# Patient Record
Sex: Female | Born: 1970 | Race: White | Hispanic: No | Marital: Married | Smoking: Former smoker
Health system: Southern US, Community
[De-identification: ages and names within clinical notes are randomized; demographics above are authoritative.]

## PROBLEM LIST (undated history)

## (undated) DIAGNOSIS — L409 Psoriasis, unspecified: Secondary | ICD-10-CM

## (undated) DIAGNOSIS — F419 Anxiety disorder, unspecified: Secondary | ICD-10-CM

## (undated) DIAGNOSIS — M797 Fibromyalgia: Secondary | ICD-10-CM

## (undated) DIAGNOSIS — J45909 Unspecified asthma, uncomplicated: Secondary | ICD-10-CM

## (undated) DIAGNOSIS — L309 Dermatitis, unspecified: Secondary | ICD-10-CM

## (undated) DIAGNOSIS — J302 Other seasonal allergic rhinitis: Secondary | ICD-10-CM

## (undated) DIAGNOSIS — M542 Cervicalgia: Secondary | ICD-10-CM

## (undated) DIAGNOSIS — R112 Nausea with vomiting, unspecified: Secondary | ICD-10-CM

## (undated) DIAGNOSIS — I499 Cardiac arrhythmia, unspecified: Secondary | ICD-10-CM

## (undated) DIAGNOSIS — R519 Headache, unspecified: Secondary | ICD-10-CM

## (undated) DIAGNOSIS — Z9889 Other specified postprocedural states: Secondary | ICD-10-CM

## (undated) DIAGNOSIS — F32A Depression, unspecified: Secondary | ICD-10-CM

## (undated) DIAGNOSIS — R1310 Dysphagia, unspecified: Secondary | ICD-10-CM

## (undated) DIAGNOSIS — M818 Other osteoporosis without current pathological fracture: Secondary | ICD-10-CM

## (undated) DIAGNOSIS — K219 Gastro-esophageal reflux disease without esophagitis: Secondary | ICD-10-CM

## (undated) DIAGNOSIS — G4730 Sleep apnea, unspecified: Secondary | ICD-10-CM

## (undated) DIAGNOSIS — F909 Attention-deficit hyperactivity disorder, unspecified type: Secondary | ICD-10-CM

## (undated) DIAGNOSIS — D649 Anemia, unspecified: Secondary | ICD-10-CM

## (undated) DIAGNOSIS — R12 Heartburn: Secondary | ICD-10-CM

## (undated) DIAGNOSIS — D219 Benign neoplasm of connective and other soft tissue, unspecified: Secondary | ICD-10-CM

## (undated) DIAGNOSIS — M5481 Occipital neuralgia: Secondary | ICD-10-CM

## (undated) DIAGNOSIS — G43909 Migraine, unspecified, not intractable, without status migrainosus: Secondary | ICD-10-CM

## (undated) DIAGNOSIS — G901 Familial dysautonomia [Riley-Day]: Secondary | ICD-10-CM

## (undated) DIAGNOSIS — C4491 Basal cell carcinoma of skin, unspecified: Secondary | ICD-10-CM

## (undated) DIAGNOSIS — E785 Hyperlipidemia, unspecified: Secondary | ICD-10-CM

## (undated) DIAGNOSIS — M810 Age-related osteoporosis without current pathological fracture: Secondary | ICD-10-CM

## (undated) DIAGNOSIS — M1990 Unspecified osteoarthritis, unspecified site: Secondary | ICD-10-CM

## (undated) DIAGNOSIS — Z1371 Encounter for nonprocreative screening for genetic disease carrier status: Secondary | ICD-10-CM

## (undated) DIAGNOSIS — Z800 Family history of malignant neoplasm of digestive organs: Secondary | ICD-10-CM

## (undated) DIAGNOSIS — G609 Hereditary and idiopathic neuropathy, unspecified: Secondary | ICD-10-CM

## (undated) DIAGNOSIS — C189 Malignant neoplasm of colon, unspecified: Secondary | ICD-10-CM

## (undated) DIAGNOSIS — T7840XA Allergy, unspecified, initial encounter: Secondary | ICD-10-CM

## (undated) DIAGNOSIS — D8940 Mast cell activation, unspecified: Secondary | ICD-10-CM

## (undated) DIAGNOSIS — Q7962 Hypermobile Ehlers-Danlos syndrome: Secondary | ICD-10-CM

## (undated) HISTORY — PX: BREAST SURGERY: SHX581

## (undated) HISTORY — DX: Sleep apnea, unspecified: G47.30

## (undated) HISTORY — DX: Unspecified asthma, uncomplicated: J45.909

## (undated) HISTORY — DX: Cervicalgia: M54.2

## (undated) HISTORY — DX: Anxiety disorder, unspecified: F41.9

## (undated) HISTORY — DX: Allergy, unspecified, initial encounter: T78.40XA

## (undated) HISTORY — DX: Family history of malignant neoplasm of digestive organs: Z80.0

## (undated) HISTORY — DX: Encounter for nonprocreative screening for genetic disease carrier status: Z13.71

## (undated) HISTORY — DX: Malignant neoplasm of colon, unspecified: C18.9

## (undated) HISTORY — PX: ENDOMETRIAL ABLATION: SHX621

## (undated) HISTORY — DX: Migraine, unspecified, not intractable, without status migrainosus: G43.909

## (undated) HISTORY — DX: Mast cell activation, unspecified: D89.40

## (undated) HISTORY — DX: Anemia, unspecified: D64.9

## (undated) HISTORY — DX: Hyperlipidemia, unspecified: E78.5

## (undated) HISTORY — DX: Basal cell carcinoma of skin, unspecified: C44.91

## (undated) HISTORY — DX: Unspecified osteoarthritis, unspecified site: M19.90

## (undated) HISTORY — DX: Age-related osteoporosis without current pathological fracture: M81.0

## (undated) HISTORY — PX: APPENDECTOMY: SHX54

## (undated) HISTORY — DX: Familial dysautonomia (riley-day): G90.1

## (undated) HISTORY — DX: Depression, unspecified: F32.A

## (undated) HISTORY — DX: Benign neoplasm of connective and other soft tissue, unspecified: D21.9

## (undated) HISTORY — DX: Gastro-esophageal reflux disease without esophagitis: K21.9

## (undated) HISTORY — DX: Hypermobile Ehlers-Danlos syndrome: Q79.62

## (undated) HISTORY — DX: Hereditary and idiopathic neuropathy, unspecified: G60.9

## (undated) HISTORY — PX: OTHER SURGICAL HISTORY: SHX169

## (undated) HISTORY — DX: Occipital neuralgia: M54.81

## (undated) HISTORY — DX: Other osteoporosis without current pathological fracture: M81.8

## (undated) HISTORY — DX: Nausea with vomiting, unspecified: R11.2

## (undated) HISTORY — DX: Attention-deficit hyperactivity disorder, unspecified type: F90.9

## (undated) HISTORY — PX: APPENDECTOMY (OPEN): SHX54

## (undated) HISTORY — DX: Other seasonal allergic rhinitis: J30.2

## (undated) HISTORY — DX: Fibromyalgia: M79.7

---

## 1988-09-15 DIAGNOSIS — J45909 Unspecified asthma, uncomplicated: Secondary | ICD-10-CM | POA: Insufficient documentation

## 1988-09-15 DIAGNOSIS — R0600 Dyspnea, unspecified: Secondary | ICD-10-CM | POA: Insufficient documentation

## 1998-09-15 DIAGNOSIS — M791 Myalgia, unspecified site: Secondary | ICD-10-CM | POA: Insufficient documentation

## 1998-09-15 DIAGNOSIS — M2550 Pain in unspecified joint: Secondary | ICD-10-CM | POA: Insufficient documentation

## 2000-09-15 DIAGNOSIS — R5383 Other fatigue: Secondary | ICD-10-CM | POA: Insufficient documentation

## 2000-09-15 DIAGNOSIS — R5382 Chronic fatigue, unspecified: Secondary | ICD-10-CM | POA: Insufficient documentation

## 2000-10-14 ENCOUNTER — Other Ambulatory Visit (HOSPITAL_COMMUNITY): Admission: RE | Admit: 2000-10-14 | Payer: 59 | Source: Ambulatory Visit

## 2003-09-16 DIAGNOSIS — I498 Other specified cardiac arrhythmias: Secondary | ICD-10-CM | POA: Insufficient documentation

## 2003-09-16 DIAGNOSIS — G90A Postural orthostatic tachycardia syndrome (POTS): Secondary | ICD-10-CM | POA: Insufficient documentation

## 2004-09-15 HISTORY — PX: REDUCTION MAMMAPLASTY: SUR839

## 2005-09-15 DIAGNOSIS — C4490 Unspecified malignant neoplasm of skin, unspecified: Secondary | ICD-10-CM

## 2005-09-15 DIAGNOSIS — M25619 Stiffness of unspecified shoulder, not elsewhere classified: Secondary | ICD-10-CM | POA: Insufficient documentation

## 2005-09-15 DIAGNOSIS — M719 Bursopathy, unspecified: Secondary | ICD-10-CM | POA: Insufficient documentation

## 2005-09-15 HISTORY — DX: Unspecified malignant neoplasm of skin, unspecified: C44.90

## 2006-09-03 DIAGNOSIS — C4491 Basal cell carcinoma of skin, unspecified: Secondary | ICD-10-CM

## 2006-09-03 HISTORY — DX: Basal cell carcinoma of skin, unspecified: C44.91

## 2006-09-15 DIAGNOSIS — M3500 Sicca syndrome, unspecified: Secondary | ICD-10-CM | POA: Insufficient documentation

## 2006-09-15 HISTORY — PX: COLON SURGERY: SHX602

## 2007-09-16 DIAGNOSIS — C189 Malignant neoplasm of colon, unspecified: Secondary | ICD-10-CM

## 2007-09-16 HISTORY — DX: Malignant neoplasm of colon, unspecified: C18.9

## 2007-09-16 HISTORY — PX: COLECTOMY: SHX59

## 2009-05-28 DIAGNOSIS — D239 Other benign neoplasm of skin, unspecified: Secondary | ICD-10-CM

## 2009-05-28 HISTORY — DX: Other benign neoplasm of skin, unspecified: D23.9

## 2009-09-15 HISTORY — PX: OTHER SURGICAL HISTORY: SHX169

## 2012-09-15 HISTORY — PX: COLONOSCOPY: SHX174

## 2012-09-15 HISTORY — PX: EGD: SHX3789

## 2013-09-15 DIAGNOSIS — Z1371 Encounter for nonprocreative screening for genetic disease carrier status: Secondary | ICD-10-CM

## 2013-09-15 HISTORY — DX: Encounter for nonprocreative screening for genetic disease carrier status: Z13.71

## 2014-09-19 ENCOUNTER — Encounter (INDEPENDENT_AMBULATORY_CARE_PROVIDER_SITE_OTHER): Payer: Self-pay

## 2014-09-22 ENCOUNTER — Ambulatory Visit (INDEPENDENT_AMBULATORY_CARE_PROVIDER_SITE_OTHER): Payer: Self-pay | Admitting: Hematology & Oncology

## 2014-09-25 ENCOUNTER — Encounter (INDEPENDENT_AMBULATORY_CARE_PROVIDER_SITE_OTHER): Payer: Self-pay

## 2014-09-27 ENCOUNTER — Ambulatory Visit (INDEPENDENT_AMBULATORY_CARE_PROVIDER_SITE_OTHER): Payer: 59 | Admitting: Hematology & Oncology

## 2014-09-27 ENCOUNTER — Telehealth (INDEPENDENT_AMBULATORY_CARE_PROVIDER_SITE_OTHER): Payer: Self-pay | Admitting: Hematology & Oncology

## 2014-09-27 ENCOUNTER — Encounter (INDEPENDENT_AMBULATORY_CARE_PROVIDER_SITE_OTHER): Payer: Self-pay | Admitting: Hematology & Oncology

## 2014-09-27 VITALS — BP 118/70 | HR 84 | Temp 97.5°F | Ht 62.0 in | Wt 115.0 lb

## 2014-09-27 DIAGNOSIS — C182 Malignant neoplasm of ascending colon: Secondary | ICD-10-CM

## 2014-09-27 DIAGNOSIS — D500 Iron deficiency anemia secondary to blood loss (chronic): Secondary | ICD-10-CM

## 2014-09-27 DIAGNOSIS — Z9189 Other specified personal risk factors, not elsewhere classified: Secondary | ICD-10-CM

## 2014-09-27 DIAGNOSIS — C189 Malignant neoplasm of colon, unspecified: Secondary | ICD-10-CM | POA: Insufficient documentation

## 2014-09-27 DIAGNOSIS — Z85038 Personal history of other malignant neoplasm of large intestine: Secondary | ICD-10-CM | POA: Insufficient documentation

## 2014-09-27 NOTE — Progress Notes (Signed)
Subjective:       Patient ID: Teresa [name] is a 44 y.o. female.    HPI  44 year old female with a history of T3N0M0 colon cancer and a strong family history of colon cancer and basal cell cancer presents for follow up.  She was diagnosed with colon cancer in 2009 in [place]. 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 She had 0/34 positive lymph nodes. She received 12 cycles of adjuvant chemotherapy with 5-FU/Leucovorin.    Her microsatellite IHC was normal. Her father died of colon cancer at 40, and her only brother had polyps. She had an Ambrey colon cancer panel that was reportedly normal other than a POLB Leu259Ser.    She is followed for iron deficiency anemia of unclear origin. She had taken prophylactic aspirin for a time, but discontinued this around 2014.    Her recurrence screening included Q2 year MRI as she refused CT scans.    She also has a history of basal cell cancer x2. Her mother has a history of basal cell cancer as well. She has had benign uterine polyps.    She takes multiple supplements (scanned into EPIC) that include Curcumin, ish Oil, I-theanine, I-lysine, Probioatics, Digestive enzymes.    PMH:  Allergies/seasonal  ADHD  Depression  Colon cancer  Basal cell cancer        Review of Systems  General: No acute distress  HEENT: no thrush seen. No oropharyngeal masses.  No lymphadenopathy in neck appreciated.  Lungs clear to auscultation B/L.  Regular Rate and rhythm.  Abdomen soft, non-tender, non-distended.  No clubbing, cyanosis, or edema.  Neuro exam without focal weakness.      Objective:    Physical Exam   BP 118/70 mmHg  Pulse 84  Temp(Src) 97.5 F (36.4 C) (Tympanic)  Ht 1.575 m (5\' 2" )  Wt 52.164 kg (115 lb)  BMI 21.03 kg/m2  SpO2 98%  General: No acute distress  HEENT: no thrush seen. No oropharyngeal masses.  No lymphadenopathy in neck appreciated.  Lungs clear to auscultation B/L.  Regular Rate  and rhythm.  Abdomen soft, non-tender, non-distended.  No clubbing, cyanosis, or edema.  Neuro exam without focal weakness.      Assessment:       44 year old female with a history of right sided T3N0 colon cancer in 2009, and with a strong family history. She tested negative for Lynch Syndrome. She has no evidence of disease at this time.      Plan:       Given the very strong family history and her early presentation, would recommend Lynch-like screening.  -CEA, CA 125 yearly.  -Colonoscopy Q2 years (referral to [facility]).  -Pelvic U/S and GYN exam yearly (referred to Dr. [name]).  -Q6 follow up with me for now.  -For anemia work up, Iron profile, CBC, B12/folate.   -We will try to retrieve her Ambrey test. I will speak to our genetics department about her situation.  -I don't think there is an indication for ongoing body MRI. As she explains it to me, she was replacing her typical annual CT screening for recurrence with MRI due to personal preference. Given that her diagnosis was >5 years ago, I don't think further body imaging is indicated in the absence of symptoms.Marland Kitchen

## 2014-09-27 NOTE — Telephone Encounter (Signed)
In the patient reg.form pt listed Dr Cheree Ditto, former oncologist (details are on the scanned patient reg.form) , but there is a comment that says to not send records, she provided the details just in case Dr Lady Gary had questions. Therefore, I am not adding the Dr in her registration but I am writing this note.

## 2014-09-28 ENCOUNTER — Encounter (INDEPENDENT_AMBULATORY_CARE_PROVIDER_SITE_OTHER): Payer: Self-pay

## 2014-10-02 ENCOUNTER — Encounter (INDEPENDENT_AMBULATORY_CARE_PROVIDER_SITE_OTHER): Payer: Self-pay

## 2014-10-03 ENCOUNTER — Telehealth (INDEPENDENT_AMBULATORY_CARE_PROVIDER_SITE_OTHER): Payer: Self-pay

## 2014-10-03 NOTE — Telephone Encounter (Signed)
TC from pt re: labs results. Was particularly interested in the genetic testing . Informed pt that it was negative. Pt appreciated the normal results

## 2014-10-25 ENCOUNTER — Encounter (INDEPENDENT_AMBULATORY_CARE_PROVIDER_SITE_OTHER): Payer: Self-pay | Admitting: Hematology & Oncology

## 2014-10-30 ENCOUNTER — Ambulatory Visit (INDEPENDENT_AMBULATORY_CARE_PROVIDER_SITE_OTHER): Payer: 59 | Admitting: Endocrinology, Diabetes and Metabolism

## 2014-10-30 ENCOUNTER — Encounter: Payer: Self-pay | Admitting: Endocrinology, Diabetes and Metabolism

## 2014-10-30 VITALS — BP 111/73 | HR 78 | Temp 97.8°F | Ht 62.0 in | Wt 117.4 lb

## 2014-10-30 DIAGNOSIS — R739 Hyperglycemia, unspecified: Secondary | ICD-10-CM

## 2014-10-30 MED ORDER — CVS LANCETS MICRO THIN 33G MISC
Status: DC
Start: 2014-10-30 — End: 2015-10-29

## 2014-10-30 NOTE — Progress Notes (Signed)
New visit.    Teresa [name] is a 44 year old female who is referred by her oncologist, Dr. Lady Gary, for elevated blood glucose. I reviewed the results of her labs.     The patient has concerned about the risk of developing diabetes mellitus. Her mother has pre-diabetes and there is diabetes mellitus, type 2 on her mother side. On her father's side there is type 1 diabetes.    The patient has no personal H/O DM or gestational diabetes (GDM).    Her PCP has been monitoring her hemoglobin A1c for the last year or so. Results are reported to be:  5.7 in July 2015  5.9 in October 2015   5.4 on October 14, 3014    She has already made dietary changes and reduced her carbohydrate portions. She is physically active and her weight has remained stable. She has also been using some supplements such as cinnamon and chromium.    She has a main H/O colon cancer. She is S/P partial colectomy in 2009 followed by chemotherapy. She is tumor free at this point. Her father had colon cancer.     Past Medical History   Diagnosis Date   . Anxiety    . Anemia    . Depression    . ADHD (attention deficit hyperactivity disorder)      No past surgical history on file.  Family History   Problem Relation Age of Onset   . Colon cancer Father      Allergies   Allergen Reactions   . Morphine Shortness Of Breath   . Codeine      Suppress breathing     History     Social History   . Marital Status: Married     Spouse Name: N/A     Number of Children: N/A   . Years of Education: N/A     Occupational History   . Not on file.     Social History Main Topics   . Smoking status: Never Smoker    . Smokeless tobacco: Not on file   . Alcohol Use: Not on file   . Drug Use: Not on file   . Sexual Activity: Not on file     Other Topics Concern   . Not on file     Social History Narrative     No current outpatient prescriptions on file prior to visit.     No current facility-administered medications on file prior to visit.     Filed Vitals:    10/30/14 0920   BP:  111/73   Pulse: 78   Temp: 97.8 F (36.6 C)   SpO2: 97%     Review of Systems   Constitutional: Negative.    HENT: Negative.    Eyes: Negative.    Respiratory: Negative.    Cardiovascular: Negative.    Gastrointestinal: Positive for diarrhea.   Genitourinary: Negative.    Musculoskeletal: Negative.    Skin: Negative.    Neurological: Negative.    Endo/Heme/Allergies: Negative.    Psychiatric/Behavioral: Negative.      Physical Exam   Constitutional: She is oriented to person, place, and time and well-developed, well-nourished, and in no distress. No distress.   HENT:   Head: Normocephalic and atraumatic.   Mouth/Throat: No oropharyngeal exudate.   Eyes: Conjunctivae and EOM are normal. Pupils are equal, round, and reactive to light.   Neck: No thyromegaly present.   Cardiovascular: Normal rate, regular rhythm, normal heart sounds and intact  distal pulses.    Pulmonary/Chest: Effort normal and breath sounds normal. No respiratory distress. She has no wheezes. She has no rales.   Abdominal: Soft. She exhibits no mass. There is no tenderness. There is no rebound and no guarding.   Scar of laparotomy   Musculoskeletal: Normal range of motion. She exhibits no edema.   Lymphadenopathy:     She has no cervical adenopathy.   Neurological: She is alert and oriented to person, place, and time. She has normal reflexes.   Skin: Skin is warm. She is not diaphoretic.   Psychiatric: Affect and judgment normal.     Assessment:    Mild hyperglycemia in a patient with strong family H/O diabetes (both type 1 and type 2). She does not have obesity and is active physically. She is motivated to keep her blood glucose under control and has been making dietary changes in order to keep her A1C under control and prevent the progression to diabetes.    Plan:    I gave her a glucometer and test strips and asked her to monitor her blood glucose three times weekly, alternating fasting with post prandial measurements. We discussed the importance  of continuing with lifestyle efforts and avoid weight gain. So far, she has been doing a great job with that.  Check the A1c every 3 to 4 months (she will have the next one at her PCP's office).  I will see her in 3 months after her next A1c.    Elijio Miles.H.Tamera Punt, MD  [facility]  [address]  [place], [place] [address]  Tel: [phone]  Fax: [phone]    [address]  [place], [place] [address]  Tel: [phone]  Fax: [phone]

## 2014-10-31 ENCOUNTER — Encounter (INDEPENDENT_AMBULATORY_CARE_PROVIDER_SITE_OTHER): Payer: Self-pay

## 2014-11-09 ENCOUNTER — Ambulatory Visit: Payer: 59

## 2014-11-09 NOTE — Pre-Procedure Instructions (Signed)
   No pre op BW ordered/required.   Pt able to state time of procedure/arrival time/location and NPO instructions.

## 2014-11-13 ENCOUNTER — Ambulatory Visit: Payer: 59 | Admitting: Gastroenterology

## 2014-11-13 ENCOUNTER — Ambulatory Visit: Payer: 59 | Admitting: Anesthesiology

## 2014-11-13 ENCOUNTER — Ambulatory Visit
Admission: RE | Admit: 2014-11-13 | Discharge: 2014-11-13 | Disposition: A | Discharge status: Home / Self Care | Payer: 59 | Source: Ambulatory Visit | Attending: Gastroenterology | Admitting: Gastroenterology

## 2014-11-13 ENCOUNTER — Encounter
Admission: RE | Disposition: A | Discharge status: Home / Self Care | Payer: Self-pay | Source: Ambulatory Visit | Attending: Gastroenterology

## 2014-11-13 ENCOUNTER — Ambulatory Visit: Payer: Self-pay

## 2014-11-13 DIAGNOSIS — Z1211 Encounter for screening for malignant neoplasm of colon: Secondary | ICD-10-CM | POA: Insufficient documentation

## 2014-11-13 DIAGNOSIS — J45909 Unspecified asthma, uncomplicated: Secondary | ICD-10-CM | POA: Insufficient documentation

## 2014-11-13 DIAGNOSIS — R739 Hyperglycemia, unspecified: Secondary | ICD-10-CM | POA: Insufficient documentation

## 2014-11-13 DIAGNOSIS — K648 Other hemorrhoids: Secondary | ICD-10-CM | POA: Insufficient documentation

## 2014-11-13 DIAGNOSIS — G4730 Sleep apnea, unspecified: Secondary | ICD-10-CM | POA: Insufficient documentation

## 2014-11-13 DIAGNOSIS — Z85038 Personal history of other malignant neoplasm of large intestine: Secondary | ICD-10-CM | POA: Insufficient documentation

## 2014-11-13 DIAGNOSIS — K317 Polyp of stomach and duodenum: Secondary | ICD-10-CM | POA: Insufficient documentation

## 2014-11-13 DIAGNOSIS — K319 Disease of stomach and duodenum, unspecified: Secondary | ICD-10-CM | POA: Insufficient documentation

## 2014-11-13 DIAGNOSIS — D649 Anemia, unspecified: Secondary | ICD-10-CM | POA: Insufficient documentation

## 2014-11-13 HISTORY — DX: Cardiac arrhythmia, unspecified: I49.9

## 2014-11-13 HISTORY — PX: COLONOSCOPY: SHX174

## 2014-11-13 HISTORY — DX: Psoriasis, unspecified: L40.9

## 2014-11-13 HISTORY — DX: Sleep apnea, unspecified: G47.30

## 2014-11-13 HISTORY — DX: Unspecified asthma, uncomplicated: J45.909

## 2014-11-13 HISTORY — DX: Headache, unspecified: R51.9

## 2014-11-13 HISTORY — DX: Dysphagia, unspecified: R13.10

## 2014-11-13 HISTORY — DX: Dermatitis, unspecified: L30.9

## 2014-11-13 HISTORY — PX: EGD, DILATION: SHX3804

## 2014-11-13 HISTORY — DX: Heartburn: R12

## 2014-11-13 HISTORY — DX: Other specified postprocedural states: Z98.890

## 2014-11-13 SURGERY — ESOPHAGOGASTRODUODENOSCOPY (EGD), ESOPHAGEAL DILATION
Anesthesia: Anesthesia General | Site: Anus

## 2014-11-13 MED ORDER — GLYCOPYRROLATE 0.2 MG/ML IJ SOLN
INTRAMUSCULAR | Status: AC
Start: 2014-11-13 — End: ?
  Filled 2014-11-13: qty 1

## 2014-11-13 MED ORDER — ONDANSETRON HCL 4 MG/2ML IJ SOLN
INTRAMUSCULAR | Status: AC
Start: 2014-11-13 — End: ?
  Filled 2014-11-13: qty 2

## 2014-11-13 MED ORDER — LIDOCAINE HCL (PF) 2 % IJ SOLN
INTRAMUSCULAR | Status: AC
Start: 2014-11-13 — End: ?
  Filled 2014-11-13: qty 5

## 2014-11-13 MED ORDER — PROPOFOL 10 MG/ML IV EMUL
INTRAVENOUS | Status: AC
Start: 2014-11-13 — End: ?
  Filled 2014-11-13: qty 50

## 2014-11-13 MED ORDER — GLYCOPYRROLATE 0.2 MG/ML IJ SOLN
INTRAMUSCULAR | Status: DC | PRN
Start: 2014-11-13 — End: 2014-11-13
  Administered 2014-11-13: 0.2 mg via INTRAVENOUS

## 2014-11-13 MED ORDER — LIDOCAINE HCL 2 % IJ SOLN
INTRAMUSCULAR | Status: DC | PRN
Start: 2014-11-13 — End: 2014-11-13
  Administered 2014-11-13: 40 mg

## 2014-11-13 MED ORDER — PROPOFOL INFUSION 10 MG/ML
INTRAVENOUS | Status: DC | PRN
Start: 2014-11-13 — End: 2014-11-13
  Administered 2014-11-13: 160 ug/kg/min via INTRAVENOUS

## 2014-11-13 MED ORDER — LACTATED RINGERS IV SOLN
INTRAVENOUS | Status: DC
Start: 2014-11-13 — End: 2014-11-13

## 2014-11-13 MED ORDER — PROPOFOL INFUSION 10 MG/ML
INTRAVENOUS | Status: DC | PRN
Start: 2014-11-13 — End: 2014-11-13
  Administered 2014-11-13: 100 mg via INTRAVENOUS
  Administered 2014-11-13: 20 mg via INTRAVENOUS

## 2014-11-13 MED ORDER — ONDANSETRON HCL 4 MG/2ML IJ SOLN
INTRAMUSCULAR | Status: DC | PRN
Start: 2014-11-13 — End: 2014-11-13
  Administered 2014-11-13: 4 mg via INTRAVENOUS

## 2014-11-13 MED ORDER — LACTATED RINGERS IV SOLN
INTRAVENOUS | Status: DC | PRN
Start: 2014-11-13 — End: 2014-11-13

## 2014-11-13 SURGICAL SUPPLY — 29 items
CAP BIOPSY (Procedure Accessories) ×1
CONTAINER HISTOLOGY 60 ML 30 ML GRADUATE LEAK RESISTANT O RING PREFILL (Procedure Accessories) IMPLANT
CONTAINER SPEC LLDPE 16OZ W LF NS LEK (Procedure Accessories) ×4
CONTAINER SPECIMAN 16OZ W LID (Procedure Accessories) ×2
CONTAINER SPECIMEN C16 OZ WIDE LEAK SHATTER RESISTANT SNAP ON LID (Procedure Accessories) ×4 IMPLANT
DEVICE ENDOSCOPIC RAPID EXCHANGE BIOPSY (Procedure Accessories) ×2
DEVICE ENDOSCOPIC RAPID EXCHANGE BIOPSY CAP OLYMPUS (Procedure Accessories) ×2 IMPLANT
FORCEP BIOPSY 240CM RADIALJAW (Instrument) ×3 IMPLANT
GLOVE NITRILE PREMIERPRO MED (Glove) ×9 IMPLANT
GLOVE SURG BIOGEL M SZ8.5 (Glove) ×1
GLOVE SURGICAL 8.5 BIOGEL M POWDER FREE (Glove) ×2
GLOVE SURGICAL 8.5 BIOGEL M POWDER FREE TEXTURE BEAD CUFF NONPYROGENIC (Glove) ×2 IMPLANT
GOWN CP ELSTC WRIST REG/LG BL (Gown) ×1
GOWN ISL PP PE REG LG LF FULL BCK NK TIE (Gown) ×2
GOWN ISOLATION REGULAR LARGE FULL BACK NECK TIE ELASTIC CUFF (Gown) ×2 IMPLANT
JELLY KY LUBRICATNG 2 OZ FLIP (Procedure Accessories) ×3 IMPLANT
SOL FORMALIN 10% PREFILL 30ML (Procedure Accessories) ×3
SOL WATER STERILE 1000CC BTLE (Irrigation Solutions) ×2
SPONGE GAUZE L4 IN X W4 IN 4 PLY HIGH (Sponge) ×2
SPONGE GAUZE L4 IN X W4 IN 4 PLY NONWOVEN LINT FREE CURITY RAYON (Sponge) ×2 IMPLANT
SPONGE GAUZE VERSA 4PLY 4X4 (Sponge) ×1
SYRINGE 50 ML GRADUATE NONPYROGENIC DEHP (Syringes, Needles) ×2
SYRINGE 50 ML GRADUATE NONPYROGENIC DEHP FREE PVC FREE BD MEDICAL (Syringes, Needles) ×2 IMPLANT
SYRINGE SLIP-TIP 60CC (Syringes, Needles) ×1
TUBING CONNECTING STERILE 10FT (Tubing) ×1
TUBING SUCTION ID3/16 IN L10 FT (Tubing) ×2
TUBING SUCTION ID3/16 IN L10 FT NONCONDUCTIVE STRAIGHT MALE FEMALE (Tubing) ×2 IMPLANT
WATER STERILE PLASTIC POUR BOTTLE 1000 (Irrigation Solutions) ×4
WATER STERILE PLASTIC POUR BOTTLE 1000 ML (Irrigation Solutions) ×4 IMPLANT

## 2014-11-13 NOTE — Transfer of Care (Signed)
Pt. mildly sedated. Breathing well. Vital signs stable, see PACU  nursing flow sheet. Detailed report given to PACU RN.

## 2014-11-13 NOTE — Anesthesia Postprocedure Evaluation (Signed)
Anesthesia Post Evaluation    Patient: Teresa [name]    Procedure(s) with comments:  EGD, DILATION - EGD, DILATION/COLONOSCOPY W/IVA  COLONOSCOPY    Anesthesia type: general    Last Vitals:   Filed Vitals:    11/13/14 0933   BP: 102/62   Pulse: 79   Resp: 20   SpO2: 100%       Patient Location: Phase II PACU      Post Pain: Patient not complaining of pain, continue current therapy    Mental Status: awake    Respiratory Function: tolerating room air    Cardiovascular: stable    Nausea/Vomiting: patient not complaining of nausea or vomiting    Hydration Status: adequate    Post Assessment: no apparent anesthetic complications, no reportable events and no evidence of recall          Anesthesia Qualified Clinical Data Registry    Central Line      CVC insertion : NO                                               Perioperative temperature management      General/neuraxial anesthesia > or = 60 minutes (excluding CABG) : NO                                Administration of antibiotic prophylaxis      Age > or = 18, with IV access, with surgical procedure for which antibiotic prophylaxis indicated, and not on chronic antibiotics : NO                  Medication Administration      Ordering or administration of drug inconsistent with intended drug, dose, delivery or timing : NO      Dental loss/damage      Dental injury with administration of anesthesia : NO      Difficult intubation due to unrecognized difficult airway        Elective airway procedure including but not limited to: tracheostomy, fiberoptic bronchoscopy, rigid bronchoscopy; jet ventilation; or elective use of a device to facilitate airway management such as a Glidescope : NO                > Unanticipated difficult intubation post pre-evaluation : NO      Aspiration of gastric contents        Aspiration of gastric contents : NO                    Surgical fire        Procedure requiring electrocautery/laser : NO                    Immediate  perioperative cardiac arrest        Cardiac arrest in OR or PACU : NO                    Unplanned hospital admission        Unplanned hospital admission for initially intended outpatient anesthesia service : NO      Unplanned ICU admission        Unplanned ICU admission related to anesthesia occurring within 24 hours of induction or start of MAC : NO  Surgical case cancellation        Cancellation of procedure after care already started by anesthesia care team : NO      Post-anesthesia transfer of care checklist/protocol to PACU        Transfer from OR to PACU upon case conclusion : YES              > Use of PACU transfer checklist/protocol : YES     (Includes the key elements of: patient identification, responsible practitioner identification (PACU nurse or advanced practitioner), discussion of pertinent history and procedure course, intraoperative anesthetic management, post-procedure plans, acknowledgement/questions)    Post-anesthesia transfer of care checklist/protocol to ICU        Transfer from OR to ICU upon case conclusion : NO                    Post-operative nausea/vomiting risk protocol        Post-operative nausea/vomiting risk protocol : NO  Patient > or = 18 with care initiated by anesthesia team that has a risk factor screen for post-op nausea/vomiting (Includes female, hx PONV, or motion sickness, non-smoker, intended opioid administration for post-op analgesia.)    Anaphylaxis        Anaphylaxis during anesthesia services : NO    (Inclusive of any suspected transfusion reaction in association with blood-bank confirmed blood product incompatibility)              Aspen Lawrance, 11/13/2014 9:39 AM

## 2014-11-13 NOTE — Anesthesia Preprocedure Evaluation (Signed)
Anesthesia Evaluation    AIRWAY    Mallampati: II    TM distance: >3 FB  Neck ROM: full  Mouth Opening:full   CARDIOVASCULAR    regular       DENTAL         PULMONARY    clear to auscultation     OTHER FINDINGS                    Anesthesia Plan    ASA 2     general                     Detailed anesthesia plan: general IV            informed consent obtained

## 2014-11-13 NOTE — H&P (Signed)
GI PRE PROCEDURE NOTE    Proceduralist Comments:   Review of Systems and Past Medical / Surgical History performed: Yes     Indications:Dysphagia and Hx colon cancer    Previous Adverse Reaction to Anesthesia or Sedation (if yes, describe): No    Physical Exam / Laboratory Data (If applicable)   Airway Classification: Class II    General: Alert and cooperative  Lungs: Lungs clear to auscultation  Cardiac: RRR, normal S1S2.    Abdomen: Soft, non tender. Normal active bowel sounds  Other:     Outside labs reviewed    American Society of Anesthesiologists (ASA) Physical Status Classification:   ASA 3 - Patient with moderate systemic disease with functional limitations    Planned Sedation:   Deep sedation with anesthesia    Attestation:   Kiriana Stapleford Llera has been reassessed immediately prior to the procedure and is an appropriate candidate for the planned sedation and procedure. Risks, benefits and alternatives to the planned procedure and sedation have been explained to the patient or guardian:  yes        Signed by: Huntley Estelle

## 2014-11-14 ENCOUNTER — Telehealth: Payer: Self-pay

## 2014-11-14 ENCOUNTER — Encounter: Payer: Self-pay | Admitting: Gastroenterology

## 2014-11-15 LAB — LAB USE ONLY - HISTORICAL SURGICAL PATHOLOGY

## 2015-01-22 ENCOUNTER — Ambulatory Visit: Payer: 59 | Admitting: Endocrinology, Diabetes and Metabolism

## 2015-02-06 ENCOUNTER — Inpatient Hospital Stay: Payer: 59 | Attending: Internal Medicine | Admitting: Physical Therapist

## 2015-02-06 VITALS — BP 120/80 | HR 70

## 2015-02-06 DIAGNOSIS — M545 Low back pain, unspecified: Secondary | ICD-10-CM

## 2015-02-06 NOTE — PT/OT Plan of Care (Signed)
Plan of Care / Updated Plan of Care IPTC Medicare Provider #: [phone]                Patient Name: Teresa [name]  MRN: [id number]  DOI: Onset of Problem / Injury: 01/19/15 DOS: N/A  SOC: 02/06/2015    Diagnosis:     ICD-10-CM    1. Midline low back pain without sciatica M54.5      ASSESSMENT: the patient is a 44 y.o. female presenting with low back pain who requires Physical Therapy for the following:  Impairments: mm weakness L hip abd, B hip flexors, tone in B piriformis and hip flexors, L quads and HS, low back pain,  L lateral shift in standing, shorter L leg, L ankle pain    Functional Limitations: back pain at end of the day, back pain when lifting heavy objects, R ankle pain with walking and stair negotiation.     Plan Of Care: Body Mechanics Education, NMR, Proprioceptive Activites, Electrical Stimulation, Instruction in HEP, Ultrasound, Therapeutic Exercise, Balance/Gait training and Soft Tissue/Joint Mobilization: DTM lumbar PVM, B gluts, B hip flexors.     Frequency/Duration: 2 times a week for 6 weeks. Anticipated D/C date: 03/15/15    Goals:  Date (Body Area, Impairment Goal, Functional   Activity, Target Performance) Time Frame Status Date/  Initial   02/06/2015   <5% oswestry   6 wks Initial Eval    02/06/2015   5/5 MMT L hip abd, quads and HS to be able to negotiate stairs without difficulty   6 wks Initial Eval    02/06/2015   <3/10 pain in back to be able to sit at desk for >4 hours   6 wks Initial Eval    02/06/2015   Pt will demo proper arch support and shoe wear to allow for normal gait pattern   6 wks Initial Eval    02/06/2015   Pt will be independent with all HEP  6 wks Initial Eval      Signature: Marylu Lund, [place], DPT [id number] Date: 02/06/2015    Signature: Rocky Link, MD ____________________________ Date:     Patient Name: Teresa [name]  MRN: [id number]

## 2015-02-06 NOTE — PT/OT Therapy Note (Signed)
INITIAL EVALUATION (Lumbar)    Name: Teresa [name] Age: 44 y.o. Occupation: desk job SOC: 02/06/2015  Referring Physician: Rocky Link, MD MD recheck: TBD DOS:   DOI: Onset of Problem / Injury: 01/19/15  # of Authorized Visits:   Visit #      Diagnosis (Treating/Medical):     ICD-10-CM    1. Midline low back pain without sciatica M54.5       SUBJECTIVE:     Mechanism of Injury: Has been having low back pain off and on for years. Recently is noticing pain in R ankle and thinks it has to do with her gait. Has ankle pain when negotiating stairs, when walking, and back hurts at the end of the day. Back pain present when lifting heavy objects    Patient's reason for seeking PT /Goal: to improve gait and to be able to walk >5 miles without pain.     Past Medical History:   Past Medical History   Diagnosis Date   . Anxiety    . Depression    . ADHD (attention deficit hyperactivity disorder)    . Post-operative nausea and vomiting    . Asthma    . Headache      migraine, sensitivity to light   . Malignant neoplasm of colon 2009     chemo   . Malignant neoplasm of skin 2007     basal   . Anemia      constant   . Psoriasis    . Eczema    . Heart burn    . Dysphagia    . Sleep apnea      does not use CPAP   . Arrhythmia      pt states she belives anemia led to heart flutters in the past.     Medications: No outpatient prescriptions have been marked as taking for the 02/06/15 encounter (Clinical Support) with Marylu Lund, PT.        Other Treatment/Prior Therapy: N/A  Prior Hospitalization: No  Hand Dominance: Dominant Hand: Right Involved Side: Involved Side: Bilateral   DiagnosticTests: NA    Outcome Measure:   Oswestry Score: 16% Pain Score: 33% Rate Satisfaction with Current Function: 6/10   PLOF: able to negotiate stairs and walk >8 miles without pain in back or ankle.     OBJECTIVE:    Vitals: BP: 120/80 mmHg Heart Rate: 70      Observation/Posture/Gait/Integumentary  Observation: B pes cavus, L lateral  shift in standing  Posture: upright posture,   Gait: slight L lateral trunk lean  Integumentary: No wound, lesion or rash noted    AROM: Lumbar Spine   Initial      Flexion WNL      Extension        R L R L   Rotation       Side Bending       (blank fields were intentionally left blank)    Range of Motion: (degrees)  Initial Right  AROM   Right  AROM Hip  WFL InitialLeft AROM   Left AROM     Flexion       Extension       Abduction       Adduction       Internal Rotation       External Rotation     (blank fields were intentionally left blank)    Repeated flexion/extension centralizes symptoms? N/A    STRENGTH   Lumbar  MMT /5    IE R IE L R L  IE R IE  L R  L   L1/2 Hip Flex 4 4+   Glute Medius 5 4     L3 Knee Ext 4+ 4    T.A. Activation fair      L4 Ankle DF 5 5   Hip ext 4 4     L5 Toe Ex 5 5          S1 Knee Flex 4+ 4+          (blank fields were intentionally left blank)    Functional Strength:   Sit to Stand: Independent  Squat:  Partial  Step-up: 8 inches  Heel Raises:  Bilateral: able    Unilateral: R: difficulty L: difficulty    Palpation: TTP B hip flexors, B ITBs, B piriformis    Joint Mobility Assessment: hypermobile lumbar spine     Sensation to Light touch: Intact    SI Special Tests:    R L   Compression (-) (-)   Distraction (-) (+)   March (-) (-)   Sacral thrust (-) (-)   Sitting PSIS heights (-) (-)   Standing Flexion (-) (+)   Supine long sit (-) (-)   Thigh thrust NT NT   Innominate Rotation (-) (-)      R L   SLR (-) (-)   Crossed SLR (-) (-)   Quadrant Test (-) (-)   [employer] (-) (-)   OBER Test (-) (-)   Slump (-) (-)   FABER (-) (+)   SAG Sign (-) (-)   Hamstring Flexibility (-) (-)   Prone Instability NT NT     Treatment Initial Visit:  Evaluation (15 min)  Patient Education on course of PT, POC, objective findings.   Therapeutic exercise none today.   Therapeutic Activity: discussed leg length discrepancy and benefit of using heel lift for proper alignment of pelvis. Discussed  lumbopelvic relationship and effect of mm weakness and tightness on production of pain. Encouraged pt to purchase proper shoes with support for arches to avoid gait abnormality: recommended consultation at [employer].   Manual: --  Modalities: None  Barriers to rehabilitation: None  Rehab Potential:excellent  Is patient aware of diagnosis: Yes  For Next Visit: begin light AROM and strengthening for LEs and core/lumbar spine. Issue HEP. Assess whether pt has proper shoes on. Try heel lift for Debe Coder, PT, DPT [id number]  02/06/2015  Time In/Out:  10:05am - 10:48am   Total Treatment Time:  40 mins

## 2015-02-07 ENCOUNTER — Inpatient Hospital Stay: Payer: 59 | Admitting: Rehabilitative and Restorative Service Providers"

## 2015-02-14 ENCOUNTER — Inpatient Hospital Stay: Payer: 59 | Admitting: Physical Therapist

## 2015-02-20 ENCOUNTER — Inpatient Hospital Stay: Payer: 59 | Attending: Internal Medicine | Admitting: Rehabilitative and Restorative Service Providers"

## 2015-02-20 DIAGNOSIS — M545 Low back pain, unspecified: Secondary | ICD-10-CM

## 2015-02-20 NOTE — PT/OT Exercise Plan (Signed)
Name: Teresa [name]  Referring Physician: Rocky Link, MD  Diagnosis:     ICD-10-CM    1. Midline low back pain without sciatica M54.5         Precautions:   Date of Surgery:    MD Follow-up:           Exercise Flow Sheet    Exercise Specifics 02/20/15 Date            rec bike    5 min  AZ               Setting the core with the pressure cuff ( marching, knees out, alternate knee flexion and extension)  1 min ea  AZ               bridging  X 20  HEP  AZ               Dead bug  X 10  HEP  AZ             clams    GTB  X 10 B  HEP  AZ               SKTC  2 x 10" B  HEP  AZ                                                                                                  Home Exercise Program                 (Initials = supervised exercise by clinician)

## 2015-02-20 NOTE — PT/OT Therapy Note (Signed)
DAILY NOTE   02/20/2015     Time In/Out: 5:02 - 5:59 Total Treatment Time: 47 min Visit Number:  2    # of Authorized Visits: 8 Visit #: 1      Diagnosis (Treating/Medical):     ICD-10-CM    1. Midline low back pain without sciatica M54.5            Subjective:  Teresa [name]'s pain is Intermittent and is rated a 1/10.  Functional Changes: reports that sitting for a long time is uncomfortable , at the end of the day has pain in her lower back area, stair negotiation is not too bad, pt reports having some neck pain    Objective:   Treatment:  Therapeutic Exercise: to improve: Stabilization   Assisted Warm-up with subjective taken  Modifications/Patient Education:  (Verbal, Visual and Tactile cues ) for added all the new exercises , see NMR     NMR:  Explanation of the core concept, identifying the core individually with separately contracting them , setting the core before different activities, using the pressure cuff per flow chart to ensure stability of the spine, cueing for setting the ore with dead bug , clams, and bridging     Therapeutic Activities:  -          Manual Therapy:   -      Current Measurements (ROM, Strength, Girth, Outcomes, etc.):   STRENGTH   Lumbar   MMT /5    IE R IE L R L  IE R IE L R L   L1/2 Hip Flex 4 4+   Glute Medius 5 4     L3 Knee Ext 4+ 4   T.A. Activation fair      L4 Ankle DF 5 5   Hip ext 4 4     L5 Toe Ex 5 5          S1 Knee Flex 4+ 4+          (blank fields were intentionally left blank)     No measurements today ( 02/20/15, 2nd session)  Modalities: Ice Pack 10 min. Location lower back Position Supine 90/90  Therapy Rationale: Other: avoid soreness        Assessment (response to treatment):   Core concept was explained and patient shows good understanding although has challenge with setting the multifidi, pressure cuff was used to ensure the stability of the spine, pt reports that has a hard time relaxing her neck while doing  the exercises but no pain and shows good mechanics after cueing , additional HEP was issued to improve stability of the spine,     Progress towards functional goals: as below   Goals:  Date (Body Area, Impairment Goal, Functional   Activity, Target Performance) Time Frame Status Date/  Initial   02/06/2015  <5% oswestry  6 wks Initial Eval    02/06/2015  5/5 MMT L hip abd, quads and HS to be able to negotiate stairs without difficulty  6 wks Initial Eval    02/06/2015  <3/10 pain in back to be able to sit at desk for >4 hours  6 wks Initial Eval    02/06/2015  Pt will demo proper arch support and shoe wear to allow for normal gait pattern  6 wks Initial Eval    02/06/2015  Pt will be independent with all HEP 6 wks Was issued   02/20/15  [place]          Patient  requires continued skilled care to: increase strength for sitting     Plan:  Continue with Plan of Care reassess respond to setting the core and HEP, progress stabilization     Derrek Gu, PT, [place] [address]  02/20/2015

## 2015-02-22 ENCOUNTER — Inpatient Hospital Stay: Payer: 59 | Admitting: Physical Therapist

## 2015-02-28 ENCOUNTER — Inpatient Hospital Stay: Payer: 59 | Attending: Internal Medicine | Admitting: Physical Therapist

## 2015-02-28 DIAGNOSIS — M545 Low back pain, unspecified: Secondary | ICD-10-CM

## 2015-02-28 NOTE — PT/OT Therapy Note (Signed)
DAILY NOTE   02/28/2015     Time In/Out: 4:00-4:55 pm Total Treatment Time: 45 min Visit Number:  3    # of Authorized Visits: 8 Visit #: 3      Diagnosis (Treating/Medical):     ICD-10-CM    1. Midline low back pain without sciatica M54.5            Subjective:  Kandas's pain is Intermittent and is rated a 1/10.  Functional Changes: reports that sitting for a long time is uncomfortable , at the end of the day has pain in her lower back. Pt reports having some neck pain    Objective:   Treatment:  Therapeutic Exercise: to improve: Stabilization   Assisted Warm-up with subjective taken  Modifications/Patient Education:  (Verbal, Visual and Tactile cues ) for added all the new exercises , see NMR   Added wall slides to 45 d with add and TrA cuing  NMR:  setting the core during different activities as reviewed and cueing for setting the core with dead bug , and bridging. Tried to educ in C-spine relaxation but pt unable with the supine TE's    Therapeutic Activities:  - log rolling demo'd and rationale explained. Supine C-R C-spine retraction with tactile feedback to focus on relaxation. Sleep position/pillow discussed    Manual Therapy:   -      Current Measurements (ROM, Strength, Girth, Outcomes, etc.):   STRENGTH   Lumbar   MMT /5    IE R IE L R L  IE R IE L R L   L1/2 Hip Flex 4 4+   Glute Medius 5 4     L3 Knee Ext 4+ 4   T.A. Activation fair      L4 Ankle DF 5 5   Hip ext 4 4     L5 Toe Ex 5 5          S1 Knee Flex 4+ 4+          (blank fields were intentionally left blank)       Modalities: Ice Pack 10 min. Location lower back Position Supine 90/90  Therapy Rationale: Other: avoid soreness        Assessment (response to treatment)  Core concept was reviewed and patient shows good understanding although she still is challenged with setting the multifidi. Pt reports that she has a hard time relaxing her neck while doing the exercises but no pain and  shows good mechanics after cueing.   Progress towards functional goals: as below   Goals:  Date (Body Area, Impairment Goal, Functional   Activity, Target Performance) Time Frame Status Date/  Initial   02/06/2015  <5% oswestry  6 wks Initial Eval    02/06/2015  5/5 MMT L hip abd, quads and HS to be able to negotiate stairs without difficulty  6 wks Initial Eval    02/06/2015  <3/10 pain in back to be able to sit at desk for >4 hours  6 wks Initial Eval    02/06/2015  Pt will demo proper arch support and shoe wear to allow for normal gait pattern  6 wks Initial Eval    02/06/2015  Pt will be independent with all HEP 6 wks Was issued   02/20/15  [place]          Patient requires continued skilled care to: increase strength for sitting     Plan:  Continue with Plan of Care  progress stabilization  Davis Gourd, PT, DPT  License # [place] [phone] 02/28/2015

## 2015-02-28 NOTE — PT/OT Exercise Plan (Signed)
Name: Teresa [name]  Referring Physician: Rocky Link, MD  Diagnosis:     ICD-10-CM    1. Midline low back pain without sciatica M54.5         Precautions:   Date of Surgery:    MD Follow-up:           Exercise Flow Sheet    Exercise Specifics 02/20/15 02/28/15            rec bike    5 min  [place] 4'  cj              Setting the core with the pressure cuff ( marching, knees out, alternate knee flexion and extension)  1 min ea  [place] Wall slides with add X 2'  cj              bridging  X 20  HEP  AZ P-ball X 2'  cj              Dead bug  X 10  HEP  AZ 2'  cj            clams    GTB  X 10 B  HEP  AZ -              SKTC  2 x 10" B  HEP  AZ                                                                                                  Home Exercise Program                 (Initials = supervised exercise by clinician)

## 2015-03-02 ENCOUNTER — Inpatient Hospital Stay: Payer: 59 | Admitting: Physical Therapist

## 2015-03-08 ENCOUNTER — Inpatient Hospital Stay: Payer: 59 | Admitting: Physical Therapist

## 2015-03-12 ENCOUNTER — Inpatient Hospital Stay: Payer: 59 | Attending: Internal Medicine | Admitting: Physical Therapist

## 2015-03-12 DIAGNOSIS — M545 Low back pain, unspecified: Secondary | ICD-10-CM

## 2015-03-12 NOTE — PT/OT Exercise Plan (Signed)
Name: Teresa [name]  Referring Physician: Rocky Link, MD  Diagnosis:     ICD-10-CM    1. Midline low back pain without sciatica M54.5       Precautions:   Date of Surgery:    MD Follow-up:         Exercise Flow Sheet    Exercise Specifics 02/20/15 02/28/15 03/12/15           rec bike    5 min  [place] 4'  cj -             Setting the core with the pressure cuff ( marching, knees out, alternate knee flexion and extension)  1 min ea  [place] Wall slides with add X 2'  cj              bridging  X 20  HEP  AZ P-ball X 2'  cj With p ball and TA, UEs crossed 2', 10" holds GJ             Dead bug  X 10  HEP  AZ 2'  cj With red ball to knees and TA 2' GJ           clams    GTB  X 10 B  HEP  AZ -              SKTC  2 x 10" B  HEP  AZ             Cervical retraction   With finning and horiz abd with GTB   3x10 each GJ           hooklying TA activation with shoulder horiz abd   GTB   3x10 GJ                                                              Home Exercise Program                 (Initials = supervised exercise by clinician)

## 2015-03-12 NOTE — PT/OT Therapy Note (Signed)
DAILY NOTE   03/12/2015     Time In/Out: 4:15pm - 5:00 pm Total Treatment Time: 45 min Visit Number:  4    # of Authorized Visits: 8 Visit #: 4    Diagnosis (Treating/Medical):     ICD-10-CM    1. Midline low back pain without sciatica M54.5        Subjective:  Luisana states she is feeling less pain in her back. Is doing her HEP. Thinks core muscles are getting stronger, but she still thinks her proprioception is off. Is having pain in her neck and thinks that a vertebra is out of alignment.     Objective:   Treatment:  Therapeutic Exercise: to improve: Stabilization   No warm up today.   Modifications/Patient Education:  Verbal, Environmental manager instruction for form with TE. Reviewed HEP and answered patient's questions regarding increasing activity level and advancing HEP. Discussed cervical pain and benefit of eval/treat of cervical spine.     NMR:  Use of p ball to facilitate proprioception in core. Tactile and verbal cues for proper engagement of TA with core strengthening exercises.     Therapeutic Activities: --    Manual Therapy:     PA and rotation mobs to thoracic and lumbar spine grade 3-4  Resisted hip abd/add, flex and ext for pelvic clearing.   MET for C6 R rotation      Current Measurements (ROM, Strength, Girth, Outcomes, etc.):   STRENGTH   Lumbar   MMT /5    IE R IE L 03/12/15  L/R   IE R IE L     L1/2 Hip Flex 4 4+ 4+/5  Glute Medius 5 4     L3 Knee Ext 4+ 4 5/5  T.A. Activation fair      L4 Ankle DF 5 5   Hip ext 4 4     L5 Toe Ex 5 5          S1 Knee Flex 4+ 4+ 5/5         (blank fields were intentionally left blank)     Modalities:none required.      Assessment (response to treatment)  Pt nearing end of course of PT for low back due to improvement in symptoms and compliance with HEP. Will benefit from eval/treat of cervical spine for treatment of pain associated with vertebral mal-alignment. Able to perform all TE without increase in  back pain. Demos increasing strength and activity tolerance with TE progression. Will benefit from 1-2 more sessions to advance TE and HEP and assess progress toward low back goals.     Progress towards functional goals: as below   Goals:  Date (Body Area, Impairment Goal, Functional   Activity, Target Performance) Time Frame Status Date/  Initial   02/06/2015  <5% oswestry  6 wks Initial Eval    02/06/2015  5/5 MMT L hip abd, quads and HS to be able to negotiate stairs without difficulty  6 wks Progressing  03/12/15 GJ   02/06/2015  <3/10 pain in back to be able to sit at desk for >4 hours  6 wks Initial Eval    02/06/2015  Pt will demo proper arch support and shoe wear to allow for normal gait pattern  6 wks Initial Eval    02/06/2015  Pt will be independent with all HEP 6 wks Was issued   02/20/15  [place]        Patient requires continued skilled care to: decrease back  pain to be able to walk >5 miles.     Plan:  Continue with Plan of Care  progress core stabilization. [place] low back in 1-2 sessions.     Marylu Lund, PT, [place] [phone] 03/12/2015

## 2015-03-15 ENCOUNTER — Inpatient Hospital Stay: Payer: 59 | Admitting: Physical Therapist

## 2015-03-20 ENCOUNTER — Inpatient Hospital Stay: Payer: 59 | Admitting: Rehabilitative and Restorative Service Providers"

## 2015-03-22 ENCOUNTER — Inpatient Hospital Stay: Payer: 59 | Admitting: Physical Therapist

## 2015-03-30 ENCOUNTER — Ambulatory Visit (INDEPENDENT_AMBULATORY_CARE_PROVIDER_SITE_OTHER): Payer: 59 | Admitting: Hematology & Oncology

## 2015-03-30 ENCOUNTER — Encounter (INDEPENDENT_AMBULATORY_CARE_PROVIDER_SITE_OTHER): Payer: Self-pay

## 2015-03-30 ENCOUNTER — Encounter (INDEPENDENT_AMBULATORY_CARE_PROVIDER_SITE_OTHER): Payer: Self-pay | Admitting: Hematology & Oncology

## 2015-03-30 VITALS — BP 103/73 | HR 85 | Temp 98.3°F | Ht 62.0 in | Wt 117.4 lb

## 2015-03-30 DIAGNOSIS — C182 Malignant neoplasm of ascending colon: Secondary | ICD-10-CM

## 2015-03-30 NOTE — Progress Notes (Signed)
Subjective:       Patient ID: Teresa [name] is a 44 y.o. female.    HPI  44 year old female with a history of T3N0M0 colon cancer and a strong family history of colon cancer and basal cell cancer presents for follow up.  She was diagnosed with colon cancer in 2009 in [place]. 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 She had 0/34 positive lymph nodes. She received 12 cycles of adjuvant chemotherapy with 5-FU/Leucovorin.    Her microsatellite IHC was normal. Her father died of colon cancer at 31, and her only brother had polyps. She had an [facility] colon cancer panel that was reportedly normal other than a POLB Leu259Ser.    She is followed for iron deficiency anemia of unclear origin. She had taken prophylactic aspirin for a time, but discontinued this around 2014.    Her recurrence screening included Q2 year MRI for the first 5 years as she refused CT scans.    She also has a history of basal cell cancer x2. Her mother has a history of basal cell cancer as well. She has had benign uterine polyps.    She takes multiple supplements (scanned into EPIC) that include Curcumin, ish Oil, I-theanine, I-lysine, Probioatics, Digestive enzymes.    COLONOSCOPY:  Feb. 2016 with Dr. Theodora Blow    EGD  Feb 2016-gastric polyps-repeat suggested in 2018.    PMH:  Allergies/seasonal  ADHD  Depression  Colon cancer  Basal cell cancer        Review of Systems    General: No acute distress  HEENT: no thrush seen. No oropharyngeal masses.  No lymphadenopathy in neck appreciated.  Lungs clear to auscultation B/L.  Regular Rate and rhythm.  Abdomen soft, non-tender, non-distended.  No clubbing, cyanosis, or edema.  Neuro exam without focal weakness.      Objective:    Physical Exam     BP 103/73 mmHg  Pulse 85  Temp(Src) 98.3 F (36.8 C) (Oral)  Ht 1.575 m (5\' 2" )  Wt 53.252 kg (117 lb 6.4 oz)  BMI 21.47 kg/m2  SpO2 98%  General: No acute distress  HEENT:  no thrush seen. No oropharyngeal masses.  No lymphadenopathy in neck appreciated.  Lungs clear to auscultation B/L.  Regular Rate and rhythm.  Abdomen soft, non-tender, non-distended.  No clubbing, cyanosis, or edema.  Neuro exam without focal weakness.      Assessment:       44 year old female with a history of right sided T3N0 colon cancer in 2009, and with a strong family history. She tested negative for Lynch Syndrome. She has no evidence of disease at this time.      Plan:       Given the very strong family history and her early presentation, continue Lynch-like screening.  -CEA, CA 125 yearly. (last normal in Jan. 2016)  -Colonoscopy Q2 years (referral to [facility]).  -Pelvic U/S and GYN exam yearly (referred to Dr. Lillie Fragmin).  -Q6 follow up with me for now.  -For anemia work up, Iron profile, CBC, B12/folate done at outside office.   -I don't think there is an indication for ongoing body MRI. As she explains it to me, she was replacing her typical annual CT screening for recurrence with MRI due to personal preference. Given that her diagnosis was >5 years ago, I don't think further body imaging is indicated in the absence of symptoms.

## 2015-03-30 NOTE — Progress Notes (Signed)
Scheduled appointment for Ultrasound at [facility] [address], [place], [address]. Thursday December 8th at 9:30am. Patient is aware of appointment.

## 2015-03-31 LAB — CA 125: Cancer Antigen 125: 9.7 U/mL (ref 0.0–38.1)

## 2015-03-31 LAB — CEA: CEA: 2.6 ng/mL (ref 0.0–4.7)

## 2015-04-03 ENCOUNTER — Telehealth (INDEPENDENT_AMBULATORY_CARE_PROVIDER_SITE_OTHER): Payer: Self-pay

## 2015-04-03 ENCOUNTER — Inpatient Hospital Stay: Payer: 59 | Admitting: Physical Therapist

## 2015-04-03 NOTE — Telephone Encounter (Signed)
Called to let Pt know CA 125 and CEA are normal per Dr. Lady Gary.

## 2015-04-13 ENCOUNTER — Inpatient Hospital Stay: Payer: 59 | Admitting: Physical Therapist

## 2015-05-14 ENCOUNTER — Institutional Professional Consult (permissible substitution) (INDEPENDENT_AMBULATORY_CARE_PROVIDER_SITE_OTHER): Payer: 59 | Admitting: Dermatology

## 2015-06-18 ENCOUNTER — Encounter (INDEPENDENT_AMBULATORY_CARE_PROVIDER_SITE_OTHER): Payer: 59

## 2015-08-23 ENCOUNTER — Ambulatory Visit
Admission: RE | Admit: 2015-08-23 | Discharge: 2015-08-23 | Disposition: A | Discharge status: Home / Self Care | Payer: 59 | Source: Ambulatory Visit | Attending: Hematology & Oncology | Admitting: Hematology & Oncology

## 2015-08-23 DIAGNOSIS — D251 Intramural leiomyoma of uterus: Secondary | ICD-10-CM | POA: Insufficient documentation

## 2015-08-23 DIAGNOSIS — R938 Abnormal findings on diagnostic imaging of other specified body structures: Secondary | ICD-10-CM | POA: Insufficient documentation

## 2015-08-23 DIAGNOSIS — C182 Malignant neoplasm of ascending colon: Secondary | ICD-10-CM

## 2015-09-29 ENCOUNTER — Ambulatory Visit (INDEPENDENT_AMBULATORY_CARE_PROVIDER_SITE_OTHER): Payer: 59 | Admitting: Emergency Medicine

## 2015-09-29 ENCOUNTER — Ambulatory Visit (INDEPENDENT_AMBULATORY_CARE_PROVIDER_SITE_OTHER): Payer: 59

## 2015-09-29 ENCOUNTER — Encounter (INDEPENDENT_AMBULATORY_CARE_PROVIDER_SITE_OTHER): Payer: Self-pay

## 2015-09-29 VITALS — BP 105/72 | HR 79 | Temp 99.4°F | Resp 16 | Ht 62.0 in | Wt 120.0 lb

## 2015-09-29 DIAGNOSIS — S99922A Unspecified injury of left foot, initial encounter: Secondary | ICD-10-CM

## 2015-09-29 DIAGNOSIS — S92522A Displaced fracture of medial phalanx of left lesser toe(s), initial encounter for closed fracture: Secondary | ICD-10-CM

## 2015-09-29 NOTE — Progress Notes (Signed)
[facility]  PROGRESS NOTE     Patient: Teresa [name]   Date: 09/29/2015   MRN: [id number]       Florie Stapleford Crabtree is a 45 y.o. female      SUBJECTIVE     Chief Complaint   Patient presents with   . Toe Injury     Patient walked into stool and hit left fifth toe. Cannot move toe, or bare weight. Onset 3:15 pm         HPI  Pt accidentally kicked a chair at 3 pm today and injured left 5th toe.    Pt has hx osteopenia when on chemo, now better.    No meds taken, just iced.  Has own wheel chair she came in. Declined crutches at this time, will try to find own.    Review of Systems   Musculoskeletal: Positive for joint swelling, arthralgias and gait problem.   Skin: Negative for color change, pallor and wound.   Neurological: Negative for weakness and numbness.   All other systems reviewed and are negative.      The following portions of the patient's history were reviewed and updated as appropriate: Allergies, Current Medications, Past Family History, Past Medical history, Past social history, Past surgical history, and Problem List.    OBJECTIVE     Vitals   Filed Vitals:    09/29/15 1619   BP: 105/72   Pulse: 79   Temp: 99.4 F (37.4 C)   TempSrc: Tympanic   Resp: 16   Height: 1.575 m (5\' 2" )   Weight: 54.432 kg (120 lb)       Physical Exam   Nursing note and vitals reviewed.  Constitutional: She appears distressed.   Wheel chair   Eyes: EOM are normal. Pupils are equal, round, and reactive to light.   Musculoskeletal: She exhibits tenderness.   TN and gross deformity, left 5th finger splayed to side, decreased ROM, TN, nailbed intact, good cap refill   Neurological: She is alert.   Skin: Skin is warm. No rash noted. No erythema.       Lab Results (24 Hour)   Results     ** No results found for the last 24 hours. **          Radiology Results (24 Hour)     Procedure Component Value Units Date/Time    XR Foot Left  3 views [[id number]] Collected:  09/29/15 1655    Order Status:  Completed Updated:   09/29/15 1702    Narrative:      Reason for exam: 45 year old female with left fifth toe pain after  trauma.    FINDINGS:  AP, oblique, and lateral views.  There is an oblique, laterally angulated fracture involving the proximal  phalanx at the left fifth toe. Swelling is seen in the adjacent soft  tissues.      Impression:       Left fifth proximal phalanx fracture.    Wilmon Pali, MD   09/29/2015 4:57 PM            ASSESSMENT     Encounter Diagnoses   Name Primary?   . Toe injury, left, initial encounter    . Closed displaced fracture of middle phalanx of lesser toe of left foot, initial encounter Yes          PLAN     Ortho Injury Treatment  Date/Time: 09/29/2015 5:12 PM  Performed by: Orland Penman  Authorized by: Clerance Lav CAROL  Risks and benefits: risks, benefits and alternatives were discussed  Consent given by: patient  Injury location: toe  Location details: left fifth toe  Injury type: fracture-dislocation  Fracture type: middle phalanx  Pre-procedure neurovascular assessment: neurovascularly intact  Pre-procedure distal perfusion: normal  Pre-procedure neurological function: normal  Pre-procedure range of motion: reduced  Local anesthesia used: no  Patient sedated: no  Manipulation performed: yes  Skin traction used: no  Skeletal traction used: yes  Reduction successful: yes  Post-procedure neurovascular assessment: post-procedure neurovascularly intact  Post-procedure distal perfusion: normal  Post-procedure neurological function: normal  Post-procedure range of motion: normal  Patient tolerance: Patient tolerated the procedure well with no immediate complications  Comments: Reduced 5th toe and buddy taped, tolerated well        1. Toe injury, left, initial encounter  - XR Foot Left  3 views    2. Closed displaced fracture of middle phalanx of lesser toe of left foot, initial encounter    Left 5th toe fracture  Ice, elevate and buddy tape and hard sole shoe until seen by orthopedist    Tylenol for pain or motrin.  Follow up if worsens  An After Visit Summary was printed and given to the patient.      Signed,  Orland Penman, MD  09/29/2015

## 2015-09-29 NOTE — Addendum Note (Signed)
Addended by: Lynnell Chad on: 09/29/2015 05:17 PM     Modules accepted: Orders

## 2015-09-29 NOTE — Patient Instructions (Addendum)
Closed Toe Fracture  Your big toe is broken (fractured). This causes local pain, swelling, and bruising. This injury takes about 4 weeks to heal. Toe injuries are often treated by taping the injured toe to the next one (buddy taping). This protects the injured toe and holds it in position.    If the toenail has been severely injured, it may fall off in 1 to 2 weeks. It takes up to 12 months for a new toenail to grow back.  Home care  Follow these guidelines when caring for yourself at home:   You may be given a cast shoe to wear to keep your toe from moving. If not, you can use a sandal or any shoe that doesn't put pressure on the injured toe until the swelling and pain go away. If using a sandal, be careful not to strike your foot against anything. Another injury could make the fracture worse. If you were given crutches, don't put full weight on the injured foot until you can do so without pain.   Keep your foot elevated to reduce pain and swelling. When sleeping, put a pillow under the injured leg. When sitting, support the injured leg so it is level with your waist. This is very important during the first 2 days (48 hours).   Put an ice pack on the injured area. Do this for 20 minutes every 1 to 2 hours the first day for pain relief. You can make an ice pack by wrapping a plastic bag of ice cubes in a thin towel. Continue using the ice pack 3 to 4 times a day for the next 2 days. Then use the ice pack as needed to ease pain and swelling.   If buddy tape was used and it becomes wet or dirty, change it. You may replace it with paper, plastic, or cloth tape. Cloth tape and paper tapes must be kept dry.   You may use acetaminophen or ibuprofen to control pain, unless another pain medicine was prescribed. If you have chronic liver or kidney disease, talk with your health care provider before using these medicines. Also talk with your provider if you've had a stomach ulcer or GI bleeding.   You may return to  sports or physical education activities after 4 weeks, or when you can run without pain.  Follow-up care  Follow up with your health care provider in 1 week, or as advised. This is to make sure the bone is healing the way it should.  If X-rays were taken, a radiologist will look at them. You will be told of any new findings that may affect your care.  When to seek medical advice  Call your health care provider right away if any of these occur:   Pain or swelling gets worse   The cast cracks   The cast and padding get wet and stays wet more than 24 hours   Bad odor from the cast or wound fluid stains the cast   Tightness or pressure under the cast gets worse   Toe becomes cold, blue, numb, or tingly   You can't move the toe   Signs of infection: fever, redness, warmth, swelling, or drainage from the woundor cast   Fever of101F (38.3C)or higher, or as directed by your health care provider   2000-2015 [employer] [address], [place] [address]. All rights reserved. This information is not intended as a substitute for professional medical care. Always follow your healthcare   professional's instructions.                                      Left 5th toe fracture  Ice, elevate and buddy tape and hard sole shoe until seen by orthopedist   Tylenol for pain or motrin.  Follow up if worsens

## 2015-10-01 ENCOUNTER — Ambulatory Visit (INDEPENDENT_AMBULATORY_CARE_PROVIDER_SITE_OTHER): Payer: 59 | Admitting: Hematology & Oncology

## 2015-10-01 ENCOUNTER — Encounter (INDEPENDENT_AMBULATORY_CARE_PROVIDER_SITE_OTHER): Payer: Self-pay | Admitting: Hematology & Oncology

## 2015-10-01 VITALS — BP 123/79 | HR 88 | Temp 98.9°F | Ht 62.0 in | Wt 123.0 lb

## 2015-10-01 DIAGNOSIS — Z800 Family history of malignant neoplasm of digestive organs: Secondary | ICD-10-CM

## 2015-10-01 DIAGNOSIS — C189 Malignant neoplasm of colon, unspecified: Secondary | ICD-10-CM

## 2015-10-01 DIAGNOSIS — Z1589 Genetic susceptibility to other disease: Secondary | ICD-10-CM

## 2015-10-01 DIAGNOSIS — Z1509 Genetic susceptibility to other malignant neoplasm: Secondary | ICD-10-CM

## 2015-10-01 NOTE — Progress Notes (Signed)
Subjective:       Patient ID: Teresa [name] is a 45 y.o. female.    Interval Hx:  Feeling well. SHe has a broken toe.      HPI  45 year old female with a history of T3N0M0 colon cancer and a strong family history of colon cancer and basal cell cancer presents for follow up.  She was diagnosed with colon cancer in 2009 in [place]. 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 She had 0/34 positive lymph nodes. She received 12 cycles of adjuvant chemotherapy with 5-FU/Leucovorin.    Her microsatellite IHC was normal. Her father died of colon cancer at 28, and her only brother had polyps. She had an [facility] colon cancer panel that was reportedly normal other than a POLB Leu259Ser.    She is followed for iron deficiency anemia of unclear origin, that has resolved. She had taken prophylactic aspirin for a time, but discontinued this around 2014.    Her recurrence screening included Q2 year MRI for the first 5 years as she refused CT scans.    She also has a history of basal cell cancer x2. Her mother has a history of basal cell cancer as well. She has had benign uterine polyps.    She takes multiple supplements (scanned into EPIC) that include Curcumin, ish Oil, I-theanine, I-lysine, Probioatics, Digestive enzymes.    COLONOSCOPY:  Feb. 2016 with Dr. Theodora Blow    EGD  Feb 2016-gastric polyps-repeat suggested in 2018.    PMH:  Allergies/seasonal  ADHD  Depression  Colon cancer  Basal cell cancer        Review of Systems    General: No acute distress  HEENT: no thrush seen. No oropharyngeal masses.  No lymphadenopathy in neck appreciated.  Lungs clear to auscultation B/L.  Regular Rate and rhythm.  Abdomen soft, non-tender, non-distended.  No clubbing, cyanosis, or edema.  Neuro exam without focal weakness.      Objective:    Physical Exam     BP 123/79 mmHg  Pulse 88  Temp(Src) 98.9 F (37.2 C) (Oral)  Ht 1.575 m (5\' 2" )  Wt 55.792 kg (123  lb)  BMI 22.49 kg/m2  General: No acute distress  HEENT: no thrush seen. No oropharyngeal masses.  No lymphadenopathy in neck appreciated.  Lungs clear to auscultation B/L.  Regular Rate and rhythm.  Abdomen soft, non-tender, non-distended.  No clubbing, cyanosis, or edema.  Neuro exam without focal weakness.      Assessment:       45 year old female with a history of right sided T3N0 colon cancer in 2009, and with a strong family history. She tested negative for Lynch Syndrome. She has no evidence of disease at this time.      Plan:       Given the very strong family history and her early presentation, continue Lynch-like screening.  -CEA, CA 125 yearly. (last normal in July. 2016)  -Colonoscopy Q2 years (referral to [facility]). Her next will be in the spring of 2018.  -Pelvic U/S and GYN exam yearly (referred to Dr. Lillie Fragmin). That will be done at the done at the end of 2017.  -Q6 follow up with me for now. Next in July.  -For anemia work up, Iron profile, CBC, B12/folate done at outside office.       -No indication for ongoing body MRI at this point (7+ years from diagnosis). As she explains  it to me, she was replacing her typical annual CT screening for recurrence with MRI due to personal preference. Given that her diagnosis was >5 years ago, I don't think further body imaging is indicated in the absence of symptoms. This does not mean that there is no risk of recurrence.

## 2015-10-18 ENCOUNTER — Ambulatory Visit (INDEPENDENT_AMBULATORY_CARE_PROVIDER_SITE_OTHER): Payer: 59 | Admitting: Family Medicine

## 2015-10-18 ENCOUNTER — Encounter (INDEPENDENT_AMBULATORY_CARE_PROVIDER_SITE_OTHER): Payer: Self-pay | Admitting: Family Medicine

## 2015-10-18 VITALS — BP 111/75 | HR 96 | Temp 98.4°F | Ht 62.0 in | Wt 123.0 lb

## 2015-10-18 DIAGNOSIS — N898 Other specified noninflammatory disorders of vagina: Secondary | ICD-10-CM

## 2015-10-18 DIAGNOSIS — D509 Iron deficiency anemia, unspecified: Secondary | ICD-10-CM | POA: Insufficient documentation

## 2015-10-18 DIAGNOSIS — C189 Malignant neoplasm of colon, unspecified: Secondary | ICD-10-CM

## 2015-10-18 DIAGNOSIS — F902 Attention-deficit hyperactivity disorder, combined type: Secondary | ICD-10-CM

## 2015-10-18 DIAGNOSIS — R739 Hyperglycemia, unspecified: Secondary | ICD-10-CM

## 2015-10-18 DIAGNOSIS — D508 Other iron deficiency anemias: Secondary | ICD-10-CM

## 2015-10-18 DIAGNOSIS — E559 Vitamin D deficiency, unspecified: Secondary | ICD-10-CM

## 2015-10-18 HISTORY — DX: Attention-deficit hyperactivity disorder, combined type: F90.2

## 2015-10-18 LAB — CBC AND DIFFERENTIAL
Basophils Absolute Automated: 0.03 10*3/uL (ref 0.00–0.20)
Basophils Automated: 0 %
Eosinophils Absolute Automated: 0.09 10*3/uL (ref 0.00–0.70)
Eosinophils Automated: 1 %
Hematocrit: 41.3 % (ref 37.0–47.0)
Hgb: 13.3 g/dL (ref 12.0–16.0)
Immature Granulocytes Absolute: 0.01 10*3/uL
Immature Granulocytes: 0 %
Lymphocytes Absolute Automated: 1.45 10*3/uL (ref 0.50–4.40)
Lymphocytes Automated: 20 %
MCH: 30.6 pg (ref 28.0–32.0)
MCHC: 32.2 g/dL (ref 32.0–36.0)
MCV: 95.2 fL (ref 80.0–100.0)
MPV: 10.6 fL (ref 9.4–12.3)
Monocytes Absolute Automated: 0.48 10*3/uL (ref 0.00–1.20)
Monocytes: 6 %
Neutrophils Absolute: 5.28 10*3/uL (ref 1.80–8.10)
Neutrophils: 72 %
Nucleated RBC: 0 /100 WBC (ref 0–1)
Platelets: 340 10*3/uL (ref 140–400)
RBC: 4.34 10*6/uL (ref 4.20–5.40)
RDW: 13 % (ref 12–15)
WBC: 7.34 10*3/uL (ref 3.50–10.80)

## 2015-10-18 LAB — IRON PROFILE
Iron Saturation: 15 % (ref 15–50)
Iron: 54 ug/dL (ref 40–145)
TIBC: 351 ug/dL (ref 265–497)
UIBC: 297 ug/dL (ref 126–382)

## 2015-10-18 LAB — GFR: EGFR: >60

## 2015-10-18 LAB — CA 125: CA 125: 11.1 (ref 0.0–35.0)

## 2015-10-18 LAB — HEMOGLOBIN A1C
Average Estimated Glucose: 102.5 mg/dL
Hemoglobin A1C: 5.2 % (ref 4.6–5.9)

## 2015-10-18 LAB — COMPREHENSIVE METABOLIC PANEL
ALT: 19 U/L (ref 0–55)
AST (SGOT): 26 U/L (ref 5–34)
Albumin/Globulin Ratio: 1.5 (ref 0.9–2.2)
Albumin: 4 g/dL (ref 3.5–5.0)
Alkaline Phosphatase: 75 U/L (ref 37–106)
BUN: 10 mg/dL (ref 7.0–19.0)
Bilirubin, Total: 0.5 mg/dL (ref 0.1–1.2)
CO2: 28 mEq/L (ref 21–30)
Calcium: 10 mg/dL (ref 8.5–10.5)
Chloride: 104 mEq/L (ref 100–111)
Creatinine: 0.7 mg/dL (ref 0.4–1.5)
Globulin: 2.7 g/dL (ref 2.0–3.7)
Glucose: 98 mg/dL (ref 70–100)
Potassium: 4.1 mEq/L (ref 3.5–5.3)
Protein, Total: 6.7 g/dL (ref 6.0–8.3)
Sodium: 142 mEq/L (ref 135–146)

## 2015-10-18 LAB — VITAMIN D,25 OH,TOTAL: Vitamin D, 25 OH, Total: 37 ng/mL (ref 30–100)

## 2015-10-18 LAB — FERRITIN: Ferritin: 80.3 ng/mL (ref 4.60–204.00)

## 2015-10-18 LAB — HEMOLYSIS INDEX: Hemolysis Index: 8 (ref 0–18)

## 2015-10-18 LAB — CEA: CEA: 2.5 ng/mL (ref 0.0–5.0)

## 2015-10-18 MED ORDER — ESTROGENS, CONJUGATED 0.625 MG/GM VA CREA
TOPICAL_CREAM | Freq: Every day | VAGINAL | Status: DC | PRN
Start: 2015-10-18 — End: 2016-12-04

## 2015-10-18 MED ORDER — BUPROPION HCL 75 MG PO TABS
37.5000 mg | ORAL_TABLET | Freq: Every day | ORAL | Status: DC
Start: 2015-10-18 — End: 2016-08-01

## 2015-10-18 MED ORDER — METHYLPHENIDATE HCL 5 MG PO TABS
ORAL_TABLET | ORAL | Status: DC
Start: 2015-10-18 — End: 2015-12-24

## 2015-10-18 NOTE — Assessment & Plan Note (Signed)
A/P: New.  Check vitamin D level today.  Supplement if needed.

## 2015-10-18 NOTE — Progress Notes (Signed)
Subjective:      Patient ID: Teresa [name]  is a 45 y.o.  female.     Chief Complaint   Patient presents with   . ADHD        Here for complete physical examination with fasting blood work.  Problems noted include:  Problem   Attention Deficit Hyperactivity Disorder (Adhd), Combined Type    Diagnosed many years ago.  Has been doing well on Ritalin 5mg  QID to six times a day. Long acting hasn't worked well in the past and she has not wanted to try Adderall..   She is taking wellbutrin as well, but she has been weaning medication due to SE migraine     Iron Deficiency Anemia    Iron deficiency anemia for the last several years.  She supplements with over-the-counter iron.     Vitamin D Deficiency Disease    She has been vitamin D deficient in the past.  She has had a take supplements in the past.  Now she takes an over-the-counter supplement.     Vaginal Dryness    Perimenopausal. Using premarin cream for dryness, which works well. No periods since uterine ablation for heavy bleeding.      Hyperglycemia    Prediabetes.  Runs in her family.  She is monitoring her weight and her diet.  She seen the endocrinologist in the past.  Highest a1c 5.9, last A1c was 5.4       Review of Systems    Constitutional: Negative for fatigue.   Respiratory: Negative for cough and shortness of breath.    Cardiovascular: Negative for chest pain, palpitations and leg swelling.   Neurological: Negative for dizziness and headaches.   All other systems reviewed and are negative    Past Medical History   Diagnosis Date   . Anxiety    . Depression    . ADHD (attention deficit hyperactivity disorder)    . Post-operative nausea and vomiting    . Asthma    . Headache      migraine, sensitivity to light   . Malignant neoplasm of colon 2009     chemo   . Malignant neoplasm of skin 2007     basal   . Anemia      constant   . Psoriasis    . Eczema    . Heart burn    . Dysphagia    . Sleep apnea      does not use CPAP   . Arrhythmia      pt  states she belives anemia led to heart flutters in the past.   . Seasonal allergic rhinitis    . Osteoporosis, idiopathic      Not sure.  Have had osteopenia in past but not now.   . Gastroesophageal reflux disease      Past Surgical History   Procedure Laterality Date   . Colectomy  2009     partial   . Egd  2014   . Colonoscopy  2014   . Reduction mammaplasty  2006   . Uterine ablation   2011   . Egd, dilation N/A 11/13/2014     Procedure: EGD, DILATION;  Surgeon: Huntley Estelle, MD;  Location: [facility];  Service: Gastroenterology;  Laterality: N/A;  EGD, DILATION/COLONOSCOPY W/IVA   . Colonoscopy N/A 11/13/2014     Procedure: COLONOSCOPY;  Surgeon: Huntley Estelle, MD;  Location: [id number];  Service: Gastroenterology;  Laterality: N/A;   . Appendectomy  Family History   Problem Relation Age of Onset   . Colon cancer Father    . Cancer Father      colon   . Depression Mother    . Diabetes Mother      Pre-diabetes; not diabetes   . Early death Father      Age 6, colon cancer   . Heart disease Mother    . Hyperlipidemia Mother    . Obesity Maternal Aunt    . Vision loss Mother      cataracts and glaucoma     Social History     Social History   . Marital Status: Married     Spouse Name: N/A   . Number of Children: N/A   . Years of Education: N/A     Occupational History   . Not on file.     Social History Main Topics   . Smoking status: Never Smoker    . Smokeless tobacco: Never Used   . Alcohol Use: No   . Drug Use: No   . Sexual Activity:     Partners: Male     Birth Control/ Protection: Abstinence, Surgical     Other Topics Concern   . Not on file     Social History Narrative     Current Outpatient Prescriptions   Medication Sig Dispense Refill   . Ascorbic Acid (VITAMIN C) 250 MG tablet Take 250 mg by mouth every other day.     Marland Kitchen buPROPion (WELLBUTRIN) 75 MG tablet Take 0.5 tablets (37.5 mg total) by mouth daily. Takes 37.5 mg daily 15 tablet 5   . Calcium Citrate 200 MG Tab Take 600 mg by mouth  daily.        . Cholecalciferol (VITAMIN D) 1000 UNIT tablet Take 2,000 Units by mouth once a week.        Marland Kitchen CINNAMON PO Take by mouth.     . conjugated estrogens (PREMARIN) vaginal cream Place vaginally daily as needed. 30 g 5   . Digestive Enzymes (DIGESTIVE ENZYME PO) Take 1 tablet by mouth daily.        . diphenhydrAMINE (BENADRYL) 50 MG tablet Take 50 mg by mouth nightly as needed for Itching.     . ferrous sulfate 325 (65 FE) MG tablet Take 325 mg by mouth every other day.     Marland Kitchen L-Lysine 1000 MG Tab Take 1,300 mg by mouth daily.        Marland Kitchen L-Theanine 100 MG Cap Take 200 mg by mouth 2 (two) times daily.        . methylphenidate (RITALIN) 5 MG tablet Take QID. May take an additional 2 pills daily if needed. Do not fill before 12/11/15 135 tablet 0   . montelukast (SINGULAIR) 4 MG Packet Take 4 mg by mouth nightly.        . Multiple Vitamin (MULTIVITAMIN) tablet Take 1 tablet by mouth daily.     . Omega-3 Fatty Acids (OMEGA-3 FISH OIL) 500 MG Cap Take 1,280 mg by mouth daily.        . Probiotic Product (SOLUBLE FIBER/PROBIOTICS PO) Take 1 tablet by mouth daily.        . Turmeric Curcumin 500 MG Cap Take by mouth.     . [employer] LANCETS MICRO THIN 33G Misc Use daily 50 each 3     No current facility-administered medications for this visit.     Allergies   Allergen Reactions   . Codeine Other (See  Comments)     SOB/Supress Breathing   . Morphine Shortness Of Breath   . Dairy [Milk-Related Compounds] Diarrhea   . Lidocaine Nausea Only and Tinitus   . Penicillins Hives     Pt states allergy test done-allergic to penicillin Mold.    . Wheat Extract Diarrhea             Objective:   BP 111/75 mmHg  Pulse 96  Temp(Src) 98.4 F (36.9 C) (Oral)  Ht 1.575 m (5\' 2" )  Wt 55.792 kg (123 lb)  BMI 22.49 kg/m2  Physical Exam   Constitutional: She is oriented to person, place, and time. She appears well-developed and well-nourished.   HENT:   Head: Normocephalic and atraumatic.   Right Ear: External ear normal.   Left Ear: External  ear normal.   Nose: Nose normal.   Mouth/Throat: Oropharynx is clear and moist.   Eyes: Conjunctivae and EOM are normal.   Neck: Normal range of motion. Neck supple. No thyromegaly present.   Cardiovascular: Normal rate, regular rhythm and normal heart sounds.    No murmur heard.  Pulmonary/Chest: Effort normal and breath sounds normal.   Abdominal: Soft. Bowel sounds are normal.   Musculoskeletal: Normal range of motion. She exhibits no edema.   Lymphadenopathy:     She has no cervical adenopathy.   Neurological: She is alert and oriented to person, place, and time. Coordination normal.   CN 2-12 intact  Normal strength and ROM in all four extremities   Skin:   No visible rash   Psychiatric: She has a normal mood and affect. Her behavior is normal. Thought content normal.   Vitals reviewed.         Assessment and Plan:     Attention deficit hyperactivity disorder (ADHD), combined type  A/P: New.  Gave refill on the Ritalin.    Hyperglycemia  A/P: New.  Check A1c today.    Iron deficiency anemia  A/P: New.  Check complete blood count and iron profile today.    Vitamin D deficiency disease  A/P: New.  Check vitamin D level today.  Supplement if needed.    Vaginal dryness  A/P: new. Refill premarin        Body mass index is 22.49 kg/(m^2).   Discussed the patient's BMI with her.    The BMI is in the acceptable range.        Call with updates/questions/concerns.    Ellery Plunk MD

## 2015-10-18 NOTE — Assessment & Plan Note (Signed)
A/P: New.  Check complete blood count and iron profile today.

## 2015-10-18 NOTE — Assessment & Plan Note (Signed)
A/P: new. Refill premarin

## 2015-10-18 NOTE — Assessment & Plan Note (Signed)
A/P: New.  Gave refill on the Ritalin.

## 2015-10-18 NOTE — Assessment & Plan Note (Signed)
A/P: New.  Check A1c today.

## 2015-10-18 NOTE — Progress Notes (Signed)
Have you seen any new specialists/physicians since you were last here? yes      Limb alert protocol reviewed?  Yes

## 2015-10-19 ENCOUNTER — Encounter (INDEPENDENT_AMBULATORY_CARE_PROVIDER_SITE_OTHER): Payer: Self-pay | Admitting: Family Medicine

## 2015-10-23 ENCOUNTER — Encounter (INDEPENDENT_AMBULATORY_CARE_PROVIDER_SITE_OTHER): Payer: Self-pay | Admitting: Family Medicine

## 2015-10-23 ENCOUNTER — Telehealth (INDEPENDENT_AMBULATORY_CARE_PROVIDER_SITE_OTHER): Payer: Self-pay | Admitting: Family Medicine

## 2015-10-23 DIAGNOSIS — Z23 Encounter for immunization: Secondary | ICD-10-CM

## 2015-10-23 NOTE — Telephone Encounter (Signed)
Called [phone]. Spoke with patient. Informed her last Tdap was 07/17/2005. Patient stated she will come in next week. Will schedule her appt online. Dr.Spiegel can you please put an order for Tdap.

## 2015-10-24 NOTE — Addendum Note (Signed)
Addended by: Eyvonne Left on: 10/24/2015 08:02 AM     Modules accepted: Orders

## 2015-10-24 NOTE — Telephone Encounter (Signed)
Done

## 2015-10-28 ENCOUNTER — Encounter (INDEPENDENT_AMBULATORY_CARE_PROVIDER_SITE_OTHER): Payer: Self-pay | Admitting: Family Medicine

## 2015-10-29 ENCOUNTER — Ambulatory Visit (INDEPENDENT_AMBULATORY_CARE_PROVIDER_SITE_OTHER): Payer: 59 | Admitting: Family Medicine

## 2015-10-29 ENCOUNTER — Encounter (INDEPENDENT_AMBULATORY_CARE_PROVIDER_SITE_OTHER): Payer: Self-pay | Admitting: Family Medicine

## 2015-10-29 ENCOUNTER — Other Ambulatory Visit (INDEPENDENT_AMBULATORY_CARE_PROVIDER_SITE_OTHER): Payer: Self-pay

## 2015-10-29 VITALS — BP 119/84 | HR 87 | Temp 98.4°F | Wt 120.0 lb

## 2015-10-29 DIAGNOSIS — Z23 Encounter for immunization: Secondary | ICD-10-CM

## 2015-10-29 DIAGNOSIS — R21 Rash and other nonspecific skin eruption: Secondary | ICD-10-CM

## 2015-10-29 MED ORDER — DOXYCYCLINE HYCLATE 100 MG PO TABS
100.0000 mg | ORAL_TABLET | Freq: Two times a day (BID) | ORAL | Status: DC
Start: 2015-10-29 — End: 2015-11-03

## 2015-10-29 NOTE — Progress Notes (Signed)
Have you seen any new specialists/physicians since you were last here? no      Limb alert protocol reviewed?  Yes

## 2015-10-29 NOTE — Progress Notes (Signed)
Subjective:      Patient ID: Teresa [name]  is a 45 y.o.  female.     Chief Complaint   Patient presents with   . Immunizations     Tdap   . Rash     Right leg.         HPI    Patient comes in today with skin rash.  She was previously seen to have a scaly area of skin that I felt was fungal and suggested she try Lotrimin.  She has been applying that for 8 days.  Yesterday she noticed a bright red ring around the area.  It is warm and raised.  It is also itchy.  She is not having fevers, chills, body aches.    She has a past medical history significant for malignant neoplasm of the colon.    She does hike but does not typically hikes through tall grasses.  She does not have any known tick bites.    Past Medical History   Diagnosis Date   . Anxiety    . Depression    . ADHD (attention deficit hyperactivity disorder)    . Post-operative nausea and vomiting    . Asthma    . Headache      migraine, sensitivity to light   . Malignant neoplasm of colon 2009     chemo   . Malignant neoplasm of skin 2007     basal   . Anemia      constant   . Psoriasis    . Eczema    . Heart burn    . Dysphagia    . Sleep apnea      does not use CPAP   . Arrhythmia      pt states she belives anemia led to heart flutters in the past.   . Seasonal allergic rhinitis    . Osteoporosis, idiopathic      Not sure.  Have had osteopenia in past but not now.   . Gastroesophageal reflux disease        Review of Systems   No fevers, joint pain, muscle aches.  No nausea or vomiting      Objective:     BP 119/84 mmHg  Pulse 87  Temp(Src) 98.4 F (36.9 C) (Oral)  Wt 54.432 kg (120 lb)  SpO2 97%    Physical Exam   Skin:   There is a 8 cm area of redness with a dark red ring on the outside.  The area is warm.  There is a 2 centimeter area of scaly, raised skin.     Vital signs reviewed     Assessment and Plan:     1. Skin rash  New problem.  The rash initially felt was fungal has now changed.  Now I am concern for Lyme disease.  Plan to have  her do lab for Lyme antibodies.  We discussed that this may not be accurate in the setting of erythema migrans.  We talked about the guidelines for treating when there is a rash consistent with Lyme regardless of the testing.  She would prefer to do testing and wait for results.  We discussed sending to dermatology which she is interested in.  She also wants to know if this could still be fungus with ALLERGIC reaction superimposed.  Sent skin scraping for fungal smear.  Spent 25 minutes of patient greater than half counseling options and treatment plan.  - Lyme Ab Tot Rflx to  [facility] IGG/IGM  - Fungal Skin,Nail,and Hair Culture Smear  - Ambulatory referral to Dermatology  - doxycycline (VIBRA-TABS) 100 MG tablet; Take 1 tablet (100 mg total) by mouth 2 (two) times daily.  Dispense: 28 tablet; Refill: 0    Discussed red flags to return for reevaluation.  Risk & Benefits of any previous or new medication(s) were explained to the patient who verbalized understanding & agreed to the treatment plan.     Call with updates/questions/concerns.    Ellery Plunk, M.D.

## 2015-10-30 LAB — LYME AB, TOTAL,REFLEX TO WESTERN BLOT (IGG & IGM): Lyme AB,Total,Reflx to WB(IGM): 0.27 (ref 0.00–0.90)

## 2015-11-02 ENCOUNTER — Encounter (INDEPENDENT_AMBULATORY_CARE_PROVIDER_SITE_OTHER): Payer: Self-pay

## 2015-11-02 ENCOUNTER — Ambulatory Visit (INDEPENDENT_AMBULATORY_CARE_PROVIDER_SITE_OTHER): Payer: 59 | Admitting: Family

## 2015-11-02 ENCOUNTER — Ambulatory Visit (INDEPENDENT_AMBULATORY_CARE_PROVIDER_SITE_OTHER): Payer: 59

## 2015-11-02 VITALS — BP 133/88 | HR 91 | Temp 98.8°F | Resp 18 | Wt 120.0 lb

## 2015-11-02 DIAGNOSIS — R21 Rash and other nonspecific skin eruption: Secondary | ICD-10-CM

## 2015-11-02 DIAGNOSIS — B354 Tinea corporis: Secondary | ICD-10-CM

## 2015-11-02 DIAGNOSIS — L03115 Cellulitis of right lower limb: Secondary | ICD-10-CM

## 2015-11-02 MED ORDER — SULFAMETHOXAZOLE-TRIMETHOPRIM 800-160 MG PO TABS
1.0000 | ORAL_TABLET | Freq: Two times a day (BID) | ORAL | Status: AC
Start: 2015-11-02 — End: 2015-11-12

## 2015-11-02 MED ORDER — KETOCONAZOLE 2 % EX CREA
TOPICAL_CREAM | Freq: Every day | CUTANEOUS | Status: DC
Start: 2015-11-02 — End: 2015-11-03

## 2015-11-02 NOTE — Patient Instructions (Signed)
Wash areas with soap and water, pat dry.  Discard towel for washing.  Allow rash "open to air " while sleeping or resting at home.  Apply prescription cream to rash area 2 times per day.  Take benadryl 25 mg for itching and rash.  Antibiotics as directed.    Follow up tomorrow if rash does not improve or other symptoms.    Follow up sooner if rash worsens or new symptoms develop.      Ringworm of the Skin    Ringworm is a fungal infection of the skin. Despite the name, a worm doesn't cause it. The cause of ringworm is a fungus that infects the outer layers of the skin. It is also not caused by bed bugs, scabies, or lice. These are totally different.  The medical term for ringworm is tinea. It can affect most parts of your body, although it seems to do better in moist areas of the body and around hair. It can be on almost any part of your body, including:   Arms, hands, legs, chest, feet, and back   Scalp   Beard   Groin   Between the toes  Depending on where it is located, sometimes the name changes:   Tinea capitis (scalp)   Tinea cruris (groin)   Tinea corporis (body)   Tinea pedis (feet)  Causes  Ringworm is very common all over the world, including the [place] It can take less than 1 week up to 2 weeks before you develop the infection after being exposed. So, you may not figure out the exact cause.  It is spread through direct contact with:   An infected person or animal   Infected soil, or objects such as towels, clothing, and combs  Symptoms  At first you might not notice ringworm. Or you may just see a small, red, often raised itchy spot or pimple. Sometimes there may only be one spot. At other times there may be several. Ringworm can look slightly different on different parts of the body, but there are some things are always present:   Irregular, round, oval or ring-shaped, which is why it's called ringworm   Clearer or lighter color at the center, since it spreads from the center of the spot  outward   Red or inflamed look   Raised   Itchy   Scaly, dry, or flaky  Home care  Follow these tips to help care for yourself at home:   Leave it alone. Don't scratch at the rash or pick it. This can increase the chance of infection and scarring.   Take medicine as prescribed. If you were prescribed a cream, apply it exactly as directed. Make sure to put the cream not just on the rash, but also on the skin 1 or 2 inches around it. Medicine by mouth issometimes needed, particularly for ringworm on the scalp.Take it as directed and until your healthcare provider says to stop.   Keep it from spreading to others. Untreated ringworm of the skin iscontagiousby skin-to-skin contact. Your child may return to school 2 days after treatment has started.  Prevention  To some degree, prevention depends on what part of your body was affected. In general, the following good hygiene can help.   Clean upafter you get dirty or sweaty, or after using a locker room.   When possible, don'tshare combs and brushes.   Avoid having your skin and feet wet or damp for long periods.   Wear clean, loose-fitting underwear.  Follow-up care  Follow up with your healthcare provider as advised by our staff if the rash does not improve after 10 days of treatment or if the rash spreads to other areas of the body.  When to seek medical advice  Call your healthcare provider right awayif any of these occur:   Redness around the rash gets worse   Fluid drains from the rash   Fever of100.60F (38C) or higher, or as directed by your healthcare provider  Date Last Reviewed: 04/16/2015   2000-2016 [employer] [address], [place], [place] [address]. All rights reserved. This information is not intended as a substitute for professional medical care. Always follow your healthcare professional's instructions.              Staph Infection (MRSA)  Staph is the short name for the common bacteria called staphylococcus aureus.  Staph bacteria are often present on the skin without causing an infection. If it gets under the skin an infection occurs. This causes redness, tenderness, swelling and sometimes fluid drainage.  MRSA stands for methicillin-resistant staph aureus. Unlike a common staph infection, MRSA bacteria are resistant to the usual antibiotics and harder to treat. Also, MRSA is more toxic than common staph bacteria. It can spread quickly throughout the body and cause a life-threatening illness.  MRSA is spread to others by direct physical contact with the bacteria. MRSA can also be transmitted from items contaminated by a person who has the bacteria, such as bandages, towels, bed sheets, or sports equipment. It isgenerallynot spread through the air. But you can acquire it if you come in direct contact with the fluid from someone's cough or sneeze.Once you have a MRSA skin infection, you are at risk of having it again in the future.  If MRSA infection is suspected, the healthcare provider may take a wound culture to confirm the diagnosis. If an abscess is present, it may be drained. One or more antibiotics that work against MRSA will likely be prescribed.  Home care   Take any antibiotics prescribed exactly as directed. Do not stop taking them until they are gone or your healthcare provider tells you to stop, even if you feel better.   If antibiotic ointment was prescribed, use it as directed.   Wash your whole body (scalp to toes) daily for 5 days with prescription soap. Scrub fingernails with a brush for 1 minute twice a day with prescription soap.   Keep wounds covered with clean, dry bandages. Change dressings as they become soiled. Wash your hands well each time you change the bandage or touch the wound.   Remove any artificial nails and nail polish.  Treating household members  If you have been diagnosed with possible MRSA infection, those living with you are at higher risk of carrying the bacteria on their skin  or in their nose, even if there is no sign of infection. Bacteria must be removed from the skin of all household members at the same time so it is not passed back and forth. Advise them to remove the bacteria as follows:   Household member should wash with prescription soap as outlined above.   If anyone in the household has a skin infection, it must be treated by a healthcare provider. Washing alone will not treat a MRSA infection.   Clean counter tops and children's toys.   Do not share personal items such as toothbrush and razors. It is okay to share glasses, plates, and utensils.  Preventing spread of infection   Wash your hands frequently with plain soap and warm water. Be certain to clean under the fingernails, between the fingers, and the wrists. Dry hands with a single use towel (for example a paper towel). If soap and water are not available, you can use an alcohol-based hand sanitizer. Rub the sanitizer over the entire surface of the hands, fingers, and wrists until dry.   Avoid sharing personal items such as towels, washcloths, razors, clothing, or uniforms. Wash soiled sheets, towels or clothes in hot water with laundry detergent. Use an automatic clothes dryer set on high to kill any remaining bacteria.   If you use a gym, wipe down equipment with an alcohol-based sanitizer before and after each use. Wipe the handgrips as well.   If you participate in sports, shower with plain soap after every activity. Use a clean towel for each shower.  Follow-up care  Follow-up with your healthcare provider or as advised by our staff. If a wound culture was taken, call as directed for the results. You will be told about any changes to your treatment.  If you are diagnosed with MRSA, tell medical personnel in the future that you have been treated for this type of infection.  When to seek medical advice  Call your healthcare provider if any of the following occur:   Increasing redness, swelling or pain   Red  streaks in the skin around the wound   Weakness or dizziness   New appearance of pus or drainage from the wound   New fever over 100.4 F (38.0 C), or as directed by the healthcare provider  Date Last Reviewed: 06/09/2014   2000-2016 [employer] [address], [place], [place] [address]. All rights reserved. This information is not intended as a substitute for professional medical care. Always follow your healthcare professional's instructions.

## 2015-11-02 NOTE — Progress Notes (Signed)
[facility]  PROGRESS NOTE     Patient: Teresa [name]   Date: 11/02/2015   MRN: [id number]       Makinzy Stapleford Lagerquist is a 45 y.o. female      SUBJECTIVE     Chief Complaint   Patient presents with   . Cellulitis     C/o pain and swelling on her R thigh which started 6 weeks ago. Self-tx clotrimazole and tea tree oil.          HPI  Laken Stapleford Fuster is a 45 y.o. female in clinic with complaint of worsening rash on right anterior upper thigh x 4 weeks. Pt states seen by PCP, dx with "ringworm", advised OTC lotrimin without improvement.  States using "tea tree oil" and now pain and swelling at upper leg.  Denies fever, chills, nausea.    Review of Systems   Constitutional: Positive for fatigue. Negative for fever and chills.   Respiratory: Negative for cough.    Cardiovascular: Negative for chest pain.   Gastrointestinal: Negative for nausea, vomiting and diarrhea.   Skin: Positive for rash and wound.   Neurological: Negative for headaches.       The following portions of the patient's history were reviewed and updated as appropriate: Allergies, Current Medications, Past Family History, Past Medical history, Past social history, Past surgical history, and Problem List.    OBJECTIVE     Vitals   Filed Vitals:    11/02/15 1701   BP: 133/88   Pulse: 91   Temp: 98.8 F (37.1 C)   TempSrc: Oral   Resp: 18   Weight: 54.432 kg (120 lb)       Physical Exam   Nursing note and vitals reviewed.  Constitutional: She is oriented to person, place, and time. No distress.   HENT:   Mouth/Throat: No oropharyngeal exudate.   Cardiovascular: Normal rate and regular rhythm.    Pulmonary/Chest: Effort normal and breath sounds normal.   Musculoskeletal: Normal range of motion.   Lymphadenopathy:        Right: No inguinal adenopathy present.        Left: No inguinal adenopathy present.   Neurological: She is alert and oriented to person, place, and time.   Skin: Rash (7 4 cm x 7 cm circular area of erythema with  center 2.5 cm crusty ersosive lesion and 1.5 cm crusty errosive lesion.) noted.       Lab Results (24 Hour)   Results     ** No results found for the last 24 hours. **          Radiology Results (24 Hour)     ** No results found for the last 24 hours. **          ASSESSMENT     Encounter Diagnoses   Name Primary?   . Cellulitis of right lower extremity Yes   . Tinea corporis    . Rash and nonspecific skin eruption           PLAN     Procedures    1. Cellulitis of right lower extremity  - sulfamethoxazole-trimethoprim (BACTRIM DS,SEPTRA DS) 800-160 MG per tablet; Take 1 tablet by mouth 2 (two) times daily.  Dispense: 20 tablet; Refill: 0    2. Tinea corporis  - ketoconazole (NIZORAL) 2 % cream; Apply topically daily.  Dispense: 15 g; Refill: 0    3. Rash and nonspecific skin eruption  - ketoconazole (NIZORAL) 2 %  cream; Apply topically daily.  Dispense: 15 g; Refill: 0    Wash areas with soap and water, pat dry.  Discard towel for washing.  Allow rash "open to air " while sleeping or resting at home.  Apply prescription cream to rash area 2 times per day.  Take benadryl 25 mg for itching and rash.  Antibiotics as directed.    Follow up tomorrow if rash does not improve or other symptoms.    Follow up sooner if rash worsens or new symptoms develop.    An After Visit Summary was printed and given to the patient.      Signed,  Wayne Both, NP  11/02/2015

## 2015-11-03 ENCOUNTER — Ambulatory Visit (INDEPENDENT_AMBULATORY_CARE_PROVIDER_SITE_OTHER): Payer: 59 | Admitting: Emergency Medicine

## 2015-11-03 ENCOUNTER — Telehealth (INDEPENDENT_AMBULATORY_CARE_PROVIDER_SITE_OTHER): Payer: Self-pay | Admitting: Emergency Medicine

## 2015-11-03 ENCOUNTER — Encounter (INDEPENDENT_AMBULATORY_CARE_PROVIDER_SITE_OTHER): Payer: Self-pay

## 2015-11-03 VITALS — BP 117/86 | HR 99 | Temp 98.0°F | Resp 18 | Ht 62.0 in | Wt 120.0 lb

## 2015-11-03 DIAGNOSIS — R21 Rash and other nonspecific skin eruption: Secondary | ICD-10-CM

## 2015-11-03 DIAGNOSIS — L03115 Cellulitis of right lower limb: Secondary | ICD-10-CM

## 2015-11-03 MED ORDER — DOXYCYCLINE HYCLATE 100 MG PO TABS
100.0000 mg | ORAL_TABLET | Freq: Two times a day (BID) | ORAL | Status: AC
Start: 2015-11-03 — End: 2015-11-17

## 2015-11-03 NOTE — Patient Instructions (Signed)
Discharge Instructions for Cellulitis  You have been diagnosed with cellulitis. This is an infection in the deepest layer of the skin. In some cases, the infection also affects the muscle. Cellulitis is caused by bacteria. The bacteria canenter the body through broken skin. This can happen with a cut, scratch, animal bite, or an insect bite that has been scratched. You may have been treated in the hospital with antibiotics and fluids. You will likely be given a prescription for antibiotics to take at home. This sheet will help you take care of yourself at home.  Home care  When you are home:   Take the prescribed antibiotic medicine you are given as directed until it is gone. Take it even if you feel better. It treats the infection and stops it from returning. Not taking all the medicine can make future infections hard to treat.   Keep the infected area clean.   When possible, raise the infected area above the level of your heart. This helps keep swelling down.   Talk with your healthcare provider if you are in pain. Ask what kind of over-the-counter medicine you can take for pain.   Apply clean bandages as advised.   Take your temperature once a day for a week.   Wash your hands often to prevent spreading the infection.  In the future, wash your hands before and after you touch cuts, scratches, or bandages. This will help prevent infection.  When to call your healthcare provider  Call your healthcare provider immediately if you have any of the following:   Difficulty or pain when moving the joints above or below the infected area   Discharge or pus draining from the area   Feverof100.53F (38C) or higher, or as directed by your healthcare provider   Pain that gets worse in or around the infected   Redness that gets worse in or around the infected area,particularly if the area of redness expands to a wider area   Shaking chills   Swelling of the infected area   Vomiting   Date Last Reviewed:  04/16/2015   2000-2016 [employer] [address] [place], [place] [address]. All rights reserved. This information is not intended as a substitute for professional medical care. Always follow your healthcare professional's instructions.                                          Unclear etiology for right thigh cellulitis, possible cancer  Need close follow up with dermatology   Will add DOXYCYCLINE to BACTRIM and stop KETOCONAZOLE, skin scraping negative for fungus  MOTRIN 400 mg OTC for inflammation  Go to ER if worsens

## 2015-11-03 NOTE — Progress Notes (Signed)
[facility]  PROGRESS NOTE     Patient: Teresa [name]   Date: 11/03/2015   MRN: [id number]       Teresa [name] is a 45 y.o. female      SUBJECTIVE     Chief Complaint   Patient presents with   . Cellulitis     PT present for reevaluation of cellulitis of the right upper thigh, pt had skin scraping by pcm about 6 weeks ago for possible fungus, cellulits has been present since 02/03         HPI  Pt for recheck right thigh cellulitis.  Pt seen yesterday and thought to have infection from having skin scraping for possible ring worm.  Skin scraping results negative for fungus. Redness now slightly enlarged with Bactrim and ketoconazole.  No fever.  Review of Systems   Constitutional: Negative for fever.   Cardiovascular: Negative for leg swelling.   Musculoskeletal: Negative for myalgias, joint swelling and gait problem.   Skin: Positive for color change and rash.   All other systems reviewed and are negative.      The following portions of the patient's history were reviewed and updated as appropriate: Allergies, Current Medications, Past Family History, Past Medical history, Past social history, Past surgical history, and Problem List.    OBJECTIVE     Vitals   Filed Vitals:    11/03/15 1444   BP: 117/86   Pulse: 99   Temp: 98 F (36.7 C)   TempSrc: Oral   Resp: 18   Height: 1.575 m (5\' 2" )   Weight: 54.432 kg (120 lb)       Physical Exam   Nursing note and vitals reviewed.  Constitutional: She appears well-developed and well-nourished. No distress.   Eyes: EOM are normal. Pupils are equal, round, and reactive to light.   Pulmonary/Chest: Effort normal.   Musculoskeletal: Normal range of motion. She exhibits no edema or tenderness.        Legs:  Neurological: She is alert.   Skin: Skin is warm and dry. Rash noted. She is not diaphoretic. There is erythema.       Lab Results (24 Hour)   Results     ** No results found for the last 24 hours. **          Radiology Results (24 Hour)     ** No  results found for the last 24 hours. **          ASSESSMENT     Encounter Diagnoses   Name Primary?   . Cellulitis of leg, right Yes   . Skin rash           PLAN     Procedures    1. Cellulitis of leg, right  - doxycycline (VIBRA-TABS) 100 MG tablet; Take 1 tablet (100 mg total) by mouth 2 (two) times daily.  Dispense: 28 tablet; Refill: 0    2. Skin rash  Unclear etiology for right thigh cellulitis, possible cancer  Need close follow up with dermatology   Will add DOXYCYCLINE to BACTRIM and stop KETOCONAZOLE, skin scraping negative for fungus  MOTRIN 400 mg OTC for inflammation  Go to ER if worsens  An After Visit Summary was printed and given to the patient.      Signed,  Orland Penman, MD  11/03/2015

## 2015-11-05 ENCOUNTER — Ambulatory Visit (INDEPENDENT_AMBULATORY_CARE_PROVIDER_SITE_OTHER): Payer: 59 | Admitting: Family Medicine

## 2015-11-28 ENCOUNTER — Encounter (INDEPENDENT_AMBULATORY_CARE_PROVIDER_SITE_OTHER): Payer: Self-pay | Admitting: Family Medicine

## 2015-12-22 ENCOUNTER — Encounter (INDEPENDENT_AMBULATORY_CARE_PROVIDER_SITE_OTHER): Payer: Self-pay | Admitting: Family Medicine

## 2015-12-24 ENCOUNTER — Encounter (INDEPENDENT_AMBULATORY_CARE_PROVIDER_SITE_OTHER): Payer: Self-pay | Admitting: Family Medicine

## 2015-12-24 ENCOUNTER — Ambulatory Visit (FREE_STANDING_LABORATORY_FACILITY): Payer: 59 | Admitting: Family Medicine

## 2015-12-24 VITALS — BP 111/75 | HR 83 | Temp 98.6°F | Wt 124.0 lb

## 2015-12-24 DIAGNOSIS — Z1589 Genetic susceptibility to other disease: Secondary | ICD-10-CM

## 2015-12-24 DIAGNOSIS — Q7962 Hypermobile Ehlers-Danlos syndrome: Secondary | ICD-10-CM | POA: Insufficient documentation

## 2015-12-24 DIAGNOSIS — D508 Other iron deficiency anemias: Secondary | ICD-10-CM

## 2015-12-24 DIAGNOSIS — R739 Hyperglycemia, unspecified: Secondary | ICD-10-CM

## 2015-12-24 DIAGNOSIS — E7800 Pure hypercholesterolemia, unspecified: Secondary | ICD-10-CM

## 2015-12-24 DIAGNOSIS — Z1509 Genetic susceptibility to other malignant neoplasm: Secondary | ICD-10-CM

## 2015-12-24 DIAGNOSIS — M357 Hypermobility syndrome: Secondary | ICD-10-CM

## 2015-12-24 DIAGNOSIS — F902 Attention-deficit hyperactivity disorder, combined type: Secondary | ICD-10-CM

## 2015-12-24 HISTORY — DX: Pure hypercholesterolemia, unspecified: E78.00

## 2015-12-24 LAB — IRON PROFILE
Iron Saturation: 23 % (ref 15–50)
Iron: 70 ug/dL (ref 40–145)
TIBC: 305 ug/dL (ref 265–497)
UIBC: 235 ug/dL (ref 126–382)

## 2015-12-24 LAB — HEMOLYSIS INDEX: Hemolysis Index: 3 (ref 0–18)

## 2015-12-24 LAB — FERRITIN: Ferritin: 53.72 ng/mL (ref 4.60–204.00)

## 2015-12-24 MED ORDER — METHYLPHENIDATE HCL 5 MG PO TABS
ORAL_TABLET | ORAL | Status: DC
Start: 2015-12-24 — End: 2016-04-04

## 2015-12-24 NOTE — Progress Notes (Signed)
Subjective:      Patient ID: Teresa [name]  is a 45 y.o.  female.     Chief Complaint   Patient presents with   . Medication Refill   . Blood work        HPI    Problem   Hypercholesterolemia    Cholesterol was checked at a work screening.  Her results were: HDL 96, Total Chol 229.  She is worried because her previous total cholesterol was in the 160s.  She eats a lot of coconut.     Hypermobility Syndrome    She has joint pain in bilateral hands, wrists, elbows, shoulders, knees, ankles.  She also has pain in her neck.  She has questions about seeing a rheumatologist for evaluation for hypermobility.     Attention Deficit Hyperactivity Disorder (Adhd), Combined Type    Diagnosed many years ago.  Has been doing well on Ritalin 5mg  QID to six times a day. Long acting hasn't worked well in the past and she has not wanted to try Adderall.  She is taking wellbutrin as well, but she has been weaning medication due to SE migraine.  She is now down to Wellbutrin one half tablet every other day.     Iron Deficiency Anemia    Iron deficiency anemia for the last several years.  She supplements with over-the-counter iron.  She says that she has been feeling more tired over the last several months.  She is concerned may be because her iron level is low.     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     Hyperglycemia    Prediabetes.  Runs in her family.  She is monitoring her weight and her diet.  She seen the endocrinologist in the past.  Highest a1c 5.9, last A1c was 5.4.  Most recent blood sugar fasting was 79.          Past Medical History   Diagnosis Date   . Anxiety    . Depression    . ADHD (attention deficit hyperactivity disorder)    . Post-operative nausea and vomiting    . Asthma    . Headache      migraine, sensitivity to light   . Malignant neoplasm of colon 2009     chemo   . Malignant neoplasm of skin 2007     basal   . Anemia      constant   . Psoriasis    . Eczema    . Heart burn    .  Dysphagia    . Sleep apnea      does not use CPAP   . Arrhythmia      pt states she belives anemia led to heart flutters in the past.   . Seasonal allergic rhinitis    . Osteoporosis, idiopathic      Not sure.  Have had osteopenia in past but not now.   . Gastroesophageal reflux disease    . Hypercholesterolemia 12/24/2015       Review of Systems   No fevers, chest pain, shortness of breath.  No worsening of insomnia or weight loss.    Objective:     BP 111/75 mmHg  Pulse 83  Temp(Src) 98.6 F (37 C) (Oral)  Wt 56.246 kg (124 lb)  SpO2 98%    Physical Exam   Eyes: Conjunctivae are normal.   Neck: Normal range of motion. Neck supple.   Cardiovascular: Normal rate, regular  rhythm, normal heart sounds and intact distal pulses.    Pulmonary/Chest: Effort normal and breath sounds normal.   Skin: No rash noted.   Psychiatric: She has a normal mood and affect. Her behavior is normal.     Vital signs reviewed     Assessment and Plan:     Iron deficiency anemia  A/P: Worse.  Check iron level today.    Hyperglycemia  A/P: Improved.  Encouraged her to continue to monitor her diet.    Hypercholesterolemia  A/P: Worse.  However, we talked about how high her HDL was.  I encouraged her to continue to eat a to healthy diet including a healthy fats like avocado, nuts, fish.  Encouraged her to maintain activity.    Attention deficit hyperactivity disorder (ADHD), combined type  A/P: Stable.  Refilled Ritalin    Hypermobility syndrome  A/P: New.  We discussed that she could see a rheumatologist for evaluation.  We talked about gentle exercise like swimming or tai chi.      Discussed red flags to return for reevaluation.  Risk & Benefits of any previous or new medication(s) were explained to the patient who verbalized understanding & agreed to the treatment plan.     Call with updates/questions/concerns.    Ellery Plunk, M.D.

## 2015-12-24 NOTE — Assessment & Plan Note (Signed)
A/P: Stable.  Refilled Ritalin

## 2015-12-24 NOTE — Progress Notes (Signed)
Have you seen any new specialists/physicians since you were last here? yes      Limb alert protocol reviewed?  Yes

## 2015-12-24 NOTE — Assessment & Plan Note (Signed)
A/P: New.  We discussed that she could see a rheumatologist for evaluation.  We talked about gentle exercise like swimming or tai chi.

## 2015-12-24 NOTE — Assessment & Plan Note (Signed)
A/P: Worse.  However, we talked about how high her HDL was.  I encouraged her to continue to eat a to healthy diet including a healthy fats like avocado, nuts, fish.  Encouraged her to maintain activity.

## 2015-12-24 NOTE — Assessment & Plan Note (Signed)
A/P: Improved.  Encouraged her to continue to monitor her diet.

## 2015-12-24 NOTE — Assessment & Plan Note (Signed)
A/P: Worse.  Check iron level today.

## 2016-01-01 ENCOUNTER — Encounter (INDEPENDENT_AMBULATORY_CARE_PROVIDER_SITE_OTHER): Payer: Self-pay | Admitting: Family Medicine

## 2016-01-14 ENCOUNTER — Encounter (INDEPENDENT_AMBULATORY_CARE_PROVIDER_SITE_OTHER): Payer: Self-pay | Admitting: Family Medicine

## 2016-02-06 ENCOUNTER — Encounter (INDEPENDENT_AMBULATORY_CARE_PROVIDER_SITE_OTHER): Payer: Self-pay | Admitting: Vascular Neurology

## 2016-02-06 ENCOUNTER — Ambulatory Visit (INDEPENDENT_AMBULATORY_CARE_PROVIDER_SITE_OTHER): Payer: 59 | Admitting: Vascular Neurology

## 2016-02-06 VITALS — BP 108/64 | Ht 62.0 in | Wt 121.0 lb

## 2016-02-06 DIAGNOSIS — R202 Paresthesia of skin: Secondary | ICD-10-CM

## 2016-02-06 DIAGNOSIS — Z8669 Personal history of other diseases of the nervous system and sense organs: Secondary | ICD-10-CM

## 2016-02-06 DIAGNOSIS — R1310 Dysphagia, unspecified: Secondary | ICD-10-CM

## 2016-02-06 DIAGNOSIS — Z8709 Personal history of other diseases of the respiratory system: Secondary | ICD-10-CM

## 2016-02-06 DIAGNOSIS — M542 Cervicalgia: Secondary | ICD-10-CM

## 2016-02-06 NOTE — Progress Notes (Signed)
Subjective:      Patient ID: Teresa [name] is a 45 y.o. female.    HPI   This is a 45 year old woman who is referred for neurologic evaluation and management.  For many years.  She has had symptoms of neck pain, describing pain, numbness, and tingling that starts in her neck and moves up into her head.  The pain is debilitating at times, and when his particular bad.  She feels like she has pressure in her head, as if her head is swollen.  There are other times where she feels like it is hard to hold her head up.  She denies any pulling of the head or neck in any direction, but reports occasional crepitus if she moves her head from side to side.  Other times she feels like her neck cannot support the weight of her head.  She denies any radicular pain into the arms.  She finds that leaning over and shaking her head back and forth sometimes relieves the pain.    She has a 20+ year history of migraine headaches, that are usually described as pain on both sides of her head with associated nausea, phonophobia, and significant photophobia.  The latter is so significant that she typically has to wear a hat to help block the light.  She previously would have migraine aura, described as spots in her vision, but over time.  The headaches themselves have been relatively mild.  She has headaches on a daily basis.  Prior triggers include coffee, progesterone, and Wellbutrin, which she is trying to wean off of.    She reports a prior diagnosis of mild sleep apnea, and brought with her sleep study report from Jan 18, 2010 that showed an AHI of 10.3.  She subsequently tried C-Flex, but could not tolerate this and had to use Ambien to help sleep.  She has since been using an oral appliance over the last year or so, which helps cut down the snoring, and she feels more rested.  She rarely wakes up gasping for air.  She denies feeling particularly sleepy during the day.  She does report fatigue, however, in addition to waxing  and waning tinnitus.  She has joint and muscle pain, and recently was diagnosed with fibromyalgia.    She has had difficulty swallowing, and reports some choking and aspiration.  At times.  She says a muscle in her throat "does not work", and an MRI was previously suggested, though she never had this.  The symptoms began sometime in 2008 or 2009 around the time she was diagnosed with colon cancer and had a hemicolectomy and chemotherapy.    An x-ray of her cervical spine from December 26, 2015 showed "mild anterior and posterior spondylosis at C6-C7."    The following portions of the patient's history were reviewed and updated as appropriate: allergies, current medications, past family history, past medical history, past social history, past surgical history and problem list.      Review of Systems    Constitutional: Negative for fever.   Respiratory: Negative for shortness of breath.    Cardiovascular: Negative for chest pain.   Gastrointestinal: Negative for abdominal pain.   Neurological: Negative for dizziness, weakness, numbness.   Psychiatric/Behavioral: Negative for sleep disturbance.   All other systems reviewed and are negative.    Objective:   Neurologic Exam  General: WDWN, NAD. Vital signs reviewed.  Heart: RRR  Neck: Supple, w/o bruits noted. Full ROM.  Extremities: No edema  noted in the hands or feet.     NEUROLOGICAL EXAM    Mental Status: Awake, alert, oriented. Speech fluent. Follows commands well. Attention, memory, and concentration intact. Fund of knowledge appropriate for education.     Cranial Nerves: Pupils equally round and reactive to light bilaterally. Fundoscopic exam shows sharp discs without pallor bilaterally. Extraocular movements are full. No nystagmus seen. Facial sensation to light touch is intact. Face is symmetric. Hearing is intact to conversational speech. Tongue and palate are midline. Cranial nerves II-XII otherwise intact.     Motor: Full strength throughout. Bulk normal. No  tremor noted. Tone appears normal.    Sensation: Light touch, pinprick and proprioception are intact.     Coordination: Finger to nose and heel to shin testing intact without dysmetria.     Gait: Normal and steady. Tandem walking normal. Romberg negative.     DTRs: 2+ throughout. Toes downgoing.        Assessment:     1. Neck pain    2. Paresthesia    3. Hx of migraine headaches    4. History of sleep apnea - uses OAT, last PSG 2011   5. Dysphagia, unspecified type          Plan:     1.  MRI of the brain.  2.  MRI of the cervical spine.  3.  Check updated polysomnogram to assess efficacy of oral appliance, as well as rule out other sleep disturbances such as periodic limb movements of sleep.  4.  Consider additional evaluation pending results of above.  The patient was not interested in medications at this time.  5.  Can try over-the-counter vitamin supplementation for migraine prevention including: Magnesium, B2, coenzyme Q.  6.  Further recommendations pending above.  7.  Follow-up in about 4-6 weeks for reassessment.     The patient will return for follow-up as outlined above. If there are any problems with medications (if prescribed) or questions about any available test results, or if there are any new symptoms or changes in current symptoms, the patient is instructed to call the office.     Teresa Louth B. Stacy Gardner, MD  Board Certified, Neurology  Board Certified, Clinical Neurophysiology  Board Certified, Vascular Neurology  Board Certified, Sleep Medicine

## 2016-02-06 NOTE — Patient Instructions (Signed)
Can try for migraine prevention:    Magnesium oxide 250-400mg once or twice a day  Riboflavin (B2) 200-400mg once a day  Co Q 300 mg once a day

## 2016-02-07 ENCOUNTER — Encounter (INDEPENDENT_AMBULATORY_CARE_PROVIDER_SITE_OTHER): Payer: Self-pay

## 2016-02-18 ENCOUNTER — Ambulatory Visit: Payer: 59 | Admitting: Dermatology

## 2016-02-25 ENCOUNTER — Encounter (INDEPENDENT_AMBULATORY_CARE_PROVIDER_SITE_OTHER): Payer: Self-pay

## 2016-02-25 NOTE — Progress Notes (Signed)
Fax @ [phone] offices notes to [facility] for pt PA for MRI cervical spine

## 2016-02-26 ENCOUNTER — Ambulatory Visit: Payer: 59 | Attending: Vascular Neurology

## 2016-02-26 DIAGNOSIS — M50821 Other cervical disc disorders at C4-C5 level: Secondary | ICD-10-CM | POA: Insufficient documentation

## 2016-02-26 DIAGNOSIS — R202 Paresthesia of skin: Secondary | ICD-10-CM | POA: Insufficient documentation

## 2016-02-26 DIAGNOSIS — R937 Abnormal findings on diagnostic imaging of other parts of musculoskeletal system: Secondary | ICD-10-CM | POA: Insufficient documentation

## 2016-02-26 DIAGNOSIS — M4802 Spinal stenosis, cervical region: Secondary | ICD-10-CM | POA: Insufficient documentation

## 2016-02-26 DIAGNOSIS — G8929 Other chronic pain: Secondary | ICD-10-CM | POA: Insufficient documentation

## 2016-02-26 DIAGNOSIS — Z8669 Personal history of other diseases of the nervous system and sense organs: Secondary | ICD-10-CM | POA: Insufficient documentation

## 2016-02-26 DIAGNOSIS — R600 Localized edema: Secondary | ICD-10-CM | POA: Insufficient documentation

## 2016-02-26 DIAGNOSIS — M47812 Spondylosis without myelopathy or radiculopathy, cervical region: Secondary | ICD-10-CM | POA: Insufficient documentation

## 2016-02-26 DIAGNOSIS — G43909 Migraine, unspecified, not intractable, without status migrainosus: Secondary | ICD-10-CM | POA: Insufficient documentation

## 2016-02-28 NOTE — Progress Notes (Signed)
Quick Note:    Results reviewed - will discuss with patient at next follow up appointment.    ______

## 2016-03-02 ENCOUNTER — Ambulatory Visit: Payer: 59

## 2016-03-05 ENCOUNTER — Encounter (INDEPENDENT_AMBULATORY_CARE_PROVIDER_SITE_OTHER): Payer: Self-pay | Admitting: Vascular Neurology

## 2016-03-10 ENCOUNTER — Encounter (INDEPENDENT_AMBULATORY_CARE_PROVIDER_SITE_OTHER): Payer: Self-pay | Admitting: Vascular Neurology

## 2016-03-10 NOTE — Progress Notes (Signed)
The patient notified me via My Chart additional clinical symptoms.    I do often have non-symmetrical muscle tightness in my neck such that my head feels "pulled" toward the left (nose remaining parallel to floor, as if I am looking over my left shoulder).    Will discuss further at follow up.    Alainah Phang B. Stacy Gardner, MD  [facility] Neurology

## 2016-03-13 ENCOUNTER — Ambulatory Visit: Payer: 59

## 2016-03-14 ENCOUNTER — Encounter (INDEPENDENT_AMBULATORY_CARE_PROVIDER_SITE_OTHER): Payer: Self-pay | Admitting: Family Medicine

## 2016-03-21 ENCOUNTER — Encounter (INDEPENDENT_AMBULATORY_CARE_PROVIDER_SITE_OTHER): Payer: Self-pay | Admitting: Vascular Neurology

## 2016-03-21 ENCOUNTER — Ambulatory Visit (INDEPENDENT_AMBULATORY_CARE_PROVIDER_SITE_OTHER): Payer: 59 | Admitting: Vascular Neurology

## 2016-03-21 VITALS — BP 120/85 | HR 99 | Ht 62.0 in | Wt 123.0 lb

## 2016-03-21 DIAGNOSIS — G243 Spasmodic torticollis: Secondary | ICD-10-CM | POA: Insufficient documentation

## 2016-03-21 DIAGNOSIS — M489 Spondylopathy, unspecified: Secondary | ICD-10-CM | POA: Insufficient documentation

## 2016-03-21 DIAGNOSIS — Z8669 Personal history of other diseases of the nervous system and sense organs: Secondary | ICD-10-CM | POA: Insufficient documentation

## 2016-03-21 DIAGNOSIS — M542 Cervicalgia: Secondary | ICD-10-CM | POA: Insufficient documentation

## 2016-03-21 DIAGNOSIS — Z8709 Personal history of other diseases of the respiratory system: Secondary | ICD-10-CM

## 2016-03-21 MED ORDER — TRIHEXYPHENIDYL HCL 2 MG PO TABS
ORAL_TABLET | ORAL | Status: DC
Start: 2016-03-21 — End: 2016-04-04

## 2016-03-21 MED ORDER — TRIHEXYPHENIDYL HCL 2 MG PO TABS
ORAL_TABLET | ORAL | Status: DC
Start: 2016-03-21 — End: 2016-03-21

## 2016-03-21 NOTE — Progress Notes (Signed)
Subjective:      Patient ID: Teresa [name] is a 45 y.o. female.    HPI    The patient is here today with her husband.  Through email and at today's visit.  She noted that she does indeed have neck pulling, more to the left.  She says her head turns to the left and she fights to keep it straight.  The pain often goes from her neck to her head, described as tightness.  It does not really extend down her arms.  Overall, symptoms are long-standing.  She says that the pain gets particularly bad, she feels like she cannot hold her head up.    She has a long-standing history of migraines that are a bit better since starting magnesium and coenzyme Q.  Her husband notes that she has been complaining less of migraines.    She uses oral appliance, but has not yet had a follow-up sleep study, but is scheduled for later in the month.    Results for orders placed or performed in visit on 02/06/16   MRI Brain WO Contrast    Narrative                         [facility]                                 MRI [place], [place] PERFORMED AT:  [id number]       DOB:05-02-1971                 [address]  02/26/2016     AGE:30   Gender:F              Physicians Only: [phone]             Philamena Kramar B Kavan Devan MD                                    [F,W]       [address]       [place], [place] [address]        MRI BRAIN WITHOUT CONTRAST    CLINICAL HISTORY: Migraine headaches, worsening.    Comments: Unenhanced MRI of the brain was performed.    The cerebral ventricles, cortical sulci, basilar cisterns are normal. The  brain parenchyma appears normal. There is no evidence of infarct, mass, or  hemorrhage. The visualized paranasal sinuses and mastoid air cells are  clear.      Impression     Normal unenhanced MRI appearance the brain.    Electronically signed by: Melody Haver M.D.  [facility], [place]    DM: 02/27/16     MRI  cervical spine:  IMPRESSION:     1. Mild and moderate multilevel disc disease. Central canal narrowing is  minimal. Mild foraminal narrowing, most evident on the left at C3-C4 and  C4-C5.  2. Moderate left facet arthropathy at C4-C5. There is reactive fluid in the  facet joints and reactive edema in the adjacent marrow suggesting a  component of inflammatory change.  Review of Systems  Constitutional: Negative for fever.   Respiratory: Negative for shortness of breath.    Cardiovascular: Negative for chest pain.   Gastrointestinal: Negative for abdominal pain.   Neurological: Negative for dizziness, weakness, numbness.   Psychiatric/Behavioral: Negative for sleep disturbance.       Objective:   Neurologic Exam  Mental Status: The patient was awake, alert, appeared oriented and was appropriate. Speech was fluent and the patient followed commands well. Attention, concentration, and memory appeared intact. Fund of knowledge appeared appropriate for education level.   Cranial Nerves: Pupils were equally round and reactive to light bilaterally. Extraocular movements were intact without nystagmus. Hearing was intact to conversational speech. Face was symmetric. Tongue appeared midline. Head turned to left.  Motor: Good/full strength throughout without clear focality or weakness. Bulk appeared normal for body habitus. No obvious tremor noted.   Sensation: light touch was grossly intact.   Coordination: Finger to nose testing was intact without dysmetria.   Gait: Normal and steady.      Assessment:     1. Cervicalgia    2. Cervical spine disease    3. Cervical dystonia    4. Hx of migraines    5. Hx of sleep apnea          Plan:     1.  Referral to pain management, given MRI C-spine findings, indication may be a candidate for interventional treatments.  2.  Trial of Artane 1-2 mg 3 times a day for dystonia.  3.  We discussed using Botox for her cervical dystonia.  She wishes to research as a bit more before  pursuing, but will let me know if she wants [place] to start the authorization process.  I indicated this would likely be the treatment of choice for her cervical dystonia.  4.  Monitor migraines.    5.  Follow through sleep study.  6.  Continue magnesium and coenzyme Q, add B2.  7.  Uses oral appliance.  8.  Follow-up with me in 2 months.    The patient will return for follow-up as outlined above. If there are any problems with medications (if prescribed) or questions about any available test results, or if there are any new symptoms or changes in current symptoms, the patient is instructed to call the office.     Elesha Thedford B. Stacy Gardner, MD  Board Certified, Neurology  Board Certified, Clinical Neurophysiology  Board Certified, Vascular Neurology  Board Certified, Sleep Medicine

## 2016-03-22 ENCOUNTER — Encounter (INDEPENDENT_AMBULATORY_CARE_PROVIDER_SITE_OTHER): Payer: Self-pay | Admitting: Hematology & Oncology

## 2016-03-22 ENCOUNTER — Encounter (INDEPENDENT_AMBULATORY_CARE_PROVIDER_SITE_OTHER): Payer: Self-pay | Admitting: Vascular Neurology

## 2016-04-02 ENCOUNTER — Ambulatory Visit (INDEPENDENT_AMBULATORY_CARE_PROVIDER_SITE_OTHER): Payer: 59 | Admitting: Vascular Neurology

## 2016-04-04 ENCOUNTER — Ambulatory Visit (FREE_STANDING_LABORATORY_FACILITY): Payer: 59 | Admitting: Family Medicine

## 2016-04-04 ENCOUNTER — Encounter (INDEPENDENT_AMBULATORY_CARE_PROVIDER_SITE_OTHER): Payer: Self-pay | Admitting: Family Medicine

## 2016-04-04 VITALS — BP 110/76 | HR 76 | Temp 97.6°F | Ht 61.75 in | Wt 122.0 lb

## 2016-04-04 DIAGNOSIS — D508 Other iron deficiency anemias: Secondary | ICD-10-CM

## 2016-04-04 DIAGNOSIS — Z8669 Personal history of other diseases of the nervous system and sense organs: Secondary | ICD-10-CM

## 2016-04-04 DIAGNOSIS — R739 Hyperglycemia, unspecified: Secondary | ICD-10-CM

## 2016-04-04 DIAGNOSIS — E559 Vitamin D deficiency, unspecified: Secondary | ICD-10-CM

## 2016-04-04 DIAGNOSIS — F5101 Primary insomnia: Secondary | ICD-10-CM

## 2016-04-04 DIAGNOSIS — Z0000 Encounter for general adult medical examination without abnormal findings: Secondary | ICD-10-CM

## 2016-04-04 DIAGNOSIS — R232 Flushing: Secondary | ICD-10-CM

## 2016-04-04 DIAGNOSIS — Z0184 Encounter for antibody response examination: Secondary | ICD-10-CM

## 2016-04-04 DIAGNOSIS — E7800 Pure hypercholesterolemia, unspecified: Secondary | ICD-10-CM

## 2016-04-04 DIAGNOSIS — M797 Fibromyalgia: Secondary | ICD-10-CM

## 2016-04-04 DIAGNOSIS — M357 Hypermobility syndrome: Secondary | ICD-10-CM

## 2016-04-04 DIAGNOSIS — F902 Attention-deficit hyperactivity disorder, combined type: Secondary | ICD-10-CM

## 2016-04-04 LAB — FOLLICLE STIMULATING HORMONE: Follicle Stimulating Hormone: 58.58

## 2016-04-04 LAB — LIPID PANEL
Cholesterol / HDL Ratio: 2.8
Cholesterol: 223 mg/dL — ABNORMAL HIGH (ref 0–199)
HDL: 81 mg/dL (ref 40–9999)
LDL Calculated: 131 mg/dL — ABNORMAL HIGH (ref 0–99)
Triglycerides: 56 mg/dL (ref 34–149)
VLDL Calculated: 11 mg/dL (ref 10–40)

## 2016-04-04 LAB — FERRITIN: Ferritin: 79.35 ng/mL (ref 4.60–204.00)

## 2016-04-04 LAB — CBC AND DIFFERENTIAL
Basophils Absolute Automated: 0.03 10*3/uL (ref 0.00–0.20)
Basophils Automated: 0.5 %
Eosinophils Absolute Automated: 0.07 10*3/uL (ref 0.00–0.70)
Eosinophils Automated: 1.3 %
Hematocrit: 40.8 % (ref 37.0–47.0)
Hgb: 12.6 g/dL (ref 12.0–16.0)
Immature Granulocytes Absolute: 0 10*3/uL
Immature Granulocytes: 0 %
Lymphocytes Absolute Automated: 1.65 10*3/uL (ref 0.50–4.40)
Lymphocytes Automated: 30.2 %
MCH: 29.7 pg (ref 28.0–32.0)
MCHC: 30.9 g/dL — ABNORMAL LOW (ref 32.0–36.0)
MCV: 96.2 fL (ref 80.0–100.0)
MPV: 10.1 fL (ref 9.4–12.3)
Monocytes Absolute Automated: 0.5 10*3/uL (ref 0.00–1.20)
Monocytes: 9.2 %
Neutrophils Absolute: 3.21 10*3/uL (ref 1.80–8.10)
Neutrophils: 58.8 %
Nucleated RBC: 0 /100 WBC (ref 0.0–1.0)
Platelets: 301 10*3/uL (ref 140–400)
RBC: 4.24 10*6/uL (ref 4.20–5.40)
RDW: 14 % (ref 12–15)
WBC: 5.46 10*3/uL (ref 3.50–10.80)

## 2016-04-04 LAB — IRON PROFILE
Iron Saturation: 32 % (ref 15–50)
Iron: 110 ug/dL (ref 40–145)
TIBC: 343 ug/dL (ref 265–497)
UIBC: 233 ug/dL (ref 126–382)

## 2016-04-04 LAB — COMPREHENSIVE METABOLIC PANEL
ALT: 19 U/L (ref 0–55)
AST (SGOT): 24 U/L (ref 5–34)
Albumin/Globulin Ratio: 1.2 (ref 0.9–2.2)
Albumin: 4.1 g/dL (ref 3.5–5.0)
Alkaline Phosphatase: 82 U/L (ref 37–106)
BUN: 11 mg/dL (ref 7.0–19.0)
Bilirubin, Total: 0.8 mg/dL (ref 0.1–1.2)
CO2: 28 mEq/L (ref 21–30)
Calcium: 9.8 mg/dL (ref 8.5–10.5)
Chloride: 105 mEq/L (ref 100–111)
Creatinine: 0.7 mg/dL (ref 0.4–1.5)
Globulin: 3.5 g/dL (ref 2.0–3.7)
Glucose: 91 mg/dL (ref 70–100)
Potassium: 4.2 mEq/L (ref 3.5–5.1)
Protein, Total: 7.6 g/dL (ref 6.0–8.3)
Sodium: 143 mEq/L (ref 135–146)

## 2016-04-04 LAB — HEMOGLOBIN A1C
Average Estimated Glucose: 105.4 mg/dL
Hemoglobin A1C: 5.3 % (ref 4.6–5.9)

## 2016-04-04 LAB — CORTISOL: Cortisol: 11.1 ug/dL

## 2016-04-04 LAB — VITAMIN B12: Vitamin B-12: 346 pg/mL (ref 211–911)

## 2016-04-04 LAB — FOLATE: Folate: 14.4 ng/mL

## 2016-04-04 LAB — GFR: EGFR: >60

## 2016-04-04 LAB — HEMOLYSIS INDEX: Hemolysis Index: 4 (ref 0–18)

## 2016-04-04 LAB — MAGNESIUM: Magnesium: 2.3 mg/dL (ref 1.6–2.6)

## 2016-04-04 MED ORDER — METHYLPHENIDATE HCL 5 MG PO TABS
ORAL_TABLET | ORAL | Status: DC
Start: 2016-04-04 — End: 2016-05-29

## 2016-04-04 MED ORDER — AMITRIPTYLINE HCL 10 MG PO TABS
10.0000 mg | ORAL_TABLET | Freq: Every evening | ORAL | Status: DC
Start: 2016-04-04 — End: 2017-03-07

## 2016-04-04 MED ORDER — METHYLPHENIDATE HCL 5 MG PO TABS
ORAL_TABLET | ORAL | Status: DC
Start: 2016-04-04 — End: 2016-08-01

## 2016-04-04 NOTE — Progress Notes (Signed)
Subjective:      Patient ID: Teresa [name]  is a 45 y.o.  female.     Chief Complaint   Patient presents with   . Annual Exam     Patient is fasting.         Here for complete physical examination with fasting blood work.  Problems noted include:  Hypermobility- she is concerned that she may have EDS.  She has some labs she would like checked that she has researched may be abnormal in EDS. She has tried to get an appt at [facility] but they have a 2 year waiting list.    Review of Systems    Constitutional: Negative for fatigue.   Respiratory: Negative for cough and shortness of breath.    Cardiovascular: Negative for chest pain, palpitations and leg swelling.   Neurological: Negative for dizziness and headaches.   All other systems reviewed and are negative    Past Medical History   Diagnosis Date   . Anxiety    . Depression    . ADHD (attention deficit hyperactivity disorder)    . Post-operative nausea and vomiting    . Asthma    . Headache      migraine, sensitivity to light   . Malignant neoplasm of colon 2009     chemo   . Malignant neoplasm of skin 2007     basal   . Anemia      constant   . Psoriasis    . Eczema    . Heart burn    . Dysphagia    . Sleep apnea      does not use CPAP   . Arrhythmia      pt states she belives anemia led to heart flutters in the past.   . Seasonal allergic rhinitis    . Osteoporosis, idiopathic      Not sure.  Have had osteopenia in past but not now.   . Gastroesophageal reflux disease    . Hypercholesterolemia 12/24/2015     Past Surgical History   Procedure Laterality Date   . Colectomy  2009     partial   . Egd  2014   . Colonoscopy  2014   . Reduction mammaplasty  2006   . Uterine ablation   2011   . Egd, dilation N/A 11/13/2014     Procedure: EGD, DILATION;  Surgeon: Huntley Estelle, MD;  Location: [facility];  Service: Gastroenterology;  Laterality: N/A;  EGD, DILATION/COLONOSCOPY W/IVA   . Colonoscopy N/A 11/13/2014     Procedure: COLONOSCOPY;  Surgeon: Huntley Estelle, MD;  Location: [facility];  Service: Gastroenterology;  Laterality: N/A;   . Appendectomy       Family History   Problem Relation Age of Onset   . Colon cancer Father    . Cancer Father      colon   . Depression Mother    . Diabetes Mother      Pre-diabetes; not diabetes   . Early death Father      Age 56, colon cancer   . Heart disease Mother    . Hyperlipidemia Mother    . Obesity Maternal Aunt    . Vision loss Mother      cataracts and glaucoma     Social History     Social History   . Marital Status: Married     Spouse Name: N/A   . Number of Children: N/A   . Years of Education:  N/A     Occupational History   . Not on file.     Social History Main Topics   . Smoking status: Never Smoker    . Smokeless tobacco: Never Used   . Alcohol Use: No   . Drug Use: No   . Sexual Activity:     Partners: Male     Birth Control/ Protection: Abstinence, Surgical     Other Topics Concern   . Not on file     Social History Narrative     Current Outpatient Prescriptions   Medication Sig Dispense Refill   . Ascorbic Acid (VITAMIN C) 250 MG tablet Take 250 mg by mouth every other day.     Marland Kitchen buPROPion (WELLBUTRIN) 75 MG tablet Take 0.5 tablets (37.5 mg total) by mouth daily. Takes 37.5 mg daily 15 tablet 5   . Calcium Citrate 200 MG Tab Take 600 mg by mouth daily.        Marland Kitchen CINNAMON PO Take by mouth.     . Coenzyme Q-10 100 MG capsule      . conjugated estrogens (PREMARIN) vaginal cream Place vaginally daily as needed. 30 g 5   . Digestive Enzymes (DIGESTIVE ENZYME PO) Take 1 tablet by mouth daily.        . diphenhydrAMINE (BENADRYL) 50 MG tablet Take 50 mg by mouth nightly as needed for Itching.     . ferrous sulfate 325 (65 FE) MG tablet Take 325 mg by mouth every other day.     Marland Kitchen L-Lysine 1000 MG Tab Take 1,300 mg by mouth daily.        Marland Kitchen L-Theanine 100 MG Cap Take 200 mg by mouth 2 (two) times daily.        Marland Kitchen MAGNESIUM CHLORIDE PO Take 150 mg by mouth nightly.     . methylphenidate (RITALIN) 5 MG tablet Take QID. May  take an additional 2 pills daily if needed. 135 tablet 0   . montelukast (SINGULAIR) 4 MG Packet Take 8 mg by mouth nightly.        . Multiple Vitamin (MULTIVITAMIN) tablet Take 1 tablet by mouth daily.     . Omega-3 Fatty Acids (OMEGA-3 FISH OIL) 500 MG Cap Take 1,280 mg by mouth daily.        . Probiotic Product (SOLUBLE FIBER/PROBIOTICS PO) Take 1 tablet by mouth daily.        . Turmeric Curcumin 500 MG Cap Take by mouth.     Marland Kitchen amitriptyline (ELAVIL) 10 MG tablet Take 1 tablet (10 mg total) by mouth nightly. 30 tablet 11   . methylphenidate (RITALIN) 5 MG tablet Take QID. May take an additional 2 pills daily if needed. Fill 60 days from date on rx 135 tablet 0   . methylphenidate (RITALIN) 5 MG tablet Take QID. May take an additional 2 pills daily if needed. Fill 30 days from date on rx 135 tablet 0   . PREVIDENT 5000 BOOSTER PLUS 1.1 % Paste APPLY TO TEETH AFTER REGULAR BRUSHING AND FLOSSING  0     No current facility-administered medications for this visit.     Allergies   Allergen Reactions   . Codeine Other (See Comments)     SOB/Supress Breathing   . Morphine Shortness Of Breath   . Advair Diskus [Fluticasone-Salmeterol]    . Epinephrine    . Dairy [Milk-Related Compounds] Diarrhea   . Lidocaine Nausea Only and Tinitus   . Penicillins Hives     Pt  states allergy test done-allergic to penicillin Mold.    . Wheat Extract Diarrhea             Objective:   BP 110/76 mmHg  Pulse 76  Temp(Src) 97.6 F (36.4 C) (Oral)  Ht 1.568 m (5' 1.75")  Wt 55.339 kg (122 lb)  BMI 22.51 kg/m2  SpO2 98%  Physical Exam   Constitutional: She is oriented to person, place, and time. She appears well-developed and well-nourished.   HENT:   Head: Normocephalic and atraumatic.   Right Ear: External ear normal.   Left Ear: External ear normal.   Nose: Nose normal.   Mouth/Throat: Oropharynx is clear and moist.   Eyes: Conjunctivae and EOM are normal.   Neck: Normal range of motion. Neck supple. No thyromegaly present.    Cardiovascular: Normal rate, regular rhythm and normal heart sounds.    No murmur heard.  Pulmonary/Chest: Effort normal and breath sounds normal.   Abdominal: Soft. Bowel sounds are normal.   Musculoskeletal: Normal range of motion. She exhibits no edema.   Lymphadenopathy:     She has no cervical adenopathy.   Neurological: She is alert and oriented to person, place, and time. Coordination normal.   CN 2-12 intact  Normal strength and ROM in all four extremities   Skin:   No visible rash   Psychiatric: She has a normal mood and affect. Her behavior is normal. Thought content normal.   Vitals reviewed.         Assessment and Plan:   1. Routine general medical examination at a health care facility  - CBC and differential  - Comprehensive metabolic panel  - Lipid panel  - Ferritin  - Folate  - Mammo Digital Screening Bilateral W Cad; Future    2. Fibromyalgia  - amitriptyline (ELAVIL) 10 MG tablet; Take 1 tablet (10 mg total) by mouth nightly.  Dispense: 30 tablet; Refill: 11    3. Immunity status testing  - Rubeola antibody IgG    4. Hypermobility syndrome  - Vitamin B12  - Cortisol  - Osmolality, urine  - Osmolality  - Magnesium  - DHEA  - Testosterone, free, total  - amitriptyline (ELAVIL) 10 MG tablet; Take 1 tablet (10 mg total) by mouth nightly.  Dispense: 30 tablet; Refill: 11  - Echocardiogram Adult Complete W Clr/ Dopp Waveform; Future    5. Hypercholesterolemia  - Comprehensive metabolic panel  - Lipid panel    6. Vitamin D deficiency disease    7. Iron deficiency anemia secondary to inadequate dietary iron intake  - CBC and differential  - Ferritin  - Folate  - IRON PROFILE    8. Hyperglycemia  - Comprehensive metabolic panel  - Hemoglobin A1C    9. Hot flashes  - Follicle stimulating hormone  - Cortisol  - DHEA  - Testosterone, free, total    10. Attention deficit hyperactivity disorder (ADHD), combined type  - methylphenidate (RITALIN) 5 MG tablet; Take QID. May take an additional 2 pills daily if  needed. Fill 60 days from date on rx  Dispense: 135 tablet; Refill: 0  - methylphenidate (RITALIN) 5 MG tablet; Take QID. May take an additional 2 pills daily if needed.  Dispense: 135 tablet; Refill: 0  - methylphenidate (RITALIN) 5 MG tablet; Take QID. May take an additional 2 pills daily if needed. Fill 30 days from date on rx  Dispense: 135 tablet; Refill: 0    11. Primary insomnia  - amitriptyline (ELAVIL) 10  MG tablet; Take 1 tablet (10 mg total) by mouth nightly.  Dispense: 30 tablet; Refill: 11    12. Hx of migraine headaches  - amitriptyline (ELAVIL) 10 MG tablet; Take 1 tablet (10 mg total) by mouth nightly.  Dispense: 30 tablet; Refill: 11      DDS up to date; Ophtho up to date.  Colonoscopy-due in 2018  Pap-6/14, due in 5 years  Mammo-ordered today  Cholesterol-today  TDAP-2017  Flu-fall      Body mass index is 22.51 kg/(m^2).   Discussed the patient's BMI with her.    The BMI is in the acceptable range.    Nutrition Counseling: Low cholesterol diet education  Physical Activity Counseling: Patient advised about exercise     Counseling/Anticipatory Guidance (40-64): Nutrition, physical activity, healthy weight, injury prevention, misuse of tobacco, alcohol and drugs, sexual behavior and STDs, contraception, dental health, mental health, immunizations, screenings.      Call with updates/questions/concerns.    Ellery Plunk MD

## 2016-04-04 NOTE — Progress Notes (Signed)
Have you seen any specialists/other providers since your last visit with [place]?    Yes , rheumatologist, neurologist, pain doctor.         Limb alert protocol reviewed?      Yes

## 2016-04-05 LAB — OSMOLALITY, URINE: Urine Osmolality: 130 mosm/kg — ABNORMAL LOW (ref 300–1094)

## 2016-04-05 LAB — OSMOLALITY: Osmolality: 298 mosm/kg (ref 280–300)

## 2016-04-06 ENCOUNTER — Encounter (INDEPENDENT_AMBULATORY_CARE_PROVIDER_SITE_OTHER): Payer: Self-pay | Admitting: Vascular Neurology

## 2016-04-07 ENCOUNTER — Other Ambulatory Visit (INDEPENDENT_AMBULATORY_CARE_PROVIDER_SITE_OTHER): Payer: Self-pay

## 2016-04-07 ENCOUNTER — Encounter (INDEPENDENT_AMBULATORY_CARE_PROVIDER_SITE_OTHER): Payer: Self-pay | Admitting: Vascular Neurology

## 2016-04-07 DIAGNOSIS — G479 Sleep disorder, unspecified: Secondary | ICD-10-CM

## 2016-04-07 DIAGNOSIS — Z8669 Personal history of other diseases of the nervous system and sense organs: Secondary | ICD-10-CM

## 2016-04-08 LAB — TESTOSTERONE, FREE, TOTAL
Testosterone Free: 0.7 pg/mL (ref 0.1–6.4)
Testosterone Total MS: 12 ng/dL (ref 2–45)

## 2016-04-08 LAB — DEHYDROEPIANDROSTERONE,UNCONJ,LC/MS/MS

## 2016-04-09 LAB — RUBEOLA ANTIBODY IGG: Rubeola (Measles), IgG: 1.2

## 2016-04-11 ENCOUNTER — Ambulatory Visit: Payer: 59

## 2016-04-11 ENCOUNTER — Telehealth (INDEPENDENT_AMBULATORY_CARE_PROVIDER_SITE_OTHER): Payer: Self-pay | Admitting: Family Medicine

## 2016-04-11 NOTE — Telephone Encounter (Signed)
Patient would like to fax the order for Echocardiogram to [facility] at F# [phone].

## 2016-04-11 NOTE — Telephone Encounter (Signed)
The orders electronically in the system.  She should call and make an appointment.  I gave her the number to call at her visit I believe.

## 2016-04-11 NOTE — Telephone Encounter (Signed)
Patient called that echocardiogram was going to fax somewhere. She wants to know where ? Patient # is [phone]

## 2016-04-11 NOTE — Telephone Encounter (Signed)
Faxed. °Confirmation received °

## 2016-04-16 ENCOUNTER — Encounter (INDEPENDENT_AMBULATORY_CARE_PROVIDER_SITE_OTHER): Payer: Self-pay

## 2016-04-16 ENCOUNTER — Encounter (INDEPENDENT_AMBULATORY_CARE_PROVIDER_SITE_OTHER): Payer: Self-pay | Admitting: Vascular Neurology

## 2016-04-22 ENCOUNTER — Other Ambulatory Visit (INDEPENDENT_AMBULATORY_CARE_PROVIDER_SITE_OTHER): Payer: Self-pay | Admitting: Family Medicine

## 2016-04-22 DIAGNOSIS — M357 Hypermobility syndrome: Secondary | ICD-10-CM

## 2016-04-29 ENCOUNTER — Encounter (INDEPENDENT_AMBULATORY_CARE_PROVIDER_SITE_OTHER): Payer: Self-pay | Admitting: Family Medicine

## 2016-05-02 ENCOUNTER — Ambulatory Visit (INDEPENDENT_AMBULATORY_CARE_PROVIDER_SITE_OTHER): Payer: Self-pay

## 2016-05-02 ENCOUNTER — Ambulatory Visit
Admission: RE | Admit: 2016-05-02 | Discharge: 2016-05-02 | Disposition: A | Discharge status: Home / Self Care | Payer: 59 | Source: Ambulatory Visit | Attending: Family Medicine | Admitting: Family Medicine

## 2016-05-02 DIAGNOSIS — M357 Hypermobility syndrome: Secondary | ICD-10-CM | POA: Insufficient documentation

## 2016-05-03 ENCOUNTER — Encounter (INDEPENDENT_AMBULATORY_CARE_PROVIDER_SITE_OTHER): Payer: Self-pay | Admitting: Family Medicine

## 2016-05-08 ENCOUNTER — Encounter (INDEPENDENT_AMBULATORY_CARE_PROVIDER_SITE_OTHER): Payer: Self-pay | Admitting: Vascular Neurology

## 2016-05-11 ENCOUNTER — Encounter (INDEPENDENT_AMBULATORY_CARE_PROVIDER_SITE_OTHER): Payer: Self-pay | Admitting: Vascular Neurology

## 2016-05-20 ENCOUNTER — Ambulatory Visit (INDEPENDENT_AMBULATORY_CARE_PROVIDER_SITE_OTHER): Payer: 59 | Admitting: Vascular Neurology

## 2016-05-20 ENCOUNTER — Encounter (INDEPENDENT_AMBULATORY_CARE_PROVIDER_SITE_OTHER): Payer: Self-pay

## 2016-05-20 VITALS — BP 110/77 | HR 96 | Ht 67.0 in

## 2016-05-20 DIAGNOSIS — G248 Other dystonia: Secondary | ICD-10-CM

## 2016-05-20 DIAGNOSIS — M488X2 Other specified spondylopathies, cervical region: Secondary | ICD-10-CM

## 2016-05-20 DIAGNOSIS — M489 Spondylopathy, unspecified: Secondary | ICD-10-CM

## 2016-05-20 DIAGNOSIS — Z8669 Personal history of other diseases of the nervous system and sense organs: Secondary | ICD-10-CM

## 2016-05-20 DIAGNOSIS — G243 Spasmodic torticollis: Secondary | ICD-10-CM

## 2016-05-20 DIAGNOSIS — M542 Cervicalgia: Secondary | ICD-10-CM

## 2016-05-20 MED ORDER — COENZYME Q10 30 MG PO CAPS
120.0000 mg | ORAL_CAPSULE | Freq: Every day | ORAL | Status: DC
Start: 2016-05-20 — End: 2016-05-29

## 2016-05-20 MED ORDER — ONABOTULINUMTOXINA 100 UNITS IJ SOLR
100.0000 [IU] | Freq: Once | INTRAMUSCULAR | Status: AC
Start: 2016-05-20 — End: 2016-05-20

## 2016-05-20 MED ORDER — RIBOFLAVIN 100 MG PO TABS
200.0000 mg | ORAL_TABLET | Freq: Every day | ORAL | Status: DC
Start: 2016-05-20 — End: 2016-11-19

## 2016-05-20 NOTE — Progress Notes (Signed)
Subjective:       Patient ID: Teresa [name] is a 45 y.o. female.    HPI    Overall, the patient is a bit better.  She had steroid shots in her neck which helped with the pain, but afterwards she had pain and numbness in her 3rd through 5th fingers on the right hand for some time, but this resolved.  She does not have a follow-up pain clinic appointment scheduled.  She still has dystonia, and at today's visit acknowledged that she thinks Botox would be helpful.  She describes a feeling of retrocollis, which contributes to most of her discomfort.  She notes that her head pulls to the left.  Oftentimes the headache starts in the back and extends over her head She does not report any other new neurologic symptoms.    She has been taking magnesium chloride 133 mg at night.    Review of Systems  Constitutional: Negative for fever.   Respiratory: Negative for shortness of breath.    Cardiovascular: Negative for chest pain.   Gastrointestinal: Negative for abdominal pain.   Neurological: Negative for dizziness, weakness, numbness.   Psychiatric/Behavioral: Negative for sleep disturbance.           Objective:    Physical Exam  Mental Status: The patient was awake, alert, appeared oriented and was appropriate. Speech was fluent and the patient followed commands well. Attention, concentration, and memory appeared intact. Fund of knowledge appeared appropriate for education level.   Cranial Nerves: Pupils were equally round and reactive to light bilaterally. Extraocular movements were intact without nystagmus. Hearing was intact to conversational speech. Face was symmetric. Tongue appeared midline.  Head appears to tilt to the left, turned to the left.  Motor: Good/full strength throughout without clear focality or weakness. Bulk appeared normal for body habitus. No obvious tremor noted.   Sensation: light touch was grossly intact.   Coordination: Finger to nose testing was intact without dysmetria.   Gait: Normal and  steady.        Assessment:       1. Cervical dystonia    2. Cervical spine disease    3. Hx of migraine headaches    4. History of sleep apnea    5. Neck pain             Plan:      Procedures    1.  Ecotrin approval for Botox for cervical dystonia, with likely injections in the occipitalis and upper trapezius bilaterally, as well as the right sternocleidomastoid and left splenius capitis.  2.  Continue with the over-the-counter vitamin therapy for treatment of her migrainous headaches.  3.  Follow-up with spine doctor for additional injections if needed.  4.  We will try to appeal sleep study decision.  We need to get a home sleep study with her using the oral appliance to adequately assess whether her sleep apnea is treated.    5.  Continue with oral appliance.  6.  Follow-up in about 4-6 weeks.      The patient will return for follow-up as outlined above. If there are any problems with medications (if prescribed) or questions about any available test results, or if there are any new symptoms or changes in current symptoms, the patient is instructed to call the office.     Analyn Matusek B. Stacy Gardner, MD  Board Certified, Neurology  Board Certified, Clinical Neurophysiology  Board Certified, Vascular Neurology  Board Certified, Sleep Medicine

## 2016-05-20 NOTE — Progress Notes (Signed)
Fax 2 prescription that  pt left on the day of the offices visit

## 2016-05-23 ENCOUNTER — Encounter (INDEPENDENT_AMBULATORY_CARE_PROVIDER_SITE_OTHER): Payer: Self-pay | Admitting: Family Medicine

## 2016-05-27 ENCOUNTER — Encounter (INDEPENDENT_AMBULATORY_CARE_PROVIDER_SITE_OTHER): Payer: Self-pay | Admitting: Vascular Neurology

## 2016-05-29 ENCOUNTER — Encounter (INDEPENDENT_AMBULATORY_CARE_PROVIDER_SITE_OTHER): Payer: Self-pay | Admitting: Family Medicine

## 2016-05-29 ENCOUNTER — Ambulatory Visit (INDEPENDENT_AMBULATORY_CARE_PROVIDER_SITE_OTHER): Payer: 59 | Admitting: Family Medicine

## 2016-05-29 VITALS — BP 122/82 | HR 85 | Temp 98.4°F | Resp 17 | Ht 61.5 in | Wt 123.0 lb

## 2016-05-29 DIAGNOSIS — F419 Anxiety disorder, unspecified: Secondary | ICD-10-CM

## 2016-05-29 DIAGNOSIS — Z23 Encounter for immunization: Secondary | ICD-10-CM

## 2016-05-29 DIAGNOSIS — M5090 Cervical disc disorder, unspecified, unspecified cervical region: Secondary | ICD-10-CM

## 2016-05-29 DIAGNOSIS — L609 Nail disorder, unspecified: Secondary | ICD-10-CM

## 2016-05-29 NOTE — Patient Instructions (Addendum)
Teresa [name] -     Nice to meet you today.     We discussed your neck today. It does not seem like you have had symptoms of a stroke. However, if you feel weakness or trouble swallowing, or other concerning symptoms, please see a dr/call 911 right away. Please follow up with your neurologist for further workup of your neck. Please take tylenol 3x/day for the next week.     We discussed the possibility of psychiatry to help with coping with your life stressors.     Your fingernails do not show acute infection.    You got a flu shot today.

## 2016-05-29 NOTE — Progress Notes (Signed)
Subjective:      Patient ID: Teresa [name] is a 45 y.o. female.    Chief Complaint:  Chief Complaint   Patient presents with   . Pain     neck on left side       HPI:  HPI      Presents with husband, Reuel Boom.    Pt here for neck pain. Has seen neurology in the past, last appt 9/5. At that visit, was dx with dystonia with plan for botox. Has hx steroid shots in the neck, plan was to follow up with spine dr if more injections needed.     Yesterday she started to have numbness and tightness in the left side of her face, and down her arm, espcially digits 4, 5. No trouble swallowing yesterday, no slurred speech, no facial droop, no arm/leg weakness. She also has intermittent pain and numbness in the right 4, 5 digits. She was doing self massage yesterday and flet a "shockwave" when rubbing her neck. She notes this is the area of her disc degeneration/herniation. She uses a topical mix of baclofen/gabapentin for the muscle tightness, and tylenol BID. Comes in with an MRI report of the cervical spine. We reviewed this and corresponding dermatomes in detail. I asked that she submit for re-scanning into our chart, as although it was scanned 9/8, it was unavailable for viewing.     She thinks there may have been hypermobility/Ehlers-Danlos in the family, and will be seeing a geneticist for further workup. She was seeing PT which was helpful. She does home exercises. This has not affected her pain, but has increased strength.     Has a history of sensory overload which has been going on for years. Feels it is coupled with her ADHD. Does not see anyone currently for life stressors, saw therapist in past.     She also inquires about her fingernails which appear chipped to her. No injury. She is concerned about infection.       Problem List:  Patient Active Problem List   Diagnosis   . Colon cancer   . Hyperglycemia   . Midline low back pain without sciatica   . Family history of colon cancer requiring screening  colonoscopy   . Monoallelic mutation of POLD1 gene   . Attention deficit hyperactivity disorder (ADHD), combined type   . Iron deficiency anemia   . Vitamin D deficiency disease   . Vaginal dryness   . Hypercholesterolemia   . Hypermobility syndrome   . Neck pain   . Paresthesia   . Hx of migraine headaches   . History of sleep apnea   . Dysphagia, unspecified type   . Cervicalgia   . Cervical spine disease   . Cervical dystonia   . Hx of migraines   . Hx of sleep apnea   . Fibromyalgia       Current Medications:  Current Outpatient Prescriptions   Medication Sig Dispense Refill   . amitriptyline (ELAVIL) 10 MG tablet Take 1 tablet (10 mg total) by mouth nightly. 30 tablet 11   . Ascorbic Acid (VITAMIN C) 250 MG tablet Take 250 mg by mouth every other day.     Marland Kitchen buPROPion (WELLBUTRIN) 75 MG tablet Take 0.5 tablets (37.5 mg total) by mouth daily. Takes 37.5 mg daily (Patient taking differently: Take 18.75 mg by mouth daily.Takes 37.5 mg daily    ) 15 tablet 5   . Calcium Citrate 200 MG Tab Take 600 mg by  mouth daily.        Marland Kitchen CINNAMON PO Take by mouth.     . conjugated estrogens (PREMARIN) vaginal cream Place vaginally daily as needed. 30 g 5   . Digestive Enzymes (DIGESTIVE ENZYME PO) Take 1 tablet by mouth daily.        . diphenhydrAMINE (BENADRYL) 50 MG tablet Take 50 mg by mouth nightly as needed for Itching.     . ferrous sulfate 325 (65 FE) MG tablet Take 325 mg by mouth every other day.     Marland Kitchen L-Lysine 1000 MG Tab Take 1,300 mg by mouth daily.        Marland Kitchen L-Theanine 100 MG Cap Take 200 mg by mouth 2 (two) times daily.         Marland Kitchen MAGNESIUM CHLORIDE PO Take 150 mg by mouth nightly.     . methylphenidate (RITALIN) 5 MG tablet Take QID. May take an additional 2 pills daily if needed. Fill 60 days from date on rx 135 tablet 0   . montelukast (SINGULAIR) 4 MG Packet Take 8 mg by mouth nightly.        . Multiple Vitamin (MULTIVITAMIN) tablet Take 1 tablet by mouth daily.     . NON FORMULARY Apply 1 g topically 3 (three)  times daily.     . Omega-3 Fatty Acids (OMEGA-3 FISH OIL) 500 MG Cap Take 1,280 mg by mouth daily.        Marland Kitchen PREVIDENT 5000 BOOSTER PLUS 1.1 % Paste APPLY TO TEETH AFTER REGULAR BRUSHING AND FLOSSING  0   . Probiotic Product (SOLUBLE FIBER/PROBIOTICS PO) Take 1 tablet by mouth daily.        . Turmeric Curcumin 500 MG Cap Take by mouth.     . Ubiquinol 200 MG Cap Take 200 mg by mouth every morning.     . Riboflavin 100 MG Tab Take 200 mg by mouth daily. 60 each 5     No current facility-administered medications for this visit.        Allergies:  Allergies   Allergen Reactions   . Codeine Other (See Comments)     SOB/Supress Breathing   . Morphine Shortness Of Breath   . Advair Diskus [Fluticasone-Salmeterol]      confusion   . Epinephrine      Ok for emergencies, causes tachycardia   . Dairy [Milk-Related Compounds] Diarrhea   . Lidocaine Nausea Only and Tinitus   . Penicillins      Rxn unknown. Pt states allergy test done-allergic to penicillin Mold.    . Wheat Extract Diarrhea       Past Medical History:  Past Medical History:   Diagnosis Date   . ADHD (attention deficit hyperactivity disorder)    . Anemia     constant   . Anxiety    . Arrhythmia     pt states she belives anemia led to heart flutters in the past.   . Asthma    . Depression    . Dysphagia    . Eczema    . Gastroesophageal reflux disease    . Headache     migraine, sensitivity to light   . Heart burn    . Hypercholesterolemia 12/24/2015   . Malignant neoplasm of colon 2009    chemo   . Malignant neoplasm of skin 2007    basal   . Osteoporosis, idiopathic     Not sure.  Have had osteopenia in past but not now.   Marland Kitchen  Post-operative nausea and vomiting    . Psoriasis    . Seasonal allergic rhinitis    . Sleep apnea     does not use CPAP       Past Surgical History:  Past Surgical History:   Procedure Laterality Date   . APPENDECTOMY     . COLECTOMY  2009    partial   . COLONOSCOPY  2014   . COLONOSCOPY N/A 11/13/2014    Procedure: COLONOSCOPY;  Surgeon:  Huntley Estelle, MD;  Location: [id number];  Service: Gastroenterology;  Laterality: N/A;   . EGD  2014   . EGD, DILATION N/A 11/13/2014    Procedure: EGD, DILATION;  Surgeon: Huntley Estelle, MD;  Location: [id number];  Service: Gastroenterology;  Laterality: N/A;  EGD, DILATION/COLONOSCOPY W/IVA   . REDUCTION MAMMAPLASTY  2006   . uterine ablation   2011       Family History:  Family History   Problem Relation Age of Onset   . Colon cancer Father    . Cancer Father      colon   . Depression Mother    . Diabetes Mother      Pre-diabetes; not diabetes   . Early death Father      Age 36, colon cancer   . Heart disease Mother    . Hyperlipidemia Mother    . Obesity Maternal Aunt    . Vision loss Mother      cataracts and glaucoma       Social History:  Social History     Social History   . Marital status: Married     Spouse name: N/A   . Number of children: N/A   . Years of education: N/A     Occupational History   . Not on file.     Social History Main Topics   . Smoking status: Never Smoker   . Smokeless tobacco: Never Used   . Alcohol use No   . Drug use: No   . Sexual activity: Not Currently     Partners: Male     Birth control/ protection: Abstinence, Surgical     Other Topics Concern   . Not on file     Social History Narrative   . No narrative on file       The following portions of the patient's history were reviewed and updated as appropriate: allergies, current medications, past family history, past medical history, past social history, past surgical history and problem list.    ROS:  Review of Systems   Constitutional: Negative for fever.   HENT: Positive for trouble swallowing. Tinnitus: chronic, no worse in last few days.    Eyes: Negative for visual disturbance.   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    Musculoskeletal: Positive for neck pain. Negative for gait problem.   Skin:        Fingernails of 4th digit b/l with stunted  growth.    Allergic/Immunologic: Positive for food allergies (dairy, wheat).   Neurological: Positive for numbness. Negative for tremors, facial asymmetry, speech difficulty and weakness.   Psychiatric/Behavioral: The patient is nervous/anxious (anxiety, controlled on wellbutrin).         Vitals:  BP 122/82 (BP Site: Left arm)   Pulse 85   Temp 98.4 F (36.9 C) (Oral)   Resp 17   Ht 1.562 m (5' 1.5")   Wt 55.8 kg (123 lb)   SpO2 98%  BMI 22.86 kg/m     Objective:     Physical Exam:  Physical Exam   Constitutional: She appears well-developed and well-nourished.   HENT:   Head: Normocephalic and atraumatic.   Eyes: EOM are normal. Pupils are equal, round, and reactive to light.   Neck:   Muscle tension along L trapezius.    Cardiovascular: Normal rate and regular rhythm.  Exam reveals no friction rub.    No murmur heard.  Pulmonary/Chest: Effort normal and breath sounds normal. No respiratory distress. She has no wheezes. She has no rales.   Musculoskeletal: Normal range of motion.   Strength 5/5 in upper and lower extremities. Gait normal, heel and toe walk normal.    Neurological: She is alert. Coordination normal.   Mild sensory deficit over left side of face and left arm to both monofilament and tight touch. Otherwise, no CN deficit. Neg pronator drift. Neg romberg.    Skin: Skin is warm and dry.   B/l 4th finger nails eroded at edge. No erythema or edema. Nontender.    Psychiatric: She has a normal mood and affect. Her behavior is normal.   Slightly emotional when describing sensory overload and chronic medical conditions.    Vitals reviewed.      Assessment:     1. Flu vaccine need  - Flu vacc QUAD PRES FREE 3 YRS & GREATER    2. Cervical neck pain with evidence of disc disease  -follow-up with neurologist    3. Anxiety  -referral to psych if pt desires in future    4. Fingernail abnormalities  -f/u with dermatologist, not infected appearing      Plan:     [name], MD

## 2016-05-29 NOTE — Progress Notes (Signed)
Have you seen any specialists/other providers since your last visit with us?   NO      Limb alert protocol reviewed?      YES

## 2016-05-30 ENCOUNTER — Encounter (INDEPENDENT_AMBULATORY_CARE_PROVIDER_SITE_OTHER): Payer: Self-pay | Admitting: Family Medicine

## 2016-06-04 ENCOUNTER — Encounter (INDEPENDENT_AMBULATORY_CARE_PROVIDER_SITE_OTHER): Payer: Self-pay | Admitting: Vascular Neurology

## 2016-06-18 ENCOUNTER — Ambulatory Visit (INDEPENDENT_AMBULATORY_CARE_PROVIDER_SITE_OTHER): Payer: 59 | Admitting: Clinical Genetics (M.D.)

## 2016-06-18 ENCOUNTER — Encounter (INDEPENDENT_AMBULATORY_CARE_PROVIDER_SITE_OTHER): Payer: Self-pay | Admitting: Family Medicine

## 2016-06-18 DIAGNOSIS — M2550 Pain in unspecified joint: Secondary | ICD-10-CM

## 2016-06-18 NOTE — Progress Notes (Signed)
GENETIC CONSULTATION    Date Time: @IPTODAYDATE @ 3:22 PM  Patient Name: Teresa [name], Teresa [name]    Reason for Consultation:   Diffuse joint and body pain    Assessment:   Pt is a 45 yo female who comes in with complaints of diffuse joint pain. 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 Pt states she could do splits as a child. She frequently feels her left shoulder subluxate and pushes it back in. She feels her ankles are unstable and she trips frequently. She has not noticed poor wound healing, she does not have hyperextensible skin, but feels she bruises easily. She also feels she had stretch marks earlier than other peers her age. She has diffuse body pain, she has arthritis and degenerative disc disease of the cervical vertebrae.   Today she is able to place palms on the floor, she is able to touch left thumb to forearm. I do not percieve adequate elbow hyperextensibility, 5th finger hyperextension, or knee extensibility. I give her 2 points on the Beighton scale. She feels she was more flexible as a child, and likely earned all Beighton scale points.    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 Clearly we need to discover more genes, or be more exclusive with criteria for patients tested. There are panels available for connective tissue disorders. We may have better testing in the future. Also, there is no cure or directed treatment, patients find therapies or activities that help with symptoms on an individual basis.    The patient would like to see if her insurance would cover testing, therefore we will submit an authorization to her insurance and await the answer.    Recommendations:   Pre-auth for a connective tissue panel to [facility].     We will contact  family with testing results (if applicable).       History:   Teresa [name] is a 45 y.o. female who presents to the clinic today for a history of diffuse joint and body pain. Hyperflexible joints.    For further history details, see the Past Medical History, Past Surgical History, and Assessment sections.       Past Medical History:     Colon cancer at age 62 yrs.   Dysphagia for 10 years, central and obstructive sleep apnea, palpitations.  Vision disturbances for last 10 years.   Past Surgical History:   Breast reduction  Colon cancer    Family History/ Social history:   The patient has no children. Her brother is about 22 yrs of age, no joint flexibility.  Father died of colon cancer at 25 yrs of age. He had flexible joints.  Mother does not have flexible joints.    Medications:       Current Outpatient Prescriptions   Medication Sig Dispense Refill   . amitriptyline (ELAVIL) 10 MG tablet Take 1 tablet (10 mg total) by mouth nightly. 30 tablet 11   . Ascorbic Acid (VITAMIN C) 250 MG tablet Take 250 mg by mouth every other day.     Marland Kitchen buPROPion (WELLBUTRIN) 75 MG tablet Take 0.5 tablets (37.5 mg total) by mouth daily. Takes 37.5 mg daily (Patient taking differently: Take 18.75 mg by mouth daily.Takes 37.5 mg daily    ) 15 tablet  5   . Calcium Citrate 200 MG Tab Take 600 mg by mouth daily.        Marland Kitchen CINNAMON PO Take by mouth.     . conjugated estrogens (PREMARIN) vaginal cream Place vaginally daily as needed. 30 g 5   . Digestive Enzymes (DIGESTIVE ENZYME PO) Take 1 tablet by mouth daily.        . diphenhydrAMINE (BENADRYL) 50 MG tablet Take 50 mg by mouth nightly as needed for Itching.     . ferrous sulfate 325 (65 FE) MG tablet Take 325 mg by mouth every other day.     Marland Kitchen L-Lysine 1000 MG Tab Take 1,300 mg by mouth daily.        Marland Kitchen L-Theanine 100 MG Cap Take 200 mg by mouth 2 (two) times daily.         Marland Kitchen MAGNESIUM CHLORIDE PO Take 150 mg by mouth nightly.     . methylphenidate (RITALIN) 5 MG tablet Take QID.  May take an additional 2 pills daily if needed. Fill 60 days from date on rx 135 tablet 0   . montelukast (SINGULAIR) 4 MG Packet Take 8 mg by mouth nightly.        . Multiple Vitamin (MULTIVITAMIN) tablet Take 1 tablet by mouth daily.     . NON FORMULARY Apply 1 g topically 3 (three) times daily.     . Omega-3 Fatty Acids (OMEGA-3 FISH OIL) 500 MG Cap Take 1,280 mg by mouth daily.        Marland Kitchen PREVIDENT 5000 BOOSTER PLUS 1.1 % Paste APPLY TO TEETH AFTER REGULAR BRUSHING AND FLOSSING  0   . Probiotic Product (SOLUBLE FIBER/PROBIOTICS PO) Take 1 tablet by mouth daily.        . Riboflavin 100 MG Tab Take 200 mg by mouth daily. 60 each 5   . Turmeric Curcumin 500 MG Cap Take by mouth.     . Ubiquinol 200 MG Cap Take 200 mg by mouth every morning.       No current facility-administered medications for this visit.         Review of Systems:   Dry eyes  Nystagmus  Twitching eyelids  Vision disturbances  Arrhythmia/palpitations  Dysphagia  Central and obstructive sleep apnea  Migraines with auras  Eczema  Easy bruising  Cervical disc disease  Fatigue  Fibromyalgia  GERD  Allergies      All other systems were reviewed and are negative except as previously noted in the HPI.    Physical Exam:   There were no vitals taken for this visit.    DYSMORPHOLOGY EXAM:   General: Alert  Head: Normocephalic, no asymmetry, normal fontanelles and sutures   Hair: Normal anterior and posterior hairlines, no whorl anomalies appreciated  Eyes: Neutral palpebral fissures, no epicanthal folds, no intraocular anomalies appreciated  Ears: Not low-set or posteriorly angulated, no pits/tags/dimples appreciated  Nose: Normal nasal bridge, nares not anteverted  Mouth: No philtral or palatal anomalies appreciated  Neck: Not short or wide, no nuchal redundancy appreciated  Chest: Normal intermamillary distance, no pectus anomalies appreciated  Abdomen: No hepatosplenomegaly or mass    Extremities: Normal palmar creases, no  brachy/clino/oligo/poly/syndactyly appreciated, no limb reduction deficits, scores 2 points today on Beighton scale, palms to floor, thumb to forearm on left.   Spine: No curvature appreciated, no sacral dimple  Skin: No suspicious lesions appreciated, no hyperextensibility  Neuro: No tone anomalies or focal deficits noted    Labs Reviewed:  Results     ** No results found for the last 24 hours. **            Rads:   Radiological Procedure reviewed.       This case was discussed with patient.  Full consultation, including discussions with these individuals, examination, medical record review, and literature-based analysis required > 60 minutes, of which > 50% was face-to-face.    Signed by: Sheppard Plumber, MD  Chief, Division of Medical [facility]

## 2016-07-03 ENCOUNTER — Ambulatory Visit (INDEPENDENT_AMBULATORY_CARE_PROVIDER_SITE_OTHER): Payer: 59 | Admitting: Vascular Neurology

## 2016-07-03 ENCOUNTER — Encounter (INDEPENDENT_AMBULATORY_CARE_PROVIDER_SITE_OTHER): Payer: Self-pay

## 2016-07-04 ENCOUNTER — Other Ambulatory Visit (INDEPENDENT_AMBULATORY_CARE_PROVIDER_SITE_OTHER): Payer: Self-pay

## 2016-07-04 ENCOUNTER — Ambulatory Visit (INDEPENDENT_AMBULATORY_CARE_PROVIDER_SITE_OTHER): Payer: Self-pay | Admitting: Family Medicine

## 2016-07-04 DIAGNOSIS — G4730 Sleep apnea, unspecified: Secondary | ICD-10-CM

## 2016-07-07 ENCOUNTER — Other Ambulatory Visit (INDEPENDENT_AMBULATORY_CARE_PROVIDER_SITE_OTHER): Payer: Self-pay

## 2016-07-07 DIAGNOSIS — Z0000 Encounter for general adult medical examination without abnormal findings: Secondary | ICD-10-CM

## 2016-07-23 ENCOUNTER — Ambulatory Visit (INDEPENDENT_AMBULATORY_CARE_PROVIDER_SITE_OTHER): Payer: 59 | Admitting: Vascular Neurology

## 2016-07-25 ENCOUNTER — Ambulatory Visit: Payer: 59 | Attending: Neurology

## 2016-07-25 DIAGNOSIS — G479 Sleep disorder, unspecified: Secondary | ICD-10-CM

## 2016-07-25 DIAGNOSIS — Z8669 Personal history of other diseases of the nervous system and sense organs: Secondary | ICD-10-CM

## 2016-07-28 ENCOUNTER — Other Ambulatory Visit (HOSPITAL_BASED_OUTPATIENT_CLINIC_OR_DEPARTMENT_OTHER): Payer: 59

## 2016-07-28 DIAGNOSIS — G4752 REM sleep behavior disorder: Secondary | ICD-10-CM

## 2016-07-28 DIAGNOSIS — Z8669 Personal history of other diseases of the nervous system and sense organs: Secondary | ICD-10-CM

## 2016-07-28 NOTE — Progress Notes (Signed)
HST of adequate duration completed. Pt educated on use of device and demonstrated understanding

## 2016-07-30 NOTE — Procedures (Signed)
Home Sleep Study Report     Date of study: July 25, 2016    Indication: History of headaches, sleep apnea    Medications: None listed    Summary of findings:     The total study time was 9 hours and 17 minutes, with a sleep time of 6 hours and 46 minutes.  The percent of sleep spent in REM sleep was 13.2 %.  The Respiratory Distress Index (RDI) for the study was 9.4, with an Apnea Hypopnea Index (AHI) of 2.6. The Oxygen Desaturation Index (ODI) was 3.0.  The minimum oxygen saturation was 84 %, with a mean oxygen saturation of 96 %.  The mean pulse rate was 74.     The patient slept in the right and left sided positions, primarily, minimally in the supine position.  The percent of sleep spent snoring and a dB level of greater than 40 dB was 6.0 %.     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  Conservative measures may include avoiding sleeping on the right or left side, over-the-counter remedies for snoring, and weight loss as appropriate.  Correlation recommended    Wiliam Cauthorn B. Stacy Gardner, MD  [facility]

## 2016-07-30 NOTE — Progress Notes (Signed)
Report is done.

## 2016-08-01 ENCOUNTER — Encounter (INDEPENDENT_AMBULATORY_CARE_PROVIDER_SITE_OTHER): Payer: Self-pay | Admitting: Family Medicine

## 2016-08-01 ENCOUNTER — Ambulatory Visit (FREE_STANDING_LABORATORY_FACILITY): Payer: 59 | Admitting: Family Medicine

## 2016-08-01 VITALS — BP 108/66 | HR 86 | Temp 99.3°F | Ht 62.0 in | Wt 125.0 lb

## 2016-08-01 DIAGNOSIS — D508 Other iron deficiency anemias: Secondary | ICD-10-CM

## 2016-08-01 DIAGNOSIS — F902 Attention-deficit hyperactivity disorder, combined type: Secondary | ICD-10-CM

## 2016-08-01 DIAGNOSIS — G2581 Restless legs syndrome: Secondary | ICD-10-CM

## 2016-08-01 DIAGNOSIS — Z8669 Personal history of other diseases of the nervous system and sense organs: Secondary | ICD-10-CM

## 2016-08-01 LAB — IRON PROFILE
Iron Saturation: 24 % (ref 15–50)
Iron: 83 ug/dL (ref 40–145)
TIBC: 347 ug/dL (ref 265–497)
UIBC: 264 ug/dL (ref 126–382)

## 2016-08-01 LAB — FERRITIN: Ferritin: 63.78 ng/mL (ref 4.60–204.00)

## 2016-08-01 LAB — HEMOLYSIS INDEX: Hemolysis Index: 9 (ref 0–18)

## 2016-08-01 MED ORDER — METHYLPHENIDATE HCL 5 MG PO TABS
ORAL_TABLET | ORAL | 0 refills | Status: DC
Start: 2016-08-01 — End: 2016-09-16

## 2016-08-01 MED ORDER — BUPROPION HCL 75 MG PO TABS
ORAL_TABLET | ORAL | 4 refills | Status: DC
Start: 2016-08-01 — End: 2017-08-17

## 2016-08-01 MED ORDER — METHYLPHENIDATE HCL 5 MG PO TABS
ORAL_TABLET | ORAL | 0 refills | Status: DC
Start: 2016-08-01 — End: 2016-11-19

## 2016-08-01 NOTE — Assessment & Plan Note (Signed)
A/P: Improved.  Continue to monitor with neurology.

## 2016-08-01 NOTE — Progress Notes (Signed)
Have you seen any specialists/other providers since your last visit with [place]?    Yes , Eye doctor      Limb alert protocol reviewed?      Yes

## 2016-08-01 NOTE — Progress Notes (Signed)
Subjective:      Patient ID: Teresa [name]  is a 45 y.o.  female.     Chief Complaint   Patient presents with   . Medication Refill     Ritalin.         HPI        Problem   Restless Leg Syndrome    She is working with her neurologist on this.  Iron seems to help.      Hx of Migraine Headaches    Has migraines infrequently now.  She is working with the neurologist who suggested riboflavin.  She has not started that yet.  Decreasing her Wellbutrin has seemed to help.     Attention Deficit Hyperactivity Disorder (Adhd), Combined Type    Diagnosed many years ago.  Has been doing well on Ritalin 5mg  QID to six times a day. Long acting hasn't worked well in the past and she has not wanted to try Adderall.  She is taking wellbutrin as well, but she has been weaning medication due to SE migraine.  She is now down to Wellbutrin one quarter tablet daily.     Iron Deficiency Anemia    Iron deficiency anemia for the last several years.  She supplements with over-the-counter iron.  She has been decreasing her dose because her levels were catching up.  Her neurologist suggested she get it tested every 3 months because of the effect on restless leg syndrome.     Hx of Migraines (Resolved)        Past Medical History:   Diagnosis Date   . ADHD (attention deficit hyperactivity disorder)    . Anemia     constant   . Anxiety    . Arrhythmia     pt states she belives anemia led to heart flutters in the past.   . Asthma    . Depression    . Dysphagia    . Eczema    . Gastroesophageal reflux disease    . Headache     migraine, sensitivity to light   . Heart burn    . Hypercholesterolemia 12/24/2015   . Malignant neoplasm of colon 2009    chemo   . Malignant neoplasm of skin 2007    basal   . Osteoporosis, idiopathic     Not sure.  Have had osteopenia in past but not now.   . Post-operative nausea and vomiting    . Psoriasis    . Seasonal allergic rhinitis    . Sleep apnea     does not use CPAP       Review of Systems   No  fevers, no chest pain, no rash      Objective:     BP 108/66 (BP Site: Left arm, Patient Position: Sitting, Cuff Size: Medium)   Pulse 86   Temp 99.3 F (37.4 C) (Oral)   Ht 1.575 m (5\' 2" )   Wt 56.7 kg (125 lb)   SpO2 100%   BMI 22.86 kg/m     Physical Exam   Constitutional: She is oriented to person, place, and time.   HENT:   Mouth/Throat: Oropharynx is clear and moist.   Eyes: Conjunctivae are normal.   Cardiovascular: Normal rate.    Pulmonary/Chest: Effort normal.   Neurological: She is alert and oriented to person, place, and time.   Psychiatric: She has a normal mood and affect. Her behavior is normal. Thought content normal.     Vital signs reviewed  Assessment and Plan:     Attention deficit hyperactivity disorder (ADHD), combined type  A/P: Stable.  Continue current dosing.  Refilled 3 months.    Iron deficiency anemia  A/P: Stable.  Recheck levels.    Restless leg syndrome  A/P: Improved with iron tablets.  She is continued to work with her neurologist.    Hx of migraine headaches  A/P: Improved.  Continue to monitor with neurology.    Discussed red flags to return for reevaluation.  Risk & Benefits of any previous or new medication(s) were explained to the patient who verbalized understanding & agreed to the treatment plan.     Call with updates/questions/concerns.    Ellery Plunk, M.D.

## 2016-08-01 NOTE — Assessment & Plan Note (Signed)
A/P: Stable.  Continue current dosing.  Refilled 3 months.

## 2016-08-01 NOTE — Assessment & Plan Note (Signed)
A/P: Improved with iron tablets.  She is continued to work with her neurologist.

## 2016-08-01 NOTE — Assessment & Plan Note (Signed)
A/P: Stable.  Recheck levels.

## 2016-08-05 ENCOUNTER — Encounter (INDEPENDENT_AMBULATORY_CARE_PROVIDER_SITE_OTHER): Payer: Self-pay | Admitting: Family Medicine

## 2016-08-06 ENCOUNTER — Encounter (INDEPENDENT_AMBULATORY_CARE_PROVIDER_SITE_OTHER): Payer: Self-pay | Admitting: Vascular Neurology

## 2016-08-06 NOTE — Progress Notes (Signed)
botox approved 06/09/16 - 06/09/17 Auth # [id number]

## 2016-08-14 ENCOUNTER — Encounter (INDEPENDENT_AMBULATORY_CARE_PROVIDER_SITE_OTHER): Payer: Self-pay | Admitting: Family Medicine

## 2016-08-15 ENCOUNTER — Other Ambulatory Visit (INDEPENDENT_AMBULATORY_CARE_PROVIDER_SITE_OTHER): Payer: Self-pay | Admitting: Hematology & Oncology

## 2016-08-15 ENCOUNTER — Ambulatory Visit
Admission: RE | Admit: 2016-08-15 | Discharge: 2016-08-15 | Disposition: A | Discharge status: Home / Self Care | Payer: 59 | Source: Ambulatory Visit | Attending: Hematology & Oncology | Admitting: Hematology & Oncology

## 2016-08-15 DIAGNOSIS — D259 Leiomyoma of uterus, unspecified: Secondary | ICD-10-CM | POA: Insufficient documentation

## 2016-08-15 DIAGNOSIS — C189 Malignant neoplasm of colon, unspecified: Secondary | ICD-10-CM

## 2016-08-15 DIAGNOSIS — Z1509 Genetic susceptibility to other malignant neoplasm: Secondary | ICD-10-CM | POA: Insufficient documentation

## 2016-08-15 DIAGNOSIS — Z800 Family history of malignant neoplasm of digestive organs: Secondary | ICD-10-CM

## 2016-08-29 ENCOUNTER — Encounter (INDEPENDENT_AMBULATORY_CARE_PROVIDER_SITE_OTHER): Payer: Self-pay | Admitting: Family Medicine

## 2016-08-29 ENCOUNTER — Telehealth (INDEPENDENT_AMBULATORY_CARE_PROVIDER_SITE_OTHER): Payer: Self-pay

## 2016-08-29 ENCOUNTER — Other Ambulatory Visit (INDEPENDENT_AMBULATORY_CARE_PROVIDER_SITE_OTHER): Payer: Self-pay

## 2016-08-29 ENCOUNTER — Other Ambulatory Visit (FREE_STANDING_LABORATORY_FACILITY): Payer: 59

## 2016-08-29 ENCOUNTER — Ambulatory Visit (INDEPENDENT_AMBULATORY_CARE_PROVIDER_SITE_OTHER): Payer: Self-pay

## 2016-08-29 DIAGNOSIS — C189 Malignant neoplasm of colon, unspecified: Secondary | ICD-10-CM

## 2016-08-29 DIAGNOSIS — Z0110 Encounter for examination of ears and hearing without abnormal findings: Secondary | ICD-10-CM

## 2016-08-29 LAB — CBC AND DIFFERENTIAL
Absolute NRBC: 0 10*3/uL
Basophils Absolute Automated: 0.05 10*3/uL (ref 0.00–0.20)
Basophils Automated: 0.7 %
Eosinophils Absolute Automated: 0.06 10*3/uL (ref 0.00–0.70)
Eosinophils Automated: 0.8 %
Hematocrit: 42.3 % (ref 37.0–47.0)
Hgb: 13.2 g/dL (ref 12.0–16.0)
Immature Granulocytes Absolute: 0.01 10*3/uL
Immature Granulocytes: 0.1 %
Lymphocytes Absolute Automated: 1.79 10*3/uL (ref 0.50–4.40)
Lymphocytes Automated: 25.1 %
MCH: 30.2 pg (ref 28.0–32.0)
MCHC: 31.2 g/dL — ABNORMAL LOW (ref 32.0–36.0)
MCV: 96.8 fL (ref 80.0–100.0)
MPV: 10.2 fL (ref 9.4–12.3)
Monocytes Absolute Automated: 0.61 10*3/uL (ref 0.00–1.20)
Monocytes: 8.5 %
Neutrophils Absolute: 4.62 10*3/uL (ref 1.80–8.10)
Neutrophils: 64.8 %
Nucleated RBC: 0 /100 WBC (ref 0.0–1.0)
Platelets: 332 10*3/uL (ref 140–400)
RBC: 4.37 10*6/uL (ref 4.20–5.40)
RDW: 12 % (ref 12–15)
WBC: 7.14 10*3/uL (ref 3.50–10.80)

## 2016-08-29 NOTE — Telephone Encounter (Signed)
Pt calls.  She tried to get labs done on orders that were placed last January but lab says orders have expired.  RN entered orders for CEA, CA 125, CMP, and CBC.

## 2016-08-29 NOTE — Progress Notes (Signed)
Nurse visit- Hearing test per pt requests

## 2016-08-30 ENCOUNTER — Other Ambulatory Visit (INDEPENDENT_AMBULATORY_CARE_PROVIDER_SITE_OTHER): Payer: Self-pay | Admitting: Family Medicine

## 2016-08-30 LAB — CA 125: CA 125: 12.7 (ref 0.0–35.0)

## 2016-08-30 LAB — COMPREHENSIVE METABOLIC PANEL
ALT: 22 U/L (ref 0–55)
AST (SGOT): 29 U/L (ref 5–34)
Albumin/Globulin Ratio: 1.3 (ref 0.9–2.2)
Albumin: 4.2 g/dL (ref 3.5–5.0)
Alkaline Phosphatase: 75 U/L (ref 37–106)
BUN: 13 mg/dL (ref 7.0–19.0)
Bilirubin, Total: 0.4 mg/dL (ref 0.1–1.2)
CO2: 25 mEq/L (ref 21–29)
Calcium: 9.7 mg/dL (ref 8.5–10.5)
Chloride: 104 mEq/L (ref 100–111)
Creatinine: 0.7 mg/dL (ref 0.4–1.5)
Globulin: 3.2 g/dL (ref 2.0–3.7)
Glucose: 103 mg/dL — ABNORMAL HIGH (ref 70–100)
Potassium: 4.4 mEq/L (ref 3.5–5.1)
Protein, Total: 7.4 g/dL (ref 6.0–8.3)
Sodium: 141 mEq/L (ref 136–145)

## 2016-08-30 LAB — GFR: EGFR: >60

## 2016-08-30 LAB — HEMOLYSIS INDEX: Hemolysis Index: 4 (ref 0–18)

## 2016-08-30 LAB — CEA: CEA: 3.2 ng/mL (ref 0.0–5.0)

## 2016-08-30 MED ORDER — MONTELUKAST SODIUM 4 MG PO PACK
8.0000 mg | PACK | Freq: Every evening | ORAL | 1 refills | Status: DC
Start: 2016-08-30 — End: 2016-09-29

## 2016-09-13 ENCOUNTER — Encounter (INDEPENDENT_AMBULATORY_CARE_PROVIDER_SITE_OTHER): Payer: Self-pay | Admitting: Hematology & Oncology

## 2016-09-15 ENCOUNTER — Telehealth (INDEPENDENT_AMBULATORY_CARE_PROVIDER_SITE_OTHER): Payer: Self-pay | Admitting: Hematology & Oncology

## 2016-09-15 NOTE — Telephone Encounter (Signed)
Discussed CEA and CA 125 results.    Is there a way to allow her access to these through My Chart?

## 2016-09-16 ENCOUNTER — Encounter (INDEPENDENT_AMBULATORY_CARE_PROVIDER_SITE_OTHER): Payer: Self-pay | Admitting: Hematology & Oncology

## 2016-09-16 ENCOUNTER — Encounter (INDEPENDENT_AMBULATORY_CARE_PROVIDER_SITE_OTHER): Payer: Self-pay | Admitting: Family Medicine

## 2016-09-16 ENCOUNTER — Ambulatory Visit (INDEPENDENT_AMBULATORY_CARE_PROVIDER_SITE_OTHER): Payer: 59 | Admitting: Family Medicine

## 2016-09-16 VITALS — BP 118/76 | HR 106 | Temp 99.6°F | Wt 128.6 lb

## 2016-09-16 DIAGNOSIS — J069 Acute upper respiratory infection, unspecified: Secondary | ICD-10-CM

## 2016-09-16 DIAGNOSIS — B9789 Other viral agents as the cause of diseases classified elsewhere: Secondary | ICD-10-CM

## 2016-09-16 MED ORDER — BENZONATATE 200 MG PO CAPS
200.0000 mg | ORAL_CAPSULE | Freq: Three times a day (TID) | ORAL | 0 refills | Status: DC | PRN
Start: 2016-09-16 — End: 2016-11-19

## 2016-09-16 NOTE — Patient Instructions (Signed)
Lezly -     We talked about salt water gargles, hot tea and honey.  Please use the Tessalon Perles for cough.

## 2016-09-16 NOTE — Progress Notes (Signed)
Have you seen any specialists/other providers since your last visit with [place]?    Yes , [place] Neurologist at [facility]      Limb alert protocol reviewed?      No

## 2016-09-16 NOTE — Progress Notes (Addendum)
Subjective:      Patient ID: Teresa [name] is a 46 y.o. female     Chief Complaint   Patient presents with   . Chest congestion     c/o chest congestion started pass Thursday, green mucus this morning.   . Cough        HPI     Thursday started to feel sick. She has been coughing a lot, productive of thick yellow/brown mucus. No fever. No lymph nodes. Feels like has chest pain from coughing. Has sore throat thinks from coughing. Voice hoarse. Taking robitussin OTC, helps a little bit.  Mild SOB. Feels like she will cough with deep breathing. She works as an Probation officer job.     The following sections were reviewed this encounter by the provider:   Allergies  Meds  Problems         Review of Systems    Sleep is affected due to coughing. Appetite is normal, slightly less than normal, if that.      BP 118/76 (BP Site: Left arm, Patient Position: Sitting, Cuff Size: Medium)   Pulse (!) 106   Temp 99.6 F (37.6 C) (Tympanic)   Wt 58.3 kg (128 lb 9.6 oz)   SpO2 99%   BMI 23.52 kg/m      Objective:     Physical Exam   Constitutional: She appears well-developed and well-nourished.   HENT:   Head: Normocephalic and atraumatic.   Mouth/Throat: Oropharynx is clear and moist. No oropharyngeal exudate.   Cardiovascular: Regular rhythm.    No murmur heard.  Slightly tachycardic   Pulmonary/Chest: Effort normal and breath sounds normal. She has no wheezes. She has no rales.   Frequent cough   Lymphadenopathy:     She has no cervical adenopathy.   Neurological: She is alert.   Skin: Skin is warm and dry.   Psychiatric: She has a normal mood and affect. Her behavior is normal.   Nursing note and vitals reviewed.      Assessment:     1. Viral upper respiratory tract infection        Plan:     Patient Instructions   Krishika -     We talked about salt water gargles, hot tea and honey.  Please use the Tessalon Perles for cough.      [name], MD

## 2016-09-16 NOTE — Telephone Encounter (Signed)
Released results through MyChart

## 2016-09-19 ENCOUNTER — Ambulatory Visit (INDEPENDENT_AMBULATORY_CARE_PROVIDER_SITE_OTHER): Payer: 59 | Admitting: Family Medicine

## 2016-09-19 ENCOUNTER — Encounter (INDEPENDENT_AMBULATORY_CARE_PROVIDER_SITE_OTHER): Payer: Self-pay | Admitting: Family Medicine

## 2016-09-19 VITALS — BP 122/78 | HR 91 | Temp 98.5°F | Wt 128.4 lb

## 2016-09-19 DIAGNOSIS — B9789 Other viral agents as the cause of diseases classified elsewhere: Secondary | ICD-10-CM

## 2016-09-19 DIAGNOSIS — J069 Acute upper respiratory infection, unspecified: Secondary | ICD-10-CM

## 2016-09-19 MED ORDER — AZITHROMYCIN 250 MG PO TABS
ORAL_TABLET | ORAL | 0 refills | Status: AC
Start: 2016-09-19 — End: 2016-09-24

## 2016-09-19 MED ORDER — ALBUTEROL SULFATE HFA 108 (90 BASE) MCG/ACT IN AERS
2.0000 | INHALATION_SPRAY | RESPIRATORY_TRACT | 0 refills | Status: AC | PRN
Start: 2016-09-19 — End: ?

## 2016-09-19 NOTE — Patient Instructions (Signed)
Madine -     You are slowly improving. As you have a trip coming up, please fill the abx if you need to. Use the albuterol as needed.

## 2016-09-19 NOTE — Progress Notes (Signed)
Subjective:      Patient ID: Teresa [name] is a 46 y.o. female     Chief Complaint   Patient presents with   . Cough     c/o cough       HPI     Patient was seen 3 days ago for an upper respiratory infection.  At that time it was assumed be viral.  She was given Jerilynn Som and conservative measures.    She feels she is just a little bit better. She feels the tessalon is not really not working. It is worse at night, she has continuous coughing. She has a hx of asthma, thinks this may be contributing. The cough is generally dry, except when she has guaifenesin. She has occ chest pain with coughing. Feels she coughs less on her right side. Angling up helps. Hot beverages does help regardless of honey. She is leaving for [facility] Tuesday. She has been home and working remotely. Sh wants to avoid abx. She is wheezing, which is not typical for her asthma. She has used steroid inhalers, but did not like the last one she tried, maybe advair. She has used nasal steroids, She feels this is more in the lungs than in the head. She has used ocean mist sprays which help temporarily.     Seh has had an adverse rxn to advair, believes steroid component. Thinks albuterol hs been ok in past.     Never smoker.     The following sections were reviewed this encounter by the provider:   Allergies  Meds  Problems         Review of Systems   No fevers. Does not feel congestion is worse than her baseline allergies.       BP 122/78 (BP Site: Left arm, Patient Position: Sitting, Cuff Size: Medium)   Pulse 91   Temp 98.5 F (36.9 C) (Oral)   Wt 58.2 kg (128 lb 6.4 oz)   SpO2 95%   BMI 23.48 kg/m      Objective:     Physical Exam   Constitutional: She appears well-developed and well-nourished.   HENT:   Head: Normocephalic and atraumatic.   Nose: Mucosal edema present.   Mouth/Throat: Oropharynx is clear and moist. No oropharyngeal exudate.   Mild frontal/maxillary tenderness, not above b/l   Eyes: Pupils are equal, round,  and reactive to light.   Cardiovascular: Normal rate, regular rhythm and normal heart sounds.  Exam reveals no friction rub.    No murmur heard.  Pulmonary/Chest: Effort normal and breath sounds normal. No respiratory distress. She has no wheezes. She has no rales.   Lymphadenopathy:     She has no cervical adenopathy.   Neurological: She is alert.   Skin: Skin is warm and dry.   Psychiatric: She has a normal mood and affect. Her behavior is normal.   Nursing note and vitals reviewed.    Assessment:     1. Viral upper respiratory tract infection    -acute problem, second visit, slow improvement, abx if improvement stagnant, albuterol for wheezing PRN    Plan:     Patient Instructions   Teresa [name] -     You are slowly improving. As you have a trip coming up, please fill the abx if you need to. Use the albuterol as needed.       Teresa Koyanagi, MD

## 2016-09-19 NOTE — Progress Notes (Signed)
Have you seen any specialists/other providers since your last visit with us?    No      Limb alert protocol reviewed?      Yes

## 2016-09-29 ENCOUNTER — Ambulatory Visit (INDEPENDENT_AMBULATORY_CARE_PROVIDER_SITE_OTHER): Payer: 59 | Admitting: Hematology & Oncology

## 2016-09-29 ENCOUNTER — Encounter (INDEPENDENT_AMBULATORY_CARE_PROVIDER_SITE_OTHER): Payer: Self-pay | Admitting: Family Medicine

## 2016-09-29 MED ORDER — MONTELUKAST SODIUM 4 MG PO PACK
8.0000 mg | PACK | Freq: Every evening | ORAL | 4 refills | Status: DC
Start: 2016-09-29 — End: 2017-12-06

## 2016-10-01 ENCOUNTER — Ambulatory Visit (INDEPENDENT_AMBULATORY_CARE_PROVIDER_SITE_OTHER): Payer: 59 | Admitting: Hematology & Oncology

## 2016-10-01 ENCOUNTER — Encounter (INDEPENDENT_AMBULATORY_CARE_PROVIDER_SITE_OTHER): Payer: Self-pay | Admitting: Hematology & Oncology

## 2016-10-01 VITALS — BP 110/74 | HR 112 | Temp 98.1°F | Wt 129.4 lb

## 2016-10-01 DIAGNOSIS — C189 Malignant neoplasm of colon, unspecified: Secondary | ICD-10-CM

## 2016-10-01 NOTE — Addendum Note (Signed)
Addended byRoselyn Meier, Neosha Switalski A on: 10/01/2016 03:39 PM     Modules accepted: Orders

## 2016-10-01 NOTE — Progress Notes (Signed)
Subjective:       Patient ID: Teresa [name] is a 46 y.o. female.    Interval Hx:  Feeling well. She has some fibrous lesions on the sole of her foot.      HPI  46 year old female with a history of T3N0M0 colon cancer and a strong family history of colon cancer and basal cell cancer presents for follow up.  She was diagnosed with colon cancer in 2009 in [place]. 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 She had 0/34 positive lymph nodes. She received 12 cycles of adjuvant chemotherapy with 5-FU/Leucovorin.    Her microsatellite IHC was normal. Her father died of colon cancer at 20, and her only brother had polyps. She had an [facility] colon cancer panel that was reportedly normal other than a POLB Leu259Ser.    She is followed for iron deficiency anemia of unclear origin, that has resolved. She had taken prophylactic aspirin for a time, but discontinued this around 2014. She had a capsule endoscopy in 2014, that was normal ([facility]).    Her recurrence screening included Q2 year MRI for the first 5 years as she refused CT scans.    She also has a history of basal cell cancer x2. Her mother has a history of basal cell cancer as well. She has had benign uterine polyps.    She takes multiple supplements (scanned into EPIC) that include Curcumin, ish Oil, I-theanine, I-lysine, Probioatics, Digestive enzymes.    COLONOSCOPY:  Feb. 2016 with Dr. Theodora Blow    EGD  Feb 2016-gastric polyps-repeat suggested in 2018.    PMH:  Allergies/seasonal  ADHD  Depression  Colon cancer  Basal cell cancer        Review of Systems  General: No acute distress  HEENT: no thrush seen. No oropharyngeal masses.  No lymphadenopathy in neck appreciated.  Lungs clear to auscultation B/L.  Regular Rate and rhythm.  Abdomen soft, non-tender, non-distended.  No clubbing, cyanosis, or edema.  Neuro exam without focal weakness.      Objective:    Physical Exam   BP 110/74   Pulse  (!) 112   Temp 98.1 F (36.7 C) (Oral)   Wt 58.7 kg (129 lb 6.4 oz)   BMI 23.67 kg/m   General: No acute distress  HEENT: no thrush seen. No oropharyngeal masses.  No lymphadenopathy in neck appreciated.  Lungs clear to auscultation B/L.  Regular Rate and rhythm.  Abdomen soft, non-tender, non-distended.  No clubbing, cyanosis, or edema.  Neuro exam without focal weakness.      Assessment:       46 year old female with a history of right sided T3N0 colon cancer in 2009, and with a strong family history. She tested negative for Lynch Syndrome. She has no evidence of disease at this time.      Plan:       Given the very strong family history and her early presentation, continue Lynch-like screening.  -CEA, CA 125 yearly. (last normal in December 2017)  -Colonoscopy Q2 years (referral to [facility]). Her next will be in the spring of 2018.  -Pelvic U/S and GYN exam yearly. U/S was done and is normal. Dr. Dareen Piano group  -Qyear follow up.  -For anemia work up, Iron profile, CBC, B12/folate, iron profile. THis will be done in June December, and she will visit with me each January.      -No indication for ongoing  body MRI at this point (7+ years from diagnosis).

## 2016-10-01 NOTE — Addendum Note (Signed)
Addended by: Marlaine Hind on: 10/01/2016 03:39 PM     Modules accepted: Orders

## 2016-10-03 ENCOUNTER — Ambulatory Visit (INDEPENDENT_AMBULATORY_CARE_PROVIDER_SITE_OTHER): Payer: 59 | Admitting: Hematology & Oncology

## 2016-10-27 ENCOUNTER — Encounter (INDEPENDENT_AMBULATORY_CARE_PROVIDER_SITE_OTHER): Payer: Self-pay | Admitting: Hematology & Oncology

## 2016-11-04 ENCOUNTER — Encounter (INDEPENDENT_AMBULATORY_CARE_PROVIDER_SITE_OTHER): Payer: Self-pay

## 2016-11-19 ENCOUNTER — Ambulatory Visit (FREE_STANDING_LABORATORY_FACILITY): Payer: 59 | Admitting: Family Medicine

## 2016-11-19 ENCOUNTER — Encounter (INDEPENDENT_AMBULATORY_CARE_PROVIDER_SITE_OTHER): Payer: Self-pay | Admitting: Family Medicine

## 2016-11-19 VITALS — BP 114/78 | HR 90 | Temp 99.6°F | Wt 130.0 lb

## 2016-11-19 DIAGNOSIS — F902 Attention-deficit hyperactivity disorder, combined type: Secondary | ICD-10-CM

## 2016-11-19 DIAGNOSIS — R739 Hyperglycemia, unspecified: Secondary | ICD-10-CM

## 2016-11-19 DIAGNOSIS — D508 Other iron deficiency anemias: Secondary | ICD-10-CM

## 2016-11-19 DIAGNOSIS — H93A2 Pulsatile tinnitus, left ear: Secondary | ICD-10-CM

## 2016-11-19 DIAGNOSIS — M357 Hypermobility syndrome: Secondary | ICD-10-CM

## 2016-11-19 LAB — HEMOGLOBIN A1C
Average Estimated Glucose: 108.3 mg/dL
Hemoglobin A1C: 5.4 % (ref 4.6–5.9)

## 2016-11-19 LAB — COMPREHENSIVE METABOLIC PANEL
ALT: 20 U/L (ref 0–55)
AST (SGOT): 26 U/L (ref 5–34)
Albumin/Globulin Ratio: 1.4 (ref 0.9–2.2)
Albumin: 4.3 g/dL (ref 3.5–5.0)
Alkaline Phosphatase: 68 U/L (ref 37–106)
BUN: 13 mg/dL (ref 7.0–19.0)
Bilirubin, Total: 0.5 mg/dL (ref 0.1–1.2)
CO2: 30 mEq/L — ABNORMAL HIGH (ref 21–29)
Calcium: 10.3 mg/dL (ref 8.5–10.5)
Chloride: 103 mEq/L (ref 100–111)
Creatinine: 0.7 mg/dL (ref 0.4–1.5)
Globulin: 3.1 g/dL (ref 2.0–3.7)
Glucose: 99 mg/dL (ref 70–100)
Potassium: 4.4 mEq/L (ref 3.5–5.1)
Protein, Total: 7.4 g/dL (ref 6.0–8.3)
Sodium: 141 mEq/L (ref 136–145)

## 2016-11-19 LAB — CBC AND DIFFERENTIAL
Absolute NRBC: 0 10*3/uL
Basophils Absolute Automated: 0.03 10*3/uL (ref 0.00–0.20)
Basophils Automated: 0.5 %
Eosinophils Absolute Automated: 0.07 10*3/uL (ref 0.00–0.70)
Eosinophils Automated: 1.2 %
Hematocrit: 41.4 % (ref 37.0–47.0)
Hgb: 12.8 g/dL (ref 12.0–16.0)
Immature Granulocytes Absolute: 0.02 10*3/uL
Immature Granulocytes: 0.3 %
Lymphocytes Absolute Automated: 1.54 10*3/uL (ref 0.50–4.40)
Lymphocytes Automated: 25.5 %
MCH: 29.8 pg (ref 28.0–32.0)
MCHC: 30.9 g/dL — ABNORMAL LOW (ref 32.0–36.0)
MCV: 96.3 fL (ref 80.0–100.0)
MPV: 10.1 fL (ref 9.4–12.3)
Monocytes Absolute Automated: 0.49 10*3/uL (ref 0.00–1.20)
Monocytes: 8.1 %
Neutrophils Absolute: 3.9 10*3/uL (ref 1.80–8.10)
Neutrophils: 64.4 %
Nucleated RBC: 0 /100 WBC (ref 0.0–1.0)
Platelets: 311 10*3/uL (ref 140–400)
RBC: 4.3 10*6/uL (ref 4.20–5.40)
RDW: 12 % (ref 12–15)
WBC: 6.05 10*3/uL (ref 3.50–10.80)

## 2016-11-19 LAB — FERRITIN: Ferritin: 48.9 ng/mL (ref 4.60–204.00)

## 2016-11-19 LAB — IRON PROFILE
Iron Saturation: 17 % (ref 15–50)
Iron: 61 ug/dL (ref 40–145)
TIBC: 361 ug/dL (ref 265–497)
UIBC: 300 ug/dL (ref 126–382)

## 2016-11-19 LAB — HEMOLYSIS INDEX: Hemolysis Index: 5 (ref 0–18)

## 2016-11-19 LAB — GFR: EGFR: >60

## 2016-11-19 LAB — FOLATE: Folate: >20 ng/mL

## 2016-11-19 LAB — VITAMIN B12: Vitamin B-12: 352 pg/mL (ref 211–911)

## 2016-11-19 MED ORDER — METHYLPHENIDATE HCL 5 MG PO TABS
ORAL_TABLET | ORAL | 0 refills | Status: DC
Start: 2016-11-19 — End: 2017-03-12

## 2016-11-19 MED ORDER — METHYLPHENIDATE HCL 5 MG PO TABS
5.0000 mg | ORAL_TABLET | Freq: Four times a day (QID) | ORAL | 0 refills | Status: DC
Start: 2016-11-19 — End: 2017-03-12

## 2016-11-19 NOTE — Progress Notes (Signed)
Subjective:      Patient ID: Teresa [name]  is a 46 y.o.  female.     Chief Complaint   Patient presents with   . Tinnitus   . Medication Refill        HPI    Patient comes in today for evaluation of pulsatile tinnitus.  She has been hearing a whooshing sound in her left ear for last 6 days.  She hears it more at nighttime but she hears it consistently throughout the day.  She says that for the last several months she has noticed a numb sensation on the left side of her face.  She has also noticed some hearing loss on the left side.  Neither sensation has been progressive.  No fevers, chills, cough, vision change.  She is seeing her neurologist and had an MRI which did not show a reason for this.  She has a history of hypermobility syndrome but has not completed the workup for Ehlers-Danlos.    She is also following up for attention deficit/hyperactivity disorder.  She has been taking Ritalin 5 mg 4 times a day.  It does not cause an increase in her pulsatile tinnitus.  Her blood pressures never been a problem.  She does not have palpitations.  The Ritalin is helping.       Past Medical History:   Diagnosis Date   . ADHD (attention deficit hyperactivity disorder)    . Anemia     constant   . Anxiety    . Arrhythmia     pt states she belives anemia led to heart flutters in the past.   . Asthma    . Depression    . Dysphagia    . Eczema    . Gastroesophageal reflux disease    . Headache     migraine, sensitivity to light   . Heart burn    . Hypercholesterolemia 12/24/2015   . Malignant neoplasm of colon 2009    chemo   . Malignant neoplasm of skin 2007    basal   . Osteoporosis, idiopathic     Not sure.  Have had osteopenia in past but not now.   . Post-operative nausea and vomiting    . Psoriasis    . Seasonal allergic rhinitis    . Sleep apnea     does not use CPAP       Review of Systems    see above.  No nausea vomiting or diarrhea      Objective:     BP 114/78 (BP Site: Left arm, Patient Position:  Sitting, Cuff Size: Medium)   Pulse 90   Temp 99.6 F (37.6 C) (Oral)   Wt 59 kg (130 lb)   SpO2 99%   BMI 23.78 kg/m     Physical Exam   Constitutional: She is oriented to person, place, and time.   HENT:   Mouth/Throat: Oropharynx is clear and moist.   Eyes: Conjunctivae are normal.   Cardiovascular: Normal rate.    Neurological: She is alert and oriented to person, place, and time. No cranial nerve deficit. Coordination normal.   Skin: Capillary refill takes less than 2 seconds.   Psychiatric: She has a normal mood and affect. Her behavior is normal.     Vital signs reviewed     Assessment and Plan:     1. Pulsatile tinnitus of left ear  This is a new problem.  Since it has been consistent since it started,  will refer to ENT for further evaluation.  She may need a MRI with contrast.  - Ambulatory referral to ENT  - Comprehensive metabolic panel  - CBC and differential    2. Attention deficit hyperactivity disorder (ADHD), combined type  Stable.  Refill given.  - methylphenidate (RITALIN) 5 MG tablet; Take QID.  Fill 60 days from date on rx  Dispense: 120 tablet; Refill: 0  - methylphenidate (RITALIN) 5 MG tablet; Take 1 tablet (5 mg total) by mouth 4 (four) times daily.  Dispense: 120 tablet; Refill: 0  - methylphenidate (RITALIN) 5 MG tablet; Take QID.  Fill 30 days from date on rx  Dispense: 120 tablet; Refill: 0    3. Hyperglycemia  4. Iron deficiency anemia secondary to inadequate dietary iron intake  She has changed her diet so she requested her labs rechecked.  - Comprehensive metabolic panel  - Folate  - Vitamin B12  - IRON PROFILE  - Ferritin  - CBC and differential    5. Hypermobility syndrome      Discussed red flags to return for reevaluation.  Risk & Benefits of any previous or new medication(s) were explained to the patient who verbalized understanding & agreed to the treatment plan.     Call with updates/questions/concerns.    Ellery Plunk, M.D.

## 2016-11-19 NOTE — Progress Notes (Signed)
Have you seen any specialists/other providers since your last visit with [place]?    Yes , GI, oncology, dermatology      Limb alert protocol reviewed?      Yes

## 2016-11-24 ENCOUNTER — Other Ambulatory Visit: Payer: Self-pay | Admitting: Otolaryngology

## 2016-11-24 DIAGNOSIS — H93A9 Pulsatile tinnitus, unspecified ear: Secondary | ICD-10-CM

## 2016-11-25 ENCOUNTER — Ambulatory Visit: Payer: 59 | Attending: Otolaryngology

## 2016-11-25 DIAGNOSIS — H93A2 Pulsatile tinnitus, left ear: Secondary | ICD-10-CM | POA: Insufficient documentation

## 2016-11-25 DIAGNOSIS — H93A9 Pulsatile tinnitus, unspecified ear: Secondary | ICD-10-CM

## 2016-11-25 DIAGNOSIS — R9082 White matter disease, unspecified: Secondary | ICD-10-CM | POA: Insufficient documentation

## 2016-11-25 MED ORDER — GADOBUTROL 1 MMOL/ML IV SOLN
6.0000 mL | Freq: Once | INTRAVENOUS | Status: AC | PRN
Start: 2016-11-25 — End: 2016-11-25
  Administered 2016-11-25: 6 mmol via INTRAVENOUS
  Filled 2016-11-25: qty 7.5

## 2016-12-04 ENCOUNTER — Other Ambulatory Visit (INDEPENDENT_AMBULATORY_CARE_PROVIDER_SITE_OTHER): Payer: Self-pay | Admitting: Family Medicine

## 2016-12-04 DIAGNOSIS — N898 Other specified noninflammatory disorders of vagina: Secondary | ICD-10-CM

## 2016-12-05 ENCOUNTER — Ambulatory Visit (INDEPENDENT_AMBULATORY_CARE_PROVIDER_SITE_OTHER): Payer: 59 | Admitting: Family Medicine

## 2016-12-08 ENCOUNTER — Encounter (INDEPENDENT_AMBULATORY_CARE_PROVIDER_SITE_OTHER): Payer: Self-pay

## 2016-12-12 DIAGNOSIS — H93A2 Pulsatile tinnitus, left ear: Secondary | ICD-10-CM | POA: Insufficient documentation

## 2016-12-16 ENCOUNTER — Telehealth (INDEPENDENT_AMBULATORY_CARE_PROVIDER_SITE_OTHER): Payer: Self-pay

## 2016-12-16 NOTE — Telephone Encounter (Signed)
Emailed patient re: BI for EDS genetic testing       Hi Mollye,     Here is BI information from [facility]. Please reach out to them to lower cost or let me know if you want to order testing    Good Afternoon Lurena Joiner,    Based on the insurance information provided, the out-of-pocket cost for patient A. Swanton DOB September 27, 1970 for test J555 is estimated to be $198.00.    This amount can be significantly decreased, if you are able to provide [place] with the number of people in the patient's household and the estimated household annual income. Please reach out to [place] so that we can provide you with the lowest OOP cost for testing. You can e-mail [place] at [facility] .[email or web address] or you may call us at [phone] with case [phone].    BI Valid Until: 03/11/2017    [facility]  Billing Customer Service  [facility]  [address] [place], [place] [address]  Office:  [phone]:  [phone]      Best,   Rella Larve MS, [facility]  Licensed and Certified Genetic Counselor   Co-Founder of [facility]  [name]  [address]  [place] [address]  T [phone] [email or web address] F [phone]     [email or web address]

## 2017-01-07 ENCOUNTER — Ambulatory Visit (INDEPENDENT_AMBULATORY_CARE_PROVIDER_SITE_OTHER): Payer: 59 | Admitting: Clinical Genetics (M.D.)

## 2017-01-07 DIAGNOSIS — M357 Hypermobility syndrome: Secondary | ICD-10-CM

## 2017-01-07 NOTE — Progress Notes (Signed)
Pt is here for sample collection for genetic testing which will be sent to [facility].

## 2017-01-12 DIAGNOSIS — Z1379 Encounter for other screening for genetic and chromosomal anomalies: Secondary | ICD-10-CM

## 2017-01-13 ENCOUNTER — Encounter (INDEPENDENT_AMBULATORY_CARE_PROVIDER_SITE_OTHER): Payer: Self-pay | Admitting: Family Medicine

## 2017-01-15 ENCOUNTER — Ambulatory Visit: Payer: Self-pay

## 2017-02-10 ENCOUNTER — Telehealth: Payer: Self-pay

## 2017-02-10 NOTE — Telephone Encounter (Signed)
I spoke with the patient by phone regarding the patient's HDCT results, which are negative. We discussed that this test result does not rule out all genetic changes or conditions. Patient had no questions or concerns.     We recommended a follow up appointment with genetics at the [facility] if any additional questions or concerns arise.  We will send a letter of genetic testing results to the patient.   Please contact genetics with concerns.    Signed: Rella Larve, MS, [facility]  Certified Genetic Counselor  [facility]  Division of Medical [facility]  [phone]

## 2017-02-16 NOTE — Progress Notes (Signed)
Date: 02/16/2017  Re: genetic testing    Dear Teresa [name],     It was a pleasure to meet you at [facility]. We have discussed the results of your genetic testing. This letter will summarize the information from our conversation for your records.    As you know, you were referred for a genetics evaluation for continual joint hypermobility concerns. We ordered an Ehlers-Danlos Syndrome (EDS) connective tissue panel to rule out that you have any known genetic cause of EDS. We will go over the test and results below. It is important to first review a few basic principles of genetics to understand the type of testing that was performed.    In every cell of the human body, we have 23 chromosomes that come in pairs, totaling 46 chromosomes. We get one copy of every chromosome from our father and the other copy of every chromosome from our mother. Each chromosome is made up of DNA, which is arranged into genes. Genes are like instruction books for how our bodies develop and function. Genes are spelled out, like sentences, in the letters of the genetic code. Since we have two copies of every chromosome, we also have two copies of our genes. The number of copies of a gene and the spelling of our genes are important for our health. Sometimes, if a gene has a spelling change, the gene does not work normally and cannot provide an important function for our body. This may cause a person to have a medical condition.     The test we ordered is the "Ehlers-Danlos Syndrome Panel," which looked at 49 genes known to be associated with the different types of EDS. This testing looked for any "spelling errors" in all 49 genes as well as whether there were missing or extra copies of these genes. Changes or mutations in connective tissue cause the signs and symptoms of these disorders, which can vary from mildly loose joints to life-threatening vascular complications. The different types of EDS are distinguished by their  respective signs and symptoms, underlying genetic causes, and patterns of inheritance. While mutations in more than a dozen genes have been found to cause various forms EDS there are still many genetic changes that have yet to be identified. A genetic cause is identified in less than half of all patients with a clinically diagnosed form of EDS.      Your genetic testing result indicates that you have no pathogenic variant detected at this time.    While you have features of a connective tissue disorder there is no genetic confirmation of a particular diagnosis.  At this time, additional genetic testing is not recommended.     Thank you for allowing [place] to be a part of your care. We wish the best for you and your family, and we hope that you continue to do well. If you have any additional questions or concerns please contact us any time at [phone].    Sincerely,   Electronically signed by:    Rella Larve [facility]  Genetic Counselor    Sheppard Plumber, MD  Medical Geneticist

## 2017-03-07 ENCOUNTER — Other Ambulatory Visit (INDEPENDENT_AMBULATORY_CARE_PROVIDER_SITE_OTHER): Payer: Self-pay | Admitting: Family Medicine

## 2017-03-07 DIAGNOSIS — F5101 Primary insomnia: Secondary | ICD-10-CM

## 2017-03-07 DIAGNOSIS — M357 Hypermobility syndrome: Secondary | ICD-10-CM

## 2017-03-07 DIAGNOSIS — Z8669 Personal history of other diseases of the nervous system and sense organs: Secondary | ICD-10-CM

## 2017-03-07 DIAGNOSIS — M797 Fibromyalgia: Secondary | ICD-10-CM

## 2017-03-12 ENCOUNTER — Ambulatory Visit (FREE_STANDING_LABORATORY_FACILITY): Payer: 59 | Admitting: Family Medicine

## 2017-03-12 ENCOUNTER — Encounter (INDEPENDENT_AMBULATORY_CARE_PROVIDER_SITE_OTHER): Payer: Self-pay | Admitting: Family Medicine

## 2017-03-12 VITALS — BP 104/72 | HR 88 | Temp 99.4°F | Ht 61.5 in | Wt 129.0 lb

## 2017-03-12 DIAGNOSIS — R739 Hyperglycemia, unspecified: Secondary | ICD-10-CM

## 2017-03-12 DIAGNOSIS — M797 Fibromyalgia: Secondary | ICD-10-CM

## 2017-03-12 DIAGNOSIS — E7800 Pure hypercholesterolemia, unspecified: Secondary | ICD-10-CM

## 2017-03-12 DIAGNOSIS — D508 Other iron deficiency anemias: Secondary | ICD-10-CM

## 2017-03-12 DIAGNOSIS — R1012 Left upper quadrant pain: Secondary | ICD-10-CM

## 2017-03-12 DIAGNOSIS — Z0000 Encounter for general adult medical examination without abnormal findings: Secondary | ICD-10-CM

## 2017-03-12 DIAGNOSIS — E559 Vitamin D deficiency, unspecified: Secondary | ICD-10-CM

## 2017-03-12 DIAGNOSIS — F902 Attention-deficit hyperactivity disorder, combined type: Secondary | ICD-10-CM

## 2017-03-12 LAB — TSH: TSH: 0.88 u[IU]/mL (ref 0.35–4.94)

## 2017-03-12 LAB — GFR: EGFR: >60

## 2017-03-12 LAB — URINALYSIS WITH MICROSCOPIC
Bilirubin, UA: NEGATIVE
Blood, UA: NEGATIVE
Glucose, UA: NEGATIVE
Ketones UA: NEGATIVE
Nitrite, UA: NEGATIVE
Protein, UR: NEGATIVE
Specific Gravity UA: 1.007 (ref 1.001–1.035)
Urine pH: 7 (ref 5.0–8.0)
Urobilinogen, UA: 0.2

## 2017-03-12 LAB — HEMOGLOBIN A1C
Average Estimated Glucose: 102.5 mg/dL
Hemoglobin A1C: 5.2 % (ref 4.6–5.9)

## 2017-03-12 LAB — LIPID PANEL
Cholesterol / HDL Ratio: 2.6
Cholesterol: 222 mg/dL — ABNORMAL HIGH (ref 0–199)
HDL: 85 mg/dL (ref 40–9999)
LDL Calculated: 126 mg/dL — ABNORMAL HIGH (ref 0–99)
Triglycerides: 53 mg/dL (ref 34–149)
VLDL Calculated: 11 mg/dL (ref 10–40)

## 2017-03-12 LAB — VITAMIN D,25 OH,TOTAL: Vitamin D, 25 OH, Total: 51 ng/mL (ref 30–100)

## 2017-03-12 LAB — CBC AND DIFFERENTIAL
Absolute NRBC: 0 10*3/uL
Basophils Absolute Automated: 0.05 10*3/uL (ref 0.00–0.20)
Basophils Automated: 0.8 %
Eosinophils Absolute Automated: 0.07 10*3/uL (ref 0.00–0.70)
Eosinophils Automated: 1.1 %
Hematocrit: 42.6 % (ref 37.0–47.0)
Hgb: 13 g/dL (ref 12.0–16.0)
Immature Granulocytes Absolute: 0.02 10*3/uL
Immature Granulocytes: 0.3 %
Lymphocytes Absolute Automated: 1.6 10*3/uL (ref 0.50–4.40)
Lymphocytes Automated: 25.3 %
MCH: 29.9 pg (ref 28.0–32.0)
MCHC: 30.5 g/dL — ABNORMAL LOW (ref 32.0–36.0)
MCV: 97.9 fL (ref 80.0–100.0)
MPV: 10.6 fL (ref 9.4–12.3)
Monocytes Absolute Automated: 0.51 10*3/uL (ref 0.00–1.20)
Monocytes: 8.1 %
Neutrophils Absolute: 4.07 10*3/uL (ref 1.80–8.10)
Neutrophils: 64.4 %
Nucleated RBC: 0 /100 WBC (ref 0.0–1.0)
Platelets: 306 10*3/uL (ref 140–400)
RBC: 4.35 10*6/uL (ref 4.20–5.40)
RDW: 12 % (ref 12–15)
WBC: 6.32 10*3/uL (ref 3.50–10.80)

## 2017-03-12 LAB — COMPREHENSIVE METABOLIC PANEL
ALT: 16 U/L (ref 0–55)
AST (SGOT): 23 U/L (ref 5–34)
Albumin/Globulin Ratio: 1.3 (ref 0.9–2.2)
Albumin: 4.2 g/dL (ref 3.5–5.0)
Alkaline Phosphatase: 84 U/L (ref 37–106)
BUN: 13 mg/dL (ref 7.0–19.0)
Bilirubin, Total: 0.7 mg/dL (ref 0.1–1.2)
CO2: 31 mEq/L — ABNORMAL HIGH (ref 21–29)
Calcium: 9.8 mg/dL (ref 8.5–10.5)
Chloride: 104 mEq/L (ref 100–111)
Creatinine: 0.7 mg/dL (ref 0.4–1.5)
Globulin: 3.3 g/dL (ref 2.0–3.7)
Glucose: 89 mg/dL (ref 70–100)
Potassium: 4.1 mEq/L (ref 3.5–5.1)
Protein, Total: 7.5 g/dL (ref 6.0–8.3)
Sodium: 142 mEq/L (ref 136–145)

## 2017-03-12 LAB — HEMOLYSIS INDEX: Hemolysis Index: 7 (ref 0–18)

## 2017-03-12 LAB — IRON PROFILE
Iron Saturation: 20 % (ref 15–50)
Iron: 71 ug/dL (ref 40–145)
TIBC: 353 ug/dL (ref 265–497)
UIBC: 282 ug/dL (ref 126–382)

## 2017-03-12 LAB — FERRITIN: Ferritin: 40.88 ng/mL (ref 4.60–204.00)

## 2017-03-12 LAB — VITAMIN B12: Vitamin B-12: 619 pg/mL (ref 211–911)

## 2017-03-12 LAB — FOLATE: Folate: 14.8 ng/mL

## 2017-03-12 MED ORDER — METHYLPHENIDATE HCL 5 MG PO TABS
ORAL_TABLET | ORAL | 0 refills | Status: DC
Start: 2017-03-12 — End: 2017-06-18

## 2017-03-12 MED ORDER — METHYLPHENIDATE HCL 5 MG PO TABS
5.0000 mg | ORAL_TABLET | Freq: Four times a day (QID) | ORAL | 0 refills | Status: DC
Start: 2017-03-12 — End: 2017-06-18

## 2017-03-12 NOTE — Progress Notes (Signed)
Have you seen any specialists/other providers since your last visit with us?    Yes, dermatologist    Arm preference verified?   Yes    The patient is due for pap smear

## 2017-03-12 NOTE — Progress Notes (Signed)
Subjective:      Patient ID: Teresa [name]  is a 46 y.o.  female.     Chief Complaint   Patient presents with   . Annual Exam     Patient is asting.         Here for complete physical examination with fasting blood work.  Problems noted include:  Left upper quadrant pain-present for the last 2 months.  Sensitive to touch.  No change in bowel movements.  She usually alternates between constipation and diarrhea.  She has gained some weight over the last year.  She has gained about 8-10 pounds.  This is in spite of eating less and exercising more.  She has some night sweats but thinks it may be related to menopause.    She had her colonoscopy done this year which was normal.  She has been following up with her oncologist every year.  She has an appointment with GYN in the next few months.    She is worried about her ferritin/iron level.  She had been taking an iron supplement daily.  We did a trial off of it for approximately 60 days.  She will be checking it again today.  We checked a Hemoccult which was negative.    She had some genetic testing done for Ehlers-Danlos syndrome.  She would like to have a full genetic panel done and she will pay for that out of pocket.  Review of Systems     Constitutional: Positive for fatigue.   Respiratory: Negative for cough and shortness of breath.    Cardiovascular: Negative for chest pain, palpitations and leg swelling.   Neurological: Negative for dizziness and headaches.   All other systems reviewed and are negative    Past Medical History:   Diagnosis Date   . ADHD (attention deficit hyperactivity disorder)    . Anemia     constant   . Anxiety    . Arrhythmia     pt states she belives anemia led to heart flutters in the past.   . Asthma    . Depression    . Dysphagia    . Eczema    . Gastroesophageal reflux disease    . Headache     migraine, sensitivity to light   . Heart burn    . Hypercholesterolemia 12/24/2015   . Malignant neoplasm of colon 2009    chemo   .  Malignant neoplasm of skin 2007    basal   . Osteoporosis, idiopathic     Not sure.  Have had osteopenia in past but not now.   . Post-operative nausea and vomiting    . Psoriasis    . Seasonal allergic rhinitis    . Sleep apnea     does not use CPAP     Past Surgical History:   Procedure Laterality Date   . APPENDECTOMY     . COLECTOMY  2009    partial   . COLONOSCOPY  2014   . COLONOSCOPY N/A 11/13/2014    Procedure: COLONOSCOPY;  Surgeon: Huntley Estelle, MD;  Location: [facility];  Service: Gastroenterology;  Laterality: N/A;   . EGD  2014   . EGD, DILATION N/A 11/13/2014    Procedure: EGD, DILATION;  Surgeon: Huntley Estelle, MD;  Location: [id number];  Service: Gastroenterology;  Laterality: N/A;  EGD, DILATION/COLONOSCOPY W/IVA   . REDUCTION MAMMAPLASTY  2006   . uterine ablation   2011     Family History  Problem Relation Age of Onset   . Colon cancer Father    . Cancer Father      colon   . Early death Father      Age 74, colon cancer   . Depression Mother    . Diabetes Mother      Pre-diabetes; not diabetes   . Heart disease Mother    . Hyperlipidemia Mother    . Vision loss Mother      cataracts and glaucoma   . Obesity Maternal Aunt      Social History     Social History   . Marital status: Married     Spouse name: N/A   . Number of children: N/A   . Years of education: N/A     Occupational History   . Not on file.     Social History Main Topics   . Smoking status: Never Smoker   . Smokeless tobacco: Never Used   . Alcohol use No   . Drug use: No   . Sexual activity: Not Currently     Partners: Male     Birth control/ protection: Abstinence, Surgical     Other Topics Concern   . Not on file     Social History Narrative   . No narrative on file     Current Outpatient Prescriptions   Medication Sig Dispense Refill   . acetaminophen (TYLENOL) 500 MG tablet      . amitriptyline (ELAVIL) 10 MG tablet TAKE 1 TABLET (10 MG TOTAL) BY MOUTH NIGHTLY. 30 tablet 11   . Ascorbic Acid (VITAMIN C) 250 MG tablet  Take 250 mg by mouth every other day.     Marland Kitchen BOTOX 200 units Recon Soln 100 Units.         Marland Kitchen buPROPion (WELLBUTRIN) 75 MG tablet Take 1/4 pill q day 24 tablet 4   . Calcium Citrate 200 MG Tab Take 600 mg by mouth daily.        Marland Kitchen CINNAMON PO Take by mouth.     . Digestive Enzymes (DIGESTIVE ENZYME PO) Take 1 tablet by mouth daily.        . diphenhydrAMINE (BENADRYL) 50 MG tablet Take 50 mg by mouth nightly as needed for Itching.     Marland Kitchen L-Lysine 1000 MG Tab Take 1,300 mg by mouth daily.        Marland Kitchen L-Theanine 100 MG Cap Take 200 mg by mouth 2 (two) times daily.         Marland Kitchen MAGNESIUM CHLORIDE PO Take 150 mg by mouth nightly.     . methylphenidate (RITALIN) 5 MG tablet Take QID.  Fill 60 days from date on rx 120 tablet 0   . montelukast (SINGULAIR) 4 MG Packet Take 2 packets (8 mg total) by mouth nightly. 180 packet 4   . Multiple Vitamin (MULTIVITAMIN) tablet Take 1 tablet by mouth daily.     . Omega-3 Fatty Acids (OMEGA-3 FISH OIL) 500 MG Cap Take 640 mg by mouth daily.         Marland Kitchen PREMARIN vaginal cream PLACE VAGINALLY DAILY AS NEEDED. 30 g 2   . PREVIDENT 5000 BOOSTER PLUS 1.1 % Paste APPLY TO TEETH AFTER REGULAR BRUSHING AND FLOSSING  0   . Probiotic Product (SOLUBLE FIBER/PROBIOTICS PO) Take 1 tablet by mouth daily.        . RESTASIS 0.05 % ophthalmic emulsion APPLY ONE DROP IN EACH EYE 2 TIMES A DAY  6   .  Turmeric Curcumin 500 MG Cap Take by mouth.     . Ubiquinol 200 MG Cap Take 200 mg by mouth every morning.     Marland Kitchen albuterol (PROVENTIL HFA;VENTOLIN HFA) 108 (90 Base) MCG/ACT inhaler Inhale 2 puffs into the lungs every 4 (four) hours as needed for Wheezing. 1 Inhaler 0   . betamethasone, augmented, (DIPROLENE) 0.05 % cream APPLY TWICE DAILY TO THE AFFECTED AREAS FOR 5-7 DAYS FOR ECZEMA.  2   . Biotin 1000 MCG tablet 5,000 mcg.         . ferrous sulfate 325 (65 FE) MG tablet Take 325 mg by mouth every other day.     . methylphenidate (RITALIN) 5 MG tablet Take 1 tablet (5 mg total) by mouth 4 (four) times daily. 120  tablet 0   . methylphenidate (RITALIN) 5 MG tablet Take QID.  Fill 30 days from date on rx 120 tablet 0     No current facility-administered medications for this visit.      Allergies   Allergen Reactions   . Codeine Other (See Comments)     SOB/Supress Breathing   . Morphine Shortness Of Breath   . Advair Diskus [Fluticasone-Salmeterol]      confusion   . Epinephrine      Ok for emergencies, causes tachycardia   . Dairy [Milk-Related Compounds] Diarrhea   . Lidocaine Nausea Only and Tinitus   . Penicillins      Rxn unknown. Pt states allergy test done-allergic to penicillin Mold.    . Wheat Extract Diarrhea             Objective:   BP 104/72 (BP Site: Left arm, Patient Position: Sitting, Cuff Size: Medium)   Pulse 88   Temp 99.4 F (37.4 C) (Oral)   Ht 1.562 m (5' 1.5")   Wt 58.5 kg (129 lb)   SpO2 98%   BMI 23.98 kg/m   Physical Exam   Constitutional: She is oriented to person, place, and time. She appears well-developed and well-nourished.   HENT:   Head: Normocephalic and atraumatic.   Right Ear: External ear normal.   Left Ear: External ear normal.   Nose: Nose normal.   Mouth/Throat: Oropharynx is clear and moist.   Eyes: Conjunctivae and EOM are normal.   Neck: Normal range of motion. Neck supple. No thyromegaly present.   Cardiovascular: Normal rate, regular rhythm and normal heart sounds.    No murmur heard.  Pulmonary/Chest: Effort normal and breath sounds normal. She has no wheezes. She has no rales.   Abdominal: Soft. Bowel sounds are normal. She exhibits no distension. There is tenderness (mild tenderness left upper quadrant).   Musculoskeletal: Normal range of motion. She exhibits no edema.   Lymphadenopathy:     She has no cervical adenopathy.   Neurological: She is alert and oriented to person, place, and time. Coordination normal.   CN 2-12 intact  Normal strength and ROM in all four extremities   Skin: She is not diaphoretic.   No visible rash   Psychiatric: She has a normal mood and  affect. Her behavior is normal. Thought content normal.   Vitals reviewed.         Assessment and Plan:   1. Routine general medical examination at a health care facility  - CBC and differential  - Ferritin  - IRON PROFILE  - Vitamin B12  - Folate  - TSH  - Comprehensive metabolic panel  - Vitamin D,25 OH, Total  -  Lipid panel  - Hemoglobin A1C  - Pap Smear, Thin Prep Method w/ HR HPV    2. Vitamin D deficiency disease  - Vitamin D,25 OH, Total    3. Iron deficiency anemia secondary to inadequate dietary iron intake  - CBC and differential  - Ferritin  - IRON PROFILE  - Vitamin B12  - Folate    4. Hyperglycemia  - Hemoglobin A1C    5. Hypercholesterolemia    6. Fibromyalgia      DDS up to date; Ophtho up to date.  Colonoscopy-Done in April 2018  Pap smear-With GYN  Mammo-ordered today  Cholesterol-today  TDAP-2017  Flu-fall  Shingles-50      Body mass index is 23.98 kg/m.   Discussed the patient's BMI with her.    The BMI is in the acceptable range.    Nutrition Counseling: Low cholesterol diet education  Physical Activity Counseling: Patient advised about exercise     Counseling/Anticipatory Guidance (40-64): Nutrition, physical activity, healthy weight, injury prevention, misuse of tobacco, alcohol and drugs, sexual behavior and STDs, contraception, dental health, mental health, immunizations, screenings.      Call with updates/questions/concerns.    Ellery Plunk MD

## 2017-03-15 ENCOUNTER — Encounter (INDEPENDENT_AMBULATORY_CARE_PROVIDER_SITE_OTHER): Payer: Self-pay | Admitting: Family Medicine

## 2017-03-17 ENCOUNTER — Encounter (INDEPENDENT_AMBULATORY_CARE_PROVIDER_SITE_OTHER): Payer: Self-pay | Admitting: Family Medicine

## 2017-03-19 ENCOUNTER — Telehealth (INDEPENDENT_AMBULATORY_CARE_PROVIDER_SITE_OTHER): Payer: Self-pay | Admitting: Hematology & Oncology

## 2017-03-19 NOTE — Telephone Encounter (Signed)
Pt recv'd communication from her pcp dr spiegel that dr cannon wanted to see her in sept 2018 - she stated that she is on an annual basis and want to know if  She need a sept f/u?   pls call back on  [phone]

## 2017-03-20 NOTE — Telephone Encounter (Signed)
Discussed with Dr Lady Gary who stated annual basis is okay.  Called and discussed with patient. Scheduled her to see Dr Lady Gary in January

## 2017-03-27 ENCOUNTER — Encounter (INDEPENDENT_AMBULATORY_CARE_PROVIDER_SITE_OTHER): Payer: 59 | Admitting: Family Medicine

## 2017-04-03 ENCOUNTER — Encounter (INDEPENDENT_AMBULATORY_CARE_PROVIDER_SITE_OTHER): Payer: 59 | Admitting: Family Medicine

## 2017-04-24 ENCOUNTER — Ambulatory Visit: Payer: 59

## 2017-05-02 ENCOUNTER — Ambulatory Visit
Admission: RE | Admit: 2017-05-02 | Discharge: 2017-05-02 | Disposition: A | Discharge status: Home / Self Care | Payer: 59 | Source: Ambulatory Visit | Attending: Family Medicine | Admitting: Family Medicine

## 2017-05-02 DIAGNOSIS — K7689 Other specified diseases of liver: Secondary | ICD-10-CM | POA: Insufficient documentation

## 2017-05-02 DIAGNOSIS — R1012 Left upper quadrant pain: Secondary | ICD-10-CM | POA: Insufficient documentation

## 2017-05-08 ENCOUNTER — Ambulatory Visit: Payer: 59

## 2017-05-16 ENCOUNTER — Encounter (INDEPENDENT_AMBULATORY_CARE_PROVIDER_SITE_OTHER): Payer: Self-pay | Admitting: Family Medicine

## 2017-05-16 DIAGNOSIS — R109 Unspecified abdominal pain: Secondary | ICD-10-CM

## 2017-05-24 NOTE — Telephone Encounter (Signed)
Can you run this referral through [employer] for her and let her know? Thanks!

## 2017-05-26 NOTE — Telephone Encounter (Signed)
Pt did not need insurance referral. Thank you!

## 2017-06-12 ENCOUNTER — Ambulatory Visit: Payer: 59 | Admitting: Obstetrics & Gynecology

## 2017-06-12 ENCOUNTER — Encounter: Payer: Self-pay | Admitting: Obstetrics & Gynecology

## 2017-06-12 ENCOUNTER — Ambulatory Visit (INDEPENDENT_AMBULATORY_CARE_PROVIDER_SITE_OTHER): Payer: 59 | Admitting: Family Medicine

## 2017-06-12 VITALS — BP 112/68 | Ht 62.0 in | Wt 130.0 lb

## 2017-06-12 DIAGNOSIS — N898 Other specified noninflammatory disorders of vagina: Secondary | ICD-10-CM

## 2017-06-12 DIAGNOSIS — Z01419 Encounter for gynecological examination (general) (routine) without abnormal findings: Secondary | ICD-10-CM

## 2017-06-12 DIAGNOSIS — Z1231 Encounter for screening mammogram for malignant neoplasm of breast: Secondary | ICD-10-CM

## 2017-06-12 DIAGNOSIS — Z1151 Encounter for screening for human papillomavirus (HPV): Secondary | ICD-10-CM

## 2017-06-12 LAB — POCT UA URISTIX
Glucose, UA POCT: NEGATIVE mg/dL
POCT Protein, UA: NEGATIVE mg/dL

## 2017-06-12 MED ORDER — ESTRADIOL 0.1 MG/GM VA CREA
1.0000 g | TOPICAL_CREAM | VAGINAL | 4 refills | Status: AC
Start: 2017-06-12 — End: ?

## 2017-06-12 MED ORDER — ESTROGENS, CONJUGATED 0.625 MG/GM VA CREA
TOPICAL_CREAM | Freq: Every day | VAGINAL | 2 refills | Status: DC | PRN
Start: 2017-06-12 — End: 2017-06-18

## 2017-06-12 NOTE — Progress Notes (Signed)
Subjective:       Teresa [name] is a 46 y.o. female here for a routine exam.  She is a new patient to our office. Last had pap smear over 3 years ago. H/O endometrial ablation 2011. No menses since. Has been using premarin cream for vaginal dryness but desires cheaper option. Pharmacist told her there is an estradiol cream that is generic. History of colon cancer 2009.  Personal health questionnaire reviewed: yes.     Gynecologic History  No LMP recorded. Patient has had an ablation. Menopausal  Contraception: post menopausal status  Breast self exam: discussed         The following portions of the patient's history were reviewed and updated as appropriate: allergies, current medications, past family history, past medical history, past social history, past surgical history and problem list.      Review of Systems  Pertinent items are noted in HPI.      Objective:      BP 112/68   Ht 1.575 m (5\' 2" )   Wt 59 kg (130 lb)   BMI 23.78 kg/m   General appearance: alert, appears stated age and cooperative  Neck: no adenopathy, supple, symmetrical, trachea midline and thyroid not enlarged, symmetric, no tenderness/mass/nodules  Breasts: normal appearance, no masses or tenderness, No nipple retraction or dimpling, No nipple discharge or bleeding, No axillary or supraclavicular adenopathy  Abdomen: normal findings: no masses palpable, no organomegaly and soft, non-tender  Pelvic exam:     Urinary system: urethral meatus normal   External genitalia: normal general appearance   Vaginal: atrophic rugae   Cervix: normal appearance   Adnexa: normal bimanual exam   Uterus: normal single, nontender   Rectal: deferred          Assessment:      Healthy female exam.        Plan:      Mammogram ordered.  Follow up in: 1 year.  Thin prep pap/HPV Yes    Rx sent to pharmacy for estradiol

## 2017-06-16 ENCOUNTER — Encounter: Payer: Self-pay | Admitting: Obstetrics & Gynecology

## 2017-06-17 ENCOUNTER — Other Ambulatory Visit (INDEPENDENT_AMBULATORY_CARE_PROVIDER_SITE_OTHER): Payer: Self-pay

## 2017-06-17 DIAGNOSIS — Z8669 Personal history of other diseases of the nervous system and sense organs: Secondary | ICD-10-CM

## 2017-06-17 DIAGNOSIS — M797 Fibromyalgia: Secondary | ICD-10-CM

## 2017-06-17 DIAGNOSIS — F5101 Primary insomnia: Secondary | ICD-10-CM

## 2017-06-17 DIAGNOSIS — M357 Hypermobility syndrome: Secondary | ICD-10-CM

## 2017-06-17 MED ORDER — AMITRIPTYLINE HCL 10 MG PO TABS
10.0000 mg | ORAL_TABLET | Freq: Every evening | ORAL | 9 refills | Status: DC
Start: 2017-06-17 — End: 2017-07-21

## 2017-06-17 NOTE — Telephone Encounter (Signed)
Received fax from [facility] requesting refill on Amitriptyline HCL 10 mg tab.

## 2017-06-18 ENCOUNTER — Ambulatory Visit (FREE_STANDING_LABORATORY_FACILITY): Payer: 59 | Admitting: Family Medicine

## 2017-06-18 ENCOUNTER — Encounter (INDEPENDENT_AMBULATORY_CARE_PROVIDER_SITE_OTHER): Payer: Self-pay | Admitting: Family Medicine

## 2017-06-18 VITALS — BP 126/76 | HR 82 | Temp 98.8°F | Ht 61.5 in | Wt 132.0 lb

## 2017-06-18 DIAGNOSIS — F902 Attention-deficit hyperactivity disorder, combined type: Secondary | ICD-10-CM

## 2017-06-18 DIAGNOSIS — H9312 Tinnitus, left ear: Secondary | ICD-10-CM

## 2017-06-18 DIAGNOSIS — M357 Hypermobility syndrome: Secondary | ICD-10-CM

## 2017-06-18 DIAGNOSIS — Z1331 Encounter for screening for depression: Secondary | ICD-10-CM

## 2017-06-18 DIAGNOSIS — D509 Iron deficiency anemia, unspecified: Secondary | ICD-10-CM

## 2017-06-18 DIAGNOSIS — M489 Spondylopathy, unspecified: Secondary | ICD-10-CM

## 2017-06-18 DIAGNOSIS — Z23 Encounter for immunization: Secondary | ICD-10-CM

## 2017-06-18 LAB — HEMOLYSIS INDEX: Hemolysis Index: 16 (ref 0–18)

## 2017-06-18 LAB — CBC AND DIFFERENTIAL
Absolute NRBC: 0 10*3/uL
Basophils Absolute Automated: 0.06 10*3/uL (ref 0.00–0.20)
Basophils Automated: 1 %
Eosinophils Absolute Automated: 0.08 10*3/uL (ref 0.00–0.70)
Eosinophils Automated: 1.3 %
Hematocrit: 41.9 % (ref 37.0–47.0)
Hgb: 12.8 g/dL (ref 12.0–16.0)
Immature Granulocytes Absolute: 0.02 10*3/uL
Immature Granulocytes: 0.3 %
Lymphocytes Absolute Automated: 1.43 10*3/uL (ref 0.50–4.40)
Lymphocytes Automated: 23.2 %
MCH: 29.8 pg (ref 28.0–32.0)
MCHC: 30.5 g/dL — ABNORMAL LOW (ref 32.0–36.0)
MCV: 97.7 fL (ref 80.0–100.0)
MPV: 10.5 fL (ref 9.4–12.3)
Monocytes Absolute Automated: 0.58 10*3/uL (ref 0.00–1.20)
Monocytes: 9.4 %
Neutrophils Absolute: 3.99 10*3/uL (ref 1.80–8.10)
Neutrophils: 64.8 %
Nucleated RBC: 0 /100 WBC (ref 0.0–1.0)
Platelets: 314 10*3/uL (ref 140–400)
RBC: 4.29 10*6/uL (ref 4.20–5.40)
RDW: 12 % (ref 12–15)
WBC: 6.16 10*3/uL (ref 3.50–10.80)

## 2017-06-18 LAB — C-REACTIVE PROTEIN: C-Reactive Protein: 0.1 mg/dL (ref 0.0–0.8)

## 2017-06-18 LAB — IRON PROFILE
Iron Saturation: 18 % (ref 15–50)
Iron: 65 ug/dL (ref 40–145)
TIBC: 370 ug/dL (ref 265–497)
UIBC: 305 ug/dL (ref 126–382)

## 2017-06-18 LAB — FERRITIN: Ferritin: 49 ng/mL (ref 4.60–204.00)

## 2017-06-18 MED ORDER — METHYLPHENIDATE HCL 5 MG PO TABS
ORAL_TABLET | ORAL | 0 refills | Status: DC
Start: 2017-06-18 — End: 2017-09-18

## 2017-06-18 MED ORDER — METHYLPHENIDATE HCL 5 MG PO TABS
5.0000 mg | ORAL_TABLET | Freq: Four times a day (QID) | ORAL | 0 refills | Status: DC
Start: 2017-06-18 — End: 2017-09-24

## 2017-06-18 NOTE — Progress Notes (Signed)
Subjective:      Patient ID: Teresa [name]  is a 46 y.o.  female.     Chief Complaint   Patient presents with   . 3 months follow up        HPI    Iron deficiency-she has gone back on her iron tablets every other day.  It seems to be helping.  She is seeing the gastrointestinal doctor next week.    Poor sleep-has been getting worse.  Some of it is her neck pain but also life stress.  She talked to the GYN about hormones but did not get a satisfactory answer.  She may want to see another GYN in the future.    Attention deficit/hyperactivity disorder-still taking the Ritalin.  No change.  She does not think it is affecting her sleep.She would like to continue with the same way.    Joint pain-no improvement.  She is still trying to get in to see the EDS specialist.  She would like to have another test for rheumatoid arthritis.  She has not seen the rheumatologist recently.      Problem   Tinnitus of Left Ear    Pulsatile tinnitus still present, work up negative so far            Past Medical History:   Diagnosis Date   . ADHD (attention deficit hyperactivity disorder)    . Anemia     constant   . Anxiety    . Arrhythmia     pt states she belives anemia led to heart flutters in the past.   . Asthma    . Depression    . Dysphagia    . Eczema    . Fibromyalgia    . Gastroesophageal reflux disease    . Headache     migraine, sensitivity to light   . Heart burn    . Hypercholesterolemia 12/24/2015   . Malignant neoplasm of colon 2009    chemo   . Malignant neoplasm of skin 2007    basal   . Osteoporosis, idiopathic     Not sure.  Have had osteopenia in past but not now.   . Post-operative nausea and vomiting    . Psoriasis    . Seasonal allergic rhinitis    . Sleep apnea     does not use CPAP       Review of Systems    no fevers, chills, chest pain, shortness of breath.  Has fatigue that is getting worse.  Has joint pain that is worsening.      Objective:     BP 126/76 (BP Site: Left arm, Patient Position: Sitting,  Cuff Size: Medium)   Pulse 82   Temp 98.8 F (37.1 C) (Oral)   Ht 1.562 m (5' 1.5")   Wt 59.9 kg (132 lb)   SpO2 98%   BMI 24.54 kg/m     Physical Exam   Cardiovascular: Normal rate.    Pulmonary/Chest: Effort normal.   Psychiatric: Her behavior is normal. Judgment and thought content normal.   Tired appearing     Vital signs reviewed     Assessment and Plan:     Attention deficit hyperactivity disorder (ADHD), combined type  Stable.  Refill given.  - methylphenidate (RITALIN) 5 MG tablet; Take 1 tablet (5 mg total) by mouth 4 (four) times daily.  Dispense: 120 tablet; Refill: 0  - methylphenidate (RITALIN) 5 MG tablet; Take QID.  Fill 60 days from date  on rx  Dispense: 120 tablet; Refill: 0  - methylphenidate (RITALIN) 5 MG tablet; Take QID.  Fill 30 days from date on rx  Dispense: 120 tablet; Refill: 0     Hypermobility syndrome   Cervical spine disease  Discussed her pain regimen again.  She would like to have these labs checked.  She is seen rheumatology in the past.  Her mother was recently diagnosed with hypermobility syndrome so she thinks her EDS diagnosis is more likely.  - C Reactive Protein  - Cyclic citrul peptide antibody, IgG    Iron deficiency anemia, unspecified iron deficiency anemia type  Stable.  Going to see gastroenterology.  Check labs.  - Ferritin  - IRON PROFILE  - CBC and differential    Tinnitus of left ear  No change.  Evaluation has been thorough and negative.    Discussed red flags to return for reevaluation.  Risk & Benefits of any previous or new medication(s) were explained to the patient who verbalized understanding & agreed to the treatment plan.     Call with updates/questions/concerns.    Ellery Plunk, M.D.

## 2017-06-18 NOTE — Progress Notes (Signed)
Have you seen any specialists/other providers since your last visit with [place]?      Yes, GYN, neurology      Arm preference verified?     Yes    The patient is due for influenza vaccine.    Patient presented to the office for FLu shot administration.  Received injection in the Left arm.  No reaction was noted and patient left in good condition.

## 2017-06-18 NOTE — Addendum Note (Signed)
Addended by: Alonna Buckler on: 06/18/2017 08:59 AM     Modules accepted: Orders

## 2017-06-20 LAB — IMAGE GUIDED PAP WITH APTIMA HPV TP
.: 0
HPV APTIMA: NEGATIVE

## 2017-06-20 LAB — CYCLIC CITRUL PEPTIDE ANTIBODY, IGG: CCP, Antibody IgG: <16 (ref <20)

## 2017-06-24 NOTE — Progress Notes (Signed)
Pap and hpv negative.

## 2017-06-27 ENCOUNTER — Encounter (INDEPENDENT_AMBULATORY_CARE_PROVIDER_SITE_OTHER): Payer: Self-pay | Admitting: Family Medicine

## 2017-07-02 DIAGNOSIS — C189 Malignant neoplasm of colon, unspecified: Secondary | ICD-10-CM | POA: Insufficient documentation

## 2017-07-02 DIAGNOSIS — K649 Unspecified hemorrhoids: Secondary | ICD-10-CM | POA: Insufficient documentation

## 2017-07-02 DIAGNOSIS — K219 Gastro-esophageal reflux disease without esophagitis: Secondary | ICD-10-CM | POA: Insufficient documentation

## 2017-07-02 DIAGNOSIS — D649 Anemia, unspecified: Secondary | ICD-10-CM | POA: Insufficient documentation

## 2017-07-02 DIAGNOSIS — K602 Anal fissure, unspecified: Secondary | ICD-10-CM | POA: Insufficient documentation

## 2017-07-02 DIAGNOSIS — C4490 Unspecified malignant neoplasm of skin, unspecified: Secondary | ICD-10-CM | POA: Insufficient documentation

## 2017-07-02 DIAGNOSIS — M797 Fibromyalgia: Secondary | ICD-10-CM | POA: Insufficient documentation

## 2017-07-02 DIAGNOSIS — K589 Irritable bowel syndrome without diarrhea: Secondary | ICD-10-CM | POA: Insufficient documentation

## 2017-07-02 DIAGNOSIS — M1990 Unspecified osteoarthritis, unspecified site: Secondary | ICD-10-CM | POA: Insufficient documentation

## 2017-07-06 ENCOUNTER — Encounter (INDEPENDENT_AMBULATORY_CARE_PROVIDER_SITE_OTHER): Payer: Self-pay

## 2017-07-07 ENCOUNTER — Encounter (INDEPENDENT_AMBULATORY_CARE_PROVIDER_SITE_OTHER): Payer: Self-pay

## 2017-07-11 ENCOUNTER — Encounter (INDEPENDENT_AMBULATORY_CARE_PROVIDER_SITE_OTHER): Payer: Self-pay | Admitting: Neurology

## 2017-07-14 ENCOUNTER — Encounter (INDEPENDENT_AMBULATORY_CARE_PROVIDER_SITE_OTHER): Payer: Self-pay | Admitting: Family Medicine

## 2017-07-14 NOTE — Progress Notes (Signed)
Subjective:      Patient ID: Teresa [name] is a 46 y.o. female     Chief Complaint   Patient presents with   . Neck Pain   . Back Pain        HPI   Patient with history of cervical dystonia, hypermobility syndrome without diagnosis of Ehlers-Danlos.  She has seen neurology and received Botox injections in the past.  She was seeing physical therapy, had complained of autonomic symptoms, including fecal and urinary incontinence, urgency, lightheadedness.  She was sugested to neurosurgery and orthopedics at her last visit therapy visit. She has an appt with a nsg who also deals with hypermobility disorder. She needs an upright MRI with flexion/extension, and rotational CT scan ordered.      The following sections were reviewed this encounter by the provider:   Allergies  Meds  Problems         Review of Systems   She uses a cervical collar, as she has pain with nodding her head. Worse with metro rides.      BP 111/74 (BP Site: Left arm, Patient Position: Sitting, Cuff Size: Medium)   Pulse 84   Temp 97.4 F (36.3 C) (Oral)   Wt 61.1 kg (134 lb 12.8 oz)   SpO2 98%   BMI 25.06 kg/m      Objective:     Physical Exam   Constitutional: She is oriented to person, place, and time. She appears well-developed and well-nourished.   HENT:   Guarded rotation. Pt rotates neck from shoulders. Wearing soft cervical collar.    Neurological: She is alert and oriented to person, place, and time.   Psychiatric: She has a normal mood and affect. Her behavior is normal.   Nursing note and vitals reviewed.    Assessment:     1. Neck pain  - Referral to [facility]      Plan:     Patient Instructions   I will complete your neurosurgery referral.  Please see [place] [facility] in the interim.  I will find out which scans are needed for you.      [name], MD

## 2017-07-15 ENCOUNTER — Encounter (INDEPENDENT_AMBULATORY_CARE_PROVIDER_SITE_OTHER): Payer: Self-pay | Admitting: Family Medicine

## 2017-07-15 ENCOUNTER — Ambulatory Visit (INDEPENDENT_AMBULATORY_CARE_PROVIDER_SITE_OTHER): Payer: 59 | Admitting: Family Medicine

## 2017-07-15 VITALS — BP 111/74 | HR 84 | Temp 97.4°F | Wt 134.8 lb

## 2017-07-15 DIAGNOSIS — M542 Cervicalgia: Secondary | ICD-10-CM

## 2017-07-15 NOTE — Patient Instructions (Signed)
I will complete your neurosurgery referral.  Please see [place] [facility] in the interim.  I will find out which scans are needed for you.

## 2017-07-15 NOTE — Progress Notes (Signed)
Have you seen any specialists/other providers since your last visit with [place]?    Yes, Dr Neurology at [facility], PT    Arm preference verified?   Yes    The patient is due for nothing at this time, HM is up-to-date.

## 2017-07-16 ENCOUNTER — Other Ambulatory Visit (INDEPENDENT_AMBULATORY_CARE_PROVIDER_SITE_OTHER): Payer: Self-pay | Admitting: Family Medicine

## 2017-07-16 ENCOUNTER — Encounter (INDEPENDENT_AMBULATORY_CARE_PROVIDER_SITE_OTHER): Payer: Self-pay

## 2017-07-16 ENCOUNTER — Encounter (INDEPENDENT_AMBULATORY_CARE_PROVIDER_SITE_OTHER): Payer: Self-pay | Admitting: Family Medicine

## 2017-07-16 DIAGNOSIS — M357 Hypermobility syndrome: Secondary | ICD-10-CM

## 2017-07-16 DIAGNOSIS — G243 Spasmodic torticollis: Secondary | ICD-10-CM

## 2017-07-16 DIAGNOSIS — M542 Cervicalgia: Secondary | ICD-10-CM

## 2017-07-17 ENCOUNTER — Ambulatory Visit: Payer: 59

## 2017-07-18 ENCOUNTER — Encounter (INDEPENDENT_AMBULATORY_CARE_PROVIDER_SITE_OTHER): Payer: Self-pay | Admitting: Hematology & Oncology

## 2017-07-20 ENCOUNTER — Other Ambulatory Visit (INDEPENDENT_AMBULATORY_CARE_PROVIDER_SITE_OTHER): Payer: Self-pay

## 2017-07-20 ENCOUNTER — Other Ambulatory Visit (INDEPENDENT_AMBULATORY_CARE_PROVIDER_SITE_OTHER): Payer: Self-pay | Admitting: Family Medicine

## 2017-07-20 ENCOUNTER — Emergency Department
Admission: EM | Admit: 2017-07-20 | Discharge: 2017-07-20 | Disposition: A | Discharge status: Home / Self Care | Payer: 59 | Attending: Emergency Medicine | Admitting: Emergency Medicine

## 2017-07-20 DIAGNOSIS — Z79899 Other long term (current) drug therapy: Secondary | ICD-10-CM | POA: Insufficient documentation

## 2017-07-20 DIAGNOSIS — M542 Cervicalgia: Secondary | ICD-10-CM | POA: Insufficient documentation

## 2017-07-20 DIAGNOSIS — Z9221 Personal history of antineoplastic chemotherapy: Secondary | ICD-10-CM | POA: Insufficient documentation

## 2017-07-20 DIAGNOSIS — C189 Malignant neoplasm of colon, unspecified: Secondary | ICD-10-CM

## 2017-07-20 DIAGNOSIS — Z85038 Personal history of other malignant neoplasm of large intestine: Secondary | ICD-10-CM | POA: Insufficient documentation

## 2017-07-20 DIAGNOSIS — M357 Hypermobility syndrome: Secondary | ICD-10-CM

## 2017-07-20 DIAGNOSIS — Z85828 Personal history of other malignant neoplasm of skin: Secondary | ICD-10-CM | POA: Insufficient documentation

## 2017-07-20 DIAGNOSIS — J45909 Unspecified asthma, uncomplicated: Secondary | ICD-10-CM | POA: Insufficient documentation

## 2017-07-20 DIAGNOSIS — G243 Spasmodic torticollis: Secondary | ICD-10-CM

## 2017-07-20 LAB — CBC AND DIFFERENTIAL
Absolute NRBC: 0 10*3/uL
Basophils Absolute Automated: 0.05 10*3/uL (ref 0.00–0.20)
Basophils Automated: 0.5 %
Eosinophils Absolute Automated: 0.06 10*3/uL (ref 0.00–0.70)
Eosinophils Automated: 0.6 %
Hematocrit: 39.2 % (ref 37.0–47.0)
Hgb: 12.8 g/dL (ref 12.0–16.0)
Immature Granulocytes Absolute: 0.03 10*3/uL
Immature Granulocytes: 0.3 %
Lymphocytes Absolute Automated: 2.04 10*3/uL (ref 0.50–4.40)
Lymphocytes Automated: 19.3 %
MCH: 30.3 pg (ref 28.0–32.0)
MCHC: 32.7 g/dL (ref 32.0–36.0)
MCV: 92.7 fL (ref 80.0–100.0)
MPV: 9.4 fL (ref 9.4–12.3)
Monocytes Absolute Automated: 0.9 10*3/uL (ref 0.00–1.20)
Monocytes: 8.5 %
Neutrophils Absolute: 7.48 10*3/uL (ref 1.80–8.10)
Neutrophils: 70.8 %
Nucleated RBC: 0 /100 WBC (ref 0.0–1.0)
Platelets: 356 10*3/uL (ref 140–400)
RBC: 4.23 10*6/uL (ref 4.20–5.40)
RDW: 12 % (ref 12–15)
WBC: 10.56 10*3/uL (ref 3.50–10.80)

## 2017-07-20 LAB — BASIC METABOLIC PANEL
BUN: 16 mg/dL (ref 7.0–19.0)
CO2: 25 mEq/L (ref 22–29)
Calcium: 10.1 mg/dL (ref 8.5–10.5)
Chloride: 106 mEq/L (ref 100–111)
Creatinine: 0.7 mg/dL (ref 0.6–1.0)
Glucose: 107 mg/dL — ABNORMAL HIGH (ref 70–100)
Potassium: 3.8 mEq/L (ref 3.5–5.1)
Sodium: 142 mEq/L (ref 136–145)

## 2017-07-20 LAB — POCT PREGNANCY TEST, URINE HCG: POCT Pregnancy HCG Test, UR: NEGATIVE

## 2017-07-20 LAB — GFR: EGFR: >60

## 2017-07-20 MED ORDER — ACETAMINOPHEN 500 MG PO TABS
1000.0000 mg | ORAL_TABLET | Freq: Once | ORAL | Status: AC
Start: 2017-07-20 — End: 2017-07-20
  Administered 2017-07-20: 20:00:00 1000 mg via ORAL
  Filled 2017-07-20: qty 2

## 2017-07-20 MED ORDER — ONDANSETRON HCL 4 MG/2ML IJ SOLN
4.0000 mg | Freq: Once | INTRAMUSCULAR | Status: DC
Start: 2017-07-20 — End: 2017-07-21
  Filled 2017-07-20: qty 2

## 2017-07-20 NOTE — ED Notes (Signed)
Bed: N 32  Expected date:   Expected time:   Means of arrival:   Comments:  FFX [phone]

## 2017-07-20 NOTE — ED Provider Notes (Signed)
[facility] EMERGENCY DEPARTMENT H&P      Visit date: 07/20/2017      CLINICAL SUMMARY           Diagnosis:    .     Final diagnoses:   Neck pain         MDM Notes:    Teresa [name] is a 46 y.o. female who presents with acute on chronic neck pain. Had sensation of left sided deficit worse than baseline but now back to baseline. Strength is intact and rest of neuro exam is intact. No fever, or sign of meningismus. No trauma and spine has no step-offs.  Patient feels much better after Aspen collar placed and pain is back to baseline. Neuro symptoms also back to baseline. No new HA or sign or hx to suggest CVA, TIA or ICH. Labs are unremarkable. Discussed patient case with neurosurgery, who states no indication for emergent imaging or intervention, but recommends patient continue with planned outpatient MRI and CT imaging. Offered patient CT head and neck but declined and wants to wait for "proper" imaging as planned by her neurosurgeon. Patient declined pain meds and wants to be discharged with outpatient follow up. Does not want to wait for neurosurgery to eval her in ED. Will [place] with neurosurgery/neurology/PMD follow up. Patient and husband agrees with plan, follow up and understands return precautions.         Disposition:         Discharge         Discharge Prescriptions     None                      CLINICAL INFORMATION        HPI:      Chief Complaint: No chief complaint on file.  .    Teresa [name] is a 46 y.o. female who presents with worsening fluctuating persistent severe neck pain since 1300.    History of neck pain, anxiety, depression, asthma, headaches, anemia, malignant neoplasm of colon and of skin. Eczema, osteoporosis, HLD, fibromyalgia, hypermobility spectrum disorder.     She states that her pain can range from a 3-9/10 on any given day. She reports multiple kinds of neck pain. With the worst being the pain that starts at the occiput which is worsened  by nodding her head. She notes that the last botox shot worsened her pain. None of her shots were midline.   Initially her neck felt wobbly for a few days. Her pain got worse and then improved.  She reports that she has been having botox shots to help with pain, most recent 3 weeks ago done by her neurologist. Has an MRI and CT scheduled by neurosurgery at as outpatient. States MRI and CTs are "special" studies with flexion and angling.     Today her pain started around 1300 and has progressively worsened throughout the day. She notes that starting tonight she experienced a new symptom of numbness in her both hands, arms, chest, face, and head, which was equal  She describes her pain as being everything above her shoulders. Any kind of movement or exertion worsens it.   Her husband notes that she had a "funky pulse" that they noted on a home oximeter which had read in the high 80s for O2.   He also notes that she started shaking from the pain and screamed for 30 minutes.   She had a C collar placed in ED which  has significantly improved her pain.   Currently, at baseline pain and denies any neuro symptoms at ths tome.     She notes that she has multiple allergies to medicines which makes it difficult to find suitable medications.     Denies new HA, fever, neck stiffness, URI symptoms, focal weakness, vomiting, new back pain, chest pain now, sob, abdominal pain, dysuria, incontinence.     History obtained from: Patient          ROS:      Review of Systems   Constitutional: Negative.    HENT: Negative.    Eyes: Positive for photophobia.   Respiratory: Negative.    Cardiovascular: Negative.    Gastrointestinal: Positive for constipation. Negative for diarrhea and vomiting.   Endocrine: Negative for polyuria.   Genitourinary: Negative for dysuria.   Musculoskeletal: Positive for neck pain.   Neurological: Positive for numbness (bilateral upper extremities, neck, face, head) and headaches.   Psychiatric/Behavioral:  Negative.            Physical Exam:      Pulse 92  BP 165/82  Resp 20  SpO2 100 %  Temp 97.7 F (36.5 C)    Physical Exam   Constitutional: She is oriented to person, place, and time. She appears well-developed and well-nourished.   HENT:   Head: Normocephalic and atraumatic.   Nose: Nose normal.   Mouth/Throat: Uvula is midline and oropharynx is clear and moist.   Eyes: Pupils are equal, round, and reactive to light. Conjunctivae and EOM are normal.   Neck: Trachea normal, full passive range of motion without pain and phonation normal. Neck supple.   Diffuse C spine tenderness   Cardiovascular: Normal rate, regular rhythm and intact distal pulses.    Pulmonary/Chest: Effort normal and breath sounds normal. No respiratory distress.   Abdominal: Soft. Normal appearance and bowel sounds are normal. She exhibits no distension and no mass. There is no tenderness. There is no rebound and no guarding.   Musculoskeletal:   Strength 5/5 all extremities.    Neurological: She is alert and oriented to person, place, and time. She has normal strength. A sensory deficit (diffusely decreased left sided sensation, worse on left upper extremity) is present. No cranial nerve deficit. Coordination and gait normal.   sensory deficit is patient baseline   Skin: Skin is warm, dry and intact.   Psychiatric: She has a normal mood and affect.   Nursing note and vitals reviewed.               PAST HISTORY        Primary Care Provider: Eyvonne Left, MD        PMH/PSH:    .     Past Medical History:   Diagnosis Date   . ADHD (attention deficit hyperactivity disorder)    . Anemia     constant   . Anxiety    . Arrhythmia     pt states she belives anemia led to heart flutters in the past.   . Asthma    . Depression    . Dysphagia    . Eczema    . Fibromyalgia    . Gastroesophageal reflux disease    . Headache     migraine, sensitivity to light   . Heart burn    . Hypercholesterolemia 12/24/2015   . Malignant neoplasm of colon 2009     chemo   . Malignant neoplasm of skin 2007  basal   . Neck pain    . Osteoporosis, idiopathic     Not sure.  Have had osteopenia in past but not now.   . Post-operative nausea and vomiting    . Psoriasis    . Seasonal allergic rhinitis    . Sleep apnea     does not use CPAP       She has a past surgical history that includes Colectomy (2009); EGD (2014); Colonoscopy (2014); Reduction mammaplasty (2006); uterine ablation  (2011); EGD, DILATION (N/A, 11/13/2014); Colonoscopy (N/A, 11/13/2014); and Appendectomy.      Social/Family History:      She reports that she has never smoked. She has never used smokeless tobacco. She reports that she does not drink alcohol or use drugs.    Family History   Problem Relation Age of Onset   . Colon cancer Father    . Cancer Father         colon   . Early death Father         Age 62, colon cancer   . Depression Mother    . Diabetes Mother         Pre-diabetes; not diabetes   . Heart disease Mother    . Hyperlipidemia Mother    . Vision loss Mother         cataracts and glaucoma   . Obesity Maternal Aunt          Listed Medications on Arrival:    .     Home Medications     Med List Status:  In Progress Set By: Myrtie Hawk, RN at 07/20/2017  6:11 PM                acetaminophen (TYLENOL) 500 MG tablet          albuterol (PROVENTIL HFA;VENTOLIN HFA) 108 (90 Base) MCG/ACT inhaler     Inhale 2 puffs into the lungs every 4 (four) hours as needed for Wheezing.     Ascorbic Acid (VITAMIN C) 250 MG tablet     Take 500 mg by mouth every other day.         betamethasone, augmented, (DIPROLENE) 0.05 % cream     APPLY TWICE DAILY TO THE AFFECTED AREAS FOR 5-7 DAYS FOR ECZEMA.     Biotin 1000 MCG tablet     10,000 mcg.         BOTOX 200 units Recon Soln     100 Units.         buPROPion (WELLBUTRIN) 75 MG tablet     Take 1/4 pill q day     Calcium Citrate 200 MG Tab     Take 600 mg by mouth daily.        CINNAMON PO     Take by mouth.     diphenhydrAMINE (BENADRYL) 50 MG tablet     Take 50 mg by  mouth nightly as needed for Itching.     estradiol (ESTRACE) 0.1 MG/GM vaginal cream     Place 1 g vaginally twice a week.     ferrous sulfate 325 (65 FE) MG tablet     Take 325 mg by mouth every other day.     L-Lysine 1000 MG Tab     Take 1,300 mg by mouth daily.        L-Theanine 100 MG Cap     Take 200 mg by mouth 2 (two) times daily.  MAGNESIUM CHLORIDE PO     Take 150 mg by mouth nightly.     methylphenidate (RITALIN) 5 MG tablet     Take 1 tablet (5 mg total) by mouth 4 (four) times daily.     methylphenidate (RITALIN) 5 MG tablet     Take QID.  Fill 60 days from date on rx     methylphenidate (RITALIN) 5 MG tablet     Take QID.  Fill 30 days from date on rx     montelukast (SINGULAIR) 4 MG Packet     Take 2 packets (8 mg total) by mouth nightly.     Multiple Vitamin (MULTIVITAMIN) tablet     Take 1 tablet by mouth daily.     Omega-3 Fatty Acids (OMEGA-3 FISH OIL) 500 MG Cap     Take 1,280 mg by mouth daily.         PREVIDENT 5000 BOOSTER PLUS 1.1 % Paste     APPLY TO TEETH AFTER REGULAR BRUSHING AND FLOSSING     RESTASIS 0.05 % ophthalmic emulsion     APPLY ONE DROP IN EACH EYE 2 TIMES A DAY     Turmeric Curcumin 500 MG Cap     Take by mouth.     Ubiquinol 200 MG Cap     Take 200 mg by mouth every morning.                   Allergies: She is allergic to codeine; morphine; advair diskus [fluticasone-salmeterol]; epinephrine; iodine; dairy [milk-related compounds]; lidocaine; penicillins; and wheat extract.            VISIT INFORMATION        Clinical Course in the ED:          ED Course as of Jul 25 233   Mon Jul 20, 2017   2140 Neurosurgery says no acute intervention. Continue with planned outpatient schedule.   [HN]      ED Course User Index  [HN] Karoline Caldwell         Medications Given in the ED:    .     ED Medication Orders     Start Ordered     Status Ordering Provider    07/20/17 1919 07/20/17 1918  acetaminophen (TYLENOL) tablet 1,000 mg  Once     Route: Oral  Ordered Dose: 1,000 mg     Last  MAR action:  Given DYNIN, Presten Joost    07/20/17 1919 07/20/17 1918    Once     Route: Intravenous  Ordered Dose: 4 mg     Discontinued DYNIN, Deicy Rusk            Procedures:      Procedures      Interpretations:      O2 sat-           saturation: 100 %; Oxygen use: room air; Interpretation: Normal    EKG -             interpreted by me: Rate 81 sinus. No STEMI. Intervals within normal limits.   Monitor -         interpreted by me: normal sinus at 92.                 RESULTS        Lab Results:      Results     Procedure Component Value Units Date/Time    Basic Metabolic Panel [[id number]]  (Abnormal) Collected:  07/20/17 1939    Specimen:  Blood Updated:  07/20/17 2010     Glucose 107 (H) mg/dL      BUN 16.1 mg/dL      Creatinine 0.7 mg/dL      Calcium 09.6 mg/dL      Sodium 045 mEq/L      Potassium 3.8 mEq/L      Chloride 106 mEq/L      CO2 25 mEq/L     GFR [[id number]] Collected:  07/20/17 1939     Updated:  07/20/17 2010     EGFR >60.0    CBC with differential [[id number]] Collected:  07/20/17 1939    Specimen:  Blood from Blood Updated:  07/20/17 2000     WBC 10.56 x10 3/uL      Hgb 12.8 g/dL      Hematocrit 08.6 %      Platelets 356 x10 3/uL      RBC 4.23 x10 6/uL      MCV 92.7 fL      MCH 30.3 pg      MCHC 32.7 g/dL      RDW 12 %      MPV 9.4 fL      Neutrophils 70.8 %      Lymphocytes Automated 19.3 %      Monocytes 8.5 %      Eosinophils Automated 0.6 %      Basophils Automated 0.5 %      Immature Granulocyte 0.3 %      Nucleated RBC 0.0 /100 WBC      Neutrophils Absolute 7.48 x10 3/uL      Abs Lymph Automated 2.04 x10 3/uL      Abs Mono Automated 0.90 x10 3/uL      Abs Eos Automated 0.06 x10 3/uL      Absolute Baso Automated 0.05 x10 3/uL      Absolute Immature Granulocyte 0.03 x10 3/uL      Absolute NRBC 0.00 x10 3/uL     Urine HCG, POC/ Qualitative [[id number]] Collected:  07/20/17 1935    Specimen:  Urine Updated:  07/20/17 1938     POCT QC Pass     POCT Pregnancy HCG Test, UR Negative     Comment: Negative Value  is Normal in Healthy Males or Healthy non-pregnant Females              Radiology Results:      No orders to display               Scribe Attestation:      I was acting as a Neurosurgeon for Merrilee Seashore, MD on [facility]  Treatment Team: Scribe: Karoline Caldwell     I am the first provider for this patient and I personally performed the services documented. Treatment Team: Scribe: Karoline Caldwell is scribing for me on [facility]. This note and the patient instructions accurately reflect work and decisions made by me.  Merrilee Seashore, MD            Merrilee Seashore, MD  07/25/17 [phone]

## 2017-07-20 NOTE — Discharge Instructions (Signed)
Dear Teresa [name]:    Thank you for choosing the [name] Emergency Department, the premier emergency department in the [place] area.  I hope your visit today was EXCELLENT.    Specific instructions for your visit today:    Keep neck in C collar. Continue to use home pain medication. Follow up with neurosurgery, get your MRIs and CTs as prescribed. Return if you develop fever, sudden new numbness or weakness, severe pain that cannot be controlled at home, persistent vomiting or for other concerns.    Cervical Radiculopathy    You have been seen for a cervical radiculopathy.    Your spine has bones called "vertebrae." In between the bones there are soft cushions. These are called "disks." The disks keep the vertebrae from rubbing against each other. Inside of each disk is a thick, jelly-like substance called the "nucleus pulposus." Sometimes when the spine is injured, a disk is damaged and the nucleus pulposus leaks out of the disk. This is called a "disk herniation." As people age, the disks get brittle and delicate. If this happens, you might have a disk herniation. This is possible even if you don't remember getting injured.    Sometimes a herniated disk presses on a nerve in the spine. This causes pain near the herniated disk. Since you have symptoms in your neck and arm, the problem is in the cervical (neck) spine. Other problems can cause a radiculopathy (nerve pain), including a narrowed opening where the nerves come out.    Some symptoms of a cervical radiculopathy are:     Pain down one of your arms.   A burning feeling down your arm.   Numbness (pins and needles or loss of feeling) in your arm.   In serious cases, your arm may feel weak.    This problem is often treated with rest, physical therapy, and medication for the swelling and pain. If these treatments don't work, surgery may be needed. Sometimes taking steroids (like Prednisone) for a few days can help the pain.    We don't  believe your condition is dangerous right now. However, you need to be careful. Sometimes a problem that seems small can get serious later. Therefore, it is very important for you to come back here or go to the nearest Emergency Department if you don't get better or your symptoms get worse.     You may have been referred to get an MRI of your spine or an EMG. These tests look at your problem more closely.    Follow up with your doctor or the referral doctor as soon as possible.    YOU SHOULD SEEK MEDICAL ATTENTION IMMEDIATELY, EITHER HERE OR AT THE NEAREST EMERGENCY DEPARTMENT, IF ANY OF THE FOLLOWING OCCURS:     You lose bowel or bladder control (you soil or wet yourself).   You feel week or can't use your arm(s).   The medication doesn't help the pain.   You have a fever (temperature higher than 100.4F or 38C) or shaking chills.   You have serious pain over one bone (vertebra) in your neck.    If you can't follow up with your doctor, or if at any time you feel you need to be rechecked or seen again, come back here or go to the nearest emergency department.                If you do not continue to improve or your condition worsens, please contact your doctor  or return immediately to the Emergency Department.    Sincerely,  Merrilee Seashore, MD  Attending Emergency Physician  [facility] Emergency Department    ONSITE PHARMACY  Our full service onsite pharmacy is located in the ER waiting room.  Open 7 days a week from 9 am to 11 pm.  We accept all major insurances and prices are competitive with major retailers.  Ask your provider to print your prescriptions down to the pharmacy to speed you on your way home.    OBTAINING A PRIMARY CARE APPOINTMENT    Primary care physicians (PCPs, also known as primary care doctors) are either internists or family medicine doctors. Both types of PCPs focus on health promotion, disease prevention, patient education and counseling, and treatment of acute and  chronic medical conditions.    Call for an appointment with a primary care doctor.  Ask to see who is taking new patients.     [facility]  telephone:  [phone]  [email or web address]    DOCTOR REFERRALS  Call [phone] (available 24 hours a day, 7 days a week) if you need any further referrals and we can help you find a primary care doctor or specialist.  Also, available online at:  [email or web address]    YOUR CONTACT INFORMATION  Before leaving please check with registration to make sure we have an up-to-date contact number.  You can call registration at [phone] to update your information.  For questions about your hospital bill, please call [phone].  For questions about your Emergency Dept Physician bill please call [phone].      FREE HEALTH SERVICES  If you need help with health or social services, please call 2-1-1 for a free referral to resources in your area.  2-1-1 is a free service connecting people with information on health insurance, free clinics, pregnancy, mental health, dental care, food assistance, housing, and substance abuse counseling.  Also, available online at:  http://www.[facility].org    MEDICAL RECORDS AND TESTS  Certain laboratory test results do not come back the same day, for example urine cultures.   We will contact you if other important findings are noted.  Radiology films are often reviewed again to ensure accuracy.  If there is any discrepancy, we will notify you.      Please call [phone] to pick up a complimentary CD of any radiology studies performed.  If you or your doctor would like to request a copy of your medical records, please call [phone].      ORTHOPEDIC INJURY   Please know that significant injuries can exist even when an initial x-ray is read as normal or negative.  This can occur because some fractures (broken bones) are not initially visible on x-rays.  For this reason, close outpatient follow-up with your  primary care doctor or bone specialist (orthopedist) is required.    MEDICATIONS AND FOLLOWUP  Please be aware that some prescription medications can cause drowsiness.  Use caution when driving or operating machinery.    The examination and treatment you have received in our Emergency Department is provided on an emergency basis, and is not intended to be a substitute for your primary care physician.  It is important that your doctor checks you again and that you report any new or remaining problems at that time.      24 HOUR PHARMACIES  The nearest 24 hour pharmacy is:    [facility]  [address]  [place], [place] [address]  [phone]      ASSISTANCE WITH INSURANCE    Affordable Care Act  (ACA)  Call to start or finish an application, compare plans, enroll or ask a question.  [phone]  TTY: [phone]  Web:  Healthcare.gov    Help Enrolling in [facility]  [employer] [place]  930-033-6973 (TOLL-FREE)  [phone] (TTY)  Web:  Http://www.coverva.org    Local Help Enrolling in the [facility]  [place] [facility]  [phone] (MAIN)  Email:  health-help@nvfs .org  Web:  BlackjackMyths.is  Address:  [address], [address] [place], [place] [address]    SEDATING MEDICATIONS  Sedating medications include strong pain medications (e.g. narcotics), muscle relaxers, benzodiazepines (used for anxiety and as muscle relaxers), Benadryl/diphenhydramine and other antihistamines for allergic reactions/itching, and other medications.  If you are unsure if you have received a sedating medication, please ask your physician or nurse.  If you received a sedating medication: DO NOT drive a car. DO NOT operate machinery. DO NOT perform jobs where you need to be alert.  DO NOT drink alcoholic beverages while taking this medicine.     If you get dizzy, sit or lie down at the first signs. Be careful going up and down stairs.  Be extra careful to prevent falls.     Never give this medicine to  others.     Keep this medicine out of reach of children.     Do not take or save old medicines. Throw them away when outdated.     Keep all medicines in a cool, dry place. DO NOT keep them in your bathroom medicine cabinet or in a cabinet above the stove.    MEDICATION REFILLS  Please be aware that we cannot refill any prescriptions through the ER. If you need further treatment from what is provided at your ER visit, please follow up with your primary care doctor or your pain management specialist.    FREESTANDING EMERGENCY DEPARTMENTS OF [facility]  Did you know [facility] has two freestanding ERs located just a few miles away?  [facility] [place] and [facility] [place] have short wait times, easy free parking directly in front of the building and top patient satisfaction scores - and the same Board Certified Emergency Medicine doctors as [facility].

## 2017-07-21 ENCOUNTER — Other Ambulatory Visit (INDEPENDENT_AMBULATORY_CARE_PROVIDER_SITE_OTHER): Payer: Self-pay

## 2017-07-21 DIAGNOSIS — M357 Hypermobility syndrome: Secondary | ICD-10-CM

## 2017-07-21 DIAGNOSIS — F5101 Primary insomnia: Secondary | ICD-10-CM

## 2017-07-21 DIAGNOSIS — Z8669 Personal history of other diseases of the nervous system and sense organs: Secondary | ICD-10-CM

## 2017-07-21 DIAGNOSIS — M797 Fibromyalgia: Secondary | ICD-10-CM

## 2017-07-21 LAB — ECG 12-LEAD
Atrial Rate: 81 {beats}/min
P Axis: 47 degrees
P-R Interval: 144 ms
Q-T Interval: 370 ms
QRS Duration: 76 ms
QTC Calculation (Bezet): 429 ms
R Axis: 45 degrees
T Axis: 61 degrees
Ventricular Rate: 81 {beats}/min

## 2017-07-21 MED ORDER — AMITRIPTYLINE HCL 10 MG PO TABS
10.0000 mg | ORAL_TABLET | Freq: Every evening | ORAL | 8 refills | Status: DC
Start: 2017-07-21 — End: 2017-07-22

## 2017-07-21 NOTE — Telephone Encounter (Signed)
[facility] Amit[facility]yline 10 mg tab.

## 2017-07-22 ENCOUNTER — Other Ambulatory Visit (INDEPENDENT_AMBULATORY_CARE_PROVIDER_SITE_OTHER): Payer: Self-pay | Admitting: Family Medicine

## 2017-07-22 DIAGNOSIS — Z8669 Personal history of other diseases of the nervous system and sense organs: Secondary | ICD-10-CM

## 2017-07-22 DIAGNOSIS — M357 Hypermobility syndrome: Secondary | ICD-10-CM

## 2017-07-22 DIAGNOSIS — M797 Fibromyalgia: Secondary | ICD-10-CM

## 2017-07-22 DIAGNOSIS — F5101 Primary insomnia: Secondary | ICD-10-CM

## 2017-07-22 MED ORDER — AMITRIPTYLINE HCL 10 MG PO TABS
10.0000 mg | ORAL_TABLET | Freq: Every evening | ORAL | 7 refills | Status: DC
Start: 2017-07-22 — End: 2017-11-24

## 2017-07-22 NOTE — Telephone Encounter (Signed)
Name, strength, directions of requested refill(s):  amitriptyline (ELAVIL) 10 MG tablet would like to make it a 90 day supply. Pt number [phone]    Pharmacy to send refill to or patient to pick up rx from office (mark requested pharmacy in BOLD):      [id number] [place] [place] [facility]  [address]  [address]  [place] [address]  Phone: [phone] Fax: [phone]        Please mark "X" next to the preferred call back number:    Mobile:   Telephone Information:   Mobile [phone]       Home: [phone]    Work: [phone]          Next visit: 09/24/2017

## 2017-07-23 ENCOUNTER — Telehealth (INDEPENDENT_AMBULATORY_CARE_PROVIDER_SITE_OTHER): Payer: Self-pay | Admitting: Family Medicine

## 2017-07-23 ENCOUNTER — Encounter (INDEPENDENT_AMBULATORY_CARE_PROVIDER_SITE_OTHER): Payer: Self-pay | Admitting: Family Medicine

## 2017-07-23 NOTE — Telephone Encounter (Signed)
Calling pt's ins., per pt's request, to follow up a PA for MRI cervical spine w/out contrast case #[id number]. Spoke w Laveda Abbe B-RN and she states PA is in process, will take 24 hrs to receive an answer. Nurse advise pt to reschedule her MRI appt that she has for tomorrow.

## 2017-07-23 NOTE — Telephone Encounter (Signed)
Pt is requesting for Dr. Alphonsa Gin to call the imaging center where she is having her MRI done. Pt mentioned something about the imaging center not submitting her forms on time and is requesting for pcp to call and states it is urgent. Pt states she was seen at the ER on Monday 11/5 and did not receive an MRI while there due to having an appt for this MRI for Friday 11/9. Pt would like to pcp to call to state it's urgent so that she can still have her MRI tomorrow, if not it will be delayed. Ph. [phone] option 4. Case Number is [id number]. Please advise

## 2017-07-23 NOTE — Telephone Encounter (Signed)
Pt returned call and inform she spoke w/insurance. Pt was told that office has to request to mark it urgent so she can get the MRI tomorrow (insurance informed office called them). Please call pt back. Thank you

## 2017-07-23 NOTE — Telephone Encounter (Signed)
Phone number not in service. Please call pt.

## 2017-07-23 NOTE — Telephone Encounter (Signed)
Patient is returning call.  °

## 2017-07-24 ENCOUNTER — Telehealth (INDEPENDENT_AMBULATORY_CARE_PROVIDER_SITE_OTHER): Payer: Self-pay

## 2017-07-24 NOTE — Telephone Encounter (Signed)
Calling pt's Neurology. They are aware that PA was denied and states that one of their nurses is writting a medical necessity letter for pt.

## 2017-07-28 ENCOUNTER — Encounter (INDEPENDENT_AMBULATORY_CARE_PROVIDER_SITE_OTHER): Payer: Self-pay | Admitting: Family Medicine

## 2017-07-29 ENCOUNTER — Other Ambulatory Visit (INDEPENDENT_AMBULATORY_CARE_PROVIDER_SITE_OTHER): Payer: Self-pay

## 2017-07-29 ENCOUNTER — Telehealth (INDEPENDENT_AMBULATORY_CARE_PROVIDER_SITE_OTHER): Payer: Self-pay

## 2017-07-29 ENCOUNTER — Encounter (INDEPENDENT_AMBULATORY_CARE_PROVIDER_SITE_OTHER): Payer: Self-pay | Admitting: Family Medicine

## 2017-07-29 DIAGNOSIS — G243 Spasmodic torticollis: Secondary | ICD-10-CM

## 2017-07-29 DIAGNOSIS — M542 Cervicalgia: Secondary | ICD-10-CM

## 2017-07-29 DIAGNOSIS — M357 Hypermobility syndrome: Secondary | ICD-10-CM

## 2017-07-29 NOTE — Telephone Encounter (Signed)
Pt's husband stopped by upset requesting his wife CT and MRI result. Mr Dray threatened and cursing our office and states that results have to be in MyChart by Noon today otherwise he will come back.

## 2017-07-30 ENCOUNTER — Encounter (INDEPENDENT_AMBULATORY_CARE_PROVIDER_SITE_OTHER): Payer: Self-pay

## 2017-08-17 ENCOUNTER — Other Ambulatory Visit (INDEPENDENT_AMBULATORY_CARE_PROVIDER_SITE_OTHER): Payer: Self-pay | Admitting: Family Medicine

## 2017-08-17 ENCOUNTER — Telehealth (INDEPENDENT_AMBULATORY_CARE_PROVIDER_SITE_OTHER): Payer: Self-pay

## 2017-08-17 DIAGNOSIS — F902 Attention-deficit hyperactivity disorder, combined type: Secondary | ICD-10-CM

## 2017-08-17 MED ORDER — BUPROPION HCL 75 MG PO TABS
ORAL_TABLET | ORAL | 4 refills | Status: DC
Start: 2017-08-17 — End: 2017-09-24

## 2017-08-17 NOTE — Telephone Encounter (Signed)
[facility] on Bupropion HCL 75 mg.

## 2017-08-19 ENCOUNTER — Other Ambulatory Visit (FREE_STANDING_LABORATORY_FACILITY): Payer: 59

## 2017-08-19 ENCOUNTER — Encounter (FREE_STANDING_LABORATORY_FACILITY): Payer: 59

## 2017-08-19 DIAGNOSIS — Z1379 Encounter for other screening for genetic and chromosomal anomalies: Secondary | ICD-10-CM

## 2017-08-19 DIAGNOSIS — C189 Malignant neoplasm of colon, unspecified: Secondary | ICD-10-CM

## 2017-08-19 LAB — CBC WITH MANUAL DIFFERENTIAL
Absolute NRBC: 0 10*3/uL
Atypical Lymphocytes %: 4 %
Atypical Lymphocytes Absolute: 0.26 10*3/uL — ABNORMAL HIGH
Band Neutrophils Absolute: 0 10*3/uL (ref 0.00–1.00)
Band Neutrophils: 0 %
Basophils Absolute Manual: 0.19 10*3/uL (ref 0.00–0.20)
Basophils Manual: 3 %
Cell Morphology: NORMAL
Eosinophils Absolute Manual: 0.06 10*3/uL (ref 0.00–0.70)
Eosinophils Manual: 1 %
Hematocrit: 40.7 % (ref 37.0–47.0)
Hgb: 12.3 g/dL (ref 12.0–16.0)
Lymphocytes Absolute Manual: 1.54 10*3/uL (ref 0.50–4.40)
Lymphocytes Manual: 24 %
MCH: 29.5 pg (ref 28.0–32.0)
MCHC: 30.2 g/dL — ABNORMAL LOW (ref 32.0–36.0)
MCV: 97.6 fL (ref 80.0–100.0)
MPV: 10.1 fL (ref 9.4–12.3)
Monocytes Absolute: 0.58 10*3/uL (ref 0.00–1.20)
Monocytes Manual: 9 %
Neutrophils Absolute Manual: 3.78 10*3/uL (ref 1.80–8.10)
Nucleated RBC: 0 /100 WBC (ref 0.0–1.0)
Platelet Estimate: NORMAL
Platelets: 338 10*3/uL (ref 140–400)
RBC: 4.17 10*6/uL — ABNORMAL LOW (ref 4.20–5.40)
RDW: 13 % (ref 12–15)
Segmented Neutrophils: 59 %
WBC: 6.41 10*3/uL (ref 3.50–10.80)

## 2017-08-19 LAB — CA 125: CA 125: 11.6 (ref 0.0–35.0)

## 2017-08-19 LAB — CEA: CEA: 2.1 ng/mL (ref 0.0–5.0)

## 2017-08-20 ENCOUNTER — Ambulatory Visit (FREE_STANDING_LABORATORY_FACILITY): Payer: Self-pay

## 2017-08-20 ENCOUNTER — Telehealth (INDEPENDENT_AMBULATORY_CARE_PROVIDER_SITE_OTHER): Payer: Self-pay | Admitting: Hematology & Oncology

## 2017-08-20 DIAGNOSIS — Z1379 Encounter for other screening for genetic and chromosomal anomalies: Secondary | ICD-10-CM

## 2017-08-20 NOTE — Progress Notes (Signed)
I met with this patient on 08/20/17. We discussed the test method and possible results, as well as the limitations and benefits of pharmacogenomic testing. Patient would like information on pain, psychiatric, and gastrointestinal medications.  Patient opted for Medimap Plus. Patient was instructed to share the results with her physicians. Consent was obtained, and sample collected. Patient was informed of 8 week turnaround time.

## 2017-08-20 NOTE — Telephone Encounter (Signed)
Can you let Teresa [name] know that her labs are essentially normal and I look forward to seeing her in January.

## 2017-08-21 NOTE — Telephone Encounter (Signed)
Patient called and informed that results were normal and that she will be seen in January. Results released on my chart.

## 2017-08-24 ENCOUNTER — Encounter (INDEPENDENT_AMBULATORY_CARE_PROVIDER_SITE_OTHER): Payer: Self-pay | Admitting: Hematology & Oncology

## 2017-08-25 ENCOUNTER — Other Ambulatory Visit (INDEPENDENT_AMBULATORY_CARE_PROVIDER_SITE_OTHER): Payer: Self-pay

## 2017-08-25 DIAGNOSIS — C189 Malignant neoplasm of colon, unspecified: Secondary | ICD-10-CM

## 2017-08-27 ENCOUNTER — Other Ambulatory Visit (INDEPENDENT_AMBULATORY_CARE_PROVIDER_SITE_OTHER): Payer: Self-pay

## 2017-08-27 DIAGNOSIS — C189 Malignant neoplasm of colon, unspecified: Secondary | ICD-10-CM

## 2017-08-28 LAB — MEDIMAP(R) PLUS PANEL 2.0 (SOFT)

## 2017-09-12 ENCOUNTER — Encounter (INDEPENDENT_AMBULATORY_CARE_PROVIDER_SITE_OTHER): Payer: Self-pay | Admitting: Family Medicine

## 2017-09-16 ENCOUNTER — Encounter (INDEPENDENT_AMBULATORY_CARE_PROVIDER_SITE_OTHER): Payer: Self-pay | Admitting: Family Medicine

## 2017-09-16 ENCOUNTER — Encounter (INDEPENDENT_AMBULATORY_CARE_PROVIDER_SITE_OTHER): Payer: Self-pay

## 2017-09-16 ENCOUNTER — Telehealth (INDEPENDENT_AMBULATORY_CARE_PROVIDER_SITE_OTHER): Payer: Self-pay

## 2017-09-16 NOTE — Telephone Encounter (Signed)
Pt's CT result is at front. w front ladies

## 2017-09-18 ENCOUNTER — Encounter (INDEPENDENT_AMBULATORY_CARE_PROVIDER_SITE_OTHER): Payer: Self-pay | Admitting: Hematology & Oncology

## 2017-09-18 ENCOUNTER — Ambulatory Visit (INDEPENDENT_AMBULATORY_CARE_PROVIDER_SITE_OTHER): Payer: 59 | Admitting: Hematology & Oncology

## 2017-09-18 VITALS — BP 104/71 | HR 96 | Temp 97.4°F | Wt 136.4 lb

## 2017-09-18 DIAGNOSIS — C189 Malignant neoplasm of colon, unspecified: Secondary | ICD-10-CM

## 2017-09-18 NOTE — Addendum Note (Signed)
Addended by: Marlaine Hind on: 09/18/2017 09:31 AM     Modules accepted: Orders

## 2017-09-18 NOTE — Progress Notes (Addendum)
Subjective:       Patient ID: Teresa [name] is a 47 y.o. female.    Interval Hx:  She has had cervical neck pain and is wearing a brace.  Her CEA and CA 125 are within normal limits. Iron profile has been profile.      HPI  47 year old female with a history of T3N0M0 colon cancer and a strong family history of colon cancer and basal cell cancer presents for follow up.  She was diagnosed with colon cancer in 2009 in [place]. 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 She had 0/34 positive lymph nodes. She received 12 cycles of adjuvant chemotherapy with 5-FU/Leucovorin.    Her microsatellite IHC was normal. Her father died of colon cancer at 36, and her only brother had polyps. She had an [facility] colon cancer panel that was reportedly normal other than a POLB Leu259Ser.    She is followed for iron deficiency anemia of unclear origin, that has resolved. She had taken prophylactic aspirin for a time, but discontinued this around 2014. She had a capsule endoscopy in 2014, that was normal ([facility]).    Her recurrence screening included Q2 year MRI for the first 5 years as she refused CT scans.    She also has a history of basal cell cancer x2. Her mother has a history of basal cell cancer as well. She has had benign uterine polyps.    She takes multiple supplements (scanned into EPIC) that include Curcumin, ish Oil, I-theanine, I-lysine, Probioatics, Digestive enzymes.    COLONOSCOPY:  Feb. 2016 with Dr. Theodora Blow    EGD  Feb 2016-gastric polyps-repeat suggested in 2018.    PMH:  Allergies/seasonal  ADHD  Depression  Colon cancer  Basal cell cancer        Review of Systems  General: No acute distress  HEENT: no thrush seen. No oropharyngeal masses.  No lymphadenopathy in neck appreciated.  Lungs clear to auscultation B/L.  Regular Rate and rhythm.  Abdomen soft, non-tender, non-distended.  No clubbing, cyanosis, or edema.  Neuro exam without focal  weakness.      Objective:    Physical Exam   BP 104/71   Pulse 96   Temp 97.4 F (36.3 C) (Oral)   Wt 61.9 kg (136 lb 6.4 oz)   BMI 22.70 kg/m   General: No acute distress  HEENT: no thrush seen. No oropharyngeal masses.  No lymphadenopathy in neck appreciated.  Lungs clear to auscultation B/L.  Regular Rate and rhythm.  Abdomen soft, non-tender, non-distended.  No clubbing, cyanosis, or edema.  Neuro exam without focal weakness.      Assessment:       46 year old female with a history of right sided T3N0 colon cancer in 2009, and with a strong family history. She tested negative for Lynch Syndrome. She has no evidence of disease at this time.      Plan:       Given the very strong family history and her early presentation, continue Lynch-like screening.  -CEA, CA 125 yearly. (last normal in December 2018)  -Colonoscopy Q2 years. She had a normal colonoscopy in 2018. Her next will be in the spring of 2020.  -Pelvic U/S and GYN exam yearly. This will be done next week (Dr. Dareen Piano group).  -Qyear follow up.    Mild iron deficiency anemia that seems controlled with a very low dose of PO iron (4  times weekly).   She says when she discontinues that iron, she has worsening anemia.  Capsule endoscopy has been done within 5 years and was normal ([facility]).  -I think intermittent ferritin, iron profile, and transferrin would be reasonable at this point.    -No indication for ongoing body MRI at this point (7+ years from diagnosis).      Cc: Dr. Kern Alberta  Dr. Hulen Luster

## 2017-09-24 ENCOUNTER — Encounter (INDEPENDENT_AMBULATORY_CARE_PROVIDER_SITE_OTHER): Payer: Self-pay | Admitting: Family Medicine

## 2017-09-24 ENCOUNTER — Encounter (INDEPENDENT_AMBULATORY_CARE_PROVIDER_SITE_OTHER): Payer: Self-pay

## 2017-09-24 ENCOUNTER — Ambulatory Visit (FREE_STANDING_LABORATORY_FACILITY): Payer: 59 | Admitting: Family Medicine

## 2017-09-24 VITALS — BP 119/78 | HR 109 | Temp 97.1°F | Wt 138.0 lb

## 2017-09-24 DIAGNOSIS — R739 Hyperglycemia, unspecified: Secondary | ICD-10-CM

## 2017-09-24 DIAGNOSIS — R002 Palpitations: Secondary | ICD-10-CM

## 2017-09-24 DIAGNOSIS — D508 Other iron deficiency anemias: Secondary | ICD-10-CM

## 2017-09-24 DIAGNOSIS — F902 Attention-deficit hyperactivity disorder, combined type: Secondary | ICD-10-CM

## 2017-09-24 DIAGNOSIS — Z5181 Encounter for therapeutic drug level monitoring: Secondary | ICD-10-CM

## 2017-09-24 DIAGNOSIS — M797 Fibromyalgia: Secondary | ICD-10-CM

## 2017-09-24 LAB — HEMOGLOBIN A1C
Average Estimated Glucose: 108.3 mg/dL
Hemoglobin A1C: 5.4
Hemoglobin A1C: 5.4 % (ref 4.6–5.9)

## 2017-09-24 LAB — CBC AND DIFFERENTIAL
Absolute NRBC: 0 10*3/uL
Basophils Absolute Automated: 0.06 10*3/uL (ref 0.00–0.20)
Basophils Automated: 0.9 %
Eosinophils Absolute Automated: 0.04 10*3/uL (ref 0.00–0.70)
Eosinophils Automated: 0.6 %
Hematocrit: 39.2 % (ref 37.0–47.0)
Hgb: 12 g/dL (ref 12.0–16.0)
Immature Granulocytes Absolute: 0.03 10*3/uL
Immature Granulocytes: 0.5 %
Lymphocytes Absolute Automated: 1.54 10*3/uL (ref 0.50–4.40)
Lymphocytes Automated: 24.1 %
MCH: 30.6 pg (ref 28.0–32.0)
MCHC: 30.6 g/dL — ABNORMAL LOW (ref 32.0–36.0)
MCV: 100 fL (ref 80.0–100.0)
MPV: 10.1 fL (ref 9.4–12.3)
Monocytes Absolute Automated: 0.63 10*3/uL (ref 0.00–1.20)
Monocytes: 9.9 %
Neutrophils Absolute: 4.09 10*3/uL (ref 1.80–8.10)
Neutrophils: 64 %
Nucleated RBC: 0 /100 WBC (ref 0.0–1.0)
Platelets: 334 10*3/uL (ref 140–400)
RBC: 3.92 10*6/uL — ABNORMAL LOW (ref 4.20–5.40)
RDW: 13 % (ref 12–15)
WBC: 6.39 10*3/uL (ref 3.50–10.80)

## 2017-09-24 LAB — HEMOLYSIS INDEX: Hemolysis Index: 6 (ref 0–18)

## 2017-09-24 LAB — IRON PROFILE
Iron Saturation: 16 % (ref 15–50)
Iron: 61 ug/dL (ref 40–145)
TIBC: 375 ug/dL (ref 265–497)
UIBC: 314 ug/dL (ref 126–382)

## 2017-09-24 LAB — FERRITIN: Ferritin: 36.28 ng/mL (ref 4.60–204.00)

## 2017-09-24 MED ORDER — METHYLPHENIDATE HCL 5 MG PO TABS
5.0000 mg | ORAL_TABLET | Freq: Four times a day (QID) | ORAL | 0 refills | Status: DC
Start: 2017-09-24 — End: 2018-03-04

## 2017-09-24 NOTE — Progress Notes (Signed)
Have you seen any specialists/other providers since your last visit with us?    Yes, Neurosurgeon     Arm preference verified?   Yes    The patient is due for nothing at this time, HM is up-to-date.

## 2017-09-24 NOTE — Progress Notes (Signed)
Subjective:      Patient ID: Teresa [name]  is a 47 y.o.  female.     Chief Complaint   Patient presents with   . Medication Refill   . EKG        HPI  Patient comes in today to review her symptoms.    Neck pain-seen 2 neurosurgeons.  Is working on additional imaging.  There is a suggestion of surgery but it is not clear yet that she needs it.  She is continuing to work with the pain management specialist.  She has been started on a very low-dose of amitriptyline, which is helped her pain in the past.  She stopped Wellbutrin about one week ago.  Her sleep has improved.    Palpitations-getting intermittent palpitations.  She says her heart rate will go up and down when she is monitoring it.  She does not have chest pain.    Attention deficit/hyperactivity disorder-doing very well on a small dose of Ritalin 4 times a day.  She is aware that that could be affecting her palpitations but does not want to stop it.  She is willing to see the cardiologist.     History of iron deficiency anemia-continuing to take her iron.no blood in stool.  Has follow-up with gastroenterology coming up soon.    Past Medical History:   Diagnosis Date   . ADHD (attention deficit hyperactivity disorder)    . Anemia     constant   . Anxiety    . Arrhythmia     pt states she belives anemia led to heart flutters in the past.   . Asthma    . Depression    . Dysphagia    . Eczema    . Fibromyalgia    . Gastroesophageal reflux disease    . Headache     migraine, sensitivity to light   . Heart burn    . Hypercholesterolemia 12/24/2015   . Malignant neoplasm of colon 2009    chemo   . Malignant neoplasm of skin 2007    basal   . Neck pain    . Osteoporosis, idiopathic     Not sure.  Have had osteopenia in past but not now.   . Post-operative nausea and vomiting    . Psoriasis    . Seasonal allergic rhinitis    . Sleep apnea     does not use CPAP       Review of Systems    no fevers, chills, chest pain, shortness of breath.  Does occasionally  get dizzy with standing.      Objective:     BP 119/78 (BP Site: Left arm, Patient Position: Sitting, Cuff Size: Medium)   Pulse (!) 109   Temp 97.1 F (36.2 C) (Oral)   Wt 62.6 kg (138 lb)   SpO2 98%   BMI 22.96 kg/m     Physical Exam   Neck:   Wearing a neck brace   Cardiovascular: Normal rate and regular rhythm.    Pulmonary/Chest: Effort normal.   Psychiatric: She has a normal mood and affect.   EKG: normal sinus rhythm, nonspecific ST and T waves changes, normal QT interval.    Vital signs reviewed     Assessment and Plan:     1. Iron deficiency anemia secondary to inadequate dietary iron intake  Stable.  Recheck level today.  - IRON PROFILE  - Ferritin  - CBC and differential    2. Encounter for  therapeutic drug monitoring  EKG requested by pain management doctor.  Normal QT interval today.  - ECG 12 lead    3. Attention deficit hyperactivity disorder (ADHD), combined type  Refill given.  Discussed risks versus benefits given her palpitations.  She would prefer to continue it.  - methylphenidate (RITALIN) 5 MG tablet; Take 1 tablet (5 mg total) by mouth 4 (four) times daily.  Dispense: 120 tablet; Refill: 0  - methylphenidate (RITALIN) 5 MG tablet; Take 1 tablet (5 mg total) by mouth 4 (four) times daily.Fill at least 30 days from date on rx  Dispense: 120 tablet; Refill: 0  - methylphenidate (RITALIN) 5 MG tablet; Take 1 tablet (5 mg total) by mouth 4 (four) times daily.Fill at least 60 days from date on rx  Dispense: 120 tablet; Refill: 0    4. Hyperglycemia  She requested recheck as she has changed her diet to help with mast cell problems.  - Hemoglobin A1C    5. Fibromyalgia  No real improvement.  Continuing to work on medication changes.    6. Palpitations  Referral to cardiology for further evaluation.  - Ambulatory referral to Cardiology    Discussed red flags to return for reevaluation.  Risk & Benefits of any previous or new medication(s) were explained to the patient who verbalized  understanding & agreed to the treatment plan.     Call with updates/questions/concerns.    Ellery Plunk, M.D.

## 2017-09-25 ENCOUNTER — Ambulatory Visit
Admission: RE | Admit: 2017-09-25 | Discharge: 2017-09-25 | Disposition: A | Discharge status: Home / Self Care | Payer: 59 | Source: Ambulatory Visit | Attending: Hematology & Oncology | Admitting: Hematology & Oncology

## 2017-09-25 ENCOUNTER — Telehealth (INDEPENDENT_AMBULATORY_CARE_PROVIDER_SITE_OTHER): Payer: Self-pay | Admitting: Hematology & Oncology

## 2017-09-25 ENCOUNTER — Encounter (INDEPENDENT_AMBULATORY_CARE_PROVIDER_SITE_OTHER): Payer: Self-pay

## 2017-09-25 ENCOUNTER — Ambulatory Visit (INDEPENDENT_AMBULATORY_CARE_PROVIDER_SITE_OTHER): Payer: 59 | Admitting: Family Medicine

## 2017-09-25 ENCOUNTER — Encounter (INDEPENDENT_AMBULATORY_CARE_PROVIDER_SITE_OTHER): Payer: Self-pay | Admitting: Hematology & Oncology

## 2017-09-25 ENCOUNTER — Other Ambulatory Visit (INDEPENDENT_AMBULATORY_CARE_PROVIDER_SITE_OTHER): Payer: Self-pay | Admitting: Hematology & Oncology

## 2017-09-25 DIAGNOSIS — C189 Malignant neoplasm of colon, unspecified: Secondary | ICD-10-CM

## 2017-09-25 DIAGNOSIS — D251 Intramural leiomyoma of uterus: Secondary | ICD-10-CM | POA: Insufficient documentation

## 2017-09-25 NOTE — Telephone Encounter (Signed)
Called and spoke with patient who verbalized understanding.  

## 2017-09-25 NOTE — Telephone Encounter (Signed)
U/S shows no evidence of cancer.  There was one fibroid.

## 2017-09-29 DIAGNOSIS — S0300XA Dislocation of jaw, unspecified side, initial encounter: Secondary | ICD-10-CM | POA: Insufficient documentation

## 2017-10-02 ENCOUNTER — Encounter (INDEPENDENT_AMBULATORY_CARE_PROVIDER_SITE_OTHER): Payer: Self-pay

## 2017-10-07 DIAGNOSIS — R7401 Elevation of levels of liver transaminase levels: Secondary | ICD-10-CM | POA: Insufficient documentation

## 2017-10-09 ENCOUNTER — Encounter (INDEPENDENT_AMBULATORY_CARE_PROVIDER_SITE_OTHER): Payer: Self-pay | Admitting: Family Medicine

## 2017-10-09 NOTE — Telephone Encounter (Signed)
Can you look into this again?

## 2017-10-12 ENCOUNTER — Other Ambulatory Visit: Payer: Self-pay | Admitting: Gastroenterology

## 2017-10-12 ENCOUNTER — Telehealth (INDEPENDENT_AMBULATORY_CARE_PROVIDER_SITE_OTHER): Payer: Self-pay | Admitting: Family Medicine

## 2017-10-12 NOTE — Telephone Encounter (Signed)
Called and spoken with patient regarding PA for Pharmacogenomics test  Case # [id number] results is pending . Patient aware notes from Dr. Hulen Luster ,CPT [id number], ICD code Z13.79 has been faxed  To [phone].

## 2017-10-12 NOTE — Telephone Encounter (Signed)
Notes in Encounter by Tammy.

## 2017-10-14 DIAGNOSIS — K449 Diaphragmatic hernia without obstruction or gangrene: Secondary | ICD-10-CM | POA: Insufficient documentation

## 2017-10-16 ENCOUNTER — Encounter (INDEPENDENT_AMBULATORY_CARE_PROVIDER_SITE_OTHER): Payer: Self-pay

## 2017-10-16 NOTE — Telephone Encounter (Signed)
Received fax from [facility] healthcare. Coverage declined.

## 2017-10-19 ENCOUNTER — Ambulatory Visit: Admission: RE | Admit: 2017-10-19 | Payer: 59 | Source: Ambulatory Visit

## 2017-10-22 ENCOUNTER — Encounter (INDEPENDENT_AMBULATORY_CARE_PROVIDER_SITE_OTHER): Payer: Self-pay

## 2017-11-02 ENCOUNTER — Encounter (INDEPENDENT_AMBULATORY_CARE_PROVIDER_SITE_OTHER): Payer: Self-pay | Admitting: Family Medicine

## 2017-11-03 ENCOUNTER — Encounter (INDEPENDENT_AMBULATORY_CARE_PROVIDER_SITE_OTHER): Payer: Self-pay

## 2017-11-03 ENCOUNTER — Other Ambulatory Visit (INDEPENDENT_AMBULATORY_CARE_PROVIDER_SITE_OTHER): Payer: Self-pay | Admitting: Family Medicine

## 2017-11-03 ENCOUNTER — Encounter (INDEPENDENT_AMBULATORY_CARE_PROVIDER_SITE_OTHER): Payer: Self-pay | Admitting: Family Medicine

## 2017-11-03 DIAGNOSIS — M357 Hypermobility syndrome: Secondary | ICD-10-CM

## 2017-11-03 DIAGNOSIS — Q796 Ehlers-Danlos syndrome, unspecified: Secondary | ICD-10-CM

## 2017-11-03 NOTE — Telephone Encounter (Signed)
Left script at the front for patient to pick up.

## 2017-11-05 NOTE — Telephone Encounter (Signed)
Please put the order for Prevnar, pt has a prescription from other provider. Please read the previous message.    Thank you.

## 2017-11-08 ENCOUNTER — Encounter (INDEPENDENT_AMBULATORY_CARE_PROVIDER_SITE_OTHER): Payer: Self-pay | Admitting: Family Medicine

## 2017-11-09 NOTE — Telephone Encounter (Signed)
Updated in chart

## 2017-11-11 ENCOUNTER — Ambulatory Visit (INDEPENDENT_AMBULATORY_CARE_PROVIDER_SITE_OTHER): Payer: 59

## 2017-11-12 ENCOUNTER — Ambulatory Visit (INDEPENDENT_AMBULATORY_CARE_PROVIDER_SITE_OTHER): Payer: 59

## 2017-11-12 ENCOUNTER — Other Ambulatory Visit (INDEPENDENT_AMBULATORY_CARE_PROVIDER_SITE_OTHER): Payer: 59 | Admitting: Family Medicine

## 2017-11-12 DIAGNOSIS — R0789 Other chest pain: Secondary | ICD-10-CM

## 2017-11-14 ENCOUNTER — Encounter (INDEPENDENT_AMBULATORY_CARE_PROVIDER_SITE_OTHER): Payer: Self-pay | Admitting: Family Medicine

## 2017-11-20 ENCOUNTER — Ambulatory Visit: Payer: 59

## 2017-11-23 ENCOUNTER — Encounter (INDEPENDENT_AMBULATORY_CARE_PROVIDER_SITE_OTHER): Payer: Self-pay | Admitting: Family Medicine

## 2017-11-23 NOTE — Telephone Encounter (Signed)
Should I refer her to medical records or can we print them?

## 2017-11-24 ENCOUNTER — Ambulatory Visit (INDEPENDENT_AMBULATORY_CARE_PROVIDER_SITE_OTHER): Payer: 59 | Admitting: Family Medicine

## 2017-11-24 ENCOUNTER — Encounter (INDEPENDENT_AMBULATORY_CARE_PROVIDER_SITE_OTHER): Payer: Self-pay | Admitting: Family Medicine

## 2017-11-24 VITALS — BP 140/81 | HR 94 | Temp 98.0°F | Resp 17 | Ht 61.35 in | Wt 135.0 lb

## 2017-11-24 DIAGNOSIS — Q796 Ehlers-Danlos syndrome: Secondary | ICD-10-CM

## 2017-11-24 DIAGNOSIS — Q7962 Hypermobile Ehlers-Danlos syndrome: Secondary | ICD-10-CM

## 2017-11-24 DIAGNOSIS — G2581 Restless legs syndrome: Secondary | ICD-10-CM

## 2017-11-24 MED ORDER — DOXEPIN HCL 6 MG PO TABS
1.0000 | ORAL_TABLET | Freq: Every evening | ORAL | Status: DC
Start: 2017-11-24 — End: 2017-12-09

## 2017-11-24 NOTE — Patient Instructions (Signed)
Keep appointments with your specialists for your various conditions.  Return in a month or so for care coordination.

## 2017-11-24 NOTE — Progress Notes (Signed)
Subjective:      Patient ID: Teresa [name] is a 47 y.o. female     Chief Complaint   Patient presents with   . Individual Consultation        HPI   Patient is here with updates on her various medical conditions.  She was diagnosed with hypermobility-Ehlers-Danlos syndrome.  Present with husband.    Pt with hypermobility of the jaw on flouroscopic Xray. She was sent to prosthrodontics-craniofacial pain-sleep disorder dr - Dr. Nickolas Madrid. Given CBD cream 3-5x/day. She is not using this. As she is a Manufacturing engineer. Dr. Doug Sou wiould rx it.     Pt will have MRI order from dentist and will be seein [facility] dentistry. Pt states there is damage to jaw socket and states "jaw hitting the skull" causing pain and neuralgia along Trigeminal nerve. There is a misshapen bone as well.     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     She will get an occipital nerve block per pain mgmt.     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 Job may not allow her to do this. Thinking about ?FMLA in the future.     Pt with insomnia. She cut back on ritalin to 3x/day on weekdays and 2x/weekends. She has stopped med in past for cardiology appts and it was ok. She may want to stop med completely at some point.     She has dystonia, tremor, RLS. Ferritin is low. She is seeing a hematologist in June. Hgb is borderline low. She has not had a period in years. She had infusions in the past which helped.     30 minutes spent with the patient, of which over half was spent in discussion regarding her various medical conditions.    The following sections were reviewed this encounter by the provider:   Allergies  Meds  Problems         Review of Systems       BP 140/81   Pulse 94   Temp 98 F (36.7 C) (Oral)   Resp 17   Ht 1.558 m (5' 1.35")   Wt 61.2 kg (135 lb)   SpO2 98%   BMI 25.22 kg/m      Objective:     Physical Exam   Constitutional: She is  oriented to person, place, and time. She appears well-developed and well-nourished.   HENT:   Cervical collar   Neurological: She is alert and oriented to person, place, and time.   Psychiatric: She has a normal mood and affect. Her behavior is normal.   Nursing note and vitals reviewed.      Assessment:     1. Ehlers-Danlos, hypermobile type    2. Restless leg syndrome        Plan:     Patient Instructions   Keep appointments with your specialists for your various conditions.  Return in a month or so for care coordination.      [name], MD

## 2017-11-25 ENCOUNTER — Telehealth (INDEPENDENT_AMBULATORY_CARE_PROVIDER_SITE_OTHER): Payer: Self-pay | Admitting: Family Medicine

## 2017-11-25 ENCOUNTER — Encounter (INDEPENDENT_AMBULATORY_CARE_PROVIDER_SITE_OTHER): Payer: Self-pay | Admitting: Family Medicine

## 2017-11-25 NOTE — Telephone Encounter (Signed)
Received on husband's mychart:    Follow-up to 3/12 [facility] visit.     I can't send you a message through [name]'s account. This is in regards to her visit yesterday. Teresa [name], 1970-12-24.     [name] tried the silenor (doxepin) for sleep last night. She took 1/2 of a 3mg  pill. Later she took the 2nd half. We think it did help her sleep a little better after she took the 2nd half. However, she had severe nightmares. She also was unusually restless and I think a few times "thrashing" is an appropriate description of her movement. I don't know whether she will use this again. It seems that the dreams are a known side-effect of silenor. It's not clear to me about the movement - the drug data suggests it should potentially suppress movement, but EDS patients can have odd drug interactions, so who knows.     She was given a prescription of Methocarbamol by the dentist in [place]. 750mg  pills. She has taken half a pill before bed for a week. It's not clear to me what this is doing. However, the drug interactions for silenor list "muscle relaxants" as potential interactions. I am going to ask our pharmacist about this.     We discussed an iron infusion for [name] yesterday. She has had two previously - during chemotherapy in October 2009, and again sometime in 2011. Both administered by Wyatt Haste (her Oncologist), I think. Both times it helped tremendously with her sleep and also in relieving her general level of fatigue. Her hematology appointment is not until June. Is there a way we could potentially get her an iron infusion sooner?     Her general fatigue is the thing that impacts her work the most. If we could get some relief there, that would give [place] time to solve some of the other problems she is having. We are investigating moving closer to her workplace to cut her commute time. It will be several months, at best, before we could get that to happen.     I will bring a copy of the report from  [name]'s sleep study in 2011. I also am bringing more recent lab results from the Cadiologist. I don't think they show anything substantially different from recent work done in your office.     Thanks,     Teola Bradley

## 2017-11-26 ENCOUNTER — Encounter (INDEPENDENT_AMBULATORY_CARE_PROVIDER_SITE_OTHER): Payer: Self-pay

## 2017-11-26 ENCOUNTER — Telehealth (INDEPENDENT_AMBULATORY_CARE_PROVIDER_SITE_OTHER): Payer: Self-pay | Admitting: Family Medicine

## 2017-11-26 ENCOUNTER — Encounter (INDEPENDENT_AMBULATORY_CARE_PROVIDER_SITE_OTHER): Payer: Self-pay | Admitting: Family Medicine

## 2017-11-26 NOTE — Telephone Encounter (Signed)
Received following on husband's mychart:    I promise this is the last one today. I want to give a short list of EDS related articles that might be interesting.     [email or web address]     [email or web address]-[email or web address]-ehlers-[email or web address]-[email or web address]     The first one is a general overview of EDS. The second one is a discussion of jaw issues in EDS. Both of them have links at the bottom to the journal articles they are based on. I imagine you are more interested in the journal articles.     Teresa [name] is the real expert on these TMJ and EDS issues. He is the coauthor of the 2nd paper above.  An interesting conference presentation where he discusses these issues is included below. This is more or less the ballpark of what we think is going on with Teresa [name].     [email or web address]     Another interesting (and short!) paper is this paper from 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 [name] says that [name] has probably seen more EDS patients than any other clinician. Teresa [name] has many symptoms that sound like these dystonias. [name], who works part-time in [name] practice and part-time elsewhere (we aren't sure where) is possibly the expert on this. [name] suggested Teresa [name] see [name], and she may in the future.     [email or web address]     Last, I want to mention our [facility] experience. Originally we were supposed to go to [facility] first and then to [name] a few days later. I think that would have worked out better. But [name] had a cancellation and we ended up their first. My research/reading leads me to think Dr.  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 Unfortunately, [name] was his professor/mentor in medical school at [employer] and the main reason [name] chose Neurosurgery in the first place. [name] was mostly interested in what [name] thought rather than giving Teresa [name] a thorough examination. I do think it we did a useful review of the case and it was beneficial as a 2nd opinion, but I don't think we got the fresh set of eyes we hoped for. I didn't discover the relationship between [name] and [name] until after we had the first appointment with [name]. It   turned out not to be very important. 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 Hopefully something useful will come from that when Teresa [name] gets results in a few weeks.

## 2017-11-27 ENCOUNTER — Encounter (INDEPENDENT_AMBULATORY_CARE_PROVIDER_SITE_OTHER): Payer: Self-pay | Admitting: Family Medicine

## 2017-11-30 ENCOUNTER — Encounter (INDEPENDENT_AMBULATORY_CARE_PROVIDER_SITE_OTHER): Payer: Self-pay

## 2017-11-30 NOTE — Progress Notes (Signed)
Updating pt's med list after receiving new list from pt.

## 2017-12-03 ENCOUNTER — Other Ambulatory Visit (INDEPENDENT_AMBULATORY_CARE_PROVIDER_SITE_OTHER): Payer: Self-pay | Admitting: Family Medicine

## 2017-12-03 ENCOUNTER — Encounter (INDEPENDENT_AMBULATORY_CARE_PROVIDER_SITE_OTHER): Payer: Self-pay | Admitting: Family Medicine

## 2017-12-03 DIAGNOSIS — G2581 Restless legs syndrome: Secondary | ICD-10-CM

## 2017-12-03 DIAGNOSIS — R790 Abnormal level of blood mineral: Secondary | ICD-10-CM

## 2017-12-03 MED ORDER — FERROUS SULFATE 300 (60 FE) MG/5ML PO SYRP
300.00 mg | ORAL_SOLUTION | Freq: Every day | ORAL | 1 refills | Status: DC
Start: 2017-12-03 — End: 2017-12-04

## 2017-12-04 ENCOUNTER — Other Ambulatory Visit (INDEPENDENT_AMBULATORY_CARE_PROVIDER_SITE_OTHER): Payer: Self-pay | Admitting: Family Medicine

## 2017-12-04 ENCOUNTER — Encounter (INDEPENDENT_AMBULATORY_CARE_PROVIDER_SITE_OTHER): Payer: Self-pay | Admitting: Family Medicine

## 2017-12-04 DIAGNOSIS — G2581 Restless legs syndrome: Secondary | ICD-10-CM

## 2017-12-04 DIAGNOSIS — R790 Abnormal level of blood mineral: Secondary | ICD-10-CM

## 2017-12-04 MED ORDER — FERROUS SULFATE 220 (44 FE) MG/5ML PO ELIX
324.00 mg | ORAL_SOLUTION | Freq: Every day | ORAL | 1 refills | Status: DC
Start: 2017-12-04 — End: 2018-03-04

## 2017-12-06 ENCOUNTER — Other Ambulatory Visit (INDEPENDENT_AMBULATORY_CARE_PROVIDER_SITE_OTHER): Payer: Self-pay | Admitting: Family Medicine

## 2017-12-08 ENCOUNTER — Encounter (INDEPENDENT_AMBULATORY_CARE_PROVIDER_SITE_OTHER): Payer: Self-pay | Admitting: Family Medicine

## 2017-12-09 ENCOUNTER — Ambulatory Visit (INDEPENDENT_AMBULATORY_CARE_PROVIDER_SITE_OTHER): Payer: Self-pay | Admitting: Family Medicine

## 2017-12-09 ENCOUNTER — Encounter (INDEPENDENT_AMBULATORY_CARE_PROVIDER_SITE_OTHER): Payer: Self-pay | Admitting: Family Medicine

## 2017-12-09 ENCOUNTER — Ambulatory Visit (INDEPENDENT_AMBULATORY_CARE_PROVIDER_SITE_OTHER): Payer: 59 | Admitting: Family Medicine

## 2017-12-09 VITALS — BP 128/78 | HR 86 | Temp 98.2°F | Resp 17 | Ht 62.0 in | Wt 138.0 lb

## 2017-12-09 DIAGNOSIS — Q7962 Hypermobile Ehlers-Danlos syndrome: Secondary | ICD-10-CM

## 2017-12-09 DIAGNOSIS — M542 Cervicalgia: Secondary | ICD-10-CM

## 2017-12-09 DIAGNOSIS — Q796 Ehlers-Danlos syndrome: Secondary | ICD-10-CM

## 2017-12-09 DIAGNOSIS — M797 Fibromyalgia: Secondary | ICD-10-CM

## 2017-12-09 NOTE — Patient Instructions (Signed)
I will fill out your forms/letters.  Keep your specialist appointments.

## 2017-12-09 NOTE — Progress Notes (Signed)
Subjective:      Patient ID: Teresa [name] is a 47 y.o. female     Chief Complaint   Patient presents with   . Letter for School/Work     update on working paper        HPI   Patient presents with several updates, as well as forms regarding leave from work.    She has ongoing shoulder pain, which she will discuss at another visit.  She saw dentist and a chiropractor who stated that she may have complex regional pain syndrome versus reflex sympathetic dystrophy.    She will start using a dental appliance due to the hypermobility of her jaw.  Her dentist told her that she should achieve her best results in about 3 months.  However, she may experience compensatory changes/cascading issues from the realignment of her jaw.  She may experience further pain in her neck or back.    She has difficulty with traveling secondary to neck pain.  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  Her employer is unable to extend her teleworking privileges.  She comes with some information regarding her position, and her limitations.  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  She is mentally coming to terms with stopping working.    She is starting to use the liquid iron.  Affect remains to be seen.  She will see hematology on June 5.  She is unable to get an infusion earlier than that.  We may check a ferritin prior to the hematology appointment.    The geneticist noticed something on her back that she would like looked at.    30 min spent with patient in discussion of the above.     The following sections were reviewed this encounter by the provider:   Allergies  Meds  Problems         Review of Systems       BP 128/78   Pulse 86   Temp 98.2 F (36.8 C) (Oral)   Resp 17   Ht 1.575 m (5\' 2" )   Wt 62.6 kg (138 lb)   SpO2 99%   BMI 25.24 kg/m      Objective:     Physical Exam   Constitutional: She appears  well-developed and well-nourished.   Musculoskeletal:   Mild fat pad at base of neck.   Skin:   No stretch marks over legs or abdomen.  Mild over right breast. Declined chaperone.    Nursing note and vitals reviewed.      Assessment:     1. Ehlers-Danlos, hypermobile type    2. Fibromyalgia    3. Cervicalgia        Plan:     Patient Instructions   I will fill out your forms/letters.  Keep your specialist appointments.      [name], MD

## 2017-12-09 NOTE — Progress Notes (Signed)
The patient was seen for medication reconciliation and polypharmacy by the pharmacist.  All medications were reviewed.  During the interview, the following points were noted.    There were several medication updates.  See below.    Current Outpatient Prescriptions   Medication Sig   . acetaminophen (TYLENOL) 500 MG tablet Take 500 mg by mouth every 4 (four) hours as needed.Takes rarely due to liver enzymes       . ascorbic acid (VITAMIN C) 500 MG tablet Take 625 mg by mouth daily.Using powder formulation   . Boswellia Serrata Extract Powder Take 150 mg by mouth daily.       . Calcium Citrate 200 MG Tab Take 400 mg by mouth 2 (two) times daily.Powder formulation       . diphenhydrAMINE (BENADRYL) 12.5 MG chewable tablet Chew 12.5 mg by mouth 4 (four) times daily as needed.   Marland Kitchen estradiol (ESTRACE) 0.1 MG/GM vaginal cream Place 1 g vaginally twice a week.   . famotidine (PEPCID) 20 MG tablet Take 20 mg by mouth 2 (two) times daily.   . ferrous sulfate 220 (44 Fe) MG/5ML solution Take 7.4 mLs (324 mg total) by mouth daily.   Marland Kitchen guaifenesin (ROBITUSSIN) 100 MG/5ML liquid Take 200 mg by mouth 4 (four) times daily as needed for Congestion.   Marland Kitchen L-Theanine 100 MG Cap Take 200 mg by mouth daily.       Marland Kitchen levocetirizine (XYZAL) 5 MG tablet Take 5 mg by mouth every evening.   Sherlynn Stalls Mineral Oil-Mineral Oil (RETAINE MGD) 0.5-0.5 % Emulsion Apply 1 drop to eye nightly.   Marland Kitchen MAGNESIUM CHLORIDE PO Take 150 mg by mouth nightly.   . Melatonin 1 MG/ML Liquid Take 0.5 drops by mouth nightly.   . methylphenidate (RITALIN) 5 MG tablet Take 1 tablet (5 mg total) by mouth 4 (four) times daily. (Patient taking differently: Take 5 mg by mouth.Takes 1 tablet by mouth TID on weekdays and BID on weekends    )   . montelukast (SINGULAIR) 4 MG Packet TAKE 2 PACKETS (8 MG TOTAL) BY MOUTH NIGHTLY. (Patient taking differently: Take 8 mg by mouth every morning.    )   . Omega-3 Fatty Acids (OMEGA-3 FISH OIL) 500 MG Cap Take 1,280 mg by mouth daily.        Marland Kitchen PREVIDENT 5000 BOOSTER PLUS 1.1 % Paste APPLY TO TEETH AFTER REGULAR BRUSHING AND FLOSSING   . QVAR REDIHALER 80 MCG/ACT inhaler Take 1 puff by mouth daily.   Marland Kitchen triamcinolone acetonide (NASACORT ALLERGY 24HR) 55 MCG/ACT nasal inhaler 1 spray by Nasal route daily.       . Turmeric Curcumin 500 MG Cap Take 1 capsule by mouth daily.       Marland Kitchen albuterol (PROVENTIL HFA;VENTOLIN HFA) 108 (90 Base) MCG/ACT inhaler Inhale 2 puffs into the lungs every 4 (four) hours as needed for Wheezing.   . betamethasone, augmented, (DIPROLENE) 0.05 % cream APPLY TWICE DAILY AS NEEDED TO THE AFFECTED AREAS FOR 5-7 DAYS FOR ECZEMA.   Marland Kitchen L-Lysine 1000 MG Tab Take 1,300 mg by mouth daily.      . methylphenidate (RITALIN) 5 MG tablet Take 1 tablet (5 mg total) by mouth 4 (four) times daily.Fill at least 30 days from date on rx   . methylphenidate (RITALIN) 5 MG tablet Take 1 tablet (5 mg total) by mouth 4 (four) times daily.Fill at least 60 days from date on rx       Patient Reported Medication Changes  -  Updated APAP sig  - D/c Alvesco  - Added QVAR  - D/c biotin  - Updated betamethasone sig  - Updated boswelia serrat  - Added egg sensitivity  - Aded lanolin sensitivity  - D/c refresh plus  - Added retaine MGD  - updated benadryl sig and dose  - d/c silnor  - changed guaifenesin to liquid  - updated theanine sig  - Updated methocarbamol SIG  - Updated Ritalin sig TID on weekdays, BID on weekends  - Updated montelukast sig  - d/c mvi  - updated nasacort sig  - updated turmeric sig  - d/c ubiquinol  - Added Sudafed    Clarene Critchley, RPh, PharmD, BCGP  Ambulatory Care Clinical Pharmacist

## 2017-12-11 ENCOUNTER — Encounter (INDEPENDENT_AMBULATORY_CARE_PROVIDER_SITE_OTHER): Payer: Self-pay | Admitting: Family Medicine

## 2017-12-11 ENCOUNTER — Ambulatory Visit (INDEPENDENT_AMBULATORY_CARE_PROVIDER_SITE_OTHER): Payer: Self-pay | Admitting: Family Medicine

## 2017-12-13 ENCOUNTER — Encounter (INDEPENDENT_AMBULATORY_CARE_PROVIDER_SITE_OTHER): Payer: Self-pay | Admitting: Family Medicine

## 2017-12-15 ENCOUNTER — Encounter (INDEPENDENT_AMBULATORY_CARE_PROVIDER_SITE_OTHER): Payer: Self-pay | Admitting: Family Medicine

## 2017-12-15 ENCOUNTER — Ambulatory Visit: Payer: 59

## 2017-12-17 ENCOUNTER — Encounter (INDEPENDENT_AMBULATORY_CARE_PROVIDER_SITE_OTHER): Payer: Self-pay

## 2017-12-17 ENCOUNTER — Ambulatory Visit (INDEPENDENT_AMBULATORY_CARE_PROVIDER_SITE_OTHER): Payer: 59 | Admitting: Family Medicine

## 2017-12-18 NOTE — Progress Notes (Signed)
Medication updates

## 2017-12-24 ENCOUNTER — Encounter (INDEPENDENT_AMBULATORY_CARE_PROVIDER_SITE_OTHER): Payer: Self-pay

## 2017-12-25 ENCOUNTER — Encounter (INDEPENDENT_AMBULATORY_CARE_PROVIDER_SITE_OTHER): Payer: Self-pay | Admitting: Family Medicine

## 2017-12-25 ENCOUNTER — Ambulatory Visit (INDEPENDENT_AMBULATORY_CARE_PROVIDER_SITE_OTHER): Payer: 59 | Admitting: Family Medicine

## 2017-12-25 ENCOUNTER — Encounter (INDEPENDENT_AMBULATORY_CARE_PROVIDER_SITE_OTHER): Payer: Self-pay

## 2017-12-25 VITALS — BP 117/78 | HR 82 | Temp 98.3°F | Wt 138.0 lb

## 2017-12-25 DIAGNOSIS — R6884 Jaw pain: Secondary | ICD-10-CM | POA: Insufficient documentation

## 2017-12-25 DIAGNOSIS — G8929 Other chronic pain: Secondary | ICD-10-CM | POA: Insufficient documentation

## 2017-12-25 DIAGNOSIS — G518 Other disorders of facial nerve: Secondary | ICD-10-CM | POA: Insufficient documentation

## 2017-12-25 DIAGNOSIS — R110 Nausea: Secondary | ICD-10-CM | POA: Insufficient documentation

## 2017-12-25 DIAGNOSIS — M5481 Occipital neuralgia: Secondary | ICD-10-CM | POA: Insufficient documentation

## 2017-12-25 DIAGNOSIS — R059 Cough, unspecified: Secondary | ICD-10-CM

## 2017-12-25 DIAGNOSIS — M2669 Other specified disorders of temporomandibular joint: Secondary | ICD-10-CM | POA: Insufficient documentation

## 2017-12-25 DIAGNOSIS — K589 Irritable bowel syndrome without diarrhea: Secondary | ICD-10-CM | POA: Insufficient documentation

## 2017-12-25 DIAGNOSIS — R42 Dizziness and giddiness: Secondary | ICD-10-CM | POA: Insufficient documentation

## 2017-12-25 DIAGNOSIS — G901 Familial dysautonomia [Riley-Day]: Secondary | ICD-10-CM | POA: Insufficient documentation

## 2017-12-25 DIAGNOSIS — R05 Cough: Secondary | ICD-10-CM

## 2017-12-25 DIAGNOSIS — R519 Headache, unspecified: Secondary | ICD-10-CM | POA: Insufficient documentation

## 2017-12-25 DIAGNOSIS — M2653 Deviation in opening and closing of the mandible: Secondary | ICD-10-CM | POA: Insufficient documentation

## 2017-12-25 DIAGNOSIS — R292 Abnormal reflex: Secondary | ICD-10-CM | POA: Insufficient documentation

## 2017-12-25 DIAGNOSIS — M25312 Other instability, left shoulder: Secondary | ICD-10-CM | POA: Insufficient documentation

## 2017-12-25 DIAGNOSIS — G500 Trigeminal neuralgia: Secondary | ICD-10-CM | POA: Insufficient documentation

## 2017-12-25 MED ORDER — BENZONATATE 200 MG PO CAPS
200.00 mg | ORAL_CAPSULE | Freq: Three times a day (TID) | ORAL | 0 refills | Status: DC | PRN
Start: 2017-12-25 — End: 2018-01-12

## 2017-12-25 NOTE — Progress Notes (Signed)
Subjective:      Patient ID: Teresa [name] is a 47 y.o. female     No chief complaint on file.   Cc: multiple c/o    HPI   We reviewed patient's accommodation letter.  Patient has added some information which she would like to have incorporated into the letter.  We updated patient's problem list.    She feels the ferritin has been helping.  We will get blood work for 6 weeks after starting this.    She comes in with a list of blood work ordered by her allergist.  We confirmed diagnosis codes with the provider over the phone so patient can have them drawn here.    She has noticed a cough and hoarseness over the last few days.  No fever.  No shortness of breath.  Tessalon helped in the past.  She does not tolerate over-the-counter medication well.    25 min spent with pt of which over half spent in discussion.    The following sections were reviewed this encounter by the provider:   Allergies  Meds  Problems         Review of Systems       BP 117/78 (BP Site: Left arm, Patient Position: Sitting, Cuff Size: Medium)   Pulse 82   Temp 98.3 F (36.8 C) (Oral)   Wt 62.6 kg (138 lb)   SpO2 98%   BMI 25.24 kg/m      Objective:     Physical Exam   Constitutional: She is oriented to person, place, and time. She appears well-developed and well-nourished.   Pulmonary/Chest: Effort normal and breath sounds normal. No respiratory distress. She has no wheezes.   occ cough with deep breathing   Neurological: She is alert and oriented to person, place, and time.   Skin: Skin is warm and dry.   Psychiatric: She has a normal mood and affect. Her behavior is normal.   Nursing note and vitals reviewed.      Assessment:     1. Cough  - benzonatate (TESSALON) 200 MG capsule; Take 1 capsule (200 mg total) by mouth 3 (three) times daily as needed for Cough  Dispense: 30 capsule; Refill: 0        Plan:     Patient Instructions   I will update your letter.  Please get your blood work done.  I have given you some Tessalon for  the cough. Let me know if you do not improve or have other worsening or concerning symptoms.        [name], MD

## 2017-12-25 NOTE — Patient Instructions (Signed)
I will update your letter.  Please get your blood work done.  I have given you some Tessalon for the cough. Let me know if you do not improve or have other worsening or concerning symptoms.

## 2017-12-25 NOTE — Progress Notes (Signed)
The patient was seen for medication reconciliation by the pharmacist.  All medications were reviewed.  During the interview, the following points were noted.    She reported starting two new medications - Atarax and Mestinon - and was worried about side effects of these medications.  Other medication changes are noted below.    Current Outpatient Prescriptions   Medication Sig   . acetaminophen (TYLENOL) 500 MG tablet Take 500 mg by mouth every 4 (four) hours as needed.Takes rarely due to liver enzymes       . albuterol (PROVENTIL HFA;VENTOLIN HFA) 108 (90 Base) MCG/ACT inhaler Inhale 2 puffs into the lungs every 4 (four) hours as needed for Wheezing.   Marland Kitchen ascorbic acid (VITAMIN C) 500 MG tablet Take 625 mg by mouth daily.Using powder formulation   . betamethasone, augmented, (DIPROLENE) 0.05 % cream APPLY TWICE DAILY AS NEEDED TO THE AFFECTED AREAS FOR 5-7 DAYS FOR ECZEMA.   Courtney Paris Extract Powder Take 150 mg by mouth daily.       . Calcium Citrate 200 MG Tab Take 400 mg by mouth 2 (two) times daily.Powder formulation       . diphenhydrAMINE (BENADRYL) 12.5 MG chewable tablet Chew 12.5 mg by mouth 4 (four) times daily as needed.   Marland Kitchen estradiol (ESTRACE) 0.1 MG/GM vaginal cream Place 1 g vaginally twice a week.   . famotidine (PEPCID) 20 MG tablet Take 20 mg by mouth 2 (two) times daily.   . ferrous sulfate 220 (44 Fe) MG/5ML solution Take 7.4 mLs (324 mg total) by mouth daily.   Marland Kitchen guaifenesin (ROBITUSSIN) 100 MG/5ML liquid Take 200 mg by mouth 4 (four) times daily as needed for Congestion.   Marland Kitchen L-Lysine 1000 MG Tab Take 1,300 mg by mouth daily.      Marland Kitchen L-Theanine 100 MG Cap Take 200 mg by mouth daily.       Marland Kitchen levocetirizine (XYZAL) 5 MG tablet Take 5 mg by mouth every evening.   Sherlynn Stalls Mineral Oil-Mineral Oil (RETAINE MGD) 0.5-0.5 % Emulsion Apply 1 drop to eye nightly.   Marland Kitchen MAGNESIUM CHLORIDE PO Take 150 mg by mouth nightly.   . Melatonin 1 MG/ML Liquid Take 0.5 drops by mouth nightly.   . methocarbamol  (ROBAXIN) 750 MG tablet Take 750 mg by mouth 4 (four) times daily.Takes 1/2 tab po Q6H PRN.  Taking once or twice a month   . methylphenidate (RITALIN) 5 MG tablet Take 1 tablet (5 mg total) by mouth 4 (four) times daily. (Patient taking differently: Take 5 mg by mouth.Takes 1 tablet by mouth TID on weekdays and BID on weekends    )   . methylphenidate (RITALIN) 5 MG tablet Take 1 tablet (5 mg total) by mouth 4 (four) times daily.Fill at least 30 days from date on rx   . methylphenidate (RITALIN) 5 MG tablet Take 1 tablet (5 mg total) by mouth 4 (four) times daily.Fill at least 60 days from date on rx   . montelukast (SINGULAIR) 4 MG Packet TAKE 2 PACKETS (8 MG TOTAL) BY MOUTH NIGHTLY. (Patient taking differently: Take 8 mg by mouth every morning.    )   . Omega-3 Fatty Acids (OMEGA-3 FISH OIL) 500 MG Cap Take 1,280 mg by mouth daily.       Marland Kitchen PREVIDENT 5000 BOOSTER PLUS 1.1 % Paste APPLY TO TEETH AFTER REGULAR BRUSHING AND FLOSSING   . pseudoephedrine (SUDAFED) 30 MG tablet Take 30 mg by mouth every 4 (four) hours as  needed for Congestion.   Marland Kitchen QVAR REDIHALER 80 MCG/ACT inhaler Take 1 puff by mouth daily.   Marland Kitchen triamcinolone acetonide (NASACORT ALLERGY 24HR) 55 MCG/ACT nasal inhaler 1 spray by Nasal route daily.       . Turmeric Curcumin 500 MG Cap Take 1 capsule by mouth daily.           Patient Education  - Pt was educated on increased drowsiness from starting atarax.    Patient Reported Medication Changes  - Added atarax  - Added mestinon  - D/c alvesco inhaler    Clarene Critchley, RPh, PharmD, BCGP  Ambulatory Care Clinical Pharmacist

## 2017-12-30 ENCOUNTER — Encounter (INDEPENDENT_AMBULATORY_CARE_PROVIDER_SITE_OTHER): Payer: Self-pay | Admitting: Family Medicine

## 2018-01-01 ENCOUNTER — Encounter (INDEPENDENT_AMBULATORY_CARE_PROVIDER_SITE_OTHER): Payer: Self-pay

## 2018-01-11 ENCOUNTER — Encounter (INDEPENDENT_AMBULATORY_CARE_PROVIDER_SITE_OTHER): Payer: Self-pay

## 2018-01-12 ENCOUNTER — Ambulatory Visit (FREE_STANDING_LABORATORY_FACILITY): Payer: 59 | Admitting: Family Medicine

## 2018-01-12 ENCOUNTER — Encounter (INDEPENDENT_AMBULATORY_CARE_PROVIDER_SITE_OTHER): Payer: Self-pay | Admitting: Hematology & Oncology

## 2018-01-12 ENCOUNTER — Encounter (INDEPENDENT_AMBULATORY_CARE_PROVIDER_SITE_OTHER): Payer: Self-pay | Admitting: Family Medicine

## 2018-01-12 VITALS — BP 121/81 | HR 79 | Temp 98.2°F | Wt 140.2 lb

## 2018-01-12 DIAGNOSIS — G2581 Restless legs syndrome: Secondary | ICD-10-CM

## 2018-01-12 DIAGNOSIS — Q7962 Hypermobile Ehlers-Danlos syndrome: Secondary | ICD-10-CM

## 2018-01-12 DIAGNOSIS — S43439A Superior glenoid labrum lesion of unspecified shoulder, initial encounter: Secondary | ICD-10-CM | POA: Insufficient documentation

## 2018-01-12 DIAGNOSIS — M25311 Other instability, right shoulder: Secondary | ICD-10-CM | POA: Insufficient documentation

## 2018-01-12 DIAGNOSIS — Q796 Ehlers-Danlos syndrome: Secondary | ICD-10-CM

## 2018-01-12 DIAGNOSIS — R790 Abnormal level of blood mineral: Secondary | ICD-10-CM

## 2018-01-12 LAB — CBC
Absolute NRBC: 0 10*3/uL (ref 0.00–0.00)
Hematocrit: 43.7 % (ref 34.7–43.7)
Hgb: 13 g/dL (ref 11.4–14.8)
MCH: 29.1 pg (ref 25.1–33.5)
MCHC: 29.7 g/dL — ABNORMAL LOW (ref 31.5–35.8)
MCV: 97.8 fL — ABNORMAL HIGH (ref 78.0–96.0)
MPV: 9.7 fL (ref 8.9–12.5)
Nucleated RBC: 0 /100 WBC (ref 0.0–0.0)
Platelets: 482 10*3/uL — ABNORMAL HIGH (ref 142–346)
RBC: 4.47 10*6/uL (ref 3.90–5.10)
RDW: 12 % (ref 11–15)
WBC: 11.07 10*3/uL — ABNORMAL HIGH (ref 3.10–9.50)

## 2018-01-12 LAB — HEMOLYSIS INDEX: Hemolysis Index: 4 (ref 0–18)

## 2018-01-12 LAB — IRON PROFILE
Iron Saturation: 15 % (ref 15–50)
Iron: 64 ug/dL (ref 40–145)
TIBC: 425 ug/dL (ref 265–497)
UIBC: 361 ug/dL (ref 126–382)

## 2018-01-12 LAB — FERRITIN: Ferritin: 18.5 ng/mL (ref 4.60–204.00)

## 2018-01-12 NOTE — Progress Notes (Signed)
Subjective:      Patient ID: Teresa [name] is a 47 y.o. female     No chief complaint on file.   Cc: followup    HPI   Liquid iron causing mouth blisters, discussed mixing in applesauce/chocolate syrup, Will discuss with pharmacist. Needs repeat complete blood count/ferritin since being on liquid iron.    Hx osteopenia.  Wondering about next dexa. But states latest scans were nl. Never on bone replacement. Dx after colon cancer. Unable to find the last DEXA in chart.  Patient will send scan.    Inquires about mammogram. Last done 06/2016. Hx colon cancer. Has to turn head with mammo. It is uncomfortable.  She believes she is up-to-date with clinical breast exams.  She will follow-up with her oncologist regarding frequency of scheduling.  She may be able to wait 2 years from her last scan.    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 dates covered by insurance.  She will give me the information regarding where to send this equipment.    She is on ritalin. She is using them sparingly.  Takes 3 on the weekdays, and 2-3 on weekends.  Discussed need for controlled substance agreement for next refill.    20 min spent with patient, of which over half spent in counseling.    The following sections were reviewed this encounter by the provider:   Allergies  Meds  Problems         Review of Systems       BP 121/81 (BP Site: Left arm, Patient Position: Sitting, Cuff Size: Medium)   Pulse 79   Temp 98.2 F (36.8 C) (Oral)   Wt 63.6 kg (140 lb 3.2 oz)   SpO2 98%   BMI 25.64 kg/m      Objective:     Physical Exam   Constitutional: She is oriented to person, place, and time. She appears well-developed and well-nourished.   Wearing neck brace.   Neurological: She is alert and oriented to person, place, and time.   Skin: Skin is warm and dry.   Psychiatric: She has a normal mood and affect. Her behavior is normal.   Nursing note and vitals reviewed.      Assessment:     1. RLS (restless  legs syndrome)  - CBC w/o diff  - Ferritin  - IRON PROFILE    2. Low ferritin  - Ferritin  - IRON PROFILE    3. Ehlers-Danlos, hypermobile type        Plan:     Patient Instructions   We will get your lab work done today.  We will keep working on your other scans and medical issues.      [name], MD

## 2018-01-12 NOTE — Progress Notes (Signed)
Have you seen any specialists/other providers since your last visit with us?      Yes, Dentist       Arm preference verified?     Yes    The patient is due for nothing at this time, HM is up-to-date.

## 2018-01-12 NOTE — Patient Instructions (Addendum)
We will get your lab work done today.  We will keep working on your other scans and medical issues.

## 2018-01-13 ENCOUNTER — Telehealth (INDEPENDENT_AMBULATORY_CARE_PROVIDER_SITE_OTHER): Payer: Self-pay | Admitting: Family

## 2018-01-13 ENCOUNTER — Encounter (INDEPENDENT_AMBULATORY_CARE_PROVIDER_SITE_OTHER): Payer: Self-pay

## 2018-01-13 DIAGNOSIS — C189 Malignant neoplasm of colon, unspecified: Secondary | ICD-10-CM

## 2018-01-13 NOTE — Telephone Encounter (Signed)
Faxed script and patient insurance card to [facility] at [phone]. Confirmation received.

## 2018-01-13 NOTE — Progress Notes (Signed)
Faxed script and patient insurance card to [facility] at [phone]. Confirmation received.

## 2018-01-14 ENCOUNTER — Other Ambulatory Visit: Payer: Self-pay | Admitting: Obstetrics & Gynecology

## 2018-01-14 LAB — HM MAMMOGRAPHY

## 2018-01-15 ENCOUNTER — Encounter: Payer: Self-pay | Admitting: Obstetrics & Gynecology

## 2018-01-15 ENCOUNTER — Encounter (INDEPENDENT_AMBULATORY_CARE_PROVIDER_SITE_OTHER): Payer: Self-pay | Admitting: Neurology

## 2018-01-15 ENCOUNTER — Encounter (INDEPENDENT_AMBULATORY_CARE_PROVIDER_SITE_OTHER): Payer: Self-pay | Admitting: Family Medicine

## 2018-01-15 ENCOUNTER — Ambulatory Visit (INDEPENDENT_AMBULATORY_CARE_PROVIDER_SITE_OTHER): Payer: 59 | Admitting: Neurology

## 2018-01-15 VITALS — BP 124/81 | HR 76 | Ht 62.0 in | Wt 138.0 lb

## 2018-01-15 DIAGNOSIS — G9050 Complex regional pain syndrome I, unspecified: Secondary | ICD-10-CM

## 2018-01-15 DIAGNOSIS — G243 Spasmodic torticollis: Secondary | ICD-10-CM

## 2018-01-15 DIAGNOSIS — R251 Tremor, unspecified: Secondary | ICD-10-CM

## 2018-01-15 DIAGNOSIS — M4802 Spinal stenosis, cervical region: Secondary | ICD-10-CM

## 2018-01-15 DIAGNOSIS — G43119 Migraine with aura, intractable, without status migrainosus: Secondary | ICD-10-CM

## 2018-01-15 DIAGNOSIS — G5621 Lesion of ulnar nerve, right upper limb: Secondary | ICD-10-CM

## 2018-01-15 DIAGNOSIS — R202 Paresthesia of skin: Secondary | ICD-10-CM

## 2018-01-15 MED ORDER — MAGNESIUM OXIDE 400 MG PO TABS
400.00 mg | ORAL_TABLET | Freq: Every day | ORAL | 2 refills | Status: DC
Start: 2018-01-15 — End: 2018-03-16

## 2018-01-15 MED ORDER — COQ10 100 MG PO CAPS
100.00 mg | ORAL_CAPSULE | Freq: Three times a day (TID) | ORAL | 2 refills | Status: DC
Start: 2018-01-15 — End: 2018-03-31

## 2018-01-15 MED ORDER — VITAMIN B-2 100 MG PO TABS
400.00 mg | ORAL_TABLET | Freq: Every day | ORAL | 2 refills | Status: DC
Start: 2018-01-15 — End: 2018-05-07

## 2018-01-15 NOTE — Patient Instructions (Signed)
Thank you for visiting the [facility].  It was a pleasure seeing you today!      Our Plan:  -I'd like you to see either Dr. Kateri Plummer or Dr. 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    Results:  If you have completed any imaging, labs, or other neurological testing, I will always go over your results with you at your follow up visit. If you would like to access your results prior to that, please sign up for My Chart access at home.     Patient Satisfaction Survey:  If you receive a patient satisfaction survey, I would greatly appreciate it if you would complete it.     We value your feedback! Let me know if there are things we could have done better for your office visit.    Contact Me Online:  [facility] now has a patient portal called MyChart.  Please considering signing up for MyChart.  This is the best way to communicate with me, including getting test results, requesting medication refills, or for questions and concerns that come up between our office visits.     Of note, MyChart should not be used if you are having a medical emergency -- in that case call 911.      Follow Up Appointments:  We allocate your appointment time specifically for you and your needs.  Please be courteous and call the office at least 24 hours in advance if you are unable to make it to your appointment and you need to reschedule.      New Medications and Refills:  I recommend that you call the pharmacy before picking up any medications that  were called in or electronically faxed during your office visit in order to verify that your prescription was received and that it is ready for pick up.     Medication Side Effects/Reactions:   Stop taking your medication if you are concerned that you may be having an allergic reaction or if you are having significant side effects.  Please call the office ASAP to let me know that you are having problems with the medication.     For Next Visit:  Please remember to bring your medication bottles or an up-to-date medication list to your next visit.     Thank you for entrusting me with your health and for giving me the privilege of caring for you today!

## 2018-01-15 NOTE — Telephone Encounter (Signed)
Called [employer] at [phone]. Left message for Teresa [name]. Leggings: Juzo Soft Leggings, 15-20 mmHg, size 1(I) (small) , and Solidea bilateral arm sleeves, 15/21 mmHg, size Medium.

## 2018-01-15 NOTE — Progress Notes (Signed)
01/15/2018    Chief Complaint:   Chief Complaint   Patient presents with   . Tremors   . Dystonia   . Headache   . CRPS   . Numbness     History of Present Illness:  Teresa [name] is a 47 y.o. right-handed female who presents for follow up of multiple neurological complaints.  She was previously followed by my colleague Dr. Artemio Aly.  She last saw Dr. Stacy Gardner on 05/20/2016.  At that time, he recommended consideration of Botox for cervical dystonia, vitamin therapy for her headaches, and follow up with spine physician.     Complaints today include: tremors/dystonia/other muscle spasms, allergist suggested I be screened for cluster headaches, chiropractor and dentist suggested that I be screened for RSD syndrome/CPRS, variable level of numbness and tingling in both hands and arms (esp. Between a spot on the back of my arm just above elbow down to my pinkie & 4th finger on right).     Head pulls back and to the left.  Has associated neck pain and stiffness.    Makes it hard for her to sleep.   She had Botox injections at [facility] x 3.   First two, helped a little.   Last time, had dose increase and "this was disastrous".   Has a history of RLS.   Thinks she may also have PLMS.   Since 2008, has felt tremors in her legs.   This is an internal sensation; feels like her legs are vibrating.   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     Previously diagnosed by migraines.   Had headaches early in her life while on OCPs in her 59s.   Headaches recurred in her 40s.   She always has low level nausea, scintillating scotomas, light sensitivity.   Severity of these fluctuates.   Headaches consist of pain starting in her suboccipital region.   Radiates around her head.   Feels like a tightness, as if her brain was swollen or her scalp too tight.   These headaches were daily until she started wearing an ace bandage to help with jaw hypermobility.   Can progress to her feeling like a pressure over the vertex, like a bowling ball is  sitting on top of her head.  This happens three times a week. Can last all day.   At times this progresses even further to feeling that she has railroad spike feeling over her left eye.  Can also occur on right or in the middle but less frequently.  These can be triggered by strawberries or tofu. Otherwise happens about 1 per week.  Lasts about an hour.   With these, face feels congested, left eye is twitching, has a droopy feeling.  Joint pain is severe.   No conjunctival tearing, ingestion, rhinorrhea.   Also gets "migraines", where she has these symptoms to a more severe degree and also has sound sensitivity. With these, she may need to lay down in dark room or she may be restless, "like I'm crawling out of my skin", and needs to pace. These occur maybe once every 6 months.  Better since going off of Bupropion.   Headaches were previously helped by Tylenol but she had elevation in her liver enzymes, so she can no longer take that.   Sometimes, if she takes a Benadryl when she gets the swelling in her head feeling, it helps.     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  Using Xyzal, Atarax, Pepcid bid; started these in February or later.   Not sure if these are helping her headaches.   She is also seeing cardiology for dysautonomia.     Also noting some numbness, tingling in hands.   Worse on the right.   Lateral aspect of right elbow radiating into right 4th and 5th digits.   Makes it hard for her sleep.     Review of Systems:  Constitutional: Negative for fever. +Low energy level. +Weight change.   Respiratory: +Asthma. +Shortness of breath.  +History of chronic bronchitis.   Cardiovascular: +Chest pain. +Irregular rhythm. +High blood pressure.  Gastrointestinal: Negative for abdominal pain. +Diarrhea. +Constipation.   Neurological: Negative for visual changes, alteration in awareness, speech or language problems, facial droop, focal weakness, limb incoordination,dysphagia, or  dizziness. +Neck pain. ?Seizure. +Headaches. +Back pain. +Tremor. +Balance trouble. +Numbness/tingling. +Trouble sleeping. +RLS.   Psychiatric/Behavioral: +Depression.   Also: Cancer. Arthritis. Anxiety. Anemia. Bladder problem.  Rash. Hypermobile Ehlers-Danlos syndrome. Fibromyalgia. Migraines. Dysautonomia. 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 Bilateral tinnitus.  Pulsatile tinnitus in left ear.   All other systems reviewed and are negative except pertinent positives as noted above in HPI.     Past Medical History:  Past Medical History:   Diagnosis Date   . ADHD (attention deficit hyperactivity disorder)    . Anemia     constant   . Anxiety    . Arrhythmia     pt states she belives anemia led to heart flutters in the past.   . Asthma    . Depression    . Dysphagia    . Eczema    . Fibromyalgia    . Gastroesophageal reflux disease    . Headache     migraine, sensitivity to light   . Heart burn    . Hypercholesterolemia 12/24/2015   . Malignant neoplasm of colon 2009    chemo   . Malignant neoplasm of skin 2007    basal   . Neck pain    . Osteoporosis, idiopathic     Not sure.  Have had osteopenia in past but not now.   . Post-operative nausea and vomiting    . Psoriasis    . Seasonal allergic rhinitis    . Sleep apnea     does not use CPAP     Past Surgical History:  Past Surgical History:   Procedure Laterality Date   . APPENDECTOMY     . COLECTOMY  2009    partial   . COLONOSCOPY  2014   . COLONOSCOPY N/A 11/13/2014    Procedure: COLONOSCOPY;  Surgeon: Huntley Estelle, MD;  Location: [facility];  Service: Gastroenterology;  Laterality: N/A;   . EGD  2014   . EGD, DILATION N/A 11/13/2014    Procedure: EGD, DILATION;  Surgeon: Huntley Estelle, MD;  Location: [facility];  Service: Gastroenterology;  Laterality: N/A;  EGD, DILATION/COLONOSCOPY W/IVA   . REDUCTION MAMMAPLASTY  2006   . uterine ablation   2011     Allergies:  Codeine; Morphine; Silenor [doxepin]; Gabapentin; Advair diskus  [fluticasone-salmeterol]; Bupropion; Ciprofloxacin; Egg white [albumen, egg]; Epinephrine; Ginger; Levaquin [levofloxacin]; Soybean-containing drug products; Strawberry c [ascorbate]; Walnuts [tree nuts]; Dairy [milk-related compounds]; Eggs or egg-derived products; Ioversol; Lanolin; Lidocaine; Penicillins; and Wheat extract    Family History:  Family History   Problem Relation Age of Onset   . Colon cancer Father    . Cancer Father  colon   . Early death Father         Age 43, colon cancer   . Depression Mother    . Diabetes Mother         Pre-diabetes; not diabetes   . Heart disease Mother    . Hyperlipidemia Mother    . Vision loss Mother         cataracts and glaucoma   . Obesity Maternal Aunt    No other family history related to current complaints.    Social History:  Married.  On medical leave. No tobacco, alcohol, drug abuse.     Medications:  Current Outpatient Prescriptions   Medication Sig Dispense Refill   . acetaminophen (TYLENOL) 500 MG tablet Take 500 mg by mouth every 4 (four) hours as needed.Takes rarely due to liver enzymes         . albuterol (PROVENTIL HFA;VENTOLIN HFA) 108 (90 Base) MCG/ACT inhaler Inhale 2 puffs into the lungs every 4 (four) hours as needed for Wheezing. 1 Inhaler 0   . ascorbic acid (VITAMIN C) 500 MG tablet Take 625 mg by mouth daily.Using powder formulation     . betamethasone, augmented, (DIPROLENE) 0.05 % cream APPLY TWICE DAILY AS NEEDED TO THE AFFECTED AREAS FOR 5-7 DAYS FOR ECZEMA.  2   . Boswellia Serrata Extract Powder Take 150 mg by mouth daily.         . Calcium Citrate 200 MG Tab Take 400 mg by mouth 2 (two) times daily.Powder formulation         . diphenhydrAMINE (BENADRYL) 12.5 MG chewable tablet Chew 12.5 mg by mouth 4 (four) times daily as needed.     Marland Kitchen estradiol (ESTRACE) 0.1 MG/GM vaginal cream Place 1 g vaginally twice a week. 42.5 g 4   . famotidine (PEPCID) 20 MG tablet Take 20 mg by mouth 2 (two) times daily.     . ferrous sulfate 220 (44 Fe)  MG/5ML solution Take 7.4 mLs (324 mg total) by mouth daily. 473 mL 1   . guaifenesin (ROBITUSSIN) 100 MG/5ML liquid Take 200 mg by mouth 4 (four) times daily as needed for Congestion.     . hydrOXYzine (ATARAX) 50 MG tablet Take 50 mg by mouth nightly     . L-Lysine 1000 MG Tab Take 1,300 mg by mouth daily.        Marland Kitchen L-Theanine 100 MG Cap Take 200 mg by mouth daily.         Marland Kitchen levocetirizine (XYZAL) 5 MG tablet Take 5 mg by mouth every evening.     Sherlynn Stalls Mineral Oil-Mineral Oil (RETAINE MGD) 0.5-0.5 % Emulsion Apply 1 drop to eye nightly.     Marland Kitchen MAGNESIUM CHLORIDE PO Take 150 mg by mouth nightly.     . Melatonin 1 MG/ML Liquid Take 0.5 drops by mouth nightly.     . methylphenidate (RITALIN) 5 MG tablet Take 1 tablet (5 mg total) by mouth 4 (four) times daily. (Patient taking differently: Take 5 mg by mouth.Takes 1 tablet by mouth TID on weekdays and BID on weekends    ) 120 tablet 0   . methylphenidate (RITALIN) 5 MG tablet Take 1 tablet (5 mg total) by mouth 4 (four) times daily.Fill at least 30 days from date on rx 120 tablet 0   . methylphenidate (RITALIN) 5 MG tablet Take 1 tablet (5 mg total) by mouth 4 (four) times daily.Fill at least 60 days from date on rx 120 tablet 0   .  montelukast (SINGULAIR) 4 MG Packet TAKE 2 PACKETS (8 MG TOTAL) BY MOUTH NIGHTLY. (Patient taking differently: Take 8 mg by mouth every morning.    ) 180 packet 4   . Omega-3 Fatty Acids (OMEGA-3 FISH OIL) 500 MG Cap Take 1,280 mg by mouth daily.         Marland Kitchen PREVIDENT 5000 BOOSTER PLUS 1.1 % Paste APPLY TO TEETH AFTER REGULAR BRUSHING AND FLOSSING  0   . pseudoephedrine (SUDAFED) 30 MG tablet Take 30 mg by mouth every 4 (four) hours as needed for Congestion.     Marland Kitchen pyridostigmine (MESTINON) 60 MG tablet Take 30 mg by mouth 4 (four) times daily     . QVAR REDIHALER 80 MCG/ACT inhaler Take 1 puff by mouth daily.     Marland Kitchen triamcinolone acetonide (NASACORT ALLERGY 24HR) 55 MCG/ACT nasal inhaler 1 spray by Nasal route daily.         . Turmeric  Curcumin 500 MG Cap Take 1 capsule by mouth daily.           No current facility-administered medications for this visit.      General Exam:  BP 124/81 (BP Site: Right arm, Patient Position: Sitting, Cuff Size: Small)   Pulse 76   Ht 1.575 m (5\' 2" )   Wt 62.6 kg (138 lb)   BMI 25.24 kg/m   Gen:  Well-developed, well-nourished.  No acute distress.  Cooperative with exam.  HEENT:  Wearing hiking/bucket type hat.  Has ACE bandage wrapped around bottom of jaw up to vertex of head on both side. Slightly tinted glasses.  Otherwise NC/AT.  Neck: Severely impaired in lateral rotation and extension. Slight no-no head tremor note. Mild laterocollis to left.   CV:  RRR.  S1/S2.  No murmurs.  No carotid bruits.   Lungs:  CTA bilaterally.   Extrem:  No edema.  Skin:  No obvious rashes or lesions.   Neuro Exam:  MENTAL STATUS:  Awake, alert, oriented x 3.  Affect is normal. Normal attention and concentration.  Follows commands.  Normal affect and mood.  Speech is fluent, non dysarthric.  No apraxia or neglect.  Remote and short term/'recent memory intact. Fund of knowledge appropriate for age/education.    CRANIAL NERVES:    CN I - Not tested.   CN II - Visual fields are full to confrontation.  Ophthalmoscopic exam was attempted but I was unable to appreciate the fundi..   CN III, IV, VI - PERRL.  EOMI.  No nystagmus.   CN V - Decreased sensation over left V1-V3.   CN VII- No facial droop.  Facial movements symmetric.   CN VIII - Hearing intact to conversational speech.   CN IX, X - Palate elevates symmetrically. Normal phonation.  CN XI - Shoulder shrug symmetric.   CN XII - Tongue protrusion midline and without fasciculations.   MOTOR:  Normal bulk and tone.  No pronator drift.  Strength is symmetric in all 4 extremities, proximal and distal.  No asterixis, myoclonus or other abnormal movements.   SENSATION:  Decreased sensation in right ulnar nerve distribution.   COORDINATION: No clumsiness on RAMs.  Intact FNF.  Normal  FFM.   DTRs:  Present and symmetric.  GAIT:  Normal gait and station.     Investigations:  Labs:  Lab Results   Component Value Date    WBC 11.07 (H) 01/12/2018    HGB 13.0 01/12/2018    HCT 43.7 01/12/2018    MCV 97.8 (  H) 01/12/2018    PLT 482 (H) 01/12/2018     '  Chemistry        Component Value Date/Time    NA 142 07/20/2017 1939    K 3.8 07/20/2017 1939    CL 106 07/20/2017 1939    CO2 25 07/20/2017 1939    BUN 16.0 07/20/2017 1939    CREAT 0.7 07/20/2017 1939    GLU 107 (H) 07/20/2017 1939        Component Value Date/Time    CA 10.1 07/20/2017 1939    ALKPHOS 84 03/12/2017 0857    AST 23 03/12/2017 0857    ALT 16 03/12/2017 0857    BILITOTAL 0.7 03/12/2017 0857        Imaging:  MRI Cervical Spine Without Contrast 07/29/2017   (I personally reviewed source images and I agree with the radiologist's interpretation as noted below):  Impression:  1. No segmental instability identified.   2. Small disc herniations at the C3-4, C4-C5, and C7-T1 levels without stenosis.  3. Spondylotic change at C6-7 level with mild bilateral neuroforaminal stenosis.     MRI Brain With and Without Contrast 11/25/2016   (I personally reviewed source images and I agree with the radiologist's interpretation as noted below):  IMPRESSION:   1. Normal MR examination of the internal auditory canals and  cerebellopontine angles.  2. No acute intracranial abnormality. Minimal nonspecific white matter  changes with differential considerations as discussed.    Other Studies:  N/a    Records Reviewed:  Office visit note by Dr. Artemio Aly from 05/20/2016    Impression:  In summary, Teresa [name] is a 47 y.o. female with a complicated medical history who presents for evaluation of multiple complaints including right>left upper extremity tingling, headaches, h/o cervical dystonia, head and other tremors as well as requesting evaluation for RSD/CPRS.      Recommendations:  1. Cervical stenosis of spinal canal  2. 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 Intractable migraine with aura without status migrainosus  - Increase magnesium dose  - Add vitamin B2 400 mg daily and CoQ10 100 mg tid; Medication dosing schedule and potential side effects were reviewed.    5. Cervical dystonia  6. Tremors of nervous system  - I've asked her to see one of my colleagues that specializes in movement disorders (Dr. Kateri Plummer or Dr. Johnston Ebbs)    7. RSD  - Recommend pain management eval    Follow up with me after EMG    De Hollingshead, MD  [facility]  Board Certified in Adult Neurology by the American Board of Psychiatry and Neurology (ABPN)  Board Certified in Headache Medicine by the [employer] (UCNS)  01/15/2018

## 2018-01-16 ENCOUNTER — Encounter (INDEPENDENT_AMBULATORY_CARE_PROVIDER_SITE_OTHER): Payer: Self-pay | Admitting: Hematology & Oncology

## 2018-01-16 ENCOUNTER — Encounter (INDEPENDENT_AMBULATORY_CARE_PROVIDER_SITE_OTHER): Payer: Self-pay | Admitting: Family Medicine

## 2018-01-18 NOTE — Progress Notes (Signed)
Mammogram Category 2.

## 2018-01-19 ENCOUNTER — Encounter (INDEPENDENT_AMBULATORY_CARE_PROVIDER_SITE_OTHER): Payer: Self-pay | Admitting: Hematology & Oncology

## 2018-01-20 ENCOUNTER — Encounter (INDEPENDENT_AMBULATORY_CARE_PROVIDER_SITE_OTHER): Payer: Self-pay

## 2018-01-20 ENCOUNTER — Other Ambulatory Visit (INDEPENDENT_AMBULATORY_CARE_PROVIDER_SITE_OTHER): Payer: Self-pay

## 2018-01-20 DIAGNOSIS — Z0000 Encounter for general adult medical examination without abnormal findings: Secondary | ICD-10-CM

## 2018-01-21 DIAGNOSIS — K635 Polyp of colon: Secondary | ICD-10-CM | POA: Insufficient documentation

## 2018-01-21 DIAGNOSIS — I499 Cardiac arrhythmia, unspecified: Secondary | ICD-10-CM | POA: Insufficient documentation

## 2018-01-21 DIAGNOSIS — K5909 Other constipation: Secondary | ICD-10-CM | POA: Insufficient documentation

## 2018-01-21 DIAGNOSIS — E7800 Pure hypercholesterolemia, unspecified: Secondary | ICD-10-CM | POA: Insufficient documentation

## 2018-01-25 ENCOUNTER — Encounter (INDEPENDENT_AMBULATORY_CARE_PROVIDER_SITE_OTHER): Payer: Self-pay | Admitting: Neurology

## 2018-01-25 ENCOUNTER — Other Ambulatory Visit (INDEPENDENT_AMBULATORY_CARE_PROVIDER_SITE_OTHER): Payer: Self-pay | Admitting: Family Medicine

## 2018-01-25 NOTE — Progress Notes (Deleted)
iro

## 2018-01-28 ENCOUNTER — Encounter (INDEPENDENT_AMBULATORY_CARE_PROVIDER_SITE_OTHER): Payer: Self-pay

## 2018-01-29 ENCOUNTER — Ambulatory Visit (INDEPENDENT_AMBULATORY_CARE_PROVIDER_SITE_OTHER): Payer: 59 | Admitting: Family Medicine

## 2018-01-29 ENCOUNTER — Encounter (INDEPENDENT_AMBULATORY_CARE_PROVIDER_SITE_OTHER): Payer: Self-pay | Admitting: Family Medicine

## 2018-01-29 VITALS — BP 115/76 | HR 79 | Temp 98.1°F | Wt 137.4 lb

## 2018-01-29 DIAGNOSIS — R682 Dry mouth, unspecified: Secondary | ICD-10-CM

## 2018-01-29 DIAGNOSIS — M8580 Other specified disorders of bone density and structure, unspecified site: Secondary | ICD-10-CM

## 2018-01-29 DIAGNOSIS — Z9221 Personal history of antineoplastic chemotherapy: Secondary | ICD-10-CM

## 2018-01-29 DIAGNOSIS — E2749 Other adrenocortical insufficiency: Secondary | ICD-10-CM | POA: Insufficient documentation

## 2018-01-29 DIAGNOSIS — E28319 Asymptomatic premature menopause: Secondary | ICD-10-CM

## 2018-01-29 DIAGNOSIS — H04129 Dry eye syndrome of unspecified lacrimal gland: Secondary | ICD-10-CM

## 2018-01-29 DIAGNOSIS — R069 Unspecified abnormalities of breathing: Secondary | ICD-10-CM

## 2018-01-29 NOTE — Progress Notes (Signed)
Have you seen any specialists/other providers since your last visit with us?    Yes    Arm preference verified?   Yes    The patient is due for nothing at this time, HM is up-to-date.

## 2018-01-29 NOTE — Progress Notes (Addendum)
Subjective:      Patient ID: Teresa [name] is a 47 y.o. female     Chief Complaint   Patient presents with   . Consult (Initial)        HPI   She has hx asthma, but was told it was atypical per ENT. Bronchodilators "made some sympt worse and other better." No wheezing. Sometimes feels she cannot get a full breath. Not ass'd with exercise. Allergens make it worse. States worse on high ozone days. Worst in Aug and Sept. Has had workup for MAST cell by cardio and allergy. So far negative. Hx EDS, dysautonomia. Saw cardiology who thought V/Q scan would be next step.     She had abnormal titers-smooth muscle antibody, and elevated carbonic anhydrase.  She has noticed dry mouth and dry eye.  She has had periods in her life where she was prone to increased cavities.  She was told by the ophthalmologist that she may be a candidate for Sjogren's diagnosis.  She would like to see a rheumatologist.      We had discussed getting a DEXA scan in previous emails.  She would like the order.  She has a history of osteopenia.    The following sections were reviewed this encounter by the provider:   Allergies  Meds  Problems         Review of Systems   No coughing. No congestion.       BP 115/76 (BP Site: Left arm, Patient Position: Sitting, Cuff Size: Medium)   Pulse 79   Temp 98.1 F (36.7 C) (Oral)   Wt 62.3 kg (137 lb 6.4 oz)   SpO2 99%   BMI 25.13 kg/m      Objective:     Physical Exam   Constitutional: She is oriented to person, place, and time. She appears well-developed and well-nourished.   HENT:   Head: Normocephalic and atraumatic.   Neck sling. Mucous membranes slightly tacky.   Cardiovascular: Normal rate, regular rhythm and normal heart sounds.  Exam reveals no friction rub.    No murmur heard.  Pulmonary/Chest: Effort normal and breath sounds normal. No respiratory distress. She has no wheezes.   Neurological: She is alert and oriented to person, place, and time.   Skin: Skin is warm and dry.    Psychiatric: She has a normal mood and affect. Her behavior is normal.   Nursing note and vitals reviewed.      Assessment:     1. Abnormal breathing  - Ambulatory referral to Pulmonology    2. Dry eye  - Ambulatory referral to Rheumatology    3. Dry mouth  - Ambulatory referral to Rheumatology    4. Early menopause  - Dxa Bone Density Axial Skeleton; Future    5. History of chemotherapy  - Dxa Bone Density Axial Skeleton; Future    6. Osteopenia, unspecified location  - Dxa Bone Density Axial Skeleton; Future        Plan:     There are no Patient Instructions on file for this visit.    [name], MD

## 2018-01-30 ENCOUNTER — Ambulatory Visit (INDEPENDENT_AMBULATORY_CARE_PROVIDER_SITE_OTHER): Payer: 59

## 2018-01-30 ENCOUNTER — Other Ambulatory Visit (INDEPENDENT_AMBULATORY_CARE_PROVIDER_SITE_OTHER): Payer: Self-pay | Admitting: Family

## 2018-01-30 ENCOUNTER — Other Ambulatory Visit: Payer: Self-pay | Admitting: Family Medicine

## 2018-01-30 DIAGNOSIS — M25562 Pain in left knee: Secondary | ICD-10-CM

## 2018-01-30 LAB — HM DEXA SCAN

## 2018-02-02 ENCOUNTER — Encounter (INDEPENDENT_AMBULATORY_CARE_PROVIDER_SITE_OTHER): Payer: Self-pay

## 2018-02-02 ENCOUNTER — Other Ambulatory Visit (FREE_STANDING_LABORATORY_FACILITY): Payer: 59

## 2018-02-02 ENCOUNTER — Encounter (INDEPENDENT_AMBULATORY_CARE_PROVIDER_SITE_OTHER): Payer: Self-pay | Admitting: Family Medicine

## 2018-02-02 DIAGNOSIS — C189 Malignant neoplasm of colon, unspecified: Secondary | ICD-10-CM

## 2018-02-02 LAB — OCCULT BLOOD X 3, STOOL
Stool Occult Blood Specimen 1: NEGATIVE
Stool Occult Blood Specimen 2: NEGATIVE
Stool Occult Blood Specimen 3: NEGATIVE

## 2018-02-04 ENCOUNTER — Other Ambulatory Visit (FREE_STANDING_LABORATORY_FACILITY): Payer: 59

## 2018-02-04 DIAGNOSIS — G909 Disorder of the autonomic nervous system, unspecified: Secondary | ICD-10-CM

## 2018-02-08 LAB — TRYPTASE: Tryptase: 5.4 (ref <11.0)

## 2018-02-09 ENCOUNTER — Other Ambulatory Visit: Payer: 59

## 2018-02-09 ENCOUNTER — Ambulatory Visit (INDEPENDENT_AMBULATORY_CARE_PROVIDER_SITE_OTHER): Payer: 59 | Admitting: Neurology

## 2018-02-09 ENCOUNTER — Encounter (INDEPENDENT_AMBULATORY_CARE_PROVIDER_SITE_OTHER): Payer: Self-pay | Admitting: Neurology

## 2018-02-09 VITALS — BP 137/80 | HR 83 | Ht 62.0 in | Wt 139.0 lb

## 2018-02-09 DIAGNOSIS — G243 Spasmodic torticollis: Secondary | ICD-10-CM

## 2018-02-09 DIAGNOSIS — R251 Tremor, unspecified: Secondary | ICD-10-CM

## 2018-02-09 DIAGNOSIS — G4752 REM sleep behavior disorder: Secondary | ICD-10-CM

## 2018-02-09 DIAGNOSIS — F419 Anxiety disorder, unspecified: Secondary | ICD-10-CM

## 2018-02-09 NOTE — Progress Notes (Signed)
[email or web address]    Subjective:      47 y/o F with complicated medical history, presents to Movement disorders clinic for evaluation.     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  Prior to that was diagnosed with cervical dystonia, receiving Xeomin injections 3x and didn't respond. Third time was 'disastrous'. Doesn't want to repeat.  Also turns out the 'left side of my jaw is falling off of my head'. Dislocates 'all the time'. Using wraps and getting appliances made.  Doing this has resolved a lot of the pain and the 'cervical dystonia'.    Still having tremor symptoms that has happened for years. Her hands can tremor, legs can tremor at times.  Fluctuates, not present all the time. Also notices tremoring at night when she is lying in bed, feels like the bed is vibrating through her legs and can interfere with her sleep. She also describes blepharospasm, which happens when she 'shuts my eyes lightly'.  Also diagnosed with nystagmus is 2008. Describes also a scintillating scotoma. Words can get 'wavy at times' and at night when she gets up, she feels she sees light flashing. Does report a history of occasional 'dissociative episodes' but no LOC.    Can wake with her arm raised in the air. Also describes active dreaming, punching and moving in her sleep. Describes an area of sinsitivity on the top of her head.  Also describes a hx of RLS diagnosed during a sleep study but symptomatic.     Was diagnosed with 'dysautonomia' but didn't meet the threshold for POTS.  Did have an abnormal tilt table test, and is taking mestinon 45mg  4x daily. This is relatively new.     She reports not wanting to start a new medication but instead wants to come to 'an intellectual understanding'.     No family history of Parkinson's or tremor, no history of head injury or LOC, no history of chemical or neuroleptic exposure.  Surgical and social history reviewed.    MRI Cervical spine reviewed (07/29/2017):      1. No segmental instability identified.   2. Small disc herniations at the C3-4, C4-C5, and C7-T1 levels without stenosis.  3. Spondylotic change at C6-7 level with mild bilateral neuroforaminal stenosis.     MRI Brain With and Without Contrast reviewed (11/25/2016):    IMPRESSION:   1. Normal MR examination of the internal auditory canals and  cerebellopontine angles.  2. No acute intracranial abnormality. Minimal nonspecific white matter  changes with differential considerations as discussed.    The following portions of the patient's history were reviewed and updated as appropriate: allergies, current medications, past family history, past medical history, past surgical history and problem list.    Review of Systems  + tremor, gait disorder, neck/jaw issue  No recent illness. Denies fever, chills, cough, sinus pain, eye pain, eye redness, ear pain, rhinorrhea, sore throat, chest pain, SOB, wheezing, abd pain, Nausea, Vomiting, diarrhea, constipation, dysuria, or rashes.  All other systems reviewed and are negative except as previously noted in the HPI.    Objective:     Vitals:    02/09/18 1039   BP: 137/80   Pulse: 83     Constitutional: NAD   Eyes: Clear conjunctiva  Cardiovascular: RRR  Respiratory: CTA B   Musculoskeletal: Normal posture and muscle bulk.  Integumentary: No abnormal rash noted  Psych: Normal affect    Neurological:   Mental Status: Alert & oriented to person, place, month, & year.  Language: Spontaneous & fluent speech, good comprehension.    Cranial Nerves:  II, III: PERRL  III, IV, VI: EOMI, No nystagmus, no palsies, no ptosis  V: Intact to LT V1-V3 distribution bilaterally.   VI: Symmetrical face and expression.   VIII: Hearing intact to finger rub bilaterally.   IX, X: Palate/Uvula elevates symmetrically.   XI: 5/5 Trapezius & SCM bilaterally.   XII: Tongue is midline.     Motor Exam: Normal bulk and tone. No clonus. No pronator drift. Strength 5/5 throughout and  symmetric.    Sensation: Sensation to LT intact grossly through symmetrically. Romberg negative.    Cerebellar:   Finger to Nose: intact bilaterally    Reflexes: 2+ throughout, symmetric, with down-going toes.    Station/ Gait: Well balanced and stable. Normal foot width.  Normal arm swing.    Normal tone, no tremor at rest.  Slight tremulousness bilaterally on position of arms, minimal.  Fine motor use of both hands relatively symmetric to open close rapid alternating movement and finger tapping. Foot tapping also symmetric. No asterixis.     Assessment:     47 y/o F with complicated medical history, presents to Movement disorders clinic for evaluation.  Exam is relatively nonfocal as above, which is reassuring.  She does carry a complex medical history with multiple comorbid medical issues.  Thankfully, the neck/jaw issues seem to be improved by splinting her jaw, which she can continue.    Her tremors are mainly positional and with activity, possibly representing an underlying essential tremor component though more likely related to her multiple comorbid medical issues and a degree of anxiety overlay all of this.  We discussed treatment options which are available.  I do believe she would respond well to a trial of propranolol ER, as this could address her tremors, be a component of migraine prevention, and calm down the RSD/anxiety component that is present.  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  She could also try 5 to 6 mg of melatonin about an hour before bedtime.    For now, she wants to hold on treating, and wanted to reach an intellectual understanding which I believe was accomplished    Plan:     As above.     RTC on a PRN basis. Patient can follow up sooner if needed. In the meantime, patient will contact the office with any questions or concerns.      -----------------------------------------------------------    Additional notes and data scanned including patient questionnaire which may contain pertinent information to visit.     More than 50% of this 60 min office visit was spent counseling the patient on:   Pt's specific disease state including discussion about the medical condition, diagnostic and treatment options, as well as the plan as above.  We also discussed medication options, what pt is taking, indications and side effects. Finally we discussed specific lifestyle issues related to pt's condition including need for exercise and other lifestyle modifications.      Dontarious Schaum "Johnston Ebbs, MD  Director, Movement Disorders Specialist  [facility]  [facility]    [address]  [address]  [place] [address]    [place], [place] [address]  T [phone]  F [phone]   T [phone]  F [phone]     [email or web address]

## 2018-02-11 ENCOUNTER — Encounter (INDEPENDENT_AMBULATORY_CARE_PROVIDER_SITE_OTHER): Payer: Self-pay | Admitting: Neurology

## 2018-02-12 ENCOUNTER — Encounter (INDEPENDENT_AMBULATORY_CARE_PROVIDER_SITE_OTHER): Payer: Self-pay | Admitting: Family Medicine

## 2018-02-12 DIAGNOSIS — M949 Disorder of cartilage, unspecified: Secondary | ICD-10-CM | POA: Insufficient documentation

## 2018-02-16 ENCOUNTER — Encounter (INDEPENDENT_AMBULATORY_CARE_PROVIDER_SITE_OTHER): Payer: Self-pay

## 2018-02-17 ENCOUNTER — Encounter (HOSPITAL_BASED_OUTPATIENT_CLINIC_OR_DEPARTMENT_OTHER): Payer: Self-pay

## 2018-02-17 ENCOUNTER — Other Ambulatory Visit (INDEPENDENT_AMBULATORY_CARE_PROVIDER_SITE_OTHER): Payer: Self-pay

## 2018-02-17 DIAGNOSIS — E28319 Asymptomatic premature menopause: Secondary | ICD-10-CM

## 2018-02-17 DIAGNOSIS — Z9221 Personal history of antineoplastic chemotherapy: Secondary | ICD-10-CM

## 2018-02-17 DIAGNOSIS — M8580 Other specified disorders of bone density and structure, unspecified site: Secondary | ICD-10-CM

## 2018-02-17 NOTE — Progress Notes (Signed)
Mailed cd of [facility] records to the address on file per doctor Miller's request.

## 2018-02-19 ENCOUNTER — Encounter (INDEPENDENT_AMBULATORY_CARE_PROVIDER_SITE_OTHER): Payer: Self-pay | Admitting: Family Medicine

## 2018-02-19 ENCOUNTER — Encounter (INDEPENDENT_AMBULATORY_CARE_PROVIDER_SITE_OTHER): Payer: Self-pay

## 2018-02-19 ENCOUNTER — Encounter (INDEPENDENT_AMBULATORY_CARE_PROVIDER_SITE_OTHER): Payer: Self-pay | Admitting: Neurology

## 2018-03-01 ENCOUNTER — Other Ambulatory Visit: Payer: Self-pay | Admitting: Physician Assistant

## 2018-03-01 ENCOUNTER — Encounter (INDEPENDENT_AMBULATORY_CARE_PROVIDER_SITE_OTHER): Payer: Self-pay

## 2018-03-01 DIAGNOSIS — R1310 Dysphagia, unspecified: Secondary | ICD-10-CM

## 2018-03-01 DIAGNOSIS — R1012 Left upper quadrant pain: Secondary | ICD-10-CM

## 2018-03-01 DIAGNOSIS — R110 Nausea: Secondary | ICD-10-CM

## 2018-03-02 ENCOUNTER — Encounter (INDEPENDENT_AMBULATORY_CARE_PROVIDER_SITE_OTHER): Payer: Self-pay | Admitting: Family Medicine

## 2018-03-03 ENCOUNTER — Telehealth: Payer: Self-pay | Admitting: Hematology & Oncology

## 2018-03-03 ENCOUNTER — Encounter: Payer: Self-pay | Admitting: Hematology & Oncology

## 2018-03-03 NOTE — Telephone Encounter (Signed)
Feel free to respond to her message by email.  I did see her elevated norepinephrine. She has been diagnosed with hyperandrenergic dysautonomia.  I reviewed her CT abdomen/pelvis from January and her chest xrays. There are no signs concerning for Pheochromocytoma on this imaging.  It would be helpful to repeat the serum catecholamines in the next six months to make sure they are not rising (which would make further imaging reasonable). Otherwise, I would assume this test result is related to the given diagnosis. I don't recommend any work up other than a repeat catecholamine/norepinephrine test near the end of the year.

## 2018-03-04 ENCOUNTER — Encounter (INDEPENDENT_AMBULATORY_CARE_PROVIDER_SITE_OTHER): Payer: Self-pay | Admitting: Family Medicine

## 2018-03-04 ENCOUNTER — Ambulatory Visit (INDEPENDENT_AMBULATORY_CARE_PROVIDER_SITE_OTHER): Payer: Self-pay | Admitting: Family Medicine

## 2018-03-04 ENCOUNTER — Ambulatory Visit (INDEPENDENT_AMBULATORY_CARE_PROVIDER_SITE_OTHER): Payer: 59 | Admitting: Family Medicine

## 2018-03-04 ENCOUNTER — Other Ambulatory Visit (FREE_STANDING_LABORATORY_FACILITY): Payer: 59

## 2018-03-04 ENCOUNTER — Encounter (INDEPENDENT_AMBULATORY_CARE_PROVIDER_SITE_OTHER): Payer: Self-pay

## 2018-03-04 VITALS — BP 107/71 | HR 87 | Temp 99.0°F | Wt 141.0 lb

## 2018-03-04 DIAGNOSIS — Q796 Ehlers-Danlos syndrome: Secondary | ICD-10-CM

## 2018-03-04 DIAGNOSIS — C189 Malignant neoplasm of colon, unspecified: Secondary | ICD-10-CM

## 2018-03-04 DIAGNOSIS — Q7962 Hypermobile Ehlers-Danlos syndrome: Secondary | ICD-10-CM

## 2018-03-04 DIAGNOSIS — R790 Abnormal level of blood mineral: Secondary | ICD-10-CM

## 2018-03-04 DIAGNOSIS — D8940 Mast cell activation, unspecified: Secondary | ICD-10-CM | POA: Insufficient documentation

## 2018-03-04 LAB — CBC WITH MANUAL DIFFERENTIAL
Absolute NRBC: 0 10*3/uL (ref 0.00–0.00)
Band Neutrophils Absolute: 0 10*3/uL (ref 0.00–1.00)
Band Neutrophils: 0 %
Basophils Absolute Manual: 0 10*3/uL (ref 0.00–0.08)
Basophils Manual: 0 %
Cell Morphology: NORMAL
Eosinophils Absolute Manual: 0.1 10*3/uL (ref 0.00–0.44)
Eosinophils Manual: 1 %
Hematocrit: 42.3 % (ref 34.7–43.7)
Hgb: 13.3 g/dL (ref 11.4–14.8)
Lymphocytes Absolute Manual: 0.73 10*3/uL (ref 0.42–3.22)
Lymphocytes Manual: 7 %
MCH: 29 pg (ref 25.1–33.5)
MCHC: 31.4 g/dL — ABNORMAL LOW (ref 31.5–35.8)
MCV: 92.2 fL (ref 78.0–96.0)
MPV: 10 fL (ref 8.9–12.5)
Monocytes Absolute: 0.63 10*3/uL (ref 0.21–0.85)
Monocytes Manual: 6 %
Neutrophils Absolute Manual: 9 10*3/uL — ABNORMAL HIGH (ref 1.10–6.33)
Nucleated RBC: 0 /100 WBC (ref 0.0–0.0)
Platelet Estimate: INCREASED — AB
Platelets: 358 10*3/uL — ABNORMAL HIGH (ref 142–346)
RBC: 4.59 10*6/uL (ref 3.90–5.10)
RDW: 13 % (ref 11–15)
Segmented Neutrophils: 86 %
WBC: 10.46 10*3/uL — ABNORMAL HIGH (ref 3.10–9.50)

## 2018-03-04 NOTE — Addendum Note (Signed)
Addended by: Karen Chafe on: 03/04/2018 02:34 PM     Modules accepted: Orders

## 2018-03-04 NOTE — Progress Notes (Signed)
Subjective:      Patient ID: Teresa [name] is a 47 y.o. female     No chief complaint on file.   CC: f/u    HPI   Patient is here with updates on her medical conditions.    She was diagnosed with mass cell activation syndrome.    She will see a hematologist and possibly get an iron infusion.  She had an iron study with results pending.  She is currently taking oral iron.  Despite in making her sick.  There is a possibility of low stomach acid causing difficulty with iron absorption.  She will be having a gastrointestinal motility and swallow study to further evaluate this.    She had some knee pain after having a DEXA scan done.  She saw an orthopedist who thought that she had an old injury that was exacerbated by positioning for the scan.  She had an MRI subsequently.  She will be seeing physical therapy in July.    Although we had filled out copious paperwork for her employment, her employer needs her to travel, and this is difficult for her with her Ehlers-Danlos.  She is thinking that she will likely needing disability at some point.    20 min spent with pt of which over half in discussion regarding her conditions.     The following sections were reviewed this encounter by the provider:   Allergies  Meds  Problems         Review of Systems   Weight gain 20+lbs.       BP 107/71 (BP Site: Left arm, Patient Position: Sitting, Cuff Size: Medium)   Pulse 87   Temp 99 F (37.2 C) (Oral)   Wt 64 kg (141 lb)   SpO2 98%   BMI 25.79 kg/m      Objective:     Physical Exam   Constitutional: She is oriented to person, place, and time. She appears well-developed and well-nourished.   HENT:   Jaw strap in place     Neurological: She is alert and oriented to person, place, and time.   Skin: Skin is warm and dry.   Psychiatric: She has a normal mood and affect. Her behavior is normal.   Nursing note and vitals reviewed.      Assessment:     1. Ehlers-Danlos, hypermobile type    2. Low ferritin        Plan:      There are no Patient Instructions on file for this visit.    [name], MD

## 2018-03-04 NOTE — Addendum Note (Signed)
Addended by: ZELAES, ELIANA E. on: 03/04/2018 02:34 PM     Modules accepted: Orders

## 2018-03-04 NOTE — Telephone Encounter (Signed)
Message sent to patient via MyChart. 

## 2018-03-04 NOTE — Progress Notes (Signed)
Have you seen any specialists/other providers since your last visit with us?    Yes    Arm preference verified?   Yes    The patient is due for nothing at this time, HM is up-to-date.

## 2018-03-05 ENCOUNTER — Encounter (INDEPENDENT_AMBULATORY_CARE_PROVIDER_SITE_OTHER): Payer: Self-pay

## 2018-03-05 LAB — CA 125: CA 125: 12.1 (ref 0.0–35.0)

## 2018-03-05 LAB — COMPREHENSIVE METABOLIC PANEL
ALT: 21 U/L (ref 0–55)
AST (SGOT): 24 U/L (ref 5–34)
Albumin/Globulin Ratio: 1.2 (ref 0.9–2.2)
Albumin: 4 g/dL (ref 3.5–5.0)
Alkaline Phosphatase: 104 U/L (ref 37–106)
BUN: 18 mg/dL (ref 7.0–19.0)
Bilirubin, Total: 0.4 mg/dL (ref 0.2–1.2)
CO2: 28 mEq/L (ref 21–29)
Calcium: 9.7 mg/dL (ref 8.5–10.5)
Chloride: 102 mEq/L (ref 100–111)
Creatinine: 0.7 mg/dL (ref 0.4–1.5)
Globulin: 3.3 g/dL (ref 2.0–3.7)
Glucose: 92 mg/dL (ref 70–100)
Potassium: 4.4 mEq/L (ref 3.5–5.1)
Protein, Total: 7.3 g/dL (ref 6.0–8.3)
Sodium: 141 mEq/L (ref 136–145)

## 2018-03-05 LAB — GFR: EGFR: >60

## 2018-03-05 LAB — CEA: CEA: 2.1 ng/mL (ref 0.0–5.0)

## 2018-03-05 LAB — HEMOLYSIS INDEX: Hemolysis Index: 7 (ref 0–18)

## 2018-03-08 ENCOUNTER — Encounter (INDEPENDENT_AMBULATORY_CARE_PROVIDER_SITE_OTHER): Payer: Self-pay | Admitting: Family Medicine

## 2018-03-11 ENCOUNTER — Encounter (INDEPENDENT_AMBULATORY_CARE_PROVIDER_SITE_OTHER): Payer: Self-pay

## 2018-03-12 ENCOUNTER — Other Ambulatory Visit (INDEPENDENT_AMBULATORY_CARE_PROVIDER_SITE_OTHER): Payer: Self-pay

## 2018-03-12 MED ORDER — MONTELUKAST SODIUM 4 MG PO PACK
8.00 mg | PACK | Freq: Every morning | ORAL | 1 refills | Status: DC
Start: 2018-03-12 — End: 2018-04-29

## 2018-03-15 ENCOUNTER — Encounter (INDEPENDENT_AMBULATORY_CARE_PROVIDER_SITE_OTHER): Payer: Self-pay | Admitting: Family Medicine

## 2018-03-15 DIAGNOSIS — M25362 Other instability, left knee: Secondary | ICD-10-CM | POA: Insufficient documentation

## 2018-03-15 DIAGNOSIS — Q796 Ehlers-Danlos syndrome, unspecified: Secondary | ICD-10-CM | POA: Insufficient documentation

## 2018-03-16 ENCOUNTER — Ambulatory Visit (INDEPENDENT_AMBULATORY_CARE_PROVIDER_SITE_OTHER): Payer: 59 | Admitting: Family Medicine

## 2018-03-16 ENCOUNTER — Encounter (INDEPENDENT_AMBULATORY_CARE_PROVIDER_SITE_OTHER): Payer: Self-pay | Admitting: Family Medicine

## 2018-03-16 ENCOUNTER — Ambulatory Visit: Payer: 59 | Admitting: Dermatology

## 2018-03-16 ENCOUNTER — Encounter: Payer: Self-pay | Admitting: Hematology & Oncology

## 2018-03-16 VITALS — BP 117/73 | HR 74 | Temp 98.4°F | Wt 140.6 lb

## 2018-03-16 DIAGNOSIS — Q7962 Hypermobile Ehlers-Danlos syndrome: Secondary | ICD-10-CM

## 2018-03-16 DIAGNOSIS — Q796 Ehlers-Danlos syndrome: Secondary | ICD-10-CM

## 2018-03-16 DIAGNOSIS — M2550 Pain in unspecified joint: Secondary | ICD-10-CM

## 2018-03-16 DIAGNOSIS — G8929 Other chronic pain: Secondary | ICD-10-CM

## 2018-03-16 NOTE — Patient Instructions (Signed)
Let us have you see the physical therapist for a functional capacity eval.  Let me know of any disability paperwork that you need filled out.

## 2018-03-16 NOTE — Progress Notes (Signed)
Have you seen any specialists/other providers since your last visit with us?    Yes    Arm preference verified?   Yes    The patient is due for nothing at this time, HM is up-to-date.

## 2018-03-16 NOTE — Progress Notes (Signed)
Subjective:      Patient ID: Teresa [name] is a 47 y.o. female     Chief Complaint   Patient presents with   . Follow-up        HPI   Patient is here for follow-up.  She has not been able to find a position with her current employer, they have not been offering her any positions that do not require travel.  She will send me a document that states that at her current position, she may be needed to travel at the behest of her employer.  She has found travel very difficult, was planning a trip to [place], but from the train journey to be very uncomfortable.    She is considering disability.  Her partners have noticed that it has become very difficult for her to take care of herself, and they are questioning her ability to perform a job.  We discussed this today, it does not seem like her current employer is able to give her any position that she would be able to do, so disability is what we will need to pursue now.  She has an attorney who will be helping her with this case.  We also discussed doing a functional capacity evaluation to support her case, which she is interested in.    The following sections were reviewed this encounter by the provider:   Allergies  Meds  Problems         Review of Systems   Ongoing chronic joint pain.      BP 117/73 (BP Site: Left arm, Patient Position: Sitting, Cuff Size: Medium)   Pulse 74   Temp 98.4 F (36.9 C) (Oral)   Wt 63.8 kg (140 lb 9.6 oz)   SpO2 98%   BMI 25.72 kg/m      Objective:     Physical Exam   Constitutional: She is oriented to person, place, and time. She appears well-developed and well-nourished.   HENT:   Jaw strap     Neurological: She is alert and oriented to person, place, and time.   Skin: Skin is warm and dry.   Psychiatric: She has a normal mood and affect. Her behavior is normal.   Nursing note and vitals reviewed.      Assessment:     1. Ehlers-Danlos, hypermobile type  - Ambulatory referral to Physical Therapy    2. Chronic pain  of multiple joints  - Ambulatory referral to Physical Therapy        Plan:     Patient Instructions   Let us have you see the physical therapist for a functional capacity eval.  Let me know of any disability paperwork that you need filled out.      [name], MD

## 2018-03-17 ENCOUNTER — Ambulatory Visit
Admission: RE | Admit: 2018-03-17 | Discharge: 2018-03-17 | Disposition: A | Discharge status: Home / Self Care | Payer: 59 | Source: Ambulatory Visit | Attending: Physician Assistant | Admitting: Physician Assistant

## 2018-03-17 DIAGNOSIS — R110 Nausea: Secondary | ICD-10-CM | POA: Insufficient documentation

## 2018-03-17 DIAGNOSIS — R1012 Left upper quadrant pain: Secondary | ICD-10-CM | POA: Insufficient documentation

## 2018-03-17 MED ORDER — TECHNETIUM TC 99M SULFUR COLLOID
1.10 | Freq: Once | Status: AC | PRN
Start: 2018-03-17 — End: 2018-03-17
  Administered 2018-03-17: 1.1 via ORAL
  Filled 2018-03-17: qty 20

## 2018-03-19 ENCOUNTER — Encounter (INDEPENDENT_AMBULATORY_CARE_PROVIDER_SITE_OTHER): Payer: Self-pay | Admitting: Family Medicine

## 2018-03-22 ENCOUNTER — Encounter (INDEPENDENT_AMBULATORY_CARE_PROVIDER_SITE_OTHER): Payer: Self-pay

## 2018-03-23 ENCOUNTER — Ambulatory Visit: Payer: 59

## 2018-03-24 ENCOUNTER — Encounter (INDEPENDENT_AMBULATORY_CARE_PROVIDER_SITE_OTHER): Payer: Self-pay | Admitting: Family Medicine

## 2018-03-24 ENCOUNTER — Encounter (INDEPENDENT_AMBULATORY_CARE_PROVIDER_SITE_OTHER): Payer: Self-pay

## 2018-03-24 ENCOUNTER — Ambulatory Visit (INDEPENDENT_AMBULATORY_CARE_PROVIDER_SITE_OTHER): Payer: 59 | Admitting: Neurology

## 2018-03-24 DIAGNOSIS — M542 Cervicalgia: Secondary | ICD-10-CM

## 2018-03-24 DIAGNOSIS — R200 Anesthesia of skin: Secondary | ICD-10-CM

## 2018-03-24 DIAGNOSIS — R202 Paresthesia of skin: Secondary | ICD-10-CM

## 2018-03-24 NOTE — Progress Notes (Signed)
Electrodiagnostic studies were performed today in the clinic. See full report in chart for details.

## 2018-03-25 ENCOUNTER — Other Ambulatory Visit: Payer: Self-pay | Admitting: Physician Assistant

## 2018-03-25 DIAGNOSIS — R110 Nausea: Secondary | ICD-10-CM

## 2018-03-25 DIAGNOSIS — R1310 Dysphagia, unspecified: Secondary | ICD-10-CM

## 2018-03-26 ENCOUNTER — Ambulatory Visit
Admission: RE | Admit: 2018-03-26 | Discharge: 2018-03-26 | Disposition: A | Discharge status: Home / Self Care | Payer: 59 | Source: Ambulatory Visit | Attending: Physician Assistant | Admitting: Physician Assistant

## 2018-03-26 ENCOUNTER — Encounter (INDEPENDENT_AMBULATORY_CARE_PROVIDER_SITE_OTHER): Payer: Self-pay | Admitting: Family Medicine

## 2018-03-26 ENCOUNTER — Ambulatory Visit: Payer: 59

## 2018-03-26 DIAGNOSIS — R1310 Dysphagia, unspecified: Secondary | ICD-10-CM | POA: Insufficient documentation

## 2018-03-26 DIAGNOSIS — R110 Nausea: Secondary | ICD-10-CM | POA: Insufficient documentation

## 2018-03-26 MED ORDER — BARIUM SULFATE 40 % PO SUSP
1.00 mL | Freq: Once | ORAL | Status: AC | PRN
Start: 2018-03-26 — End: 2018-03-26
  Administered 2018-03-26: 12:00:00 15 mL via ORAL

## 2018-03-26 MED ORDER — BARIUM SULFATE 40% ORAL SUSPENSION
1.00 mL | Freq: Once | Status: DC | PRN
Start: 2018-03-26 — End: 2018-03-26

## 2018-03-26 MED ORDER — BARIUM SULFATE 40 % PO SUSR
1.00 mL | Freq: Once | ORAL | Status: AC | PRN
Start: 2018-03-26 — End: 2018-03-26
  Administered 2018-03-26: 12:00:00 70 mL via ORAL

## 2018-03-26 MED ORDER — BARIUM SULFATE 40 % PO PSTE
1.00 mL | PASTE | Freq: Once | ORAL | Status: AC | PRN
Start: 2018-03-26 — End: 2018-03-26
  Administered 2018-03-26: 12:00:00 30 mL via ORAL

## 2018-03-26 NOTE — SLP Eval Note (Addendum)
[facility]   Speech and Language Therapy Evaluation     Videofluoroscopic Swallowing Study Report    Patient: Teresa [name]    MRN#: [id number]     Plan/Recommendations:   Diet/liquid recommendations:  Diet Solids Recommendation: Continue with current diet, mechanical soft  Diet Liquids Recommendations: thin consistency, from a cup  Recommended Form of Meds: PO, whole, with liquid, with puree     Precautions:   Precautions/Compensations: Awake/alert, Upright 90 degrees for all oral intake, Alternate solids and liquids, Chin support, Swallow multiple times per bite/sip  Suggestions for Feeding: independent  Recommendation Discussed With: : Patient    Plan:   Plan: begin/continue oral diet, patient/family education             Recommendations: Outpatient speech    Assessment:   Teresa [name] is a 47 y.o. female admitted 03/26/2018 for Dysphagia, unspecified type [R13.10]  Nausea [R11.0] presenting with mild oropharyngeal dysphagia. Pt with hx of dysphagia for ~10 years and patient denies signs of progression or recent respiratory illness but never received intervention for mild dysphagia. 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 Pt demo slow yet effective mastication with soft solids. Pt with timely A/P transfer with mild lingual residue with puree and solids which she clears independently with a spontaneous swallow. Pharyngeal response appears timely with mildly reduced hyolaryngeal elevation/excursion. Pt with resulting trace pyriform/vallecular retention and coating of the aryepiglottic folds with liquids and mild retention with puree and solids. Pt is able to partially clear with a second spontaneous swallow and/or liquid wash. All PO passes through PES without difficulty. No penetration or aspiration identified. Pt was  indicating some globus sensation and pointing to base of tongue.  The Radiologist kindly performed an esophageal sweep in A/P view and followed barium contrast into the gastric body. At this time pt appears to be managing soft diet with thin liquids and advised to continue current diet. Pt should continue with independent compensatory strategies using additional cleansing swallow or liquid wash as needed. Would recommend considering outpatient SLP follow-up on a trial basis to initiate a home exercise program to address pharyngeal strengthening.        Referring Physician: Jannifer Rodney, [facility]  Radiologist: Dr. Arvilla Market      History of Present Illness:   Medical Diagnosis: Dysphagia, unspecified type [R13.10]  Nausea [R11.0]    Therapy Diagnosis:  oropharyngeal dysphagia    Chief Complaint: Pt presents today with an ~10 year hx of dysphagia. Pt has a complex medical history including Hypermobile Ehlers-Danlos Syndrome and dysautonomia. Pt reports significant difficulty with dry tough solids which will become lodged and reports intermittent signs of aspiration with both liquids and solids intermittently. She previously had a VFSS completed at [facility] in ~2010 which reported dysphagia but patient was unable to fully elaborate on findings. Pt received today wearing a cervical collar and wrap as her mandible easily dislocates and will hang. Pt wears the collar and wrap for mandibular support. Pt reports many dietary restrictions and avoids many foods. Pt describes consuming a mechanical soft diet.      Past Medical/Surgical History:  Past Medical History:   Diagnosis Date   . ADHD (attention deficit hyperactivity disorder)    . Anemia     constant   . Anxiety    . Arrhythmia  pt states she belives anemia led to heart flutters in the past.   . Asthma    . Depression    . Dysphagia    . Eczema    . Fibromyalgia    . Gastroesophageal reflux disease    . Headache     migraine, sensitivity to light   . Heart burn    .  Hypercholesterolemia 12/24/2015   . Malignant neoplasm of colon 2009    chemo   . Malignant neoplasm of skin 2007    basal   . Neck pain    . Osteoporosis, idiopathic     Not sure.  Have had osteopenia in past but not now.   . Post-operative nausea and vomiting    . Psoriasis    . Seasonal allergic rhinitis    . Sleep apnea     does not use CPAP      Past Surgical History:   Procedure Laterality Date   . APPENDECTOMY     . COLECTOMY  2009    partial   . COLONOSCOPY  2014   . COLONOSCOPY N/A 11/13/2014    Procedure: COLONOSCOPY;  Surgeon: Huntley Estelle, MD;  Location: [id number];  Service: Gastroenterology;  Laterality: N/A;   . EGD  2014   . EGD, DILATION N/A 11/13/2014    Procedure: EGD, DILATION;  Surgeon: Huntley Estelle, MD;  Location: [id number];  Service: Gastroenterology;  Laterality: N/A;  EGD, DILATION/COLONOSCOPY W/IVA   . REDUCTION MAMMAPLASTY  2006   . uterine ablation   2011         Pain: 4/10 all over; pt reports it is a chronic condition that she will manage  History/Current Status  Risks/Benefits of Evaluation Discussed With: : patient  Respiratory Status: within normal limits  Behavior/Mental Status: Awake/alert, Able to follow directions, Cooperative, Pleasant mood  Nutrition: oral  Diet Prior to Study: mechanical soft, thin liquids  Baseline Vocal Quality: Normal       Oral Motor Skills  Oral Motor Skills: exceptions to [facility]  Oral Motor Impairments: ROM (mandibule easily dislocates. Pt wears wrap)    VFSS Results - Clinical Observations:   Swallow Study procedure:  Position: standing     Plane of Reference: Lateral, AP    Presentations:   Liquids:    Thin: 70 cc via Spoon, Cup and Straw   Nectar: 15 cc via Spoon and Cup   Puree: 30 cc     Solid: corn cake that pt provided from home due to Gluten allergy and dietary restrictions     Stages of Swallow  Oral Stage: mastication reduced, effective       Oral Stage Residuals: Lingual  Lingual Residuals: Cleared, Spontaneous swallow  Pharyngeal  Stage: reduced laryngeal excursion       Pharyngeal Stage Residuals: Vallecular, Pyriform  Esophageal Stage: unremarkable  Consistencies: Liquids, Solids  Liquids: Thin, Nectar  Solids: Regular, Puree      Airway Protection  Risk for Aspiration: None    Penetration/Aspiration Scale:  Thin: 1: Material does not enter airway.  Nectar: 1: Material does not enter airway.  Puree: 1: Material does not enter airway.  Solid: 1: Material does not enter airway.    Compensatory Strategies  Effective Compensatory Strategies Assessed: chin support, liquid assist, multiple swallows (pt uses wrap toaid w/chin support)    Patient/Family Education: verbal    Goals:  Long Term Goal(s) to be completed by 06/26/2018 or by discharge from SLP services:    1. Pt  will demonstrate use of recommended strategies with safest and least restrictive diet consistency with functional independence and no clinical concern for dysphagia-related complications.  2. Pt will participate in further education, HEP, evaluation, and treatment as clinically indicated throughout course of treatment.    Short Term Goal(s) to be completed by 06/25/2018 or within 10 visits:    1. Pt will demonstrate use of effortful swallow across 10 trials of mechanical soft solids without significant clinical concern for aspiration or dysphagia-related complications.  2. Pt will verbalize understanding of results and recommendations from VFSS.   3. Pt will verbalize understanding of s/s of aspiration and 3 pillars of aspiration pneumonia and aspiration pneumonia prevention.   4. Pt will complete pharyngeal strengthening exercise program with 100% acc independently.       Marcia Brash MS, CCC-SLP    Time of Treatment:  SLP Received On: 03/26/18  Start Time: 1055  Stop Time: 1127  Time Calculation (min): 32 min    [facility]  [address]  [place], [place] [address]  [phone] Tel  [phone] Fax    Physician Agreement of Patient Plan of  Care    Date: 03/26/18    Dear Jannifer Rodney, PAC :    Thank you for allowing [place] to participate in the care of [facility] Enclosed is the Physical/Occupational/Speech Therapy Evaluation and Plan of Care for this patient.     Regulations from the Center for Medicare and Medicaid Services (CMS) require your review and approval of this plan of care. Please note that since a prescription or order for therapy does not legally constitute approval of plan of care, we will not be able to proceed with therapy until we receive your approval of the plan of care.     Please sign and date the Physician Certification Statement below and fax this document back to [place] within 3 business days of receipt so we can initiate the therapy treatment plan.     Sincerely,     Duke Salvia  Director of Rehabilitation Services    [facility]  [facility]  Rehabilitation Department    Physician Certification  I have read and agreed with the plan of care for the above named patient who is under my care.     Diagnosis: Dysphagia, unspecified type [R13.10]  Nausea [R11.0]    Therapy Diagnosis: oropharyngeal dysphagia    Frequency: 1  Times per week for    10        Weeks    Certification Period: 03/26/2018-06/26/2018    Physician Signature: ________________________________ Date: __________    Physician NPI: _____________________________    Please fax completed to [facility], Department of Rehabilitation at [phone]

## 2018-03-29 ENCOUNTER — Encounter (INDEPENDENT_AMBULATORY_CARE_PROVIDER_SITE_OTHER): Payer: Self-pay

## 2018-03-30 ENCOUNTER — Ambulatory Visit (INDEPENDENT_AMBULATORY_CARE_PROVIDER_SITE_OTHER): Payer: Self-pay | Admitting: Family Medicine

## 2018-03-30 ENCOUNTER — Encounter (INDEPENDENT_AMBULATORY_CARE_PROVIDER_SITE_OTHER): Payer: Self-pay | Admitting: Neurology

## 2018-03-31 ENCOUNTER — Encounter (INDEPENDENT_AMBULATORY_CARE_PROVIDER_SITE_OTHER): Payer: Self-pay | Admitting: Family Medicine

## 2018-03-31 ENCOUNTER — Ambulatory Visit (INDEPENDENT_AMBULATORY_CARE_PROVIDER_SITE_OTHER): Payer: 59 | Admitting: Family Medicine

## 2018-03-31 VITALS — BP 128/74 | HR 71 | Temp 98.8°F | Wt 138.0 lb

## 2018-03-31 DIAGNOSIS — Q7962 Hypermobile Ehlers-Danlos syndrome: Secondary | ICD-10-CM

## 2018-03-31 DIAGNOSIS — G8929 Other chronic pain: Secondary | ICD-10-CM

## 2018-03-31 DIAGNOSIS — Q796 Ehlers-Danlos syndrome: Secondary | ICD-10-CM

## 2018-03-31 DIAGNOSIS — R6884 Jaw pain: Secondary | ICD-10-CM

## 2018-03-31 NOTE — Patient Instructions (Signed)
We talked about your FMLA form.  Please have your specialists  Contribute a blurb so that I can put together all of their findings.  Find out if you need continuous or intermittent leave.

## 2018-03-31 NOTE — Progress Notes (Signed)
Subjective:      Patient ID: Teresa [name] is a 47 y.o. female     No chief complaint on file.   CC: followup    HPI     Patient presents with several updates.    She has seen the orthopedist, expects knee improvement in 6 weeks.  She has been doing aquatic physical therapy at [facility], which has been helpful.  Her orthopedist does not think that her temporomandibular joint and shoulders will improve any more.    She saw an EDS specialist, [name], who is in the same office as [name].  She thought that this visit was good.  She was told to have a 10 pound weight limit.  She was told to expect flares that last 2-4 days.  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  She was told to increase her salt, increase selenium, and use an abdominal binder for support.    She has seen her pain specialist.  She will be started on Celebrex and amitriptyline.  There may be an injection if she does not improve on these medications.    At the last visit, we had briefly discussed a functional capacity evaluation.  Her other specialists feel that the risks are too high to complete this evaluation.  They are happy to provide notes in support of her disability pursuit.    She is going to pursue disability.  She comes in with an FMLA form.    15 min spent with patient in discussion regarding her conditions.     The following sections were reviewed this encounter by the provider:        Review of Systems       BP 128/74 (BP Site: Left arm, Patient Position: Sitting, Cuff Size: Medium)   Pulse 71   Temp 98.8 F (37.1 C) (Oral)   Wt 62.6 kg (138 lb)   SpO2 98%   BMI 25.24 kg/m      Objective:     Physical Exam   Constitutional: She is oriented to person, place, and time. She appears well-developed and well-nourished.   HENT:   Jaw brace, neck brace   Neurological: She is alert and oriented to person, place, and time.   Psychiatric: She has a normal mood and affect. Her behavior is  normal.   Nursing note and vitals reviewed.      Assessment:     1. Ehlers-Danlos, hypermobile type    2. Chronic jaw pain        Plan:     Patient Instructions   We talked about your FMLA form.  Please have your specialists  Contribute a blurb so that I can put together all of their findings.  Find out if you need continuous or intermittent leave.      [name], MD

## 2018-03-31 NOTE — Progress Notes (Signed)
Have you seen any specialists/other providers since your last visit with [place]?    Yes, orthopedics, PT, Pain management, EDS    Arm preference verified?   Yes    The patient is due for nothing at this time, HM is up-to-date.

## 2018-04-01 ENCOUNTER — Encounter (INDEPENDENT_AMBULATORY_CARE_PROVIDER_SITE_OTHER): Payer: Self-pay

## 2018-04-01 ENCOUNTER — Encounter (INDEPENDENT_AMBULATORY_CARE_PROVIDER_SITE_OTHER): Payer: Self-pay | Admitting: Family Medicine

## 2018-04-05 ENCOUNTER — Encounter (INDEPENDENT_AMBULATORY_CARE_PROVIDER_SITE_OTHER): Payer: Self-pay | Admitting: Family Medicine

## 2018-04-07 ENCOUNTER — Encounter (INDEPENDENT_AMBULATORY_CARE_PROVIDER_SITE_OTHER): Payer: Self-pay | Admitting: Family Medicine

## 2018-04-08 ENCOUNTER — Ambulatory Visit (INDEPENDENT_AMBULATORY_CARE_PROVIDER_SITE_OTHER): Payer: 59 | Admitting: Neurology

## 2018-04-08 ENCOUNTER — Encounter (INDEPENDENT_AMBULATORY_CARE_PROVIDER_SITE_OTHER): Payer: Self-pay | Admitting: Neurology

## 2018-04-08 VITALS — BP 107/72 | HR 81 | Ht 61.0 in | Wt 138.2 lb

## 2018-04-08 DIAGNOSIS — G2581 Restless legs syndrome: Secondary | ICD-10-CM

## 2018-04-08 DIAGNOSIS — R202 Paresthesia of skin: Secondary | ICD-10-CM

## 2018-04-08 DIAGNOSIS — G4733 Obstructive sleep apnea (adult) (pediatric): Secondary | ICD-10-CM

## 2018-04-08 DIAGNOSIS — G629 Polyneuropathy, unspecified: Secondary | ICD-10-CM

## 2018-04-08 DIAGNOSIS — G43119 Migraine with aura, intractable, without status migrainosus: Secondary | ICD-10-CM

## 2018-04-08 NOTE — Patient Instructions (Signed)
Thank you for visiting the [facility].  It was a pleasure seeing you today!      Our Plan:  -I've ordered a sleep study    -No medication changes from me at this time    -Follow up after sleep study      Results:  If you have completed any imaging, labs, or other neurological testing, I will always go over your results with you at your follow up visit. If you would like to access your results prior to that, please sign up for My Chart access at home.     Patient Satisfaction Survey:  If you receive a patient satisfaction survey, I would greatly appreciate it if you would complete it.     We value your feedback! Let me know if there are things we could have done better for your office visit.    Contact Me Online:  [facility] now has a patient portal called MyChart.  Please considering signing up for MyChart.  This is the best way to communicate with me, including getting test results, requesting medication refills, or for questions and concerns that come up between our office visits.     Of note, MyChart should not be used if you are having a medical emergency -- in that case call 911.      Follow Up Appointments:  We allocate your appointment time specifically for you and your needs.  Please be courteous and call the office at least 24 hours in advance if you are unable to make it to your appointment and you need to reschedule.      New Medications and Refills:  I recommend that you call the pharmacy before picking up any medications that were called in or electronically faxed during your office visit in order to verify that your prescription was received and that it is ready for pick up.     Medication Side Effects/Reactions:   Stop taking your medication if you are concerned that you may be having an allergic reaction or if you are having significant side effects.  Please call the office ASAP to let me know that you are having problems with the medication.     For Next Visit:  Please  remember to bring your medication bottles or an up-to-date medication list to your next visit.     Thank you for entrusting me with your health and for giving me the privilege of caring for you today!

## 2018-04-08 NOTE — Progress Notes (Signed)
04/08/2018    Chief Complaint:   Chief Complaint   Patient presents with   . Headache     History of Present Illness:  Teresa [name] is a 47 y.o. right-handed female who presents for follow up of multiple neurological complaints.  She was previously seen on 01/15/2018.  At that time, I recommended evaluation with Dr. Roque Cash for her cervical dystonia and tremors, nerve conduction study, and some additional supplements for migraine prophylaxis.     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   Getting TMJ treatment with appliances but this is making her suboccipital pain worse.   She was not able tolerate CoQ10 due to worsening headaches.   No effect noted from B2 but she is continuing it.   Her pain management doctor is suggesting SPG block for her head and face pain.     Hematologist is recommending follow up sleep study to reassess restless legs.   Last sleep study>5 years ago.     Needing to take leave from work as unable to complete her work related responsibilities due to symptoms.     She will be starting Amitriptyline soon so wants to hold off on starting any additional medications at this time.    Initial Visit History 01/15/2018:  Head pulls back and to the left.  Has associated neck pain and stiffness.    Makes it hard for her to sleep.   She had Botox injections at [facility] x 3.   First two, helped a little.   Last time, had dose increase and "this was disastrous".   Has a history of RLS.   Thinks she may also have PLMS.   Since 2008, has felt tremors in her legs.   This is an internal sensation; feels like her legs are vibrating.   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   Previously diagnosed by migraines.   Had headaches early in her life while on OCPs in her 33s.   Headaches recurred in her 40s.   She always has low level nausea, scintillating scotomas, light sensitivity.   Severity of these  fluctuates.   Headaches consist of pain starting in her suboccipital region.   Radiates around her head.   Feels like a tightness, as if her brain was swollen or her scalp too tight.   These headaches were daily until she started wearing an ace bandage to help with jaw hypermobility.   Can progress to her feeling like a pressure over the vertex, like a bowling ball is sitting on top of her head.  This happens three times a week. Can last all day.   At times this progresses even further to feeling that she has railroad spike feeling over her left eye.  Can also occur on right or in the middle but less frequently.  These can be triggered by strawberries or tofu. Otherwise happens about 1 per week.  Lasts about an hour.   With these, face feels congested, left eye is twitching, has a droopy feeling.  Joint pain is severe.   No conjunctival tearing, ingestion, rhinorrhea.   Also gets "migraines", where she has these symptoms to a more severe degree and also has sound sensitivity. With these, she may need to lay down in dark room or she may be restless, "like I'm crawling out of my skin", and needs to pace. These occur maybe once every 6 months.  Better since  going off of Bupropion.   Headaches were previously helped by Tylenol but she had elevation in her liver enzymes, so she can no longer take that.   Sometimes, if she takes a Benadryl when she gets the swelling in her head feeling, it helps.   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   Using Xyzal, Atarax, Pepcid bid; started these in February or later.   Not sure if these are helping her headaches.   She is also seeing cardiology for dysautonomia.   Also noting some numbness, tingling in hands.   Worse on the right.   Lateral aspect of right elbow radiating into right 4th and 5th digits.   Makes it hard for her sleep.     Review of Systems:  Constitutional: Negative for fever. +Low energy level. +Weight change.   Respiratory: +Asthma.  +Shortness of breath.  +History of chronic bronchitis.   Cardiovascular: +Chest pain. +Irregular rhythm. +High blood pressure.  Gastrointestinal: Negative for abdominal pain. +Diarrhea. +Constipation.   Neurological: Negative for visual changes, alteration in awareness, speech or language problems, facial droop, focal weakness, limb incoordination,dysphagia, or dizziness. +Neck pain. ?Seizure. +Headaches. +Back pain. +Tremor. +Balance trouble. +Numbness/tingling. +Trouble sleeping. +RLS.   Psychiatric/Behavioral: +Depression.   Also: Cancer. Arthritis. Anxiety. Anemia. Bladder problem.  Rash. Hypermobile Ehlers-Danlos syndrome. Fibromyalgia. Migraines. Dysautonomia. 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 Bilateral tinnitus.  Pulsatile tinnitus in left ear.   All other systems reviewed and are negative except pertinent positives as noted above in HPI.     Past Medical History:  Past Medical History:   Diagnosis Date   . ADHD (attention deficit hyperactivity disorder)    . Anemia     constant   . Anxiety    . Arrhythmia     pt states she belives anemia led to heart flutters in the past.   . Asthma    . Depression    . Dysphagia    . Eczema    . Fibromyalgia    . Gastroesophageal reflux disease    . Headache     migraine, sensitivity to light   . Heart burn    . Hypercholesterolemia 12/24/2015   . Malignant neoplasm of colon 2009    chemo   . Malignant neoplasm of skin 2007    basal   . Neck pain    . Osteoporosis, idiopathic     Not sure.  Have had osteopenia in past but not now.   . Post-operative nausea and vomiting    . Psoriasis    . Seasonal allergic rhinitis    . Sleep apnea     does not use CPAP     Past Surgical History:  Past Surgical History:   Procedure Laterality Date   . APPENDECTOMY     . COLECTOMY  2009    partial   . COLONOSCOPY  2014   . COLONOSCOPY N/A 11/13/2014    Procedure: COLONOSCOPY;  Surgeon: Huntley Estelle, MD;  Location: [facility];  Service: Gastroenterology;   Laterality: N/A;   . EGD  2014   . EGD, DILATION N/A 11/13/2014    Procedure: EGD, DILATION;  Surgeon: Huntley Estelle, MD;  Location: [id number];  Service: Gastroenterology;  Laterality: N/A;  EGD, DILATION/COLONOSCOPY W/IVA   . REDUCTION MAMMAPLASTY  2006   . uterine ablation   2011     Allergies:  Codeine; Morphine; Silenor [doxepin]; Gabapentin; Advair diskus [fluticasone-salmeterol]; Bupropion; Ciprofloxacin; Egg white [albumen, egg]; Epinephrine; Ginger; Levaquin [  levofloxacin]; Soybean-containing drug products; Strawberry c [ascorbate]; Walnuts [tree nuts]; Dairy [milk-related compounds]; Eggs or egg-derived products; Ioversol; Lanolin; Lidocaine; Penicillins; and Wheat extract    Family History:  Family History   Problem Relation Age of Onset   . Colon cancer Father    . Cancer Father         colon   . Early death Father         Age 71, colon cancer   . Depression Mother    . Diabetes Mother         Pre-diabetes; not diabetes   . Heart disease Mother    . Hyperlipidemia Mother    . Vision loss Mother         cataracts and glaucoma   . Obesity Maternal Aunt    No other family history related to current complaints.    Social History:  Married.  On medical leave. No tobacco, alcohol, drug abuse.     Medications:  Current Outpatient Prescriptions   Medication Sig Dispense Refill   . albuterol (PROVENTIL HFA;VENTOLIN HFA) 108 (90 Base) MCG/ACT inhaler Inhale 2 puffs into the lungs every 4 (four) hours as needed for Wheezing. 1 Inhaler 0   . ascorbic acid (VITAMIN C) 500 MG tablet Take 625 mg by mouth daily.Using powder formulation     . betamethasone, augmented, (DIPROLENE) 0.05 % cream APPLY TWICE DAILY AS NEEDED TO THE AFFECTED AREAS FOR 5-7 DAYS FOR ECZEMA.  2   . Boswellia Serrata Extract Powder Take 150 mg by mouth daily.         . Calcium Citrate 200 MG Tab Take 400 mg by mouth 2 (two) times daily.Powder formulation         . cromolyn (GASTROCROM) 100 MG/5ML solution      . Cyanocobalamin (VITAMIN B-12  SL) Place under the tongue     . diphenhydrAMINE (BENADRYL) 12.5 MG chewable tablet Chew 12.5 mg by mouth 4 (four) times daily as needed.     Marland Kitchen EPINEPHrine 0.3 MG/0.3ML Solution Auto-injector injection INJECT ONE AUTO-INJECTOR INTRAMUSCULARLY AS NEEDED  4   . estradiol (ESTRACE) 0.1 MG/GM vaginal cream Place 1 g vaginally twice a week. 42.5 g 4   . ferrous sulfate 325 (65 FE) MG tablet Take 325 mg by mouth every morning with breakfast     . fexofenadine (ALLERGY 24-HR) 180 MG tablet Take 180 mg by mouth daily     . guaifenesin (ROBITUSSIN) 100 MG/5ML liquid Take 200 mg by mouth 4 (four) times daily as needed for Congestion.     . hydrOXYzine (ATARAX) 50 MG tablet Take 50 mg by mouth nightly     . L-Lysine 1000 MG Tab Take 1,300 mg by mouth daily.        Marland Kitchen L-Theanine 100 MG Cap Take 200 mg by mouth daily.         Marland Kitchen lactobacillus (CULTURELLE) Cap Take 1 capsule by mouth     . levocetirizine (XYZAL) 5 MG tablet Take 5 mg by mouth 2 (two) times daily         . Light Mineral Oil-Mineral Oil (RETAINE MGD) 0.5-0.5 % Emulsion Apply 1 drop to eye nightly.     . Magnesium Citrate 200 MG Tab Take by mouth     . Melatonin 1 MG/ML Liquid Take 0.5 drops by mouth nightly.     . montelukast (SINGULAIR) 4 MG Packet Take 2 packets (8 mg total) by mouth every morning 180 packet 1   .  PREVIDENT 5000 BOOSTER PLUS 1.1 % Paste APPLY TO TEETH AFTER REGULAR BRUSHING AND FLOSSING  0   . pseudoephedrine (SUDAFED) 30 MG tablet Take 30 mg by mouth every 4 (four) hours as needed for Congestion.     Marland Kitchen pyridostigmine (MESTINON) 60 MG tablet Take 30 mg by mouth 5 (five) times daily         . RESTASIS 0.05 % ophthalmic emulsion INSTILL 1 DROP TWICE DAILY IN BOTH EYES  3   . triamcinolone acetonide (NASACORT ALLERGY 24HR) 55 MCG/ACT nasal inhaler 1 spray by Nasal route daily.         . vitamin B-2 (RIBOFLAVIN) 100 MG Tab Take 4 tablets (400 mg total) by mouth daily 120 tablet 2   . vitamin D, cholecalciferol, 400 units tablet Take 400 Units by mouth  daily     . cloNIDine (CATAPRES) 0.1 MG tablet      . linaclotide (LINZESS) 72 MCG capsule Take by mouth daily       No current facility-administered medications for this visit.      General Exam:  BP 107/72 (BP Site: Right arm, Patient Position: Sitting, Cuff Size: Medium)   Pulse 81   Ht 1.549 m (5\' 1" )   Wt 62.7 kg (138 lb 3.2 oz)   BMI 26.11 kg/m   Gen:  Well-developed, well-nourished.  No acute distress.   HEENT:  Wearing hiking/bucket type hat.  Has ACE bandage wrapped around bottom of jaw up to vertex of head on both side. Slightly tinted glasses.  Otherwise NC/AT.  Neuro Exam:    MENTAL STATUS:  Awake, alert, oriented.  Attentive.  Follows commands.  Speech fluent, non-dysarthric, with normal naming.   CRANIAL NERVES:  PERRL.  VFF.  EOMI.  Face symmetric.  Hearing grossly intact.  Palate and tongue midline.   MOTOR:  Moves all extremities symmetrically antigravity.  No adventitious movements.   COORDINATION:  No dysmetria.   GAIT:  Normal gait and station.     Investigations:  Labs:  Lab Results   Component Value Date    WBC 10.46 (H) 03/04/2018    HGB 13.3 03/04/2018    HCT 42.3 03/04/2018    MCV 92.2 03/04/2018    PLT 358 (H) 03/04/2018     '  Chemistry        Component Value Date/Time    NA 141 03/04/2018 1436    K 4.4 03/04/2018 1436    CL 102 03/04/2018 1436    CO2 28 03/04/2018 1436    BUN 18.0 03/04/2018 1436    CREAT 0.7 03/04/2018 1436    GLU 92 03/04/2018 1436        Component Value Date/Time    CA 9.7 03/04/2018 1436    ALKPHOS 104 03/04/2018 1436    AST 24 03/04/2018 1436    ALT 21 03/04/2018 1436    BILITOTAL 0.4 03/04/2018 1436        Imaging:  MRI Cervical Spine Without Contrast 07/29/2017   (I personally reviewed source images and I agree with the radiologist's interpretation as noted below):  Impression:  1. No segmental instability identified.   2. Small disc herniations at the C3-4, C4-C5, and C7-T1 levels without stenosis.  3. Spondylotic change at C6-7 level with mild bilateral  neuroforaminal stenosis.     MRI Brain With and Without Contrast 11/25/2016   (I personally reviewed source images and I agree with the radiologist's interpretation as noted below):  IMPRESSION:   1. Normal MR  examination of the internal auditory canals and  cerebellopontine angles.  2. No acute intracranial abnormality. Minimal nonspecific white matter  changes with differential considerations as discussed.    Other Studies:  EMG/NCS 03/24/2018: Normal study.     Records Reviewed:  Office visit note by Dr. Sharlet Salina from 02/09/2018    Impression:  In summary, Teresa [name] is a 47 y.o. female with a complicated medical history who presents for follow up of multiple complaints including right>left upper extremity tingling, headaches, h/o cervical dystonia, RLS and h/o OSA.     Recommendations:  1. Intractable migraine with aura without status migrainosus  -Continue magnesium and riboflavin for migraine prevention  -Will be starting on amitriptyline for another condition; this can have migraine preventive effects  -Letter written for work    2. OSA (obstructive sleep apnea)  3. Restless legs  - General sleep study; Future    4. Paresthesia  5. Small fiber neuropathy  -Starting amitriptyline as noted above  -Diagnosed with small fiber neuropathy per patient via QSART testing  -Will continue to follow-up with her dysautonomia expert    Follow up with me after sleep study.     De Hollingshead, MD  [facility]  Board Certified in Adult Neurology by the American Board of Psychiatry and Neurology (ABPN)  Board Certified in Headache Medicine by the [employer] (UCNS)  04/08/2018

## 2018-04-12 ENCOUNTER — Encounter (INDEPENDENT_AMBULATORY_CARE_PROVIDER_SITE_OTHER): Payer: Self-pay

## 2018-04-13 ENCOUNTER — Encounter (INDEPENDENT_AMBULATORY_CARE_PROVIDER_SITE_OTHER): Payer: Self-pay | Admitting: Family Medicine

## 2018-04-13 NOTE — Progress Notes (Signed)
Patient pick-up forms today.

## 2018-04-15 ENCOUNTER — Encounter (INDEPENDENT_AMBULATORY_CARE_PROVIDER_SITE_OTHER): Payer: Self-pay | Admitting: Neurology

## 2018-04-15 DIAGNOSIS — G4733 Obstructive sleep apnea (adult) (pediatric): Secondary | ICD-10-CM

## 2018-04-16 ENCOUNTER — Encounter (INDEPENDENT_AMBULATORY_CARE_PROVIDER_SITE_OTHER): Payer: Self-pay | Admitting: Family Medicine

## 2018-04-19 ENCOUNTER — Encounter (INDEPENDENT_AMBULATORY_CARE_PROVIDER_SITE_OTHER): Payer: Self-pay | Admitting: Family Medicine

## 2018-04-19 ENCOUNTER — Encounter (INDEPENDENT_AMBULATORY_CARE_PROVIDER_SITE_OTHER): Payer: Self-pay | Admitting: Neurology

## 2018-04-21 ENCOUNTER — Ambulatory Visit (INDEPENDENT_AMBULATORY_CARE_PROVIDER_SITE_OTHER): Payer: Self-pay | Admitting: Family Medicine

## 2018-04-26 ENCOUNTER — Ambulatory Visit (INDEPENDENT_AMBULATORY_CARE_PROVIDER_SITE_OTHER): Payer: Self-pay | Admitting: Family Medicine

## 2018-04-26 ENCOUNTER — Ambulatory Visit: Payer: 59

## 2018-04-27 ENCOUNTER — Telehealth (INDEPENDENT_AMBULATORY_CARE_PROVIDER_SITE_OTHER): Payer: Self-pay | Admitting: Family Medicine

## 2018-04-27 NOTE — Telephone Encounter (Signed)
Spoke to pt's disability lawyer, Ellender Hose.  Explained I am not the treating physician for her medical conditions. He has reached out to the specialists.  Explained that I have already written some FMLA paperwork with the specialists input.  He asked me to hold onto the paperwork for now, he will wait for records from specialists.

## 2018-04-29 ENCOUNTER — Encounter (INDEPENDENT_AMBULATORY_CARE_PROVIDER_SITE_OTHER): Payer: Self-pay | Admitting: Family Medicine

## 2018-04-29 ENCOUNTER — Ambulatory Visit (INDEPENDENT_AMBULATORY_CARE_PROVIDER_SITE_OTHER): Payer: 59 | Admitting: Family Medicine

## 2018-04-29 VITALS — BP 117/75 | HR 84 | Temp 97.6°F | Resp 17 | Ht 61.5 in | Wt 137.0 lb

## 2018-04-29 DIAGNOSIS — G4700 Insomnia, unspecified: Secondary | ICD-10-CM

## 2018-04-29 DIAGNOSIS — T7840XD Allergy, unspecified, subsequent encounter: Secondary | ICD-10-CM

## 2018-04-29 DIAGNOSIS — Q796 Ehlers-Danlos syndrome, unspecified: Secondary | ICD-10-CM

## 2018-04-29 DIAGNOSIS — G8929 Other chronic pain: Secondary | ICD-10-CM

## 2018-04-29 DIAGNOSIS — R790 Abnormal level of blood mineral: Secondary | ICD-10-CM

## 2018-04-29 DIAGNOSIS — D8940 Mast cell activation, unspecified: Secondary | ICD-10-CM

## 2018-04-29 MED ORDER — AMITRIPTYLINE HCL 10 MG PO TABS
20.00 mg | ORAL_TABLET | Freq: Every evening | ORAL | 1 refills | Status: DC
Start: 2018-04-29 — End: 2018-05-25

## 2018-04-29 MED ORDER — MONTELUKAST SODIUM 10 MG PO TABS
10.00 mg | ORAL_TABLET | Freq: Every day | ORAL | 1 refills | Status: AC
Start: 2018-04-29 — End: 2018-07-28

## 2018-04-29 NOTE — Patient Instructions (Signed)
Try using the lower dose of amitriptyline and the slightly higher dose of Singulair.  Let me know how it goes.  I will try to get your records from [name].

## 2018-04-29 NOTE — Progress Notes (Addendum)
Subjective:      Patient ID: Teresa [name] is a 47 y.o. female     Chief Complaint   Patient presents with   . Employment Physical     discus disabilly        HPI     Generalized joint pain is improved with cromolyn for mast cell. Knees are improving, can walk up stairs or walk for 30 min with PT, using knee brace. Going on 6 min for elliptical daily. Using bandage tape for jaw stability.     Saw pulmo - Dr Wilma Flavin. Was told she doesn't have asthma, but she has been dx with this in the past. She did not have a good experience.     She was startedon 25mg  amitriptyline, thinks this is too high a dose, feels drugged, also takes hydroxyxeinand xyzal. She uses it for pain and sleep quality. She has not been able to get in touch with him she thinks it is helping sleep and pain.     Desires increase in singular. Her breathing worsens in august. Currently on 8mg , this was a compromise between 5 and 10 because it kept it her up at night, now she is taking it in the morning.     She has hx low ferritin. Has been taking iron in the morning, fasting for 30 min. Desires recheck. States was done 6/14 at Dr. Haynes Bast - hematologist's and it was 20. Wonders if this method will increase it. Desires recheck.     The following sections were reviewed this encounter by the provider:   Allergies  Meds  Problems         Review of Systems   No cough, no fever.       BP 117/75   Pulse 84   Temp 97.6 F (36.4 C) (Oral)   Resp 17   Ht 1.562 m (5' 1.5")   Wt 62.1 kg (137 lb)   SpO2 96%   BMI 25.47 kg/m      Objective:     Physical Exam   Constitutional: She is oriented to person, place, and time. She appears well-developed and well-nourished.   HENT:   Head: Normocephalic and atraumatic.   Wearing jaw strap and bandage tape   Cardiovascular: Normal rate, regular rhythm and normal heart sounds.    Pulmonary/Chest: Effort normal and breath sounds normal. No respiratory distress. She has no wheezes.   Neurological: She is alert  and oriented to person, place, and time.   Skin: Skin is warm and dry.   Psychiatric: She has a normal mood and affect. Her behavior is normal.   Nursing note and vitals reviewed.      Assessment:     1. Other chronic pain  - amitriptyline (ELAVIL) 10 MG tablet; Take 2 tablets (20 mg total) by mouth nightly  Dispense: 60 tablet; Refill: 1    2. Ehlers-Danlos disease  - amitriptyline (ELAVIL) 10 MG tablet; Take 2 tablets (20 mg total) by mouth nightly  Dispense: 60 tablet; Refill: 1    3. Insomnia, unspecified type  - amitriptyline (ELAVIL) 10 MG tablet; Take 2 tablets (20 mg total) by mouth nightly  Dispense: 60 tablet; Refill: 1    4. Mast cell activation syndrome  - montelukast (SINGULAIR) 10 MG tablet; Take 1 tablet (10 mg total) by mouth daily  Dispense: 90 tablet; Refill: 1    5. Allergic disorder, subsequent encounter  - montelukast (SINGULAIR) 10 MG tablet; Take 1 tablet (10 mg total)  by mouth daily  Dispense: 90 tablet; Refill: 1    6. Low ferritin  - Ferritin; Future        Plan:     Patient Instructions   Try using the lower dose of amitriptyline and the slightly higher dose of Singulair.  Let me know how it goes.  I will try to get your records from [name].      [name], MD

## 2018-04-30 ENCOUNTER — Encounter (INDEPENDENT_AMBULATORY_CARE_PROVIDER_SITE_OTHER): Payer: Self-pay | Admitting: Family Medicine

## 2018-05-01 ENCOUNTER — Encounter (INDEPENDENT_AMBULATORY_CARE_PROVIDER_SITE_OTHER): Payer: Self-pay | Admitting: Family Medicine

## 2018-05-03 ENCOUNTER — Other Ambulatory Visit (FREE_STANDING_LABORATORY_FACILITY): Payer: 59

## 2018-05-03 DIAGNOSIS — R790 Abnormal level of blood mineral: Secondary | ICD-10-CM

## 2018-05-03 DIAGNOSIS — R5381 Other malaise: Secondary | ICD-10-CM

## 2018-05-03 LAB — COMPREHENSIVE METABOLIC PANEL
ALT: 19 U/L (ref 0–55)
AST (SGOT): 23 U/L (ref 5–34)
Albumin/Globulin Ratio: 1.3 (ref 0.9–2.2)
Albumin: 4.4 g/dL (ref 3.5–5.0)
Alkaline Phosphatase: 104 U/L (ref 37–106)
BUN: 7 mg/dL (ref 7.0–19.0)
Bilirubin, Total: 0.5 mg/dL (ref 0.2–1.2)
CO2: 29 mEq/L (ref 21–29)
Calcium: 9.9 mg/dL (ref 8.5–10.5)
Chloride: 98 mEq/L — ABNORMAL LOW (ref 100–111)
Creatinine: 0.7 mg/dL (ref 0.4–1.5)
Globulin: 3.4 g/dL (ref 2.0–3.7)
Glucose: 89 mg/dL (ref 70–100)
Potassium: 4 mEq/L (ref 3.5–5.1)
Protein, Total: 7.8 g/dL (ref 6.0–8.3)
Sodium: 137 mEq/L (ref 136–145)

## 2018-05-03 LAB — HEPATIC FUNCTION PANEL
Bilirubin Direct: 0.2 mg/dL (ref 0.0–0.5)
Bilirubin Indirect: 0.3 mg/dL (ref 0.2–1.0)

## 2018-05-03 LAB — HEMOLYSIS INDEX
Hemolysis Index: 4 (ref 0–18)
Hemolysis Index: 5 (ref 0–18)

## 2018-05-03 LAB — FERRITIN: Ferritin: 55.72 ng/mL (ref 4.60–204.00)

## 2018-05-03 LAB — GFR: EGFR: >60

## 2018-05-05 ENCOUNTER — Encounter (INDEPENDENT_AMBULATORY_CARE_PROVIDER_SITE_OTHER): Payer: Self-pay | Admitting: Family Medicine

## 2018-05-05 ENCOUNTER — Encounter (INDEPENDENT_AMBULATORY_CARE_PROVIDER_SITE_OTHER): Payer: Self-pay

## 2018-05-06 ENCOUNTER — Encounter (INDEPENDENT_AMBULATORY_CARE_PROVIDER_SITE_OTHER): Payer: Self-pay | Admitting: Family Medicine

## 2018-05-07 ENCOUNTER — Encounter (INDEPENDENT_AMBULATORY_CARE_PROVIDER_SITE_OTHER): Payer: Self-pay | Admitting: Neurology

## 2018-05-07 ENCOUNTER — Ambulatory Visit (INDEPENDENT_AMBULATORY_CARE_PROVIDER_SITE_OTHER): Payer: 59 | Admitting: Neurology

## 2018-05-07 ENCOUNTER — Encounter (INDEPENDENT_AMBULATORY_CARE_PROVIDER_SITE_OTHER): Payer: Self-pay

## 2018-05-07 VITALS — BP 116/77 | HR 87 | Ht 61.5 in | Wt 138.2 lb

## 2018-05-07 DIAGNOSIS — G43119 Migraine with aura, intractable, without status migrainosus: Secondary | ICD-10-CM

## 2018-05-07 DIAGNOSIS — G2581 Restless legs syndrome: Secondary | ICD-10-CM

## 2018-05-07 DIAGNOSIS — G4733 Obstructive sleep apnea (adult) (pediatric): Secondary | ICD-10-CM

## 2018-05-07 NOTE — Progress Notes (Signed)
05/07/2018    Chief Complaint:   Chief Complaint   Patient presents with   . Migraine     History of Present Illness:  Teresa [name] is a 47 y.o. right-handed female who presents for follow up of multiple neurological complaints.  She was previously seen on 04/08/2018.      No new concerns.     Initial Visit History 01/15/2018:  Head pulls back and to the left.  Has associated neck pain and stiffness.    Makes it hard for her to sleep.   She had Botox injections at [facility] x 3.   First two, helped a little.   Last time, had dose increase and "this was disastrous".   Has a history of RLS.   Thinks she may also have PLMS.   Since 2008, has felt tremors in her legs.   This is an internal sensation; feels like her legs are vibrating.   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   Previously diagnosed by migraines.   Had headaches early in her life while on OCPs in her 29s.   Headaches recurred in her 40s.   She always has low level nausea, scintillating scotomas, light sensitivity.   Severity of these fluctuates.   Headaches consist of pain starting in her suboccipital region.   Radiates around her head.   Feels like a tightness, as if her brain was swollen or her scalp too tight.   These headaches were daily until she started wearing an ace bandage to help with jaw hypermobility.   Can progress to her feeling like a pressure over the vertex, like a bowling ball is sitting on top of her head.  This happens three times a week. Can last all day.   At times this progresses even further to feeling that she has railroad spike feeling over her left eye.  Can also occur on right or in the middle but less frequently.  These can be triggered by strawberries or tofu. Otherwise happens about 1 per week.  Lasts about an hour.   With these, face feels congested, left eye is twitching, has a droopy feeling.  Joint pain is severe.   No conjunctival tearing, ingestion, rhinorrhea.   Also gets "migraines", where she has these symptoms to  a more severe degree and also has sound sensitivity. With these, she may need to lay down in dark room or she may be restless, "like I'm crawling out of my skin", and needs to pace. These occur maybe once every 6 months.  Better since going off of Bupropion.   Headaches were previously helped by Tylenol but she had elevation in her liver enzymes, so she can no longer take that.   Sometimes, if she takes a Benadryl when she gets the swelling in her head feeling, it helps.   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   Using Xyzal, Atarax, Pepcid bid; started these in February or later.   Not sure if these are helping her headaches.   She is also seeing cardiology for dysautonomia.   Also noting some numbness, tingling in hands.   Worse on the right.   Lateral aspect of right elbow radiating into right 4th and 5th digits.   Makes it hard for her sleep.     Review of Systems:  Constitutional: Negative for fever. +Low energy level. +Weight change.   Respiratory: +Asthma. +Shortness of breath.  +History of chronic bronchitis.   Cardiovascular: +Chest pain. +Irregular rhythm. +High  blood pressure.  Gastrointestinal: Negative for abdominal pain. +Diarrhea. +Constipation.   Neurological: Negative for visual changes, alteration in awareness, speech or language problems, facial droop, focal weakness, limb incoordination,dysphagia, or dizziness. +Neck pain. ?Seizure. +Headaches. +Back pain. +Tremor. +Balance trouble. +Numbness/tingling. +Trouble sleeping. +RLS.   Psychiatric/Behavioral: +Depression.   Also: Cancer. Arthritis. Anxiety. Anemia. Bladder problem.  Rash. Hypermobile Ehlers-Danlos syndrome. Fibromyalgia. Migraines. Dysautonomia. 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 Bilateral tinnitus.  Pulsatile tinnitus in left ear.   All other systems reviewed and are negative except pertinent positives as noted above in HPI.     Past Medical History:  Past Medical History:    Diagnosis Date   . ADHD (attention deficit hyperactivity disorder)    . Anemia     constant   . Anxiety    . Arrhythmia     pt states she belives anemia led to heart flutters in the past.   . Asthma    . Depression    . Dysphagia    . Eczema    . Fibromyalgia    . Gastroesophageal reflux disease    . Headache     migraine, sensitivity to light   . Heart burn    . Hypercholesterolemia 12/24/2015   . Malignant neoplasm of colon 2009    chemo   . Malignant neoplasm of skin 2007    basal   . Neck pain    . Osteoporosis, idiopathic     Not sure.  Have had osteopenia in past but not now.   . Post-operative nausea and vomiting    . Psoriasis    . Seasonal allergic rhinitis    . Sleep apnea     does not use CPAP     Past Surgical History:  Past Surgical History:   Procedure Laterality Date   . APPENDECTOMY     . COLECTOMY  2009    partial   . COLONOSCOPY  2014   . COLONOSCOPY N/A 11/13/2014    Procedure: COLONOSCOPY;  Surgeon: Huntley Estelle, MD;  Location: [facility];  Service: Gastroenterology;  Laterality: N/A;   . EGD  2014   . EGD, DILATION N/A 11/13/2014    Procedure: EGD, DILATION;  Surgeon: Huntley Estelle, MD;  Location: [id number];  Service: Gastroenterology;  Laterality: N/A;  EGD, DILATION/COLONOSCOPY W/IVA   . REDUCTION MAMMAPLASTY  2006   . uterine ablation   2011     Allergies:  Codeine; Morphine; Silenor [doxepin]; Gabapentin; Advair diskus [fluticasone-salmeterol]; Bupropion; Ciprofloxacin; Egg white [albumen, egg]; Ginger; Levaquin [levofloxacin]; Soybean-containing drug products; Strawberry c [ascorbate]; Walnuts [tree nuts]; Dairy [milk-related compounds]; Eggs or egg-derived products; Ioversol; Lanolin; Lidocaine; Penicillins; and Wheat extract    Family History:  Family History   Problem Relation Age of Onset   . Colon cancer Father    . Cancer Father         colon   . Early death Father         Age 27, colon cancer   . Depression Mother    . Diabetes Mother         Pre-diabetes; not diabetes    . Heart disease Mother    . Hyperlipidemia Mother    . Vision loss Mother         cataracts and glaucoma   . Obesity Maternal Aunt    No other family history related to current complaints.    Social History:  Married.  On medical leave. No tobacco, alcohol, drug abuse.  Medications:  Current Outpatient Prescriptions   Medication Sig Dispense Refill   . albuterol (PROVENTIL HFA;VENTOLIN HFA) 108 (90 Base) MCG/ACT inhaler Inhale 2 puffs into the lungs every 4 (four) hours as needed for Wheezing. 1 Inhaler 0   . amitriptyline (ELAVIL) 10 MG tablet Take 2 tablets (20 mg total) by mouth nightly 60 tablet 1   . ascorbic acid (VITAMIN C) 500 MG tablet Take 625 mg by mouth daily.Using powder formulation     . betamethasone, augmented, (DIPROLENE) 0.05 % cream APPLY TWICE DAILY AS NEEDED TO THE AFFECTED AREAS FOR 5-7 DAYS FOR ECZEMA.  2   . Boswellia Serrata Extract Powder Take 150 mg by mouth daily.         . Calcium Citrate 200 MG Tab Take 400 mg by mouth 2 (two) times daily.Powder formulation         . cromolyn (GASTROCROM) 100 MG/5ML solution      . Cyanocobalamin (VITAMIN B-12 SL) Place under the tongue     . diphenhydrAMINE (BENADRYL) 12.5 MG chewable tablet Chew 12.5 mg by mouth 4 (four) times daily as needed.     Marland Kitchen EPINEPHrine 0.3 MG/0.3ML Solution Auto-injector injection INJECT ONE AUTO-INJECTOR INTRAMUSCULARLY AS NEEDED  4   . estradiol (ESTRACE) 0.1 MG/GM vaginal cream Place 1 g vaginally twice a week. 42.5 g 4   . ferrous sulfate 325 (65 FE) MG tablet Take 325 mg by mouth every morning with breakfast     . fexofenadine (ALLERGY 24-HR) 180 MG tablet Take 180 mg by mouth daily     . guaifenesin (ROBITUSSIN) 100 MG/5ML liquid Take 200 mg by mouth 4 (four) times daily as needed for Congestion.     . hydrOXYzine (ATARAX) 50 MG tablet Take 50 mg by mouth nightly     . L-Lysine 1000 MG Tab Take 1,300 mg by mouth daily.        Marland Kitchen L-Theanine 100 MG Cap Take 200 mg by mouth daily.         Marland Kitchen lactobacillus (CULTURELLE)  Cap Take 1 capsule by mouth     . levocetirizine (XYZAL) 5 MG tablet Take 5 mg by mouth 2 (two) times daily         . Light Mineral Oil-Mineral Oil (RETAINE MGD) 0.5-0.5 % Emulsion Apply 1 drop to eye nightly.     . Magnesium Citrate 200 MG Tab Take by mouth     . Melatonin 1 MG/ML Liquid Take 0.5 drops by mouth nightly.     . montelukast (SINGULAIR) 10 MG tablet Take 1 tablet (10 mg total) by mouth daily 90 tablet 1   . PREVIDENT 5000 BOOSTER PLUS 1.1 % Paste APPLY TO TEETH AFTER REGULAR BRUSHING AND FLOSSING  0   . pseudoephedrine (SUDAFED) 30 MG tablet Take 30 mg by mouth every 4 (four) hours as needed for Congestion.     Marland Kitchen pyridostigmine (MESTINON) 60 MG tablet Take 30 mg by mouth 5 (five) times daily         . raNITIdine (ZANTAC) 150 MG tablet Take 150 mg by mouth 2 (two) times daily     . RESTASIS 0.05 % ophthalmic emulsion INSTILL 1 DROP TWICE DAILY IN BOTH EYES  3   . triamcinolone acetonide (NASACORT ALLERGY 24HR) 55 MCG/ACT nasal inhaler 1 spray by Nasal route daily.         . vitamin D, cholecalciferol, 400 units tablet Take 400 Units by mouth daily       No current facility-administered  medications for this visit.      General Exam:  BP 116/77 (BP Site: Left arm, Patient Position: Sitting, Cuff Size: Medium)   Pulse 87   Ht 1.562 m (5' 1.5")   Wt 62.7 kg (138 lb 3.2 oz)   BMI 25.69 kg/m   Deferred.     Investigations:  Labs:  Lab Results   Component Value Date    WBC 10.46 (H) 03/04/2018    HGB 13.3 03/04/2018    HCT 42.3 03/04/2018    MCV 92.2 03/04/2018    PLT 358 (H) 03/04/2018     '  Chemistry        Component Value Date/Time    NA 137 05/03/2018 0856    K 4.0 05/03/2018 0856    CL 98 (L) 05/03/2018 0856    CO2 29 05/03/2018 0856    BUN 7.0 05/03/2018 0856    CREAT 0.7 05/03/2018 0856    GLU 89 05/03/2018 0856        Component Value Date/Time    CA 9.9 05/03/2018 0856    ALKPHOS 104 05/03/2018 0856    AST 23 05/03/2018 0856    ALT 19 05/03/2018 0856    BILITOTAL 0.5 05/03/2018 0856         Imaging:  MRI Cervical Spine Without Contrast 07/29/2017   (I personally reviewed source images and I agree with the radiologist's interpretation as noted below):  Impression:  1. No segmental instability identified.   2. Small disc herniations at the C3-4, C4-C5, and C7-T1 levels without stenosis.  3. Spondylotic change at C6-7 level with mild bilateral neuroforaminal stenosis.     MRI Brain With and Without Contrast 11/25/2016   (I personally reviewed source images and I agree with the radiologist's interpretation as noted below):  IMPRESSION:   1. Normal MR examination of the internal auditory canals and  cerebellopontine angles.  2. No acute intracranial abnormality. Minimal nonspecific white matter  changes with differential considerations as discussed.    Other Studies:  EMG/NCS 03/24/2018: Normal study.     Impression:  In summary, Teresa [name] is a 47 y.o. female with a complicated medical history who presents for follow up of multiple complaints including right>left upper extremity tingling, headaches, h/o cervical dystonia, RLS and h/o OSA.     Recommendations:  1. Intractable migraine with aura without status migrainosus  -Continue magnesium and riboflavin for migraine prevention  -Letter written for medical retirement    2. OSA (obstructive sleep apnea)  3. Restless legs  - General sleep study; Future-->additional documentation is needed on our end    Follow up with me after sleep study.     De Hollingshead, MD  [facility]  Board Certified in Adult Neurology by the American Board of Psychiatry and Neurology (ABPN)  Board Certified in Headache Medicine by the [employer] (UCNS)  05/07/2018    Over 50% of the 25 minute office visit was spent in counseling and coordination of care, particularly in the form of reviewing outside records, legal documents, and letter generation.

## 2018-05-10 ENCOUNTER — Encounter (INDEPENDENT_AMBULATORY_CARE_PROVIDER_SITE_OTHER): Payer: Self-pay | Admitting: Family Medicine

## 2018-05-10 ENCOUNTER — Other Ambulatory Visit (INDEPENDENT_AMBULATORY_CARE_PROVIDER_SITE_OTHER): Payer: Self-pay | Admitting: Neurology

## 2018-05-10 ENCOUNTER — Ambulatory Visit (INDEPENDENT_AMBULATORY_CARE_PROVIDER_SITE_OTHER): Payer: Self-pay | Admitting: Family Medicine

## 2018-05-10 ENCOUNTER — Ambulatory Visit: Payer: 59

## 2018-05-10 DIAGNOSIS — G2581 Restless legs syndrome: Secondary | ICD-10-CM

## 2018-05-10 DIAGNOSIS — G4733 Obstructive sleep apnea (adult) (pediatric): Secondary | ICD-10-CM

## 2018-05-10 NOTE — Progress Notes (Signed)
Patient's insurance will not approve in-lab sleep study.  Home study recommended.  Home study order placed.

## 2018-05-11 ENCOUNTER — Emergency Department: Payer: 59

## 2018-05-11 ENCOUNTER — Encounter (INDEPENDENT_AMBULATORY_CARE_PROVIDER_SITE_OTHER): Payer: Self-pay | Admitting: Neurology

## 2018-05-11 ENCOUNTER — Emergency Department
Admission: EM | Admit: 2018-05-11 | Discharge: 2018-05-11 | Disposition: A | Discharge status: Home / Self Care | Payer: 59 | Attending: Emergency Medicine | Admitting: Emergency Medicine

## 2018-05-11 DIAGNOSIS — E7800 Pure hypercholesterolemia, unspecified: Secondary | ICD-10-CM | POA: Insufficient documentation

## 2018-05-11 DIAGNOSIS — G2581 Restless legs syndrome: Secondary | ICD-10-CM | POA: Insufficient documentation

## 2018-05-11 DIAGNOSIS — E559 Vitamin D deficiency, unspecified: Secondary | ICD-10-CM | POA: Insufficient documentation

## 2018-05-11 DIAGNOSIS — R079 Chest pain, unspecified: Secondary | ICD-10-CM | POA: Insufficient documentation

## 2018-05-11 DIAGNOSIS — M818 Other osteoporosis without current pathological fracture: Secondary | ICD-10-CM | POA: Insufficient documentation

## 2018-05-11 DIAGNOSIS — Z79899 Other long term (current) drug therapy: Secondary | ICD-10-CM | POA: Insufficient documentation

## 2018-05-11 LAB — ECG 12-LEAD
Atrial Rate: 79 {beats}/min
Atrial Rate: 79 {beats}/min
P Axis: 53 degrees
P Axis: 53 degrees
P-R Interval: 152 ms
P-R Interval: 152 ms
Q-T Interval: 358 ms
Q-T Interval: 358 ms
QRS Duration: 72 ms
QRS Duration: 72 ms
QTC Calculation (Bezet): 410 ms
QTC Calculation (Bezet): 410 ms
R Axis: 26 degrees
R Axis: 26 degrees
T Axis: 52 degrees
T Axis: 52 degrees
Ventricular Rate: 79 {beats}/min
Ventricular Rate: 79 {beats}/min

## 2018-05-11 LAB — COMPREHENSIVE METABOLIC PANEL
ALT: 20 U/L (ref 0–55)
AST (SGOT): 27 U/L (ref 5–34)
Albumin/Globulin Ratio: 1.3 (ref 0.9–2.2)
Albumin: 4.3 g/dL (ref 3.5–5.0)
Alkaline Phosphatase: 101 U/L (ref 37–106)
BUN: 11 mg/dL (ref 7.0–19.0)
Bilirubin, Total: 0.4 mg/dL (ref 0.2–1.2)
CO2: 27 mEq/L (ref 22–29)
Calcium: 10.2 mg/dL (ref 8.5–10.5)
Chloride: 103 mEq/L (ref 100–111)
Creatinine: 0.8 mg/dL (ref 0.6–1.0)
Globulin: 3.3 g/dL (ref 2.0–3.6)
Glucose: 94 mg/dL (ref 70–100)
Potassium: 4.2 mEq/L (ref 3.5–5.1)
Protein, Total: 7.6 g/dL (ref 6.0–8.3)
Sodium: 140 mEq/L (ref 136–145)

## 2018-05-11 LAB — URINALYSIS, REFLEX TO MICROSCOPIC EXAM IF INDICATED
Bilirubin, UA: NEGATIVE
Blood, UA: NEGATIVE
Glucose, UA: NEGATIVE
Ketones UA: NEGATIVE
Leukocyte Esterase, UA: NEGATIVE
Nitrite, UA: NEGATIVE
Protein, UR: NEGATIVE
Specific Gravity UA: 1.004 (ref 1.001–1.035)
Urine pH: 8 (ref 5.0–8.0)
Urobilinogen, UA: NORMAL mg/dL

## 2018-05-11 LAB — CBC AND DIFFERENTIAL
Absolute NRBC: 0 10*3/uL (ref 0.00–0.00)
Basophils Absolute Automated: 0.07 10*3/uL (ref 0.00–0.08)
Basophils Automated: 0.7 %
Eosinophils Absolute Automated: 0.13 10*3/uL (ref 0.00–0.44)
Eosinophils Automated: 1.4 %
Hematocrit: 42.2 % (ref 34.7–43.7)
Hgb: 13.4 g/dL (ref 11.4–14.8)
Immature Granulocytes Absolute: 0.02 10*3/uL (ref 0.00–0.07)
Immature Granulocytes: 0.2 %
Lymphocytes Absolute Automated: 1.4 10*3/uL (ref 0.42–3.22)
Lymphocytes Automated: 14.9 %
MCH: 28.9 pg (ref 25.1–33.5)
MCHC: 31.8 g/dL (ref 31.5–35.8)
MCV: 90.9 fL (ref 78.0–96.0)
MPV: 9.6 fL (ref 8.9–12.5)
Monocytes Absolute Automated: 0.81 10*3/uL (ref 0.21–0.85)
Monocytes: 8.6 %
Neutrophils Absolute: 6.95 10*3/uL — ABNORMAL HIGH (ref 1.10–6.33)
Neutrophils: 74.2 %
Nucleated RBC: 0 /100 WBC (ref 0.0–0.0)
Platelets: 338 10*3/uL (ref 142–346)
RBC: 4.64 10*6/uL (ref 3.90–5.10)
RDW: 13 % (ref 11–15)
WBC: 9.38 10*3/uL (ref 3.10–9.50)

## 2018-05-11 LAB — TROPONIN I
Troponin I: <0.01 ng/mL (ref 0.00–0.09)
Troponin I: <0.01 ng/mL (ref 0.00–0.09)

## 2018-05-11 LAB — GFR: EGFR: >60

## 2018-05-11 MED ORDER — SODIUM CHLORIDE 0.9 % IV BOLUS
1000.00 mL | Freq: Once | INTRAVENOUS | Status: AC
Start: 2018-05-11 — End: 2018-05-11
  Administered 2018-05-11: 16:00:00 1000 mL via INTRAVENOUS

## 2018-05-11 NOTE — EDIE (Signed)
COLLECTIVE?NOTIFICATION?05/11/2018 10:55?Teresa, POPKO [name]?MRN: [id number]    Criteria Met      Has Advance Directive    Security and Safety  No recent Security Events currently on file    ED Care Guidelines  There are currently no ED Care Guidelines for this patient. Please check your facility'[name] medical records system.      Prescription Monitoring Program  020??- Narcotic Use Score  040??- Sedative Use Score  110??- Stimulant Use Score  110??- Overdose Risk Score  - All Scores range from 000-999 with 75% of the population scoring < 200 and on 1% scoring above 650  - The last digit of the narcotic, sedative, and stimulant score indicates the number of active prescriptions of that type  - Higher Use scores correlate with increased prescribers, pharmacies, mg equiv, and overlapping prescriptions  - Higher Overdose Risk Scores correlate with increased risk of unintentional overdose death   Concerning or unexpectedly high scores should prompt a review of the PMP record; this does not constitute checking PMP for prescribing purposes.      E.D. Visit Count (12 mo.)  Facility Visits   [facility] 2   Total 2   Note: Visits indicate total known visits.      Recent Emergency Department Visit Summary  Date Facility [facility] Type Diagnoses or Chief Complaint   May 11, 2018 [name]      Chest Pain      Jul 20, 2017 [name]      neck pain      Cervicalgia          Recent Inpatient Visit Summary  No recorded inpatient visits.     Care Team  There are no care providers on record at this time.   Collective Portal  This patient has registered at the [facility] Emergency Department   For more information visit: [email or web address]   Advance Care Plan  Available Documents:     Advance Health Care Directive     PLEASE NOTE:    1.   Any care recommendations and other clinical information are  provided as guidelines or for historical purposes only, and providers should exercise their own clinical judgment when providing care.    2.   You may only use this information for purposes of treatment, payment or health care operations activities, and subject to the limitations of applicable Collective Policies.    3.   You should consult directly with the organization that provided a care guideline or other clinical history with any questions about additional information or accuracy or completeness of information provided.    ? 2019 [place], [employer]. - [email or web address]

## 2018-05-11 NOTE — Discharge Instructions (Signed)
Dear Ms. Teresa [name]:    Thank you for choosing the [facility] Emergency Department, the premier emergency department in the [place] area.  I hope your visit today was EXCELLENT.    Specific instructions for your visit today:    Follow-up with your PCP and cardiologist in the next 2 days.  Return to ED if symptoms worsen or any concerning changes.        Chest Pain of Unclear Etiology    You have been seen for chest pain. The cause of your pain is not yet known.    Your doctor has learned about your medical history, examined you, and checked any tests that were done. Still, it is unclear why you are having pain. The doctor thinks there is only a very small chance that your pain is caused by a life-threatening condition. Later, your primary care doctor might do more tests or check you again.    Sometimes chest pain is caused by a dangerous condition, like a heart attack, aorta injury, blood clot in the lung, or collapsed lung. It is unlikely that your pain is caused by a life-threatening condition if: Your chest pain lasts only a few seconds at a time; you are not short of breath, nauseated (sick to your stomach), sweaty, or lightheaded; your pain gets worse when you twist or bend; your pain improves with exercise or hard work.    Chest pain is serious. It is VERY IMPORTANT that you follow up with your regular doctor and seek medical attention immediately here or at the nearest Emergency Department if your symptoms become worse or they change.    YOU SHOULD SEEK MEDICAL ATTENTION IMMEDIATELY, EITHER HERE OR AT THE NEAREST EMERGENCY DEPARTMENT, IF ANY OF THE FOLLOWING OCCURS:   Your pain gets worse.   Your pain makes you short of breath, nauseated, or sweaty.   Your pain gets worse when you walk, go up stairs, or exert yourself.   You feel weak, lightheaded, or faint.   It hurts to breathe.   Your leg swells.   Your symptoms get worse or you have new symptoms or concerns.                 If you do not  continue to improve or your condition worsens, please contact your doctor or return immediately to the Emergency Department.    Sincerely,  Randell Patient, Haskel Schroeder, MD  Attending Emergency Physician  [facility] Department    ONSITE PHARMACY  Our full service onsite pharmacy is located in the ER waiting room.  Open 7 days a week from 9 am to 9 pm.  We accept all major insurances and prices are competitive with major retailers.  Ask your provider to print your prescriptions down to the pharmacy to speed you on your way home.    OBTAINING A PRIMARY CARE APPOINTMENT    Primary care physicians (PCPs, also known as primary care doctors) are either internists or family medicine doctors. Both types of PCPs focus on health promotion, disease prevention, patient education and counseling, and treatment of acute and chronic medical conditions.    Call for an appointment with a primary care doctor.  Ask to see who is taking new patients.     [facility]  telephone:  [phone]  [email or web address]    DOCTOR REFERRALS  Call [phone] (available 24 hours a day, 7 days a week) if you need any further referrals and we can help you find  a primary care doctor or specialist.  Also, available online at:  [email or web address]    YOUR CONTACT INFORMATION  Before leaving please check with registration to make sure we have an up-to-date contact number.  You can call registration at [phone] to update your information.  For questions about your hospital bill, please call [phone].  For questions about your Emergency Dept Physician bill please call [phone].      [place]  If you need help with health or social services, please call 2-1-1 for a free referral to resources in your area.  2-1-1 is a free service connecting people with information on health insurance, free clinics, pregnancy, mental health, dental care, food assistance, housing, and substance abuse  counseling.  Also, available online at:  http://www.211[place].org    MEDICAL RECORDS AND TESTS  Certain laboratory test results do not come back the same day, for example urine cultures.   We will contact you if other important findings are noted.  Radiology films are often reviewed again to ensure accuracy.  If there is any discrepancy, we will notify you.      Please call [phone] to pick up a complimentary CD of any radiology studies performed.  If you or your doctor would like to request a copy of your medical records, please call [phone].      ORTHOPEDIC INJURY   Please know that significant injuries can exist even when an initial x-ray is read as normal or negative.  This can occur because some fractures (broken bones) are not initially visible on x-rays.  For this reason, close outpatient follow-up with your primary care doctor or bone specialist (orthopedist) is required.    MEDICATIONS AND FOLLOWUP  Please be aware that some prescription medications can cause drowsiness.  Use caution when driving or operating machinery.    The examination and treatment you have received in our Emergency Department is provided on an emergency basis, and is not intended to be a substitute for your primary care physician.  It is important that your doctor checks you again and that you report any new or remaining problems at that time.      [place]  The nearest 24 hour pharmacy is:    [facility] [place], [place] [address]  [place]  [facility])  Call to start or finish an application, compare plans, enroll or ask a question.  [place]: [phone]  Web:  Healthcare.gov    Help Enrolling in [place]  404-288-9719 (TOLL-FREE)  7477074443 (TTY)  Web:  Http://www.coverva.org    Local Help Enrolling in the [place]  (203) 824-7180 (MAIN)  Email:   health-help@nvfs .org  Web:  http://lewis-perez.info/  Address:  [address], [address] [place], [place] [address]    SEDATING MEDICATIONS  Sedating medications include strong pain medications (e.g. narcotics), muscle relaxers, benzodiazepines (used for anxiety and as muscle relaxers), Benadryl/diphenhydramine and other antihistamines for allergic reactions/itching, and other medications.  If you are unsure if you have received a sedating medication, please ask your physician or nurse.  If you received a sedating medication: DO NOT drive a car. DO NOT operate machinery. DO NOT perform jobs where you need to be alert.  DO NOT drink alcoholic beverages while taking this medicine.     If you get dizzy, sit or lie down  at the first signs. Be careful going up and down stairs.  Be extra careful to prevent falls.     Never give this medicine to others.     Keep this medicine out of reach of children.     Do not take or save old medicines. Throw them away when outdated.     Keep all medicines in a cool, dry place. DO NOT keep them in your bathroom medicine cabinet or in a cabinet above the stove.    MEDICATION REFILLS  Please be aware that we cannot refill any prescriptions through the ER. If you need further treatment from what is provided at your ER visit, please follow up with your primary care doctor or your pain management specialist.    [place]  Did you know [facility] has two freestanding ERs located just a few miles away?  [facility] [facility] [place] have short wait times, easy free parking directly in front of the building and top patient satisfaction scores - and the same Board Certified Emergency Medicine doctors as [facility].

## 2018-05-11 NOTE — ED Provider Notes (Signed)
The patient was seen and examined by the PA;  I have reviewed and agree with the history.  The pertinent physical exam has been documented and the plan of care was discussed with me.  I have also seen patient at bedside and agree with PA assessment and plan unless otherwise noted.    EKG -             interpreted by me: normal sinus at 72. Normal axis.         Valinda Party, MD  05/18/18 [phone]

## 2018-05-11 NOTE — ED Provider Notes (Signed)
[facility] EMERGENCY DEPARTMENT APP H&P      Visit date: 05/11/2018      CLINICAL SUMMARY          Diagnosis:    .     Final diagnoses:   Chest pain, unspecified type         MDM Notes:    Well-appearing 47 year old with extensive medical history presents with chest pain x1 hour.  Will order basic labs, chest x-ray, EKG and 2 troponins 3 hours apart.  Will attempt to contact her cardiologist due to patient's request for specific mast cell labs.   Differential diagnoses include but not limited to: ACS, pneumonia/bronchitis/URI, PE, musculoskeletal chest wall pain, GERD, anxiety, mastocytosis.               Disposition:         Discharge           I was present for the bedside discharge alongside the patient's ED nurse. Course of visit in the ED and discharge instructions were reviewed with patient and they were given the opportunity to ask any questions regarding their care today. Patient and/ or patient's family verbalized understanding of, and comfort with, instructions and plan.          Discharge Prescriptions     None                         CLINICAL INFORMATION        HPI:      Chief Complaint: Chest Pain and Shortness of Breath  .    Teresa [name] is a 47 y.o. female who presents with chest pain since 10am.  States that symptoms began after eating a broccoli chicken soup.  Had similar symptoms yesterday after eating the same soup, and wonders if she is having a mast cell reaction.  Describes CP as burning, tight, tingling.  Felt near syncope with CP this morning.  Has had similar symptoms before r/t mast cell flares.  Denies vomiting.  States that she feels a little SOB and flushed. History of colon cancer, hyperglycemia, ADHD, anemia, vitamin D deficiency, hypercholesterolemia, Ehlers-Danlos, migraines, cervical spine disease, restless leg syndrome, asthma, chronic jaw pain, facial neuralgia, hiatal hernia, IBS, tinnitus, TMJ, mast cell activation syndrome.    History  obtained from: Patient          ROS:      Positive and negative ROS elements as per HPI.  All other systems reviewed and negative.      Physical Exam:      Pulse 78  BP 146/76  Resp 16  SpO2 100 %  Temp 97.8 F (36.6 C)    Physical Exam   Constitutional: She appears well-developed and well-nourished.   HENT:   Head: Normocephalic and atraumatic.   Nose: Nose normal.   Mouth/Throat: Oropharynx is clear and moist.   Eyes: Conjunctivae are normal.   Neck: Neck supple.   Cardiovascular: Normal rate, regular rhythm and normal heart sounds.    Pulmonary/Chest: Effort normal and breath sounds normal.   Minimal L anterior chest TTP   Abdominal: Soft. Bowel sounds are normal.   Musculoskeletal: She exhibits no edema or deformity.   Neurological: She is alert.   Skin: Skin is warm and dry. No rash noted. No erythema.   Psychiatric: She has a normal mood and affect.   Nursing note and vitals reviewed.  PAST HISTORY        Primary Care Provider: [name], MD        PMH/PSH:    .     Past Medical History:   Diagnosis Date   . ADHD (attention deficit hyperactivity disorder)    . Anemia     constant   . Anxiety    . Arrhythmia     pt states she belives anemia led to heart flutters in the past.   . Asthma    . Depression    . Dysphagia    . Eczema    . Fibromyalgia    . Gastroesophageal reflux disease    . Headache     migraine, sensitivity to light   . Heart burn    . Hypercholesterolemia 12/24/2015   . Malignant neoplasm of colon 2009    chemo   . Malignant neoplasm of skin 2007    basal   . Neck pain    . Osteoporosis, idiopathic     Not sure.  Have had osteopenia in past but not now.   . Post-operative nausea and vomiting    . Psoriasis    . Seasonal allergic rhinitis    . Sleep apnea     does not use CPAP       She has a past surgical history that includes Colectomy (2009); EGD (2014); Colonoscopy (2014); Reduction mammaplasty (2006); uterine ablation  (2011); EGD, DILATION (N/A, 11/13/2014);  Colonoscopy (N/A, 11/13/2014); and Appendectomy.      Social/Family History:      She reports that she has never smoked. She has never used smokeless tobacco. She reports that she does not drink alcohol or use drugs.    Family History   Problem Relation Age of Onset   . Colon cancer Father    . Cancer Father         colon   . Early death Father         Age 53, colon cancer   . Depression Mother    . Diabetes Mother         Pre-diabetes; not diabetes   . Heart disease Mother    . Hyperlipidemia Mother    . Vision loss Mother         cataracts and glaucoma   . Obesity Maternal Aunt          Listed Medications on Arrival:    .     Home Medications     Med List Status:  In Progress Set By: Meredeth Ide, RN at 05/11/2018 11:12 AM                albuterol (PROVENTIL HFA;VENTOLIN HFA) 108 (90 Base) MCG/ACT inhaler     Inhale 2 puffs into the lungs every 4 (four) hours as needed for Wheezing.     amitriptyline (ELAVIL) 10 MG tablet     Take 2 tablets (20 mg total) by mouth nightly     ascorbic acid (VITAMIN C) 500 MG tablet     Take 625 mg by mouth daily.Using powder formulation     betamethasone, augmented, (DIPROLENE) 0.05 % cream     APPLY TWICE DAILY AS NEEDED TO THE AFFECTED AREAS FOR 5-7 DAYS FOR ECZEMA.     Boswellia Serrata Extract Powder     Take 150 mg by mouth daily.         Calcium Citrate 200 MG Tab     Take 400 mg by mouth 2 (  two) times daily.Powder formulation         cromolyn (GASTROCROM) 100 MG/5ML solution          Cyanocobalamin (VITAMIN B-12 SL)     Place under the tongue     diphenhydrAMINE (BENADRYL) 12.5 MG chewable tablet     Chew 12.5 mg by mouth 4 (four) times daily as needed.     EPINEPHrine 0.3 MG/0.3ML Solution Auto-injector injection     INJECT ONE AUTO-INJECTOR INTRAMUSCULARLY AS NEEDED     estradiol (ESTRACE) 0.1 MG/GM vaginal cream     Place 1 g vaginally twice a week.     ferrous sulfate 325 (65 FE) MG tablet     Take 325 mg by mouth every morning with breakfast     fexofenadine (ALLERGY  24-HR) 180 MG tablet     Take 180 mg by mouth daily     guaifenesin (ROBITUSSIN) 100 MG/5ML liquid     Take 200 mg by mouth 4 (four) times daily as needed for Congestion.     hydrOXYzine (ATARAX) 50 MG tablet     Take 50 mg by mouth nightly     L-Lysine 1000 MG Tab     Take 1,300 mg by mouth daily.        L-Theanine 100 MG Cap     Take 200 mg by mouth daily.         lactobacillus (CULTURELLE) Cap     Take 1 capsule by mouth     levocetirizine (XYZAL) 5 MG tablet     Take 5 mg by mouth 2 (two) times daily         Light Mineral Oil-Mineral Oil (RETAINE MGD) 0.5-0.5 % Emulsion     Apply 1 drop to eye nightly.     Magnesium Citrate 200 MG Tab     Take by mouth     Melatonin 1 MG/ML Liquid     Take 0.5 drops by mouth nightly.     montelukast (SINGULAIR) 10 MG tablet     Take 1 tablet (10 mg total) by mouth daily     PREVIDENT 5000 BOOSTER PLUS 1.1 % Paste     APPLY TO TEETH AFTER REGULAR BRUSHING AND FLOSSING     pseudoephedrine (SUDAFED) 30 MG tablet     Take 30 mg by mouth every 4 (four) hours as needed for Congestion.     pyridostigmine (MESTINON) 60 MG tablet     Take 30 mg by mouth 5 (five) times daily         raNITIdine (ZANTAC) 150 MG tablet     Take 150 mg by mouth 2 (two) times daily     RESTASIS 0.05 % ophthalmic emulsion     INSTILL 1 DROP TWICE DAILY IN BOTH EYES     triamcinolone acetonide (NASACORT ALLERGY 24HR) 55 MCG/ACT nasal inhaler     1 spray by Nasal route daily.         vitamin D, cholecalciferol, 400 units tablet     Take 400 Units by mouth daily          Allergies: She is allergic to codeine; morphine; silenor [doxepin]; gabapentin; advair diskus [fluticasone-salmeterol]; bupropion; ciprofloxacin; egg white [albumen, egg]; ginger; levaquin [levofloxacin]; soybean-containing drug products; strawberry c [ascorbate]; walnuts [tree nuts]; dairy [milk-related compounds]; eggs or egg-derived products; ioversol; lanolin; lidocaine; penicillins; and wheat extract.            VISIT INFORMATION         Clinical Course  in the ED:    Heart Score:  Used for ACS Risk Stratification    Low Score (0-3 points) = Risk of MACE of 0.9-1.7%  Moderate Score (4-6 point) = Risk of MACE of 12-16.66%  High Score (7-10 points) = Risk of MACE of 50-65%    History: Slightly suspicious (0)  ECG: Normal (0)  Age: 37-65 (+1)  Risk: 1-2 risk factors (+1)  Troponin: <= normal limit (0)    Score: 2    PERC Rule for Pulmonary Embolism  If no criteria are positive and clinician's pre-test probability is <15%, PERC Rule criteria are satisfied.  Age ? 50?: No  HR ? 100?: No  O2 Sat on Room Air < 95%?: No  Prior History of Venous Thromboembolism?: No  Trauma or Surgery within 4 weeks?: No  Hemoptysis?: No  Exogenous Estrogen?: No  Unilateral Leg Swelling?: No      1533 Have attempted to call pt's cardiologist (Dr Lequita Halt) several times without call returned.  Kirt Boys, Licensed conveyancer, has spoken with his front desk staff and left several messages.  Pt is showing no signs of anaphylaxis.  Chief complaint of CP.  Low index of suspicion for mast cell event.  Pt requested certain specific mast cell labs to be drawn but unclear how they would change management during this ED visit, and I am personally unfamiliar on how to interpret those results without consult with her physician (who will not return calls).  Pt remains clinically stable.  Will repeat trop to r/o ACS.      1615 Reviewed final troponin result with pt and husband.  Advised that she follow-up with her PCP and cardiologist.  Pt is very upset that mast cell labs were not drawn.  Advised that I attempted to discuss the requested labs with her doctor who would not return the call and that those labs would not change the course of this visit, but pt states that "you failed me." Advised that I will personally talk to her cardiologist if he ever calls back.  Will provide pt with CD of imaging and copy of all labs.          Medications Given in the ED:    .     ED Medication Orders      Start Ordered     Status Ordering Provider    05/11/18 1531 05/11/18 1530  sodium chloride 0.9 % bolus 1,000 mL  Once     Route: Intravenous  Ordered Dose: 1,000 mL     Last MAR action:  Stopped MEISTER, LISA JOY            Procedures:      Procedures      Interpretations:    SaO2: 100% on RA at 1101, normal.                 RESULTS        Lab Results:      Results     Procedure Component Value Units Date/Time    Troponin I [[id number]] Collected:  05/11/18 1444    Specimen:  Blood Updated:  05/11/18 1547     Troponin I <0.01 ng/mL     Troponin I [[id number]] Collected:  05/11/18 1144    Specimen:  Blood Updated:  05/11/18 1221     Troponin I <0.01 ng/mL     Comprehensive metabolic panel [[id number]] Collected:  05/11/18 1144    Specimen:  Blood Updated:  05/11/18 1215  Glucose 94 mg/dL      BUN 16.1 mg/dL      Creatinine 0.8 mg/dL      Sodium 096 mEq/L      Potassium 4.2 mEq/L      Chloride 103 mEq/L      CO2 27 mEq/L      Calcium 10.2 mg/dL      Protein, Total 7.6 g/dL      Albumin 4.3 g/dL      AST (SGOT) 27 U/L      ALT 20 U/L      Alkaline Phosphatase 101 U/L      Bilirubin, Total 0.4 mg/dL      Globulin 3.3 g/dL      Albumin/Globulin Ratio 1.3    GFR [[id number]] Collected:  05/11/18 1144     Updated:  05/11/18 1215     EGFR >60.0    CBC with differential [[id number]]  (Abnormal) Collected:  05/11/18 1144    Specimen:  Blood from Blood Updated:  05/11/18 1213     WBC 9.38 x10 3/uL      Hgb 13.4 g/dL      Hematocrit 21.3 %      Platelets 338 x10 3/uL      RBC 4.64 x10 6/uL      MCV 90.9 fL      MCH 28.9 pg      MCHC 31.8 g/dL      RDW 13 %      MPV 9.6 fL      Neutrophils 74.2 %      Lymphocytes Automated 14.9 %      Monocytes 8.6 %      Eosinophils Automated 1.4 %      Basophils Automated 0.7 %      Immature Granulocyte 0.2 %      Nucleated RBC 0.0 /100 WBC      Neutrophils Absolute 6.95 (H) x10 3/uL      Abs Lymph Automated 1.40 x10 3/uL      Abs Mono Automated 0.81 x10 3/uL      Abs Eos Automated 0.13 x10 3/uL       Absolute Baso Automated 0.07 x10 3/uL      Absolute Immature Granulocyte 0.02 x10 3/uL      Absolute NRBC 0.00 x10 3/uL     UA, Reflex to Microscopic (pts  3 + yrs) [[id number]] Collected:  05/11/18 1144    Specimen:  Urine Updated:  05/11/18 1200     Urine Type Clean Catch     Color, UA Colorless     Clarity, UA Clear     Specific Gravity UA 1.004     Urine pH 8.0     Leukocyte Esterase, UA Negative     Nitrite, UA Negative     Protein, UR Negative     Glucose, UA Negative     Ketones UA Negative     Urobilinogen, UA Normal mg/dL      Bilirubin, UA Negative     Blood, UA Negative              Radiology Results:      XR Chest 2 Views   Final Result    No evidence of acute cardiopulmonary disease.      Darra Lis, MD    05/11/2018 11:57 AM                  Scribe Attestation:      No scribe involved in  the care of this patient          Illa Level, FNP  05/11/18 1705       Illa Level, FNP  05/11/18 [phone]

## 2018-05-11 NOTE — ED Notes (Signed)
Bed: N 38  Expected date:   Expected time:   Means of arrival:   Comments:  [id number]

## 2018-05-12 ENCOUNTER — Encounter (INDEPENDENT_AMBULATORY_CARE_PROVIDER_SITE_OTHER): Payer: Self-pay | Admitting: Family Medicine

## 2018-05-12 ENCOUNTER — Other Ambulatory Visit (INDEPENDENT_AMBULATORY_CARE_PROVIDER_SITE_OTHER): Payer: Self-pay | Admitting: Family Medicine

## 2018-05-12 ENCOUNTER — Ambulatory Visit (INDEPENDENT_AMBULATORY_CARE_PROVIDER_SITE_OTHER): Payer: 59 | Admitting: Family Medicine

## 2018-05-12 VITALS — BP 116/79 | HR 82 | Temp 98.0°F | Resp 17 | Ht 61.5 in | Wt 137.0 lb

## 2018-05-12 DIAGNOSIS — D8940 Mast cell activation, unspecified: Secondary | ICD-10-CM

## 2018-05-12 DIAGNOSIS — G901 Familial dysautonomia [Riley-Day]: Secondary | ICD-10-CM

## 2018-05-12 MED ORDER — BLOOD PRESSURE CUFF MISC
0 refills | Status: DC
Start: 2018-05-12 — End: 2018-06-07

## 2018-05-12 NOTE — Progress Notes (Signed)
Clinical Pharmacy Medication Review  05/12/2018    Pt was referred to the clinical pharmacist for a medication review by her PCP, [name], MD.  The pt had questions about which of her medications could be causing fluctuating BP.    Pt has experienced fluctuating BP significant fluctuations in BP since mid-July.  She had been transitioning off Clonidine and has been completely off BP lowering medications since 04/05/18.  She noted that her BP is not consistently elevated throughout the day, but rather it varies widely - sometimes changing 30 mmHg in an hour.  She admitted that it could be due to her dysautonomia.     She reported starting amitriptyline in the past month as well as several additional doses of Xyzal and Zyrtec.  She also started increasing her salt intake by putting 1/8 tsp of salt in her water throughout the day - 1 tsp of salt in a day.  When asked about use of pseudophedrine she reported rarely using these agents.      ALLERGIES  Allergies   Allergen Reactions   . Codeine Other (See Comments)     SOB/Supress Breathing   . Morphine Shortness Of Breath   . Silenor [Doxepin]      agitation at night time   . Gabapentin Nausea Only   . Advair Diskus [Fluticasone-Salmeterol]      confusion   . Bupropion      migraines   . Ciprofloxacin    . Egg Latanya Presser, Egg]    . Ginger    . Levaquin [Levofloxacin]    . Soybean-Containing Drug Products    . Strawberry C [Ascorbate]    . Walnuts [Tree Nuts]    . Dairy [Milk-Related Compounds] Diarrhea     Facial burning sensation   . Eggs Or Egg-Derived Products      Exacerbates neuralgia symptoms   . Ioversol Anxiety, Hives, Itching and Palpitations   . Lanolin      Exacerbates neuralgia symptoms  Wool causes hives.   . Lidocaine Nausea Only and Tinitus   . Penicillins      Rxn unknown. Pt states allergy test done-allergic to penicillin Mold.    . Wheat Extract Diarrhea       MEDICATION LIST  Current Outpatient Prescriptions   Medication Sig   .  amitriptyline (ELAVIL) 10 MG tablet Take 2 tablets (20 mg total) by mouth nightly   . ascorbic acid (VITAMIN C) 500 MG tablet Take 625 mg by mouth daily.Using powder formulation   . betamethasone, augmented, (DIPROLENE) 0.05 % cream APPLY TWICE DAILY AS NEEDED TO THE AFFECTED AREAS FOR 5-7 DAYS FOR ECZEMA.   Courtney Paris Extract Powder Take 150 mg by mouth daily.       . Calcium Citrate 200 MG Tab Take 400 mg by mouth 2 (two) times daily.Powder formulation       . cromolyn (GASTROCROM) 100 MG/5ML solution    . Cyanocobalamin (VITAMIN B-12 SL) Place under the tongue   . diphenhydrAMINE (BENADRYL) 12.5 MG chewable tablet Chew 12.5 mg by mouth 4 (four) times daily as needed.   Marland Kitchen estradiol (ESTRACE) 0.1 MG/GM vaginal cream Place 1 g vaginally twice a week.   . ferrous sulfate 325 (65 FE) MG tablet Take 325 mg by mouth every morning with breakfast   . fexofenadine (ALLERGY 24-HR) 180 MG tablet Take 180 mg by mouth daily   . guaifenesin (ROBITUSSIN) 100 MG/5ML liquid Take 200 mg by mouth 4 (four) times  daily as needed for Congestion.   . hydrOXYzine (ATARAX) 50 MG tablet Take 50 mg by mouth nightly   . L-Lysine 1000 MG Tab Take 1,300 mg by mouth daily.      Marland Kitchen L-Theanine 100 MG Cap Take 200 mg by mouth daily.       Marland Kitchen lactobacillus (CULTURELLE) Cap Take 1 capsule by mouth   . levocetirizine (XYZAL) 5 MG tablet Take 5 mg by mouth 2 (two) times daily       . Light Mineral Oil-Mineral Oil (RETAINE MGD) 0.5-0.5 % Emulsion Apply 1 drop to eye nightly.   . Magnesium Citrate 200 MG Tab Take by mouth   . Melatonin 1 MG/ML Liquid Take 0.5 drops by mouth nightly.   . montelukast (SINGULAIR) 10 MG tablet Take 1 tablet (10 mg total) by mouth daily   . PREVIDENT 5000 BOOSTER PLUS 1.1 % Paste APPLY TO TEETH AFTER REGULAR BRUSHING AND FLOSSING   . pyridostigmine (MESTINON) 60 MG tablet Take 30 mg by mouth 5 (five) times daily       . raNITIdine (ZANTAC) 150 MG tablet Take 150 mg by mouth 2 (two) times daily   . RESTASIS 0.05 %  ophthalmic emulsion INSTILL 1 DROP TWICE DAILY IN BOTH EYES   . triamcinolone acetonide (NASACORT ALLERGY 24HR) 55 MCG/ACT nasal inhaler 1 spray by Nasal route daily.       . vitamin D, cholecalciferol, 400 units tablet Take 400 Units by mouth daily   . albuterol (PROVENTIL HFA;VENTOLIN HFA) 108 (90 Base) MCG/ACT inhaler Inhale 2 puffs into the lungs every 4 (four) hours as needed for Wheezing.   Marland Kitchen EPINEPHrine 0.3 MG/0.3ML Solution Auto-injector injection INJECT ONE AUTO-INJECTOR INTRAMUSCULARLY AS NEEDED   . pseudoephedrine (SUDAFED) 30 MG tablet Take 30 mg by mouth every 4 (four) hours as needed for Congestion.       ASSESSMENT/PLAN  1.  Volatile BP: Pt is very sensitive to medications.  It is possible that her increased salt intake and antihistamine/anticholinergic medication burden - Hydroxyzine, Zyrtec, Xyzal, Allegra, Benadryl, Mestinon, Amitriptyline - are increasing her BP.  However, it is expected that the BP elevation would be be a sustained effect.  This was discussed with the pt.  She is planning on following up with her cardiologist who treats her BP and dysautonomia.      Clarene Critchley, RPh, PharmD, BCGP  [facility] Clinical Pharmacist

## 2018-05-12 NOTE — Progress Notes (Addendum)
Subjective:      Patient ID: Teresa [name] is a 47 y.o. female     Chief Complaint   Patient presents with   . Follow-up        HPI   Pt was seen in ED yesterday. She had chest pain.  She was worried about having a mast activation episode.  She had a similar episode the day prior to presentation, and thinks both episodes were triggered by broccoli chicken soup.  The emergency department had tried to get in contact with patients mast cell doctor, but were unable to get through.  Patient was frustrated that tryptase labs were not drawn, which are times sensitive. She has a mast cell activation plan written out by her cardiologist, but the ER did not honor this, and did not draw the labs, as it was not thought that patient was not anaphylactic at the time of presentation.  The patient wanted the labs drawn as this would affect her outpatient management.    She thinks she had some episodes of anaphylaxis in the past, as she had trouble breathing and she thinks low blood pressure.  Usually these episodes responded to Benadryl.  She has never used an EpiPen, but has this. She is wondering if her fluctuating blood pressures could be due to the mass cell episode.  She is also wondering if any of her medications could be contributing to recently elevated blood pressures.    The following sections were reviewed this encounter by the provider:   Allergies  Meds  Problems         Review of Systems   She continues to feel a little bit shaky and still has chest pain.  No wheezing.  No trouble swallowing, above baseline.      BP 116/79   Pulse 82   Temp 98 F (36.7 C) (Oral)   Resp 17   Ht 1.562 m (5' 1.5")   Wt 62.1 kg (137 lb)   SpO2 99%   BMI 25.47 kg/m      Objective:     Physical Exam   Constitutional: She is oriented to person, place, and time. She appears well-developed and well-nourished.   HENT:   Jaw strap     Cardiovascular: Normal rate, regular rhythm and normal heart sounds.  Exam reveals no  friction rub.    No murmur heard.  Pulmonary/Chest: Effort normal and breath sounds normal. No respiratory distress. She has no wheezes.   Neurological: She is alert and oriented to person, place, and time.   Skin: Skin is warm and dry.   Psychiatric: She has a normal mood and affect. Her behavior is normal.   Nursing note and vitals reviewed.      Assessment:     1. Mast cell activation syndrome    2. Dysautonomia        Plan:     Patient Instructions   It sounds like you did have a mass cell activation episode.  Given that you think he may have had anaphylaxis in the past, perhaps it would be prudent to lower your threshold for taking the EpiPen. Anaphylactic episodes would require ED observation.      If such an episode presents again, it would be helpful for Dr. Cathlyn Parsons to give you an order for urine collection, as stated in the mast cell activation plan written out by her.  I would be able to order tryptase as an outpatient, as well as the other suggested  labs.    Please talk to our pharmacist today regarding possible medication side effects.      Teresa Koyanagi, MD

## 2018-05-12 NOTE — Patient Instructions (Addendum)
It sounds like you did have a mass cell activation episode.  Given that you think he may have had anaphylaxis in the past, perhaps it would be prudent to lower your threshold for taking the EpiPen. Anaphylactic episodes would require ED observation.      If such an episode presents again, it would be helpful for Dr. Cathlyn Parsons to give you an order for urine collection, as stated in the mast cell activation plan written out by her.  I would be able to order tryptase as an outpatient, as well as the other suggested labs.    Please talk to our pharmacist today regarding possible medication side effects.

## 2018-05-13 ENCOUNTER — Encounter (INDEPENDENT_AMBULATORY_CARE_PROVIDER_SITE_OTHER): Payer: Self-pay | Admitting: Neurology

## 2018-05-14 ENCOUNTER — Other Ambulatory Visit: Payer: Self-pay | Admitting: Gastroenterology

## 2018-05-14 DIAGNOSIS — R1312 Dysphagia, oropharyngeal phase: Secondary | ICD-10-CM

## 2018-05-18 ENCOUNTER — Other Ambulatory Visit: Payer: Self-pay | Admitting: Gastroenterology

## 2018-05-18 ENCOUNTER — Ambulatory Visit: Payer: 59 | Attending: Gastroenterology

## 2018-05-18 DIAGNOSIS — R150 Incomplete defecation: Secondary | ICD-10-CM | POA: Insufficient documentation

## 2018-05-18 DIAGNOSIS — R1312 Dysphagia, oropharyngeal phase: Secondary | ICD-10-CM

## 2018-05-18 DIAGNOSIS — R110 Nausea: Secondary | ICD-10-CM

## 2018-05-18 DIAGNOSIS — K5909 Other constipation: Secondary | ICD-10-CM

## 2018-05-18 DIAGNOSIS — N816 Rectocele: Secondary | ICD-10-CM | POA: Insufficient documentation

## 2018-05-18 DIAGNOSIS — N8189 Other female genital prolapse: Secondary | ICD-10-CM | POA: Insufficient documentation

## 2018-05-18 DIAGNOSIS — R1013 Epigastric pain: Secondary | ICD-10-CM

## 2018-05-19 ENCOUNTER — Ambulatory Visit: Payer: 59

## 2018-05-20 ENCOUNTER — Encounter (INDEPENDENT_AMBULATORY_CARE_PROVIDER_SITE_OTHER): Payer: Self-pay | Admitting: Family Medicine

## 2018-05-21 ENCOUNTER — Ambulatory Visit (FREE_STANDING_LABORATORY_FACILITY): Payer: 59 | Admitting: Family Medicine

## 2018-05-21 ENCOUNTER — Encounter (INDEPENDENT_AMBULATORY_CARE_PROVIDER_SITE_OTHER): Payer: Self-pay | Admitting: Family Medicine

## 2018-05-21 VITALS — BP 112/80 | HR 73 | Temp 97.7°F | Ht 61.5 in | Wt 136.0 lb

## 2018-05-21 DIAGNOSIS — G609 Hereditary and idiopathic neuropathy, unspecified: Secondary | ICD-10-CM | POA: Insufficient documentation

## 2018-05-21 DIAGNOSIS — N816 Rectocele: Secondary | ICD-10-CM

## 2018-05-21 DIAGNOSIS — L603 Nail dystrophy: Secondary | ICD-10-CM

## 2018-05-21 DIAGNOSIS — Z0000 Encounter for general adult medical examination without abnormal findings: Secondary | ICD-10-CM

## 2018-05-21 DIAGNOSIS — L853 Xerosis cutis: Secondary | ICD-10-CM

## 2018-05-21 DIAGNOSIS — M6289 Other specified disorders of muscle: Secondary | ICD-10-CM

## 2018-05-21 LAB — LIPID PANEL
Cholesterol / HDL Ratio: 2.6
Cholesterol: 208 mg/dL — ABNORMAL HIGH (ref 0–199)
Cholesterol: 208 — AB (ref 0–200)
HDL: 80 mg/dL (ref 40–9999)
HDL: 80 — AB (ref 35–70)
LDL Calculated: 118 mg/dL — ABNORMAL HIGH (ref 0–99)
LDL Cholesterol: 118
Triglycerides: 52 (ref 40–160)
Triglycerides: 52 mg/dL (ref 34–149)
VLDL Calculated: 10 mg/dL (ref 10–40)

## 2018-05-21 LAB — HEMOLYSIS INDEX: Hemolysis Index: 4 (ref 0–18)

## 2018-05-21 NOTE — Progress Notes (Addendum)
Subjective:      Patient ID: Teresa [name] is a 47 y.o. female.    Chief Complaint:  Chief Complaint   Patient presents with   . 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 Has soft foods, and avoids triggers. She is concerned about nutrition. 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 She tries to get veg in soup. No junk. The only fruit she can really tolerate is apples. She is off salt to see if that stabilized BP.  Exercise: limited by EDS. She is now doing 15 min 15 degree elevation on treadmill slowly. She thinks it is going well. Seeing PT. Doing 60 min/day.   Sleep: amitriptyline, now on 10mg . Doing well. But still struggling with sleep.   Stress: high. But thinks handling it well. She says she is "beyond stress." if her current situation deteriorates, she may move back to [place] with her mother.   Mood: sometimes irritable. "flat." exhausted.     She wants to know she can reduce her iron frequency.  It is rough on her body.  We talked about doing 2 days on, one day off iron supplementation to see if this works better with her body, and if it is still sufficient to keep her iron levels stable.    She has a history of brittle nails and dry skin.  Her dermatologist told her that zinc might be involved.  She would like this checked.    We updated her medical history to include rectocele and pelvic floor dysfunction, which were noted on pelvic floor MRI.    Problem List:  Patient Active Problem List   Diagnosis   . Colon cancer   . Hyperglycemia   . Midline low back pain without sciatica   . Attention deficit hyperactivity disorder (ADHD), combined type   . Iron deficiency anemia   . Vitamin D deficiency disease   . Vaginal dryness   . Hypercholesterolemia   . Ehlers-Danlos, hypermobile type   . Neck pain   . Paresthesia   . Hx of migraine headaches   . History of sleep apnea   . Dysphagia, unspecified type   . Cervicalgia   .  Cervical spine disease   . Cervical dystonia   . Restless leg syndrome   . Absent gag reflex   . Asthma   . Bursitis   . Chronic jaw pain   . Deviation in opening and closing of mandible   . Dizziness   . Dysautonomia   . Dyspnea   . Facial neuralgia   . Fatigue   . Hiatal hernia   . Irritable bowel syndrome (IBS)   . Nausea   . Occipital neuralgia   . Pulsatile tinnitus of left ear   . Instability of left shoulder joint   . TMJ (dislocation of temporomandibular joint)   . Anterior displacement of TMJ meniscus   . Labral tear of shoulder   . Instability of right shoulder joint   . Hyperadrenergic postural hypotension   . Chondral lesion   . Mast cell activation syndrome   . Sicca   . Transaminitis   . Arthralgia   . Idiopathic small fiber peripheral neuropathy   . Myalgia   . Patellar instability of left knee   . POTS (postural orthostatic tachycardia syndrome)   . Rectocele   . Pelvic floor  dysfunction       Current Medications:  Current Outpatient Prescriptions   Medication Sig Dispense Refill   . acetaminophen (TYLENOL) 500 MG tablet Take 500 mg by mouth daily     . albuterol (PROVENTIL HFA;VENTOLIN HFA) 108 (90 Base) MCG/ACT inhaler Inhale 2 puffs into the lungs every 4 (four) hours as needed for Wheezing. 1 Inhaler 0   . amitriptyline (ELAVIL) 10 MG tablet Take 2 tablets (20 mg total) by mouth nightly 60 tablet 1   . ascorbic acid (VITAMIN C) 500 MG tablet Take 625 mg by mouth daily.Using powder formulation     . betamethasone, augmented, (DIPROLENE) 0.05 % cream APPLY TWICE DAILY AS NEEDED TO THE AFFECTED AREAS FOR 5-7 DAYS FOR ECZEMA.  2   . Boswellia Serrata Extract Powder Take 150 mg by mouth daily.         . Calcium Citrate 200 MG Tab Take 400 mg by mouth 2 (two) times daily.Powder formulation         . cromolyn (GASTROCROM) 100 MG/5ML solution      . Cyanocobalamin (VITAMIN B-12 SL) Place under the tongue     . diphenhydrAMINE (BENADRYL) 12.5 MG chewable tablet Chew 12.5 mg by mouth 4 (four) times daily  as needed.     Marland Kitchen EPINEPHrine 0.3 MG/0.3ML Solution Auto-injector injection INJECT ONE AUTO-INJECTOR INTRAMUSCULARLY AS NEEDED  4   . estradiol (ESTRACE) 0.1 MG/GM vaginal cream Place 1 g vaginally twice a week. 42.5 g 4   . ferrous sulfate 325 (65 FE) MG tablet Take 325 mg by mouth every morning with breakfast     . guaifenesin (ROBITUSSIN) 100 MG/5ML liquid Take 200 mg by mouth 4 (four) times daily as needed for Congestion.     . hydrOXYzine (ATARAX) 50 MG tablet Take 50 mg by mouth nightly     . L-Lysine 1000 MG Tab Take 1,300 mg by mouth daily.        Marland Kitchen L-Theanine 100 MG Cap Take 200 mg by mouth daily.         Marland Kitchen lactobacillus (CULTURELLE) Cap Take 1 capsule by mouth     . levocetirizine (XYZAL) 5 MG tablet Take 5 mg by mouth 2 (two) times daily         . Light Mineral Oil-Mineral Oil (RETAINE MGD) 0.5-0.5 % Emulsion Apply 1 drop to eye nightly.     . Magnesium Citrate 200 MG Tab Take by mouth     . Melatonin 1 MG/ML Liquid Take 0.5 drops by mouth nightly.     . montelukast (SINGULAIR) 10 MG tablet Take 1 tablet (10 mg total) by mouth daily 90 tablet 1   . PREVIDENT 5000 BOOSTER PLUS 1.1 % Paste APPLY TO TEETH AFTER REGULAR BRUSHING AND FLOSSING  0   . pseudoephedrine (SUDAFED) 30 MG tablet Take 30 mg by mouth every 4 (four) hours as needed for Congestion.     Marland Kitchen pyridostigmine (MESTINON) 60 MG tablet Take 30 mg by mouth 5 (five) times daily         . raNITIdine (ZANTAC) 150 MG tablet Take 150 mg by mouth 2 (two) times daily     . RESTASIS 0.05 % ophthalmic emulsion INSTILL 1 DROP TWICE DAILY IN BOTH EYES  3   . triamcinolone acetonide (NASACORT ALLERGY 24HR) 55 MCG/ACT nasal inhaler 1 spray by Nasal route daily.         . vitamin D, cholecalciferol, 400 units tablet Take 400 Units by mouth daily     .  Blood Pressure Monitoring (BLOOD PRESSURE CUFF) Misc Take blood pressure as needed for symptoms. 1 each 0   . Blood Pressure Monitoring (BLOOD PRESSURE CUFF) Misc Take blood pressure as needed for symptoms. 1 each 0      No current facility-administered medications for this visit.        Allergies:  Allergies   Allergen Reactions   . Codeine Other (See Comments)     SOB/Supress Breathing   . Morphine Shortness Of Breath   . Silenor [Doxepin]      agitation at night time   . Gabapentin Nausea Only   . Advair Diskus [Fluticasone-Salmeterol]      confusion   . Bupropion      migraines   . Ciprofloxacin    . Egg Latanya Presser, Egg]    . Ginger    . Levaquin [Levofloxacin]    . Soybean-Containing Drug Products    . Strawberry C [Ascorbate]    . Walnuts [Tree Nuts]    . Dairy [Milk-Related Compounds] Diarrhea     Facial burning sensation   . Eggs Or Egg-Derived Products      Exacerbates neuralgia symptoms   . Ioversol Anxiety, Hives, Itching and Palpitations   . Lanolin      Exacerbates neuralgia symptoms  Wool causes hives.   . Lidocaine Nausea Only and Tinitus   . Penicillins      Rxn unknown. Pt states allergy test done-allergic to penicillin Mold.    . Wheat Extract Diarrhea       Past Medical History:  Past Medical History:   Diagnosis Date   . ADHD (attention deficit hyperactivity disorder)    . Anemia     constant   . Anxiety    . Arrhythmia     pt states she belives anemia led to heart flutters in the past.   . Asthma    . Depression    . Dysphagia    . Eczema    . Fibromyalgia    . Gastroesophageal reflux disease    . Headache     migraine, sensitivity to light   . Heart burn    . Hypercholesterolemia 12/24/2015   . Malignant neoplasm of colon 2009    chemo   . Malignant neoplasm of skin 2007    basal   . Neck pain    . Osteoporosis, idiopathic     Not sure.  Have had osteopenia in past but not now.   . Post-operative nausea and vomiting    . Psoriasis    . Seasonal allergic rhinitis    . Sleep apnea     does not use CPAP       Past Surgical History:  Past Surgical History:   Procedure Laterality Date   . APPENDECTOMY     . COLECTOMY  2009    partial   . COLONOSCOPY  2014   . COLONOSCOPY N/A 11/13/2014    Procedure:  COLONOSCOPY;  Surgeon: Huntley Estelle, MD;  Location: [facility];  Service: Gastroenterology;  Laterality: N/A;   . EGD  2014   . EGD, DILATION N/A 11/13/2014    Procedure: EGD, DILATION;  Surgeon: Huntley Estelle, MD;  Location: [facility];  Service: Gastroenterology;  Laterality: N/A;  EGD, DILATION/COLONOSCOPY W/IVA   . REDUCTION MAMMAPLASTY  2006   . uterine ablation   2011       Family History:  Family History   Problem Relation Age of Onset   . Colon cancer Father    .  Cancer Father         colon   . Early death Father         Age 42, colon cancer   . Depression Mother    . Diabetes Mother         Pre-diabetes; not diabetes   . Heart disease Mother    . Hyperlipidemia Mother    . Vision loss Mother         cataracts and glaucoma   . Obesity Maternal Aunt        Social History:  Social History     Social History   . Marital status: Married     Spouse name: N/A   . Number of children: N/A   . Years of education: N/A     Occupational History   . Not on file.     Social History Main Topics   . Smoking status: Never Smoker   . Smokeless tobacco: Never Used   . Alcohol use No   . Drug use: No   . Sexual activity: Not Currently     Partners: Male     Birth control/ protection: Abstinence, Surgical     Other Topics Concern   . Not on file     Social History Narrative   . No narrative on file       The following sections were reviewed this encounter by the provider:   Tobacco  Allergies  Meds  Med Hx  Surg Hx  Fam Hx  Soc Hx        ROS:  Review of Systems   Constitutional: Negative for chills and fever.   HENT: Negative for congestion. Dental problem: sees dentist.    Eyes: Positive for photophobia, pain, redness, itching and visual disturbance (sees eye dr, dry eye, restasis helps. hx ingrown eyelashes hx). Negative for discharge.   Respiratory: Negative for cough.    Cardiovascular: Positive for chest pain.   Gastrointestinal: Positive for abdominal pain.   Genitourinary: Positive for dysuria  (occasionally, mild).   Musculoskeletal: Positive for arthralgias and myalgias.   Skin: Positive for rash.   Allergic/Immunologic: Positive for food allergies.   Neurological: Positive for headaches.   Hematological: Bruises/bleeds easily (easy bruising, likely 2/2 EDS).   Psychiatric/Behavioral: Positive for dysphoric mood and sleep disturbance.       Vitals:  BP 112/80   Pulse 73   Temp 97.7 F (36.5 C) (Oral)   Ht 1.562 m (5' 1.5")   Wt 61.7 kg (136 lb)   BMI 25.28 kg/m      Objective:     Physical Exam:  Physical Exam   Constitutional: She is oriented to person, place, and time. She appears well-developed and well-nourished.   HENT:   Head: Normocephalic and atraumatic.   Right Ear: Hearing, tympanic membrane, external ear and ear canal normal.   Left Ear: Hearing, tympanic membrane, external ear and ear canal normal.   Mouth/Throat: Oropharynx is clear and moist.   Chinstrap.   Eyes: Pupils are equal, round, and reactive to light. EOM are normal.   Cardiovascular: Normal rate, regular rhythm and normal heart sounds.  Exam reveals no gallop and no friction rub.    No murmur heard.  Pulmonary/Chest: Effort normal and breath sounds normal. No respiratory distress. She has no wheezes. She has no rales.   Abdominal: Soft. Bowel sounds are normal. She exhibits no distension. There is tenderness (2 inches left of umbilicus).   Musculoskeletal: Normal range of motion.  Lymphadenopathy:     She has no cervical adenopathy.   Neurological: She is alert and oriented to person, place, and time.   Skin: Skin is warm and dry.   Psychiatric: She has a normal mood and affect. Her behavior is normal.   Nursing note and vitals reviewed.       Assessment:     1. Routine general medical examination at a health care facility  - Lipid panel    2. Rectocele    3. Pelvic floor dysfunction    4. Brittle nails  - Zinc    5. Dry skin  - Zinc      Plan:     Patient Instructions   We did your physical today. We talked about diet and  exercise.  Please get your blood work done fasting.           [name], MD

## 2018-05-21 NOTE — Patient Instructions (Signed)
We did your physical today. We talked about diet and exercise.  Please get your blood work done fasting.

## 2018-05-21 NOTE — Progress Notes (Signed)
Have you seen any specialists/other providers since your last visit with us?    Yes, Cardiologist     Arm preference verified?   Yes    The patient is due for influenza vaccine

## 2018-05-22 LAB — ZINC: Zinc: 68 (ref 60–130)

## 2018-05-25 ENCOUNTER — Other Ambulatory Visit (INDEPENDENT_AMBULATORY_CARE_PROVIDER_SITE_OTHER): Payer: Self-pay | Admitting: Family Medicine

## 2018-05-25 DIAGNOSIS — Q796 Ehlers-Danlos syndrome, unspecified: Secondary | ICD-10-CM

## 2018-05-25 DIAGNOSIS — G4700 Insomnia, unspecified: Secondary | ICD-10-CM

## 2018-05-25 DIAGNOSIS — G8929 Other chronic pain: Secondary | ICD-10-CM

## 2018-05-25 MED ORDER — AMITRIPTYLINE HCL 10 MG PO TABS
10.00 mg | ORAL_TABLET | Freq: Every evening | ORAL | 1 refills | Status: DC
Start: 2018-05-25 — End: 2018-11-12

## 2018-05-26 ENCOUNTER — Encounter: Payer: Self-pay | Admitting: Hematology & Oncology

## 2018-05-27 ENCOUNTER — Ambulatory Visit: Payer: 59

## 2018-05-27 ENCOUNTER — Encounter (INDEPENDENT_AMBULATORY_CARE_PROVIDER_SITE_OTHER): Payer: Self-pay | Admitting: Family Medicine

## 2018-05-27 ENCOUNTER — Encounter (INDEPENDENT_AMBULATORY_CARE_PROVIDER_SITE_OTHER): Payer: Self-pay

## 2018-05-28 ENCOUNTER — Inpatient Hospital Stay: Payer: 59 | Attending: Gastroenterology | Admitting: Rehabilitative and Restorative Service Providers"

## 2018-05-28 DIAGNOSIS — M6289 Other specified disorders of muscle: Secondary | ICD-10-CM | POA: Insufficient documentation

## 2018-05-28 NOTE — Progress Notes (Addendum)
Name:Teresa [name] Age: 47 y.o.   Date of Service: 05/28/2018  Referring Physician: Caleb Popp, MD   Date of Injury: 03/30/2018  Date Care Plan Established/Reviewed: 05/28/2018  Date Treatment Started: 05/28/2018  End of Certification Date: 07/27/2018  Sessions in Plan of Care: 10  Surgery Date: No data was found      Visit Count: 1   Diagnosis:   1. Pelvic floor dysfunction        Subjective     History of Present Illness   History of Present Illness: Pt reports has constipation and having BM throughout day between 4-7. Do pelvic MRI and CT and has incomplete voiding, rectal, rectocele, heaviness in vaginal canal, vaginal canal painful constantly. Orgasms have always been pinful. Pain at pap smear, uterus in different position.     PMH: Ehlers-Danlos syndrome diagnosed this year, dysautonomia, colon 2009 R hemicolectomy, uterine ablation for heavy bleeding, chronically anemic, (taking iron supplement)     Almost always diarrehea and loose stool.      Chemo therapy.     Migraines are constant, light sensativity.     History: ER would not let her pee and had to use catheter, bladder swollen, long period of time had fecal inconetience overnight and occasional during the day after surgery.     2006 breast reduction upper back and neck pain.     Unexpected weight gain 15-20 pounds in last two years, acitvity level decreased secondary to pain. Menopause in last 5 years.     Fatigue need to go to bed by 6:30, had to be on unpaid medical leave because of job, lights out by 4, 2 have to chill out.     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     Bladder: has leakage sneeze or uge, little spirt, pantyliner,  decreased recently, hesistancy is worse, incomplete voiding tracable to event in ER, pain with urination that is recent, cramping in urethra, pain pubic bone to vaginal canal with sitting, occasional numbness. Riding a bike is excruciating.     Pain with sex always had a little. 2012 was fine.  Since 2012 so painful has not wanted. Including getting [place]. Orgasms has always been painful and cramping. Initial penetration and deeper both painful.     Physical activity: walk, PT exercises, treadmill 15 deg incline slowly.     Stress management: listens to audio books, watchign Netflix.     Have tried drug therapy and fiber therapies, enemas don't help, only thing that helps, colonoscopy.   Cannot eat vegetables unless blended.   Functional Limitations (PLOF): Pt reports difficulty with urinary and fecal incontinence that limits ability to travel, go to work, recreational walking and physical activity (PLOF: Continence so able to travel, go to work, recreational walking and physical activity)     Social Support/Occupation      Occupation: unemployed       Precautions: No data was found  Allergies: Codeine; Morphine; Silenor [doxepin]; Gabapentin; Advair diskus [fluticasone-salmeterol]; Bupropion; Ciprofloxacin; Egg white [albumen, egg]; Ginger; Levaquin [levofloxacin]; Soybean-containing drug products; Strawberry c [ascorbate]; Walnuts [tree nuts]; Dairy [milk-related compounds]; Eggs or egg-derived products; Ioversol; Lanolin; Lidocaine; Penicillins; and Wheat extract    Past Medical History:   Diagnosis Date   . ADHD (attention deficit hyperactivity disorder)    . Anemia     constant   . Anxiety    . Arrhythmia     pt states she belives anemia led to heart flutters in the past.   .  Asthma    . Depression    . Dysphagia    . Eczema    . Fibromyalgia    . Gastroesophageal reflux disease    . Headache     migraine, sensitivity to light   . Heart burn    . Hypercholesterolemia 12/24/2015   . Malignant neoplasm of colon 2009    chemo   . Malignant neoplasm of skin 2007    basal   . Neck pain    . Osteoporosis, idiopathic     Not sure.  Have had osteopenia in past but not now.   . Post-operative nausea and vomiting    . Psoriasis    . Seasonal allergic rhinitis    . Sleep apnea     does not use CPAP                    Treatment     Therapeutic Activity   Justification: Increase awareness of PF mm  Discussion of anatomy and physiology of pelvic region and pelvic floor mm. Discussed role of PF mm including urination, sex, defecation, and support of abdominal organs.     Pt given handout in reducing mm tension, reverse kegel, bladder diary          ---      ---   Total Time   Timed Minutes  12 minutes   Untimed Minutes  35 minutes   Total Time  47 minutes        Assessment   Azlin is a 47 y.o. female presenting with pelvic floor pain, fecal and urinary incontinence  who requires Physical Therapy for the following:  Impairments: decreased PF length, increase pain reporting, postural abnormalities, core strength, functional limitations     Pain located: pelvic     Clinical presentation: stable   Barriers to therapy: Past surgical history Colon resection   Comorbidities multiple including EDS, migraines severe, nystagmus, Anemia, Depression   Occupational requirements unemployed secondary to EDS, fatigue, pain, nystagmus   Prior Level of Function: Pt reports difficulty with urinary and fecal incontinence that limits ability to travel, go to work, recreational walking and physical activity (PLOF: Continence so able to travel, go to work, recreational walking and physical activity)   Prognosis: fair  Plan   Visits per week: 1  Number of Sessions: 10  Direct One on One  16109: Therapeutic Exercise: To Develop Strength and Endurance, ROM and Flexibility  [id number]: Gait Training  60454: Neuromuscular Reeducation  [phone]: Self Care/Home Mgmt Training (ADLs, safety procedures, use of assistive devices)  97140: Manual Therapy techniques (mobilization, manipulation, manual traction) (Grade 1-4)  97530: Therapeutic Activities: Dynamic activities to improve functional performance  Dry Needling  Supervised Modalities  97010: Thermal modalities: hot/cold packs  91478: Electrical stimulation  97016: Vasopneumatic devices  Orthotics Management  [id number]:  Orthotic Mgmt and Training  [phone]: Physical performance test or measurement (eg, musculoskeletal, functional capacity).         Goals    Goal 1:  Pt will demonstrate 4/5 PF strength, 30 second endurance and good bulge ability to allow pt to maintain urinary and fecal continence.    Sessions:  10      Goal 2:  Pt will report pain in pelvic region to be 0-1/10 to allow return to activities including low back PT exercises.    Sessions:  10      Goal 3:  Increase abdominal strength to 4/5, good to allow patient to increase pelvic  and lumbar stability to decrease inconvenience.    Sessions:  10      Goal 4:  Patient will demonstrate independence in prescribed HEP with proper form, sets and reps for safe discharge to an independent program.   Sessions:  5                                Marcell Barlow, PT

## 2018-05-31 ENCOUNTER — Encounter (INDEPENDENT_AMBULATORY_CARE_PROVIDER_SITE_OTHER): Payer: Self-pay | Admitting: Family Medicine

## 2018-06-01 ENCOUNTER — Ambulatory Visit (HOSPITAL_BASED_OUTPATIENT_CLINIC_OR_DEPARTMENT_OTHER): Payer: 59

## 2018-06-01 DIAGNOSIS — R1312 Dysphagia, oropharyngeal phase: Secondary | ICD-10-CM | POA: Insufficient documentation

## 2018-06-01 DIAGNOSIS — G4733 Obstructive sleep apnea (adult) (pediatric): Secondary | ICD-10-CM

## 2018-06-01 DIAGNOSIS — G2581 Restless legs syndrome: Secondary | ICD-10-CM | POA: Insufficient documentation

## 2018-06-01 NOTE — Addendum Note (Signed)
Addended by: Marcell Barlow on: 06/01/2018 01:49 PM     Modules accepted: Orders

## 2018-06-02 ENCOUNTER — Ambulatory Visit
Admission: RE | Admit: 2018-06-02 | Discharge: 2018-06-02 | Disposition: A | Discharge status: Home / Self Care | Payer: 59 | Source: Ambulatory Visit | Attending: Gastroenterology | Admitting: Gastroenterology

## 2018-06-02 NOTE — Progress Notes (Signed)
HST of adequate duration completed. Pt educated on use of device and demonstrated understanding

## 2018-06-02 NOTE — SLP Eval Note (Signed)
[facility] Outpatient Speech Therapy  [address]  [place], [place] [address]  [phone]  [phone] (fax)    SLP CLINICAL SWALLOW EVALUATION REPORT    Patient: Teresa Tarrant JacksonMRN#: [id number]  Referring MD: Dr. Kern Alberta   Date of Referral: 06/02/2018    Patient: Teresa [name]    MRN#: [id number]       Diet Recommendations:   Diet Solid Recommendations: Continue with current diet;mechanical soft   Liquid Recommendations:  thin consistency, from a cup  Administration of Medications: PO, whole, with puree  Precautions/Compensations: Awake/alert;Upright 90 degrees for all oral intake;Alternate solids and liquids;Swallow multiple times per bite/sip;Small bites/sips;Eat/feed slowly;No straws;Effortful swallow     Follow up treatments: pharyngeal exercises;diet monitoring;strategies training  Recommendation Discussed With: : Patient              Plan/Therapy Recommendations:   Expected disposition: Recommendations: Outpatient speech                    Assessment:   Patient presents with mild oropharyngeal dysphagia c/w recent VFSS completed 03/2018.  Suspect dysphagia is exacerbated by complex comorbidities, including Ehler's Danlos Syndrome and esophageal issues (e.g. GERD).  Oral phase is c/b prolonged and discoordination mastication, impacted by reduced support of mandibular connective tissue (patient wears jaw support garment).  Suspect prolonged AP transit.  No oral residuals noted.  Pharyngeal phase c/b suspect timely initiation, mildly reduced hyolaryngeal elevation to palpation and suspect pharyngeal residue.  Patient endorses sensation of residuals with soft and regular solids.  Extra effortful swallows and liquid washes are mostly effective in relieving sensation.  Intermittent delayed throat clear/cough s/p solid trials.  No overt s/s aspiration with thin liquid or puree.  Recommend patient  continue with mech soft solids/thin liquids, meds taken 1 at a time whole in applesauce.  Recommend a few follow up sessions with SLP on an outpatient basis targeting pharyngeal strengthening and compensatory strategies.     History of Present Illness:   Teresa [name] is a 47 y.o. female referred for a clinical swallow evaluation on 06/02/2018 with the chief complaint of s/s aspiration with eating and drinking.  Endorses the feeling of fibrous/hard/dry foods getting stuck and occasionally regurgitating them later (e.g. Apple peel, peanut butter).  Feels that this improves with an effortful swallow and increasing consciousness while eating.  Patient takes pills with water, also occasionally regurgitates pill either immediately or up to 90 minutes later.  Denies PNA, need for Heimlich and unplanned weight loss.    Per VFSS 04/04/18: "mild oropharyngeal dysphagia. Pt with hx of dysphagia for ~10 years and patient denies signs of progression or recent respiratory illness but never received intervention for mild dysphagia. 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 Pt demo slow yet effective mastication with soft solids. Pt with timely A/P transfer with mild lingual residue with puree and solids which she clears independently with a spontaneous swallow. Pharyngeal response appears timely with mildly reduced hyolaryngeal elevation/excursion. Pt with resulting trace pyriform/vallecular retention and coating of the aryepiglottic folds with liquids and mild retention with puree and solids. Pt is able to partially clear with a second spontaneous swallow and/or liquid wash. All PO passes through PES without difficulty. No penetration or aspiration identified. Pt was indicating some globus sensation and pointing to base of tongue.  The  Radiologist kindly  performed an esophageal sweep in A/P view and followed barium contrast into the gastric body. At this time pt appears to be managing soft diet with thin liquids and advised to continue current diet. Pt should continue with independent compensatory strategies using additional cleansing swallow or liquid wash as needed. Would recommend considering outpatient SLP follow-up on a trial basis to initiate a home exercise program to address pharyngeal strengthening"    Medical Diagnosis: Dysphagia, oropharyngeal phase [R13.12]    Therapy Diagnosis: Oropharyngeal dysphagia    Past Medical/Surgical History:  Past Medical History:   Diagnosis Date   . ADHD (attention deficit hyperactivity disorder)    . Anemia     constant   . Anxiety    . Arrhythmia     pt states she belives anemia led to heart flutters in the past.   . Asthma    . Depression    . Dysphagia    . Eczema    . Fibromyalgia    . Gastroesophageal reflux disease    . Headache     migraine, sensitivity to light   . Heart burn    . Hypercholesterolemia 12/24/2015   . Malignant neoplasm of colon 2009    chemo   . Malignant neoplasm of skin 2007    basal   . Neck pain    . Osteoporosis, idiopathic     Not sure.  Have had osteopenia in past but not now.   . Post-operative nausea and vomiting    . Psoriasis    . Seasonal allergic rhinitis    . Sleep apnea     does not use CPAP     Past Surgical History:   Procedure Laterality Date   . APPENDECTOMY     . COLECTOMY  2009    partial   . COLONOSCOPY  2014   . COLONOSCOPY N/A 11/13/2014    Procedure: COLONOSCOPY;  Surgeon: Huntley Estelle, MD;  Location: [facility];  Service: Gastroenterology;  Laterality: N/A;   . EGD  2014   . EGD, DILATION N/A 11/13/2014    Procedure: EGD, DILATION;  Surgeon: Huntley Estelle, MD;  Location: [facility];  Service: Gastroenterology;  Laterality: N/A;  EGD, DILATION/COLONOSCOPY W/IVA   . REDUCTION MAMMAPLASTY  2006   . uterine ablation   2011         History/Current Status:  Respiratory  Status: within normal limits;room air  Behavior/Mental Status: Awake/alert;Able to follow directions;Cooperative;Pleasant mood  Nutrition: oral  Diet Prior to Study: mechanical soft;thin liquids    Subjective:   Patient arrived to session on time independently.  She was pleasant and engaged.  Bandage and tape in place for mandibular support.  Patient with aspen collar, removed for session.      PAIN:  Denies    Objective:   Swallowing:    Oral Motor Skills:  Engineer, maintenance (IT) Skills: exceptions to [facility] (Mandibular support for weakness (jaw bra))  Oral Motor Impairments: other (comment) (mandibular weakness)    Deglutition Skills:  Deglutition Skills  Position: upright 90 degrees  Food(s) Tested: ice chips;thin liquid;puree;soft solid;solid  Oral Stage: bolus formation/control reduced, slow but effective;chewing reduced, slow but effective  Pharyngeal Stage: adequate;suspect reduced laryngeal elevation through palpation;multiple swallows per bolus;delayed weak cough;delay throat clearing  Esophageal Stage: c/o globus sensation;regurgitation;history of esophageal diagnosis    Exercises:  SLP introduced and modeled exercises.  Patient completed as follows with mod cues:  Lingual Resistance Stretches (Up and anterior only):  10/10  Effortful: 10/10 - cues given not to clench teeth  Mendelsohn: 5/5  Masako: 5/5 - difficulty with initiation, Improved with breaks between reps      Education:  Verbal and written.  Patient given written instructions for pharyngeal strengthening exercises and swallow strategies.  Verbalized understanding and agreement with POC.    Long Term Goal(s) to be completed by 06/26/2018 or by discharge from SLP services:    1. Pt will demonstrate use of recommended strategies with safest and least restrictive diet consistency with functional independence and no clinical concern for dysphagia-related complications.  2. Pt will participate in further education, HEP, evaluation, and treatment  as clinically indicated throughout course of treatment.    Short Term Goal(s) to be completed by 06/25/2018 or within 10 visits:    1. Pt will demonstrate use of effortful swallow across 10 trials of mechanical soft solids without significant clinical concern for aspiration or dysphagia-related complications.  2. Pt will verbalize understanding of results and recommendations from VFSS.   3. Pt will verbalize understanding of s/s of aspiration and 3 pillars of aspiration pneumonia and aspiration pneumonia prevention.   4. Pt will complete pharyngeal strengthening exercise program with 100% acc independently.     Thank you for including me in the care of Ms. Sammarco.    Arva Chafe, MS CCC-SLP  [place] Lic# [id number]       Time of Treatment:  SLP Received On: 06/02/18  Start Time: 0802  Stop Time: 0855  Time Calculation (min): 53 min      [facility]  [address]  [place], [place] [address]  [phone] Tel  [phone] Fax    Physician Agreement of Patient Plan of Care    Date: 06/02/2018    Dear Dr. Kern Alberta    Thank you for allowing [place] to participate in the care of [facility]. Enclosed is the Physical/Occupational/Speech Therapy Evaluation and Plan of Care for this patient.     Regulations from the Center for Medicare and Medicaid Services (CMS) require your review and approval of this plan of care. Please note that since a prescription or order for therapy does not legally constitute approval of plan of care, we will not be able to proceed with therapy until we receive your approval of the plan of care.     Please sign and date the Physician Certification Statement below and fax this document back to [place] within 3 business days of receipt so we can initiate the therapy treatment plan.     Sincerely,     Duke Salvia  Director of Rehabilitation Services    [facility]  [facility]  Rehabilitation Department    Physician Certification  I have read  and agreed with the plan of care for the above named patient who is under my care.     Diagnosis: Oropharyngeal dysphagia    Therapy Diagnosis: oropharyngeal dysphagia    Frequency:  1 Times per week for    Up to 6 visits    Certification Period: 06/02/18 - 08/1818    Physician Signature: ________________________________ Date: __________    Physician NPI: _____________________________    Please fax completed to [facility], Department of Rehabilitation at [phone]

## 2018-06-03 ENCOUNTER — Ambulatory Visit: Payer: 59

## 2018-06-04 ENCOUNTER — Telehealth (INDEPENDENT_AMBULATORY_CARE_PROVIDER_SITE_OTHER): Payer: Self-pay | Admitting: Family Medicine

## 2018-06-04 ENCOUNTER — Encounter (INDEPENDENT_AMBULATORY_CARE_PROVIDER_SITE_OTHER): Payer: Self-pay | Admitting: Family Medicine

## 2018-06-04 NOTE — Telephone Encounter (Signed)
Dr. Alphonsa Gin ,    Spoken with [facility] regarding patient Blood pressure monitor and cuff, [employer] states that didn't received order. Can you re-order them again with new fax [phone].      Thank you ,    Teresa [name]

## 2018-06-05 NOTE — Procedures (Signed)
Service Date: 06/01/2018     Patient Type: O     PHYSICIAN/PROVIDER: Campbell Lerner MD     REFERRING PHYSICIAN: Edmon Crape MD     TITLE OF PROCEDURE:  Home sleep study.     REFERRING PHYSICIAN:    De Hollingshead, MD     INDICATION:  Sleep apnea, restless leg syndrome.     SUMMARY OF FINDINGS:  The total study time was 9 hours and 14 minutes with a sleep time of 7  hours and 40 minutes.  The percent of sleep spent in REM sleep was 24.2%.   The total apnea-hypopnea index was 19.5, with an oxygen saturation index of  17.0 and a respiratory disturbance index of 23.0.  The minimum oxygen  saturation was 81% with a mean oxygen saturation of 94%.  The mean pulse  rate was 67.  The patient slept exclusively supine.  The percent of sleep  spent snoring at decibel level greater than 40 dB was 7.8%.     IMPRESSION:  The findings are consistent with moderate sleep apnea, which was all seen  in the supine position.  Given the symptomatology, CPAP is recommended for  therapy and an in-lab CPAP titration study would assure resolution of the  sleep apnea.  Other conservative measures include positional therapy such  as avoiding sleeping supine, weight loss if appropriate, or oral appliance  therapy.  If not done so previously, consider checking the patient's  thyroid function.     Thank you for referring this patient for evaluation.           D:  06/05/2018 17:22 PM by Dr. Minerva Areola B. Stacy Gardner, MD ([id number])  T:  06/05/2018 21:43 PM by NTS      Everlean Cherry: [id number]) (Doc ID: [id number])

## 2018-06-07 ENCOUNTER — Encounter (INDEPENDENT_AMBULATORY_CARE_PROVIDER_SITE_OTHER): Payer: Self-pay

## 2018-06-07 ENCOUNTER — Encounter (INDEPENDENT_AMBULATORY_CARE_PROVIDER_SITE_OTHER): Payer: Self-pay | Admitting: Family Medicine

## 2018-06-07 ENCOUNTER — Other Ambulatory Visit (INDEPENDENT_AMBULATORY_CARE_PROVIDER_SITE_OTHER): Payer: Self-pay | Admitting: Family Medicine

## 2018-06-07 ENCOUNTER — Telehealth (INDEPENDENT_AMBULATORY_CARE_PROVIDER_SITE_OTHER): Payer: Self-pay

## 2018-06-07 ENCOUNTER — Other Ambulatory Visit (INDEPENDENT_AMBULATORY_CARE_PROVIDER_SITE_OTHER): Payer: Self-pay | Admitting: Neurology

## 2018-06-07 ENCOUNTER — Inpatient Hospital Stay: Payer: 59 | Attending: Gastroenterology | Admitting: Rehabilitative and Restorative Service Providers"

## 2018-06-07 ENCOUNTER — Telehealth (INDEPENDENT_AMBULATORY_CARE_PROVIDER_SITE_OTHER): Payer: Self-pay | Admitting: Family Medicine

## 2018-06-07 DIAGNOSIS — G4733 Obstructive sleep apnea (adult) (pediatric): Secondary | ICD-10-CM

## 2018-06-07 DIAGNOSIS — M6289 Other specified disorders of muscle: Secondary | ICD-10-CM | POA: Insufficient documentation

## 2018-06-07 DIAGNOSIS — D8940 Mast cell activation, unspecified: Secondary | ICD-10-CM

## 2018-06-07 DIAGNOSIS — G901 Familial dysautonomia [Riley-Day]: Secondary | ICD-10-CM

## 2018-06-07 MED ORDER — BLOOD PRESSURE CUFF MISC
0 refills | Status: AC
Start: 2018-06-07 — End: ?

## 2018-06-07 NOTE — Telephone Encounter (Signed)
Faxed blood pressure monitor and b/p cuff to [facility] [phone]. confirmed fax

## 2018-06-07 NOTE — PT/OT Therapy Note (Signed)
Name: Teresa [name] Age: 47 y.o.   Date of Service: 06/07/2018  Referring Physician: Caleb Popp, MD   Date of Injury: 03/30/2018  Date Care Plan Established/Reviewed: 05/28/2018  Date Treatment Started: 05/28/2018  End of Certification Date: 07/27/2018  Sessions in Plan of Care: 10  Surgery Date: No data was found    Visit Count: 2   Diagnosis:   1. Pelvic floor dysfunction        Subjective     Social Support/Occupation      Occupation: unemployed     Pt reports feels constantly constipated, so uncomfortable. Pt reports feels constantly like she has to have a bowel movement but unable to, yesterday had chills, headaches, nauseau, dry mouth and excessive thirst. Went to the bathroom 4-5 times last night. Pt did cleanse yesterday of miralax to get colonoscopy like feeling of completley empty bowel.       Precautions: No data was found  Allergies: Codeine; Morphine; Silenor [doxepin]; Gabapentin; Advair diskus [fluticasone-salmeterol]; Bupropion; Ciprofloxacin; Egg white [albumen, egg]; Ginger; Levaquin [levofloxacin]; Soybean-containing drug products; Strawberry c [ascorbate]; Walnuts [tree nuts]; Dairy [milk-related compounds]; Eggs or egg-derived products; Ioversol; Lanolin; Lidocaine; Penicillins; and Wheat extract    Objective     Perineal Observation   General Skin condition: no abnormal redness, discharge   Perineal Body Resting Position: descended   Voluntary contraction: present  Involuntary contraction (with cough, sneeze, laugh): present  Involuntary relaxation: present  Perineal descent (act as if releasing gas): present  Perineal descent bearing down: present    Vulvar Palpation:   Superficial layer: mild tissue tension  Deep layer: mild to moderate tissue tension, no tenderness     Contraction Ability   Voluntary contraction: moderate  MMT Brink Terminology      Right: 3+  Muscle Endurance      Right (seconds): 7  Number of quick contractions in 10 seconds: 9    Tissue Laxity Tests   Anterior  wall of vagina: laxity grade 1-2   Posterior wall of vagina: laxity grade 1-2     Pt demonstrates tissue tension in B coccygeus, puborectalis in internal rectal.                  Treatment     Therapeutic Exercises   Justification: To improve:Flexibility/ROM, Stabilization and Strength   PF mm assessment external, internal vaginal, internal rectal     Issued HEP and handout for bowel posture     Neuromuscular Re-Education   Justification: For activation and/or inhibition of target muscle, to improve balance and proprioception  Supine bulge x20     pball proprioceptive rolling x2'     pball sitting bulge x20     Manual Therapy   Justification: To improve joint mobility, soft tissue mobility, and reduce trigger points  STM/DTM to B coccygeus, puborectalis, obturator internus    Therapeutic Activity   Justification: For improvement in the completion of ADL's and functional activities  Discussion and demonstration of proper defecation mechanics including knees higher than hips feet propped on stool, lean forward, extended L/S, and exhale with bulge/push with having bowel movement to decrease IAP with defecation.          ---      ---   Total Time   Timed Minutes  42 minutes   Total Time  42 minutes        Assessment   Dorthea consented to PF external and internal mm assessment this session. Pt demonstrated increased tissue  tension along coccygeus and puborectalis and was able to tolerate STM. Pt demonstrates fair+ to good bulge ability after significant cuing.     Pt would continue to benefit from skilled physical therapy to address tissue tension, posture mechanics, hip and core weakness to allow return to PLOF.   Plan   Continue to progress per POC, update goal nv, assess response to first session and HEP.       Goals    Goal 1:  Pt will demonstrate 4/5 PF strength, 30 second endurance and good bulge ability to allow pt to maintain urinary and fecal continence.    Sessions:  10      Goal 2:  Pt will report pain in pelvic  region to be 0-1/10 to allow return to activities including low back PT exercises.    Sessions:  10      Goal 3:  Increase abdominal strength to 4/5, good to allow patient to increase pelvic and lumbar stability to decrease inconvenience.    Sessions:  10      Goal 4:  Patient will demonstrate independence in prescribed HEP with proper form, sets and reps for safe discharge to an independent program.   Sessions:  5                                Marcell Barlow, PT

## 2018-06-07 NOTE — Telephone Encounter (Addendum)
Called patient and advised of same. Patient verbalized understanding.    ----- Message from Edmon Crape, MD sent at 06/07/2018  3:55 PM EDT -----  Done, thanks!  ----- Message -----  From: Renato Battles, LPN  Sent: 1/61/0960   3:49 PM EDT  To: Edmon Crape, MD    Hey Dr Hyacinth Meeker,    Please order the CPAP titration, so that when I call the patient she will be cleared to schedule as well.    Thanks,  SAB  ----- Message -----  From: Edmon Crape, MD  Sent: 06/07/2018   9:01 AM EDT  To: Renato Battles, LPN    Please advise patient of results of sleep study:  The findings are consistent with moderate sleep apnea, which was all seen  in the supine position.  Given the symptomatology, CPAP is recommended for  therapy and an in-lab CPAP titration study would assure resolution of the  sleep apnea.  Other conservative measures include positional therapy such  as avoiding sleeping supine, weight loss if appropriate, or oral appliance  therapy.

## 2018-06-07 NOTE — Telephone Encounter (Signed)
Tammy, was this completed?

## 2018-06-07 NOTE — Progress Notes (Signed)
Name:Teresa [name] Age: 47 y.o.   Date of Service: 06/07/2018  Referring Physician: Caleb Popp, MD   Date of Injury: No data was found  Date Care Plan Established/Reviewed: No data was found  Date Treatment Started: No data was found  End of Certification Date: No data was found  Sessions in Plan of Care: No data was found  Surgery Date: No data was found      Visit Count: Visit count could not be calculated. Make sure you are using a visit which is associated with an episode.   Diagnosis: No diagnosis found.         Precautions: No data was found  Allergies: Codeine; Morphine; Silenor [doxepin]; Gabapentin; Advair diskus [fluticasone-salmeterol]; Bupropion; Ciprofloxacin; Egg white [albumen, egg]; Ginger; Levaquin [levofloxacin]; Soybean-containing drug products; Strawberry c [ascorbate]; Walnuts [tree nuts]; Dairy [milk-related compounds]; Eggs or egg-derived products; Ioversol; Lanolin; Lidocaine; Penicillins; and Wheat extract    Past Medical History:   Diagnosis Date   . ADHD (attention deficit hyperactivity disorder)    . Anemia     constant   . Anxiety    . Arrhythmia     pt states she belives anemia led to heart flutters in the past.   . Asthma    . Depression    . Dysphagia    . Eczema    . Fibromyalgia    . Gastroesophageal reflux disease    . Headache     migraine, sensitivity to light   . Heart burn    . Hypercholesterolemia 12/24/2015   . Malignant neoplasm of colon 2009    chemo   . Malignant neoplasm of skin 2007    basal   . Neck pain    . Osteoporosis, idiopathic     Not sure.  Have had osteopenia in past but not now.   . Post-operative nausea and vomiting    . Psoriasis    . Seasonal allergic rhinitis    . Sleep apnea     does not use CPAP                                  Marcell Barlow, PT

## 2018-06-11 ENCOUNTER — Encounter (INDEPENDENT_AMBULATORY_CARE_PROVIDER_SITE_OTHER): Payer: Self-pay | Admitting: Family Medicine

## 2018-06-11 ENCOUNTER — Ambulatory Visit (INDEPENDENT_AMBULATORY_CARE_PROVIDER_SITE_OTHER): Payer: 59 | Admitting: Family Medicine

## 2018-06-11 VITALS — BP 105/74 | HR 82 | Temp 98.5°F | Wt 137.6 lb

## 2018-06-11 DIAGNOSIS — Q796 Ehlers-Danlos syndrome, unspecified: Secondary | ICD-10-CM

## 2018-06-11 NOTE — Progress Notes (Signed)
Have you seen any specialists/other providers since your last visit with us?    No    Arm preference verified?   Yes    The patient is due for nothing at this time, HM is up-to-date.

## 2018-06-11 NOTE — Patient Instructions (Signed)
We talked about your letter.  We will recheck your ferritin in a few weeks.  Let us have one more visit before you leave to wrap up any loose ends.

## 2018-06-11 NOTE — Progress Notes (Addendum)
Subjective:      Patient ID: Teresa [name] is a 47 y.o. female     No chief complaint on file.   CC: f/u disability paperwork    HPI   Patient is here to discuss disability paperwork.  She has been in contact with her other specialists, some of whom will be writing letters for her.  She does need an updated letter for her lawyer.  She has given this Clinical research associate template regarding what should be added to this letter, including MCAS symptoms. She also needs a letter regarding the need for leave without pay.    She will be moving in November.  She is looking into getting a primary care doctor and specialists in [place]. We talked about having her records transferred and getting her new specialists summaries from her old specialists. She will have the same insurance.  Transportation will be the biggest issue for her. She will be seeing Dr. Mikki Harbor (EDS) October 31.     She changed her ferritin dosing, and would like this to be rechecked before she leaves.    15 min spent with pt in discussion regarding her care coordination.     The following sections were reviewed this encounter by the provider:   Allergies  Meds  Problems         Review of Systems       BP 105/74 (BP Site: Left arm, Patient Position: Sitting, Cuff Size: Small)   Pulse 82   Temp 98.5 F (36.9 C) (Oral)   Wt 62.4 kg (137 lb 9.6 oz)   BMI 25.58 kg/m      Objective:     Physical Exam  Vitals signs and nursing note reviewed.   HENT:      Head: Normocephalic and atraumatic.      Comments: Jaw strap  Neurological:      Mental Status: She is alert.   Psychiatric:         Mood and Affect: Mood normal.         Behavior: Behavior normal.         Assessment:     1. EDS (Ehlers-Danlos syndrome)        Plan:     Patient Instructions   We talked about your letter.  We will recheck your ferritin in a few weeks.  Let us have one more visit before you leave to wrap up any loose ends.      [name], MD

## 2018-06-14 ENCOUNTER — Ambulatory Visit (INDEPENDENT_AMBULATORY_CARE_PROVIDER_SITE_OTHER): Payer: 59 | Admitting: Neurology

## 2018-06-15 ENCOUNTER — Encounter (INDEPENDENT_AMBULATORY_CARE_PROVIDER_SITE_OTHER): Payer: Self-pay | Admitting: Family Medicine

## 2018-06-15 ENCOUNTER — Ambulatory Visit (INDEPENDENT_AMBULATORY_CARE_PROVIDER_SITE_OTHER): Payer: 59 | Admitting: Family Medicine

## 2018-06-15 ENCOUNTER — Inpatient Hospital Stay: Payer: 59 | Attending: Gastroenterology | Admitting: Rehabilitative and Restorative Service Providers"

## 2018-06-15 VITALS — BP 113/78 | HR 76 | Temp 98.6°F | Wt 140.0 lb

## 2018-06-15 DIAGNOSIS — Z0271 Encounter for disability determination: Secondary | ICD-10-CM

## 2018-06-15 DIAGNOSIS — M6289 Other specified disorders of muscle: Secondary | ICD-10-CM

## 2018-06-15 NOTE — Progress Notes (Signed)
Subjective:      Patient ID: Teresa [name] is a 47 y.o. female     Chief Complaint   Patient presents with   . Ehlers Danlos Syndrome     Disablilty Letter        HPI   Pt presented for disability letter. This was reviewed and edited over several drafts, and compared against requirements set forth by her lawyer. The final product is in chart, and also in this note.     To whom it may concern:    Teresa [name] has been a patient at our primary care office for 2.5 years. I have been her primary care provider for about 6 months.  [name] has multiple medical conditions and sees several specialists.  I have outlined her major conditions here.  See attached for her full problem list. 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 She has also mast cell activation syndrome, symptoms of which include headaches, fatigue, dermatitis, gastrointestinal distress, pain secondary to inflammation, and blood pressure instability.  More serious symptoms include difficulty breathing, and throat tightness.  She carries an EpiPen due to the possibility of anaphylaxis. She sees cardiology (Dr. Cathlyn Parsons) for these complaints.    She has a history of colon cancer and follows with oncology.    She has a rectocele compounding pelvic floor dysfunction, and she is seeing a pelvic floor physical therapy.     She has multiple joint and muscle pains and joint instability secondary to Ehlers-Danlos Syndrome (EDS).  She has (neck) cervical dystonia, temporomandibular joint dysfunction, chronic jaw pain, occipital neuralgia, instability of right and left shoulder joint, shoulder labral tear and  patellar and left knee instability, low back pain. She already sees an orthopedist, Dr. Leland Her, and a dentist (Dr. Aundria Rud) for these issues.  She will be following up with an EDS specialist in October 2019. She also sees another specialist, [name], for MCAS, EDS, and autonomic dysfunction.    She has migraines, sleep apnea and restless leg syndrome.  She follows with a neurologist (Dr. Hyacinth Meeker).  It has improved with improvement in her iron stores.    In total, Teresa [name] sees an orthopedist, physical therapist, cardiologist, pain specialist, neurologist, gastroenterologist, allergist, EDS specialist, dermatologist, optometrist, and ophthalmologist, and speech therapist.     Several of her conditions can cause flareups of pain and other symptoms. These flareups can last between several hours to several days at a time, and can leave her fatigued for up to several days afterwards.     Some of the essential Research officer, political party job duties Teresa [name] unable to perform include: Work-related travel as described by her management; lifting/carrying more than 8 pounds, which she can only do for a short period of time, rummaging through trash, exposure to hot temperatures, bright lights and dust, late night preparation, working long days, sitting/kneeling on the floor, typing, using a mouse, sitting or standing for long periods of time.    Due to hypermobile Ehlers-Danlos Syndrome (hEDS), [name] has severe shoulder instability, extremely severe temporomandibular joint dysfunction (TMD), generalized joint pain, gastrointestinal problems, difficulty sleeping, and fatigue. Migraines, dysautonomia, dry eye, and Mast Cell Activation Syndrome (MCAS) further contribute  to her pain, gastrointestinal problems, sleep difficulty and fatigue.     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 Her EDS has been symptomatic for several years, and she was formally diagnosed in the last  year. Her orthopedist reports that the prognosis for her shoulders and jaw are poor, and he does not expect functioning of those joints to improve substantially. Due to the pain and laxity in her shoulders, Teresa [name] has difficulty with activities of daily living and work, including typing, using a mouse, lifting, carrying, and transporting luggage. Due to both the pain in her suboccipital region caused by her TMD and the laxity of her shoulders, she is no longer able to drive, and being a passenger in a car severely exacerbates her suboccipital pain.     She wears a hard cervical collar when she showers, walks, and rides as a passenger in any vehicle, and she wears an elastic band around her head to support her jaw at all times. 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     No long-term prognostic data is available for MCAS, however, there are anecdotal reports that some individuals may experience remission after some years of treatment. Those with MCAS are at increased risk for anaphylaxis.      As part of her treatment, she should endeavor to avoid mast cell triggers, which can include many foods, emotional or physical stress, pain stimuli, and fatigue. Additional MCAS triggers specific to her current position are vibration (such as riding in a vehicle during commute or travel) and environmental stimuli such as hot air, odors, and common allergens such as dust. Again, MCAS flares cause pain and fatigue.     Teresa [name] dysautonomia and hEDS-related conditions are being treated by medication, nutritional supplements, compression clothing, braces &splints, physical therapy, specialized exercise, and rest. Her migraines are being treated with medication, nutritional supplements and avoidance of triggers (i.e. bright light). Dry eye is being treated with medication, rewetting drops, and use of a humidifier. For her MCAS, Teresa [name] not only avoids triggers but also takes a regimen of  medications, one which requires strict scheduling that affects the times when she may eat. Others can make her sleepy during the day. This medicine regimen may interfere with job duties on days when her schedule is inflexible. As of yet, these treatments have been insufficient to address all of her symptoms.     If you have any questions or concerns, please don't hesitate to call.    Sincerely,      [name], MD     50 min spent writing this letter.     The following sections were reviewed this encounter by the provider:        Review of Systems       BP 113/78 (BP Site: Left arm, Patient Position: Sitting, Cuff Size: Small)   Pulse 76   Temp 98.6 F (37 C) (Oral)   Wt 63.5 kg (140 lb)   BMI 26.02 kg/m      Objective:     Physical Exam  Vitals signs reviewed.         Assessment:     1. Encounter for disability assessment        Plan:     There are no Patient Instructions on file for this visit.    [name], MD

## 2018-06-15 NOTE — Progress Notes (Signed)
Name:Rumaysa Stapleford Henery Age: 47 y.o.   Date of Service: 06/15/2018  Referring Physician: Caleb Popp, MD   Date of Injury: No data was found  Date Care Plan Established/Reviewed: No data was found  Date Treatment Started: No data was found  End of Certification Date: No data was found  Sessions in Plan of Care: No data was found  Surgery Date: No data was found      Visit Count: Visit count could not be calculated. Make sure you are using a visit which is associated with an episode.   Diagnosis: No diagnosis found.         Precautions: No data was found  Allergies: Codeine; Morphine; Advair diskus [fluticasone-salmeterol]; Bupropion; Silenor [doxepin]; Ciprofloxacin; Egg white [albumen, egg]; Ginger; Levaquin [levofloxacin]; Soybean-containing drug products; Strawberry c [ascorbate]; Walnuts [tree nuts]; Dairy [milk-related compounds]; Eggs or egg-derived products; Gabapentin; Ioversol; Lanolin; Lidocaine; Penicillins; and Wheat extract    Past Medical History:   Diagnosis Date   . ADHD (attention deficit hyperactivity disorder)    . Anemia     constant   . Anxiety    . Arrhythmia     pt states she belives anemia led to heart flutters in the past.   . Asthma    . Depression    . Dysphagia    . Eczema    . Fibromyalgia    . Gastroesophageal reflux disease    . Headache     migraine, sensitivity to light   . Heart burn    . Hypercholesterolemia 12/24/2015   . Malignant neoplasm of colon 2009    chemo   . Malignant neoplasm of skin 2007    basal   . Neck pain    . Osteoporosis, idiopathic     Not sure.  Have had osteopenia in past but not now.   . Post-operative nausea and vomiting    . Psoriasis    . Seasonal allergic rhinitis    . Sleep apnea     does not use CPAP                                  Marcell Barlow, PT

## 2018-06-15 NOTE — PT/OT Therapy Note (Signed)
Name: Teresa [name] Age: 47 y.o.   Date of Service: 06/15/2018  Referring Physician: Caleb Popp, MD   Date of Injury: 03/30/2018  Date Care Plan Established/Reviewed: 05/28/2018  Date Treatment Started: 05/28/2018  End of Certification Date: 07/27/2018  Sessions in Plan of Care: 10  Surgery Date: No data was found    Visit Count: 3   Diagnosis:   1. Pelvic floor dysfunction        Subjective     Social Support/Occupation      Occupation: unemployed     Pt reports felt some relief after cutting out psyllium. Pt reports unable to put feet on stool secondary to L hip pain. Pt reports had to shift back and forth to have BM but able to exhale with pushing. Pt reports feeling more PF contraction and bulge throughout week, feeling more awareness, notes it is easier supine than sitting. Pt is going to move to [facility] November 2nd.       Precautions: No data was found  Allergies: Codeine; Morphine; Advair diskus [fluticasone-salmeterol]; Bupropion; Silenor [doxepin]; Ciprofloxacin; Egg white [albumen, egg]; Ginger; Levaquin [levofloxacin]; Soybean-containing drug products; Strawberry c [ascorbate]; Walnuts [tree nuts]; Dairy [milk-related compounds]; Eggs or egg-derived products; Gabapentin; Ioversol; Lanolin; Lidocaine; Penicillins; and Wheat extract    Objective     Perineal Observation   General Skin condition: no abnormal redness, discharge   Perineal Body Resting Position: descended   Voluntary contraction: present  Involuntary contraction (with cough, sneeze, laugh): present  Involuntary relaxation: present  Perineal descent (act as if releasing gas): present  Perineal descent bearing down: present    Vulvar Palpation:   Superficial layer: mild tissue tension  Deep layer: mild to moderate tissue tension, no tenderness     Contraction Ability   Voluntary contraction: moderate  MMT Brink Terminology      Right: 3+  Muscle Endurance      Right (seconds): 7  Number of quick contractions in 10 seconds:  9    Tissue Laxity Tests   Anterior wall of vagina: laxity grade 1-2   Posterior wall of vagina: laxity grade 1-2     Pt demonstrates tissue tension in B coccygeus, puborectalis in internal rectal.                      Treatment     Therapeutic Exercises   Justification: To improve:Flexibility/ROM, Stabilization and Strength   PF MMT     Neuromuscular Re-Education   Justification: For activation and/or inhibition of target muscle, to improve balance and proprioception  Supine contract/relax x20   Sitting contract/relax x20   Standing contract/relax x20   Visual, Verbal, Tactile cuing for relaxation (bulge) of PF mm and PF contraction to allow normal length-tension relationship of pelvic floor mm.           Manual Therapy   Justification: To improve joint mobility, soft tissue mobility, and reduce trigger points  STM/DTM to B coccygeus, puborectalis, obturator internus     Therapeutic Activity   Justification: Increase awareness of anatomy and PF mm contraction   Discuss in depth external and internal PF mm anatomy and physiology focusing on puborectalis, rectocele, and defecation mechanics.     Visual cuing with handheld mirror with external mm, visualization of contraction and bulge, tactile cuing with pt visualizing 3D modle while therapist palpating     Watching video of PF dyssynergia and PF contraction.          ---      ---  Total Time   Timed Minutes  40 minutes   Total Time  40 minutes        Assessment   [name] demonstrated increased PF mm strength, continued tissue tension especially R obturator and pubo-rectalis. Pt required max instruction in anatomy and mechanics of PF to increase understanding and contraction strength. Pt verbalized more understanding after session.     Pt would continue to benefit from skilled physical therapy to address tissue tension, posture mechanics, hip and core weakness to allow return to PLOF.   Plan   Add TrA strengthening and PF contraction and relaxation progression standing,  sit<->stand, squats.       Goals    Goal 1:  Pt will demonstrate 4/5 PF strength, 30 second endurance and good bulge ability to allow pt to maintain urinary and fecal continence.     3+/5 06/15/18 CW increase continence repored    Sessions:  10      Goal 2:  Pt will report pain in pelvic region to be 0-1/10 to allow return to activities including low back PT exercises.    Sessions:  10      Goal 3:  Increase abdominal strength to 4/5, good to allow patient to increase pelvic and lumbar stability to decrease inconvenience.    Sessions:  10      Goal 4:  Patient will demonstrate independence in prescribed HEP with proper form, sets and reps for safe discharge to an independent program.    Compliant every day 06/15/18 CW   Sessions:  5                                Marcell Barlow, PT

## 2018-06-15 NOTE — Progress Notes (Signed)
Have you seen any specialists/other providers since your last visit with us?    No    Arm preference verified?   Yes    The patient is due for nothing at this time, HM is up-to-date.

## 2018-06-17 ENCOUNTER — Encounter (INDEPENDENT_AMBULATORY_CARE_PROVIDER_SITE_OTHER): Payer: Self-pay | Admitting: Family Medicine

## 2018-06-18 ENCOUNTER — Encounter: Payer: Self-pay | Admitting: Hematology & Oncology

## 2018-06-21 ENCOUNTER — Encounter (INDEPENDENT_AMBULATORY_CARE_PROVIDER_SITE_OTHER): Payer: Self-pay | Admitting: Neurology

## 2018-06-21 DIAGNOSIS — G4733 Obstructive sleep apnea (adult) (pediatric): Secondary | ICD-10-CM

## 2018-06-22 ENCOUNTER — Other Ambulatory Visit: Payer: Self-pay

## 2018-06-22 ENCOUNTER — Inpatient Hospital Stay: Payer: 59 | Attending: Gastroenterology | Admitting: Rehabilitative and Restorative Service Providers"

## 2018-06-22 ENCOUNTER — Encounter: Payer: Self-pay | Admitting: Hematology & Oncology

## 2018-06-22 DIAGNOSIS — C189 Malignant neoplasm of colon, unspecified: Secondary | ICD-10-CM

## 2018-06-22 DIAGNOSIS — M6289 Other specified disorders of muscle: Secondary | ICD-10-CM | POA: Insufficient documentation

## 2018-06-22 NOTE — PT/OT Therapy Note (Signed)
Name: Teresa [name] Age: 47 y.o.   Date of Service: 06/22/2018  Referring Physician: Caleb Popp, MD   Date of Injury: 03/30/2018  Date Care Plan Established/Reviewed: 05/28/2018  Date Treatment Started: 05/28/2018  End of Certification Date: 07/27/2018  Sessions in Plan of Care: 10  Surgery Date: No data was found    Visit Count: 4   Diagnosis:   1. Pelvic floor dysfunction        Subjective     Social Support/Occupation      Occupation: unemployed     Pt reports did colon cleanse (taking a laxative, enemas) and felt more cleaned out and felt much better at voiding, still not complete but better. Pain is the same, UI is the same. Pt reports doing HEP is fine but can't tell if doing anything. Sitting down are the hardest, supine and sitting are easy. Sitting hard to tell if anything is happening. Able to do on pball to get feedback. Unable to do squatty potty secondary to L hip pain.       Precautions: No data was found  Allergies: Morphine; Advair diskus [fluticasone-salmeterol]; Bupropion; Ciprofloxacin; Codeine; Egg white [albumen, egg]; Ginger; Levaquin [levofloxacin]; Silenor [doxepin]; Soybean-containing drug products; Strawberry c [ascorbate]; Walnuts [tree nuts]; Dairy [milk-related compounds]; Eggs or egg-derived products; Gabapentin; Ioversol; Lanolin; Lidocaine; Penicillins; and Wheat extract    Objective     Perineal Observation   General Skin condition: no abnormal redness, discharge   Perineal Body Resting Position: descended   Voluntary contraction: present  Involuntary contraction (with cough, sneeze, laugh): present  Involuntary relaxation: present  Perineal descent (act as if releasing gas): present  Perineal descent bearing down: present    Vulvar Palpation:   Superficial layer: mild tissue tension  Deep layer: mild to moderate tissue tension, no tenderness     Contraction Ability   Voluntary contraction: moderate  MMT Brink Terminology      Right: 3+  Muscle Endurance      Right  (seconds): 7  Number of quick contractions in 10 seconds: 9    Tissue Laxity Tests   Anterior wall of vagina: laxity grade 1-2   Posterior wall of vagina: laxity grade 1-2     Pt demonstrates tissue tension in B coccygeus, puborectalis in internal rectal.                      Treatment     Therapeutic Exercises   Justification: To improve:Flexibility/ROM, Stabilization and Strength   Updated HEP and reviewed with pt     Neuromuscular Re-Education   Justification: For activation and/or inhibition of target muscle, to improve balance and proprioception  TrA 3" hold x10     TrA heel lift 2x30"     TrA toe lift 2x30"   Verbal, Visual, Tactile cuing to increase TrA contraction in neutral pelvis to allow lumbo-pelvic stability    Supine PF contract/relax/bulge/relax x20     R bent knee fallouts with tra contraction x20     Manual Therapy   Justification: To improve joint mobility, soft tissue mobility, and reduce trigger points  STM to L hip flexor, glute med, ITB     Gentle proximal distraction very mild at femur         Access Code: [email or web address]   URL: [email or web address]   Date: 06/22/2018   Prepared by: Marcell Barlow     Exercises  Supine Transversus Abdominis Bracing - Hands on Stomach - 3 sets - 20 hold -  1x daily - 7x weekly  Bent Knee Fallouts - 3 sets - 20 hold - 1x daily - 7x weekly  Supine March - 3 sets - 20 hold - 1x daily - 7x weekly       ---      ---   Total Time   Timed Minutes  46 minutes   Total Time  46 minutes        Assessment   Adleigh demonstrate increased tissue tension hip flexor that responded poorly in MT. Pt reported some mild discomfort to gentle distraction, it was ceased and then proceeded to core strengthening. Pt required 5 min break secondary to increase in L hip pain. Pt required bolster under knees to prevent hyperextension and towel roll at lateral hip to prevent ER. Pt able to do some PF contract/relax supine after 5 min rest break.     Pt would continue to benefit from  skilled physical therapy to address tissue tension, posture mechanics, hip and core weakness to allow return to PLOF.   Plan   Progress to PF contraction and relaxation progression standing, sit<->stand, squats when able       Goals    Goal 1:  Pt will demonstrate 4/5 PF strength, 30 second endurance and good bulge ability to allow pt to maintain urinary and fecal continence.     3+/5 06/15/18 CW increase continence repored    Sessions:  10      Goal 2:  Pt will report pain in pelvic region to be 0-1/10 to allow return to activities including low back PT exercises.    Sessions:  10      Goal 3:  Increase abdominal strength to 4/5, good to allow patient to increase pelvic and lumbar stability to decrease inconvenience.     Poor abdominal strength, required max encouragement and cuing 06/22/18 CW   Sessions:  10      Goal 4:  Patient will demonstrate independence in prescribed HEP with proper form, sets and reps for safe discharge to an independent program.    Compliant every day 06/15/18 CW   Sessions:  5                                Marcell Barlow, PT

## 2018-06-24 NOTE — Progress Notes (Signed)
Sleep study re-ordered with notation for auto-pap.

## 2018-06-25 ENCOUNTER — Encounter (INDEPENDENT_AMBULATORY_CARE_PROVIDER_SITE_OTHER): Payer: Self-pay

## 2018-06-25 ENCOUNTER — Encounter (INDEPENDENT_AMBULATORY_CARE_PROVIDER_SITE_OTHER): Payer: Self-pay | Admitting: Family Medicine

## 2018-06-29 ENCOUNTER — Ambulatory Visit: Payer: 59

## 2018-06-29 ENCOUNTER — Inpatient Hospital Stay: Payer: 59 | Attending: Gastroenterology | Admitting: Rehabilitative and Restorative Service Providers"

## 2018-06-29 ENCOUNTER — Telehealth (INDEPENDENT_AMBULATORY_CARE_PROVIDER_SITE_OTHER): Payer: Self-pay

## 2018-06-29 DIAGNOSIS — M6289 Other specified disorders of muscle: Secondary | ICD-10-CM

## 2018-06-29 NOTE — Telephone Encounter (Addendum)
Called patient, she has not selected DME provider yet. She is moving to [facility] and will want one that can service her there. Gave her a few options [facility], [facility]). She also is requesting an APAP machine with A-Flex to be noted on prescription.  She will call back with DME info.      ----- Message from Francella Solian, LPN sent at 16/06/9603  2:19 PM EDT -----  Regarding: FW: Sleep study  Did you take care of this?   ----- Message -----  From: Edmon Crape, MD  Sent: 06/25/2018   4:06 PM EDT  To: Renato Battles, LPN, Francella Solian, LPN  Subject: RE: Sleep study                                  That would be great, thank you.  So, I'll leave it to you and/or Burdell Peed to set this up.   Have a good weekend.   LCM  ----- Message -----  From: Francella Solian, LPN  Sent: 54/05/8118   2:52 PM EDT  To: Edmon Crape, MD  Subject: RE: Sleep study                                  Either myself or Jasmaine Rochel can help the pt order the Auto pap from a DME she does not need another study  ----- Message -----  From: Edmon Crape, MD  Sent: 06/25/2018  12:28 PM EDT  To: Francella Solian, LPN  Subject: RE: Sleep study                                  How do I order an Auto pap study specifically?  ----- Message -----  From: Francella Solian, LPN  Sent: 14/78/2956  11:21 AM EDT  To: Edmon Crape, MD  Subject: RE: Sleep study                                  It looks like the pt had a HST and was then denied for a titration study and told to use an Auto PAP by insurance just to treat the OSA. But the HST does not collect data for central Apneas and RLS, so she can have an Auto PAP which does not require another sleep study but this may not work for her and she would have to have a one month trail and failure of the Apap and then be re-authorized for in lab which should be approved.     So setting her up for Auto pap would be her alternative right now and if she fails it then a titration could be reordered. This would just treat her  OSA however and partially her central Apneas but RLS would be undocumented. I don't see the previous sleep study that she had done that she referenced her RLS or central apnea, I only see an HST that should she had upper airway resistance syndrome.   ----- Message -----  From: Edmon Crape, MD  Sent: 06/25/2018   9:25 AM EDT  To: Francella Solian, LPN  Subject: Sleep study  Hi Fleet Contras,  [place] you are having a good Friday.  I'm trying to follow all of the back and forth regarding Ms. Ante sleep study.  My understanding is that both the home sleep study and the in-lab sleep study were denied by her insurance.  Is there an alternative course of action?  LCM

## 2018-06-29 NOTE — PT/OT Therapy Note (Signed)
Name: Teresa [name] Age: 47 y.o.   Date of Service: 06/29/2018  Referring Physician: Caleb Popp, MD   Date of Injury: 03/30/2018  Date Care Plan Established/Reviewed: 05/28/2018  Date Treatment Started: 05/28/2018  End of Certification Date: 07/27/2018  Sessions in Plan of Care: 10  Surgery Date: No data was found    Visit Count: 5   Diagnosis:   1. Pelvic floor dysfunction        Subjective     Social Support/Occupation      Occupation: unemployed     Pt reports L hip pain has continued, stopped doing rotation of hip. Continues to do side to side. Pt reports no change in PF.       Precautions: No data was found  Allergies: Morphine; Advair diskus [fluticasone-salmeterol]; Bupropion; Ciprofloxacin; Codeine; Egg white [albumen, egg]; Ginger; Levaquin [levofloxacin]; Silenor [doxepin]; Soybean-containing drug products; Strawberry c [ascorbate]; Walnuts [tree nuts]; Dairy [milk-related compounds]; Eggs or egg-derived products; Gabapentin; Ioversol; Lanolin; Lidocaine; Penicillins; and Wheat extract    Objective     Perineal Observation   General Skin condition: no abnormal redness, discharge   Perineal Body Resting Position: descended   Voluntary contraction: present  Involuntary contraction (with cough, sneeze, laugh): present  Involuntary relaxation: present  Perineal descent (act as if releasing gas): present  Perineal descent bearing down: present    Vulvar Palpation:   Superficial layer: mild tissue tension  Deep layer: mild to moderate tissue tension, no tenderness     Contraction Ability   Voluntary contraction: moderate  MMT Brink Terminology      Right: 3+  Muscle Endurance      Right (seconds): 7  Number of quick contractions in 10 seconds: 9    Tissue Laxity Tests   Anterior wall of vagina: laxity grade 1-2   Posterior wall of vagina: laxity grade 1-2     Pt demonstrates tissue tension in B coccygeus, puborectalis in internal rectal.                      Treatment     Therapeutic Exercises    Justification: To improve:Flexibility/ROM, Stabilization and Strength   Updated HEP and reviewed with pt     Neuromuscular Re-Education   Justification: For activation and/or inhibition of target muscle, to improve balance and proprioception  TrA 3" hold x10     TrA heel lift 2x30"     TrA toe lift 2x30"   Verbal, Visual, Tactile cuing to increase TrA contraction in neutral pelvis to allow lumbo-pelvic stability    Supine PF contract/relax/bulge/relax x20     R bent knee fallouts with tra contraction x20     Manual Therapy   Justification: To improve joint mobility, soft tissue mobility, and reduce trigger points  STM to L hip flexor, glute med, ITB     Gentle proximal distraction very mild at femur         Access Code: [email or web address]   URL: [email or web address]   Date: 06/22/2018   Prepared by: Marcell Barlow     Exercises  Supine Transversus Abdominis Bracing - Hands on Stomach - 3 sets - 20 hold - 1x daily - 7x weekly  Bent Knee Fallouts - 3 sets - 20 hold - 1x daily - 7x weekly  Supine March - 3 sets - 20 hold - 1x daily - 7x weekly       ---      ---   Total Time   Timed  Minutes  42 minutes   Total Time  42 minutes        Assessment   [name] demonstrate increased tissue tension hip flexor that responded poorly in MT. Pt reported some mild discomfort to gentle distraction, it was ceased and then proceeded to core strengthening. Pt required 5 min break secondary to increase in L hip pain. Pt required bolster under knees to prevent hyperextension and towel roll at lateral hip to prevent ER. Pt able to do some PF contract/relax supine after 5 min rest break.     Pt would continue to benefit from skilled physical therapy to address tissue tension, posture mechanics, hip and core weakness to allow return to PLOF.   Plan   Progress to PF contraction and relaxation progression standing, sit<->stand, squats when able       Goals    Goal 1:  Pt will demonstrate 4/5 PF strength, 30 second endurance and good bulge  ability to allow pt to maintain urinary and fecal continence.     3+/5 06/15/18 CW increase continence repored    Sessions:  10      Goal 2:  Pt will report pain in pelvic region to be 0-1/10 to allow return to activities including low back PT exercises.    Sessions:  10      Goal 3:  Increase abdominal strength to 4/5, good to allow patient to increase pelvic and lumbar stability to decrease inconvenience.     Poor abdominal strength, required max encouragement and cuing 06/22/18 CW   Sessions:  10      Goal 4:  Patient will demonstrate independence in prescribed HEP with proper form, sets and reps for safe discharge to an independent program.    Compliant every day 06/15/18 CW   Sessions:  5                                Marcell Barlow, PT

## 2018-06-30 ENCOUNTER — Telehealth (INDEPENDENT_AMBULATORY_CARE_PROVIDER_SITE_OTHER): Payer: Self-pay

## 2018-06-30 NOTE — Telephone Encounter (Signed)
Patient advised she is moving to [place] soon and would like her sleep study with documentation faxed to her new physician, [facility] at [phone] - done

## 2018-07-01 ENCOUNTER — Ambulatory Visit: Payer: 59

## 2018-07-06 ENCOUNTER — Ambulatory Visit (INDEPENDENT_AMBULATORY_CARE_PROVIDER_SITE_OTHER): Payer: 59 | Admitting: Neurology

## 2018-07-06 ENCOUNTER — Encounter (INDEPENDENT_AMBULATORY_CARE_PROVIDER_SITE_OTHER): Payer: Self-pay | Admitting: Neurology

## 2018-07-06 ENCOUNTER — Inpatient Hospital Stay: Payer: 59 | Admitting: Rehabilitative and Restorative Service Providers"

## 2018-07-06 VITALS — BP 114/80 | HR 77 | Ht 61.5 in | Wt 138.0 lb

## 2018-07-06 DIAGNOSIS — G2581 Restless legs syndrome: Secondary | ICD-10-CM

## 2018-07-06 DIAGNOSIS — G43119 Migraine with aura, intractable, without status migrainosus: Secondary | ICD-10-CM

## 2018-07-06 DIAGNOSIS — G4733 Obstructive sleep apnea (adult) (pediatric): Secondary | ICD-10-CM

## 2018-07-06 NOTE — Progress Notes (Addendum)
07/06/2018    Chief Complaint:   Chief Complaint   Patient presents with   . Migraine     History of Present Illness:  Teresa [name] is a 47 y.o. right-handed female who presents for follow up of multiple neurological complaints.  She was previously seen on 05/07/2018.      Will be moving to [place] in several weeks.   Has questions regarding establishing care there, what symptoms to look for in regards to her sleep apnea and restless legs.   Headaches unchanged.   Has history of restless legs.   Not sure if her ferritin/iron studies have been checked.   No other new concerns.     Initial Visit History 01/15/2018:  Head pulls back and to the left.  Has associated neck pain and stiffness.    Makes it hard for her to sleep.   She had Botox injections at [facility] x 3.   First two, helped a little.   Last time, had dose increase and "this was disastrous".   Has a history of RLS.   Thinks she may also have PLMS.   Since 2008, has felt tremors in her legs.   This is an internal sensation; feels like her legs are vibrating.   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   Previously diagnosed by migraines.   Had headaches early in her life while on OCPs in her 66s.   Headaches recurred in her 40s.   She always has low level nausea, scintillating scotomas, light sensitivity.   Severity of these fluctuates.   Headaches consist of pain starting in her suboccipital region.   Radiates around her head.   Feels like a tightness, as if her brain was swollen or her scalp too tight.   These headaches were daily until she started wearing an ace bandage to help with jaw hypermobility.   Can progress to her feeling like a pressure over the vertex, like a bowling ball is sitting on top of her head.  This happens three times a week. Can last all day.   At times this progresses even further to feeling that she has railroad spike feeling over her left eye.  Can also occur on right or in the middle but less frequently.  These can be  triggered by strawberries or tofu. Otherwise happens about 1 per week.  Lasts about an hour.   With these, face feels congested, left eye is twitching, has a droopy feeling.  Joint pain is severe.   No conjunctival tearing, ingestion, rhinorrhea.   Also gets "migraines", where she has these symptoms to a more severe degree and also has sound sensitivity. With these, she may need to lay down in dark room or she may be restless, "like I'm crawling out of my skin", and needs to pace. These occur maybe once every 6 months.  Better since going off of Bupropion.   Headaches were previously helped by Tylenol but she had elevation in her liver enzymes, so she can no longer take that.   Sometimes, if she takes a Benadryl when she gets the swelling in her head feeling, it helps.   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   Using Xyzal, Atarax, Pepcid bid; started these in February or later.   Not sure if these are helping her headaches.   She is also seeing cardiology for dysautonomia.   Also noting some numbness, tingling in hands.   Worse on the right.   Lateral  aspect of right elbow radiating into right 4th and 5th digits.   Makes it hard for her sleep.     Review of Systems:  Constitutional: Negative for fever. +Low energy level. +Weight change.   Respiratory: +Asthma. +Shortness of breath.  +History of chronic bronchitis.   Cardiovascular: +Chest pain. +Irregular rhythm. +High blood pressure.  Gastrointestinal: Negative for abdominal pain. +Diarrhea. +Constipation.   Neurological: Negative for visual changes, alteration in awareness, speech or language problems, facial droop, focal weakness, limb incoordination,dysphagia, or dizziness. +Neck pain. ?Seizure. +Headaches. +Back pain. +Tremor. +Balance trouble. +Numbness/tingling. +Trouble sleeping. +RLS.   Psychiatric/Behavioral: +Depression.   Also: Cancer. Arthritis. Anxiety. Anemia. Bladder problem.  Rash. Hypermobile Ehlers-Danlos syndrome.  Fibromyalgia. Migraines. Dysautonomia. 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 Bilateral tinnitus.  Pulsatile tinnitus in left ear.   All other systems reviewed and are negative except pertinent positives as noted above in HPI.     Past Medical History:  Past Medical History:   Diagnosis Date   . ADHD (attention deficit hyperactivity disorder)    . Anemia     constant   . Anxiety    . Arrhythmia     pt states she belives anemia led to heart flutters in the past.   . Asthma    . Depression    . Dysphagia    . Eczema    . Fibromyalgia    . Gastroesophageal reflux disease    . Headache     migraine, sensitivity to light   . Heart burn    . Hypercholesterolemia 12/24/2015   . Malignant neoplasm of colon 2009    chemo   . Malignant neoplasm of skin 2007    basal   . Neck pain    . Osteoporosis, idiopathic     Not sure.  Have had osteopenia in past but not now.   . Post-operative nausea and vomiting    . Psoriasis    . Seasonal allergic rhinitis    . Sleep apnea     does not use CPAP     Past Surgical History:  Past Surgical History:   Procedure Laterality Date   . APPENDECTOMY     . COLECTOMY  2009    partial   . COLONOSCOPY  2014   . COLONOSCOPY N/A 11/13/2014    Procedure: COLONOSCOPY;  Surgeon: Huntley Estelle, MD;  Location: [facility];  Service: Gastroenterology;  Laterality: N/A;   . EGD  2014   . EGD, DILATION N/A 11/13/2014    Procedure: EGD, DILATION;  Surgeon: Huntley Estelle, MD;  Location: [facility];  Service: Gastroenterology;  Laterality: N/A;  EGD, DILATION/COLONOSCOPY W/IVA   . REDUCTION MAMMAPLASTY  2006   . uterine ablation   2011     Allergies:  Morphine; Advair diskus [fluticasone-salmeterol]; Bupropion; Ciprofloxacin; Codeine; Egg white [albumen, egg]; Ginger; Levaquin [levofloxacin]; Silenor [doxepin]; Soybean-containing drug products; Strawberry c [ascorbate]; Walnuts [tree nuts]; Dairy [milk-related compounds]; Eggs or egg-derived products; Gabapentin; Ioversol; Lanolin;  Lidocaine; Penicillins; and Wheat extract    Family History:  Family History   Problem Relation Age of Onset   . Colon cancer Father    . Cancer Father         colon   . Early death Father         Age 41, colon cancer   . Depression Mother    . Diabetes Mother         Pre-diabetes; not diabetes   . Heart disease Mother    .  Hyperlipidemia Mother    . Vision loss Mother         cataracts and glaucoma   . Obesity Maternal Aunt    No other family history related to current complaints.    Social History:  Married.  On medical leave. No tobacco, alcohol, drug abuse.     Medications:  Current Outpatient Medications   Medication Sig Dispense Refill   . acetaminophen (TYLENOL) 500 MG tablet Take 500 mg by mouth daily     . albuterol (PROVENTIL HFA;VENTOLIN HFA) 108 (90 Base) MCG/ACT inhaler Inhale 2 puffs into the lungs every 4 (four) hours as needed for Wheezing. 1 Inhaler 0   . amitriptyline (ELAVIL) 10 MG tablet Take 1 tablet (10 mg total) by mouth nightly 90 tablet 1   . ascorbic acid (VITAMIN C) 500 MG tablet Take 625 mg by mouth daily.Using powder formulation     . B Complex Vitamins (VITAMIN-B COMPLEX PO) Take by mouth     . betamethasone, augmented, (DIPROLENE) 0.05 % cream APPLY TWICE DAILY AS NEEDED TO THE AFFECTED AREAS FOR 5-7 DAYS FOR ECZEMA.  2   . Blood Pressure Monitoring (BLOOD PRESSURE CUFF) Misc Take blood pressure as needed for symptoms. 1 each 0   . Boswellia Serrata Extract Powder Take 150 mg by mouth daily.         . Calcium Citrate 200 MG Tab Take 400 mg by mouth 2 (two) times daily.Powder formulation         . cromolyn (GASTROCROM) 100 MG/5ML solution      . diphenhydrAMINE (BENADRYL) 12.5 MG chewable tablet Chew 12.5 mg by mouth 4 (four) times daily as needed.     Marland Kitchen EPINEPHrine 0.3 MG/0.3ML Solution Auto-injector injection INJECT ONE AUTO-INJECTOR INTRAMUSCULARLY AS NEEDED  4   . estradiol (ESTRACE) 0.1 MG/GM vaginal cream Place 1 g vaginally twice a week. 42.5 g 4   . famotidine (PEPCID) 20 MG  tablet Take 20 mg by mouth 2 (two) times daily     . ferrous sulfate 325 (65 FE) MG tablet Take 325 mg by mouth every morning with breakfast     . fexofenadine (ALLEGRA) 180 MG tablet Take 180 mg by mouth daily     . guaifenesin (ROBITUSSIN) 100 MG/5ML liquid Take 200 mg by mouth 4 (four) times daily as needed for Congestion.     . hydrOXYzine (ATARAX) 50 MG tablet Take 50 mg by mouth nightly     . L-Lysine 1000 MG Tab Take 1,300 mg by mouth daily.        Marland Kitchen L-Theanine 100 MG Cap Take 200 mg by mouth daily.         Marland Kitchen levocetirizine (XYZAL) 5 MG tablet Take 5 mg by mouth 2 (two) times daily         . Light Mineral Oil-Mineral Oil (RETAINE MGD) 0.5-0.5 % Emulsion Apply 1 drop to eye nightly.     . Magnesium Citrate 200 MG Tab Take by mouth     . Melatonin 1 MG/ML Liquid Take 0.5 drops by mouth nightly.     . montelukast (SINGULAIR) 10 MG tablet Take 1 tablet (10 mg total) by mouth daily 90 tablet 1   . PREVIDENT 5000 BOOSTER PLUS 1.1 % Paste APPLY TO TEETH AFTER REGULAR BRUSHING AND FLOSSING  0   . pseudoephedrine (SUDAFED) 30 MG tablet Take 30 mg by mouth every 4 (four) hours as needed for Congestion.     Marland Kitchen pyridostigmine (MESTINON) 60 MG tablet Take  15 mg by mouth 5 (five) times daily        . RESTASIS 0.05 % ophthalmic emulsion INSTILL 1 DROP TWICE DAILY IN BOTH EYES  3   . triamcinolone acetonide (NASACORT ALLERGY 24HR) 55 MCG/ACT nasal inhaler 1 spray by Nasal route daily.         . vitamin D, cholecalciferol, 400 units tablet Take 400 Units by mouth daily       No current facility-administered medications for this visit.      General Exam:  BP 114/80 (BP Site: Right arm, Patient Position: Sitting, Cuff Size: Medium)   Pulse 77   Ht 1.562 m (5' 1.5")   Wt 62.6 kg (138 lb)   BMI 25.65 kg/m   Deferred.     Investigations:  Labs:  Lab Results   Component Value Date    WBC 9.38 05/11/2018    HGB 13.4 05/11/2018    HCT 42.2 05/11/2018    MCV 90.9 05/11/2018    PLT 338 05/11/2018     '  Chemistry        Component  Value Date/Time    NA 140 05/11/2018 1144    K 4.2 05/11/2018 1144    CL 103 05/11/2018 1144    CO2 27 05/11/2018 1144    BUN 11.0 05/11/2018 1144    CREAT 0.8 05/11/2018 1144    GLU 94 05/11/2018 1144        Component Value Date/Time    CA 10.2 05/11/2018 1144    ALKPHOS 101 05/11/2018 1144    AST 27 05/11/2018 1144    ALT 20 05/11/2018 1144    BILITOTAL 0.4 05/11/2018 1144        Imaging:  MRI Cervical Spine Without Contrast 07/29/2017   (I personally reviewed source images and I agree with the radiologist's interpretation as noted below):  Impression:  1. No segmental instability identified.   2. Small disc herniations at the C3-4, C4-C5, and C7-T1 levels without stenosis.  3. Spondylotic change at C6-7 level with mild bilateral neuroforaminal stenosis.     MRI Brain With and Without Contrast 11/25/2016   (I personally reviewed source images and I agree with the radiologist's interpretation as noted below):  IMPRESSION:   1. Normal MR examination of the internal auditory canals and  cerebellopontine angles.  2. No acute intracranial abnormality. Minimal nonspecific white matter  changes with differential considerations as discussed.    Other Studies:  EMG/NCS 03/24/2018: Normal study.     Impression:  In summary, Teresa [name] is a 47 y.o. female with a complicated medical history who presents for follow up of multiple complaints including right>left upper extremity tingling, headaches, h/o cervical dystonia, RLS and h/o OSA.     Recommendations:  1. Intractable migraine with aura without status migrainosus  -Continue magnesium and riboflavin for migraine prevention    2. OSA (obstructive sleep apnea)  3. Restless legs  - General sleep study; Future-->appeal pending.   - Recommend checking iron studies as RLS can be caused by iron deficiency; if found and corrected, this could improve RLS symptoms  - If no anemia found on iron studies, consider medication treatment for RLS    Patient is moving to [place] in 2 weeks and plans on establishing care with new providers there.  Follow up with me as needed.     De Hollingshead, MD  [facility]  Board Certified in Adult Neurology by the American Board of Psychiatry and Neurology (ABPN)  Board Certified in Headache Medicine by the [employer] (UCNS)  07/06/2018    Over 50% of the 15 minute office visit was spent in counseling and coordination of care, particularly in the form of reviewing outside records, legal documents, and letter generation.

## 2018-07-06 NOTE — Patient Instructions (Signed)
Thank you for visiting the [facility].  It was a pleasure seeing you today!      Our Plan:  -Follow the SEEDS for success in headache management, including Sleep hygiene, Exercising regularly, Eating healthy and regular meals, Drinking water, keeping a headache Diary, and Stress reduction.    -Follow up with me as needed    Results:  If you have completed any imaging, labs, or other neurological testing, I will always go over your results with you at your follow up visit. If you would like to access your results prior to that, please sign up for My Chart access at home.     Patient Satisfaction Survey:  If you receive a patient satisfaction survey, I would greatly appreciate it if you would complete it.     We value your feedback! Let me know if there are things we could have done better for your office visit.    Contact Me Online:  [facility] now has a patient portal called MyChart.  Please considering signing up for MyChart.  This is the best way to communicate with me, including getting test results, requesting medication refills, or for questions and concerns that come up between our office visits.     Of note, MyChart should not be used if you are having a medical emergency -- in that case call 911.      Follow Up Appointments:  We allocate your appointment time specifically for you and your needs.  Please be courteous and call the office at least 24 hours in advance if you are unable to make it to your appointment and you need to reschedule.      New Medications and Refills:  I recommend that you call the pharmacy before picking up any medications that were called in or electronically faxed during your office visit in order to verify that your prescription was received and that it is ready for pick up.     Medication Side Effects/Reactions:   Stop taking your medication if you are concerned that you may be having an allergic reaction or if you are having significant side effects.  Please  call the office ASAP to let me know that you are having problems with the medication.     For Next Visit:  Please remember to bring your medication bottles or an up-to-date medication list to your next visit.     Thank you for entrusting me with your health and for giving me the privilege of caring for you today!

## 2018-07-07 ENCOUNTER — Telehealth (INDEPENDENT_AMBULATORY_CARE_PROVIDER_SITE_OTHER): Payer: Self-pay

## 2018-07-07 NOTE — Telephone Encounter (Signed)
Wrong info given for [place] provider. She now wants the prescription sent to DME provider.    [facility] fax: [phone] - done

## 2018-07-08 ENCOUNTER — Ambulatory Visit
Admission: RE | Admit: 2018-07-08 | Discharge: 2018-07-08 | Disposition: A | Discharge status: Home / Self Care | Payer: 59 | Source: Ambulatory Visit | Attending: Gastroenterology | Admitting: Gastroenterology

## 2018-07-08 DIAGNOSIS — G4733 Obstructive sleep apnea (adult) (pediatric): Secondary | ICD-10-CM | POA: Insufficient documentation

## 2018-07-08 DIAGNOSIS — R1312 Dysphagia, oropharyngeal phase: Secondary | ICD-10-CM | POA: Insufficient documentation

## 2018-07-08 DIAGNOSIS — G2581 Restless legs syndrome: Secondary | ICD-10-CM | POA: Insufficient documentation

## 2018-07-08 NOTE — SLP Progress Note (Signed)
[facility]  [address]  [place], [place] [address]  [phone]  [phone] (fax)    SLP DAILY TREATMENT NOTE    Patient: Teresa Welge JacksonMRN#: [id number]  Referring MD:Dr. Kern Alberta   Start of Care:06/02/18    Medical Dx: Dysphagia, oropharyngeal phase [R13.12]   Therapy Dx: Oropharyngeal dysphagia    Number of treatments:  2 on this POC   Certification Period: 06/02/18 - 09/01/18  Treatment Type: dysphagia tx    Recommendations/Plan:   Diet Solid Recommendations: Continue with current diet;mechanical soft   Liquid Recommendations:  thin consistency, from a cup  Administration of Medications: PO, whole, with puree  Precautions/Compensations: Awake/alert;Upright 90 degrees for all oral intake;Alternate solids and liquids;Swallow multiple times per bite/sip;Small bites/sips;Eat/feed slowly;No straws;Effortful swallow  Follow up treatments: pharyngeal exercises;diet monitoring;strategies training       Assessment:   Patient continues with mild oropharyngeal dysphagia c/w recent VFSS.  Reviewed exercises - pt completing all independently.  Educated patient re: muscle activation for each exercise per her request.  Implements compensatory strategies independently across all trials.  Suspect current swallow function is baseline in the setting of known connective tissue disorder (EDS).  Patient to continue with mech soft/thin, meds whole with applesauce.  No further skilled services indicated at this time, however patient may want to consider pursuing further SLP with [facility] for increased feedback re: muscle activation.  D/C SLP.    Subjective:   Pain: denies pain   Change in medications?: None reported  Change in condition/hospitalization?: None reported    Pt arrived to session on time. She was pleasant and engaged.  Patient donned jaw support garment.     Objective:    Objective:   SLP reviewed exercises. Patient completed as follows independently :  Lingual Resistance Stretches (Up and anterior only): 10/10  Effortful: 10/10 - no teeth clenching  Mendelsohn: 10/10 after initial correction for 1 swallow vs. 2  Masako: 10/10 - improved    PO Trials: Water x4 oz, peaches x10  Oral phase: prolonged  Pharyngeal phase: mildly delayed, adequate elevaiton to palpation  Strategies: Min fade to independent for effortful  S/s aspiration: None      Patient Education:  Verbal. Patient educated re: results and recommendations.  Acknowledged understanding and agreement with POC.          Long Term Goal(s) to be completed by 06/26/2018 or by discharge from SLP services:    1. Pt will demonstrate use of recommended strategies with safest and least restrictive diet consistency with functional independence and no clinical concern for dysphagia-related complications. MET  2. Pt will participate in further education, HEP, evaluation, and treatment as clinically indicated throughout course of treatment. MET    Short Term Goal(s) to be completed by 10/11/2019or within 10 visits:    1. Pt will demonstrate use of effortful swallowacross 10 trials of mechanical soft solidswithout significant clinical concern for aspiration or dysphagia-related complications. MET  2. Pt will verbalize understanding of results and recommendations from VFSS. MET  3. Pt will verbalize understanding of s/s of aspiration and 3 pillars of aspiration pneumonia and aspiration pneumonia prevention. MET  4. Pt will complete pharyngeal strengthening exercise program with 100% acc independently. MET              Thank you for including me in the care of Yaelis Tiegs.    Arva Chafe, MS CCC-SLP  [place] Lic# [id number]       Time of Treatment:  SLP Received On: 07/08/18  Start Time: 0800  Stop Time: 0845  Time Calculation (min): 45 min

## 2018-07-09 ENCOUNTER — Encounter (INDEPENDENT_AMBULATORY_CARE_PROVIDER_SITE_OTHER): Payer: Self-pay | Admitting: Neurology

## 2018-07-10 ENCOUNTER — Other Ambulatory Visit: Payer: Self-pay | Admitting: Gastroenterology

## 2018-07-12 ENCOUNTER — Ambulatory Visit (INDEPENDENT_AMBULATORY_CARE_PROVIDER_SITE_OTHER): Payer: 59 | Admitting: Neurology

## 2018-07-13 ENCOUNTER — Inpatient Hospital Stay: Payer: 59 | Admitting: Rehabilitative and Restorative Service Providers"

## 2018-07-13 ENCOUNTER — Inpatient Hospital Stay: Payer: 59 | Attending: Gastroenterology | Admitting: Rehabilitative and Restorative Service Providers"

## 2018-07-13 DIAGNOSIS — M6289 Other specified disorders of muscle: Secondary | ICD-10-CM | POA: Insufficient documentation

## 2018-07-13 NOTE — PT/OT Therapy Note (Signed)
Name: Teresa [name] Age: 47 y.o.   Date of Service: 07/13/2018  Referring Physician: Caleb Popp, MD   Date of Injury: 03/30/2018  Date Care Plan Established/Reviewed: 05/28/2018  Date Treatment Started: 05/28/2018  End of Certification Date: 07/27/2018  Sessions in Plan of Care: 10  Surgery Date: No data was found    Visit Count: 6   Diagnosis:   1. Pelvic floor dysfunction        Subjective     Social Support/Occupation      Occupation: unemployed     Long thin BM, nothing makes me feel that it is helping. Doing bulges on toilet will cause some small BM occasionally that did not expect, has to rock back and forth to have a BM. Pt reports this will be last appointment secondary to moving to [facility].       Precautions: No data was found  Allergies: Morphine; Advair diskus [fluticasone-salmeterol]; Bupropion; Ciprofloxacin; Codeine; Egg white [albumen, egg]; Ginger; Levaquin [levofloxacin]; Silenor [doxepin]; Soybean-containing drug products; Strawberry c [ascorbate]; Walnuts [tree nuts]; Dairy [milk-related compounds]; Eggs or egg-derived products; Gabapentin; Ioversol; Lanolin; Lidocaine; Penicillins; and Wheat extract    Objective     Perineal Observation   General Skin condition: no abnormal redness, discharge   Perineal Body Resting Position: descended   Voluntary contraction: present  Involuntary contraction (with cough, sneeze, laugh): present  Involuntary relaxation: present  Perineal descent (act as if releasing gas): present  Perineal descent bearing down: present    Vulvar Palpation:   Superficial layer: mild tissue tension  Deep layer: mild to moderate tissue tension, no tenderness     Contraction Ability   Voluntary contraction: moderate  MMT Brink Terminology      Right: 3+  Muscle Endurance      Right (seconds): 7  Number of quick contractions in 10 seconds: 9    Tissue Laxity Tests   Anterior wall of vagina: laxity grade 1-2   Posterior wall of vagina: laxity grade 1-2     Pt  demonstrates tissue tension in B coccygeus, puborectalis in internal rectal.                      Treatment     Therapeutic Exercises   Justification: To improve:Flexibility/ROM, Stabilization and Strength   FOTO assessment (read to pt secondary to difficulty holding with UE and visual deficits) from 47% to 17%.             Neuromuscular Re-Education   Justification: For activation and/or inhibition of target muscle, to improve balance and proprioception  TrA 3" hold x10     TrA heel lift 2x30"     TrA toe lift 2x30"   Verbal, Visual, Tactile cuing to increase TrA contraction in neutral pelvis to allow lumbo-pelvic stability    Supine PF contract/relax/bulge/relax x20     R bent knee fallouts with tra contraction x20     Manual Therapy   Justification: To improve joint mobility, soft tissue mobility, and reduce trigger points    Therapeutic Activity   Justification: Increase BM without straining.   Discussion about low back positions influence of widening or narrowing rectal canal. Discussion with pt about attempting lumbar extension with anterior weight shift for increased anorectal angle.     Access Code: [email or web address]   URL: [email or web address]   Date: 06/22/2018   Prepared by: Marcell Barlow     Exercises  Supine Transversus Abdominis Bracing - Hands on Stomach - 3  sets - 20 hold - 1x daily - 7x weekly  Bent Knee Fallouts - 3 sets - 20 hold - 1x daily - 7x weekly  Supine March - 3 sets - 20 hold - 1x daily - 7x weekly       ---      ---   Total Time   Timed Minutes  36 minutes   Total Time  36 minutes        Assessment   Kimmarie demonstrate increased tissue tension hip flexor that responded poorly in MT. Pt reported some mild discomfort to gentle distraction, it was ceased and then proceeded to core strengthening. Pt required 5 min break secondary to increase in L hip pain. Pt required bolster under knees to prevent hyperextension and towel roll at lateral hip to prevent ER. Pt able to do some PF  contract/relax supine after 5 min rest break.     Pt would continue to benefit from skilled physical therapy to address tissue tension, posture mechanics, hip and core weakness to allow return to PLOF.   Plan   Progress to PF contraction and relaxation progression standing, sit<->stand, squats when able       Goals    Goal 1:  Pt will demonstrate 4/5 PF strength, 30 second endurance and good bulge ability to allow pt to maintain urinary and fecal continence.     3+/5 06/15/18 CW increase continence repored    Sessions:  10      Goal 2:  Pt will report pain in pelvic region to be 0-1/10 to allow return to activities including low back PT exercises.    Sessions:  10      Goal 3:  Increase abdominal strength to 4/5, good to allow patient to increase pelvic and lumbar stability to decrease inconvenience.     Poor abdominal strength, required max encouragement and cuing 06/22/18 CW   Sessions:  10      Goal 4:  Patient will demonstrate independence in prescribed HEP with proper form, sets and reps for safe discharge to an independent program.    Compliant every day 06/15/18 CW   Sessions:  5                                Marcell Barlow, PT

## 2018-07-15 DIAGNOSIS — Q7962 Hypermobile Ehlers-Danlos syndrome: Secondary | ICD-10-CM | POA: Insufficient documentation

## 2018-07-15 DIAGNOSIS — R6884 Jaw pain: Secondary | ICD-10-CM | POA: Insufficient documentation

## 2018-07-15 DIAGNOSIS — M26622 Arthralgia of left temporomandibular joint: Secondary | ICD-10-CM | POA: Insufficient documentation

## 2018-07-15 DIAGNOSIS — G43109 Migraine with aura, not intractable, without status migrainosus: Secondary | ICD-10-CM | POA: Insufficient documentation

## 2018-07-15 DIAGNOSIS — K580 Irritable bowel syndrome with diarrhea: Secondary | ICD-10-CM | POA: Insufficient documentation

## 2018-07-16 ENCOUNTER — Ambulatory Visit (INDEPENDENT_AMBULATORY_CARE_PROVIDER_SITE_OTHER): Payer: 59 | Admitting: Family Medicine

## 2018-07-16 VITALS — BP 122/83 | HR 79 | Temp 98.4°F | Resp 16 | Ht 61.5 in | Wt 138.8 lb

## 2018-07-16 DIAGNOSIS — D508 Other iron deficiency anemias: Secondary | ICD-10-CM

## 2018-07-16 LAB — CBC
Absolute NRBC: 0 10*3/uL (ref 0.00–0.00)
Hematocrit: 41.1 % (ref 34.7–43.7)
Hgb: 13 g/dL (ref 11.4–14.8)
MCH: 30.2 pg (ref 25.1–33.5)
MCHC: 31.6 g/dL (ref 31.5–35.8)
MCV: 95.4 fL (ref 78.0–96.0)
MPV: 10 fL (ref 8.9–12.5)
Nucleated RBC: 0 /100 WBC (ref 0.0–0.0)
Platelets: 345 10*3/uL (ref 142–346)
RBC: 4.31 10*6/uL (ref 3.90–5.10)
RDW: 13 % (ref 11–15)
WBC: 7.6 10*3/uL (ref 3.10–9.50)

## 2018-07-16 LAB — HEMOLYSIS INDEX: Hemolysis Index: 4 (ref 0–18)

## 2018-07-16 LAB — FERRITIN: Ferritin: 46.78 ng/mL (ref 4.60–204.00)

## 2018-07-16 NOTE — Patient Instructions (Signed)
It was a pleasure meeting and taking care of you. Best of luck in [place].

## 2018-07-16 NOTE — Progress Notes (Signed)
Subjective:      Patient ID: Teresa [name] is a 47 y.o. female     Chief Complaint   Patient presents with   . Iron Deficiency     testing        HPI   She saw Dr. Mikki Harbor (EDS dr).     She needs iron tested. She is now doing iron 2 days on, 1 day off, and has switched brands. Wants to make sure still working.     Her disability paperwork has been submitted to her company.    She is moving tomorrow.     The following sections were reviewed this encounter by the provider:   Allergies  Meds  Problems         Review of Systems       BP 122/83 (BP Site: Left arm, Patient Position: Sitting)   Pulse 79   Temp 98.4 F (36.9 C) (Oral)   Resp 16   Ht 1.562 m (5' 1.5")   Wt 63 kg (138 lb 12.8 oz)   BMI 25.80 kg/m      Objective:     Physical Exam  Vitals signs and nursing note reviewed.   Constitutional:       Appearance: Normal appearance.   Neurological:      Mental Status: She is alert.   Psychiatric:         Mood and Affect: Mood normal.         Behavior: Behavior normal.         Assessment:     1. Iron deficiency anemia secondary to inadequate dietary iron intake  - CBC w/o diff  - Ferritin        Plan:     Patient Instructions   It was a pleasure meeting and taking care of you. Best of luck in [place].       [name], MD

## 2018-07-16 NOTE — Progress Notes (Signed)
Have you seen any specialists/other providers since your last visit with [place]?    Yes, Neurology, Eds specialist    Arm preference verified?   Yes    The patient is due for nothing at this time, HM is up-to-date.

## 2018-07-19 ENCOUNTER — Encounter (INDEPENDENT_AMBULATORY_CARE_PROVIDER_SITE_OTHER): Payer: Self-pay

## 2018-07-20 ENCOUNTER — Encounter (INDEPENDENT_AMBULATORY_CARE_PROVIDER_SITE_OTHER): Payer: Self-pay

## 2018-07-21 ENCOUNTER — Encounter (INDEPENDENT_AMBULATORY_CARE_PROVIDER_SITE_OTHER): Payer: Self-pay

## 2018-07-27 ENCOUNTER — Encounter (INDEPENDENT_AMBULATORY_CARE_PROVIDER_SITE_OTHER): Payer: Self-pay | Admitting: Neurology

## 2018-08-11 ENCOUNTER — Other Ambulatory Visit: Payer: Self-pay

## 2018-08-11 ENCOUNTER — Ambulatory Visit: Payer: 59 | Attending: Gastroenterology | Admitting: Physical Therapy

## 2018-08-11 ENCOUNTER — Encounter: Payer: Self-pay | Admitting: Physical Therapy

## 2018-08-11 DIAGNOSIS — R278 Other lack of coordination: Secondary | ICD-10-CM | POA: Diagnosis present

## 2018-08-11 DIAGNOSIS — M25562 Pain in left knee: Secondary | ICD-10-CM | POA: Insufficient documentation

## 2018-08-11 DIAGNOSIS — M542 Cervicalgia: Secondary | ICD-10-CM

## 2018-08-11 DIAGNOSIS — S43119S Subluxation of unspecified acromioclavicular joint, sequela: Secondary | ICD-10-CM | POA: Diagnosis present

## 2018-08-11 DIAGNOSIS — M25552 Pain in left hip: Secondary | ICD-10-CM | POA: Diagnosis present

## 2018-08-11 DIAGNOSIS — G8929 Other chronic pain: Secondary | ICD-10-CM

## 2018-08-11 DIAGNOSIS — M6289 Other specified disorders of muscle: Secondary | ICD-10-CM

## 2018-08-11 NOTE — Therapy (Addendum)
[place] MAIN [facility] SERVICES [address] [place], [place], [address] Phone: [phone]   Fax:  [phone]  Physical Therapy Evaluation  Patient Details  Name: Autumn Zhang MRN: [id number] Date of Birth: 1971-08-05 Referring Provider (PT): Candelaria Stagers    Encounter Date: 08/11/2018    Past Medical History:  Diagnosis Date  . Dysautonomia ([place])   . Ehlers-Danlos syndrome type III   . Idiopathic small fiber peripheral neuropathy   . 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 Migraines   . Neck pain   . Occipital neuralgia      Subjective Assessment - 08/18/18 1026    Subjective 1) pelvic floor issues: Pt had rectocele and chronic diarrhea ( Stool Type 7 -30-40%, 4-6 for 60-70%)  for 30 years. Pt has had colon cancer and underwent hemicolonectomy 2009.  Pt now has soft stools, occasional diarrhea, and frequent bowel movements few as 2, as many 7 per day. Pt has occasional fecal leakage but it is not as bad now. Stool Type 4 ( 50% while the 50% is Type 5-7)     Pt does not feel she empties all the way. Pt has not worked with a nutritionist yet and has a very limited food options. Pt has difficulty swallowing and has loose jaw joints on the L, and one muscle that does not work correctly. Pt has undergone speech therapy.  Pt loves to singing but she has not been singing because she is not going to church anymore.       2) EDS-related Sx: B shoulder pain :  shoulders sublux causing pain.  Triggers: moving arms up, sleeping on side. Through working with PT, pt has learned some strengthening exercises to help decrease pain, yet but she has not learned how to performing functional tasks except for opening door. 8/10 Sometimes radiating along "ulnar pain".  Sleeping 7-8 hours and is starting a CPAP.  Cortisone shots helped relieving in shoulders.      3) EDS-related Sx: L hip/knee pain without help from shots. In May, her L knee got injured when she was doing a Bone  Density Scan, her knee got turned in when a staff handled her leg for imaging.  Pt underwent PT for L knee. Pt noticed L hip pain as she walked differently due to the L knee pain. Pt can only walk 45 min. Hurts with rotating hip out and lifting thigh for stairs, sitting up straight 30 min.     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 This pain might relate to migraines. Loose left Jaw which impact chewing and swallowing and pulling her eye.  Undergone speech therapy which confirmed she has compensated her own.           Pertinent History  Pt is stressed by all of her medical conditons. One year ago, pt was able a carry a drum at the waist but she is not able anymore. Pt drummed 1.5 years but fatigue and pain limited her from continuing. Pt enjoyed hiking, dancing, and traveling but these are also limited by her conditions. 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  Mast cell activation and dysautonomia ( Dx 2019). POTS Symptoms for past 20 years but Sx are better as she has gotten older.  Patient Stated Goals 1) empty bowels completely   2) Shoulder goals: be able to type longer than 10 mins , put on clothes over head/ jackets, sweaters, putting arms up, clearing dishwasher from bottom rack    3) hip: walking > 45 min, rotating hip out and lifting thigh for stairs, sitting up straight 30 min.           [facility] PT Assessment - 08/18/18 1026      Assessment   Medical Diagnosis  pelvic floor, L hip     Referring Provider (PT)  Kubinski       Precautions   Precautions  --   not driving, weight lifting 8 lbs    Required Braces or Orthoses  Cervical Brace   when walking, ride in car, take shower      Balance Screen   Has the patient fallen in the past 6 months  No      Observation/Other Assessments    Observations  dowagers hump at C7/T1       Posture/Postural Control   Posture Comments  R sided pelvic floor more activated than L , diaphragmatic breathing       AROM   Overall AROM Comments  shoulder flexion 90 deg w/ cue to stabilize feet on the door, ( w/o cue: 80deg) with more difficulty in R glenohumeral joint due subluxation        Strength   Overall Strength Comments  Gross BLE 4/5  , R shoulder flex 3/5, L 4/5 , shoulder ext 4/5 B, elbo ext 4-/5 B w. shoulder pain, elbow flex 4/5 B,    R shoulder IR 4-/5 w/ pain in shoulder, L 4/5 , B ER 4/5       Palpation   Palpation comment  scar restriction above umbilicus       Bed Mobility   Bed Mobility  --   difficulty with log rolling due to limited shoulder               Objective measurements completed on examination: See above findings.      [place] Adult PT Treatment/Exercise - 08/18/18 1026      Neuro Re-ed    Neuro Re-ed Details   see pt instructions                  PT Long Term Goals - 08/17/18 0940      PT LONG TERM GOAL #1   Title  Pt will decrease her NDI score from % to < % in order to restore functional activities     Time  12    Period  Weeks    Status  New      PT LONG TERM GOAL #2   Title  Pt will decrease her LEFS  score from % to > % in order to restore functional activities     Time  12      PT LONG TERM GOAL #3   Title  Pt will decrease COREFO score from % to < % in order to restore GI function      Time  12      PT LONG TERM GOAL #4   Title  Pt will demo less forward head / dowager's hump, alignment of auditory meatus at acromium in order to minimize neck pain and provide scapular stability for reaching with arms     Time  8    Period  Weeks    Status  New  PT LONG TERM GOAL #5   Title  Pt will demo IND with stability exercises to minimze hypermobilty compensations 2/2 EDS     Time  4    Period  Weeks      Additional Long Term Goals   Additional Long Term Goals   Yes      PT LONG TERM GOAL #6   Title  Pt will demo proper pelvic floor coordination and lengthening in order to progress to deep core stabilization exercises and minimize straining with bowel movements     Time  4    Period  Weeks    Status  New             Plan - 08/18/18 1029    Clinical Impression Statement  Pt is a 47 yo female who reports pelvic floor issues that includes difficulty with complete emptying and frequent bowel movements. Hx of colon cancer with hemicolonectomy in 2009 and reports rectocele. Pt also reports EDS-related Sx that is related to B shoulder pain and subluxation,  L hip/knee pain and neck pain associated with migraines, and loose left jaw. These deficits impact her QOL and pt would like to return to hiking, dancing, and traveling. Clinical presentations include laxity of joints, significant forward head posture/ dowager's hump at C/T junction, shoulder weakness, deep core dyscoordination, tight pelvic floor mm, scar restriction over abdomen, and difficulty with weight bearing on upp kinetic chain with bed mobility. Pt reports sensitivity to manual Tx and thus, deferred today's Tx to guided neuromuscular reeducation for cervical /scapular restraction/depression in seated and gravity eliminated position. Pt reported no pain with HEP.         Pt is stressed by all of her medical conditons. One year ago, pt was able a carry a drum at the waist but she is not able anymore. Pt drummed 1.5 years but fatigue and pain limited her from continuing. Pt enjoyed hiking, dancing, and traveling but these are also limited by her conditions. 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  Mast cell activation and dysautonomia ( Dx 2019). POTS Symptoms for past 20 years but Sx are better as she has gotten older.        Clinical Presentation  Evolving     Clinical Decision Making  High    Rehab Potential  Good    PT Frequency  1x / week    PT Duration  12 weeks    PT Treatment/Interventions  Therapeutic activities;Neuromuscular re-education;Balance training;Energy conservation;Taping;Manual techniques;Patient/family education;Therapeutic exercise;Moist Heat;Electrical Stimulation;Scar mobilization    Consulted and Agree with Plan of Care  Patient       Patient will benefit from skilled therapeutic intervention in order to improve the following deficits and impairments:  Decreased scar mobility, Improper body mechanics, Increased edema, Impaired perceived functional ability, Decreased strength, Decreased safety awareness, Decreased coordination, Decreased balance, Decreased endurance, Decreased mobility, Hypomobility, Increased muscle spasms, Postural dysfunction, Increased fascial restricitons, Decreased activity tolerance  Visit Diagnosis: Other lack of coordination  Subluxation of acromioclavicular joint, unspecified laterality, sequela  Cervicalgia  Pain in left hip  Pelvic floor dysfunction     Problem List There are no active problems to display for this patient.   Jerl Mina ,PT, DPT, E-RYT  08/18/2018, 10:47 AM  [place] MAIN [facility] SERVICES [place], [place],  [name] Phone: [phone]   Fax:  [phone]  Name: Autumn Zhang MRN: [id number] Date of Birth: 12-Jun-1971

## 2018-08-11 NOTE — Patient Instructions (Signed)
Deep core level 1  (3 x day)   Shoulder and elbow press with exhalation with lengthened neck ( gentle)  10 reps x 3 x day

## 2018-08-18 ENCOUNTER — Ambulatory Visit: Payer: No Typology Code available for payment source | Attending: Gastroenterology | Admitting: Physical Therapy

## 2018-08-18 DIAGNOSIS — G8929 Other chronic pain: Secondary | ICD-10-CM | POA: Insufficient documentation

## 2018-08-18 DIAGNOSIS — S43119S Subluxation of unspecified acromioclavicular joint, sequela: Secondary | ICD-10-CM | POA: Insufficient documentation

## 2018-08-18 DIAGNOSIS — M6289 Other specified disorders of muscle: Secondary | ICD-10-CM | POA: Diagnosis present

## 2018-08-18 DIAGNOSIS — M542 Cervicalgia: Secondary | ICD-10-CM

## 2018-08-18 DIAGNOSIS — M25512 Pain in left shoulder: Secondary | ICD-10-CM | POA: Diagnosis not present

## 2018-08-18 DIAGNOSIS — R278 Other lack of coordination: Secondary | ICD-10-CM | POA: Diagnosis present

## 2018-08-18 DIAGNOSIS — M25552 Pain in left hip: Secondary | ICD-10-CM | POA: Diagnosis present

## 2018-08-18 DIAGNOSIS — M25562 Pain in left knee: Secondary | ICD-10-CM | POA: Diagnosis present

## 2018-08-18 NOTE — Patient Instructions (Addendum)
Discontinue: _the "bulging" exercises _abdominal  _discontinue anterior deltoid  _ crunch roll with floor transfer to avoid bearing down of pelvic floor and straining neck and spine   *do the neck exercises on bed  ____  Modify:  _External rotation with gentle j-scoop  _Mini squat (feet/knee slightly outward)  with ball:  Turn toes/knee out slightly and pushing down into 4 corners of feet on the rise  _Posterior deltoid: Mini squat (feet/knee slightly outward), shoulders down, J-scoop of the neck, then arms move   _10  with palms up   _10 with palm facing body   * all isometrics with exhalation and grounded ness through feet/ legs, soft knees, weightbearing across ballmound and heels , center of mass slightly along the ballmounds  ___  Add in:  "double kneeling with forearm push down"   To lower to floor:  Lunge ( stance is long enough, front knee is bent 90 deg above ankle, back toes tucked under ,   with both hands supported one on couch, other on stool by hips :  Transfer to the couch on double kneeling on pillow under knees for support  Use folder blanket for solid support under forearms   Upper arms relaxed by side ribs, "holdin pencil under armpit" , shoulders down and back, j-scoop happens  Elbow at 90 deg, fists, pibnbky side pressing down   Toes tucked, knees hip width apart, pressing down   ____

## 2018-08-18 NOTE — Therapy (Signed)
[place] MAIN [facility] SERVICES [address] [place], [place], [address] Phone: [phone]   Fax:  [phone]  Physical Therapy Treatment  Patient Details  Name: Autumn Zhang MRN: [id number] Date of Birth: [birth year] Referring Provider (PT): Candelaria Stagers    Encounter Date: 08/18/2018  PT End of Session - 08/18/18 1209    Visit Number  2    Number of Visits  12    PT Start Time  1110    PT Stop Time  1209    PT Time Calculation (min)  59 min    Activity Tolerance  Patient tolerated treatment well       Past Medical History:  Diagnosis Date  . Dysautonomia ([place])   . Ehlers-Danlos syndrome type III   . Idiopathic small fiber peripheral neuropathy   . 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 Migraines   . Neck pain   . Occipital neuralgia       Subjective Assessment - 08/18/18 1109    Subjective  Pt had occasional sparotic pain with the exercises but she was still able to perform them.       Pertinent History  Pt is stressed by all of her medical conditons. One year ago, pt was able a carry a drum at the waist but she is not able anymore. Pt drummed 1.5 years but fatigue and pain limited her from continuing. Pt enjoyed hiking, dancing, and traveling but these are also limited by her conditions. 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  Mast cell activation and dysautonomia ( Dx 2019). POTS Symptoms for past 20 years but Sx are better as she has gotten older.      Patient Stated Goals  1) empty bowels completely  2) Shoulder goals: be able to type longer than 10 mins , put on clothes over head/ jackets, sweaters, putting arms up, clearing dishwasher from bottom rack   3) hip: walking > 45 min, rotating hip out and lifting thigh for stairs, sitting up straight 30 min.           [facility] PT Assessment - 08/18/18 1127      Squat   Comments  poor forefoot co-activation, parallel feet and knee       Floor <> Stand t/f : BUE in front of body with lunge to half kneeling ( rounded shoulders)  Cued for foot alignment   Attempted log rolling wth cue for scap stabilization onto forearm but reported pain  ( discontinued this method immediately) Partial crunch with less pain but cued for point of contact with other arm            Pelvic Floor Special Questions - 08/18/18 1117    External Perineal Exam  assessing pt's prior Pelvic PT exercise "bulging'                      PT Long Term Goals - 08/17/18 0940      PT LONG TERM GOAL #1   Title  Pt will decrease her NDI score from % to < % in order to restore functional activities     Time  12    Period  Weeks    Status  New      PT LONG TERM GOAL #2   Title  Pt will decrease her LEFS  score from % to > % in order to restore functional activities     Time  12      PT LONG TERM GOAL #3   Title  Pt will decrease COREFO score from % to < % in order to restore GI function      Time  12      PT LONG TERM GOAL #4   Title  Pt will demo less forward head / dowager's hump, alignment of auditory meatus at acromium in order to minimize neck pain and provide scapular stability for reaching with arms     Time  8    Period  Weeks    Status  New      PT LONG TERM GOAL #5   Title  Pt will demo IND with stability exercises to minimze hypermobilty compensations 2/2 EDS     Time  4    Period  Weeks      Additional Long Term Goals   Additional Long Term Goals  Yes      PT LONG TERM GOAL #6   Title  Pt will demo proper pelvic floor coordination and lengthening in order to progress to deep core stabilization exercises and minimize straining with bowel movements     Time  4    Period  Weeks    Status  New            Plan - 08/18/18 1209    Clinical Impression Statement  Provided propioception cues for lower kinetic chain stabilization in her  previous shoulder PT exercises from prior rehab. Reviewed pt's body mechanics with bed mobility and floor to stand t/f to avoid bearing down/straining of neck/ spine and pelvic floor mm.  Pt will require more scapular retraction/depression to minimize subluxation in siderolling. Pt is performing partial crunch with compensations for now until more strengthening and achievement of shoulder flexion overhead can be achieved for proper log rolling.  Discontinued some of her previous PT exercises to minimzie bearing down of pelvic floor which will help her rectocele and bowel elimination.  Added shoulder depression/retraction with forearm contact in double kneeling for strength. Stand to floor t/f was safe with lunge to half kneeling with BUE on support on couch/ stool.  Pt demo'd improved cervical retractionand scapular stabilization with less cues by the end of session. Pt continues to benefit from skilled PT.      Rehab Potential  Good    PT Frequency  1x / week    PT Duration  12 weeks    PT Treatment/Interventions  Therapeutic activities;Neuromuscular re-education;Balance training;Energy conservation;Taping;Manual techniques;Patient/family education;Therapeutic exercise;Moist Heat;Electrical Stimulation;Scar mobilization    Consulted and Agree with Plan of Care  Patient       Patient will benefit from skilled therapeutic intervention in order to improve the following deficits and impairments:  Decreased scar mobility, Improper body mechanics, Increased edema, Impaired perceived functional ability, Decreased strength, Decreased safety awareness, Decreased coordination, Decreased balance, Decreased endurance, Decreased mobility, Hypomobility, Increased muscle spasms, Postural dysfunction, Increased fascial restricitons, Decreased activity tolerance  Visit Diagnosis: Chronic pain of left knee     Problem List There are no active problems to display for this patient.   Jerl Mina ,PT, DPT,  E-RYT  08/18/2018, 12:16 PM  [place] MAIN [facility] SERVICES [address] [place], [place], [address] Phone: [phone]   Fax:  [phone]  Name: Autumn Zhang MRN: [id number] Date of Birth: 25-Apr-1971

## 2018-08-18 NOTE — Addendum Note (Signed)
Addended by: Jerl Mina on: 08/18/2018 11:04 AM   Modules accepted: Orders

## 2018-08-24 ENCOUNTER — Ambulatory Visit: Payer: No Typology Code available for payment source | Admitting: Physical Therapy

## 2018-08-24 DIAGNOSIS — S43119S Subluxation of unspecified acromioclavicular joint, sequela: Secondary | ICD-10-CM

## 2018-08-24 DIAGNOSIS — M25562 Pain in left knee: Secondary | ICD-10-CM

## 2018-08-24 DIAGNOSIS — M6289 Other specified disorders of muscle: Secondary | ICD-10-CM

## 2018-08-24 DIAGNOSIS — M25552 Pain in left hip: Secondary | ICD-10-CM

## 2018-08-24 DIAGNOSIS — G8929 Other chronic pain: Secondary | ICD-10-CM

## 2018-08-24 DIAGNOSIS — M542 Cervicalgia: Secondary | ICD-10-CM

## 2018-08-24 DIAGNOSIS — R278 Other lack of coordination: Secondary | ICD-10-CM

## 2018-08-24 NOTE — Therapy (Signed)
[place] MAIN [facility] SERVICES [address] [place], [place], [address] Phone: [phone]   Fax:  [phone]  Physical Therapy Treatment  Patient Details  Name: Autumn Zhang MRN: [id number] Date of Birth: March 02, 1971 Referring Provider (PT): Candelaria Stagers    Encounter Date: 08/24/2018  PT End of Session - 08/24/18 1024    Visit Number  3    Number of Visits  12    PT Start Time  0910    PT Stop Time  1015    PT Time Calculation (min)  65 min    Activity Tolerance  Patient tolerated treatment well       Past Medical History:  Diagnosis Date  . Dysautonomia ([place])   . Ehlers-Danlos syndrome type III   . Idiopathic small fiber peripheral neuropathy   . 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 Migraines   . Neck pain   . Occipital neuralgia       Subjective Assessment - 08/24/18 0910    Subjective  Pt is still struggling out of bed. Pt would like to learn how to sleep . She currently uses lots of towels/ pillows to find comfort. Pt has not figured out how to not engage in her neck when moving.      Pertinent History  Pt is stressed by all of her medical conditons. One year ago, pt was able a carry a drum at the waist but she is not able anymore. Pt drummed 1.5 years but fatigue and pain limited her from continuing. Pt enjoyed hiking, dancing, and traveling but these are also limited by her conditions. 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  Mast cell activation and dysautonomia ( Dx 2019). POTS Symptoms for past 20 years but Sx are better as she has gotten older.      Patient Stated Goals  1) empty bowels completely  2) Shoulder goals: be able to type longer than 10 mins , put on clothes over head/ jackets, sweaters, putting arms up, clearing dishwasher from bottom rack   3) hip: walking > 45 min, rotating hip out and lifting  thigh for stairs, sitting up straight 30 min.           [facility] PT Assessment - 08/24/18 0913      Observation/Other Assessments   Observations  L hip pain with propping foot on 7" squatty potty.   Standing: hip flexion/ext on B caused L hip pain     less dowager hump,  improved scapular depression/retraction     Coordination   Gross Motor Movements are Fluid and Coordinated  --   excessive cues for contact at feet,hips,back w/deep core ex     Bed Mobility   Sit to Supine  --   poor alignment of elbow under shoulder, pain under R shoulde                  [facility] Adult PT Treatment/Exercise - 08/24/18 0913      Therapeutic Activites    Therapeutic Activities  --   see pt instructions      Neuro Re-ed    Neuro Re-ed Details   see pt instructions                  PT Long Term Goals - 08/24/18 1355      PT  LONG TERM GOAL #1   Title  Pt will decrease her NDI score from % to < % in order to restore functional activities     Time  12    Period  Weeks    Status  On-going      PT LONG TERM GOAL #2   Title  Pt will increase her score on PSFS score with walking > 45 min 5/10 , sit on hard surface, upright 5/10, rotate hip 2/10 to > 8/10 in order to perform ADLs    Time  12    Period  Weeks    Status  On-going      PT LONG TERM GOAL #3   Title  Pt will decrease COREFO score from 34% to <24 % in order to restore GI function      Time  12    Status  On-going      PT LONG TERM GOAL #4   Title  Pt will demo less forward head / dowager's hump, alignment of auditory meatus at acromium in order to minimize neck pain and provide scapular stability for reaching with arms     Time  8    Period  Weeks    Status  On-going      PT LONG TERM GOAL #5   Title  Pt will demo IND with stability exercises to minimze hypermobilty compensations 2/2 EDS     Time  4    Period  Weeks    Status  On-going      PT LONG TERM GOAL #6   Title  Pt will demo proper pelvic floor  coordination and lengthening in order to progress to deep core stabilization exercises and minimize straining with bowel movements     Time  4    Period  Weeks    Status  On-going            Plan - 08/24/18 1024    Clinical Impression Statement Pt showed improved scapular control with good carry over from last session. Focused on body mechanics with bed mobility to minimize shoulder pain/ risk for worsening R labral tear today.  Provided education on principle of stability and use pillow during bed mobility sit> sidelying for proper alignment of upper kinetic chain in closed chain position.   Also focused on toileting mechanics with proper breathing technique to minimize straining. Pt was not able to place feet on 7" squatty potty due to L hip pain.  Plan to perform pelvic assessment at next session but will have to perform rectal exam in hooklying as pt is unable to remain in sidelying position due to shoulder pain. Pt continues to benefit from skilled.     Rehab Potential  Good    PT Frequency  1x / week    PT Duration  12 weeks    PT Treatment/Interventions  Therapeutic activities;Neuromuscular re-education;Balance training;Energy conservation;Taping;Manual techniques;Patient/family education;Therapeutic exercise;Moist Heat;Electrical Stimulation;Scar mobilization    Consulted and Agree with Plan of Care  Patient       Patient will benefit from skilled therapeutic intervention in order to improve the following deficits and impairments:  Decreased scar mobility, Improper body mechanics, Increased edema, Impaired perceived functional ability, Decreased strength, Decreased safety awareness, Decreased coordination, Decreased balance, Decreased endurance, Decreased mobility, Hypomobility, Increased muscle spasms, Postural dysfunction, Increased fascial restricitons, Decreased activity tolerance  Visit Diagnosis: Other lack of coordination  Chronic pain of left knee  Subluxation of  acromioclavicular joint, unspecified laterality, sequela  Cervicalgia  Pain in left hip  Pelvic floor dysfunction     Problem List There are no active problems to display for this patient.   Jerl Mina ,PT, DPT, E-RYT  08/24/2018, 1:57 PM  [place] MAIN [facility] SERVICES [address] [place], [place], [address] Phone: [phone]   Fax:  [phone]  Name: Autumn Zhang MRN: [id number] Date of Birth: 1971-05-29

## 2018-08-24 NOTE — Patient Instructions (Signed)
Deep core level 1 and 2 handout   Apply deep core level 1 with toileting. Do not use squatty potty due to L hip pain   Getting into bed with pillow under armpit to make sure elbow is slightly ahead of shoulder socket  Get into bed on L side to minimize worsening of R shoulder   Record a video of your getting into bed routine

## 2018-08-27 ENCOUNTER — Encounter (INDEPENDENT_AMBULATORY_CARE_PROVIDER_SITE_OTHER): Payer: Self-pay | Admitting: Neurology

## 2018-08-29 ENCOUNTER — Encounter (INDEPENDENT_AMBULATORY_CARE_PROVIDER_SITE_OTHER): Payer: Self-pay

## 2018-08-31 ENCOUNTER — Encounter (HOSPITAL_BASED_OUTPATIENT_CLINIC_OR_DEPARTMENT_OTHER): Payer: Self-pay

## 2018-09-01 ENCOUNTER — Ambulatory Visit: Payer: No Typology Code available for payment source | Admitting: Physical Therapy

## 2018-09-01 DIAGNOSIS — S43119S Subluxation of unspecified acromioclavicular joint, sequela: Secondary | ICD-10-CM

## 2018-09-01 DIAGNOSIS — M6289 Other specified disorders of muscle: Secondary | ICD-10-CM

## 2018-09-01 DIAGNOSIS — M25552 Pain in left hip: Secondary | ICD-10-CM

## 2018-09-01 DIAGNOSIS — G8929 Other chronic pain: Secondary | ICD-10-CM

## 2018-09-01 DIAGNOSIS — M542 Cervicalgia: Secondary | ICD-10-CM

## 2018-09-01 DIAGNOSIS — R278 Other lack of coordination: Secondary | ICD-10-CM

## 2018-09-01 DIAGNOSIS — M25562 Pain in left knee: Secondary | ICD-10-CM

## 2018-09-01 NOTE — Patient Instructions (Addendum)
Bed transfer: looks good on the Left elbow but dig heels of both feet into bed to put more weight there instead of upper body when lower / rising trunk   Then add a moment of pause before lowering " [place]" exericse ( you named it)  5 breaths with 4 corners down, knees bent to comfort, knees / feet are aligned, fingers point back, wrist lifts "tenting" pressing down   _____   Avoid adding quick squeeze at end of exhale, keep relaxed and notice gentle lift like telescope with breathing and with toileting   _____  Standing posture without over arching back: think dinosaur tail from nape of neck and tailbone, place thumbs at ribs, index finger at ASIS "like suspenders " to minimize flaring out of ribs

## 2018-09-02 ENCOUNTER — Inpatient Hospital Stay: Payer: No Typology Code available for payment source

## 2018-09-02 ENCOUNTER — Encounter: Payer: Self-pay | Admitting: Oncology

## 2018-09-02 ENCOUNTER — Other Ambulatory Visit: Payer: Self-pay

## 2018-09-02 ENCOUNTER — Inpatient Hospital Stay: Payer: No Typology Code available for payment source | Attending: Oncology | Admitting: Oncology

## 2018-09-02 VITALS — BP 118/79 | HR 92 | Temp 98.7°F | Resp 18 | Wt 135.4 lb

## 2018-09-02 DIAGNOSIS — Z85038 Personal history of other malignant neoplasm of large intestine: Secondary | ICD-10-CM | POA: Insufficient documentation

## 2018-09-02 DIAGNOSIS — Z1509 Genetic susceptibility to other malignant neoplasm: Secondary | ICD-10-CM

## 2018-09-02 DIAGNOSIS — D509 Iron deficiency anemia, unspecified: Secondary | ICD-10-CM | POA: Diagnosis not present

## 2018-09-02 DIAGNOSIS — C189 Malignant neoplasm of colon, unspecified: Secondary | ICD-10-CM

## 2018-09-02 LAB — VITAMIN B12: Vitamin B-12: 601 pg/mL (ref 180–914)

## 2018-09-02 LAB — IRON AND TIBC
Iron: 66 ug/dL (ref 28–170)
Saturation Ratios: 16 % (ref 10.4–31.8)
TIBC: 402 ug/dL (ref 250–450)
UIBC: 336 ug/dL

## 2018-09-02 LAB — URINALYSIS, COMPLETE (UACMP) WITH MICROSCOPIC
BACTERIA UA: NONE SEEN
Bilirubin Urine: NEGATIVE
Glucose, UA: NEGATIVE mg/dL
Hgb urine dipstick: NEGATIVE
Ketones, ur: 5 mg/dL — AB
Leukocytes, UA: NEGATIVE
Nitrite: NEGATIVE
PH: 7 (ref 5.0–8.0)
Protein, ur: NEGATIVE mg/dL
Specific Gravity, Urine: 1.006 (ref 1.005–1.030)

## 2018-09-02 LAB — CBC WITH DIFFERENTIAL/PLATELET
Abs Immature Granulocytes: 0.02 10*3/uL (ref 0.00–0.07)
Basophils Absolute: 0.1 10*3/uL (ref 0.0–0.1)
Basophils Relative: 1 %
Eosinophils Absolute: 0.1 10*3/uL (ref 0.0–0.5)
Eosinophils Relative: 1 %
HCT: 43.2 % (ref 36.0–46.0)
Hemoglobin: 13.8 g/dL (ref 12.0–15.0)
Immature Granulocytes: 0 %
LYMPHS ABS: 1.7 10*3/uL (ref 0.7–4.0)
Lymphocytes Relative: 20 %
MCH: 29.1 pg (ref 26.0–34.0)
MCHC: 31.9 g/dL (ref 30.0–36.0)
MCV: 91.1 fL (ref 80.0–100.0)
Monocytes Absolute: 0.6 10*3/uL (ref 0.1–1.0)
Monocytes Relative: 7 %
NRBC: 0 % (ref 0.0–0.2)
Neutro Abs: 5.8 10*3/uL (ref 1.7–7.7)
Neutrophils Relative %: 71 %
Platelets: 370 10*3/uL (ref 150–400)
RBC: 4.74 MIL/uL (ref 3.87–5.11)
RDW: 12 % (ref 11.5–15.5)
WBC: 8.2 10*3/uL (ref 4.0–10.5)

## 2018-09-02 LAB — FERRITIN: Ferritin: 74 ng/mL (ref 11–307)

## 2018-09-02 LAB — FOLATE: Folate: 25 ng/mL (ref >5.9)

## 2018-09-02 NOTE — Progress Notes (Signed)
Met with Ms. Haithcock. Introduced nurse navigator services and provided my contact information for any barriers/colon cancer needs. 

## 2018-09-02 NOTE — Progress Notes (Signed)
Chart reviewed:   47 year old female with a history of T3N0M0 colon cancer and a strong family history of colon cancer and basal cell cancer presents for follow up. She was diagnosed with colon cancer in 2009 in [place]. 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 She had 0/34 positive lymph nodes. She received 12 cycles of adjuvant chemotherapy with 5-FU/Leucovorin.  Her microsatellite IHC was normal. Her father died of colon cancer at 49, and her only brother had polyps. She had an [facility] colon cancer panel that was reportedly normal other than a POLB Leu259Ser.  Her microsatellite IHC was normal. Her father died of colon cancer at 49, and her only brother had polyps. She had an [facility] colon cancer panel that was reportedly normal other than a POLB Leu259Ser.  Plan per previous oncology group:  Given the very strong family history and her early presentation, continue Lynch-like screening. -CEA, CA 125 yearly. (last normal in December 2018) -Colonoscopy Q2 years. She had a normal colonoscopy in 2018. Her next will be in the spring of 2020. -Pelvic U/S and GYN exam yearly. This will be done next week (Dr. Anderson group). -Qyear follow up  03/04/2018  CEA 2.1 CA 125 12.1  She is seeing Dr. Rao today to establish care in this area. Will address any barriers.needs that she may have.      

## 2018-09-02 NOTE — Therapy (Signed)
[place] MAIN [facility] SERVICES [address] [place], [place], [address] Phone: [phone]   Fax:  [phone]  Physical Therapy Treatment  Patient Details  Name: Autumn Zhang MRN: [id number] Date of Birth: 1970-10-07 Referring Provider (PT): Candelaria Stagers    Encounter Date: 09/01/2018    Past Medical History:  Diagnosis Date  . Colon cancer ([place])   . Dysautonomia ([place])   . Ehlers-Danlos syndrome type III   . Idiopathic small fiber peripheral neuropathy   . 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 Migraines   . Neck pain   . Occipital neuralgia     No past surgical history on file.  There were no vitals filed for this visit.  Subjective Assessment - 09/02/18 2058    Subjective  Pt reported she was not able to use the pillow in the technique from last session due to not having space on her small bed. Pt has used a modified technique on her L arm and it seems to be okay.  Last week, pt had a lot pain during the session and after session ( R shoulder baseline 6/10 --> post session 8/10,  left shoulder 5/10  _-> post session 6/10 ,  L hip baseline 1/10 --> post Tx and for several days 3-4/10 but spiked at a 6/10,).  With toileting, pt found one bit of progress she noticed last week, when she used the inhalation for bowel movements, it was a little more comfortable and she felt "her head was not going to explode."  Pt felt the muscles did not go back up with breathing.       Pertinent History  Pt is stressed by all of her medical conditons. One year ago, pt was able a carry a drum at the waist but she is not able anymore. Pt drummed 1.5 years but fatigue and pain limited her from continuing. Pt enjoyed hiking, dancing, and traveling but these are also limited by her conditions. 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  Mast cell activation  and dysautonomia ( Dx 2019). POTS Symptoms for past 20 years but Sx are better as she has gotten older.      Patient Stated Goals  1) empty bowels completely  2) Shoulder goals: be able to type longer than 10 mins , put on clothes over head/ jackets, sweaters, putting arms up, clearing dishwasher from bottom rack   3) hip: walking > 45 min, rotating hip out and lifting thigh for stairs, sitting up straight 30 min.           [facility] PT Assessment - 09/02/18 2057      Other:   Other/ Comments  bed mobility: proper alignment with Lshoulder and elbow . pt reported 3/10 pain at L shoulder but goes away quickly       Posture/Postural Control   Posture Comments  less forward head, minor cues for cervical and scapular retraction / depression in standing,   cued for decreasing lumbar lordosis with tactile self palpation and visualization                  Pelvic Floor Special Questions - 09/02/18 2057    Pelvic Floor Internal Exam  Pt consented to exam without contraindications     Exam Type  Rectal    Palpation  reflexive contraction on exhalation,  EAS, L puborectalist  with tightness , proper lengthening with inhalation     Strength  fair squeeze, definite lift        [facility] Adult PT Treatment/Exercise - 09/02/18 2057      Therapeutic Activites    Therapeutic Activities  --   reviewed bed mobilty w/ proper shoulder/elbow alignment     Neuro Re-ed    Neuro Re-ed Details   see pt instructions      Manual Therapy   Internal Pelvic Floor  L puborectalis release                   PT Long Term Goals - 08/24/18 1355      PT LONG TERM GOAL #1   Title  Pt will decrease her NDI score from % to < % in order to restore functional activities     Time  12    Period  Weeks    Status  On-going      PT LONG TERM GOAL #2   Title  Pt will increase her score on PSFS score with walking > 45 min 5/10 , sit on hard surface, upright 5/10, rotate hip 2/10 to > 8/10 in order to perform  ADLs    Time  12    Period  Weeks    Status  On-going      PT LONG TERM GOAL #3   Title  Pt will decrease COREFO score from 34% to <24 % in order to restore GI function      Time  12    Status  On-going      PT LONG TERM GOAL #4   Title  Pt will demo less forward head / dowager's hump, alignment of auditory meatus at acromium in order to minimize neck pain and provide scapular stability for reaching with arms     Time  8    Period  Weeks    Status  On-going      PT LONG TERM GOAL #5   Title  Pt will demo IND with stability exercises to minimze hypermobilty compensations 2/2 EDS     Time  4    Period  Weeks    Status  On-going      PT LONG TERM GOAL #6   Title  Pt will demo proper pelvic floor coordination and lengthening in order to progress to deep core stabilization exercises and minimize straining with bowel movements     Time  4    Period  Weeks    Status  On-going            Plan - 09/02/18 2104    Clinical Impression Statement  Pt demo'd good carry over with cervical retraction/scapular stabilization from previous sessions. Pt' demo'd proper upper kinetic chain alignment in bed mobility modification which will help pt minimize worsening of R shoulder labral tear and injuries to L shoulder.  Today, addressed pt's difficulty with emptying bowels. Internal rectal assessment showed dyscoordination and L puborectalis mm tightness. Pt demo'd technique for lengthening pelvic floor correctly post Tx alnd also demo'd decreased pelvic floor tightness following Tx.  Pt  reports no increased pain during Tx and after and pt notices she is more relaxed.  Pt continues to benefit from skilled PT.    Rehab Potential  Good    PT Frequency  1x / week    PT Duration  12 weeks    PT Treatment/Interventions  Therapeutic activities;Neuromuscular re-education;Balance training;Energy conservation;Taping;Manual techniques;Patient/family  education;Therapeutic exercise;Moist Heat;Electrical  Stimulation;Scar mobilization    Consulted and Agree with Plan of Care  Patient       Patient will benefit from skilled therapeutic intervention in order to improve the following deficits and impairments:  Decreased scar mobility, Improper body mechanics, Increased edema, Impaired perceived functional ability, Decreased strength, Decreased safety awareness, Decreased coordination, Decreased balance, Decreased endurance, Decreased mobility, Hypomobility, Increased muscle spasms, Postural dysfunction, Increased fascial restricitons, Decreased activity tolerance  Visit Diagnosis: Other lack of coordination  Chronic pain of left knee  Subluxation of acromioclavicular joint, unspecified laterality, sequela  Cervicalgia  Pain in left hip  Pelvic floor dysfunction     Problem List There are no active problems to display for this patient.   Jerl Mina 09/02/2018, [place] MAIN [facility] SERVICES [address] [place], [place], [address] Phone: [phone]   Fax:  [phone]  Name: Nilaya Bouie MRN: [id number] Date of Birth: 1970/10/27

## 2018-09-02 NOTE — Progress Notes (Signed)
New patient with history of colon cancer in 2008,  in to establish care since relocating to area from [place].

## 2018-09-03 ENCOUNTER — Telehealth: Payer: Self-pay

## 2018-09-03 ENCOUNTER — Encounter: Payer: Self-pay | Admitting: Oncology

## 2018-09-03 ENCOUNTER — Other Ambulatory Visit: Payer: Self-pay | Admitting: Maternal Newborn

## 2018-09-03 ENCOUNTER — Telehealth: Payer: Self-pay | Admitting: Oncology

## 2018-09-03 DIAGNOSIS — Z85038 Personal history of other malignant neoplasm of large intestine: Secondary | ICD-10-CM

## 2018-09-03 NOTE — Telephone Encounter (Signed)
faxed over New patient referral to [facility]. The patient is new in the area and need to establish care. Per Dr Janese Banks the patient need a pelvis exam and pelvis usg given her genetic predispoistion to colon cancer and being sceened as Lynch syndrome although she doe not have it. Per Dr Janese Banks the patient Iron,B-12,Folate levels are normal and she is not a anemic. The patient does need IV Iron at this time. I have also sent a message to Verdis Frederickson to schedule the patient for 2 doses of feraheme. The patient was understanding and agreeable to continue with treatment.

## 2018-09-03 NOTE — Progress Notes (Signed)
Hematology/Oncology Consult note [facility] Telephone:([phone] [phone]  Patient Care Team: Herma Carson, MD as PCP - General (Internal Medicine) Clent Jacks, RN as Registered Nurse   Name of the patient: Autumn Zhang  [id number]  1971-02-09    Reason for referral-history of colon cancer in 2009   Referring physician-Dr. 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  She underwent right hemicolectomy which revealed poorly differentiated invasive adenocarcinoma T3N0.  0 out of 34 lymph nodes were positive.  She received 12 cycles of adjuvant chemotherapy with 5-FU leucovorin.  No oxaliplatin.  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 her father died of colon cancer at the age of 35.  Her only brother has also had colon polyps.  She has therefore been managed as a Lynch syndrome patient despite not having genetic features suggestive of Lynch syndrome.  She took prophylactic aspirin for a while and then she stopped taking it.  She has been getting colonoscopies every 1 to 2 years.  She has also had capsule endoscopy in 2014 for iron deficiency anemia that was normal.  She has not required any surveillance scans as she has been more than 5 years since her diagnosis of colon cancer  Other than that patient has a history of basal cell skin cancer x2 and is in the process of finding a dermatologist.  She was also getting yearly pelvic exams and pelvic ultrasound to screen for GYN cancers.  Patient has a history of mast cell activation syndrome and often needs Benadryl before taking oral iron.  Also reports that she has dysautonomia and pots for which she sees cardiology.  Currently patient has an ongoing migraine attack.  She is wearing dark glasses and a cat and unable to see  eye to eye.  She denies any blood in her stool or urine presently.  Denies any dark melanotic stools.  Her last IV iron was about 6 months ago and she does not remember what type of IV iron she has received.  Her appetite is normal and she has not had any unintentional weight loss.  ECOG PS- 1  Pain scale- 8-migraine headaches   Review of systems- Review of Systems  Constitutional: Negative for chills, fever, malaise/fatigue and weight loss.  HENT: Negative for congestion, ear discharge and nosebleeds.   Eyes: Negative for blurred vision.  Respiratory: Negative for cough, hemoptysis, sputum production, shortness of breath and wheezing.   Cardiovascular: Negative for chest pain, palpitations, orthopnea and claudication.  Gastrointestinal: Negative for abdominal pain, blood in stool, constipation, diarrhea, heartburn, melena, nausea and vomiting.  Genitourinary: Negative for dysuria, flank pain, frequency, hematuria and urgency.  Musculoskeletal: Negative for back pain, joint pain and myalgias.  Skin: Negative for rash.  Neurological: Positive for headaches. Negative for dizziness, tingling, focal weakness, seizures and weakness.  Endo/Heme/Allergies: Does not bruise/bleed easily.  Psychiatric/Behavioral: Negative for depression and suicidal ideas. The patient does not have insomnia.     Allergies  Allergen Reactions  . Bupropion   . Celebrex [Celecoxib]   . Codeine   . Dexilant [Dexlansoprazole]   . Dulera [Mometasone Furo-Formoterol Fum]   . Fentanyl   . Fluticasone   . Gabapentin   . Gluten Meal   . Iodine   . Lidocaine   . Lyrica [Pregabalin]   .  Mold Extract [Trichophyton]   . Morphine And Related   . Novocain [Procaine]   . Penicillins     Allergic to Fluoroquinolone antibiotics!  . Pollen Extract   . Prilosec [Omeprazole]   . Silenor [Doxepin Hcl]   . Tomato   . Wheat Bran     There are no active problems to display for this patient.    Past Medical History:    Diagnosis Date  . Colon cancer ([place])   . Dysautonomia ([place])   . Ehlers-Danlos syndrome type III   . Idiopathic small fiber peripheral neuropathy   . Mast cell activation ([place])   . Migraines   . Neck pain   . Occipital neuralgia      History reviewed. No pertinent surgical history.  Social History   Socioeconomic History  . Marital status: Married    Spouse name: Not on file  . Number of children: Not on file  . Years of education: Not on file  . Highest education level: Not on file  Occupational History  . Not on file  Social Needs  . Financial resource strain: Not on file  . Food insecurity:    Worry: Not on file    Inability: Not on file  . Transportation needs:    Medical: Not on file    Non-medical: Not on file  Tobacco Use  . Smoking status: Never Smoker  . Smokeless tobacco: Never Used  Substance and Sexual Activity  . Alcohol use: Never    Frequency: Never  . Drug use: Never  . Sexual activity: Not Currently  Lifestyle  . Physical activity:    Days per week: Not on file    Minutes per session: Not on file  . Stress: Not on file  Relationships  . Social connections:    Talks on phone: Not on file    Gets together: Not on file    Attends religious service: Not on file    Active member of club or organization: Not on file    Attends meetings of clubs or organizations: Not on file    Relationship status: Not on file  . Intimate partner violence:    Fear of current or ex partner: Not on file    Emotionally abused: Not on file    Physically abused: Not on file    Forced sexual activity: Not on file  Other Topics Concern  . Not on file  Social History Narrative  . Not on file     Family History  Problem Relation Age of Onset  . Cancer Father   . Colon cancer Father   . Breast cancer Maternal Aunt   . Colon cancer Maternal Grandmother   . Leukemia Maternal Grandfather      Current Outpatient Medications:  .  acetaminophen (TYLENOL) 500 MG  tablet, Take 500 mg by mouth every 6 (six) hours as needed., Disp: , Rfl:  .  albuterol (ACCUNEB) 0.63 MG/3ML nebulizer solution, Take 1 ampule by nebulization every 6 (six) hours as needed for wheezing., Disp: , Rfl:  .  amitriptyline (ELAVIL) 10 MG tablet, Take 10 mg by mouth at bedtime., Disp: , Rfl:  .  Ascorbic Acid (VITAMIN C) 1000 MG tablet, Take 1,000 mg by mouth daily., Disp: , Rfl:  .  augmented betamethasone dipropionate (DIPROLENE-AF) 0.05 % ointment, Apply topically 2 (two) times daily., Disp: , Rfl:  .  Boswellia Serrata (BOSWELLIA PO), Take by mouth., Disp: , Rfl:  .  CALCIUM CITRATE PO, Take  by mouth., Disp: , Rfl:  .  carboxymethylcellulose (REFRESH PLUS) 0.5 % SOLN, 1 drop 3 (three) times daily as needed., Disp: , Rfl:  .  cromolyn (GASTROCROM) 100 MG/5ML solution, Take by mouth 4 (four) times daily -  before meals and at bedtime., Disp: , Rfl:  .  cycloSPORINE (RESTASIS) 0.05 % ophthalmic emulsion, 1 drop 2 (two) times daily., Disp: , Rfl:  .  diphenhydrAMINE (BENADRYL) 12.5 MG/5ML liquid, Take by mouth 4 (four) times daily as needed., Disp: , Rfl:  .  estradiol (ESTRACE) 0.1 MG/GM vaginal cream, Place 1 Applicatorful vaginally at bedtime., Disp: , Rfl:  .  famotidine (PEPCID) 10 MG tablet, Take 10 mg by mouth 2 (two) times daily., Disp: , Rfl:  .  ferrous sulfate 325 (65 FE) MG tablet, Take 325 mg by mouth daily with breakfast., Disp: , Rfl:  .  fexofenadine (ALLEGRA) 180 MG tablet, Take 180 mg by mouth daily., Disp: , Rfl:  .  guaifenesin (HUMIBID E) 400 MG TABS tablet, Take 400 mg by mouth every 4 (four) hours., Disp: , Rfl:  .  hydrOXYzine (ATARAX/VISTARIL) 50 MG tablet, Take 50 mg by mouth 3 (three) times daily as needed., Disp: , Rfl:  .  L-Lysine 500 MG CAPS, Take by mouth 2 (two) times daily., Disp: , Rfl:  .  L-THEANINE PO, Take by mouth., Disp: , Rfl:  .  loperamide (IMODIUM) 2 MG capsule, Take by mouth as needed for diarrhea or loose stools., Disp: , Rfl:  .   magnesium oxide (MAG-OX) 400 MG tablet, Take 400 mg by mouth daily., Disp: , Rfl:  .  MELATONIN SL, Place under the tongue., Disp: , Rfl:  .  montelukast (SINGULAIR) 10 MG tablet, Take 10 mg by mouth at bedtime., Disp: , Rfl:  .  polyethylene glycol (MIRALAX / GLYCOLAX) packet, Take 17 g by mouth daily., Disp: , Rfl:  .  pseudoephedrine (SUDAFED) 30 MG tablet, Take 30 mg by mouth every 4 (four) hours as needed for congestion., Disp: , Rfl:  .  psyllium (METAMUCIL) 58.6 % packet, Take 1 packet by mouth daily., Disp: , Rfl:  .  pyridostigmine (MESTINON) 60 MG/5ML solution, Take 60 mg by mouth 3 (three) times daily., Disp: , Rfl:  .  simethicone (MYLICON) 80 MG chewable tablet, Chew 80 mg by mouth every 6 (six) hours as needed for flatulence., Disp: , Rfl:  .  Sodium Fluoride (PREVIDENT 5000 BOOSTER) 1.1 % PSTE, Place onto teeth., Disp: , Rfl:  .  TRIAMCINOLONE ACETONIDE,NASAL, NA, Place into the nose., Disp: , Rfl:  .  vitamin k 100 MCG tablet, Take 100 mcg by mouth 4 (four) times daily., Disp: , Rfl:    Physical exam:  Vitals:   09/02/18 1057  BP: 118/79  Pulse: 92  Resp: 18  Temp: 98.7 F (37.1 C)  TempSrc: Tympanic  Weight: 135 lb 6 oz (61.4 kg)   Physical Exam Constitutional:      Comments: Patient appears in distress due to her ongoing migraine headaches.  She is wearing dark glasses and has a strap around her face and is wearing a cap.  Unable to see me eye to eye due to her migraine  HENT:     Head: Normocephalic and atraumatic.  Eyes:     Pupils: Pupils are equal, round, and reactive to light.  Neck:     Musculoskeletal: Normal range of motion.  Cardiovascular:     Rate and Rhythm: Normal rate and regular rhythm.  Heart sounds: Normal heart sounds.  Pulmonary:     Effort: Pulmonary effort is normal.     Breath sounds: Normal breath sounds.  Abdominal:     General: Bowel sounds are normal.     Palpations: Abdomen is soft.  Skin:    General: Skin is warm and dry.    Neurological:     Mental Status: She is oriented to person, place, and time.        No flowsheet data found. CBC Latest Ref Rng & Units 09/02/2018  WBC 4.0 - 10.5 K/uL 8.2  Hemoglobin 12.0 - 15.0 g/dL 13.8  Hematocrit 36.0 - 46.0 % 43.2  Platelets 150 - 400 K/uL 370     Assessment and plan- Patient is a 47 y.o. female with following issues:  1.  Stage II colon cancer in 2009: It has been more than 5 years since the diagnosis of her colon cancer.  She does not require any surveillance imaging or CEA at this time.  Given her personal history of colon cancer at young age as well as colon cancer related death of her father in her 46s she is being managed as a Lynch syndrome patient although she did not have any genetic features suggestive of Lynch syndrome.  2.  With regards to Lynch: She will be seeing Dr. Marius Ditch for getting colonoscopies every 1 to 2 years.  I will also obtain a urinalysis at this time.  I will refer her to GYN for continuing her yearly pelvic exams and pelvic ultrasound.  She will also need to see dermatology to continue her yearly skin exams.  3.  Patient wanted to discuss if I would be checking her serum or urine metanephrines given her history of dysautonomia and pots.  I will defer this to cardiology as they would be the one managing this condition.  4.  History of iron deficiency anemia: I will check her CBC ferritin and iron studies as well as B12 and folate today.  If she has any evidence of iron deficiency I will give her a call and set her up for IV Venofer given her history of mast cell activation syndrome.  She will also likely need IV Benadryl before each dose of venofer  I will see her back in 2 months time with repeat CBC ferritin and iron studies   Total face to face encounter time for this patient visit was 40 min. >50% of the time was  spent in counseling and coordination of care.   Thank you for this kind referral and the opportunity to participate in  the care of this patient   Visit Diagnosis 1. History of colon cancer   2. Iron deficiency anemia, unspecified iron deficiency anemia type     Dr. Randa Evens, MD, MPH [facility] [phone] 09/03/2018 1:03 PM

## 2018-09-03 NOTE — Progress Notes (Signed)
Ordered pelvic ultrasound per referral request from Dr. Janese Banks.

## 2018-09-03 NOTE — Telephone Encounter (Signed)
Cancel FERAHEME x 2 per Courtney/Verbal, per ORDER ERROR. Autumn Zhang will notify patient of Tx not needed.

## 2018-09-03 NOTE — Telephone Encounter (Signed)
spoke with the patient to inform her that i miss read the note from Dr. Janese Banks and she will not need IV Iron at this time. The patient was understanding and agreeable . I have inform the patient to continue to keep her schedule appointment and continue medication. I have inform maria that the [facility] appointment need to cancelled.

## 2018-09-07 ENCOUNTER — Other Ambulatory Visit (HOSPITAL_COMMUNITY)
Admission: RE | Admit: 2018-09-07 | Discharge: 2018-09-07 | Disposition: A | Discharge status: Home / Self Care | Payer: No Typology Code available for payment source | Source: Ambulatory Visit | Attending: Obstetrics and Gynecology | Admitting: Obstetrics and Gynecology

## 2018-09-07 ENCOUNTER — Encounter: Payer: Self-pay | Admitting: Obstetrics and Gynecology

## 2018-09-07 ENCOUNTER — Ambulatory Visit (INDEPENDENT_AMBULATORY_CARE_PROVIDER_SITE_OTHER): Payer: No Typology Code available for payment source

## 2018-09-07 ENCOUNTER — Ambulatory Visit (INDEPENDENT_AMBULATORY_CARE_PROVIDER_SITE_OTHER): Payer: No Typology Code available for payment source | Admitting: Obstetrics and Gynecology

## 2018-09-07 VITALS — BP 100/70 | HR 74 | Ht 62.0 in | Wt 138.0 lb

## 2018-09-07 DIAGNOSIS — Z1239 Encounter for other screening for malignant neoplasm of breast: Secondary | ICD-10-CM

## 2018-09-07 DIAGNOSIS — Z124 Encounter for screening for malignant neoplasm of cervix: Secondary | ICD-10-CM

## 2018-09-07 DIAGNOSIS — Z85038 Personal history of other malignant neoplasm of large intestine: Secondary | ICD-10-CM

## 2018-09-07 DIAGNOSIS — D250 Submucous leiomyoma of uterus: Secondary | ICD-10-CM

## 2018-09-07 DIAGNOSIS — N898 Other specified noninflammatory disorders of vagina: Secondary | ICD-10-CM | POA: Insufficient documentation

## 2018-09-07 DIAGNOSIS — Z1151 Encounter for screening for human papillomavirus (HPV): Secondary | ICD-10-CM | POA: Insufficient documentation

## 2018-09-07 MED ORDER — ESTRADIOL 0.1 MG/GM VA CREA: 1.0000 | TOPICAL_CREAM | VAGINAL | 2 refills | Status: DC

## 2018-09-07 NOTE — Patient Instructions (Signed)
I value your feedback and entrusting us with your care. If you get a [facility] patient survey, I would appreciate you taking the time to let us know about your experience today. Thank you! 

## 2018-09-07 NOTE — Progress Notes (Signed)
Autumn Carson, MD   Chief Complaint  Patient presents with  . Gynecologic Exam    u/s    HPI:      [name] is a 47 y.o. G1P0010 who LMP was No LMP recorded. (Menstrual status: Other)., presents today for NP ref for GYN u/s and pelvic exam, referred by Dr. Janese Banks. Pt with hx of colon cancer age 63 and strong FH of cancers on both sides. Had neg cancer genetic testing at [facility] in ~2015/2016 Cephus Shelling) with a POLB VUS in research trials. Being followed at [facility]. Dr. Janese Banks wants to follow pt for Lynch cancers even though she had negative genetic testing then.   Pt is s/p endometrial ablation in past due to menorrhagia and chronic anemia. No VB, spotting now. Hx of leiomyoma in past.  She is not sex active due to vaginal pain/dryness. She uses estrace vag cream for sx and needs Rx RF. She gets pap smears Q5 yrs and is not sure when last one done, but was less than 5 yrs ago.  She is current on mammo this yr.  She does not do SBE. She has a FH of breast cancer in her mat aunt who was also BRCA neg.  Pt takes calcium/Vit D supp. Hx of osteopenia and mom has osteoporosis. She exercises regularly, as well as does PT for connective tissue disorder.   She is current on colonoscopies. Had stage 2 colon cancer with RT hemicolectomy.   Past Medical History:  Diagnosis Date  . BRCA negative 2015   [facility] at [facility]; has VUS  . Colon cancer ([place])   . Dysautonomia ([place])   . Ehlers-Danlos syndrome type III   . Family history of colon cancer   . Idiopathic small fiber peripheral neuropathy   . Leiomyoma   . Mast cell activation ([place])   . Migraines   . Neck pain   . Occipital neuralgia     Past Surgical History:  Procedure Laterality Date  . COLON SURGERY  2008  . ENDOMETRIAL ABLATION      Family History  Problem Relation Age of Onset  . Colon cancer Father 27  . Breast cancer Maternal Aunt 50       BRCA neg  . Endometrial cancer Maternal Aunt 50  . Colon cancer Maternal Grandmother 32   . Leukemia Maternal Grandfather     Social History   Socioeconomic History  . Marital status: Married    Spouse name: Not on file  . Number of children: Not on file  . Years of education: Not on file  . Highest education level: Not on file  Occupational History  . Not on file  Social Needs  . Financial resource strain: Not on file  . Food insecurity:    Worry: Not on file    Inability: Not on file  . Transportation needs:    Medical: Not on file    Non-medical: Not on file  Tobacco Use  . Smoking status: Never Smoker  . Smokeless tobacco: Never Used  Substance and Sexual Activity  . Alcohol use: Never    Frequency: Never  . Drug use: Never  . Sexual activity: Not Currently  Lifestyle  . Physical activity:    Days per week: Not on file    Minutes per session: Not on file  . Stress: Not on file  Relationships  . Social connections:    Talks on phone: Not on file    Gets together: Not on  file    Attends religious service: Not on file    Active member of club or organization: Not on file    Attends meetings of clubs or organizations: Not on file    Relationship status: Not on file  . Intimate partner violence:    Fear of current or ex partner: Not on file    Emotionally abused: Not on file    Physically abused: Not on file    Forced sexual activity: Not on file  Other Topics Concern  . Not on file  Social History Narrative  . Not on file    Outpatient Medications Prior to Visit  Medication Sig Dispense Refill  . acetaminophen (TYLENOL) 500 MG tablet Take 500 mg by mouth every 6 (six) hours as needed.    Marland Kitchen albuterol (ACCUNEB) 0.63 MG/3ML nebulizer solution Take 1 ampule by nebulization every 6 (six) hours as needed for wheezing.    Marland Kitchen amitriptyline (ELAVIL) 10 MG tablet Take 10 mg by mouth at bedtime.    . Ascorbic Acid (VITAMIN C) 1000 MG tablet Take 1,000 mg by mouth daily.    Marland Kitchen augmented betamethasone dipropionate (DIPROLENE-AF) 0.05 % ointment Apply  topically 2 (two) times daily.    Azucena Freed Serrata (BOSWELLIA PO) Take by mouth.    Marland Kitchen CALCIUM CITRATE PO Take by mouth.    . carboxymethylcellulose (REFRESH PLUS) 0.5 % SOLN 1 drop 3 (three) times daily as needed.    . cromolyn (GASTROCROM) 100 MG/5ML solution Take by mouth 4 (four) times daily -  before meals and at bedtime.    . cycloSPORINE (RESTASIS) 0.05 % ophthalmic emulsion 1 drop 2 (two) times daily.    . diphenhydrAMINE (BENADRYL) 12.5 MG/5ML liquid Take by mouth 4 (four) times daily as needed.    . famotidine (PEPCID) 10 MG tablet Take 10 mg by mouth 2 (two) times daily.    . ferrous sulfate 325 (65 FE) MG tablet Take 325 mg by mouth daily with breakfast.    . fexofenadine (ALLEGRA) 180 MG tablet Take 180 mg by mouth daily.    Marland Kitchen guaifenesin (HUMIBID E) 400 MG TABS tablet Take 400 mg by mouth every 4 (four) hours.    . hydrOXYzine (ATARAX/VISTARIL) 50 MG tablet Take 50 mg by mouth 3 (three) times daily as needed.    Marland Kitchen L-Lysine 500 MG CAPS Take by mouth 2 (two) times daily.    Marland Kitchen L-Theanine 100 MG CAPS Take by mouth.    . loperamide (IMODIUM) 2 MG capsule Take by mouth as needed for diarrhea or loose stools.    . magnesium oxide (MAG-OX) 400 MG tablet Take 400 mg by mouth daily.    Marland Kitchen MELATONIN SL Place under the tongue.    . montelukast (SINGULAIR) 10 MG tablet Take 10 mg by mouth at bedtime.    . polyethylene glycol (MIRALAX / GLYCOLAX) packet Take 17 g by mouth daily.    . pseudoephedrine (SUDAFED) 30 MG tablet Take 30 mg by mouth every 4 (four) hours as needed for congestion.    . psyllium (METAMUCIL) 58.6 % packet Take 1 packet by mouth daily.    . Pyridostigmine Bromide 30 MG TABS Take by mouth.    . simethicone (MYLICON) 80 MG chewable tablet Chew 80 mg by mouth every 6 (six) hours as needed for flatulence.    . Sodium Fluoride (PREVIDENT 5000 BOOSTER) 1.1 % PSTE Place onto teeth.    . TRIAMCINOLONE ACETONIDE,NASAL, NA Place into the nose.    Marland Kitchen  vitamin k 100 MCG tablet Take  100 mcg by mouth 4 (four) times daily.    Marland Kitchen estradiol (ESTRACE) 0.1 MG/GM vaginal cream Place 1 Applicatorful vaginally at bedtime.    Marland Kitchen L-THEANINE PO Take by mouth.    . pyridostigmine (MESTINON) 60 MG/5ML solution Take 60 mg by mouth 3 (three) times daily.     No facility-administered medications prior to visit.       ROS:  Review of Systems  Constitutional: Positive for fatigue. Negative for fever and unexpected weight change.  Respiratory: Positive for shortness of breath. Negative for cough and wheezing.   Cardiovascular: Negative for chest pain, palpitations and leg swelling.  Gastrointestinal: Positive for constipation, diarrhea and nausea. Negative for blood in stool and vomiting.  Endocrine: Negative for cold intolerance, heat intolerance and polyuria.  Genitourinary: Positive for dyspareunia and frequency. Negative for dysuria, flank pain, genital sores, hematuria, menstrual problem, pelvic pain, urgency, vaginal bleeding, vaginal discharge and vaginal pain.  Musculoskeletal: Positive for arthralgias and joint swelling. Negative for back pain and myalgias.  Skin: Negative for rash.  Neurological: Positive for dizziness, numbness and headaches. Negative for syncope and light-headedness.  Hematological: Negative for adenopathy. Bruises/bleeds easily.  Psychiatric/Behavioral: Positive for agitation. Negative for confusion, sleep disturbance and suicidal ideas. The patient is not nervous/anxious.     OBJECTIVE:   Vitals:  BP 100/70   Pulse 74   Ht '5\' 2"'$  (1.575 m)   Wt 138 lb (62.6 kg)   BMI 25.24 kg/m   Physical Exam Vitals signs reviewed.  Constitutional:      Appearance: She is well-developed.  Pulmonary:     Effort: Pulmonary effort is normal.  Genitourinary:    Pubic Area: No rash.      Labia:        Right: No rash, tenderness or lesion.        Left: No rash, tenderness or lesion.      Vagina: Normal. No vaginal discharge, erythema or tenderness.     Cervix:  Normal.     Uterus: Normal. Not enlarged and not tender.      Adnexa: Right adnexa normal and left adnexa normal.       Right: No mass or tenderness.         Left: No mass or tenderness.    Musculoskeletal: Normal range of motion.  Neurological:     Mental Status: She is alert and oriented to person, place, and time.  Psychiatric:        Behavior: Behavior normal.        Thought Content: Thought content normal.     Results:  ULTRASOUND REPORT  Location: [facility]  Date of Service: 09/07/2018    Indications: History of colon cancer Findings:  The uterus is anteverted and measures 4.7 x 3.1 x 1.8cm. Echo texture is homogenous with evidence of focal mass. Within the uterus is one suspected fibroid measuring: Fibroid 1:  1.0 x 1.1 x 0.8cm (LT/anterior, SM)  The Endometrium measures 1.4 mm.  Right Ovary measures 1.8 x 1.0 x 0.9 cm. It is normal in appearance. Left Ovary measures 1.5 x 1.0 x 1.1 cm. It is normal in appearance. Survey of the adnexa demonstrates no adnexal masses. There is no free fluid in the cul de sac.  Impression: 1. Small submucosal fibroid.  Recommendations: 1.Clinical correlation with the patient's History and Physical Exam.  Vita Barley, RDMS RVT  Assessment/Plan: History of colon cancer - Concern for Lynch sydrome given FH. Update  MyRisk testing done since addl genes identified since last genetic testing. Will call with results. Neg GYN u/s.  - Plan: Integrated BRACAnalysis ([facility])  Cervical cancer screening - Plan: Cytology - PAP  Screening for HPV (human papillomavirus) - Plan: Cytology - PAP  Vaginal dryness - Rx RF estrace crm.  - Plan: estradiol (ESTRACE) 0.1 MG/GM vaginal cream  Screening for breast cancer - Pt states she's current on mammo this yr. Does yearly.     Meds ordered this encounter  Medications  . estradiol (ESTRACE) 0.1 MG/GM vaginal cream    Sig: Place 1 Applicatorful vaginally 2  (two) times a week.    Dispense:  42.5 g    Refill:  2    Order Specific Question:   Supervising Provider    Answer:   Gae Dry [[id number]]      Return in about 1 year (around 09/08/2019).  Leno Mathes B. Onesti Bonfiglio, PA-C 09/07/2018 10:42 AM

## 2018-09-09 NOTE — Telephone Encounter (Signed)
Anyway to pull these attachments off and scan in pt's chart? Thx

## 2018-09-10 ENCOUNTER — Ambulatory Visit: Payer: No Typology Code available for payment source | Admitting: Physical Therapy

## 2018-09-10 ENCOUNTER — Ambulatory Visit: Payer: No Typology Code available for payment source

## 2018-09-10 DIAGNOSIS — M25562 Pain in left knee: Secondary | ICD-10-CM

## 2018-09-10 DIAGNOSIS — G8929 Other chronic pain: Secondary | ICD-10-CM

## 2018-09-10 DIAGNOSIS — M25552 Pain in left hip: Secondary | ICD-10-CM

## 2018-09-10 DIAGNOSIS — M542 Cervicalgia: Secondary | ICD-10-CM

## 2018-09-10 DIAGNOSIS — S43119S Subluxation of unspecified acromioclavicular joint, sequela: Secondary | ICD-10-CM

## 2018-09-10 DIAGNOSIS — M6289 Other specified disorders of muscle: Secondary | ICD-10-CM

## 2018-09-10 DIAGNOSIS — R278 Other lack of coordination: Secondary | ICD-10-CM

## 2018-09-10 LAB — CYTOLOGY - PAP
Diagnosis: NEGATIVE
HPV: NOT DETECTED

## 2018-09-10 NOTE — Therapy (Signed)
[place] MAIN [facility] SERVICES [address] [place], [place], [address] Phone: [phone]   Fax:  [phone]  Physical Therapy Treatment  Patient Details  Name: Autumn Zhang MRN: [id number] Date of Birth: 09-10-1971 Referring Provider (PT): Candelaria Stagers    Encounter Date: 09/10/2018  PT End of Session - 09/10/18 1357    Visit Number  5    Number of Visits  12    PT Start Time  0900    PT Stop Time  1000    PT Time Calculation (min)  60 min    Activity Tolerance  Patient tolerated treatment well       Past Medical History:  Diagnosis Date  . BRCA negative 2015   [facility] at [facility]; has VUS  . Colon cancer ([place])   . Dysautonomia ([place])   . Ehlers-Danlos syndrome type III   . Family history of colon cancer   . Idiopathic small fiber peripheral neuropathy   . Leiomyoma   . 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 Migraines   . Neck pain   . Occipital neuralgia     Past Surgical History:  Procedure Laterality Date  . COLON SURGERY  2008  . ENDOMETRIAL ABLATION      There were no vitals filed for this visit.  Subjective Assessment - 09/10/18 0929    Subjective  Pt reported she is not able to tell how to do the shoudler exercies right without feeling the R shoulder moving out of the joint.  When she thinks about muscles moving down with the shoulder blade, she is not able to separate the muscles with the shoulder in the front.     Pertinent History  Pt is stressed by all of her medical conditons. One year ago, pt was able a carry a drum at the waist but she is not able anymore. Pt drummed 1.5 years but fatigue and pain limited her from continuing. Pt enjoyed hiking, dancing, and traveling but these are also limited by her conditions. 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  Mast cell activation and dysautonomia ( Dx 2019). POTS  Symptoms for past 20 years but Sx are better as she has gotten older.      Patient Stated Goals  1) empty bowels completely  2) Shoulder goals: be able to type longer than 10 mins , put on clothes over head/ jackets, sweaters, putting arms up, clearing dishwasher from bottom rack   3) hip: walking > 45 min, rotating hip out and lifting thigh for stairs, sitting up straight 30 min.           [facility] PT Assessment - 09/10/18 1343      Observation/Other Assessments   Observations  good carry over with scap/ cervical stabilization but with excessive effort with depression    significantly less forward head     AROM   Overall AROM Comments  5 deg R shoulder w/ pain,       Bed Mobility   Bed Mobility  --   greater ease/alignment w/ L logrolling to sit                   [facility] Adult PT Treatment/Exercise - 09/10/18 1343      Neuro Re-ed    Neuro Re-ed Details   see pt instructions  Manual Therapy   Kinesiotex  --   over R shoulder for approximation                  PT Long Term Goals - 08/24/18 1355      PT LONG TERM GOAL #1   Title  Pt will decrease her NDI score from % to < % in order to restore functional activities     Time  12    Period  Weeks    Status  On-going      PT LONG TERM GOAL #2   Title  Pt will increase her score on PSFS score with walking > 45 min 5/10 , sit on hard surface, upright 5/10, rotate hip 2/10 to > 8/10 in order to perform ADLs    Time  12    Period  Weeks    Status  On-going      PT LONG TERM GOAL #3   Title  Pt will decrease COREFO score from 34% to <24 % in order to restore GI function      Time  12    Status  On-going      PT LONG TERM GOAL #4   Title  Pt will demo less forward head / dowager's hump, alignment of auditory meatus at acromium in order to minimize neck pain and provide scapular stability for reaching with arms     Time  8    Period  Weeks    Status  On-going      PT LONG TERM GOAL #5   Title  Pt  will demo IND with stability exercises to minimze hypermobilty compensations 2/2 EDS     Time  4    Period  Weeks    Status  On-going      PT LONG TERM GOAL #6   Title  Pt will demo proper pelvic floor coordination and lengthening in order to progress to deep core stabilization exercises and minimize straining with bowel movements     Time  4    Period  Weeks    Status  On-going            Plan - 09/10/18 1358    Clinical Impression Statement  Pt required neuro-re-education to minimize overeffort with scapular depression which caused increased glenohumeral joint pain 2/2 her labral tear. Modified exercises in hooklying eliminate gravity and educated on use of towel / handtowel support to minmize pain. Applied kinesiotape to support anterior/ posterior GH joint/ scapula but required less tension to minimize pain. Discussed w/ pt on f/u with Dr. Candelaria Stagers with B shoulder issues. Plan to communicate with MD re: precautions/ contraindications and the degree of her tear. Pt continues to show less forward head and good carry over with cervical retraction and extensor co-activation in upright position.  Shoulder ext causes pain.  Ran out of time to reassess pelvic floor. Plan to address pelvic mm next session and advance with RTC strengthening in gravity eliminate position with AAROM and isometrics to strengthen shoulder. Pt continues to benefit from skilled PT     Rehab Potential  Good    PT Frequency  1x / week    PT Duration  12 weeks    PT Treatment/Interventions  Therapeutic activities;Neuromuscular re-education;Balance training;Energy conservation;Taping;Manual techniques;Patient/family education;Therapeutic exercise;Moist Heat;Electrical Stimulation;Scar mobilization    Consulted and Agree with Plan of Care  Patient       Patient will benefit from skilled therapeutic intervention in order to improve the following  deficits and impairments:  Decreased scar mobility, Improper body mechanics,  Increased edema, Impaired perceived functional ability, Decreased strength, Decreased safety awareness, Decreased coordination, Decreased balance, Decreased endurance, Decreased mobility, Hypomobility, Increased muscle spasms, Postural dysfunction, Increased fascial restricitons, Decreased activity tolerance  Visit Diagnosis: Other lack of coordination  Chronic pain of left knee  Subluxation of acromioclavicular joint, unspecified laterality, sequela  Cervicalgia  Pain in left hip  Pelvic floor dysfunction     Problem List Patient Active Problem List   Diagnosis Date Noted  . Vaginal dryness 09/07/2018    Jerl Mina ,PT, DPT, E-RYT  09/10/2018, 2:04 PM  [place] MAIN [facility] SERVICES [address] [place], [place], [address] Phone: [phone]   Fax:  [phone]  Name: Gary Bultman MRN: [id number] Date of Birth: [birth year]

## 2018-09-10 NOTE — Patient Instructions (Addendum)
More shoulder exercises in hooklying to relieve pain,   Towel folded into thirds, long wise , small roll under neck ,  Folded handtowels , corner under shoulder   Less effort with shoulder exercises, initiate with breathing, on exhale, slight motions at ribs, cervical "J" scoop,  Less effort with shoulder muscles activation

## 2018-09-13 ENCOUNTER — Ambulatory Visit: Payer: No Typology Code available for payment source | Admitting: Physical Therapy

## 2018-09-17 ENCOUNTER — Ambulatory Visit: Payer: 59 | Admitting: Hematology & Oncology

## 2018-09-17 ENCOUNTER — Ambulatory Visit: Payer: No Typology Code available for payment source

## 2018-09-20 ENCOUNTER — Ambulatory Visit: Payer: No Typology Code available for payment source | Attending: Gastroenterology | Admitting: Physical Therapy

## 2018-09-20 ENCOUNTER — Encounter: Payer: Self-pay | Admitting: Obstetrics and Gynecology

## 2018-09-20 ENCOUNTER — Encounter: Payer: No Typology Code available for payment source | Admitting: Physical Therapy

## 2018-09-20 DIAGNOSIS — M542 Cervicalgia: Secondary | ICD-10-CM | POA: Insufficient documentation

## 2018-09-20 DIAGNOSIS — M25562 Pain in left knee: Secondary | ICD-10-CM | POA: Insufficient documentation

## 2018-09-20 DIAGNOSIS — M6289 Other specified disorders of muscle: Secondary | ICD-10-CM | POA: Diagnosis present

## 2018-09-20 DIAGNOSIS — S43119S Subluxation of unspecified acromioclavicular joint, sequela: Secondary | ICD-10-CM | POA: Insufficient documentation

## 2018-09-20 DIAGNOSIS — R278 Other lack of coordination: Secondary | ICD-10-CM | POA: Insufficient documentation

## 2018-09-20 DIAGNOSIS — G8929 Other chronic pain: Secondary | ICD-10-CM | POA: Diagnosis present

## 2018-09-20 DIAGNOSIS — M25552 Pain in left hip: Secondary | ICD-10-CM | POA: Diagnosis present

## 2018-09-20 NOTE — Telephone Encounter (Signed)
Can you check with [employer] about this? It may be denial for only a couple of the genes but it's still covered overall. Thx

## 2018-09-21 NOTE — Therapy (Addendum)
[place] MAIN [facility] SERVICES [address] [place], [place], [address] Phone: [phone]   Fax:  [phone]  Physical Therapy Treatment  Patient Details  Name: Autumn Zhang MRN: [id number] Date of Birth: 10/30/1970 Referring Provider (PT): Candelaria Stagers    Encounter Date: 09/20/2018  PT End of Session - 09/21/18 1651    Visit Number  6    Number of Visits  12    PT Start Time  0902    PT Stop Time  1000    PT Time Calculation (min)  58 min    Activity Tolerance  Patient tolerated treatment well       Past Medical History:  Diagnosis Date  . BRCA negative 2015   Amb[facility] at [facility]; has VUS  . Colon cancer ([place])   . Dysautonomia ([place])   . Ehlers-Danlos syndrome type III   . Family history of colon cancer   . Idiopathic small fiber peripheral neuropathy   . Leiomyoma   . 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 Migraines   . Neck pain   . Occipital neuralgia     Past Surgical History:  Procedure Laterality Date  . COLON SURGERY  2008  . ENDOMETRIAL ABLATION      There were no vitals filed for this visit.  Subjective Assessment - 09/20/18 0911    Subjective  Pt reported she saw Dr. Candelaria Stagers and will begin physical therapy for her R shoulder at [facility]       Pertinent History  Pt is stressed by all of her medical conditons. One year ago, pt was able a carry a drum at the waist but she is not able anymore. Pt drummed 1.5 years but fatigue and pain limited her from continuing. Pt enjoyed hiking, dancing, and traveling but these are also limited by her conditions. 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  Mast cell activation and dysautonomia ( Dx 2019). POTS Symptoms for past 20 years but Sx are better as she has gotten older.      Patient Stated Goals  1) empty bowels completely  2) Shoulder goals: be able to type longer than 10  mins , put on clothes over head/ jackets, sweaters, putting arms up, clearing dishwasher from bottom rack   3) hip: walking > 45 min, rotating hip out and lifting thigh for stairs, sitting up straight 30 min.           [facility] PT Assessment - 09/21/18 1649      Bed Mobility   Bed Mobility  --   sit ->hooklying,proper alignment on L shoulder, no wincing               Pelvic Floor Special Questions - 09/21/18 1646    Pelvic Floor Internal Exam  Pt consented to exam without contraindications     Exam Type  Vaginal    Palpation  increased tightness and winching tenderness,obt int B, 7 o'clock with fascial restrictions   and externally at labia minora R and ischiocavernosus ( decreased post Tx. Tx was modified with less pressure and deferred to external Tx when pt displayed wincing tenderness during  internal Tx.   downward movemetn of bladder with simulated cough. Bladder in lowered position within introitus       Strength  good squeeze, good lift, able to hold agaisnt strong  resistance        [facility] Adult PT Treatment/Exercise - 09/21/18 1649      Therapeutic Activites    Therapeutic Activities  --   instructed abdominal massage      Neuro Re-ed    Neuro Re-ed Details   cued for proper pelvic floor lengthening during internal pelvic floor assessment and post to relieve pain    cued for sequential movement of 3 layers of mm .      Manual Therapy   Manual therapy comments  abdominal massage  .external palpation at labia minora, ischiocavernosus R    Internal Pelvic Floor  obt int B, 7 o'clock with fascial restrictions    STM/ MWM                  PT Long Term Goals - 08/24/18 1355      PT LONG TERM GOAL #1   Title  Pt will decrease her NDI score from % to < % in order to restore functional activities     Time  12    Period  Weeks    Status  On-going      PT LONG TERM GOAL #2   Title  Pt will increase her score on PSFS score with walking > 45 min 5/10 , sit  on hard surface, upright 5/10, rotate hip 2/10 to > 8/10 in order to perform ADLs    Time  12    Period  Weeks    Status  On-going      PT LONG TERM GOAL #3   Title  Pt will decrease COREFO score from 34% to <24 % in order to restore GI function      Time  12    Status  On-going      PT LONG TERM GOAL #4   Title  Pt will demo less forward head / dowager's hump, alignment of auditory meatus at acromium in order to minimize neck pain and provide scapular stability for reaching with arms     Time  8    Period  Weeks    Status  On-going      PT LONG TERM GOAL #5   Title  Pt will demo IND with stability exercises to minimze hypermobilty compensations 2/2 EDS     Time  4    Period  Weeks    Status  On-going      PT LONG TERM GOAL #6   Title  Pt will demo proper pelvic floor coordination and lengthening in order to progress to deep core stabilization exercises and minimize straining with bowel movements     Time  4    Period  Weeks    Status  On-going            Plan - 09/21/18 1651    Clinical Impression Statement  Performed vaginal assessement today which showed increased fascial tightness in posterior wall and wincing tenderness. Modified palpation pressure and utilized movement with mobilization techniques to decrease tenderness while still mobilizing fascial restrictions. Pt tolerated internal Tx but DPT deferred to external manual Tx which facilitated less tightness at R labia minora and ischiocavernosus. Pt reported burning post Tx and thus, pt was educated on ways to decrease burning with ice and self- movement with mobilization techniques along with coordinated breathing to lengthening pelvic floor.  Pt was educated the role of mobility of abdominopelvic tissue to increase blood flow and proper motility.     Rehab Potential  Good    PT Frequency  1x / week    PT Duration  12 weeks    PT Treatment/Interventions  Therapeutic activities;Neuromuscular re-education;Balance  training;Energy conservation;Taping;Manual techniques;Patient/family education;Therapeutic exercise;Moist Heat;Electrical Stimulation;Scar mobilization    Consulted and Agree with Plan of Care  Patient       Patient will benefit from skilled therapeutic intervention in order to improve the following deficits and impairments:  Decreased scar mobility, Improper body mechanics, Increased edema, Impaired perceived functional ability, Decreased strength, Decreased safety awareness, Decreased coordination, Decreased balance, Decreased endurance, Decreased mobility, Hypomobility, Increased muscle spasms, Postural dysfunction, Increased fascial restricitons, Decreased activity tolerance  Visit Diagnosis: Other lack of coordination  Chronic pain of left knee  Subluxation of acromioclavicular joint, unspecified laterality, sequela   Cervicalgia  Pain in left hip  Pelvic floor dysfunction     Problem List Patient Active Problem List   Diagnosis Date Noted  . Vaginal dryness 09/07/2018    Jerl Mina ,PT, DPT, E-RYT  09/21/2018, 5:01 PM  [place] MAIN [facility] SERVICES [address] [place], [place], [address] Phone: [phone]   Fax:  [phone]  Name: Autumn Zhang MRN: [id number] Date of Birth: 1971-04-23

## 2018-09-21 NOTE — Patient Instructions (Signed)
Releasing tightness in pelvic floor muscles    Happy baby pose  5 breaths    Roll toes/ foot in and out  10 reps    _________  Continue to practice breathing / pelvic floor coordination ________  Abdominal massage upward from L,  R , center to belly button 3 stroke x 3 , pressure is gentle and light with all fingers flat, not using finger tips

## 2018-09-23 ENCOUNTER — Encounter: Payer: Self-pay | Admitting: Obstetrics and Gynecology

## 2018-09-28 ENCOUNTER — Other Ambulatory Visit: Payer: Self-pay

## 2018-09-28 ENCOUNTER — Ambulatory Visit (INDEPENDENT_AMBULATORY_CARE_PROVIDER_SITE_OTHER): Payer: No Typology Code available for payment source | Admitting: Gastroenterology

## 2018-09-28 ENCOUNTER — Encounter: Payer: Self-pay | Admitting: Gastroenterology

## 2018-09-28 VITALS — BP 121/86 | HR 76 | Resp 17 | Ht 62.0 in | Wt 138.6 lb

## 2018-09-28 DIAGNOSIS — R140 Abdominal distension (gaseous): Secondary | ICD-10-CM

## 2018-09-28 DIAGNOSIS — M6289 Other specified disorders of muscle: Secondary | ICD-10-CM

## 2018-09-28 DIAGNOSIS — N816 Rectocele: Secondary | ICD-10-CM | POA: Diagnosis not present

## 2018-09-28 DIAGNOSIS — Z85038 Personal history of other malignant neoplasm of large intestine: Secondary | ICD-10-CM

## 2018-09-28 MED ORDER — RIFAXIMIN 550 MG PO TABS: 550.0000 mg | ORAL_TABLET | Freq: Two times a day (BID) | ORAL | 0 refills | Status: AC

## 2018-09-28 NOTE — Progress Notes (Signed)
[name], MD [address]  [place]  [place],  [address]  Main: [phone]  Fax: [phone]    Gastroenterology Consultation  Referring Provider:     Leticia Clas, MD Primary Care Physician:  Autumn Carson, MD Primary Gastroenterologist:  Dr. [name] Reason for Consultation:     Pelvic floor dyssynergia, abdominal bloating        HPI:   Autumn Zhang is a 48 y.o. Caucasian female referred by Dr. Herma Carson, MD  for consultation & management of constellation of upper and lower GI symptoms.  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  She underwent right hemicolectomy which revealed poorly differentiated invasive adenocarcinoma T3N0. She received 12 cycles of adjuvant chemotherapy with 5-FU leucovorin. 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 her father died of colon cancer at the age of 55.  Her only brother has also had colon polyps.  She has therefore been managed as a Lynch syndrome patient despite not having genetic features suggestive of Lynch syndrome.  She took prophylactic aspirin for a while and then she stopped taking it.  She has been getting colonoscopies every 1 to 2 years.  Her last colonoscopy was in 2018 at outside hospital.  Patient has history of Ehlers-Danlos syndrome.  Currently, patient is going through migraine attack and waiting glasses and he hat.  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  Moved from [place] to [place] and here to establish care for her ongoing GI symptoms.  Previously, she saw Dr. Carlton Adam at [facility], Dr Heath Lark at [facility] in [place].  Her GI symptoms include pelvic floor dyssynergia, chronic constipation although having several bowel movements daily with incomplete emptying, nonbloody, abdominal bloating.  She is diagnosed with mild pelvic floor  dyssynergia based on MRI defecography, and mild rectocele at [facility] in 05/2018.  She also underwent smart pill study at [facility] in 08/2018 which revealed normal small bowel transit time.  She underwent pelvic floor PT in [place] and did not find it helpful.  Her most concerning symptom today is frequent soft bowel movements anywhere from 5 to 6/day, nonbloody associated with significant abdominal bloating.  She was told that she had considered based on the imaging studies and therefore tried linaclotide in the past and did not tolerate it well.   She has history of heartburn, epigastric pain, regurgitation which are manageable.  Her weight has been stable.  She underwent nuclear medicine gastric emptying study which was normal in 03/2018.  Patient is also here to discuss about surveillance colonoscopy given her history of colon cancer and treating as Lynch syndrome.  She reports undergoing surveillance EGD in 2016 which was reportedly normal.  Patient reports that she has history of iron deficiency anemia but her most recent labs returned unremarkable including normal iron studies.  She takes iron, B12, vitamin C, calcium and vitamin D.  She has history of mast cell activation syndrome for which she takes cromolyn sodium and scheduled to see allergy immunology at [facility].  Patient reports that she has several intolerances and allergies to medications and is very careful about trying new medication.  She does not smoke or drink alcohol  NSAIDs: None  Antiplts/Anticoagulants/Anti thrombotics: None  GI Procedures:  Colonoscopy in 2018 by Dr Gala Lewandowsky   Past Medical History:  Diagnosis Date  . BRCA negative 09/2018   2015 [place] at [facility]; has VUS; 1/20 MyRisk neg except RPS20 VUS; IBIS=10.8%/riskscore=12%  . Colon cancer ([place])   . Dysautonomia ([place])   . Ehlers-Danlos syndrome type III   . Family history of colon cancer   . Idiopathic small fiber peripheral  neuropathy   . Leiomyoma   . Mast cell activation ([place])   . Migraines   . Neck pain   . Occipital neuralgia     Past Surgical History:  Procedure Laterality Date  . COLON SURGERY  2008  . ENDOMETRIAL ABLATION      Current Outpatient Medications:  .  acetaminophen (TYLENOL) 500 MG tablet, Take 500 mg by mouth every 6 (six) hours as needed., Disp: , Rfl:  .  albuterol (PROVENTIL HFA;VENTOLIN HFA) 108 (90 Base) MCG/ACT inhaler, Inhale into the lungs., Disp: , Rfl:  .  amitriptyline (ELAVIL) 10 MG tablet, Take 10 mg by mouth at bedtime., Disp: , Rfl:  .  Ascorbic Acid (VITAMIN C) 1000 MG tablet, Take 1,000 mg by mouth daily., Disp: , Rfl:  .  augmented betamethasone dipropionate (DIPROLENE-AF) 0.05 % ointment, Apply topically 2 (two) times daily., Disp: , Rfl:  .  Blood Pressure Monitoring (BLOOD PRESSURE CUFF) MISC, Take blood pressure as needed for symptoms., Disp: , Rfl:  .  Boswellia Serrata (BOSWELLIA PO), Take by mouth., Disp: , Rfl:  .  CALCIUM CITRATE PO, Take by mouth., Disp: , Rfl:  .  cromolyn (GASTROCROM) 100 MG/5ML solution, Take by mouth 4 (four) times daily -  before meals and at bedtime., Disp: , Rfl:  .  cycloSPORINE (RESTASIS) 0.05 % ophthalmic emulsion, 1 drop 2 (two) times daily., Disp: , Rfl:  .  diphenhydrAMINE (BENADRYL) 12.5 MG/5ML liquid, Take by mouth 4 (four) times daily as needed., Disp: , Rfl:  .  diphenhydrAMINE-Allantoin 2-0.5 % CREA, Apply topically., Disp: , Rfl:  .  EPINEPHrine 0.3 mg/0.3 mL IJ SOAJ injection, INJECT ONE AUTO-INJECTOR INTRAMUSCULARLY AS NEEDED, Disp: , Rfl:  .  estradiol (ESTRACE) 0.1 MG/GM vaginal cream, Place 1 Applicatorful vaginally 2 (two) times a week., Disp: 42.5 g, Rfl: 2 .  famotidine (PEPCID) 10 MG tablet, Take 10 mg by mouth 2 (two) times daily., Disp: , Rfl:  .  ferrous sulfate 325 (65 FE) MG tablet, Take 325 mg by mouth daily with breakfast., Disp: , Rfl:  .  fexofenadine (ALLEGRA) 180 MG tablet, Take 180 mg by mouth daily.,  Disp: , Rfl:  .  guaifenesin (HUMIBID E) 400 MG TABS tablet, Take 400 mg by mouth every 4 (four) hours., Disp: , Rfl:  .  hydrOXYzine (ATARAX/VISTARIL) 50 MG tablet, Take 50 mg by mouth 3 (three) times daily as needed., Disp: , Rfl:  .  L-Lysine 500 MG CAPS, Take by mouth 2 (two) times daily., Disp: , Rfl:  .  levocetirizine (XYZAL) 5 MG tablet, Take by mouth., Disp: , Rfl:  .  Light Mineral Oil-Mineral Oil 0.5-0.5 % EMUL, Apply to eye., Disp: , Rfl:  .  loperamide (IMODIUM) 2 MG capsule, Take by mouth as needed for diarrhea or loose stools., Disp: , Rfl:  .  Lysine HCl 1000 MG TABS, Take by mouth., Disp: , Rfl:  .  magnesium oxide (MAG-OX) 400 MG tablet, Take 400 mg by mouth daily., Disp: , Rfl:  .  Melatonin 1 MG/ML LIQD, Take by mouth., Disp: , Rfl:  .  montelukast (SINGULAIR) 10 MG tablet, Take 10 mg by mouth at bedtime., Disp: ,  Rfl:  .  pseudoephedrine (SUDAFED) 30 MG tablet, Take 30 mg by mouth every 4 (four) hours as needed for congestion., Disp: , Rfl:  .  Pyridostigmine Bromide 30 MG TABS, Take by mouth., Disp: , Rfl:  .  simethicone (MYLICON) 80 MG chewable tablet, Chew 80 mg by mouth every 6 (six) hours as needed for flatulence., Disp: , Rfl:  .  Sodium Fluoride (PREVIDENT 5000 BOOSTER) 1.1 % PSTE, Place onto teeth., Disp: , Rfl:  .  TRIAMCINOLONE ACETONIDE,NASAL, NA, Place into the nose., Disp: , Rfl:  .  Vitamin D, Cholecalciferol, 10 MCG (400 UNIT) TABS, Take by mouth., Disp: , Rfl:  .  vitamin k 100 MCG tablet, Take 100 mcg by mouth 4 (four) times daily., Disp: , Rfl:  .  albuterol (ACCUNEB) 0.63 MG/3ML nebulizer solution, Take 1 ampule by nebulization every 6 (six) hours as needed for wheezing., Disp: , Rfl:  .  betamethasone dipropionate (DIPROLENE) 0.05 % cream, Apply topically., Disp: , Rfl:  .  Boswellia Serrata Extract POWD, Take by mouth., Disp: , Rfl:  .  carboxymethylcellulose (REFRESH PLUS) 0.5 % SOLN, 1 drop 3 (three) times daily as needed., Disp: , Rfl:  .  cloNIDine  (CATAPRES) 0.1 MG tablet, , Disp: , Rfl:  .  famotidine (PEPCID) 20 MG tablet, Take by mouth., Disp: , Rfl:  .  ferrous sulfate 220 (44 Fe) MG/5ML solution, Take by mouth., Disp: , Rfl:  .  L-Theanine 100 MG CAPS, Take by mouth., Disp: , Rfl:  .  Lactobacillus Rhamnosus, GG, (CULTURELLE) CAPS, Take by mouth., Disp: , Rfl:  .  levocetirizine (XYZAL) 5 MG tablet, , Disp: , Rfl:  .  Magnesium Sulfate, Laxative, GRAN, by Does not apply route., Disp: , Rfl:  .  Melatonin 5 MG CAPS, Take by mouth., Disp: , Rfl:  .  MELATONIN SL, Place under the tongue., Disp: , Rfl:  .  montelukast (SINGULAIR) 4 MG PACK, Take by mouth., Disp: , Rfl:  .  pneumococcal 13-valent conjugate vaccine (PREVNAR 13) SUSP injection, Inject into the muscle., Disp: , Rfl:  .  polyethylene glycol (MIRALAX / GLYCOLAX) packet, Take 17 g by mouth daily., Disp: , Rfl:  .  psyllium (METAMUCIL) 58.6 % packet, Take 1 packet by mouth daily., Disp: , Rfl:  .  Psyllium 400 MG CAPS, Take by mouth., Disp: , Rfl:  .  pyridostigmine (MESTINON) 60 MG tablet, Take by mouth., Disp: , Rfl:  .  ranitidine (ZANTAC) 75 MG/5ML syrup, Take by mouth., Disp: , Rfl:  .  rifaximin (XIFAXAN) 550 MG TABS tablet, Take 1 tablet (550 mg total) by mouth 2 (two) times daily for 14 days., Disp: 28 tablet, Rfl: 0 .  Turmeric, Curcuma Longa, (CURCUMIN) POWD, Take by mouth., Disp: , Rfl:  .  Vitamin D, Cholecalciferol, 10 MCG (400 UNIT) TABS, Take by mouth., Disp: , Rfl:    Family History  Problem Relation Age of Onset  . Colon cancer Father 42  . Breast cancer Maternal Aunt 50       BRCA neg  . Endometrial cancer Maternal Aunt 50  . Colon cancer Maternal Grandmother 59  . Leukemia Maternal Grandfather      Social History   Tobacco Use  . Smoking status: Never Smoker  . Smokeless tobacco: Never Used  Substance Use Topics  . Alcohol use: Never    Frequency: Never  . Drug use: Never    Allergies as of 09/28/2018 - Review Complete 09/28/2018    Allergen  Reaction Noted  . Ioversol Anxiety, Hives, Itching, and Palpitations 12/05/2016  . Ascorbate Other (See Comments) 12/17/2017  . Ciprofloxacin Other (See Comments) 01/12/2018  . Eggs or egg-derived products Other (See Comments) 12/09/2017  . Ginger Other (See Comments) 12/17/2017  . Levofloxacin Other (See Comments) 01/12/2018  . Other Other (See Comments) 12/17/2017  . Soybean-containing drug products Other (See Comments) 12/17/2017  . Bupropion  08/11/2018  . Celebrex [celecoxib]  08/11/2018  . Codeine  08/11/2018  . Dexilant [dexlansoprazole]  08/11/2018  . Dulera [mometasone furo-formoterol fum]  08/11/2018  . Fentanyl  08/11/2018  . Fluticasone  08/11/2018  . Gabapentin  08/11/2018  . Gluten meal  08/11/2018  . Iodine  08/11/2018  . Lidocaine  08/11/2018  . Lyrica [pregabalin]  08/11/2018  . Mold extract [trichophyton]  08/11/2018  . Morphine and related  08/11/2018  . Novocain [procaine]  08/11/2018  . Penicillins  08/11/2018  . Pollen extract  08/11/2018  . Prilosec [omeprazole]  08/11/2018  . Salmeterol xinafoate  02/27/2012  . Silenor [doxepin hcl]  08/11/2018  . Tomato  08/11/2018  . Baclofen Rash 06/11/2018  . Clindamycin Other (See Comments) 09/07/2018  . Eggshell membrane (chicken)  [egg shells] Other (See Comments) 12/09/2017  . Epinephrine Palpitations 02/28/2016  . Hydrocodone Nausea Only 09/03/2018  . Ibuprofen Nausea Only 02/27/2012  . Lac bovis Diarrhea 11/13/2014  . Lanolin Other (See Comments) 12/09/2017  . Latex Other (See Comments) 09/03/2018  . Milk-related compounds Diarrhea 11/13/2014  . Wheat bran Diarrhea 11/13/2014    Review of Systems:    All systems reviewed and negative except where noted in HPI.   Physical Exam:  BP 121/86 (BP Location: Left Arm, Patient Position: Sitting, Cuff Size: Normal)   Pulse 76   Resp 17   Ht '5\' 2"'$  (1.575 m)   Wt 138 lb 9.6 oz (62.9 kg)   BMI 25.35 kg/m  No LMP recorded. (Menstrual status:  Other).  General:   Alert,  Well-developed, well-nourished, pleasant and cooperative in NAD Head:  Normocephalic and atraumatic. Eyes:  Sclera clear, no icterus.   Conjunctiva pink. Ears:  Normal auditory acuity. Nose:  No deformity, discharge, or lesions. Mouth:  No deformity or lesions,oropharynx pink & moist. Neck:  Supple; no masses or thyromegaly. Lungs:  Respirations even and unlabored.  Clear throughout to auscultation.   No wheezes, crackles, or rhonchi. No acute distress. Heart:  Regular rate and rhythm; no murmurs, clicks, rubs, or gallops. Abdomen:  Normal bowel sounds. Soft, non-tender and non-distended, faint midline vertical scar without masses, hepatosplenomegaly or hernias noted.  No guarding or rebound tenderness.   Rectal: Not performed Msk:  Symmetrical without gross deformities. Good, equal movement & strength bilaterally. Pulses:  Normal pulses noted. Extremities:  No clubbing or edema.  No cyanosis. Neurologic:  Alert and oriented x3;  grossly normal neurologically. Skin:  Intact without significant lesions or rashes. No jaundice. Psych:  Alert and cooperative. Normal mood and affect.  Imaging Studies: Reviewed  Assessment and Plan:   Alila Franzoni is a 48 y.o. Caucasian female with history of colon cancer in 2009 status post right hemicolectomy with primary anastomosis, neoadjuvant chemo, in remission, mast cell activation syndrome, mild pelvic floor dysfunction with mild rectocele seen in consultation for pelvic floor dysfunction, increased bowel frequency, abdominal bloating as well as to discuss about surveillance colonoscopy  Pelvic floor dysfunction resulting in retained stool Patient has several bowel movements, with incomplete emptying and abdominal bloating Recommend trial of rifaximin 550 mg twice  daily, Patient states she will think about it Suggested her to take magnesium citrate 2-3 times a week to clean out her bowels Continue pelvic floor  biofeedback She is willing to try Amitiza 8 MCG twice daily, samples provided  Surveillance colonoscopy with history of colon cancer Schedule colonoscopy   Follow up in 4 to 6 weeks   [name], MD

## 2018-09-29 ENCOUNTER — Encounter: Payer: No Typology Code available for payment source | Admitting: Physical Therapy

## 2018-09-30 ENCOUNTER — Telehealth: Payer: Self-pay | Admitting: Gastroenterology

## 2018-09-30 NOTE — Telephone Encounter (Signed)
Pt is calling she has a couple questions regarding her procedure next week she wants to know if they can add Benadryl to her IV due to some (allergies/sensativities) and if she should take her antibiotics that Dr.Vanga prescribed  Or wait until after the procedure next week please call pt

## 2018-10-01 NOTE — Telephone Encounter (Signed)
Please see prior message .Can she start her antibiotic today?

## 2018-10-05 ENCOUNTER — Ambulatory Visit: Payer: Self-pay | Admitting: Family Medicine

## 2018-10-05 ENCOUNTER — Encounter: Payer: Self-pay | Admitting: Gastroenterology

## 2018-10-05 ENCOUNTER — Encounter: Payer: Self-pay | Admitting: Family Medicine

## 2018-10-05 ENCOUNTER — Ambulatory Visit (INDEPENDENT_AMBULATORY_CARE_PROVIDER_SITE_OTHER): Payer: No Typology Code available for payment source | Admitting: Family Medicine

## 2018-10-05 VITALS — BP 128/81 | HR 79 | Temp 98.3°F | Resp 16 | Ht 62.0 in | Wt 139.0 lb

## 2018-10-05 DIAGNOSIS — G43109 Migraine with aura, not intractable, without status migrainosus: Secondary | ICD-10-CM

## 2018-10-05 DIAGNOSIS — Z85038 Personal history of other malignant neoplasm of large intestine: Secondary | ICD-10-CM | POA: Diagnosis not present

## 2018-10-05 DIAGNOSIS — R000 Tachycardia, unspecified: Secondary | ICD-10-CM | POA: Diagnosis not present

## 2018-10-05 DIAGNOSIS — D8940 Mast cell activation, unspecified: Secondary | ICD-10-CM

## 2018-10-05 DIAGNOSIS — I498 Other specified cardiac arrhythmias: Secondary | ICD-10-CM

## 2018-10-05 DIAGNOSIS — I951 Orthostatic hypotension: Secondary | ICD-10-CM

## 2018-10-05 DIAGNOSIS — G90A Postural orthostatic tachycardia syndrome (POTS): Secondary | ICD-10-CM

## 2018-10-05 DIAGNOSIS — G901 Familial dysautonomia [Riley-Day]: Secondary | ICD-10-CM

## 2018-10-05 DIAGNOSIS — Z85828 Personal history of other malignant neoplasm of skin: Secondary | ICD-10-CM

## 2018-10-05 DIAGNOSIS — N816 Rectocele: Secondary | ICD-10-CM

## 2018-10-05 DIAGNOSIS — Q796 Ehlers-Danlos syndrome, unspecified: Secondary | ICD-10-CM

## 2018-10-05 DIAGNOSIS — M26622 Arthralgia of left temporomandibular joint: Secondary | ICD-10-CM

## 2018-10-05 NOTE — Progress Notes (Signed)
Patient: Autumn Zhang Female    DOB: [birth year]   48 y.o.   MRN: [id number] Visit Date: 10/05/2018  Today's Provider: [name], MD   Chief Complaint  Patient presents with  . Establish Care   Subjective:     HPI   Patient presents today to establish care.  She was previously being seen by a PCP in [facility], Dr. Terrial Rhodes internal medicine and pediatrics.  She states that she now lives in [place] and this is closer.  She is not sure if she would like to turn all primary care over to our office or continue to see Dr. Purcell Nails as well.  Up until recently, she was living in [place] and she brings a letter from her previous PCP Dr. Cassell Clement that outlines her medical conditions.  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  This letter as well as one from her cardiologist and neurologist are scanned into the chart.  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    She has a history of colon cancer and follows with oncology both.  She is status post partial colectomy.  Though she has not had any genetic testing positive for Lynch syndrome, she states that she is also followed by OB/GYN with annual pelvic ultrasounds to evaluate her ovaries in case this is a genetic syndrome.  She was treated with surgery and chemotherapy.  She did not need radiation.  She sees GI for follow-up colonoscopies regularly.  In regards to her Ehlers-Danlos syndrome, she has joint and muscle pains and joint instability.  She has cervical dystonia, temporomandibular joint dysfunction, chronic jaw pain, occipital neuralgia, instability of bilateral shoulders, shoulder labral tear, left knee instability, and low back pain.  She is followed by an orthopedist as well as an orofacial pain specialist.  Her  Ehlers-Danlos syndrome is been symptomatic for several years, but she was formally diagnosed in 2018.  Reports state that the prognosis of her shoulders and jaw are poor and there is no expectation that functioning will improve substantially.  Patient wears a hard cervical collar when she showers, walks, and rides as a passenger in any vehicle and she wears an elastic band around her head to support her jaw at all times.  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  She is also no longer able to drive and being a passenger in a car severely exacerbates her suboccipital pain.  Patient is also followed by cardiology for her dysautonomia, pots.  She states she has found a new cardiologist in the area.  She is taking Mestinon at a low dose.  Patient also has frequent pelvic floor physical therapy due to her rectocele and pelvic floor dysfunction.  Patient is followed by neurology for her migraines, sleep apnea, restless leg syndrome.  Patient is also followed by allergy and immunology for her mast cell activation syndrome.  She is at increased risk for anaphylaxis and carries an EpiPen at all times.  She tries to avoid mast cell triggers, which she states for her are multiple foods and medications, emotional/physical stress, pain stimuli, and fatigue, as well as vibration in environmental stimuli.  She has also seen pulmonology for her sleep apnea.  She uses her CPAP with good compliance.  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  She is also followed by dermatology for history of basal cell carcinoma.  She is seen annually for regular skin checks.  She denies any history of melanoma.  Allergies  Allergen Reactions  . Ioversol Anxiety, Hives, Itching and Palpitations  . Ascorbate Other (See Comments)    Congestion & Headaches  . Ciprofloxacin Other (See Comments)  . Eggs Or Egg-Derived Products Other (See Comments)    Exacerbates neuralgia  symptoms Exacerbates neuralgia symptoms Congestion & Headaches Exacerbates neuralgia symptoms Congestion & Headaches   . Ginger Other (See Comments)    Congestion & Headaches Congestion & Headaches   . Levofloxacin Other (See Comments)    Congestion & Headaches Congestion & Headaches   . Other Other (See Comments)    Novocaine (IV) form Hearing loss Novocaine (IV) form Hearing loss  Congestion & Headaches Congestion & Headaches Congestion & Headaches Congestion & Headaches   . Soybean-Containing Drug Products Other (See Comments)    Congestion & Headaches   . Bupropion   . Celebrex [Celecoxib]   . Codeine   . Dexilant [Dexlansoprazole]   . Dulera [Mometasone Furo-Formoterol Fum]   . Fentanyl   . Fluticasone   . Gabapentin   . Gluten Meal   . Iodine   . Lidocaine   . Lyrica [Pregabalin]   . Mold Extract [Trichophyton]   . Morphine And Related   . Novocain [Procaine]   . Penicillins     Allergic to Fluoroquinolone antibiotics!  . Pollen Extract   . Prilosec [Omeprazole]   . Salmeterol Xinafoate   . Silenor [Doxepin Hcl]   . Tomato   . Baclofen Rash    Topical cream of baclofen2%, flurbiprofen 10%, gabapentin 6% Topical cream of baclofen2%, flurbiprofen 10%, gabapentin 6%   . Clindamycin Other (See Comments)  . Eggshell Membrane (Chicken)  [Egg Shells] Other (See Comments)    Exacerbates neuralgia symptoms  . Epinephrine Palpitations    Ok for emergencies, causes tachycardia Ok for emergencies, causes tachycardia Ok for emergencies, causes tachycardia   . Hydrocodone Nausea Only  . Ibuprofen Nausea Only    At high doses At high doses   . Lac [place]ovis Diarrhea    Facial burning sensation Facial burning sensation Facial burning sensation Facial burning sensation   . Lanolin Other (See Comments)    Exacerbates neuralgia symptoms Wool causes hives. Exacerbates neuralgia symptoms Wool causes hives. Exacerbates neuralgia symptoms Wool causes  hives. Exacerbates neuralgia symptoms Wool causes hives. Exacerbates neuralgia symptoms Wool causes hives. Exacerbates neuralgia symptoms Wool causes hives. Exacerbates neuralgia symptoms Wool causes hives. Exacerbates neuralgia symptoms Wool causes hives.   . Latex Other (See Comments)    Other Reaction: Other reaction "sensitivity" Other Reaction: Other reaction "sensitivity"   . Milk-Related Compounds Diarrhea    Facial burning sensation   . Wheat Bran Diarrhea     Current Outpatient Medications:  .  acetaminophen (TYLENOL) 500 MG tablet, Take 500 mg by mouth every 6 (six) hours as needed., Disp: , Rfl:  .  albuterol (ACCUNEB) 0.63 MG/3ML nebulizer solution, Take 1 ampule by nebulization every 6 (six) hours as needed for wheezing., Disp: , Rfl:  .  albuterol (PROVENTIL HFA;VENTOLIN HFA) 108 (90 Base) MCG/ACT inhaler, Inhale into the lungs., Disp: , Rfl:  .  amitriptyline (ELAVIL) 10 MG tablet, Take 10 mg by mouth at bedtime., Disp: , Rfl:  .  Ascorbic Acid (VITAMIN C) 1000 MG tablet, Take 1,000 mg by mouth daily., Disp: , Rfl:  .  augmented betamethasone dipropionate (DIPROLENE-AF) 0.05 % ointment, Apply topically 2 (two) times daily., Disp: , Rfl:  .  betamethasone dipropionate (DIPROLENE) 0.05 % cream, Apply topically., Disp: , Rfl:  .  Blood Pressure Monitoring (BLOOD PRESSURE CUFF) MISC, Take blood pressure as needed for symptoms., Disp: , Rfl:  .  Boswellia Serrata (BOSWELLIA PO), Take by mouth., Disp: , Rfl:  .  Boswellia Serrata Extract POWD, Take by mouth., Disp: , Rfl:  .  CALCIUM CITRATE PO, Take by mouth., Disp: , Rfl:  .  carboxymethylcellulose (REFRESH PLUS) 0.5 % SOLN, 1 drop 3 (three) times daily as needed., Disp: , Rfl:  .  cloNIDine (CATAPRES) 0.1 MG tablet, , Disp: , Rfl:  .  cromolyn (GASTROCROM) 100 MG/5ML solution, Take by mouth 4 (four) times daily -  before meals and at bedtime., Disp: , Rfl:  .  cycloSPORINE (RESTASIS) 0.05 % ophthalmic emulsion, 1  drop 2 (two) times daily., Disp: , Rfl:  .  diphenhydrAMINE (BENADRYL) 12.5 MG/5ML liquid, Take by mouth 4 (four) times daily as needed., Disp: , Rfl:  .  diphenhydrAMINE-Allantoin 2-0.5 % CREA, Apply topically., Disp: , Rfl:  .  EPINEPHrine 0.3 mg/0.3 mL IJ SOAJ injection, INJECT ONE AUTO-INJECTOR INTRAMUSCULARLY AS NEEDED, Disp: , Rfl:  .  estradiol (ESTRACE) 0.1 MG/GM vaginal cream, Place 1 Applicatorful vaginally 2 (two) times a week., Disp: 42.5 g, Rfl: 2 .  famotidine (PEPCID) 10 MG tablet, Take 10 mg by mouth 2 (two) times daily., Disp: , Rfl:  .  famotidine (PEPCID) 20 MG tablet, Take by mouth., Disp: , Rfl:  .  ferrous sulfate 220 (44 Fe) MG/5ML solution, Take by mouth., Disp: , Rfl:  .  ferrous sulfate 325 (65 FE) MG tablet, Take 325 mg by mouth daily with breakfast., Disp: , Rfl:  .  fexofenadine (ALLEGRA) 180 MG tablet, Take 180 mg by mouth daily., Disp: , Rfl:  .  guaifenesin (HUMIBID E) 400 MG TABS tablet, Take 400 mg by mouth every 4 (four) hours., Disp: , Rfl:  .  hydrOXYzine (ATARAX/VISTARIL) 50 MG tablet, Take 50 mg by mouth 3 (three) times daily as needed., Disp: , Rfl:  .  L-Lysine 500 MG CAPS, Take by mouth 2 (two) times daily., Disp: , Rfl:  .  L-Theanine 100 MG CAPS, Take by mouth., Disp: , Rfl:  .  Lactobacillus Rhamnosus, GG, (CULTURELLE) CAPS, Take by mouth., Disp: , Rfl:  .  levocetirizine (XYZAL) 5 MG tablet, Take by mouth., Disp: , Rfl:  .  levocetirizine (XYZAL) 5 MG tablet, , Disp: , Rfl:  .  Light Mineral Oil-Mineral Oil 0.5-0.5 % EMUL, Apply to eye., Disp: , Rfl:  .  loperamide (IMODIUM) 2 MG capsule, Take by mouth as needed for diarrhea or loose stools., Disp: , Rfl:  .  Lysine HCl 1000 MG TABS, Take by mouth., Disp: , Rfl:  .  magnesium oxide (MAG-OX) 400 MG tablet, Take 400 mg by mouth daily., Disp: , Rfl:  .  Magnesium Sulfate, Laxative, GRAN, by Does not apply route., Disp: , Rfl:  .  Melatonin 1 MG/ML LIQD, Take by mouth., Disp: , Rfl:  .  Melatonin 5 MG  CAPS, Take by mouth., Disp: , Rfl:  .  MELATONIN SL, Place under the tongue., Disp: , Rfl:  .  montelukast (SINGULAIR) 10 MG tablet, Take 10 mg by mouth at bedtime., Disp: , Rfl:  .  montelukast (SINGULAIR) 4 MG PACK, Take by mouth., Disp: , Rfl:  .  pneumococcal 13-valent conjugate  vaccine (PREVNAR 13) SUSP injection, Inject into the muscle., Disp: , Rfl:  .  polyethylene glycol (MIRALAX / GLYCOLAX) packet, Take 17 g by mouth daily., Disp: , Rfl:  .  pseudoephedrine (SUDAFED) 30 MG tablet, Take 30 mg by mouth every 4 (four) hours as needed for congestion., Disp: , Rfl:  .  psyllium (METAMUCIL) 58.6 % packet, Take 1 packet by mouth daily., Disp: , Rfl:  .  Psyllium 400 MG CAPS, Take by mouth., Disp: , Rfl:  .  pyridostigmine (MESTINON) 60 MG tablet, Take by mouth., Disp: , Rfl:  .  Pyridostigmine Bromide 30 MG TABS, Take by mouth., Disp: , Rfl:  .  ranitidine (ZANTAC) 75 MG/5ML syrup, Take by mouth., Disp: , Rfl:  .  rifaximin (XIFAXAN) 550 MG TABS tablet, Take 1 tablet (550 mg total) by mouth 2 (two) times daily for 14 days., Disp: 28 tablet, Rfl: 0 .  simethicone (MYLICON) 80 MG chewable tablet, Chew 80 mg by mouth every 6 (six) hours as needed for flatulence., Disp: , Rfl:  .  Sodium Fluoride (PREVIDENT 5000 BOOSTER) 1.1 % PSTE, Place onto teeth., Disp: , Rfl:  .  TRIAMCINOLONE ACETONIDE,NASAL, NA, Place into the nose., Disp: , Rfl:  .  Turmeric, Curcuma Longa, (CURCUMIN) POWD, Take by mouth., Disp: , Rfl:  .  Vitamin D, Cholecalciferol, 10 MCG (400 UNIT) TABS, Take by mouth., Disp: , Rfl:  .  Vitamin D, Cholecalciferol, 10 MCG (400 UNIT) TABS, Take by mouth., Disp: , Rfl:  .  vitamin k 100 MCG tablet, Take 100 mcg by mouth 4 (four) times daily., Disp: , Rfl:   Review of Systems  Constitutional: Positive for activity change, chills, diaphoresis, fatigue and unexpected weight change.  HENT: Positive for congestion, dental problem, ear pain, facial swelling, postnasal drip, sinus pressure,  sore throat, tinnitus and trouble swallowing.   Eyes: Positive for photophobia, pain, itching and visual disturbance.  Respiratory: Positive for apnea, chest tightness and shortness of breath.   Cardiovascular: Positive for chest pain and palpitations.  Gastrointestinal: Positive for abdominal distention, abdominal pain, constipation, diarrhea, nausea and rectal pain.  Endocrine: Positive for heat intolerance, polydipsia and polyuria.  Genitourinary: Positive for dysuria, enuresis, frequency, urgency and vaginal pain.  Musculoskeletal: Positive for arthralgias, back pain, gait problem, myalgias, neck pain and neck stiffness.  Skin: Positive for rash.  Allergic/Immunologic: Positive for environmental allergies and food allergies.  Neurological: Positive for dizziness, tremors, facial asymmetry, speech difficulty, weakness, light-headedness, numbness and headaches.  Hematological: Bruises/bleeds easily.  Psychiatric/Behavioral: Positive for decreased concentration and sleep disturbance. The patient is nervous/anxious.   All other systems reviewed and are negative.   Social History   Tobacco Use  . Smoking status: Never Smoker  . Smokeless tobacco: Never Used  Substance Use Topics  . Alcohol use: Never    Frequency: Never      Objective:   BP 128/81 (BP Location: Left Arm, Patient Position: Sitting, Cuff Size: Normal)   Pulse 79   Temp 98.3 F (36.8 C) (Oral)   Resp 16   Ht 5\' 2"  (1.575 m)   Wt 139 lb (63 kg)   SpO2 99%   BMI 25.42 kg/m  Vitals:   10/05/18 0909  BP: 128/81  Pulse: 79  Resp: 16  Temp: 98.3 F (36.8 C)  TempSrc: Oral  SpO2: 99%  Weight: 139 lb (63 kg)  Height: 5\' 2"  (1.575 m)     Physical Exam Vitals signs reviewed.  Constitutional:  General: She is not in acute distress.    Appearance: She is not diaphoretic.  HENT:     Head:     Comments: Wearing c-collar, but removes this after she sits down in the room.  Wearing elastic band around her  jaw and head    Right Ear: Tympanic membrane, ear canal and external ear normal.     Left Ear: Tympanic membrane, ear canal and external ear normal.     Nose: Nose normal.     Mouth/Throat:     Mouth: Mucous membranes are moist.     Pharynx: Oropharynx is clear.  Eyes:     General: No scleral icterus.       Right eye: No discharge.        Left eye: No discharge.     Extraocular Movements: Extraocular movements intact.     Conjunctiva/sclera: Conjunctivae normal.     Pupils: Pupils are equal, round, and reactive to light.  Neck:     Musculoskeletal: Neck supple.  Cardiovascular:     Rate and Rhythm: Normal rate and regular rhythm.     Pulses: Normal pulses.     Heart sounds: Normal heart sounds. No murmur.  Pulmonary:     Effort: Pulmonary effort is normal. No respiratory distress.     Breath sounds: Normal breath sounds. No wheezing or rhonchi.  Abdominal:     General: There is no distension.     Palpations: Abdomen is soft.     Tenderness: There is no abdominal tenderness. There is no guarding or rebound.  Musculoskeletal:     Right lower leg: No edema.     Left lower leg: No edema.  Lymphadenopathy:     Cervical: No cervical adenopathy.  Skin:    General: Skin is warm and dry.     Capillary Refill: Capillary refill takes less than 2 seconds.     Findings: No rash.  Neurological:     Mental Status: She is alert and oriented to person, place, and time.     Cranial Nerves: No cranial nerve deficit.  Psychiatric:        Mood and Affect: Mood normal.        Thought Content: Thought content normal.         Assessment & Plan   Problem List Items Addressed This Visit      Cardiovascular and Mediastinum   Chronic migraine with aura - Primary    Followed by neurology and states she has found a new neurologist in the area She had an MRI in 06/2018 that is negative for Chiari malformation Her TMJ dysfunction may have some contributing factor to her migraines      POTS  (postural orthostatic tachycardia syndrome)    Followed by cardiology She is currently taking Mestinon She has been encouraged to increase her fluid and salt intake She is stable currently with normal vital signs during my exam        Digestive   Rectocele    Followed by pelvic floor physical therapy as well as gastroenterology Her 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 contributes to her pelvic floor dysfunction and increases her risk of rectocele, cystocele, and uterine prolapse        Nervous and Auditory   Dysautonomia ([place])    Followed by cardiology, also with pots Encourage fluid and salt intake Continue current medications        Musculoskeletal and Integument   Arthralgia of left temporomandibular joint    She is  followed by an orofacial pain specialist for this She wears an elastic band around her head to support her daughter at all times to prevent further dislocation      Ehlers-Danlos syndrome    Patient is hypermobile Ehlers-Danlos syndrome contributes to several of her other medical problems including her shoulder instability, joint pain, TMD She is followed by orthopedics, neurology, physical therapy, and and Ehlers-Danlos specialist No changes to medications today We discussed avoiding narcotic treatments as well as fluoroquinolones      History of basal cell carcinoma    Followed by dermatology Encouraged regular skin checks No concerning lesions noted today        Other   History of colon cancer    Patient is followed by GI and oncology She states that there have been concerns about a possible genetic syndrome, so she is also followed by GYN for regular pelvic ultrasounds      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[place])    Followed by allergy and immunology She is taking to H1 blockers and an H2 blocker as well as Singulair to help suppress her histamine response She is also taking cromolyn sodium and hydroxyzine She has an EpiPen available She does have an  extensive allergy list which we discussed in detail          Return in about 6 months (around 04/05/2019) for chronic disease f/u. -The patient may be followed by her PCP in [facility] for her chronic conditions  Addressed extensive list of chronic and acute medical problems today requiring extensive time in counseling and coordination of care.  Over half of this 60 minute visit were spent in counseling and coordinating care of multiple medical problems.   The entirety of the information documented in the History of Present Illness, Review of Systems and Physical Exam were personally obtained by me. Portions of this information were initially documented by Ival Bible, CMA and reviewed by me for thoroughness and accuracy.    Virginia Crews, MD, MPH [facility] 10/07/2018 1:25 PM

## 2018-10-05 NOTE — Telephone Encounter (Signed)
MyChart message from pt has been sent to Dr. Marius Ditch. She will address the antibiotics with pt when she responds.

## 2018-10-06 ENCOUNTER — Telehealth: Payer: Self-pay | Admitting: Obstetrics and Gynecology

## 2018-10-06 ENCOUNTER — Encounter: Payer: Self-pay | Admitting: *Deleted

## 2018-10-06 NOTE — Telephone Encounter (Signed)
Pt aware of neg update MyRisk results except RPS20 VUS. Pt with hx of colon cancer. Being followed.   Patient understands these results only apply to her and her children, and this is not indicative of genetic testing results of her other family members. It is recommended that her other family members have genetic testing done.  Pt also understands negative genetic testing doesn't mean she will never get any of these cancers.   Hard copy mailed to pt. F/u prn.

## 2018-10-07 ENCOUNTER — Encounter
Admission: RE | Disposition: A | Discharge status: Home / Self Care | Payer: Self-pay | Source: Home / Self Care | Attending: Gastroenterology

## 2018-10-07 ENCOUNTER — Ambulatory Visit: Payer: No Typology Code available for payment source | Admitting: Anesthesiology

## 2018-10-07 ENCOUNTER — Ambulatory Visit
Admission: RE | Admit: 2018-10-07 | Discharge: 2018-10-07 | Disposition: A | Discharge status: Home / Self Care | Payer: No Typology Code available for payment source | Attending: Gastroenterology | Admitting: Gastroenterology

## 2018-10-07 DIAGNOSIS — K219 Gastro-esophageal reflux disease without esophagitis: Secondary | ICD-10-CM | POA: Diagnosis not present

## 2018-10-07 DIAGNOSIS — Z800 Family history of malignant neoplasm of digestive organs: Secondary | ICD-10-CM | POA: Diagnosis not present

## 2018-10-07 DIAGNOSIS — Z79899 Other long term (current) drug therapy: Secondary | ICD-10-CM | POA: Diagnosis not present

## 2018-10-07 DIAGNOSIS — Z91018 Allergy to other foods: Secondary | ICD-10-CM | POA: Insufficient documentation

## 2018-10-07 DIAGNOSIS — Q7969 Other Ehlers-Danlos syndromes: Secondary | ICD-10-CM | POA: Insufficient documentation

## 2018-10-07 DIAGNOSIS — F419 Anxiety disorder, unspecified: Secondary | ICD-10-CM | POA: Insufficient documentation

## 2018-10-07 DIAGNOSIS — G901 Familial dysautonomia [Riley-Day]: Secondary | ICD-10-CM | POA: Insufficient documentation

## 2018-10-07 DIAGNOSIS — M1990 Unspecified osteoarthritis, unspecified site: Secondary | ICD-10-CM | POA: Diagnosis not present

## 2018-10-07 DIAGNOSIS — Z85828 Personal history of other malignant neoplasm of skin: Secondary | ICD-10-CM | POA: Diagnosis not present

## 2018-10-07 DIAGNOSIS — Z885 Allergy status to narcotic agent status: Secondary | ICD-10-CM | POA: Diagnosis not present

## 2018-10-07 DIAGNOSIS — Z85038 Personal history of other malignant neoplasm of large intestine: Secondary | ICD-10-CM | POA: Insufficient documentation

## 2018-10-07 DIAGNOSIS — Z9102 Food additives allergy status: Secondary | ICD-10-CM | POA: Insufficient documentation

## 2018-10-07 DIAGNOSIS — Z880 Allergy status to penicillin: Secondary | ICD-10-CM | POA: Diagnosis not present

## 2018-10-07 DIAGNOSIS — Z91012 Allergy to eggs: Secondary | ICD-10-CM | POA: Insufficient documentation

## 2018-10-07 DIAGNOSIS — D123 Benign neoplasm of transverse colon: Secondary | ICD-10-CM | POA: Insufficient documentation

## 2018-10-07 DIAGNOSIS — Z886 Allergy status to analgesic agent status: Secondary | ICD-10-CM | POA: Insufficient documentation

## 2018-10-07 DIAGNOSIS — G629 Polyneuropathy, unspecified: Secondary | ICD-10-CM | POA: Insufficient documentation

## 2018-10-07 DIAGNOSIS — K449 Diaphragmatic hernia without obstruction or gangrene: Secondary | ICD-10-CM | POA: Insufficient documentation

## 2018-10-07 DIAGNOSIS — G4730 Sleep apnea, unspecified: Secondary | ICD-10-CM | POA: Diagnosis not present

## 2018-10-07 DIAGNOSIS — Z881 Allergy status to other antibiotic agents status: Secondary | ICD-10-CM | POA: Insufficient documentation

## 2018-10-07 DIAGNOSIS — Z888 Allergy status to other drugs, medicaments and biological substances status: Secondary | ICD-10-CM | POA: Diagnosis not present

## 2018-10-07 DIAGNOSIS — D649 Anemia, unspecified: Secondary | ICD-10-CM | POA: Diagnosis not present

## 2018-10-07 DIAGNOSIS — Z980 Intestinal bypass and anastomosis status: Secondary | ICD-10-CM | POA: Insufficient documentation

## 2018-10-07 DIAGNOSIS — G608 Other hereditary and idiopathic neuropathies: Secondary | ICD-10-CM | POA: Diagnosis not present

## 2018-10-07 DIAGNOSIS — J45909 Unspecified asthma, uncomplicated: Secondary | ICD-10-CM | POA: Diagnosis not present

## 2018-10-07 DIAGNOSIS — Z08 Encounter for follow-up examination after completed treatment for malignant neoplasm: Secondary | ICD-10-CM | POA: Diagnosis present

## 2018-10-07 HISTORY — PX: COLONOSCOPY WITH PROPOFOL: SHX5780

## 2018-10-07 SURGERY — COLONOSCOPY WITH PROPOFOL
Anesthesia: General

## 2018-10-07 MED ORDER — PROPOFOL 10 MG/ML IV BOLUS
INTRAVENOUS | Status: DC | PRN
  Administered 2018-10-07: 30 mg via INTRAVENOUS

## 2018-10-07 MED ORDER — PROPOFOL 500 MG/50ML IV EMUL
INTRAVENOUS | Status: DC | PRN
  Administered 2018-10-07: 75 ug/kg/min via INTRAVENOUS

## 2018-10-07 MED ORDER — DIPHENHYDRAMINE HCL 50 MG/ML IJ SOLN
INTRAMUSCULAR | Status: DC | PRN
  Administered 2018-10-07: 25 mg via INTRAVENOUS

## 2018-10-07 MED ORDER — SODIUM CHLORIDE 0.9 % IV SOLN
INTRAVENOUS | Status: DC
  Administered 2018-10-07: 1000 mL via INTRAVENOUS

## 2018-10-07 MED ORDER — PROPOFOL 500 MG/50ML IV EMUL
INTRAVENOUS | Status: AC
  Filled 2018-10-07: qty 50

## 2018-10-07 NOTE — Anesthesia Post-op Follow-up Note (Signed)
Anesthesia QCDR form completed.        

## 2018-10-07 NOTE — Assessment & Plan Note (Signed)
Followed by dermatology Encouraged regular skin checks No concerning lesions noted today

## 2018-10-07 NOTE — Assessment & Plan Note (Signed)
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

## 2018-10-07 NOTE — Anesthesia Postprocedure Evaluation (Signed)
Anesthesia Post Note  Patient: Autumn Zhang  Procedure(s) Performed: COLONOSCOPY WITH PROPOFOL (N/A )  Patient location during evaluation: PACU Anesthesia Type: General Level of consciousness: awake and alert Pain management: pain level controlled Vital Signs Assessment: post-procedure vital signs reviewed and stable Respiratory status: spontaneous breathing, nonlabored ventilation and respiratory function stable Cardiovascular status: blood pressure returned to baseline and stable Postop Assessment: no apparent nausea or vomiting Anesthetic complications: no     Last Vitals:  Vitals:   10/07/18 1136 10/07/18 1146  BP: 91/72 100/72  Pulse: 75 72  Resp: 12 14  Temp:    SpO2: 100% 100%    Last Pain:  Vitals:   10/07/18 1146  TempSrc:   PainSc: [place]

## 2018-10-07 NOTE — Op Note (Signed)
[facility] Patient Name: Autumn Zhang Procedure Date: 10/07/2018 10:29 AM MRN: [id number] Account #: [id number] Date of Birth: 29-Jun-1971 Admit Type: Outpatient Age: 48 Room: [facility] ENDO ROOM 4 Gender: Female Note Status: Finalized Procedure:            Colonoscopy Indications:          High risk colon cancer surveillance: Personal history                        of colon cancer Providers:            Lin Landsman MD, MD Medicines:            Monitored Anesthesia Care Complications:        No immediate complications. Estimated blood loss: None. Procedure:            Pre-Anesthesia Assessment:                       - Prior to the procedure, a History and Physical was                        performed, and patient medications and allergies were                        reviewed. The patient is competent. The risks and                        benefits of the procedure and the sedation options and                        risks were discussed with the patient. All questions                        were answered and informed consent was obtained.                        Patient identification and proposed procedure were                        verified by the physician, the nurse, the                        anesthesiologist, the anesthetist and the technician in                        the pre-procedure area in the procedure room in the                        endoscopy suite. Mental Status Examination: alert and                        oriented. Airway Examination: normal oropharyngeal                        airway and neck mobility. Respiratory Examination:                        clear to auscultation. CV Examination: normal.  Prophylactic Antibiotics: The patient does not require                        prophylactic antibiotics. Prior Anticoagulants: The                        patient has taken no previous anticoagulant or          antiplatelet agents. ASA Grade Assessment: III - A                        patient with severe systemic disease. After reviewing                        the risks and benefits, the patient was deemed in                        satisfactory condition to undergo the procedure. The                        anesthesia plan was to use monitored anesthesia care                        (MAC). Immediately prior to administration of                        medications, the patient was re-assessed for adequacy                        to receive sedatives. The heart rate, respiratory rate,                        oxygen saturations, blood pressure, adequacy of                        pulmonary ventilation, and response to care were                        monitored throughout the procedure. The physical status                        of the patient was re-assessed after the procedure.                       After obtaining informed consent, the colonoscope was                        passed under direct vision. Throughout the procedure,                        the patient's blood pressure, pulse, and oxygen                        saturations were monitored continuously. The                        Colonoscope was introduced through the anus and                        advanced to the the ileocolonic anastomosis. The  colonoscopy was performed without difficulty. The                        patient tolerated the procedure well. The quality of                        the bowel preparation was evaluated using the BBPS                        [facility] Bowel Preparation Scale) with scores of: Right                        Colon = 3, Transverse Colon = 3 and Left Colon = 3                        (entire mucosa seen well with no residual staining,                        small fragments of stool or opaque liquid). The total                        BBPS score equals 9. Findings:      The perianal and  digital rectal examinations were normal. Pertinent       negatives include normal sphincter tone and no palpable rectal lesions.      There was evidence of a prior end-to-side ileo-colonic anastomosis in       the ascending colon. This was patent and was characterized by healthy       appearing mucosa. The anastomosis was traversed.      Two sessile polyps were found in the transverse colon. The polyps were       diminutive in size. These polyps were removed with a cold biopsy       forceps. Resection and retrieval were complete.      The retroflexed view of the distal rectum and anal verge was normal and       showed no anal or rectal abnormalities.      The exam was otherwise without abnormality.      The neo-terminal ileum appeared normal. Impression:           - Patent end-to-side ileo-colonic anastomosis,                        characterized by healthy appearing mucosa.                       - Two diminutive polyps in the transverse colon,                        removed with a cold biopsy forceps. Resected and                        retrieved.                       - The distal rectum and anal verge are normal on                        retroflexion view.                       -  The examination was otherwise normal.                       - The examined portion of the ileum was normal. Recommendation:       - Discharge patient to home (with spouse).                       - Resume previous diet today.                       - Continue present medications.                       - Await pathology results.                       - Repeat colonoscopy in 3 years for surveillance. Procedure Code(s):    --- Professional ---                       [phone], Colonoscopy, flexible; with biopsy, single or                        multiple Diagnosis Code(s):    --- Professional ---                       [phone], Personal history of other malignant neoplasm                        of large intestine                        Z98.0, Intestinal bypass and anastomosis status                       D12.3, Benign neoplasm of transverse colon (hepatic                        flexure or splenic flexure) CPT copyright 2018 American Medical Association. All rights reserved. The codes documented in this report are preliminary and upon coder review may  be revised to meet current compliance requirements. Dr. Ulyess Mort Lin Landsman MD, MD 10/07/2018 11:14:53 AM This report has been signed electronically. Number of Addenda: 0 Note Initiated On: 10/07/2018 10:29 AM Scope Withdrawal Time: 0 hours 9 minutes 28 seconds  Total Procedure Duration: 0 hours 13 minutes 34 seconds       [facility]

## 2018-10-07 NOTE — Assessment & Plan Note (Signed)
Followed by cardiology, also with pots Encourage fluid and salt intake Continue current medications

## 2018-10-07 NOTE — H&P (Signed)
[name], MD [address]  [place]  [place], [place] [address]  Main: [phone]  Fax: [phone] Pager: [phone]  Primary Care Physician:  Herma Carson, MD Primary Gastroenterologist:  Dr. [name]  Pre-Procedure History & Physical: HPI:  Autumn Zhang is a 48 y.o. female is here for an colonoscopy.   Past Medical History:  Diagnosis Date  . Allergy   . Anemia   . Anxiety   . Arthritis   . Asthma   . Basal cell carcinoma   . BRCA negative 09/2018   2015 [facility] at [facility]; has VUS; 1/20 MyRisk neg except RPS20 VUS; IBIS=10.8%/riskscore=12%  . Colon cancer ([place])   . Dysautonomia ([place])   . Ehlers-Danlos syndrome type III   . Family history of colon cancer   . GERD (gastroesophageal reflux disease)   . Idiopathic small fiber peripheral neuropathy   . Leiomyoma   . 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 Migraines   . Neck pain   . Occipital neuralgia   . Sleep apnea     Past Surgical History:  Procedure Laterality Date  . APPENDECTOMY     removed with colectomy  . BREAST SURGERY     reduction  . COLON SURGERY  2008   for colon cancer, ~1/3 of colon removed, primary reanastimosis  . ENDOMETRIAL ABLATION      Prior to Admission medications   Medication Sig Start Date End Date Taking? Authorizing Provider  famotidine (PEPCID) 10 MG tablet Take 10 mg by mouth 2 (two) times daily.   Yes [provider]  montelukast (SINGULAIR) 4 MG PACK Take by mouth.   Yes [provider]  acetaminophen (TYLENOL) 500 MG tablet Take 500 mg by mouth every 6 (six) hours as needed.    [provider]  albuterol (ACCUNEB) 0.63 MG/3ML nebulizer solution Take 1 ampule by nebulization every 6 (six) hours as needed for wheezing.    [provider]  albuterol (PROVENTIL HFA;VENTOLIN HFA) 108 (90 Base) MCG/ACT inhaler Inhale into the lungs. 09/19/16   [provider]  amitriptyline (ELAVIL) 10 MG tablet Take 10 mg by mouth at  bedtime.    [provider]  Ascorbic Acid (VITAMIN C) 1000 MG tablet Take 1,000 mg by mouth daily. Up to 1250/qd    [provider]  augmented betamethasone dipropionate (DIPROLENE-AF) 0.05 % ointment Apply topically 2 (two) times daily.    [provider]  betamethasone dipropionate (DIPROLENE) 0.05 % cream Apply topically.    [provider]  Blood Pressure Monitoring (BLOOD PRESSURE CUFF) MISC Take blood pressure as needed for symptoms. 06/07/18   [provider]  Boswellia Serrata (BOSWELLIA PO) Take by mouth.    [provider]  Steva Colder Extract POWD Take by mouth.    [provider]  CALCIUM CITRATE PO Take 400 mg by mouth 2 (two) times daily.     [provider]  carboxymethylcellulose (REFRESH PLUS) 0.5 % SOLN 1 drop 3 (three) times daily as needed.    [provider]  cloNIDine (CATAPRES) 0.1 MG tablet  03/03/18   [provider]  cromolyn (GASTROCROM) 100 MG/5ML solution Take by mouth 4 (four) times daily -  before meals and at bedtime.    [provider]  cycloSPORINE (RESTASIS) 0.05 % ophthalmic emulsion 1 drop 2 (two) times daily.    [provider]  diphenhydrAMINE (BENADRYL) 12.5 MG/5ML liquid Take by mouth 4 (four) times daily as needed.  [provider]  diphenhydrAMINE-Allantoin 2-0.5 % CREA Apply topically.    [provider]  EPINEPHrine 0.3 mg/0.3 mL IJ SOAJ injection INJECT ONE AUTO-INJECTOR INTRAMUSCULARLY AS NEEDED 03/09/18   [provider]  estradiol (ESTRACE) 0.1 MG/GM vaginal cream Place 1 Applicatorful vaginally 2 (two) times a week. 27/06/23   Copland, Deirdre Evener, PA-C  famotidine (PEPCID) 20 MG tablet Take by mouth.    [provider]  ferrous sulfate 220 (44 Fe) MG/5ML solution Take by mouth.    [provider]  ferrous sulfate 325 (65 FE) MG tablet Take 325 mg by mouth daily with breakfast.    [provider]  fexofenadine (ALLEGRA) 180 MG tablet Take 180 mg by mouth daily.    [provider]  guaifenesin (HUMIBID E) 400 MG TABS tablet Take 400 mg by mouth every 4 (four) hours.    [provider]  hydrOXYzine (ATARAX/VISTARIL) 50 MG tablet Take 50 mg by mouth 3 (three) times daily as needed.    [provider]  L-Lysine 500 MG CAPS Take by mouth 2 (two) times daily.    [provider]  L-Theanine 100 MG CAPS Take 200 mg by mouth.     [provider]  Lactobacillus Rhamnosus, GG, (CULTURELLE) CAPS Take by mouth.    [provider]  levocetirizine (XYZAL) 5 MG tablet Take by mouth.    [provider]  levocetirizine (XYZAL) 5 MG tablet  07/12/18   [provider]  Light Mineral Oil-Mineral Oil 0.5-0.5 % EMUL Apply to eye.    [provider]  loperamide (IMODIUM) 2 MG capsule Take by mouth as needed for diarrhea or loose stools.    [provider]  Lysine HCl 1000 MG TABS Take by mouth.    [provider]  magnesium oxide (MAG-OX) 400 MG tablet Take 400 mg by mouth daily.    [provider]  Magnesium Sulfate, Laxative, GRAN by Does not apply route.    [provider]  Melatonin 1 MG/ML LIQD Take by mouth. 10/16/17   [provider]  Melatonin 5 MG CAPS Take by mouth.    [provider]  MELATONIN SL Place under the tongue.    [provider]  montelukast (SINGULAIR) 10 MG tablet Take 10 mg by mouth at bedtime.    [provider]  pneumococcal 13-valent conjugate vaccine (PREVNAR 13) SUSP injection Inject into the muscle.    [provider]  polyethylene glycol (MIRALAX / GLYCOLAX) packet Take 17 g by mouth daily.    [provider]  pseudoephedrine (SUDAFED) 30 MG tablet Take 30 mg by mouth every 4 (four) hours as needed for congestion.    [provider]  psyllium (METAMUCIL) 58.6 % packet Take 1 packet by mouth  daily.    [provider]  Psyllium 400 MG CAPS Take by mouth.    [provider]  pyridostigmine (MESTINON) 60 MG tablet Take by mouth.    [provider]  Pyridostigmine Bromide 30 MG TABS Take by mouth.    [provider]  ranitidine (ZANTAC) 75 MG/5ML syrup Take by mouth.    [provider]  rifaximin (XIFAXAN) 550 MG TABS tablet Take 1 tablet (550 mg total) by mouth 2 (two) times daily for 14 days. 09/28/18 10/12/18  Lin Landsman, MD  simethicone (MYLICON) 80 MG chewable tablet Chew 80 mg by mouth every 6 (six) hours as needed for flatulence.    [provider]  Sodium Fluoride (PREVIDENT 5000 BOOSTER) 1.1 % PSTE Place onto teeth.    [provider]  TRIAMCINOLONE ACETONIDE,NASAL, NA Place into the nose.    [provider]  Turmeric, Lear Ng, (CURCUMIN) POWD Take by mouth.    [provider]  Vitamin D, Cholecalciferol, 10 MCG (400 UNIT) TABS Take by mouth.    [provider]  Vitamin D, Cholecalciferol, 10 MCG (400 UNIT) TABS Take by mouth.    [provider]  vitamin k 100 MCG tablet Take 100 mcg by mouth 4 (four) times daily.    [provider]    Allergies as of 09/28/2018 - Review Complete 09/28/2018  Allergen Reaction Noted  . Ioversol Anxiety, Hives, Itching, and Palpitations 12/05/2016  . Ascorbate Other (See Comments) 12/17/2017  . Ciprofloxacin Other (See Comments) 01/12/2018  . Eggs or egg-derived products Other (See Comments) 12/09/2017  . Ginger Other (See Comments) 12/17/2017  . Levofloxacin Other (See Comments) 01/12/2018  . Other Other (See Comments) 12/17/2017  . Soybean-containing drug products Other (See Comments) 12/17/2017  . Bupropion  08/11/2018  . Celebrex [celecoxib]  08/11/2018  . Codeine  08/11/2018  . Dexilant [dexlansoprazole]  08/11/2018  . Dulera [mometasone furo-formoterol fum]  08/11/2018  . Fentanyl  08/11/2018  . Fluticasone   08/11/2018  . Gabapentin  08/11/2018  . Gluten meal  08/11/2018  . Iodine  08/11/2018  . Lidocaine  08/11/2018  . Lyrica [pregabalin]  08/11/2018  . Mold extract [trichophyton]  08/11/2018  . Morphine and related  08/11/2018  . Novocain [procaine]  08/11/2018  . Penicillins  08/11/2018  . Pollen extract  08/11/2018  . Prilosec [omeprazole]  08/11/2018  . Salmeterol xinafoate  02/27/2012  . Silenor [doxepin hcl]  08/11/2018  . Tomato  08/11/2018  . Baclofen Rash 06/11/2018  . Clindamycin Other (See Comments) 09/07/2018  . Eggshell membrane (chicken)  [egg shells] Other (See Comments) 12/09/2017  . Epinephrine Palpitations 02/28/2016  . Hydrocodone Nausea Only 09/03/2018  . Ibuprofen Nausea Only 02/27/2012  . Lac bovis Diarrhea 11/13/2014  . Lanolin Other (See Comments) 12/09/2017  . Latex Other (See Comments) 09/03/2018  . Milk-related compounds Diarrhea 11/13/2014  . Wheat bran Diarrhea 11/13/2014    Family History  Problem Relation Age of Onset  . Colon cancer Father 76  . Breast cancer Maternal Aunt 50       BRCA neg  . Endometrial cancer Maternal Aunt 50  . Colon cancer Maternal Grandmother 42  . Migraines Maternal Grandmother   . Tremor Maternal Grandmother   . Leukemia Maternal Grandfather 68  . Food Allergy Maternal Grandfather   . Migraines Mother   . Glaucoma Mother   . Cataracts Mother   . Tremor Mother   . Fibromyalgia Mother   . Heart disease Mother   . Colon polyps Brother        non-cancerous    Social History   Socioeconomic History  . Marital status: Married    Spouse name: Not on file  . Number of children: 0  . Years of education: Not on file  . Highest education level: Not on file  Occupational History  . Occupation: applied for disability  Social Needs  . Financial resource strain: Not on file  . Food insecurity:    Worry: Not on file    Inability: Not on file  . Transportation needs:    Medical: Not on file    Non-medical: Not on  file  Tobacco Use  .  Smoking status: Never Smoker  . Smokeless tobacco: Never Used  Substance and Sexual Activity  . Alcohol use: Never    Frequency: Never  . Drug use: Never  . Sexual activity: Not Currently  Lifestyle  . Physical activity:    Days per week: Not on file    Minutes per session: Not on file  . Stress: Not on file  Relationships  . Social connections:    Talks on phone: Not on file    Gets together: Not on file    Attends religious service: Not on file    Active member of club or organization: Not on file    Attends meetings of clubs or organizations: Not on file    Relationship status: Not on file  . Intimate partner violence:    Fear of current or ex partner: Not on file    Emotionally abused: Not on file    Physically abused: Not on file    Forced sexual activity: Not on file  Other Topics Concern  . Not on file  Social History Narrative  . Not on file    Review of Systems: See HPI, otherwise negative ROS  Physical Exam: BP 112/84   Pulse 74   Temp (!) 96.5 F (35.8 C) (Tympanic)   Resp 20   Ht 5' 1.5" (1.562 m)   Wt 133 lb 9.6 oz (60.6 kg)   SpO2 100%   BMI 24.83 kg/m  General:   Alert,  pleasant and cooperative in NAD Head:  Normocephalic and atraumatic. Neck:  Supple; no masses or thyromegaly. Lungs:  Clear throughout to auscultation.    Heart:  Regular rate and rhythm. Abdomen:  Soft, nontender and nondistended. Normal bowel sounds, without guarding, and without rebound.   Neurologic:  Alert and  oriented x4;  grossly normal neurologically.  Impression/Plan: Autumn Zhang is here for an colonoscopy to be performed for colon cancer surveillance  Risks, benefits, limitations, and alternatives regarding  colonoscopy have been reviewed with the patient.  Questions have been answered.  All parties agreeable.   Sherri Sear, MD  10/07/2018, 10:49 AM

## 2018-10-07 NOTE — Assessment & Plan Note (Addendum)
Patient is hypermobile Ehlers-Danlos syndrome contributes to several of her other medical problems including her shoulder instability, joint pain, TMD She is followed by orthopedics, neurology, physical therapy, and and Ehlers-Danlos specialist No changes to medications today We discussed avoiding narcotic treatments as well as fluoroquinolones

## 2018-10-07 NOTE — Assessment & Plan Note (Signed)
She is followed by an orofacial pain specialist for this She wears an elastic band around her head to support her daughter at all times to prevent further dislocation

## 2018-10-07 NOTE — Assessment & Plan Note (Signed)
Followed by cardiology She is currently taking Mestinon She has been encouraged to increase her fluid and salt intake She is stable currently with normal vital signs during my exam

## 2018-10-07 NOTE — Assessment & Plan Note (Signed)
Followed by allergy and immunology She is taking to H1 blockers and an H2 blocker as well as Singulair to help suppress her histamine response She is also taking cromolyn sodium and hydroxyzine She has an EpiPen available She does have an extensive allergy list which we discussed in detail

## 2018-10-07 NOTE — Assessment & Plan Note (Signed)
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

## 2018-10-07 NOTE — Anesthesia Preprocedure Evaluation (Addendum)
Anesthesia Evaluation  Patient identified by MRN, date of birth, ID band Patient awake    Reviewed: Allergy & Precautions, H&P , NPO status , Patient's Chart, lab work & pertinent test results  Airway Mallampati: III  TM Distance: >3 FB   Mouth opening: Limited Mouth Opening  Dental  (+) Chipped   Pulmonary asthma , sleep apnea ,           Cardiovascular negative cardio ROS       Neuro/Psych  Headaches, PSYCHIATRIC DISORDERS Anxiety  Neuromuscular disease (neuropathy)    GI/Hepatic Neg liver ROS, hiatal hernia, GERD  Controlled,  Endo/Other  negative endocrine ROS  Renal/GU negative Renal ROS  negative genitourinary   Musculoskeletal  (+) Arthritis ,   Abdominal   Peds  Hematology  (+) Blood dyscrasia, anemia ,   Anesthesia Other Findings Ehlers Danlos 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, limited mouth opening Cervical spine disease  Past Medical History: No date: Allergy No date: Anemia No date: Anxiety No date: Arthritis No date: Asthma No date: Basal cell carcinoma 09/2018: BRCA negative     Comment:  2015 [place] at [facility]; has VUS; 1/20 MyRisk neg except RPS20              VUS; IBIS=10.8%/riskscore=12% No date: Colon cancer ([place]) No date: Dysautonomia ([place]) No date: Ehlers-Danlos syndrome type III No date: Family history of colon cancer No date: GERD (gastroesophageal reflux disease) No date: Idiopathic small fiber peripheral neuropathy No date: Leiomyoma No date: Mast cell activation ([facility]) No date: Migraines No date: Neck pain No date: Occipital neuralgia No date: Sleep apnea  Past Surgical History: No date: APPENDECTOMY     Comment:  removed with colectomy No date: BREAST SURGERY     Comment:  reduction 2008: COLON SURGERY     Comment:  for colon cancer, ~1/3 of colon removed, primary               reanastimosis No date: ENDOMETRIAL ABLATION  BMI    Body Mass  Index:  24.83 kg/m      Reproductive/Obstetrics negative OB ROS                            Anesthesia Physical Anesthesia Plan  ASA: III  Anesthesia Plan: General   Post-op Pain Management:    Induction:   PONV Risk Score and Plan: Propofol infusion and TIVA  Airway Management Planned:   Additional Equipment:   Intra-op Plan:   Post-operative Plan:   Informed Consent: I have reviewed the patients History and Physical, chart, labs and discussed the procedure including the risks, benefits and alternatives for the proposed anesthesia with the patient or authorized representative who has indicated his/her understanding and acceptance.     Dental Advisory Given  Plan Discussed with: Anesthesiologist, CRNA and Surgeon  Anesthesia Plan Comments: (IV Benadryl 25 mg pre-op Plan propofol only anesthetic)       Anesthesia Quick Evaluation

## 2018-10-07 NOTE — Transfer of Care (Signed)
Immediate Anesthesia Transfer of Care Note  Patient: Autumn Zhang  Procedure(s) Performed: COLONOSCOPY WITH PROPOFOL (N/A )  Patient Location: PACU and Endoscopy Unit  Anesthesia Type:General  Level of Consciousness: awake, alert  and oriented  Airway & Oxygen Therapy: Patient Spontanous Breathing  Post-op Assessment: Report given to RN and Post -op Vital signs reviewed and stable  Post vital signs: Reviewed and stable  Last Vitals:  Vitals Value Taken Time  BP    Temp    Pulse    Resp    SpO2      Last Pain:  Vitals:   10/07/18 0935  TempSrc: Tympanic  PainSc: 7          Complications: No apparent anesthesia complications

## 2018-10-07 NOTE — Assessment & Plan Note (Signed)
Patient is followed by GI and oncology She states that there have been concerns about a possible genetic syndrome, so she is also followed by GYN for regular pelvic ultrasounds

## 2018-10-08 ENCOUNTER — Encounter: Payer: No Typology Code available for payment source | Admitting: Physical Therapy

## 2018-10-08 ENCOUNTER — Encounter: Payer: Self-pay | Admitting: Gastroenterology

## 2018-10-08 LAB — SURGICAL PATHOLOGY

## 2018-10-12 ENCOUNTER — Encounter: Payer: No Typology Code available for payment source | Admitting: Physical Therapy

## 2018-10-18 ENCOUNTER — Encounter: Payer: No Typology Code available for payment source | Admitting: Physical Therapy

## 2018-10-21 ENCOUNTER — Ambulatory Visit (INDEPENDENT_AMBULATORY_CARE_PROVIDER_SITE_OTHER): Payer: No Typology Code available for payment source | Admitting: Internal Medicine

## 2018-10-21 ENCOUNTER — Encounter: Payer: Self-pay | Admitting: Internal Medicine

## 2018-10-21 VITALS — BP 110/78 | HR 88 | Ht 61.5 in | Wt 138.0 lb

## 2018-10-21 DIAGNOSIS — G901 Familial dysautonomia [Riley-Day]: Secondary | ICD-10-CM

## 2018-10-21 NOTE — Patient Instructions (Signed)
Medication Instructions:  - Your physician recommends that you continue on your current medications as directed. Please refer to the Current Medication list given to you today.  If you need a refill on your cardiac medications before your next appointment, please call your pharmacy.   Lab work: - none ordered  If you have labs (blood work) drawn today and your tests are completely normal, you will receive your results only by: . MyChart Message (if you have MyChart) OR . A paper copy in the mail If you have any lab test that is abnormal or we need to change your treatment, we will call you to review the results.  Testing/Procedures: - none ordered  Follow-Up: At [facility], you and your health needs are our priority.  As part of our continuing mission to provide you with exceptional heart care, we have created designated Provider Care Teams.  These Care Teams include your primary Cardiologist (physician) and Advanced Practice Providers (APPs -  Physician Assistants and Nurse Practitioners) who all work together to provide you with the care you need, when you need it. . as needed with Dr. Klein.  Any Other Special Instructions Will Be Listed Below (If Applicable). - N/A   

## 2018-10-21 NOTE — Progress Notes (Signed)
ELECTROPHYSIOLOGY office NOTE  Patient ID: Autumn Zhang, MRN: [id number], DOB/AGE: 1970-12-30 48 y.o. Admit date: (Not on file) Date of Consult: 10/21/2018  Primary Physician: Virginia Crews, MD Primary Cardiologist: none      Autumn Zhang is a 48 y.o. female who is being seen today for the evaluation of  Possible dysautonomia.    HPI Autumn Zhang is a 48 y.o. female with an exceedingly complex medical history. She carries a diagnosis of mast cell disorder, joint hypermobility/Ehlers-Danlos, small fiber neuropathy, cervical dystonia.  She has been seen by multiple physicians over the years and many of her symptoms were significantly attenuated by the first diagnosis and the treatment with cromolyn  She was also started on Mestinon which had a significant impact on her "hot flashes "which have resumed with less intensity following down titration of the Mestinon prompted by diastolic hypertension, blood pressures in the 80-low 100 range.  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  She also has submandibular subluxation or dislocation.  She wears a jaw strap and a cervical collar.  Long history of increased heart rate.  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  It was decreased because of hypotension.  She describes a restlessness in her legs different from her "restless leg syndrome ," this associated with low ferritin.  She denies chest pains or shortness of breath but is not very active.  No edema.      Past Medical History:  Diagnosis Date  . Allergy   . Anemia   . Anxiety   . Arthritis   . Asthma   . Basal cell carcinoma   . BRCA negative 09/2018   2015 [place] at [facility]; has VUS; 1/20 MyRisk neg except RPS20 VUS;  IBIS=10.8%/riskscore=12%  . Colon cancer ([place])   . Dysautonomia ([place])   . Ehlers-Danlos syndrome type III   . Family history of colon cancer   . GERD (gastroesophageal reflux disease)   . Hyperlipidemia   . Idiopathic small fiber peripheral neuropathy   . Leiomyoma   . 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 Migraines   . Neck pain   . Occipital neuralgia   . Sleep apnea       Surgical History:  Past Surgical History:  Procedure Laterality Date  . APPENDECTOMY     removed with colectomy  . BREAST SURGERY     reduction  . COLON SURGERY  2008   for colon cancer, ~1/3 of colon removed, primary reanastimosis  . COLONOSCOPY WITH PROPOFOL N/A 10/07/2018   Procedure: COLONOSCOPY WITH PROPOFOL;  Surgeon: Lin Landsman, MD;  Location: [facility] ENDOSCOPY;  Service: Gastroenterology;  Laterality: N/A;  . ENDOMETRIAL ABLATION       Home Meds: Current Meds  Medication Sig  . acetaminophen (TYLENOL) 500 MG tablet Take 500 mg by mouth every 6 (six) hours as needed.  Marland Kitchen albuterol (PROVENTIL HFA;VENTOLIN HFA) 108 (90 Base) MCG/ACT inhaler Inhale into the lungs.  Marland Kitchen amitriptyline (ELAVIL) 10 MG tablet Take 10 mg by mouth at bedtime.  . Ascorbic Acid (VITAMIN C) 1000 MG tablet Take 1,000 mg by mouth daily. Up to 1250/qd  . b complex vitamins capsule Take 1 capsule by mouth daily.  . betamethasone dipropionate (DIPROLENE) 0.05 % cream  Apply topically as needed.   . Blood Pressure Monitoring (BLOOD PRESSURE CUFF) MISC Take blood pressure as needed for symptoms.  Autumn Zhang (BOSWELLIA PO) Take by mouth.  Autumn Zhang Extract POWD Take by mouth.  Marland Kitchen CALCIUM CITRATE PO Take 400 mg by mouth 2 (two) times daily.   . carboxymethylcellulose (REFRESH PLUS) 0.5 % SOLN 1 drop 3 (three) times daily as needed.  . cromolyn (GASTROCROM) 100 MG/5ML solution Take by mouth 4 (four) times daily -  before meals and at bedtime.  . cycloSPORINE (RESTASIS) 0.05 % ophthalmic emulsion 1 drop 2 (two) times  daily.  . diphenhydrAMINE (BENADRYL) 12.5 MG/5ML liquid Take by mouth 4 (four) times daily as needed.  . diphenhydrAMINE-Allantoin 2-0.5 % CREA Apply topically.  Marland Kitchen EMGALITY 120 MG/ML SOSY every 30 (thirty) days.  Marland Kitchen EPINEPHrine 0.3 mg/0.3 mL IJ SOAJ injection INJECT ONE AUTO-INJECTOR INTRAMUSCULARLY AS NEEDED  . estradiol (ESTRACE) 0.1 MG/GM vaginal cream Place 1 Applicatorful vaginally 2 (two) times a week.  . famotidine (PEPCID) 10 MG tablet Take 10 mg by mouth 2 (two) times daily.  . ferrous sulfate 325 (65 FE) MG tablet Take 325 mg by mouth daily with breakfast.  . fexofenadine (ALLEGRA) 180 MG tablet Take 180 mg by mouth daily.  . Flaxseed, Linseed, (FLAXSEED OIL PO) Take 7,640 mg by mouth daily.  Marland Kitchen guaifenesin (HUMIBID E) 400 MG TABS tablet Take 400 mg by mouth daily.   . hydrOXYzine (ATARAX/VISTARIL) 50 MG tablet Take 50 mg by mouth daily.   Marland Kitchen L-Lysine 500 MG CAPS Take by mouth 2 (two) times daily.  Marland Kitchen L-Theanine 100 MG CAPS Take 200 mg by mouth.   . levocetirizine (XYZAL) 5 MG tablet Take 5 mg by mouth 2 (two) times daily.   Sunday Corn Mineral Oil-Mineral Oil 0.5-0.5 % EMUL Apply to eye.  . loperamide (IMODIUM) 2 MG capsule Take by mouth as needed for diarrhea or loose stools.  . magnesium oxide (MAG-OX) 400 MG tablet Take 400 mg by mouth daily.  . Melatonin 1 MG/ML LIQD Take by mouth daily.   . Melatonin 5 MG CAPS Take by mouth.  . montelukast (SINGULAIR) 10 MG tablet Take 10 mg by mouth at bedtime.  . pneumococcal 13-valent conjugate vaccine (PREVNAR 13) SUSP injection Inject into the muscle.  . polyethylene glycol (MIRALAX / GLYCOLAX) packet Take 17 g by mouth daily as needed.   . pseudoephedrine (SUDAFED) 30 MG tablet Take 30 mg by mouth every 4 (four) hours as needed for congestion.  . Psyllium 400 MG CAPS Take by mouth as needed.   . pyridostigmine (MESTINON) 60 MG tablet Take by mouth. Pt cuts tablet in quarters and takes 15 mg tablet BID.  . rizatriptan (MAXALT) 10 MG tablet as  needed.  . simethicone (MYLICON) 80 MG chewable tablet Chew 80 mg by mouth every 6 (six) hours as needed for flatulence.  . Sodium Fluoride (PREVIDENT 5000 BOOSTER) 1.1 % PSTE Place onto teeth.  . TRIAMCINOLONE ACETONIDE,NASAL, NA Place into the nose as needed.   . Vitamin D, Cholecalciferol, 10 MCG (400 UNIT) TABS Take by mouth daily.   . vitamin k 100 MCG tablet Take 100 mcg by mouth 4 (four) times daily.    Allergies:  Allergies  Allergen Reactions  . Ioversol Anxiety, Hives, Itching and Palpitations  . Ciprofloxacin Other (See Comments)  . Eggs Or Egg-Derived Products Other (See Comments)    Exacerbates neuralgia symptoms Exacerbates neuralgia symptoms Congestion & Headaches Exacerbates neuralgia symptoms Congestion &  Headaches   . Ginger Other (See Comments)    Congestion & Headaches Congestion & Headaches   . Levofloxacin Other (See Comments)    Congestion & Headaches Congestion & Headaches   . Other Other (See Comments)    Novocaine (IV) form Hearing loss Novocaine (IV) form Hearing loss  Congestion & Headaches Congestion & Headaches Congestion & Headaches Congestion & Headaches   . Soybean-Containing Drug Products Other (See Comments)    Congestion & Headaches   . Bupropion   . Celebrex [Celecoxib]   . Codeine   . Dexilant [Dexlansoprazole]   . Dulera [Mometasone Furo-Formoterol Fum]   . Fentanyl   . Fluticasone   . Gabapentin   . Gluten Meal   . Iodine   . Lidocaine     Rash to topical cream  . Lyrica [Pregabalin]   . Mold Extract [Trichophyton]   . Morphine And Related   . Penicillins     Skin testing positive for allergy  . Pollen Extract   . Prilosec [Omeprazole]   . Salmeterol Xinafoate   . Silenor [Doxepin Hcl]   . Tomato   . Hydrocodone Nausea Only  . Ibuprofen Nausea Only    At high doses At high doses   . Lac [place] Diarrhea    Facial burning sensation Facial burning sensation Facial burning sensation Facial burning sensation     . Lanolin Other (See Comments)    Exacerbates neuralgia symptoms Wool causes hives.   . Latex Other (See Comments)    Other Reaction: Other reaction "sensitivity" Other Reaction: Other reaction "sensitivity"   . Milk-Related Compounds Diarrhea    Facial burning sensation   . Wheat Bran Diarrhea    Social History   Socioeconomic History  . Marital status: Married    Spouse name: Not on file  . Number of children: 0  . Years of education: Not on file  . Highest education level: Not on file  Occupational History  . Occupation: applied for disability  Social Needs  . Financial resource strain: Not on file  . Food insecurity:    Worry: Not on file    Inability: Not on file  . Transportation needs:    Medical: Not on file    Non-medical: Not on file  Tobacco Use  . Smoking status: Never Smoker  . Smokeless tobacco: Never Used  Substance and Sexual Activity  . Alcohol use: Never    Frequency: Never  . Drug use: Never  . Sexual activity: Not Currently  Lifestyle  . Physical activity:    Days per week: Not on file    Minutes per session: Not on file  . Stress: Not on file  Relationships  . Social connections:    Talks on phone: Not on file    Gets together: Not on file    Attends religious service: Not on file    Active member of club or organization: Not on file    Attends meetings of clubs or organizations: Not on file    Relationship status: Not on file  . Intimate partner violence:    Fear of current or ex partner: Not on file    Emotionally abused: Not on file    Physically abused: Not on file    Forced sexual activity: Not on file  Other Topics Concern  . Not on file  Social History Narrative  . Not on file     Family History  Problem Relation Age of Onset  . Colon cancer  Father 91  . Breast cancer Maternal Aunt 50       BRCA neg  . Endometrial cancer Maternal Aunt 50  . Colon cancer Maternal Grandmother 40  . Migraines Maternal Grandmother   .  Tremor Maternal Grandmother   . Leukemia Maternal Grandfather 8  . Food Allergy Maternal Grandfather   . Migraines Mother   . Glaucoma Mother   . Cataracts Mother   . Tremor Mother   . Fibromyalgia Mother   . Heart disease Mother   . Colon polyps Brother        non-cancerous     ROS:  Please see the history of present illness.     All other systems reviewed and negative.    Physical Exam: Blood pressure 110/78, pulse 88, height 5' 1.5" (1.562 m), weight 138 lb (62.6 kg). General: Well developed, well nourished female in no acute distress. Head: Normocephalic, atraumatic, sclera non-icteric, no xanthomas, nares are without discharge. EENT: normal  Lymph Nodes:  none Neck: Negative for carotid bruits. JVD not elevated. Back:without scoliosis kyphosis  Lungs: Clear bilaterally to auscultation without wheezes, rales, or rhonchi. Breathing is unlabored. Heart: RRR with S1 S2. No  murmur . No rubs, or gallops appreciated. Abdomen: Soft, non-tender, non-distended with normoactive bowel sounds. No hepatomegaly. No rebound/guarding. No obvious abdominal masses. Msk:  Strength and tone appear normal for age.  Joint stability was not assessed. As noted previously she is wearing a mandibular strap and a cervical collar Extremities: No clubbing or cyanosis. No edema.  Distal pedal pulses are 2+ and equal bilaterally. Skin: Warm and Dry Neuro: Alert and oriented X 3. CN III-XII intact Grossly normal sensory and motor function . Psych:  Responds to questions appropriately with a normal affect.      Labs: Cardiac Enzymes No results for input(s): CKTOTAL, CKMB, TROPONINI in the last 72 hours. CBC Lab Results  Component Value Date   WBC 8.2 09/02/2018   HGB 13.8 09/02/2018   HCT 43.2 09/02/2018   MCV 91.1 09/02/2018   PLT 370 09/02/2018   PROTIME: No results for input(s): LABPROT, INR in the last 72 hours. Chemistry No results for input(s): NA, K, CL, CO2, BUN, CREATININE, CALCIUM,  PROT, BILITOT, ALKPHOS, ALT, AST, GLUCOSE in the last 168 hours.  Invalid input(s): LABALBU Lipids No results found for: CHOL, HDL, LDLCALC, TRIG BNP No results found for: PROBNP Thyroid Function Tests: No results for input(s): TSH, T4TOTAL, T3FREE, THYROIDAB in the last 72 hours.  Invalid input(s): FREET3 Miscellaneous No results found for: DDIMER  Radiology/Studies:  No results found.  EKG: Sinus rhythm at 88 Intervals 15/06/34   Assessment and Plan:  Mast cell activation disorder  Joint hypermobility/EDS  Small fiber neuropathy  Cervical dystonia  Obviously very complicated history.  Given the benefits of Mestinon as relates to her hot flash spells, suggested she try to uptitrated gently and see if she can find a dose that does not result in diastolic hypertension.  In the big picture however, I do not think that I offer much for her care.  I suggested that she consider a evaluation at [facility] where they multiple specialties can be brought to bear to see if there is a unifying diagnosis          Virl Axe

## 2018-10-22 ENCOUNTER — Encounter: Payer: Self-pay | Admitting: Gastroenterology

## 2018-10-25 ENCOUNTER — Other Ambulatory Visit: Payer: Self-pay | Admitting: Obstetrics and Gynecology

## 2018-10-25 DIAGNOSIS — N898 Other specified noninflammatory disorders of vagina: Secondary | ICD-10-CM

## 2018-10-25 MED ORDER — ESTRADIOL 0.1 MG/GM VA CREA: 1.0000 | TOPICAL_CREAM | VAGINAL | 2 refills | Status: DC

## 2018-10-25 NOTE — Progress Notes (Signed)
Rx estrace crm to [facility], not local [facility] as originally eR[facility].

## 2018-10-26 ENCOUNTER — Encounter: Payer: No Typology Code available for payment source | Admitting: Physical Therapy

## 2018-11-02 ENCOUNTER — Ambulatory Visit (INDEPENDENT_AMBULATORY_CARE_PROVIDER_SITE_OTHER): Payer: No Typology Code available for payment source | Admitting: Gastroenterology

## 2018-11-02 ENCOUNTER — Other Ambulatory Visit: Payer: Self-pay

## 2018-11-02 ENCOUNTER — Encounter: Payer: No Typology Code available for payment source | Admitting: Physical Therapy

## 2018-11-02 ENCOUNTER — Encounter: Payer: Self-pay | Admitting: Gastroenterology

## 2018-11-02 VITALS — BP 107/76 | HR 92 | Resp 17 | Ht 61.5 in | Wt 140.2 lb

## 2018-11-02 DIAGNOSIS — R109 Unspecified abdominal pain: Secondary | ICD-10-CM | POA: Diagnosis not present

## 2018-11-02 NOTE — Progress Notes (Signed)
[name], MD [address]  [place]  [place],  [address]  Main: [phone]  Fax: [phone]    Gastroenterology Consultation  Referring Provider:     Herma Carson, MD Primary Care Physician:  Herma Carson, MD Primary Gastroenterologist:  Dr. [name] Reason for Consultation:     Pelvic floor dyssynergia, abdominal bloating, abdominal pain        HPI:   Autumn Zhang is a 48 y.o. Caucasian female referred by Dr. Herma Carson, MD  for consultation & management of constellation of upper and lower GI symptoms.  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  She underwent right hemicolectomy which revealed poorly differentiated invasive adenocarcinoma T3N0. She received 12 cycles of adjuvant chemotherapy with 5-FU leucovorin. 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 her father died of colon cancer at the age of 65.  Her only brother has also had colon polyps.  She has therefore been managed as a Lynch syndrome patient despite not having genetic features suggestive of Lynch syndrome.  She took prophylactic aspirin for a while and then she stopped taking it.  She has been getting colonoscopies every 1 to 2 years.  Her last colonoscopy was in 2018 at outside hospital.  Patient has history of Ehlers-Danlos syndrome.  Currently, patient is going through migraine attack and waiting glasses and he hat.  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  Moved from [place] to [place] and here to establish care for her ongoing GI symptoms.  Previously, she saw Dr. Carlton Adam at [facility], Dr Heath Lark at [facility] in [place].  Her GI symptoms include pelvic floor dyssynergia, chronic constipation although having several bowel movements daily with incomplete emptying, nonbloody, abdominal bloating.  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  She also underwent smart pill study at [facility] in 08/2018 which revealed normal small bowel transit time.  She underwent pelvic floor PT in [place] and did not find it helpful.  Her most concerning symptom today is frequent soft bowel movements anywhere from 5 to 6/day, nonbloody associated with significant abdominal bloating.  She was told that she had considered based on the imaging studies and therefore tried linaclotide in the past and did not tolerate it well.   She has history of heartburn, epigastric pain, regurgitation which are manageable.  Her weight has been stable.  She underwent nuclear medicine gastric emptying study which was normal in 03/2018.  Patient is also here to discuss about surveillance colonoscopy given her history of colon cancer and treating as Lynch syndrome.  She reports undergoing surveillance EGD in 2016 which was reportedly normal.  Patient reports that she has history of iron deficiency anemia but her most recent labs returned unremarkable including normal iron studies.  She takes iron, B12, vitamin C, calcium and vitamin D.  She has history of mast cell activation syndrome for which she takes cromolyn sodium and scheduled to see allergy immunology at [facility].  Patient reports that she has several intolerances and allergies to medications and is very careful about trying new medication.  Follow-up visit 11/02/2018 She underwent colonoscopy which revealed diminutive tubular adenoma only.  Otherwise it was unremarkable.  Today, she wanted to discuss about chronic left upper  abdominal pain that she has been dealing with for last 2 to 3 years.  She sees eating fatty foods triggers the pain, sometimes laying on the left side makes the pain worse.  She states that is mild discomfort, nagging, more of an annoyance than anything.  The pain  does not interfere with her daily life.  She had cross-sectional imaging which was unremarkable.  She also had EGD which was unremarkable.  She would like to know if there is anything that she could try.  She tried Amitiza 8 MCG 2 times a day which resulted in constipation.  She is taking Dulcolax as needed.  She brings in with her a stool diary where she reports that her stools are variable consistency, sometimes dark, restarted oral iron.  Her weight has been stable.  She is currently on amitriptyline 10 mg at bedtime.  She would like to know if she has fat malabsorption because she notices that sometimes her stools float in the toilet bowl and she has significant fat craving.  She said she had pancreatic fecal elastase done in the past and it was negative.  She does not smoke or drink alcohol  NSAIDs: None  Antiplts/Anticoagulants/Anti thrombotics: None  GI Procedures:  Colonoscopy in 2018 by Dr Gala Lewandowsky   Colonoscopy 10/07/2018 - Patent end-to-side ileo-colonic anastomosis, characterized by healthy appearing mucosa. - Two diminutive polyps in the transverse colon, removed with a cold biopsy forceps. Resected and retrieved. - The distal rectum and anal verge are normal on retroflexion view. - The examination was otherwise normal. - The examined portion of the ileum was normal.  DIAGNOSIS:  A. COLON POLYP X2, TRANSVERSE; COLD BIOPSY:  - TUBULAR ADENOMA (1).  - COLONIC MUCOSA WITH FOCAL LYMPHOID AGGREGATE (1).  - NEGATIVE FOR HIGH-GRADE DYSPLASIA AND MALIGNANCY.   Past Medical History:  Diagnosis Date  . Allergy   . Anemia   . Anxiety   . Arthritis   . Asthma   . Basal cell carcinoma   . BRCA negative 09/2018   2015 [place] at [facility]; has VUS; 1/20 MyRisk neg except RPS20 VUS; IBIS=10.8%/riskscore=12%  . Colon cancer ([place])   . Dysautonomia ([place])   . Ehlers-Danlos syndrome type III   . Family history of colon cancer   . GERD (gastroesophageal reflux disease)   . Hyperlipidemia   .  Idiopathic small fiber peripheral neuropathy   . Leiomyoma   . Mast cell activation ([place])   . Migraines   . Neck pain   . Occipital neuralgia   . Sleep apnea     Past Surgical History:  Procedure Laterality Date  . APPENDECTOMY     removed with colectomy  . BREAST SURGERY     reduction  . COLON SURGERY  2008   for colon cancer, ~1/3 of colon removed, primary reanastimosis  . COLONOSCOPY WITH PROPOFOL N/A 10/07/2018   Procedure: COLONOSCOPY WITH PROPOFOL;  Surgeon: Lin Landsman, MD;  Location: [facility] ENDOSCOPY;  Service: Gastroenterology;  Laterality: N/A;  . ENDOMETRIAL ABLATION      Current Outpatient Medications:  .  acetaminophen (TYLENOL) 500 MG tablet, Take 500 mg by mouth every 6 (six) hours as needed., Disp: , Rfl:  .  albuterol (PROVENTIL HFA;VENTOLIN HFA) 108 (90 Base) MCG/ACT inhaler, Inhale into the lungs., Disp: , Rfl:  .  amitriptyline (ELAVIL) 10 MG tablet, Take 10 mg by mouth at bedtime., Disp: , Rfl:  .  Ascorbic Acid (VITAMIN C) 1000 MG tablet, Take 1,000 mg by mouth daily.  Up to 1250/qd, Disp: , Rfl:  .  augmented betamethasone dipropionate (DIPROLENE-AF) 0.05 % cream, APPLY TWICE DAILY AS NEEDED TO THE AFFECTED AREAS FOR 5-7 DAYS FOR ECZEMA., Disp: , Rfl:  .  b complex vitamins capsule, Take 1 capsule by mouth daily., Disp: , Rfl:  .  betamethasone dipropionate (DIPROLENE) 0.05 % cream, Apply topically as needed. , Disp: , Rfl:  .  Blood Pressure Monitoring (BLOOD PRESSURE CUFF) MISC, Take blood pressure as needed for symptoms., Disp: , Rfl:  .  Boswellia Serrata (BOSWELLIA PO), Take by mouth., Disp: , Rfl:  .  Boswellia Serrata Extract POWD, Take by mouth., Disp: , Rfl:  .  CALCIUM CITRATE PO, Take 400 mg by mouth 2 (two) times daily. , Disp: , Rfl:  .  cromolyn (GASTROCROM) 100 MG/5ML solution, Take by mouth 4 (four) times daily -  before meals and at bedtime., Disp: , Rfl:  .  cycloSPORINE (RESTASIS) 0.05 % ophthalmic emulsion, 1 drop 2 (two) times daily.,  Disp: , Rfl:  .  diphenhydrAMINE (BENADRYL) 12.5 MG/5ML liquid, Take by mouth 4 (four) times daily as needed., Disp: , Rfl:  .  diphenhydrAMINE-Allantoin 2-0.5 % CREA, Apply topically., Disp: , Rfl:  .  EMGALITY 120 MG/ML SOSY, every 30 (thirty) days., Disp: , Rfl:  .  EPINEPHrine 0.3 mg/0.3 mL IJ SOAJ injection, INJECT ONE AUTO-INJECTOR INTRAMUSCULARLY AS NEEDED, Disp: , Rfl:  .  estradiol (ESTRACE) 0.1 MG/GM vaginal cream, Place 1 Applicatorful vaginally once a week., Disp: 42.5 g, Rfl: 2 .  famotidine (PEPCID) 10 MG tablet, Take 10 mg by mouth 2 (two) times daily., Disp: , Rfl:  .  ferrous sulfate 325 (65 FE) MG tablet, Take 325 mg by mouth daily with breakfast., Disp: , Rfl:  .  fexofenadine (ALLEGRA) 180 MG tablet, Take 180 mg by mouth daily., Disp: , Rfl:  .  Flaxseed, Linseed, (FLAXSEED OIL PO), Take 7,640 mg by mouth daily., Disp: , Rfl:  .  guaifenesin (HUMIBID E) 400 MG TABS tablet, Take 400 mg by mouth daily. , Disp: , Rfl:  .  hydrOXYzine (ATARAX/VISTARIL) 50 MG tablet, Take 50 mg by mouth daily. , Disp: , Rfl:  .  ketotifen (ZADITOR) 0.025 % ophthalmic solution, Administer 1 drop to both eyes Two (2) times a day. And also take 4 drops ORALLY up to 4 times daily as needed., Disp: , Rfl:  .  L-Lysine 500 MG CAPS, Take by mouth 2 (two) times daily., Disp: , Rfl:  .  L-Theanine 100 MG CAPS, Take 200 mg by mouth. , Disp: , Rfl:  .  levocetirizine (XYZAL) 5 MG tablet, Take 5 mg by mouth 2 (two) times daily. , Disp: , Rfl:  .  Light Mineral Oil-Mineral Oil 0.5-0.5 % EMUL, Apply to eye., Disp: , Rfl:  .  loperamide (IMODIUM) 2 MG capsule, Take by mouth as needed for diarrhea or loose stools., Disp: , Rfl:  .  Lysine HCl 1000 MG TABS, Take by mouth., Disp: , Rfl:  .  magnesium oxide (MAG-OX) 400 MG tablet, Take 400 mg by mouth daily., Disp: , Rfl:  .  Melatonin 1 MG/ML LIQD, Take by mouth daily. , Disp: , Rfl:  .  montelukast (SINGULAIR) 10 MG tablet, Take 10 mg by mouth at bedtime., Disp: ,  Rfl:  .  polyethylene glycol (MIRALAX / GLYCOLAX) packet, Take 17 g by mouth daily as needed. , Disp: , Rfl:  .  pseudoephedrine (SUDAFED) 30 MG tablet, Take 30 mg by mouth  every 4 (four) hours as needed for congestion., Disp: , Rfl:  .  Psyllium 400 MG CAPS, Take by mouth as needed. , Disp: , Rfl:  .  pyridostigmine (MESTINON) 60 MG tablet, Take by mouth. Pt cuts tablet in quarters and takes 15 mg tablet BID., Disp: , Rfl:  .  simethicone (MYLICON) 80 MG chewable tablet, Chew 80 mg by mouth every 6 (six) hours as needed for flatulence., Disp: , Rfl:  .  Sodium Fluoride (PREVIDENT 5000 BOOSTER) 1.1 % PSTE, Place onto teeth., Disp: , Rfl:  .  TRIAMCINOLONE ACETONIDE,NASAL, NA, Place into the nose as needed. , Disp: , Rfl:  .  Vitamin D, Cholecalciferol, 10 MCG (400 UNIT) TABS, Take by mouth daily. , Disp: , Rfl:  .  vitamin k 100 MCG tablet, Take 100 mcg by mouth 4 (four) times daily., Disp: , Rfl:  .  carboxymethylcellulose (REFRESH PLUS) 0.5 % SOLN, 1 drop 3 (three) times daily as needed., Disp: , Rfl:  .  Flaxseed Oil (LINSEED OIL) OIL, Take by mouth., Disp: , Rfl:  .  Melatonin 5 MG CAPS, Take by mouth., Disp: , Rfl:  .  pneumococcal 13-valent conjugate vaccine (PREVNAR 13) SUSP injection, Inject into the muscle., Disp: , Rfl:  .  rizatriptan (MAXALT) 10 MG tablet, as needed., Disp: , Rfl:    Family History  Problem Relation Age of Onset  . Colon cancer Father 3  . Breast cancer Maternal Aunt 50       BRCA neg  . Endometrial cancer Maternal Aunt 50  . Colon cancer Maternal Grandmother 63  . Migraines Maternal Grandmother   . Tremor Maternal Grandmother   . Leukemia Maternal Grandfather 24  . Food Allergy Maternal Grandfather   . Migraines Mother   . Glaucoma Mother   . Cataracts Mother   . Tremor Mother   . Fibromyalgia Mother   . Heart disease Mother   . Colon polyps Brother        non-cancerous     Social History   Tobacco Use  . Smoking status: Never Smoker  .  Smokeless tobacco: Never Used  Substance Use Topics  . Alcohol use: Never    Frequency: Never  . Drug use: Never    Allergies as of 11/02/2018 - Review Complete 11/02/2018  Allergen Reaction Noted  . Ioversol Anxiety, Hives, Itching, and Palpitations 12/05/2016  . Ciprofloxacin Other (See Comments) 01/12/2018  . Eggs or egg-derived products Other (See Comments) 12/09/2017  . Ginger Other (See Comments) 12/17/2017  . Levofloxacin Other (See Comments) 01/12/2018  . Other Other (See Comments) 12/17/2017  . Soybean-containing drug products Other (See Comments) 12/17/2017  . Bee pollen  08/11/2018  . Bupropion  08/11/2018  . Celebrex [celecoxib]  08/11/2018  . Codeine  08/11/2018  . Dexilant [dexlansoprazole]  08/11/2018  . Dulera [mometasone furo-formoterol fum]  08/11/2018  . Fentanyl  08/11/2018  . Fluticasone  08/11/2018  . Gabapentin  08/11/2018  . Gluten meal  08/11/2018  . Iodine  08/11/2018  . Lidocaine  08/11/2018  . Lyrica [pregabalin]  08/11/2018  . Mold extract [trichophyton]  08/11/2018  . Morphine and related  08/11/2018  . Penicillins  08/11/2018  . Pollen extract  08/11/2018  . Prilosec [omeprazole]  08/11/2018  . Salmeterol xinafoate  02/27/2012  . Silenor [doxepin hcl]  08/11/2018  . Tomato  08/11/2018  . Hydrocodone Nausea Only 09/03/2018  . Ibuprofen Nausea Only 02/27/2012  . Lac bovis Diarrhea 11/13/2014  . Lanolin Other (See  Comments) 12/09/2017  . Latex Other (See Comments) 09/03/2018  . Milk-related compounds Diarrhea 11/13/2014  . Wheat bran Diarrhea 11/13/2014    Review of Systems:    All systems reviewed and negative except where noted in HPI.   Physical Exam:  BP 107/76 (BP Location: Left Arm, Patient Position: Sitting, Cuff Size: Normal)   Pulse 92   Resp 17   Ht 5' 1.5" (1.562 m)   Wt 140 lb 3.2 oz (63.6 kg)   BMI 26.06 kg/m  No LMP recorded. Patient is postmenopausal.  General:   Alert,  Well-developed, well-nourished, pleasant and  cooperative in NAD Head:  Normocephalic and atraumatic. Eyes:  Sclera clear, no icterus.   Conjunctiva pink. Ears:  Normal auditory acuity. Nose:  No deformity, discharge, or lesions. Mouth:  No deformity or lesions,oropharynx pink & moist. Neck:  Supple; no masses or thyromegaly. Lungs:  Respirations even and unlabored.  Clear throughout to auscultation.   No wheezes, crackles, or rhonchi. No acute distress. Heart:  Regular rate and rhythm; no murmurs, clicks, rubs, or gallops. Abdomen:  Normal bowel sounds. Soft, non-tender and non-distended, faint midline vertical scar without masses, hepatosplenomegaly or hernias noted.  No guarding or rebound tenderness.   Rectal: Not performed Msk:  Symmetrical without gross deformities. Good, equal movement & strength bilaterally. Pulses:  Normal pulses noted. Extremities:  No clubbing or edema.  No cyanosis. Neurologic:  Alert and oriented x3;  grossly normal neurologically. Skin:  Intact without significant lesions or rashes. No jaundice. Psych:  Alert and cooperative. Normal mood and affect.  Imaging Studies: Reviewed  Assessment and Plan:   Autumn Zhang is a 48 y.o. Caucasian female with history of colon cancer in 2009 status post right hemicolectomy with primary anastomosis, neoadjuvant chemo, in remission, mast cell activation syndrome, mild pelvic floor dysfunction with mild rectocele seen for follow-up of pelvic floor dysfunction, increased bowel frequency, abdominal bloating, chronic left-sided upper abdominal pain    Left-sided upper abdominal pain, mild chronic Most likely functional abdominal pain She had cross-sectional imaging which was unremarkable, EGD and colonoscopy unremarkable Suggested her to increase amitriptyline to 20 mg at bedtime if she is able to tolerate She did not tolerate Amitiza, would like to try Trulance, samples provided I also suggested her to try pancreatic enzymes, Zenpep samples provided  Pelvic  floor dysfunction resulting in retained stool Patient has several bowel movements, with incomplete emptying and abdominal bloating Continue pelvic floor biofeedback Trial of Trulance  Tubular adenoma: Recommend surveillance colonoscopy in 3 years   Follow up in 3 to 4 months   [name], MD

## 2018-11-04 ENCOUNTER — Inpatient Hospital Stay (HOSPITAL_BASED_OUTPATIENT_CLINIC_OR_DEPARTMENT_OTHER): Payer: No Typology Code available for payment source | Admitting: Oncology

## 2018-11-04 ENCOUNTER — Inpatient Hospital Stay: Payer: No Typology Code available for payment source | Attending: Oncology

## 2018-11-04 ENCOUNTER — Other Ambulatory Visit: Payer: Self-pay

## 2018-11-04 VITALS — BP 112/77 | HR 84 | Temp 98.0°F | Resp 18 | Wt 140.0 lb

## 2018-11-04 DIAGNOSIS — I498 Other specified cardiac arrhythmias: Secondary | ICD-10-CM | POA: Diagnosis not present

## 2018-11-04 DIAGNOSIS — D509 Iron deficiency anemia, unspecified: Secondary | ICD-10-CM

## 2018-11-04 DIAGNOSIS — Z85038 Personal history of other malignant neoplasm of large intestine: Secondary | ICD-10-CM

## 2018-11-04 DIAGNOSIS — G901 Familial dysautonomia [Riley-Day]: Secondary | ICD-10-CM

## 2018-11-04 DIAGNOSIS — Z85828 Personal history of other malignant neoplasm of skin: Secondary | ICD-10-CM

## 2018-11-04 DIAGNOSIS — Z862 Personal history of diseases of the blood and blood-forming organs and certain disorders involving the immune mechanism: Secondary | ICD-10-CM | POA: Insufficient documentation

## 2018-11-04 DIAGNOSIS — G43909 Migraine, unspecified, not intractable, without status migrainosus: Secondary | ICD-10-CM

## 2018-11-04 DIAGNOSIS — D8940 Mast cell activation, unspecified: Secondary | ICD-10-CM

## 2018-11-04 DIAGNOSIS — E611 Iron deficiency: Secondary | ICD-10-CM

## 2018-11-04 LAB — CBC WITH DIFFERENTIAL/PLATELET
Abs Immature Granulocytes: 0.02 10*3/uL (ref 0.00–0.07)
Basophils Absolute: 0.1 10*3/uL (ref 0.0–0.1)
Basophils Relative: 1 %
Eosinophils Absolute: 0.1 10*3/uL (ref 0.0–0.5)
Eosinophils Relative: 1 %
HCT: 38.6 % (ref 36.0–46.0)
Hemoglobin: 12.5 g/dL (ref 12.0–15.0)
Immature Granulocytes: 0 %
Lymphocytes Relative: 19 %
Lymphs Abs: 1.3 10*3/uL (ref 0.7–4.0)
MCH: 29.6 pg (ref 26.0–34.0)
MCHC: 32.4 g/dL (ref 30.0–36.0)
MCV: 91.5 fL (ref 80.0–100.0)
Monocytes Absolute: 0.5 10*3/uL (ref 0.1–1.0)
Monocytes Relative: 7 %
Neutro Abs: 4.8 10*3/uL (ref 1.7–7.7)
Neutrophils Relative %: 72 %
Platelets: 304 10*3/uL (ref 150–400)
RBC: 4.22 MIL/uL (ref 3.87–5.11)
RDW: 12.5 % (ref 11.5–15.5)
WBC: 6.8 10*3/uL (ref 4.0–10.5)
nRBC: 0 % (ref 0.0–0.2)

## 2018-11-04 LAB — IRON AND TIBC
Iron: 71 ug/dL (ref 28–170)
Saturation Ratios: 19 % (ref 10.4–31.8)
TIBC: 383 ug/dL (ref 250–450)
UIBC: 312 ug/dL

## 2018-11-04 LAB — FERRITIN: Ferritin: 39 ng/mL (ref 11–307)

## 2018-11-04 NOTE — Progress Notes (Signed)
Pt here for follow up . Feeling fatigued,has 24/7 migraine continues, all over joint pain esp in R shoulder

## 2018-11-05 ENCOUNTER — Encounter: Payer: Self-pay | Admitting: Oncology

## 2018-11-05 NOTE — Progress Notes (Signed)
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  She underwent right hemicolectomy which revealed poorly differentiated invasive adenocarcinoma T3N0.  0 out of 34 lymph nodes were positive.  She received 12 cycles of adjuvant chemotherapy with 5-FU leucovorin.  No oxaliplatin.  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 her father died of colon cancer at the age of 7.  Her only brother has also had colon polyps.  She has therefore been managed as a Lynch syndrome patient despite not having genetic features suggestive of Lynch syndrome.  She took prophylactic aspirin for a while and then she stopped taking it.  She has been getting colonoscopies every 1 to 2 years.  She has also had capsule endoscopy in 2014 for iron deficiency anemia that was normal.  She has not required any surveillance scans as she has been more than 5 years since her diagnosis of colon cancer. She has established care with Dr. Marius Ditch for this.  Other than that patient has a history of basal cell skin cancer x2 and is in the process of finding a dermatologist.  She was also getting yearly pelvic exams and pelvic ultrasound to screen for GYN cancers.  Patient has a history of mast cell activation syndrome and often needs Benadryl before taking oral iron.  Also reports that she has dysautonomia and pots for which she sees cardiology. Also has a h/o Ehler Danlos  syndrome  Currently patient has an ongoing migraine attack.  She is wearing dark glasses and a cat and unable to see eye to eye.  She denies any blood in her stool or urine presently.  Denies any dark melanotic stools.  Her last IV iron was about 6 months ago and she does not remember what type of IV iron she has received.   She sees Dr. Esperanza Sheets from [facility] for mast cell activation syndrome   Interval history- she is in better spirits today. Does not have any current migraine. Does report restless leg syndrome making it difficult for her to sleep  ECOG PS- 1 Pain scale- 0 Opioid associated constipation- no  Review of systems- Review of Systems  Constitutional: Positive for malaise/fatigue. Negative for chills, fever and weight loss.  HENT: Negative for congestion, ear discharge and nosebleeds.   Eyes: Negative for blurred vision.  Respiratory: Negative for cough, hemoptysis, sputum production, shortness of breath and wheezing.   Cardiovascular: Negative for chest pain, palpitations, orthopnea and claudication.  Gastrointestinal: Negative for abdominal pain, blood in stool, constipation, diarrhea, heartburn, melena, nausea and vomiting.  Genitourinary: Negative for dysuria, flank pain, frequency, hematuria and urgency.  Musculoskeletal: Negative for back pain, joint pain and myalgias.       Restless legs at night  Skin: Negative for rash.  Neurological: Negative for dizziness, tingling, focal weakness, seizures, weakness and headaches.  Endo/Heme/Allergies: Does not bruise/bleed easily.  Psychiatric/Behavioral: Negative for depression and suicidal ideas. The patient does not have insomnia.       Allergies  Allergen Reactions  .  Ioversol Anxiety, Hives, Itching and Palpitations  . Ciprofloxacin Other (See Comments)  . Eggs Or Egg-Derived Products Other (See Comments)    Exacerbates neuralgia symptoms Exacerbates neuralgia symptoms Congestion & Headaches Exacerbates neuralgia  symptoms Congestion & Headaches   . Ginger Other (See Comments)    Congestion & Headaches Congestion & Headaches   . Levofloxacin Other (See Comments)    Congestion & Headaches Congestion & Headaches   . Other Other (See Comments)    Novocaine (IV) form Hearing loss Novocaine (IV) form Hearing loss  Congestion & Headaches Congestion & Headaches Congestion & Headaches Congestion & Headaches   . Soybean-Containing Drug Products Other (See Comments)    Congestion & Headaches   . Bee Pollen   . Bupropion   . Celebrex [Celecoxib]   . Codeine   . Dexilant [Dexlansoprazole]   . Dulera [Mometasone Furo-Formoterol Fum]   . Fentanyl   . Fluticasone   . Gabapentin   . Gluten Meal   . Iodine   . Lidocaine     Rash to topical cream  . Lyrica [Pregabalin]   . Mold Extract [Trichophyton]   . Morphine And Related   . Penicillins     Skin testing positive for allergy  . Pollen Extract   . Prilosec [Omeprazole]   . Salmeterol Xinafoate   . Silenor [Doxepin Hcl]   . Tomato   . Hydrocodone Nausea Only  . Ibuprofen Nausea Only    At high doses At high doses   . Lac Bovis Diarrhea    Facial burning sensation Facial burning sensation Facial burning sensation Facial burning sensation   . Lanolin Other (See Comments)    Exacerbates neuralgia symptoms Wool causes hives.   . Latex Other (See Comments)    Other Reaction: Other reaction "sensitivity" Other Reaction: Other reaction "sensitivity"   . Milk-Related Compounds Diarrhea    Facial burning sensation   . Wheat Bran Diarrhea     Past Medical History:  Diagnosis Date  . Allergy   . Anemia   . Anxiety   . Arthritis   . Asthma   . Basal cell carcinoma   . BRCA negative 09/2018   2015 [place] at [facility]; has VUS; 1/20 MyRisk neg except RPS20 VUS; IBIS=10.8%/riskscore=12%  . Colon cancer ([place])   . Dysautonomia ([place])   . Ehlers-Danlos syndrome type III   . Family history of colon cancer   . GERD (gastroesophageal  reflux disease)   . Hyperlipidemia   . Idiopathic small fiber peripheral neuropathy   . Leiomyoma   . Mast cell activation ([place])   . Migraines   . Neck pain   . Occipital neuralgia   . Sleep apnea      Past Surgical History:  Procedure Laterality Date  . APPENDECTOMY     removed with colectomy  . BREAST SURGERY     reduction  . COLON SURGERY  2008   for colon cancer, ~1/3 of colon removed, primary reanastimosis  . COLONOSCOPY WITH PROPOFOL N/A 10/07/2018   Procedure: COLONOSCOPY WITH PROPOFOL;  Surgeon: Lin Landsman, MD;  Location: [facility] ENDOSCOPY;  Service: Gastroenterology;  Laterality: N/A;  . ENDOMETRIAL ABLATION      Social History   Socioeconomic History  . Marital status: Married    Spouse name: Not on file  . Number of children: 0  . Years of education: Not on file  . Highest education level: Not on file  Occupational History  . Occupation: applied for disability  Social  Needs  . Financial resource strain: Not on file  . Food insecurity:    Worry: Not on file    Inability: Not on file  . Transportation needs:    Medical: Not on file    Non-medical: Not on file  Tobacco Use  . Smoking status: Never Smoker  . Smokeless tobacco: Never Used  Substance and Sexual Activity  . Alcohol use: Never    Frequency: Never  . Drug use: Never  . Sexual activity: Not Currently  Lifestyle  . Physical activity:    Days per week: Not on file    Minutes per session: Not on file  . Stress: Not on file  Relationships  . Social connections:    Talks on phone: Not on file    Gets together: Not on file    Attends religious service: Not on file    Active member of club or organization: Not on file    Attends meetings of clubs or organizations: Not on file    Relationship status: Not on file  . Intimate partner violence:    Fear of current or ex partner: Not on file    Emotionally abused: Not on file    Physically abused: Not on file    Forced sexual activity: Not  on file  Other Topics Concern  . Not on file  Social History Narrative  . Not on file    Family History  Problem Relation Age of Onset  . Colon cancer Father 68  . Breast cancer Maternal Aunt 50       BRCA neg  . Endometrial cancer Maternal Aunt 50  . Colon cancer Maternal Grandmother 58  . Migraines Maternal Grandmother   . Tremor Maternal Grandmother   . Leukemia Maternal Grandfather 51  . Food Allergy Maternal Grandfather   . Migraines Mother   . Glaucoma Mother   . Cataracts Mother   . Tremor Mother   . Fibromyalgia Mother   . Heart disease Mother   . Colon polyps Brother        non-cancerous     Current Outpatient Medications:  .  amitriptyline (ELAVIL) 10 MG tablet, Take 10 mg by mouth at bedtime., Disp: , Rfl:  .  Ascorbic Acid (VITAMIN C) 1000 MG tablet, Take 1,000 mg by mouth daily. Up to 1250/qd, Disp: , Rfl:  .  b complex vitamins capsule, Take 1 capsule by mouth daily., Disp: , Rfl:  .  Blood Pressure Monitoring (BLOOD PRESSURE CUFF) MISC, Take blood pressure as needed for symptoms., Disp: , Rfl:  .  Boswellia Serrata (BOSWELLIA PO), Take 150 mg by mouth every morning. , Disp: , Rfl:  .  CALCIUM CITRATE PO, Take 400 mg by mouth 2 (two) times daily. , Disp: , Rfl:  .  cromolyn (GASTROCROM) 100 MG/5ML solution, Take by mouth 4 (four) times daily -  before meals and at bedtime., Disp: , Rfl:  .  cycloSPORINE (RESTASIS) 0.05 % ophthalmic emulsion, 1 drop 2 (two) times daily., Disp: , Rfl:  .  EMGALITY 120 MG/ML SOSY, every 30 (thirty) days., Disp: , Rfl:  .  estradiol (ESTRACE) 0.1 MG/GM vaginal cream, Place 1 Applicatorful vaginally once a week., Disp: 42.5 g, Rfl: 2 .  famotidine (PEPCID) 10 MG tablet, Take 10 mg by mouth 2 (two) times daily., Disp: , Rfl:  .  ferrous sulfate 325 (65 FE) MG tablet, Take 325 mg by mouth daily with breakfast., Disp: , Rfl:  .  fexofenadine (ALLEGRA) 180  MG tablet, Take 180 mg by mouth daily., Disp: , Rfl:  .  Flaxseed Oil (LINSEED  OIL) OIL, Take by mouth., Disp: , Rfl:  .  Flaxseed, Linseed, (FLAXSEED OIL PO), Take 7,640 mg by mouth daily., Disp: , Rfl:  .  guaifenesin (HUMIBID E) 400 MG TABS tablet, Take 400 mg by mouth daily. , Disp: , Rfl:  .  hydrOXYzine (ATARAX/VISTARIL) 50 MG tablet, Take 50 mg by mouth daily. , Disp: , Rfl:  .  ketotifen (ZADITOR) 0.025 % ophthalmic solution, Administer 1 drop to both eyes Two (2) times a day. And also take 4 drops ORALLY up to 4 times daily as needed., Disp: , Rfl:  .  L-Lysine 500 MG CAPS, Take by mouth 2 (two) times daily., Disp: , Rfl:  .  L-Theanine 100 MG CAPS, Take 200 mg by mouth. , Disp: , Rfl:  .  levocetirizine (XYZAL) 5 MG tablet, Take 5 mg by mouth 2 (two) times daily. , Disp: , Rfl:  .  Light Mineral Oil-Mineral Oil 0.5-0.5 % EMUL, Apply to eye., Disp: , Rfl:  .  Lysine HCl 1000 MG TABS, Take by mouth., Disp: , Rfl:  .  magnesium oxide (MAG-OX) 400 MG tablet, Take 400 mg by mouth daily., Disp: , Rfl:  .  Melatonin 1 MG/ML LIQD, Take by mouth daily. , Disp: , Rfl:  .  montelukast (SINGULAIR) 10 MG tablet, Take 10 mg by mouth at bedtime., Disp: , Rfl:  .  polyethylene glycol (MIRALAX / GLYCOLAX) packet, Take 17 g by mouth daily as needed. , Disp: , Rfl:  .  pseudoephedrine (SUDAFED) 30 MG tablet, Take 30 mg by mouth every 4 (four) hours as needed for congestion., Disp: , Rfl:  .  pyridostigmine (MESTINON) 60 MG tablet, Take by mouth. Pt cuts tablet in quarters and takes 15 mg tablet BID., Disp: , Rfl:  .  Sodium Fluoride (PREVIDENT 5000 BOOSTER) 1.1 % PSTE, Place onto teeth., Disp: , Rfl:  .  Vitamin D, Cholecalciferol, 10 MCG (400 UNIT) TABS, Take by mouth daily. , Disp: , Rfl:  .  vitamin k 100 MCG tablet, Take 100 mcg by mouth 4 (four) times daily., Disp: , Rfl:  .  acetaminophen (TYLENOL) 500 MG tablet, Take 500 mg by mouth every 6 (six) hours as needed., Disp: , Rfl:  .  albuterol (PROVENTIL HFA;VENTOLIN HFA) 108 (90 Base) MCG/ACT inhaler, Inhale into the lungs.,  Disp: , Rfl:  .  augmented betamethasone dipropionate (DIPROLENE-AF) 0.05 % cream, APPLY TWICE DAILY AS NEEDED TO THE AFFECTED AREAS FOR 5-7 DAYS FOR ECZEMA., Disp: , Rfl:  .  betamethasone dipropionate (DIPROLENE) 0.05 % cream, Apply topically as needed. , Disp: , Rfl:  .  diphenhydrAMINE (BENADRYL) 12.5 MG/5ML liquid, Take by mouth 4 (four) times daily as needed., Disp: , Rfl:  .  diphenhydrAMINE-Allantoin 2-0.5 % CREA, Apply topically., Disp: , Rfl:  .  EPINEPHrine 0.3 mg/0.3 mL IJ SOAJ injection, INJECT ONE AUTO-INJECTOR INTRAMUSCULARLY AS NEEDED, Disp: , Rfl:  .  loperamide (IMODIUM) 2 MG capsule, Take by mouth as needed for diarrhea or loose stools., Disp: , Rfl:  .  Psyllium 400 MG CAPS, Take by mouth as needed. , Disp: , Rfl:  .  simethicone (MYLICON) 80 MG chewable tablet, Chew 80 mg by mouth every 6 (six) hours as needed for flatulence., Disp: , Rfl:  .  TRIAMCINOLONE ACETONIDE,NASAL, NA, Place into the nose as needed. , Disp: , Rfl:   Physical exam:  Vitals:  11/04/18 1136  BP: 112/77  Pulse: 84  Resp: 18  Temp: 98 F (36.7 C)  TempSrc: Tympanic  Weight: 140 lb (63.5 kg)   Physical Exam Constitutional:      General: She is not in acute distress.    Comments: She is wearing a hat and cervical collar  HENT:     Head: Normocephalic and atraumatic.  Eyes:     Pupils: Pupils are equal, round, and reactive to light.  Neck:     Musculoskeletal: Normal range of motion.  Cardiovascular:     Rate and Rhythm: Normal rate and regular rhythm.     Heart sounds: Normal heart sounds.  Pulmonary:     Effort: Pulmonary effort is normal.     Breath sounds: Normal breath sounds.  Abdominal:     General: Bowel sounds are normal.     Palpations: Abdomen is soft.  Skin:    General: Skin is warm and dry.  Neurological:     Mental Status: She is alert and oriented to person, place, and time.      No flowsheet data found. CBC Latest Ref Rng & Units 11/04/2018  WBC 4.0 - 10.5 K/uL  6.8  Hemoglobin 12.0 - 15.0 g/dL 12.5  Hematocrit 36.0 - 46.0 % 38.6  Platelets 150 - 400 K/uL 304     Assessment and plan- Patient is a 48 y.o. female with following issues:  1.  Iron deficiency anemia: Today she is not anemic however her ferritin is low at 39.  I will therefore plan to give her 5 doses of Venofer 200 mg over 3 weeks I will also premedicate her with Benadryl 12.5 mg IV given her history of mast cell activation syndrome.  Discussed risks and benefits of benefit including all but not limited to headache, leg swelling and possible risk of infusion reaction.  Patient understands and agrees to proceed as planned.  Repeat CBC ferritin iron studies in 3 in 6 months and I will see her back in 6 months  2.  Personal history of colon cancer: This was back in 2009 and she does not require any surveillance scans at this time.  She is being managed as a Lynch syndrome patient and follows up with Dr. Marius Ditch.  She has also established care with GYN and will be getting yearly pelvic exams and pelvic ultrasound through them.  3.  Mast cell activation syndrome: Being followed by Dr.Iweala at [facility]  4. Dysautonomia and POTS: Being managed by cardiology   Visit Diagnosis 1. History of colon cancer   2. Iron deficiency anemia, unspecified iron deficiency anemia type   3. Iron deficiency      Dr. Randa Evens, MD, MPH [facility] [phone] 11/05/2018 10:07 AM

## 2018-11-07 ENCOUNTER — Telehealth: Payer: Self-pay | Admitting: *Deleted

## 2018-11-07 NOTE — Telephone Encounter (Signed)
I called the patient on Friday at 530 to let her know that her ferritin was low.  The Janese Banks suggested to give her 5 doses of Venofer over 3 weeks.  Patient was agreeable to this.  Told her I would send in basket message to our scheduling because they have already left and will call her back 1 day next week to get set up.  Patient had other questions as to why her ferritin is low and I have put those comments into an in basket to Dr. Janese Banks to see if she can answer her questions about why the ferritin continues to be low

## 2018-11-08 ENCOUNTER — Encounter: Payer: Self-pay | Admitting: Gastroenterology

## 2018-11-08 NOTE — Telephone Encounter (Signed)
Hi Rohini, Can you please review her chart and see if there is a way she can get endoscopy? You have seen her recently. She has iron deficiency

## 2018-11-09 ENCOUNTER — Inpatient Hospital Stay: Payer: No Typology Code available for payment source

## 2018-11-09 ENCOUNTER — Encounter: Payer: No Typology Code available for payment source | Admitting: Physical Therapy

## 2018-11-09 VITALS — BP 122/85 | HR 99 | Temp 97.7°F | Resp 18

## 2018-11-09 DIAGNOSIS — D509 Iron deficiency anemia, unspecified: Secondary | ICD-10-CM | POA: Diagnosis not present

## 2018-11-09 DIAGNOSIS — E611 Iron deficiency: Secondary | ICD-10-CM

## 2018-11-09 MED ORDER — IRON SUCROSE 20 MG/ML IV SOLN
200.0000 mg | Freq: Once | INTRAVENOUS | Status: AC
  Administered 2018-11-09: 200 mg via INTRAVENOUS
  Filled 2018-11-09: qty 10

## 2018-11-09 MED ORDER — SODIUM CHLORIDE 0.9 % IV SOLN
Freq: Once | INTRAVENOUS | Status: AC
  Administered 2018-11-09: 14:00:00 via INTRAVENOUS
  Filled 2018-11-09: qty 250

## 2018-11-09 MED ORDER — SODIUM CHLORIDE 0.9 % IV SOLN: 200.0000 mg | Freq: Once | INTRAVENOUS | Status: DC

## 2018-11-09 MED ORDER — DIPHENHYDRAMINE HCL 50 MG/ML IJ SOLN
12.5000 mg | INTRAMUSCULAR | Status: DC
  Administered 2018-11-09: 12.5 mg via INTRAVENOUS
  Filled 2018-11-09: qty 1

## 2018-11-11 ENCOUNTER — Other Ambulatory Visit (INDEPENDENT_AMBULATORY_CARE_PROVIDER_SITE_OTHER): Payer: Self-pay | Admitting: Family Medicine

## 2018-11-12 ENCOUNTER — Other Ambulatory Visit (INDEPENDENT_AMBULATORY_CARE_PROVIDER_SITE_OTHER): Payer: Self-pay | Admitting: Family Medicine

## 2018-11-12 ENCOUNTER — Inpatient Hospital Stay: Payer: No Typology Code available for payment source | Attending: Oncology

## 2018-11-12 VITALS — BP 117/66 | HR 84 | Resp 20

## 2018-11-12 DIAGNOSIS — G8929 Other chronic pain: Secondary | ICD-10-CM

## 2018-11-12 DIAGNOSIS — Q796 Ehlers-Danlos syndrome, unspecified: Secondary | ICD-10-CM

## 2018-11-12 DIAGNOSIS — G4700 Insomnia, unspecified: Secondary | ICD-10-CM

## 2018-11-12 DIAGNOSIS — D509 Iron deficiency anemia, unspecified: Secondary | ICD-10-CM | POA: Insufficient documentation

## 2018-11-12 DIAGNOSIS — E611 Iron deficiency: Secondary | ICD-10-CM

## 2018-11-12 MED ORDER — DIPHENHYDRAMINE HCL 50 MG/ML IJ SOLN
12.5000 mg | INTRAMUSCULAR | Status: DC
  Administered 2018-11-12: 12.5 mg via INTRAVENOUS
  Filled 2018-11-12: qty 1

## 2018-11-12 MED ORDER — SODIUM CHLORIDE 0.9 % IV SOLN
Freq: Once | INTRAVENOUS | Status: AC
  Administered 2018-11-12: 14:00:00 via INTRAVENOUS
  Filled 2018-11-12: qty 250

## 2018-11-12 MED ORDER — IRON SUCROSE 20 MG/ML IV SOLN
200.0000 mg | Freq: Once | INTRAVENOUS | Status: AC
  Administered 2018-11-12: 200 mg via INTRAVENOUS
  Filled 2018-11-12: qty 10

## 2018-11-12 NOTE — Progress Notes (Signed)
Patient receiving 2nd dose of venofer, infusion over half way finished, pt c/o "mast reaction", medication stopped, NS infusing ,  BP 142/85 pt states that is very high for her, oximetry 99% RA, spoke with Dr Janese Banks, pt states she is ok with not finishing the venofer, she questons what she should do for next visit, will send secure chat message to Dr Janese Banks and her nurse to contact pt prior to next visit for any additional needs.

## 2018-11-15 ENCOUNTER — Other Ambulatory Visit: Payer: Self-pay | Admitting: Oncology

## 2018-11-16 ENCOUNTER — Inpatient Hospital Stay: Payer: 59 | Attending: Oncology

## 2018-11-16 ENCOUNTER — Encounter: Payer: No Typology Code available for payment source | Admitting: Physical Therapy

## 2018-11-16 VITALS — BP 115/81 | HR 71 | Temp 98.0°F | Resp 18

## 2018-11-16 DIAGNOSIS — D509 Iron deficiency anemia, unspecified: Secondary | ICD-10-CM | POA: Insufficient documentation

## 2018-11-16 DIAGNOSIS — E611 Iron deficiency: Secondary | ICD-10-CM

## 2018-11-16 MED ORDER — IRON SUCROSE 20 MG/ML IV SOLN
200.0000 mg | Freq: Once | INTRAVENOUS | Status: AC
  Administered 2018-11-16: 200 mg via INTRAVENOUS
  Filled 2018-11-16: qty 10

## 2018-11-16 MED ORDER — DIPHENHYDRAMINE HCL 50 MG/ML IJ SOLN
25.0000 mg | INTRAMUSCULAR | Status: DC
  Administered 2018-11-16: 25 mg via INTRAVENOUS
  Filled 2018-11-16: qty 1

## 2018-11-16 MED ORDER — SODIUM CHLORIDE 0.9 % IV SOLN
Freq: Once | INTRAVENOUS | Status: AC
  Administered 2018-11-16: 14:00:00 via INTRAVENOUS
  Filled 2018-11-16: qty 250

## 2018-11-16 NOTE — Progress Notes (Signed)
Pt verbalizes that she took a "dose of Pepcid and Allegra before coming" today for Venofer infusion, per orders administered 25mg  Benadryl IV and pt tolerated iron infusion well.

## 2018-11-19 ENCOUNTER — Inpatient Hospital Stay: Payer: 59

## 2018-11-19 VITALS — BP 105/70 | HR 72 | Resp 20

## 2018-11-19 DIAGNOSIS — E611 Iron deficiency: Secondary | ICD-10-CM

## 2018-11-19 DIAGNOSIS — D509 Iron deficiency anemia, unspecified: Secondary | ICD-10-CM | POA: Diagnosis not present

## 2018-11-19 MED ORDER — DIPHENHYDRAMINE HCL 50 MG/ML IJ SOLN
INTRAMUSCULAR | Status: AC
  Filled 2018-11-19: qty 1

## 2018-11-19 MED ORDER — SODIUM CHLORIDE 0.9 % IV SOLN
Freq: Once | INTRAVENOUS | Status: AC
  Administered 2018-11-19: 14:00:00 via INTRAVENOUS
  Filled 2018-11-19: qty 250

## 2018-11-19 MED ORDER — IRON SUCROSE 20 MG/ML IV SOLN
200.0000 mg | Freq: Once | INTRAVENOUS | Status: AC
  Administered 2018-11-19: 200 mg via INTRAVENOUS
  Filled 2018-11-19: qty 10

## 2018-11-19 MED ORDER — DIPHENHYDRAMINE HCL 50 MG/ML IJ SOLN
25.0000 mg | INTRAMUSCULAR | Status: DC
  Administered 2018-11-19: 25 mg via INTRAVENOUS

## 2018-11-23 ENCOUNTER — Encounter: Payer: No Typology Code available for payment source | Admitting: Physical Therapy

## 2018-11-23 ENCOUNTER — Inpatient Hospital Stay: Payer: 59

## 2018-11-23 VITALS — BP 117/82 | HR 80 | Temp 98.1°F | Resp 20

## 2018-11-23 DIAGNOSIS — E611 Iron deficiency: Secondary | ICD-10-CM

## 2018-11-23 DIAGNOSIS — D509 Iron deficiency anemia, unspecified: Secondary | ICD-10-CM | POA: Diagnosis not present

## 2018-11-23 MED ORDER — SODIUM CHLORIDE 0.9 % IV SOLN
INTRAVENOUS | Status: DC
  Administered 2018-11-23: 14:00:00 via INTRAVENOUS
  Filled 2018-11-23: qty 250

## 2018-11-23 MED ORDER — DIPHENHYDRAMINE HCL 50 MG/ML IJ SOLN
25.0000 mg | INTRAMUSCULAR | Status: DC
  Administered 2018-11-23: 25 mg via INTRAVENOUS
  Filled 2018-11-23: qty 1

## 2018-11-23 MED ORDER — IRON SUCROSE 20 MG/ML IV SOLN
200.0000 mg | Freq: Once | INTRAVENOUS | Status: AC
  Administered 2018-11-23: 200 mg via INTRAVENOUS
  Filled 2018-11-23: qty 10

## 2018-11-23 NOTE — Progress Notes (Signed)
Give 25mg  of benadryl per MD.

## 2018-11-29 ENCOUNTER — Other Ambulatory Visit: Payer: Self-pay | Admitting: Gastroenterology

## 2018-11-29 ENCOUNTER — Encounter: Payer: Self-pay | Admitting: Gastroenterology

## 2018-11-29 DIAGNOSIS — R140 Abdominal distension (gaseous): Secondary | ICD-10-CM

## 2018-11-29 MED ORDER — PANCRELIPASE (LIP-PROT-AMYL) 25000-79000 UNITS PO CPEP: 25000.0000 [IU] | ORAL_CAPSULE | Freq: Three times a day (TID) | ORAL | 3 refills | Status: DC

## 2018-11-30 ENCOUNTER — Encounter: Payer: No Typology Code available for payment source | Admitting: Physical Therapy

## 2018-11-30 ENCOUNTER — Other Ambulatory Visit: Payer: Self-pay | Admitting: Gastroenterology

## 2018-11-30 DIAGNOSIS — R140 Abdominal distension (gaseous): Secondary | ICD-10-CM

## 2018-11-30 MED ORDER — PANCRELIPASE (LIP-PROT-AMYL) 10000-32000 UNITS PO CPEP: 10000.0000 [IU] | ORAL_CAPSULE | Freq: Three times a day (TID) | ORAL | 0 refills | Status: DC | PRN

## 2018-12-03 ENCOUNTER — Encounter: Payer: Self-pay | Admitting: Oncology

## 2018-12-07 ENCOUNTER — Encounter: Payer: No Typology Code available for payment source | Admitting: Physical Therapy

## 2018-12-13 ENCOUNTER — Encounter: Payer: No Typology Code available for payment source | Admitting: Physical Therapy

## 2018-12-14 ENCOUNTER — Encounter: Payer: No Typology Code available for payment source | Admitting: Physical Therapy

## 2018-12-20 ENCOUNTER — Encounter: Payer: Self-pay | Admitting: Physical Therapy

## 2018-12-20 DIAGNOSIS — M25552 Pain in left hip: Secondary | ICD-10-CM

## 2018-12-20 DIAGNOSIS — M542 Cervicalgia: Secondary | ICD-10-CM

## 2018-12-20 DIAGNOSIS — M25562 Pain in left knee: Principal | ICD-10-CM

## 2018-12-20 DIAGNOSIS — R278 Other lack of coordination: Secondary | ICD-10-CM

## 2018-12-20 DIAGNOSIS — M6289 Other specified disorders of muscle: Secondary | ICD-10-CM

## 2018-12-20 DIAGNOSIS — G8929 Other chronic pain: Secondary | ICD-10-CM

## 2018-12-20 DIAGNOSIS — S43119S Subluxation of unspecified acromioclavicular joint, sequela: Secondary | ICD-10-CM

## 2018-12-20 NOTE — Therapy (Signed)
[place] MAIN [facility] SERVICES [address] [place], [place], [address] Phone: [phone]   Fax:  [phone]  Patient Details  Name: Autumn Zhang MRN: [id number] Date of Birth: [birth year] Referring Provider:   Encounter Date: 12/20/2018   Discharge Summary    Pt is self-discharging at this time. Pt notified the front desk on 10/04/18 "Patient left a VM message that she needed to cancel her appts with you, that she is having PT on her shoulder right now and its just too much for her to handle that she will call back later to reschedule if she needs any further therapy. The shoulder is more pressing for her at this time."  Across 6 visits, pt has demonstrated proper deep core coordination with less straining of pelvic floor. Pt has been educated on abdominal self-massage to release abdominal scars to improve motility.  Pt has shown  good carry over with cervical retraction/scapular stabilization from previous sessions and  demo'd proper upper kinetic chain alignment in bed mobility modification which will help pt minimize worsening of R shoulder labral tear and injuries to L shoulder. Pt used to get out of bed with a crunch / sit up method which placed excessive strain onto the pelvic floor. This new technique to get out of bed will help to also minimize worsening of pelvic floor dysfunctions.  Internal rectal assessment showed dyscoordination and L puborectalis mm tightness but with training, pt demo'd technique for lengthening pelvic floor correctly alnd also demo'd decreased pelvic floor tightness. Pt noticed she is more relaxed. Pt's increased ability to lengthen her pelvic floor will help with better elimination of bowels.   Pt was referred to her orthopedic doctor to assess L shoulder. 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           PT Long  Term Goals - 08/24/18 1355      PT LONG TERM GOAL #1   Title  Pt will decrease her NDI score from % to < % in order to restore functional activities     Time  12    Period  Weeks    Status  On-going      PT LONG TERM GOAL #2   Title  Pt will increase her score on PSFS score with walking > 45 min 5/10 , sit on hard surface, upright 5/10, rotate hip 2/10 to > 8/10 in order to perform ADLs    Time  12    Period  Weeks    Status  On-going      PT LONG TERM GOAL #3   Title  Pt will decrease COREFO score from 34% to <24 % in order to restore GI function      Time  12    Status  On-going      PT LONG TERM GOAL #4   Title  Pt will demo less forward head / dowager's hump, alignment of auditory meatus at acromium in order to minimize neck pain and provide scapular stability for reaching with arms     Time  8    Period  Weeks    Status  On-going      PT LONG TERM GOAL #5   Title  Pt will demo IND with stability exercises to minimze hypermobilty compensations 2/2 EDS     Time  4    Period  Weeks    Status  On-going      PT LONG TERM GOAL #6   Title  Pt will demo proper pelvic floor coordination and lengthening in order to progress to deep core stabilization exercises and minimize straining with bowel movements     Time  4    Period  Weeks    Status  On-going        Jerl Mina ,PT, DPT, E-RYT  12/20/2018, 10:37 AM  [facility] [address] [place], [place], [address] Phone: [phone]   Fax:  [phone]

## 2018-12-28 ENCOUNTER — Encounter: Payer: No Typology Code available for payment source | Admitting: Physical Therapy

## 2019-01-11 ENCOUNTER — Encounter: Payer: No Typology Code available for payment source | Admitting: Physical Therapy

## 2019-01-25 ENCOUNTER — Encounter: Payer: No Typology Code available for payment source | Admitting: Physical Therapy

## 2019-01-31 ENCOUNTER — Encounter: Payer: Self-pay | Admitting: Gastroenterology

## 2019-02-01 ENCOUNTER — Other Ambulatory Visit: Payer: Self-pay

## 2019-02-02 ENCOUNTER — Inpatient Hospital Stay: Payer: No Typology Code available for payment source | Attending: Oncology

## 2019-02-02 ENCOUNTER — Other Ambulatory Visit: Payer: Self-pay

## 2019-02-02 DIAGNOSIS — D509 Iron deficiency anemia, unspecified: Secondary | ICD-10-CM | POA: Insufficient documentation

## 2019-02-02 DIAGNOSIS — Z85038 Personal history of other malignant neoplasm of large intestine: Secondary | ICD-10-CM

## 2019-02-02 LAB — IRON AND TIBC
Iron: 79 ug/dL (ref 28–170)
Saturation Ratios: 23 % (ref 10.4–31.8)
TIBC: 340 ug/dL (ref 250–450)
UIBC: 262 ug/dL

## 2019-02-02 LAB — CBC
HCT: 40.5 % (ref 36.0–46.0)
Hemoglobin: 12.8 g/dL (ref 12.0–15.0)
MCH: 29.4 pg (ref 26.0–34.0)
MCHC: 31.6 g/dL (ref 30.0–36.0)
MCV: 92.9 fL (ref 80.0–100.0)
Platelets: 290 10*3/uL (ref 150–400)
RBC: 4.36 MIL/uL (ref 3.87–5.11)
RDW: 12.4 % (ref 11.5–15.5)
WBC: 6.1 10*3/uL (ref 4.0–10.5)
nRBC: 0 % (ref 0.0–0.2)

## 2019-02-02 LAB — FERRITIN: Ferritin: 196 ng/mL (ref 11–307)

## 2019-02-08 ENCOUNTER — Encounter: Payer: Self-pay | Admitting: Oncology

## 2019-02-08 ENCOUNTER — Encounter: Payer: No Typology Code available for payment source | Admitting: Physical Therapy

## 2019-03-04 ENCOUNTER — Other Ambulatory Visit: Payer: Self-pay

## 2019-03-04 ENCOUNTER — Ambulatory Visit (INDEPENDENT_AMBULATORY_CARE_PROVIDER_SITE_OTHER): Payer: No Typology Code available for payment source | Admitting: Gastroenterology

## 2019-03-04 DIAGNOSIS — M6289 Other specified disorders of muscle: Secondary | ICD-10-CM | POA: Diagnosis not present

## 2019-03-04 DIAGNOSIS — D8940 Mast cell activation, unspecified: Secondary | ICD-10-CM

## 2019-03-04 DIAGNOSIS — R1012 Left upper quadrant pain: Secondary | ICD-10-CM | POA: Diagnosis not present

## 2019-03-04 DIAGNOSIS — R109 Unspecified abdominal pain: Secondary | ICD-10-CM

## 2019-03-04 MED ORDER — PANCRELIPASE (LIP-PROT-AMYL) 4200 UNITS PO CPEP: 1.0000 | ORAL_CAPSULE | Freq: Three times a day (TID) | ORAL | 0 refills | Status: AC

## 2019-03-04 NOTE — Progress Notes (Signed)
Autumn Sear, MD [address]  [place]  [place],  [address]  Main: [phone]  Fax: [phone]    Gastroenterology Consultation Video Visit  Referring Provider:     Herma Carson, MD Primary Care Physician:  Herma Carson, MD Primary Gastroenterologist:  Dr. [name] Reason for Consultation:     Chronic abdominal pain, bloating, pelvic floor dysfunction        HPI:   Autumn Zhang is a 48 y.o. female referred by Dr. Herma Carson, MD  for consultation & management of chronic left upper quadrant abdominal pain, bloating, pelvic floor dysfunction  Virtual Visit Video Note  I connected with Autumn Zhang on 03/04/19 at  8:45 AM EDT by video and verified that I am speaking with the correct person using two identifiers.   I discussed the limitations, risks, security and privacy concerns of performing an evaluation and management service by video and the availability of in person appointments. I also discussed with the patient that there may be a patient responsible charge related to this service. The patient expressed understanding and agreed to proceed.  Location of the Patient: Home  Location of the provider: Office  Persons participating in the visit: Patient and provider only   History of Present Illness: Autumn Zhang reports that she has tried low-dose Creon and Zenpep which resulted in massive reactivation.  Therefore, she stopped taking pancreatic enzymes.  She continues to have mild degree of chronic left upper quadrant discomfort.  Her diet consists of mostly blended, liquid food.  She is trying to eat chicken but not able to tolerate it well.  Her weight has been stable.  She continues to have increased bowel frequency. She is currently on amitriptyline 10 mg daily.  She also has appointment with [facility] that she is planning to keep it.  She did not see the dietitian at [facility] referred by her allergy specialist. 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  She could not open her mouth wide enough to fit even the pediatric bite block.   NSAIDs: None  Antiplts/Anticoagulants/Anti thrombotics: None  GI Procedures:  Colonoscopy in 2018 by Dr Gala Lewandowsky   Colonoscopy 10/07/2018 - Patent end-to-side ileo-colonic anastomosis, characterized by healthy appearing mucosa. - Two diminutive polyps in the transverse colon, removed with a cold biopsy forceps. Resected and retrieved. - The distal rectum and anal verge are normal on retroflexion view. - The examination was otherwise normal. - The examined portion of the ileum was normal.  DIAGNOSIS:  A. COLON POLYP X2, TRANSVERSE; COLD BIOPSY:  - TUBULAR ADENOMA (1).  - COLONIC MUCOSA WITH FOCAL LYMPHOID AGGREGATE (1).  - NEGATIVE FOR HIGH-GRADE DYSPLASIA AND MALIGNANCY.  Past Medical History:  Diagnosis Date  . Allergy   . Anemia   . Anxiety   . Arthritis   . Asthma   . Basal cell carcinoma   . BRCA negative 09/2018   2015 [place] at [facility]; has VUS; 1/20 MyRisk neg except RPS20 VUS; IBIS=10.8%/riskscore=12%  . Colon cancer ([place])   . Dysautonomia ([place])   . Ehlers-Danlos syndrome type III   . Family history of colon cancer   . GERD (gastroesophageal reflux disease)   . Hyperlipidemia   . Idiopathic small fiber peripheral neuropathy   . Leiomyoma   . 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 Migraines   . Neck pain   . Occipital neuralgia   . Sleep apnea  Past Surgical History:  Procedure Laterality Date  . APPENDECTOMY     removed with colectomy  . BREAST SURGERY     reduction  . COLON SURGERY  2008   for colon cancer, ~1/3 of colon removed, primary reanastimosis  . COLONOSCOPY WITH PROPOFOL N/A 10/07/2018   Procedure: COLONOSCOPY WITH PROPOFOL;  Surgeon: Lin Landsman, MD;  Location: [facility] ENDOSCOPY;  Service: Gastroenterology;  Laterality: N/A;  . ENDOMETRIAL ABLATION      Current Outpatient Medications:  .   acetaminophen (TYLENOL) 500 MG tablet, Take 500 mg by mouth every 6 (six) hours as needed., Disp: , Rfl:  .  albuterol (PROVENTIL HFA;VENTOLIN HFA) 108 (90 Base) MCG/ACT inhaler, Inhale into the lungs., Disp: , Rfl:  .  amitriptyline (ELAVIL) 10 MG tablet, Take 10 mg by mouth at bedtime., Disp: , Rfl:  .  Ascorbic Acid (VITAMIN C) 1000 MG tablet, Take 1,000 mg by mouth daily. Up to 1250/qd, Disp: , Rfl:  .  aspirin 325 MG tablet, Take by mouth., Disp: , Rfl:  .  augmented betamethasone dipropionate (DIPROLENE-AF) 0.05 % cream, APPLY TWICE DAILY AS NEEDED TO THE AFFECTED AREAS FOR 5-7 DAYS FOR ECZEMA., Disp: , Rfl:  .  b complex vitamins capsule, Take 1 capsule by mouth daily., Disp: , Rfl:  .  BELSOMRA 5 MG TABS, Take 1 tablet by mouth at bedtime., Disp: , Rfl:  .  betamethasone dipropionate (DIPROLENE) 0.05 % cream, Apply topically as needed. , Disp: , Rfl:  .  Blood Pressure Monitoring (BLOOD PRESSURE CUFF) MISC, Take blood pressure as needed for symptoms., Disp: , Rfl:  .  Boswellia Serrata (BOSWELLIA PO), Take 150 mg by mouth every morning. , Disp: , Rfl:  .  CALCIUM CITRATE PO, Take 400 mg by mouth 2 (two) times daily. , Disp: , Rfl:  .  cromolyn (GASTROCROM) 100 MG/5ML solution, Take by mouth 4 (four) times daily -  before meals and at bedtime., Disp: , Rfl:  .  cycloSPORINE (RESTASIS) 0.05 % ophthalmic emulsion, 1 drop 2 (two) times daily., Disp: , Rfl:  .  diphenhydrAMINE (BENADRYL) 12.5 MG/5ML liquid, Take by mouth 4 (four) times daily as needed., Disp: , Rfl:  .  diphenhydrAMINE-Allantoin 2-0.5 % CREA, Apply topically., Disp: , Rfl:  .  docusate sodium (COLACE) 100 MG capsule, Take 100 mg by mouth daily., Disp: , Rfl:  .  EPINEPHrine 0.3 mg/0.3 mL IJ SOAJ injection, INJECT ONE AUTO-INJECTOR INTRAMUSCULARLY AS NEEDED, Disp: , Rfl:  .  estradiol (ESTRACE) 0.1 MG/GM vaginal cream, Place 1 Applicatorful vaginally once a week., Disp: 42.5 g, Rfl: 2 .  famotidine (PEPCID) 10 MG tablet, Take 10  mg by mouth 2 (two) times daily., Disp: , Rfl:  .  fexofenadine (ALLEGRA) 180 MG tablet, Take 180 mg by mouth daily., Disp: , Rfl:  .  Flaxseed Oil (LINSEED OIL) OIL, Take by mouth., Disp: , Rfl:  .  guaifenesin (HUMIBID E) 400 MG TABS tablet, Take 400 mg by mouth daily. , Disp: , Rfl:  .  hydrOXYzine (ATARAX/VISTARIL) 50 MG tablet, Take 50 mg by mouth daily. , Disp: , Rfl:  .  ketotifen (ZADITOR) 0.025 % ophthalmic solution, Administer 1 drop to both eyes Two (2) times a day. And also take 4 drops ORALLY up to 4 times daily as needed., Disp: , Rfl:  .  L-Lysine 500 MG CAPS, Take by mouth 2 (two) times daily., Disp: , Rfl:  .  L-Theanine 100 MG CAPS, Take 200 mg by mouth. ,  Disp: , Rfl:  .  levocetirizine (XYZAL) 5 MG tablet, Take 5 mg by mouth 2 (two) times daily. , Disp: , Rfl:  .  lidocaine (XYLOCAINE) 2 % solution, USE 2 ML INTO EACH NOSTRIL ONCE A DAY AS NEEDED FOR PAIN., Disp: , Rfl:  .  Light Mineral Oil-Mineral Oil 0.5-0.5 % EMUL, Apply to eye., Disp: , Rfl:  .  loperamide (IMODIUM) 2 MG capsule, Take by mouth as needed for diarrhea or loose stools., Disp: , Rfl:  .  Lysine HCl 1000 MG TABS, Take by mouth., Disp: , Rfl:  .  Magnesium Citrate POWD, Take by mouth., Disp: , Rfl:  .  Melatonin 1 MG/ML LIQD, Take by mouth daily. , Disp: , Rfl:  .  montelukast (SINGULAIR) 10 MG tablet, Take 10 mg by mouth at bedtime., Disp: , Rfl:  .  polyethylene glycol (MIRALAX / GLYCOLAX) packet, Take 17 g by mouth daily as needed. , Disp: , Rfl:  .  pseudoephedrine (SUDAFED) 30 MG tablet, Take 30 mg by mouth every 4 (four) hours as needed for congestion., Disp: , Rfl:  .  Psyllium 400 MG CAPS, Take by mouth as needed. , Disp: , Rfl:  .  pyridostigmine (MESTINON) 60 MG tablet, Take by mouth. Pt cuts tablet in quarters and takes 15 mg tablet BID., Disp: , Rfl:  .  simethicone (MYLICON) 80 MG chewable tablet, Chew 80 mg by mouth every 6 (six) hours as needed for flatulence., Disp: , Rfl:  .  Sodium Fluoride  (PREVIDENT 5000 BOOSTER) 1.1 % PSTE, Place onto teeth., Disp: , Rfl:  .  TRIAMCINOLONE ACETONIDE,NASAL, NA, Place into the nose as needed. , Disp: , Rfl:  .  UBRELVY 50 MG TABS, TAKE 1 TABLET BY MOUTH ONCE AS NEEDED FOR UP TO 1 DOSE, Disp: , Rfl:  .  Vitamin D, Cholecalciferol, 10 MCG (400 UNIT) TABS, Take by mouth daily. , Disp: , Rfl:  .  vitamin k 100 MCG tablet, Take 100 mcg by mouth 4 (four) times daily., Disp: , Rfl:  .  BELSOMRA 10 MG TABS, TAKE 10 MG BY MOUTH NIGHTLY AS NEEDED FOR UP TO 30 DAYS, Disp: , Rfl:  .  EMGALITY 120 MG/ML SOSY, every 30 (thirty) days., Disp: , Rfl:  .  ferrous sulfate 325 (65 FE) MG tablet, Take 325 mg by mouth daily with breakfast., Disp: , Rfl:  .  Flaxseed, Linseed, (FLAXSEED OIL PO), Take 7,640 mg by mouth daily., Disp: , Rfl:  .  magnesium oxide (MAG-OX) 400 MG tablet, Take 400 mg by mouth daily., Disp: , Rfl:  .  Pancrelipase, Lip-Prot-Amyl, 4200 units CPEP, Take 1 capsule (4,200 Units total) by mouth 3 (three) times daily with meals for 14 days., Disp: 42 capsule, Rfl: 0 .  rOPINIRole (REQUIP) 0.5 MG tablet, Take by mouth., Disp: , Rfl:     Family History  Problem Relation Age of Onset  . Colon cancer Father 60  . Breast cancer Maternal Aunt 50       BRCA neg  . Endometrial cancer Maternal Aunt 50  . Colon cancer Maternal Grandmother 3  . Migraines Maternal Grandmother   . Tremor Maternal Grandmother   . Leukemia Maternal Grandfather 63  . Food Allergy Maternal Grandfather   . Migraines Mother   . Glaucoma Mother   . Cataracts Mother   . Tremor Mother   . Fibromyalgia Mother   . Heart disease Mother   . Colon polyps Brother  non-cancerous     Social History   Tobacco Use  . Smoking status: Never Smoker  . Smokeless tobacco: Never Used  Substance Use Topics  . Alcohol use: Never    Frequency: Never  . Drug use: Never    Allergies as of 03/04/2019 - Review Complete 03/04/2019  Allergen Reaction Noted  . Ioversol Anxiety,  Hives, Itching, and Palpitations 12/05/2016  . Ciprofloxacin Other (See Comments) 01/12/2018  . Eggs or egg-derived products Other (See Comments) 12/09/2017  . Ginger Other (See Comments) 12/17/2017  . Levofloxacin Other (See Comments) 01/12/2018  . Other Other (See Comments) 12/17/2017  . Soybean-containing drug products Other (See Comments) 12/17/2017  . Bee pollen  08/11/2018  . Bupropion  08/11/2018  . Celebrex [celecoxib]  08/11/2018  . Codeine  08/11/2018  . Dexilant [dexlansoprazole]  08/11/2018  . Dulera [mometasone furo-formoterol fum]  08/11/2018  . Fentanyl  08/11/2018  . Fluticasone  08/11/2018  . Gabapentin  08/11/2018  . Gluten meal  08/11/2018  . Iodine  08/11/2018  . Lidocaine  08/11/2018  . Lyrica [pregabalin]  08/11/2018  . Mold extract [trichophyton]  08/11/2018  . Morphine and related  08/11/2018  . Penicillins  08/11/2018  . Pollen extract  08/11/2018  . Prilosec [omeprazole]  08/11/2018  . Salmeterol xinafoate  02/27/2012  . Silenor [doxepin hcl]  08/11/2018  . Tomato  08/11/2018  . Hydrocodone Nausea Only 09/03/2018  . Ibuprofen Nausea Only 02/27/2012  . Lac bovis Diarrhea 11/13/2014  . Lanolin Other (See Comments) 12/09/2017  . Latex Other (See Comments) 09/03/2018  . Milk-related compounds Diarrhea 11/13/2014  . Wheat bran Diarrhea 11/13/2014     Imaging Studies: Reviewed  Assessment and Plan:   Autumn Zhang is a 48 y.o. Caucasian female with history of colon cancer in 2009 status post right hemicolectomy with primary anastomosis, neoadjuvant chemo, in remission, mast cell activation syndrome, mild pelvic floor dysfunction with mild rectocele seen for follow-up of pelvic floor dysfunction, increased bowel frequency, abdominal bloating, chronic left-sided upper abdominal pain    Left-sided upper abdominal pain, mild chronic Most likely functional abdominal pain She had cross-sectional imaging which was unremarkable, EGD and  colonoscopy unremarkable Continue amitriptyline 10 mg at bedtime  She did not tolerate Amitiza, Trulance, Creon, Zenpep We will try low-dose pancrelipase Suggested her to discuss about EGD and possible flex sig with random colon biopsies to evaluate for microscopic colitis when she sees [facility]   Pelvic floor dysfunction resulting in retained stool Patient has several bowel movements, with incomplete emptying and abdominal bloating Continue pelvic floor biofeedback  Tubular adenoma: Recommend surveillance colonoscopy in 3 years   Follow Up Instructions:   I discussed the assessment and treatment plan with the patient. The patient was provided an opportunity to ask questions and all were answered. The patient agreed with the plan and demonstrated an understanding of the instructions.   The patient was advised to call back or seek an in-person evaluation if the symptoms worsen or if the condition fails to improve as anticipated.  I provided 25 minutes of face-to-face time during this encounter.   Follow up as needed   [name], MD

## 2019-03-30 NOTE — Progress Notes (Signed)
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 Functionally intact. No obvious neuro-muscular anomalies detected.  Muscle Tone/Strength: Functionally intact. No obvious neuro-muscular anomalies detected.  Sensory (Neurological): Neuropathic pain pattern        Sensory (Neurological): Neuropathic pain pattern        DTR: Patellar: deferred today Achilles: deferred today Plantar: deferred today  DTR: Patellar: deferred today Achilles: deferred today Plantar: deferred today  Palpation: No palpable anomalies  Palpation: No palpable anomalies   Assessment  Primary Diagnosis & Pertinent Problem List: The primary encounter diagnosis was Ehlers-Danlos syndrome. Diagnoses of Idiopathic small fiber peripheral neuropathy, Arthralgia of left temporomandibular joint, 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HCC), Patellar instability of left knee, Facial neuralgia, Cervical dystonia, Chronic pain syndrome, and Elevated LFTs were also pertinent to this  visit.  Visit Diagnosis (New problems to examiner): 1. Ehlers-Danlos syndrome   2. Idiopathic small fiber peripheral neuropathy   3. Arthralgia of left temporomandibular joint   4. 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[place])   5. Patellar instability of left knee   6. Facial neuralgia   7. Cervical dystonia   8. Chronic pain syndrome   9. Elevated LFTs    I had extensive discussion with the patient about the goals of pain management.  We discussed nonpharmacological approaches to pain management that include physical therapy, dieting, sleep hygiene, psychotherapy, interventional therapy.  We discussed the importance of understanding the type of pain including neuropathic, nociceptive, centralized.  I also stressed the importance of multimodal analgesia with an emphasis on nondrug modalities including self management, behavioral health support and physical therapy.  We discussed the importance of physical therapy and how a individualized physical therapy and occupational therapy program tailored to patient limitations can be helpful at improving physical function. We also discussed the importance of insomnia and disrupted sleep and how improved sleep hygiene and cognitive therapy could be helpful.  Psychotherapy including CBT, mind-body therapies, pain coping strategies can be helpful for patients whose pain impacts mood, sleep, quality of life, relationships with others.  We discussed avoiding benzodiazepines.  I also had an extensive discussion with the patient about interventional therapies which is my expertise and how these could be incorporated into an effective multimodal pain management plan.  48 year old female with a history of Ehlers-Danlos syndrome, mast cell activation syndrome, joint hypermobility, cervical dystonia, chronic migraines, trigeminal neuralgia, TMJ dysfunction, pots syndrome who presents with a chief complaint of generalized body aches as well as headaches.  Patient has a complex  medical history.  She was previously in the [place] area.  For her complex migraines, she has tried occipital nerve blocks as well as SPG blocks.  She states that the SPG blocks were somewhat helpful initially.  She is hoping to establish with pain management to find a plan for when she has pain flares.  Patient would like to avoid opioid therapy.  She cannot tolerate NSAIDs given her mast cell syndrome.  In the past she states that her LFTs have been elevated but that Tylenol provides her with the most pain relief.  She takes 500 mg daily.  She states that when her LFTs were elevated she was taking 2 to 3 g daily for 3 months.  Muscle relaxants are not indicated especially in the context of her Ehlers-Danlos and joint hypermobility.  Patient does participate in physical therapy once to twice a week.  She avoids heavy resistance  and isometric exercises and participates in low impact exercises to minimize bone and joint damage.  Plan: -Okay to utilize acetaminophen, limited to no more than 2 g daily.  Will obtain comprehensive metabolic panel hepatic function panel followed every 6 months given history of elevated LFTs with therapeutic acetaminophen use. -Continue with physical therapy as this is very helpful for Ehlers-Danlos specifically the hypermobility type. -Consider SPG blocks for migraine management -Consider occipital nerve blocks for occipital neuralgia -Increase amitriptyline to 20 mg nightly.  Can consider increasing further in future. -Avoid NSAIDs, opioids, muscle relaxants -Has tried and failed gabapentin, Lyrica.   Plan of Care (Initial workup plan)   Lab Orders     Comp. Metabolic Panel (12)     Hepatic function panel  Pharmacotherapy (current): Medications ordered:  Meds ordered this encounter  Medications  . DISCONTD: amitriptyline (ELAVIL) 25 MG tablet    Sig: Take 1 tablet (25 mg total) by mouth at bedtime.    Dispense:  30 tablet    Refill:  5  . amitriptyline  (ELAVIL) 10 MG tablet    Sig: Take 2 tablets (20 mg total) by mouth at bedtime.    Dispense:  60 tablet    Refill:  5   Medications administered during this visit: Elliot Cousin had no medications administered during this visit.    Provider-requested follow-up: Return in about 6 months (around 10/01/2019) for virtual, Medication Management.  Future Appointments  Date Time Provider [place]  04/05/2019  8:00 AM Virginia Crews, MD [facility] None  05/09/2019 10:45 AM [facility] None  05/09/2019 11:15 AM Sindy Guadeloupe, MD [facility] None  09/27/2019  8:30 AM Gillis Santa, MD [facility] None    Primary Care Physician: Herma Carson, MD Location: [facility] Note by: Gillis Santa, MD Date: 03/31/2019; Time: 10:18 AM  Note: This dictation was prepared with Dragon dictation. Any transcriptional errors that may result from this process are unintentional.

## 2019-03-31 ENCOUNTER — Ambulatory Visit
Payer: No Typology Code available for payment source | Attending: Student in an Organized Health Care Education/Training Program | Admitting: Student in an Organized Health Care Education/Training Program

## 2019-03-31 ENCOUNTER — Other Ambulatory Visit: Payer: Self-pay

## 2019-03-31 ENCOUNTER — Encounter: Payer: Self-pay | Admitting: Student in an Organized Health Care Education/Training Program

## 2019-03-31 VITALS — BP 119/71 | HR 76 | Temp 98.5°F | Resp 16 | Ht 61.5 in | Wt 135.2 lb

## 2019-03-31 DIAGNOSIS — M25362 Other instability, left knee: Secondary | ICD-10-CM | POA: Insufficient documentation

## 2019-03-31 DIAGNOSIS — D8940 Mast cell activation, unspecified: Secondary | ICD-10-CM | POA: Diagnosis present

## 2019-03-31 DIAGNOSIS — Q796 Ehlers-Danlos syndrome, unspecified: Secondary | ICD-10-CM

## 2019-03-31 DIAGNOSIS — G609 Hereditary and idiopathic neuropathy, unspecified: Secondary | ICD-10-CM | POA: Diagnosis present

## 2019-03-31 DIAGNOSIS — G894 Chronic pain syndrome: Secondary | ICD-10-CM | POA: Insufficient documentation

## 2019-03-31 DIAGNOSIS — G518 Other disorders of facial nerve: Secondary | ICD-10-CM

## 2019-03-31 DIAGNOSIS — R7989 Other specified abnormal findings of blood chemistry: Secondary | ICD-10-CM

## 2019-03-31 DIAGNOSIS — R945 Abnormal results of liver function studies: Secondary | ICD-10-CM | POA: Insufficient documentation

## 2019-03-31 DIAGNOSIS — G243 Spasmodic torticollis: Secondary | ICD-10-CM | POA: Diagnosis present

## 2019-03-31 DIAGNOSIS — M26622 Arthralgia of left temporomandibular joint: Secondary | ICD-10-CM

## 2019-03-31 MED ORDER — AMITRIPTYLINE HCL 25 MG PO TABS: 25.0000 mg | ORAL_TABLET | Freq: Every day | ORAL | 5 refills | Status: DC

## 2019-03-31 MED ORDER — AMITRIPTYLINE HCL 10 MG PO TABS: 20.0000 mg | ORAL_TABLET | Freq: Every day | ORAL | 5 refills | Status: DC

## 2019-03-31 NOTE — Progress Notes (Signed)
Safety precautions to be maintained throughout the outpatient stay will include: orient to surroundings, keep bed in low position, maintain call bell within reach at all times, provide assistance with transfer out of bed and ambulation.  

## 2019-04-01 LAB — COMP. METABOLIC PANEL (12)
AST: 20 IU/L (ref 0–40)
Albumin/Globulin Ratio: 2.1 (ref 1.2–2.2)
Albumin: 4.7 g/dL (ref 3.8–4.8)
Alkaline Phosphatase: 86 IU/L (ref 39–117)
BUN/Creatinine Ratio: 16 (ref 9–23)
BUN: 9 mg/dL (ref 6–24)
Bilirubin Total: 0.5 mg/dL (ref 0.0–1.2)
Calcium: 9.7 mg/dL (ref 8.7–10.2)
Chloride: 104 mmol/L (ref 96–106)
Creatinine, Ser: 0.56 mg/dL — ABNORMAL LOW (ref 0.57–1.00)
GFR calc Af Amer: 127 mL/min/{1.73_m2} (ref >59)
GFR calc non Af Amer: 110 mL/min/{1.73_m2} (ref >59)
Globulin, Total: 2.2 g/dL (ref 1.5–4.5)
Glucose: 94 mg/dL (ref 65–99)
Potassium: 4.2 mmol/L (ref 3.5–5.2)
Sodium: 143 mmol/L (ref 134–144)
Total Protein: 6.9 g/dL (ref 6.0–8.5)

## 2019-04-01 LAB — HEPATIC FUNCTION PANEL
ALT: 17 IU/L (ref 0–32)
Bilirubin, Direct: 0.13 mg/dL (ref 0.00–0.40)

## 2019-04-05 ENCOUNTER — Ambulatory Visit: Payer: No Typology Code available for payment source | Admitting: Family Medicine

## 2019-04-10 DIAGNOSIS — M7531 Calcific tendinitis of right shoulder: Secondary | ICD-10-CM | POA: Insufficient documentation

## 2019-04-13 DIAGNOSIS — M7501 Adhesive capsulitis of right shoulder: Secondary | ICD-10-CM | POA: Insufficient documentation

## 2019-04-13 NOTE — Progress Notes (Signed)
Patient: Autumn Zhang Female    DOB: [birth year]   48 y.o.   MRN: [id number] Visit Date: 04/14/2019  Today's Provider: [name], MD   Chief Complaint  Patient presents with  . Hyperlipidemia  . Prediabetes  . Migraine   Subjective:    Virtual Visit via Video Note  I connected with Wrenley Stapleford Borton on 04/14/19 at  1:40 PM EDT by a video enabled telemedicine application and verified that I am speaking with the correct person using two identifiers.   Patient location: home Provider location: [place] involved in the visit: patient, provider    I discussed the limitations of evaluation and management by telemedicine and the availability of in person appointments. The patient expressed understanding and agreed to proceed.  HPI  Lipid/Cholesterol, Follow-up:   Last seen for this6 months ago.  Management changes since that visit include none. . Last Lipid Panel: No results found for: CHOL, TRIG, HDL, CHOLHDL, VLDL, LDLCALC, LDLDIRECT  Risk factors for vascular disease include hypercholesterolemia  She reports good compliance with treatment. She is not having side effects.  Current symptoms include none and have been stable. Weight trend: stable Prior visit with dietician: no Current diet: well balanced Current exercise: walking  Wt Readings from Last 3 Encounters:  03/31/19 135 lb 3.2 oz (61.3 kg)  11/04/18 140 lb (63.5 kg)  11/02/18 140 lb 3.2 oz (63.6 kg)    -------------------------------------------------------------------  Migraines Patient states that her migraines has improved and she is still seen the neurology for her migraines.  Hyperglycemia/Prediabetes: trying to maintain low carb diet. Believes last A1c was well controlled   Has been awarded SSI and disability since last seeing me.  Having increased swelling of soft tissue in hands and feet since it started getting warmer. She is also  increasing salt intake and taking Mestinon for her POTS per cardiology   Allergies  Allergen Reactions  . Ioversol Anxiety, Hives, Itching and Palpitations  . Ciprofloxacin Other (See Comments)  . Eggs Or Egg-Derived Products Other (See Comments)    Exacerbates neuralgia symptoms Exacerbates neuralgia symptoms Congestion & Headaches Exacerbates neuralgia symptoms Congestion & Headaches   . Ginger Other (See Comments)    Congestion & Headaches Congestion & Headaches   . Levofloxacin Other (See Comments)    Congestion & Headaches Congestion & Headaches   . Other Other (See Comments)    Novocaine (IV) form Hearing loss Novocaine (IV) form Hearing loss  Congestion & Headaches Congestion & Headaches Congestion & Headaches Congestion & Headaches   . Soybean-Containing Drug Products Other (See Comments)    Congestion & Headaches   . Bee Pollen   . Bupropion   . Celebrex [Celecoxib]   . Codeine   . Dexilant [Dexlansoprazole]   . Dulera [Mometasone Furo-Formoterol Fum]   . Fentanyl   . Fluticasone   . Gabapentin   . Gluten Meal   . Iodine   . Lidocaine     Rash to topical cream  . Lyrica [Pregabalin]   . Mold Extract [Trichophyton]   . Morphine And Related   . Penicillins     Skin testing positive for allergy  . Pollen Extract   . Prilosec [Omeprazole]   . Salmeterol Xinafoate   . Silenor [Doxepin Hcl]   . Tomato   . Hydrocodone Nausea Only  . Ibuprofen Nausea Only    At high doses At high doses   . Lac [place] Diarrhea  Facial burning sensation Facial burning sensation Facial burning sensation Facial burning sensation   . Lanolin Other (See Comments)    Exacerbates neuralgia symptoms Wool causes hives.   . Latex Other (See Comments)    Other Reaction: Other reaction "sensitivity" Other Reaction: Other reaction "sensitivity"   . Milk-Related Compounds Diarrhea    Facial burning sensation   . Wheat Bran Diarrhea     Current Outpatient  Medications:  .  acetaminophen (TYLENOL) 500 MG tablet, Take 500 mg by mouth every 6 (six) hours as needed., Disp: , Rfl:  .  albuterol (PROVENTIL HFA;VENTOLIN HFA) 108 (90 Base) MCG/ACT inhaler, Inhale into the lungs., Disp: , Rfl:  .  amitriptyline (ELAVIL) 10 MG tablet, Take 2 tablets (20 mg total) by mouth at bedtime., Disp: 60 tablet, Rfl: 5 .  Ascorbic Acid (VITAMIN C) 1000 MG tablet, Take 1,000 mg by mouth daily. Up to 1250/qd, Disp: , Rfl:  .  aspirin 325 MG tablet, Take by mouth., Disp: , Rfl:  .  augmented betamethasone dipropionate (DIPROLENE-AF) 0.05 % cream, APPLY TWICE DAILY AS NEEDED TO THE AFFECTED AREAS FOR 5-7 DAYS FOR ECZEMA., Disp: , Rfl:  .  b complex vitamins capsule, Take 1 capsule by mouth daily., Disp: , Rfl:  .  BELSOMRA 5 MG TABS, Take 1 tablet by mouth at bedtime., Disp: , Rfl:  .  betamethasone dipropionate (DIPROLENE) 0.05 % cream, Apply topically as needed. , Disp: , Rfl:  .  Blood Pressure Monitoring (BLOOD PRESSURE CUFF) MISC, Take blood pressure as needed for symptoms., Disp: , Rfl:  .  Boswellia Serrata (BOSWELLIA PO), Take 150 mg by mouth every morning. , Disp: , Rfl:  .  CALCIUM CITRATE PO, Take 400 mg by mouth 2 (two) times daily. , Disp: , Rfl:  .  cromolyn (GASTROCROM) 100 MG/5ML solution, Take by mouth 4 (four) times daily -  before meals and at bedtime., Disp: , Rfl:  .  cycloSPORINE (RESTASIS) 0.05 % ophthalmic emulsion, 1 drop 2 (two) times daily., Disp: , Rfl:  .  diphenhydrAMINE (BENADRYL) 12.5 MG/5ML liquid, Take by mouth 4 (four) times daily as needed., Disp: , Rfl:  .  diphenhydrAMINE-Allantoin 2-0.5 % CREA, Apply topically., Disp: , Rfl:  .  docusate sodium (COLACE) 100 MG capsule, Take 100 mg by mouth daily., Disp: , Rfl:  .  EPINEPHrine 0.3 mg/0.3 mL IJ SOAJ injection, INJECT ONE AUTO-INJECTOR INTRAMUSCULARLY AS NEEDED, Disp: , Rfl:  .  estradiol (ESTRACE) 0.1 MG/GM vaginal cream, Place 1 Applicatorful vaginally once a week., Disp: 42.5 g, Rfl: 2  .  famotidine (PEPCID) 10 MG tablet, Take 10 mg by mouth 2 (two) times daily., Disp: , Rfl:  .  fexofenadine (ALLEGRA) 180 MG tablet, Take 180 mg by mouth daily., Disp: , Rfl:  .  guaifenesin (HUMIBID E) 400 MG TABS tablet, Take 400 mg by mouth daily. , Disp: , Rfl:  .  hydrOXYzine (ATARAX/VISTARIL) 50 MG tablet, Take 50 mg by mouth daily. , Disp: , Rfl:  .  ketotifen (ZADITOR) 0.025 % ophthalmic solution, Administer 1 drop to both eyes Two (2) times a day. And also take 4 drops ORALLY up to 4 times daily as needed., Disp: , Rfl:  .  L-Lysine 500 MG CAPS, Take by mouth 2 (two) times daily., Disp: , Rfl:  .  L-Theanine 100 MG CAPS, Take 200 mg by mouth. , Disp: , Rfl:  .  levocetirizine (XYZAL) 5 MG tablet, Take 5 mg by mouth 2 (two) times daily. ,  Disp: , Rfl:  .  lidocaine (XYLOCAINE) 2 % solution, USE 2 ML INTO EACH NOSTRIL ONCE A DAY AS NEEDED FOR PAIN., Disp: , Rfl:  .  Light Mineral Oil-Mineral Oil 0.5-0.5 % EMUL, Apply to eye., Disp: , Rfl:  .  loperamide (IMODIUM) 2 MG capsule, Take by mouth as needed for diarrhea or loose stools., Disp: , Rfl:  .  Lysine HCl 1000 MG TABS, Take by mouth., Disp: , Rfl:  .  Magnesium Citrate POWD, Take by mouth., Disp: , Rfl:  .  magnesium oxide (MAG-OX) 400 MG tablet, Take 400 mg by mouth daily., Disp: , Rfl:  .  Melatonin 1 MG/ML LIQD, Take by mouth daily. , Disp: , Rfl:  .  montelukast (SINGULAIR) 10 MG tablet, Take 10 mg by mouth at bedtime., Disp: , Rfl:  .  polyethylene glycol (MIRALAX / GLYCOLAX) packet, Take 17 g by mouth daily as needed. , Disp: , Rfl:  .  pseudoephedrine (SUDAFED) 30 MG tablet, Take 30 mg by mouth every 4 (four) hours as needed for congestion., Disp: , Rfl:  .  Psyllium 400 MG CAPS, Take by mouth as needed. , Disp: , Rfl:  .  pyridostigmine (MESTINON) 60 MG tablet, Take by mouth. Pt cuts tablet in quarters and takes 15 mg tablet BID., Disp: , Rfl:  .  simethicone (MYLICON) 80 MG chewable tablet, Chew 80 mg by mouth every 6 (six)  hours as needed for flatulence., Disp: , Rfl:  .  Sodium Fluoride (PREVIDENT 5000 BOOSTER) 1.1 % PSTE, Place onto teeth., Disp: , Rfl:  .  TRIAMCINOLONE ACETONIDE,NASAL, NA, Place into the nose as needed. , Disp: , Rfl:  .  UBRELVY 50 MG TABS, TAKE 1 TABLET BY MOUTH ONCE AS NEEDED FOR UP TO 1 DOSE, Disp: , Rfl:  .  Vitamin D, Cholecalciferol, 10 MCG (400 UNIT) TABS, Take by mouth daily. , Disp: , Rfl:  .  vitamin k 100 MCG tablet, Take 100 mcg by mouth 4 (four) times daily., Disp: , Rfl:  .  BELSOMRA 10 MG TABS, TAKE 10 MG BY MOUTH NIGHTLY AS NEEDED FOR UP TO 30 DAYS, Disp: , Rfl:  .  EMGALITY 120 MG/ML SOSY, every 30 (thirty) days., Disp: , Rfl:  .  ferrous sulfate 325 (65 FE) MG tablet, Take 325 mg by mouth daily with breakfast., Disp: , Rfl:  .  Flaxseed Oil (LINSEED OIL) OIL, Take by mouth., Disp: , Rfl:  .  Flaxseed, Linseed, (FLAXSEED OIL PO), Take 7,640 mg by mouth daily., Disp: , Rfl:  .  rOPINIRole (REQUIP) 0.5 MG tablet, Take by mouth., Disp: , Rfl:   Review of Systems  Constitutional: Negative.   Respiratory: Negative.   Cardiovascular: Negative.   Musculoskeletal: Negative.   Neurological: Negative.   Hematological: Negative.     Social History   Tobacco Use  . Smoking status: Never Smoker  . Smokeless tobacco: Never Used  Substance Use Topics  . Alcohol use: Never    Frequency: Never      Objective:   BP 110/78 (BP Location: Left Arm, Patient Position: Sitting, Cuff Size: Normal)   Pulse 89   SpO2 98%  Vitals:   04/14/19 1329  BP: 110/78  Pulse: 89  SpO2: 98%     Physical Exam Constitutional:      Appearance: Normal appearance.  HENT:     Head: Normocephalic and atraumatic.  Pulmonary:     Effort: Pulmonary effort is normal. No respiratory distress.  Neurological:  Mental Status: She is alert and oriented to person, place, and time. Mental status is at baseline.  Psychiatric:        Mood and Affect: Mood normal.        Behavior: Behavior normal.       No results found for any visits on 04/14/19.     Assessment & Plan    I discussed the assessment and treatment plan with the patient. The patient was provided an opportunity to ask questions and all were answered. The patient agreed with the plan and demonstrated an understanding of the instructions.   The patient was advised to call back or seek an in-person evaluation if the symptoms worsen or if the condition fails to improve as anticipated.  I provided 25 minutes of non-face-to-face time during this encounter.  Problem List Items Addressed This Visit      Cardiovascular and Mediastinum   Chronic migraine with aura    Followed by neurology Well-controlled TMJ dysfunction and EDS contributes some Continue amitriptyline for prophylaxis      POTS (postural orthostatic tachycardia syndrome) - Primary    Stable Followed by cardiology Continue low-dose mestinon - ok to refill this She has been encouraged to increase fluid and salt intake She can continue using compression stockings Discussed warnings about sleeping and needs Prescription for DME will be provided Recheck metabolic panel        Nervous and Auditory   Dysautonomia ([place])    Followed by cardiology, also with pots   encouraged fluid and salt intake No change to medications        Other   Hypercholesterolemia    Not currently on a statin Recheck CMP and fasting lipid panel      Relevant Orders   Lipid panel   Hyperglycemia    Discussed importance of low-carb diet Recheck A1c      Relevant Orders   Hemoglobin A1c   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HCC)    Followed by allergy and immunology She is taking H1 and H2 blockers as well as Singulair to suppress histamine response In the event of hospitalization, she needs Benadryl administered She is also taking sodium, hydroxyzine Has EpiPen available She does have an extensive allergy list      Iron deficiency    Followed and managed by  hematology          Return in about 6 months (around 10/15/2019) for CPE.   The entirety of the information documented in the History of Present Illness, Review of Systems and Physical Exam were personally obtained by me. Portions of this information were initially documented by [facility], CMA and reviewed by me for thoroughness and accuracy.    Bacigalupo, Dionne Bucy, MD MPH [facility]

## 2019-04-14 ENCOUNTER — Encounter: Payer: Self-pay | Admitting: Family Medicine

## 2019-04-14 ENCOUNTER — Telehealth (INDEPENDENT_AMBULATORY_CARE_PROVIDER_SITE_OTHER): Payer: No Typology Code available for payment source | Admitting: Family Medicine

## 2019-04-14 VITALS — BP 110/78 | HR 89

## 2019-04-14 DIAGNOSIS — G43109 Migraine with aura, not intractable, without status migrainosus: Secondary | ICD-10-CM

## 2019-04-14 DIAGNOSIS — D8940 Mast cell activation, unspecified: Secondary | ICD-10-CM

## 2019-04-14 DIAGNOSIS — R000 Tachycardia, unspecified: Secondary | ICD-10-CM

## 2019-04-14 DIAGNOSIS — E7800 Pure hypercholesterolemia, unspecified: Secondary | ICD-10-CM

## 2019-04-14 DIAGNOSIS — R739 Hyperglycemia, unspecified: Secondary | ICD-10-CM

## 2019-04-14 DIAGNOSIS — E611 Iron deficiency: Secondary | ICD-10-CM

## 2019-04-14 DIAGNOSIS — G901 Familial dysautonomia [Riley-Day]: Secondary | ICD-10-CM

## 2019-04-14 DIAGNOSIS — G90A Postural orthostatic tachycardia syndrome (POTS): Secondary | ICD-10-CM

## 2019-04-14 DIAGNOSIS — I498 Other specified cardiac arrhythmias: Secondary | ICD-10-CM

## 2019-04-14 DIAGNOSIS — I951 Orthostatic hypotension: Secondary | ICD-10-CM

## 2019-04-14 DIAGNOSIS — G43E09 Chronic migraine with aura, not intractable, without status migrainosus: Secondary | ICD-10-CM

## 2019-04-14 MED ORDER — LEVOCETIRIZINE DIHYDROCHLORIDE 5 MG PO TABS: 5.0000 mg | ORAL_TABLET | Freq: Two times a day (BID) | ORAL | 3 refills | Status: DC

## 2019-04-14 MED ORDER — CYCLOSPORINE 0.05 % OP EMUL: 1.0000 [drp] | Freq: Two times a day (BID) | OPHTHALMIC | 3 refills | Status: DC

## 2019-04-14 NOTE — Assessment & Plan Note (Signed)
Followed by cardiology, also with pots   encouraged fluid and salt intake No change to medications

## 2019-04-14 NOTE — Assessment & Plan Note (Signed)
Followed by allergy and immunology She is taking H1 and H2 blockers as well as Singulair to suppress histamine response In the event of hospitalization, she needs Benadryl administered She is also taking sodium, hydroxyzine Has EpiPen available She does have an extensive allergy list

## 2019-04-14 NOTE — Assessment & Plan Note (Signed)
Stable Followed by cardiology Continue low-dose mestinon - ok to refill this She has been encouraged to increase fluid and salt intake She can continue using compression stockings Discussed warnings about sleeping and needs Prescription for DME will be provided Recheck metabolic panel

## 2019-04-14 NOTE — Assessment & Plan Note (Addendum)
Followed and managed by hematology

## 2019-04-14 NOTE — Assessment & Plan Note (Signed)
Not currently on a statin Recheck CMP and fasting lipid panel

## 2019-04-14 NOTE — Assessment & Plan Note (Signed)
Followed by neurology Well-controlled TMJ dysfunction and EDS contributes some Continue amitriptyline for prophylaxis

## 2019-04-14 NOTE — Assessment & Plan Note (Signed)
Discussed importance of low-carb diet Recheck A1c

## 2019-04-20 ENCOUNTER — Telehealth: Payer: Self-pay | Admitting: *Deleted

## 2019-04-20 ENCOUNTER — Other Ambulatory Visit: Payer: Self-pay | Admitting: *Deleted

## 2019-04-20 DIAGNOSIS — E7800 Pure hypercholesterolemia, unspecified: Secondary | ICD-10-CM

## 2019-04-20 DIAGNOSIS — R7303 Prediabetes: Secondary | ICD-10-CM

## 2019-04-20 NOTE — Telephone Encounter (Signed)
Pt. Called to see if Dr. Janese Banks could draw labs for Dr. Brita Romp her PCP to be done here so she does not have 2 sticks in same week. Dr. Janese Banks states that it is find. I sent message to her PCP and she states that she wants lipid profile and hgb a1c.  I have ordered them and Janese Banks is fine with that. The pt. Wants to change her appt for labs to earlier in am since I have told her that she needs to be NPO for 8 hours prior for the tests. I changed the appt. . The patient would also wants to see if Dr. Janese Banks  Would be ok with video visit the next week after her labs instead of coming in office to see Dr. Rao-Dr. Janese Banks said yes and it was changed to video visit and pt told instructions for video calls.

## 2019-04-21 ENCOUNTER — Telehealth: Payer: Self-pay | Admitting: *Deleted

## 2019-04-21 NOTE — Telephone Encounter (Signed)
I don't think the vaccine comes out until Sept or October

## 2019-04-21 NOTE — Telephone Encounter (Signed)
I called pt back after I spoke to [place] in pharmacy and she states we usually don't have them in august. We usually get them sometime in sept. Pt. Is aware

## 2019-04-21 NOTE — Telephone Encounter (Signed)
Are we giving flu vaccines here yet? If we are she can get it

## 2019-04-21 NOTE — Telephone Encounter (Signed)
Patient called stating she got notification from PCP that her flu vaccine is due and is asking if Dr Janese Banks would let her get it when she comes in for her lab appointment on the 18 th. Please advise

## 2019-04-23 ENCOUNTER — Other Ambulatory Visit: Payer: Self-pay | Admitting: Student in an Organized Health Care Education/Training Program

## 2019-04-25 ENCOUNTER — Encounter (INDEPENDENT_AMBULATORY_CARE_PROVIDER_SITE_OTHER): Payer: Self-pay | Admitting: Family Medicine

## 2019-04-25 ENCOUNTER — Other Ambulatory Visit (INDEPENDENT_AMBULATORY_CARE_PROVIDER_SITE_OTHER): Payer: Self-pay

## 2019-04-25 ENCOUNTER — Encounter: Payer: Self-pay | Admitting: Student in an Organized Health Care Education/Training Program

## 2019-04-25 ENCOUNTER — Other Ambulatory Visit: Payer: Self-pay | Admitting: Student in an Organized Health Care Education/Training Program

## 2019-04-25 DIAGNOSIS — G8929 Other chronic pain: Secondary | ICD-10-CM

## 2019-04-25 DIAGNOSIS — Q796 Ehlers-Danlos syndrome, unspecified: Secondary | ICD-10-CM

## 2019-04-25 DIAGNOSIS — G4700 Insomnia, unspecified: Secondary | ICD-10-CM

## 2019-04-25 MED ORDER — AMITRIPTYLINE HCL 10 MG PO TABS: 10.0000 mg | ORAL_TABLET | Freq: Every day | ORAL | 5 refills | Status: DC

## 2019-04-25 NOTE — Progress Notes (Signed)
Side effects with increased Amitriptyline dose (tinnitus), reduce back to 10 mg daily.  Requested Prescriptions   Signed Prescriptions Disp Refills  . amitriptyline (ELAVIL) 10 MG tablet 60 tablet 5    Sig: Take 1 tablet (10 mg total) by mouth at bedtime.

## 2019-04-27 ENCOUNTER — Other Ambulatory Visit: Payer: Self-pay | Admitting: Obstetrics and Gynecology

## 2019-04-27 DIAGNOSIS — N898 Other specified noninflammatory disorders of vagina: Secondary | ICD-10-CM

## 2019-04-27 NOTE — Telephone Encounter (Signed)
Please advise 

## 2019-04-28 ENCOUNTER — Encounter: Payer: Self-pay | Admitting: Family Medicine

## 2019-05-02 ENCOUNTER — Telehealth: Payer: Self-pay | Admitting: Family Medicine

## 2019-05-02 ENCOUNTER — Other Ambulatory Visit: Payer: Self-pay

## 2019-05-02 DIAGNOSIS — Z20822 Contact with and (suspected) exposure to covid-19: Secondary | ICD-10-CM

## 2019-05-02 DIAGNOSIS — J029 Acute pharyngitis, unspecified: Secondary | ICD-10-CM

## 2019-05-02 NOTE — Telephone Encounter (Signed)
Please advise 

## 2019-05-02 NOTE — Telephone Encounter (Signed)
Is appropriate to COVID test.  No order needed, but we can put in [id number].  She and her husband can go to drive up site and be tested.  As she is symptomatic, even if test is negative, she should quarantine for 10 days from start of symptoms.

## 2019-05-02 NOTE — Telephone Encounter (Signed)
Advised patient as below. Order was placed as well.

## 2019-05-02 NOTE — Telephone Encounter (Signed)
Pt has had a sore throat that won't go away.  Pt is needing to know if she will need a Covid test. Wants to know if an order could be placed for her husband as well.  She says her husband gave her the sore throat initially.  Please advise.  Thanks, [employer]

## 2019-05-03 ENCOUNTER — Inpatient Hospital Stay: Payer: No Typology Code available for payment source

## 2019-05-03 ENCOUNTER — Other Ambulatory Visit: Payer: No Typology Code available for payment source

## 2019-05-04 LAB — NOVEL CORONAVIRUS, NAA: SARS-CoV-2, NAA: NOT DETECTED

## 2019-05-07 ENCOUNTER — Other Ambulatory Visit: Payer: Self-pay | Admitting: Gastroenterology

## 2019-05-07 DIAGNOSIS — R140 Abdominal distension (gaseous): Secondary | ICD-10-CM

## 2019-05-09 ENCOUNTER — Inpatient Hospital Stay: Payer: No Typology Code available for payment source | Admitting: Oncology

## 2019-05-09 ENCOUNTER — Other Ambulatory Visit: Payer: No Typology Code available for payment source

## 2019-05-14 ENCOUNTER — Encounter: Payer: Self-pay | Admitting: Family Medicine

## 2019-05-16 ENCOUNTER — Other Ambulatory Visit: Payer: Self-pay

## 2019-05-17 ENCOUNTER — Other Ambulatory Visit: Payer: Self-pay

## 2019-05-17 ENCOUNTER — Inpatient Hospital Stay: Payer: No Typology Code available for payment source | Attending: Oncology

## 2019-05-17 DIAGNOSIS — E7800 Pure hypercholesterolemia, unspecified: Secondary | ICD-10-CM

## 2019-05-17 DIAGNOSIS — Z85038 Personal history of other malignant neoplasm of large intestine: Secondary | ICD-10-CM | POA: Diagnosis not present

## 2019-05-17 DIAGNOSIS — D509 Iron deficiency anemia, unspecified: Secondary | ICD-10-CM | POA: Insufficient documentation

## 2019-05-17 DIAGNOSIS — R7303 Prediabetes: Secondary | ICD-10-CM

## 2019-05-17 LAB — LIPID PANEL
Cholesterol: 179 mg/dL (ref 0–200)
HDL: 70 mg/dL (ref >40)
LDL Cholesterol: 104 mg/dL — ABNORMAL HIGH (ref 0–99)
Total CHOL/HDL Ratio: 2.6 RATIO
Triglycerides: 25 mg/dL (ref <150)
VLDL: 5 mg/dL (ref 0–40)

## 2019-05-17 LAB — VITAMIN B12: Vitamin B-12: 559 pg/mL (ref 180–914)

## 2019-05-17 LAB — CBC
HCT: 39.3 % (ref 36.0–46.0)
Hemoglobin: 12.7 g/dL (ref 12.0–15.0)
MCH: 30.3 pg (ref 26.0–34.0)
MCHC: 32.3 g/dL (ref 30.0–36.0)
MCV: 93.8 fL (ref 80.0–100.0)
Platelets: 298 10*3/uL (ref 150–400)
RBC: 4.19 MIL/uL (ref 3.87–5.11)
RDW: 12.3 % (ref 11.5–15.5)
WBC: 6.3 10*3/uL (ref 4.0–10.5)
nRBC: 0 % (ref 0.0–0.2)

## 2019-05-17 LAB — HEMOGLOBIN A1C
Hgb A1c MFr Bld: 5.4 % (ref 4.8–5.6)
Mean Plasma Glucose: 108.28 mg/dL

## 2019-05-17 LAB — IRON AND TIBC
Iron: 85 ug/dL (ref 28–170)
Saturation Ratios: 25 % (ref 10.4–31.8)
TIBC: 343 ug/dL (ref 250–450)
UIBC: 258 ug/dL

## 2019-05-17 LAB — FERRITIN: Ferritin: 216 ng/mL (ref 11–307)

## 2019-05-24 ENCOUNTER — Other Ambulatory Visit: Payer: Self-pay | Admitting: Gastroenterology

## 2019-05-24 ENCOUNTER — Encounter: Payer: Self-pay | Admitting: Obstetrics and Gynecology

## 2019-05-24 ENCOUNTER — Other Ambulatory Visit: Payer: Self-pay | Admitting: *Deleted

## 2019-05-24 ENCOUNTER — Other Ambulatory Visit: Payer: Self-pay | Admitting: Obstetrics and Gynecology

## 2019-05-24 ENCOUNTER — Inpatient Hospital Stay (HOSPITAL_BASED_OUTPATIENT_CLINIC_OR_DEPARTMENT_OTHER): Payer: No Typology Code available for payment source | Admitting: Oncology

## 2019-05-24 ENCOUNTER — Encounter: Payer: Self-pay | Admitting: Family Medicine

## 2019-05-24 DIAGNOSIS — R140 Abdominal distension (gaseous): Secondary | ICD-10-CM

## 2019-05-24 DIAGNOSIS — D509 Iron deficiency anemia, unspecified: Secondary | ICD-10-CM

## 2019-05-24 DIAGNOSIS — N898 Other specified noninflammatory disorders of vagina: Secondary | ICD-10-CM

## 2019-05-24 NOTE — Telephone Encounter (Signed)
Please advise 

## 2019-05-25 ENCOUNTER — Other Ambulatory Visit: Payer: Self-pay | Admitting: Obstetrics and Gynecology

## 2019-05-25 DIAGNOSIS — Z1273 Encounter for screening for malignant neoplasm of ovary: Secondary | ICD-10-CM

## 2019-05-25 DIAGNOSIS — Z85038 Personal history of other malignant neoplasm of large intestine: Secondary | ICD-10-CM

## 2019-05-25 NOTE — Telephone Encounter (Signed)
Order in. Thx.

## 2019-05-25 NOTE — Telephone Encounter (Signed)
Patient is schedule for GYN u/s and annual for 08/29/19. Could you please place ultrasound order. Thank you

## 2019-05-26 ENCOUNTER — Encounter: Payer: Self-pay | Admitting: Family Medicine

## 2019-05-26 ENCOUNTER — Encounter: Payer: Self-pay | Admitting: Oncology

## 2019-05-26 MED ORDER — LEVOCETIRIZINE DIHYDROCHLORIDE 5 MG PO TABS: 5.0000 mg | ORAL_TABLET | Freq: Two times a day (BID) | ORAL | 3 refills | Status: DC

## 2019-05-26 NOTE — Progress Notes (Signed)
I connected with Autumn Zhang on 05/26/19 at  2:30 PM EDT by video enabled telemedicine visit and verified that I am speaking with the correct person using two identifiers.   I discussed the limitations, risks, security and privacy concerns of performing an evaluation and management service by telemedicine and the availability of in-person appointments. I also discussed with the patient that there may be a patient responsible charge related to this service. The patient expressed understanding and agreed to proceed.  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 She underwent right hemicolectomy which revealed poorly differentiated invasive adenocarcinoma T3N0. 0 out of 34 lymph nodes were positive. She received 12 cycles of adjuvant chemotherapy with 5-FU leucovorin. No oxaliplatin. 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 Her only brother has also had colon polyps. She has therefore been managed as a Lynch syndrome patient despite not having genetic features suggestive of Lynch syndrome. She took prophylactic aspirin for a while and then she stopped taking it. She has been getting colonoscopies every 1 to 2 years. She has also had capsule endoscopy in 2014 for iron deficiency anemia that was normal. She has not required any surveillance scans as she has been more than 5 years since her diagnosis of colon cancer. She has established care with Dr. Marius Ditch for this.  Other than that patient has a history of basal cell skin cancer x2 and is in the process of finding a dermatologist. She was also getting yearly  pelvic exams and pelvic ultrasound to screen for GYN cancers. Patient has a history of mast cell activation syndrome and often needs Benadryl before taking oral iron. Also reports that she has dysautonomia and pots for which she sees cardiology. Also has a h/o Ehler Danlos syndrome  Currently patient has an ongoing migraine attack. She is wearing dark glasses and a cat and unable to see eye to eye. She denies any blood in her stool or urine presently. Denies any dark melanotic stools. Her last IV iron was about 6 months ago and she does not remember what type of IV iron she has received.   She sees Dr. Esperanza Sheets from [facility] for mast cell activation syndrome   Interval history she has occasional flare ups from her mast cell activation syndrome. She will also be undergoing upper endoscopy at [facility] with a pediatric endoscope.   Review of Systems  Constitutional: Negative for chills, fever, malaise/fatigue and weight loss.  HENT: Negative for congestion, ear discharge and nosebleeds.   Eyes: Negative for blurred vision.  Respiratory: Negative for cough, hemoptysis, sputum production, shortness of breath and wheezing.   Cardiovascular: Negative for chest pain, palpitations, orthopnea and claudication.  Gastrointestinal: Negative for abdominal pain, blood in stool, constipation, diarrhea, heartburn, melena, nausea and vomiting.  Genitourinary: Negative for dysuria, flank pain, frequency, hematuria and urgency.  Musculoskeletal: Negative for back pain, joint pain and myalgias.  Skin: Negative for rash.  Neurological: Negative for dizziness, tingling, focal weakness, seizures, weakness and headaches.  Endo/Heme/Allergies: Does not bruise/bleed easily.  Psychiatric/Behavioral: Negative for depression and suicidal ideas. The patient does not have insomnia.  Allergies  Allergen Reactions  . Ioversol Anxiety, Hives, Itching and Palpitations  . Ciprofloxacin Other (See Comments)  . Eggs Or  Egg-Derived Products Other (See Comments)    Exacerbates neuralgia symptoms Exacerbates neuralgia symptoms Congestion & Headaches Exacerbates neuralgia symptoms Congestion & Headaches   . Ginger Other (See Comments)    Congestion & Headaches Congestion & Headaches   . Levofloxacin Other (See Comments)    Congestion & Headaches Congestion & Headaches   . Other Other (See Comments)    Novocaine (IV) form Hearing loss Novocaine (IV) form Hearing loss  Congestion & Headaches Congestion & Headaches Congestion & Headaches Congestion & Headaches   . Soybean-Containing Drug Products Other (See Comments)    Congestion & Headaches   . Bee Pollen   . Bupropion   . Celebrex [Celecoxib]   . Codeine   . Dexilant [Dexlansoprazole]   . Dulera [Mometasone Furo-Formoterol Fum]   . Fentanyl   . Fluticasone   . Gabapentin   . Gluten Meal   . Iodine   . Lidocaine     Rash to topical cream  . Lyrica [Pregabalin]   . Mold Extract [Trichophyton]   . Morphine And Related   . Penicillins     Skin testing positive for allergy  . Pollen Extract   . Prilosec [Omeprazole]   . Rizatriptan Other (See Comments)    Dizziness, headache, myalgias, nausea  . Salmeterol Xinafoate   . Silenor [Doxepin Hcl]   . Tomato   . Hydrocodone Nausea Only  . Ibuprofen Nausea Only    At high doses At high doses   . Lac Bovis Diarrhea    Facial burning sensation Facial burning sensation Facial burning sensation Facial burning sensation   . Lanolin Other (See Comments)    Exacerbates neuralgia symptoms Wool causes hives.   . Latex Other (See Comments)    Other Reaction: Other reaction "sensitivity" Other Reaction: Other reaction "sensitivity"   . Milk-Related Compounds Diarrhea    Facial burning sensation   . Wheat Bran Diarrhea    Past Medical History:  Diagnosis Date  . Allergy   . Anemia   . Anxiety   . Arthritis   . Asthma   . Basal cell carcinoma   . BRCA negative 09/2018    2015 [place] at [facility]; has VUS; 1/20 MyRisk neg except RPS20 VUS; IBIS=10.8%/riskscore=12%  . Colon cancer ([place])   . Dysautonomia ([place])   . Ehlers-Danlos syndrome type III   . Family history of colon cancer   . GERD (gastroesophageal reflux disease)   . Hyperlipidemia   . Idiopathic small fiber peripheral neuropathy   . Leiomyoma   . Mast cell activation ([place])   . Migraines   . Neck pain   . Occipital neuralgia   . Sleep apnea     Past Surgical History:  Procedure Laterality Date  . APPENDECTOMY     removed with colectomy  . BREAST SURGERY     reduction  . COLON SURGERY  2008   for colon cancer, ~1/3 of colon removed, primary reanastimosis  . COLONOSCOPY WITH PROPOFOL N/A 10/07/2018   Procedure: COLONOSCOPY WITH PROPOFOL;  Surgeon: Lin Landsman, MD;  Location: [facility] ENDOSCOPY;  Service: Gastroenterology;  Laterality: N/A;  . ENDOMETRIAL ABLATION      Social History   Socioeconomic History  . Marital status: Married    Spouse name: Not on file  . Number of children: 0  . Years of education: Not on file  .  Highest education level: Not on file  Occupational History  . Occupation: SSI and disability  Social Needs  . Financial resource strain: Not on file  . Food insecurity    Worry: Not on file    Inability: Not on file  . Transportation needs    Medical: Not on file    Non-medical: Not on file  Tobacco Use  . Smoking status: Never Smoker  . Smokeless tobacco: Never Used  Substance and Sexual Activity  . Alcohol use: Never    Frequency: Never  . Drug use: Never  . Sexual activity: Not Currently  Lifestyle  . Physical activity    Days per week: Not on file    Minutes per session: Not on file  . Stress: Not on file  Relationships  . Social Herbalist on phone: Not on file    Gets together: Not on file    Attends religious service: Not on file    Active member of club or organization: Not on file    Attends meetings of clubs or organizations: Not  on file    Relationship status: Not on file  . Intimate partner violence    Fear of current or ex partner: Not on file    Emotionally abused: Not on file    Physically abused: Not on file    Forced sexual activity: Not on file  Other Topics Concern  . Not on file  Social History Narrative  . Not on file    Family History  Problem Relation Age of Onset  . Colon cancer Father 37  . Breast cancer Maternal Aunt 50       BRCA neg  . Endometrial cancer Maternal Aunt 50  . Colon cancer Maternal Grandmother 33  . Migraines Maternal Grandmother   . Tremor Maternal Grandmother   . Leukemia Maternal Grandfather 35  . Food Allergy Maternal Grandfather   . Migraines Mother   . Glaucoma Mother   . Cataracts Mother   . Tremor Mother   . Fibromyalgia Mother   . Heart disease Mother   . Colon polyps Brother        non-cancerous     Current Outpatient Medications:  .  augmented betamethasone dipropionate (DIPROLENE-AF) 0.05 % cream, APPLY TWICE DAILY AS NEEDED TO THE AFFECTED AREAS FOR 5-7 DAYS FOR ECZEMA., Disp: , Rfl:  .  b complex vitamins capsule, Take 1 capsule by mouth daily., Disp: , Rfl:  .  BELSOMRA 5 MG TABS, Take 1 tablet by mouth at bedtime., Disp: , Rfl:  .  Blood Pressure Monitoring (BLOOD PRESSURE CUFF) MISC, Take blood pressure as needed for symptoms., Disp: , Rfl:  .  Boswellia Serrata (BOSWELLIA PO), Take 150 mg by mouth every morning. , Disp: , Rfl:  .  CALCIUM CITRATE PO, Take 400 mg by mouth 2 (two) times daily. , Disp: , Rfl:  .  CREON 6000 units CPEP, TAKE 2 CAPSULES WITH EACH MEAL AND TAKE 1 CAPSULE WITH EACH SNACK, Disp: 120 capsule, Rfl: 1 .  cromolyn (GASTROCROM) 100 MG/5ML solution, Take by mouth 4 (four) times daily -  before meals and at bedtime., Disp: , Rfl:  .  cycloSPORINE (RESTASIS) 0.05 % ophthalmic emulsion, Place 1 drop into both eyes 2 (two) times daily., Disp: 0.4 mL, Rfl: 3 .  diphenhydrAMINE (BENADRYL) 12.5 MG/5ML liquid, Take by mouth 4 (four)  times daily as needed., Disp: , Rfl:  .  diphenhydrAMINE-Allantoin 2-0.5 % CREA, Apply 1 application  topically as needed. , Disp: , Rfl:  .  docusate sodium (COLACE) 100 MG capsule, Take 100 mg by mouth as needed. , Disp: , Rfl:  .  EPINEPHrine 0.3 mg/0.3 mL IJ SOAJ injection, INJECT ONE AUTO-INJECTOR INTRAMUSCULARLY AS NEEDED, Disp: , Rfl:  .  estradiol (ESTRACE) 0.1 MG/GM vaginal cream, PLACE 1 APPLICATORFUL VAGINALLY 2 (TWO) TIMES A WEEK., Disp: 42.5 g, Rfl: 0 .  famotidine (PEPCID) 10 MG tablet, Take 10 mg by mouth daily. , Disp: , Rfl:  .  fexofenadine (ALLEGRA) 180 MG tablet, Take 180 mg by mouth daily as needed. , Disp: , Rfl:  .  guaifenesin (HUMIBID E) 400 MG TABS tablet, Take 400 mg by mouth daily. , Disp: , Rfl:  .  hydrOXYzine (ATARAX/VISTARIL) 50 MG tablet, Take 50 mg by mouth daily. , Disp: , Rfl:  .  ketotifen (ZADITOR) 0.025 % ophthalmic solution, Administer 1 drop to both eyes Two (2) times a day. And also take 4 drops ORALLY up to 4 times daily as needed., Disp: , Rfl:  .  L-Lysine 500 MG CAPS, Take by mouth 2 (two) times daily., Disp: , Rfl:  .  L-Theanine 100 MG CAPS, Take 200 mg by mouth daily. , Disp: , Rfl:  .  Light Mineral Oil-Mineral Oil 0.5-0.5 % EMUL, Apply 1 application to eye daily. , Disp: , Rfl:  .  loperamide (IMODIUM) 2 MG capsule, Take by mouth as needed for diarrhea or loose stools., Disp: , Rfl:  .  Magnesium Citrate POWD, Take 435 mg by mouth daily. , Disp: , Rfl:  .  Melatonin 1 MG/ML LIQD, Take by mouth daily. , Disp: , Rfl:  .  montelukast (SINGULAIR) 10 MG tablet, Take 10 mg by mouth daily. , Disp: , Rfl:  .  polyethylene glycol (MIRALAX / GLYCOLAX) packet, Take 17 g by mouth daily as needed. , Disp: , Rfl:  .  pseudoephedrine (SUDAFED) 30 MG tablet, Take 30 mg by mouth every 4 (four) hours as needed for congestion., Disp: , Rfl:  .  Psyllium 400 MG CAPS, Take by mouth as needed. , Disp: , Rfl:  .  simethicone (MYLICON) 80 MG chewable tablet, Chew 80 mg  by mouth every 6 (six) hours as needed for flatulence., Disp: , Rfl:  .  Sodium Fluoride (PREVIDENT 5000 BOOSTER) 1.1 % PSTE, Place onto teeth., Disp: , Rfl:  .  TRIAMCINOLONE ACETONIDE,NASAL, NA, Place into the nose as needed. , Disp: , Rfl:  .  UBRELVY 50 MG TABS, TAKE 1 TABLET BY MOUTH ONCE AS NEEDED FOR UP TO 1 DOSE, Disp: , Rfl:  .  Vitamin D, Cholecalciferol, 10 MCG (400 UNIT) TABS, Take by mouth daily. , Disp: , Rfl:  .  vitamin k 100 MCG tablet, Take 100 mcg by mouth every other day. , Disp: , Rfl:  .  ZENPEP 10000-32000 units CPEP, TAKE 1 CAPSULE BY MOUTH 3 TIMES A DAY WITH MEALS AS NEEDED, Disp: 270 capsule, Rfl: 0 .  acetaminophen (TYLENOL) 500 MG tablet, Take 500 mg by mouth every 6 (six) hours as needed., Disp: , Rfl:  .  albuterol (PROVENTIL HFA;VENTOLIN HFA) 108 (90 Base) MCG/ACT inhaler, Inhale into the lungs., Disp: , Rfl:  .  amitriptyline (ELAVIL) 10 MG tablet, Take 1 tablet (10 mg total) by mouth at bedtime., Disp: 60 tablet, Rfl: 5 .  Ascorbic Acid (VITAMIN C) 1000 MG tablet, Take 1,000 mg by mouth daily. Up to 1250/qd, Disp: , Rfl:  .  aspirin 325  MG tablet, Take by mouth., Disp: , Rfl:  .  levocetirizine (XYZAL) 5 MG tablet, Take 1 tablet (5 mg total) by mouth 2 (two) times daily., Disp: 180 tablet, Rfl: 3  No results found.  No images are attached to the encounter.   CMP Latest Ref Rng & Units 03/31/2019  Glucose 65 - 99 mg/dL 94  BUN 6 - 24 mg/dL 9  Creatinine 0.57 - 1.00 mg/dL 0.56(L)  Sodium 134 - 144 mmol/L 143  Potassium 3.5 - 5.2 mmol/L 4.2  Chloride 96 - 106 mmol/L 104  Calcium 8.7 - 10.2 mg/dL 9.7  Total Protein 6.0 - 8.5 g/dL 6.9  Total Bilirubin 0.0 - 1.2 mg/dL 0.5  Alkaline Phos 39 - 117 IU/L 86  AST 0 - 40 IU/L 20  ALT 0 - 32 IU/L 17   CBC Latest Ref Rng & Units 05/17/2019  WBC 4.0 - 10.5 K/uL 6.3  Hemoglobin 12.0 - 15.0 g/dL 12.7  Hematocrit 36.0 - 46.0 % 39.3  Platelets 150 - 400 K/uL 298     Observation/objective: Appears in no acute  distress of a video visit today.  Breathing is nonlabored  Assessment and plan: Patient is a 48 year old female with following issues:  1.  Iron deficiency anemia: Patient has not been overtly anemic in the past but had a low ferritin for which she received Venofer.  Currently her hemoglobin is normal and iron studies are normal.  We will continue to monitor her iron studies every 3 months.  I will see her back in 6 months.  She is undergoing upper endoscopy at [facility] as well.  2.  Personal history of colon cancer back in 2009.  She does not require any follow-up for this at this time.  She does have a history of ovarian cyst and will be getting pelvic ultrasound through GYN  Follow-up instructions:  I discussed the assessment and treatment plan with the patient. The patient was provided an opportunity to ask questions and all were answered. The patient agreed with the plan and demonstrated an understanding of the instructions.   The patient was advised to call back or seek an in-person evaluation if the symptoms worsen or if the condition fails to improve as anticipated.    Visit Diagnosis: 1. Iron deficiency anemia, unspecified iron deficiency anemia type     Dr. Randa Evens, MD, MPH [facility] [phone] 05/26/2019 1:09 PM

## 2019-06-22 ENCOUNTER — Encounter: Payer: Self-pay | Admitting: Physician Assistant

## 2019-06-22 ENCOUNTER — Ambulatory Visit (INDEPENDENT_AMBULATORY_CARE_PROVIDER_SITE_OTHER): Payer: No Typology Code available for payment source | Admitting: Physician Assistant

## 2019-06-22 ENCOUNTER — Other Ambulatory Visit: Payer: Self-pay

## 2019-06-22 ENCOUNTER — Encounter: Payer: Self-pay | Admitting: Family Medicine

## 2019-06-22 VITALS — BP 117/79 | HR 87 | Temp 96.9°F | Resp 15 | Wt 131.2 lb

## 2019-06-22 DIAGNOSIS — I498 Other specified cardiac arrhythmias: Secondary | ICD-10-CM | POA: Diagnosis not present

## 2019-06-22 DIAGNOSIS — R42 Dizziness and giddiness: Secondary | ICD-10-CM | POA: Diagnosis not present

## 2019-06-22 DIAGNOSIS — G901 Familial dysautonomia [Riley-Day]: Secondary | ICD-10-CM

## 2019-06-22 DIAGNOSIS — G90A Postural orthostatic tachycardia syndrome (POTS): Secondary | ICD-10-CM

## 2019-06-22 MED ORDER — MECLIZINE HCL 12.5 MG PO TABS: 12.5000 mg | ORAL_TABLET | Freq: Three times a day (TID) | ORAL | 0 refills | Status: DC | PRN

## 2019-06-22 MED ORDER — PROMETHAZINE HCL 12.5 MG PO TABS: 12.5000 mg | ORAL_TABLET | Freq: Three times a day (TID) | ORAL | 0 refills | Status: DC | PRN

## 2019-06-22 NOTE — Progress Notes (Signed)
Patient: Autumn Zhang Female    DOB: June 15, 1971   48 y.o.   MRN: [id number] Visit Date: 06/22/2019  Today's Provider: Trinna Post, PA-C   Chief Complaint  Patient presents with  . Dizziness   Subjective:   Patient with a history of migraine with aura, POTS, dysautonomia presenting today with dizziness ongoing for several days. She had a Zolera injection at [facility] on 06/17/2019 and reports she had an anaphylactic reaction. She had several injections of epinephrine and most recently her allergist told her if her symptoms didn't improve after the most recent epinephrine injection, then it was not mast cell related. She reports a room spinning sensation when she moves her head. She feels nausea with this but not vomiting. She does not feel dizziness with positional changes as she had in the past with her other conditions. She has had vertigo episodes prior to this.   Dizziness This is a new problem. The current episode started in the past 7 days. The problem occurs intermittently. The problem has been waxing and waning. Associated symptoms include nausea, urinary symptoms and vertigo. Pertinent negatives include no abdominal pain, anorexia, arthralgias, change in bowel habit, chest pain, chills, congestion, coughing, diaphoresis, fatigue, fever, headaches, joint swelling, myalgias, neck pain, numbness, rash, sore throat, swollen glands, visual change, vomiting or weakness. The symptoms are aggravated by bending. Treatments tried: benadryl. The treatment provided no relief.   CBC Latest Ref Rng & Units 05/17/2019 02/02/2019 11/04/2018  WBC 4.0 - 10.5 K/uL 6.3 6.1 6.8  Hemoglobin 12.0 - 15.0 g/dL 12.7 12.8 12.5  Hematocrit 36.0 - 46.0 % 39.3 40.5 38.6  Platelets 150 - 400 K/uL 298 290 304    BP Readings from Last 3 Encounters:  06/22/19 117/79  04/14/19 110/78  03/31/19 119/71     Allergies  Allergen Reactions  . Ioversol Anxiety, Hives, Itching and Palpitations  .  Ciprofloxacin Other (See Comments)  . Eggs Or Egg-Derived Products Other (See Comments)    Exacerbates neuralgia symptoms Exacerbates neuralgia symptoms Congestion & Headaches Exacerbates neuralgia symptoms Congestion & Headaches   . Ginger Other (See Comments)    Congestion & Headaches Congestion & Headaches   . Levofloxacin Other (See Comments)    Congestion & Headaches Congestion & Headaches   . Other Other (See Comments)    Novocaine (IV) form Hearing loss Novocaine (IV) form Hearing loss  Congestion & Headaches Congestion & Headaches Congestion & Headaches Congestion & Headaches   . Soybean-Containing Drug Products Other (See Comments)    Congestion & Headaches   . Bee Pollen   . Bupropion   . Celebrex [Celecoxib]   . Codeine   . Dexilant [Dexlansoprazole]   . Dulera [Mometasone Furo-Formoterol Fum]   . Fentanyl   . Fluticasone   . Gabapentin   . Gluten Meal   . Iodine   . Lidocaine     Rash to topical cream  . Lyrica [Pregabalin]   . Mold Extract [Trichophyton]   . Morphine And Related   . Penicillins     Skin testing positive for allergy  . Pollen Extract   . Prilosec [Omeprazole]   . Rizatriptan Other (See Comments)    Dizziness, headache, myalgias, nausea  . Salmeterol Xinafoate   . Silenor [Doxepin Hcl]   . Tomato   . Hydrocodone Nausea Only  . Ibuprofen Nausea Only    At high doses At high doses   . Lac Bovis Diarrhea    Facial  burning sensation Facial burning sensation Facial burning sensation Facial burning sensation   . Lanolin Other (See Comments)    Exacerbates neuralgia symptoms Wool causes hives.   . Latex Other (See Comments)    Other Reaction: Other reaction "sensitivity" Other Reaction: Other reaction "sensitivity"   . Milk-Related Compounds Diarrhea    Facial burning sensation   . Wheat Bran Diarrhea     Current Outpatient Medications:  .  Alpha-Lipoic Acid 600 MG TABS, Take by mouth., Disp: , Rfl:  .  econazole  nitrate 1 % cream, APPLY TO FEET TWICE A DAY FOR 2 4 WEEKS AS NEEDED FOR FLARE, Disp: , Rfl:  .  Ketotifen Fumarate POWD, Take by mouth., Disp: , Rfl:  .  NALTREXONE HCL PO, 0.25 mg., Disp: , Rfl:  .  Nerve Stimulator (CEFALY KIT) DEVI, , Disp: , Rfl:  .  NON FORMULARY, Low-dose naltrexone 1x/day. 0.33m/tablet for 1 month, 0.5 mg/tablet 1 month, 19mtablet 1 month, 2 mg/tablet ongoing., Disp: , Rfl:  .  omalizumab (XOLAIR) 150 MG/ML prefilled syringe, Inject into the skin., Disp: , Rfl:  .  omega-3 acid ethyl esters (LOVAZA) 1 g capsule, 4 g., Disp: , Rfl:  .  VIMPAT 50 MG TABS tablet, TAKE 1 TABLET (50 MG TOTAL) BY MOUTH 2 (TWO) TIMES DAILY FOR 180 DAYS, Disp: , Rfl:  .  acetaminophen (TYLENOL) 500 MG tablet, Take 500 mg by mouth every 6 (six) hours as needed., Disp: , Rfl:  .  albuterol (PROVENTIL HFA;VENTOLIN HFA) 108 (90 Base) MCG/ACT inhaler, Inhale into the lungs., Disp: , Rfl:  .  amitriptyline (ELAVIL) 10 MG tablet, Take 1 tablet (10 mg total) by mouth at bedtime., Disp: 60 tablet, Rfl: 5 .  Ascorbic Acid (VITAMIN C) 1000 MG tablet, Take 1,000 mg by mouth daily. Up to 1250/qd, Disp: , Rfl:  .  aspirin 325 MG tablet, Take by mouth., Disp: , Rfl:  .  augmented betamethasone dipropionate (DIPROLENE-AF) 0.05 % cream, APPLY TWICE DAILY AS NEEDED TO THE AFFECTED AREAS FOR 5-7 DAYS FOR ECZEMA., Disp: , Rfl:  .  b complex vitamins capsule, Take 1 capsule by mouth daily., Disp: , Rfl:  .  BELSOMRA 5 MG TABS, Take 1 tablet by mouth at bedtime., Disp: , Rfl:  .  Blood Pressure Monitoring (BLOOD PRESSURE CUFF) MISC, Take blood pressure as needed for symptoms., Disp: , Rfl:  .  Boswellia Serrata (BOSWELLIA PO), Take 150 mg by mouth every morning. , Disp: , Rfl:  .  CALCIUM CITRATE PO, Take 400 mg by mouth 2 (two) times daily. , Disp: , Rfl:  .  CREON 6000 units CPEP, TAKE 2 CAPSULES WITH EACH MEAL AND TAKE 1 CAPSULE WITH EACH SNACK, Disp: 120 capsule, Rfl: 1 .  cromolyn (GASTROCROM) 100 MG/5ML  solution, Take by mouth 4 (four) times daily -  before meals and at bedtime., Disp: , Rfl:  .  cycloSPORINE (RESTASIS) 0.05 % ophthalmic emulsion, Place 1 drop into both eyes 2 (two) times daily., Disp: 0.4 mL, Rfl: 3 .  desloratadine (CLARINEX) 5 MG tablet, , Disp: , Rfl:  .  diphenhydrAMINE (BENADRYL) 12.5 MG/5ML liquid, Take by mouth 4 (four) times daily as needed., Disp: , Rfl:  .  diphenhydrAMINE-Allantoin 2-0.5 % CREA, Apply 1 application topically as needed. , Disp: , Rfl:  .  docusate sodium (COLACE) 100 MG capsule, Take 100 mg by mouth as needed. , Disp: , Rfl:  .  EPINEPHrine 0.3 mg/0.3 mL IJ SOAJ injection, INJECT ONE AUTO-INJECTOR  INTRAMUSCULARLY AS NEEDED, Disp: , Rfl:  .  estradiol (ESTRACE) 0.1 MG/GM vaginal cream, PLACE 1 APPLICATORFUL VAGINALLY 2 (TWO) TIMES A WEEK., Disp: 42.5 g, Rfl: 0 .  famotidine (PEPCID) 10 MG tablet, Take 10 mg by mouth daily. , Disp: , Rfl:  .  fexofenadine (ALLEGRA) 180 MG tablet, Take 180 mg by mouth daily as needed. , Disp: , Rfl:  .  guaifenesin (HUMIBID E) 400 MG TABS tablet, Take 400 mg by mouth daily. , Disp: , Rfl:  .  hydrOXYzine (ATARAX/VISTARIL) 50 MG tablet, Take 50 mg by mouth daily. , Disp: , Rfl:  .  ketamine (KETALAR) 10 MG/ML injection, , Disp: , Rfl:  .  ketotifen (ZADITOR) 0.025 % ophthalmic solution, Administer 1 drop to both eyes Two (2) times a day. And also take 4 drops ORALLY up to 4 times daily as needed., Disp: , Rfl:  .  L-Lysine 500 MG CAPS, Take by mouth 2 (two) times daily., Disp: , Rfl:  .  L-Theanine 100 MG CAPS, Take 200 mg by mouth daily. , Disp: , Rfl:  .  levocetirizine (XYZAL) 5 MG tablet, Take 1 tablet (5 mg total) by mouth 2 (two) times daily., Disp: 180 tablet, Rfl: 3 .  Light Mineral Oil-Mineral Oil 0.5-0.5 % EMUL, Apply 1 application to eye daily. , Disp: , Rfl:  .  loperamide (IMODIUM) 2 MG capsule, Take by mouth as needed for diarrhea or loose stools., Disp: , Rfl:  .  Magnesium Citrate POWD, Take 435 mg by mouth  daily. , Disp: , Rfl:  .  Melatonin 1 MG/ML LIQD, Take by mouth daily. , Disp: , Rfl:  .  montelukast (SINGULAIR) 10 MG tablet, Take 10 mg by mouth daily. , Disp: , Rfl:  .  polyethylene glycol (MIRALAX / GLYCOLAX) packet, Take 17 g by mouth daily as needed. , Disp: , Rfl:  .  pseudoephedrine (SUDAFED) 30 MG tablet, Take 30 mg by mouth every 4 (four) hours as needed for congestion., Disp: , Rfl:  .  Psyllium 400 MG CAPS, Take by mouth as needed. , Disp: , Rfl:  .  simethicone (MYLICON) 80 MG chewable tablet, Chew 80 mg by mouth every 6 (six) hours as needed for flatulence., Disp: , Rfl:  .  Sodium Fluoride (PREVIDENT 5000 BOOSTER) 1.1 % PSTE, Place onto teeth., Disp: , Rfl:  .  TRIAMCINOLONE ACETONIDE,NASAL, NA, Place into the nose as needed. , Disp: , Rfl:  .  UBRELVY 50 MG TABS, TAKE 1 TABLET BY MOUTH ONCE AS NEEDED FOR UP TO 1 DOSE, Disp: , Rfl:  .  Vitamin D, Cholecalciferol, 10 MCG (400 UNIT) TABS, Take by mouth daily. , Disp: , Rfl:  .  vitamin k 100 MCG tablet, Take 100 mcg by mouth every other day. , Disp: , Rfl:  .  ZENPEP 10000-32000 units CPEP, TAKE 1 CAPSULE BY MOUTH 3 TIMES A DAY WITH MEALS AS NEEDED, Disp: 270 capsule, Rfl: 0  Review of Systems  Constitutional: Negative for chills, diaphoresis, fatigue and fever.  HENT: Positive for ear pain and tinnitus. Negative for congestion and sore throat.   Respiratory: Negative for cough.   Cardiovascular: Negative for chest pain.  Gastrointestinal: Positive for nausea. Negative for abdominal pain, anorexia, change in bowel habit and vomiting.  Musculoskeletal: Negative for arthralgias, joint swelling, myalgias and neck pain.  Skin: Negative for rash.  Neurological: Positive for dizziness, vertigo and tremors. Negative for weakness, numbness and headaches.    Social History  Tobacco Use  . Smoking status: Never Smoker  . Smokeless tobacco: Never Used  Substance Use Topics  . Alcohol use: Never    Frequency: Never       Objective:   BP 117/79   Pulse 87   Temp (!) 96.9 F (36.1 C) (Oral)   Resp 15   Wt 131 lb 3.2 oz (59.5 kg)   BMI 24.39 kg/m  Vitals:   06/22/19 1132  BP: 117/79  Pulse: 87  Resp: 15  Temp: (!) 96.9 F (36.1 C)  TempSrc: Oral  Weight: 131 lb 3.2 oz (59.5 kg)  Body mass index is 24.39 kg/m.   Physical Exam Constitutional:      Appearance: Normal appearance.  HENT:     Head:     Comments: Symptoms provoked by passive ROM in her neck.     Right Ear: Tympanic membrane and ear canal normal.     Left Ear: Tympanic membrane and ear canal normal.  Eyes:     Extraocular Movements: Extraocular movements intact.     Conjunctiva/sclera: Conjunctivae normal.     Pupils: Pupils are equal, round, and reactive to light.     Comments: A few beats of horizontal nystagmus.   Cardiovascular:     Rate and Rhythm: Normal rate and regular rhythm.     Heart sounds: Normal heart sounds.  Pulmonary:     Effort: Pulmonary effort is normal.     Breath sounds: Normal breath sounds.  Skin:    General: Skin is warm and dry.  Neurological:     Mental Status: She is alert.  Psychiatric:        Mood and Affect: Mood normal.        Behavior: Behavior normal.      No results found for any visits on 06/22/19.     Assessment & Plan    1. Vertigo  Symptoms and exam seem consistent with vertigo. Counseled on Epley maneuvers and symptomatic treatment. She will check with her allergist about taking these medications with her current regimen. CBC and blood pressure look normal.   - meclizine (ANTIVERT) 12.5 MG tablet; Take 1 tablet (12.5 mg total) by mouth 3 (three) times daily as needed for dizziness.  Dispense: 30 tablet; Refill: 0 - promethazine (PHENERGAN) 12.5 MG tablet; Take 1 tablet (12.5 mg total) by mouth every 8 (eight) hours as needed for nausea or vomiting.  Dispense: 20 tablet; Refill: 0  2. POTS (postural orthostatic tachycardia syndrome)   3. Dysautonomia ([employer])  I have spent  25 minutes with this patient, >50% of which was spent on counseling and coordination of care.   The entirety of the information documented in the History of Present Illness, Review of Systems and Physical Exam were personally obtained by me. Portions of this information were initially documented by Jennings Books, CMA and reviewed by me for thoroughness and accuracy.           Trinna Post, PA-C  [facility]

## 2019-06-22 NOTE — Patient Instructions (Addendum)
Meclizine Phenergan  How to Perform the Epley Maneuver The Epley maneuver is an exercise that relieves symptoms of vertigo. Vertigo is the feeling that you or your surroundings are moving when they are not. When you feel vertigo, you may feel like the room is spinning and have trouble walking. Dizziness is a little different than vertigo. When you are dizzy, you may feel unsteady or light-headed. You can do this maneuver at home whenever you have symptoms of vertigo. You can do it up to 3 times a day until your symptoms go away. Even though the Epley maneuver may relieve your vertigo for a few weeks, it is possible that your symptoms will return. This maneuver relieves vertigo, but it does not relieve dizziness. What are the risks? If it is done correctly, the Epley maneuver is considered safe. Sometimes it can lead to dizziness or nausea that goes away after a short time. If you develop other symptoms, such as changes in vision, weakness, or numbness, stop doing the maneuver and call your health care provider. How to perform the Epley maneuver 1. Sit on the edge of a bed or table with your back straight and your legs extended or hanging over the edge of the bed or table. 2. Turn your head halfway toward the affected ear or side. 3. Lie backward quickly with your head turned until you are lying flat on your back. You may want to position a pillow under your shoulders. 4. Hold this position for 30 seconds. You may experience an attack of vertigo. This is normal. 5. Turn your head to the opposite direction until your unaffected ear is facing the floor. 6. Hold this position for 30 seconds. You may experience an attack of vertigo. This is normal. Hold this position until the vertigo stops. 7. Turn your whole body to the same side as your head. Hold for another 30 seconds. 8. Sit back up. You can repeat this exercise up to 3 times a day. Follow these instructions at home:  After doing the Epley  maneuver, you can return to your normal activities.  Ask your health care provider if there is anything you should do at home to prevent vertigo. He or she may recommend that you: ? Keep your head raised (elevated) with two or more pillows while you sleep. ? Do not sleep on the side of your affected ear. ? Get up slowly from bed. ? Avoid sudden movements during the day. ? Avoid extreme head movement, like looking up or bending over. Contact a health care provider if:  Your vertigo gets worse.  You have other symptoms, including: ? Nausea. ? Vomiting. ? Headache. Get help right away if:  You have vision changes.  You have a severe or worsening headache or neck pain.  You cannot stop vomiting.  You have new numbness or weakness in any part of your body. Summary  Vertigo is the feeling that you or your surroundings are moving when they are not.  The Epley maneuver is an exercise that relieves symptoms of vertigo.  If the Epley maneuver is done correctly, it is considered safe. You can do it up to 3 times a day. This information is not intended to replace advice given to you by your health care provider. Make sure you discuss any questions you have with your health care provider. Document Released: 09/06/2013 Document Revised: 08/14/2017 Document Reviewed: 07/22/2016 Elsevier Patient Education  2020 [employer].

## 2019-07-01 ENCOUNTER — Other Ambulatory Visit
Admission: RE | Admit: 2019-07-01 | Discharge: 2019-07-01 | Disposition: A | Discharge status: Home / Self Care | Payer: No Typology Code available for payment source | Source: Ambulatory Visit | Attending: Pulmonary Disease | Admitting: Pulmonary Disease

## 2019-07-01 ENCOUNTER — Other Ambulatory Visit: Payer: Self-pay

## 2019-07-01 DIAGNOSIS — Z01812 Encounter for preprocedural laboratory examination: Secondary | ICD-10-CM | POA: Diagnosis present

## 2019-07-01 DIAGNOSIS — Z20828 Contact with and (suspected) exposure to other viral communicable diseases: Secondary | ICD-10-CM | POA: Insufficient documentation

## 2019-07-01 LAB — SARS CORONAVIRUS 2 (TAT 6-24 HRS): SARS Coronavirus 2: NEGATIVE

## 2019-07-05 ENCOUNTER — Ambulatory Visit: Payer: No Typology Code available for payment source | Attending: Internal Medicine

## 2019-07-05 ENCOUNTER — Other Ambulatory Visit: Payer: Self-pay | Admitting: Family Medicine

## 2019-07-05 DIAGNOSIS — G4733 Obstructive sleep apnea (adult) (pediatric): Secondary | ICD-10-CM | POA: Insufficient documentation

## 2019-07-06 ENCOUNTER — Other Ambulatory Visit: Payer: Self-pay

## 2019-07-22 ENCOUNTER — Other Ambulatory Visit: Payer: Self-pay | Admitting: Gastroenterology

## 2019-07-22 LAB — TSH: TSH: 1.77 (ref <=5.90)

## 2019-08-01 ENCOUNTER — Other Ambulatory Visit: Payer: Self-pay | Admitting: Gastroenterology

## 2019-08-09 ENCOUNTER — Encounter: Payer: Self-pay | Admitting: Family Medicine

## 2019-08-09 ENCOUNTER — Telehealth: Payer: Self-pay

## 2019-08-09 DIAGNOSIS — I498 Other specified cardiac arrhythmias: Secondary | ICD-10-CM

## 2019-08-09 DIAGNOSIS — G90A Postural orthostatic tachycardia syndrome (POTS): Secondary | ICD-10-CM

## 2019-08-09 NOTE — Telephone Encounter (Signed)
Lab adde din media as well as abstract.

## 2019-08-22 ENCOUNTER — Other Ambulatory Visit: Payer: Self-pay

## 2019-08-23 ENCOUNTER — Other Ambulatory Visit: Payer: No Typology Code available for payment source

## 2019-08-23 ENCOUNTER — Other Ambulatory Visit: Payer: Self-pay

## 2019-08-23 ENCOUNTER — Inpatient Hospital Stay: Payer: No Typology Code available for payment source | Attending: Oncology

## 2019-08-23 DIAGNOSIS — D509 Iron deficiency anemia, unspecified: Secondary | ICD-10-CM | POA: Diagnosis present

## 2019-08-23 LAB — CBC WITH DIFFERENTIAL/PLATELET
Abs Immature Granulocytes: 0.02 10*3/uL (ref 0.00–0.07)
Basophils Absolute: 0 10*3/uL (ref 0.0–0.1)
Basophils Relative: 1 %
Eosinophils Absolute: 0.1 10*3/uL (ref 0.0–0.5)
Eosinophils Relative: 1 %
HCT: 37.8 % (ref 36.0–46.0)
Hemoglobin: 11.8 g/dL — ABNORMAL LOW (ref 12.0–15.0)
Immature Granulocytes: 0 %
Lymphocytes Relative: 21 %
Lymphs Abs: 1.5 10*3/uL (ref 0.7–4.0)
MCH: 30.1 pg (ref 26.0–34.0)
MCHC: 31.2 g/dL (ref 30.0–36.0)
MCV: 96.4 fL (ref 80.0–100.0)
Monocytes Absolute: 0.6 10*3/uL (ref 0.1–1.0)
Monocytes Relative: 8 %
Neutro Abs: 4.9 10*3/uL (ref 1.7–7.7)
Neutrophils Relative %: 69 %
Platelets: 307 10*3/uL (ref 150–400)
RBC: 3.92 MIL/uL (ref 3.87–5.11)
RDW: 12.1 % (ref 11.5–15.5)
WBC: 7.1 10*3/uL (ref 4.0–10.5)
nRBC: 0 % (ref 0.0–0.2)

## 2019-08-23 LAB — IRON AND TIBC
Iron: 52 ug/dL (ref 28–170)
Saturation Ratios: 16 % (ref 10.4–31.8)
TIBC: 321 ug/dL (ref 250–450)
UIBC: 269 ug/dL

## 2019-08-23 LAB — FERRITIN: Ferritin: 198 ng/mL (ref 11–307)

## 2019-08-28 ENCOUNTER — Other Ambulatory Visit: Payer: Self-pay | Admitting: Obstetrics and Gynecology

## 2019-08-28 DIAGNOSIS — N898 Other specified noninflammatory disorders of vagina: Secondary | ICD-10-CM

## 2019-08-28 NOTE — Progress Notes (Signed)
PCP: Virginia Crews, MD   Chief Complaint  Patient presents with  . Gynecologic Exam  . Follow-up    u/s    HPI:      [name] is a 48 y.o. G1P0010 who LMP was No LMP recorded. Patient is postmenopausal., presents today for her annual examination.  Her menses are absent due to endometrial ablation due to menorrhagia/anemia.  She does not have intermenstrual bleeding. She does not have vasomotor sx. Hx of leiomyoma in past.  Sex activity: not sexually active due to pain/medical conditions. She does have vaginal dryness and uses estrace crm with some relief. Needs RF.  Last Pap: September 07, 2018  Results were: no abnormalities /neg HPV DNA.  Hx of STDs: none  Last mammogram: 04/14/19 at [facility] Results were: normal--routine follow-up in 12 months  There is a FH of breast cancer in her mat aunt who was BRCA neg. Strong FH cancers on both sides, and pt has personal hx of colon cancer age 72.  Had neg cancer genetic testing at [facility] in ~2015/2016 Cephus Shelling) with a POLB VUS in research trials. Being followed at [facility]. Pt did MyRisk testing last yr with RPS20 VUS. IBIS=10.8%/riskscore=12%. The patient does not do self-breast exams.  Dr. Janese Banks wants to follow pt for Lynch cancers even though she had negative genetic testing. Needs yearly GYN u/s.  She is current on colonoscopies. Had stage 2 colon cancer with RT hemicolectomy.  Tobacco use: The patient denies current or previous tobacco use. Alcohol use: none Exercise: very active  She does get adequate calcium and Vitamin D in her diet. Hx of osteopenia and mom has osteoporosis. She exercises regularly, as well as does PT for connective tissue disorder.   Labs with PCP/other MDs.. Is complex medical pt.   Patient Active Problem List   Diagnosis Date Noted  . Iron deficiency 11/04/2018  . History of basal cell carcinoma 10/07/2018  . Vaginal dryness 09/07/2018  . Arthralgia of left temporomandibular joint 07/15/2018   . Chronic migraine with aura 07/15/2018  . Idiopathic small fiber peripheral neuropathy 05/21/2018  . Rectocele 05/21/2018  . Ehlers-Danlos syndrome 03/15/2018  . Patellar instability of left knee 03/15/2018  . 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 Chondral lesion 02/12/2018  . Instability of right shoulder joint 01/12/2018  . 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 Facial neuralgia 12/25/2017  . Instability of left shoulder joint 12/25/2017  . Nausea 12/25/2017  . Occipital neuralgia 12/25/2017  . 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 Hiatal hernia 10/14/2017  . TMJ (dislocation of temporomandibular joint) 09/29/2017  . Pulsatile tinnitus of left ear 12/12/2016  . Restless leg syndrome 08/01/2016  . Cervical dystonia 03/21/2016  . 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 History of sleep apnea 02/06/2016  . Hx of migraine headaches 02/06/2016  . Neck pain 02/06/2016  . Paresthesia 02/06/2016  . Hypercholesterolemia 12/24/2015  . Attention deficit hyperactivity disorder (ADHD), combined type 10/18/2015  . Midline low back pain without sciatica 02/06/2015  . Hyperglycemia 10/30/2014  . History of colon cancer 09/27/2014  . Bursitis 09/15/2005  . POTS (postural orthostatic tachycardia syndrome) 09/16/2003  . Fatigue 09/15/2000  . Asthma 09/15/1988  . Dyspnea 09/15/1988    Past Surgical History:  Procedure Laterality Date  . APPENDECTOMY     removed with colectomy  . BREAST SURGERY     reduction  . COLON SURGERY  2008   for colon cancer, ~1/3 of colon removed, primary reanastimosis  .  COLONOSCOPY WITH PROPOFOL N/A 10/07/2018   Procedure: COLONOSCOPY WITH PROPOFOL;  Surgeon: Lin Landsman, MD;  [facility] END[facility]COPY;  Service: Gastroenterology;  Laterality: N/A;  . ENDOMETRIAL ABLATION      Family History  Problem Relation Age of Onset  . Colon cancer Father 60  . Breast cancer Maternal Aunt 50       BRCA neg  . Endometrial cancer Maternal Aunt 50  . Colon  cancer Maternal Grandmother 53  . Migraines Maternal Grandmother   . Tremor Maternal Grandmother   . Leukemia Maternal Grandfather 17  . Food Allergy Maternal Grandfather   . Migraines Mother   . Glaucoma Mother   . Cataracts Mother   . Tremor Mother   . Fibromyalgia Mother   . Heart disease Mother   . Colon polyps Brother        non-cancerous    Social History   Socioeconomic History  . Marital status: Married    Spouse name: Not on file  . Number of children: 0  . Years of education: Not on file  . Highest education level: Not on file  Occupational History  . Occupation: SSI and disability  Tobacco Use  . Smoking status: Never Smoker  . Smokeless tobacco: Never Used  Substance and Sexual Activity  . Alcohol use: Never  . Drug use: Never  . Sexual activity: Not Currently    Birth control/protection: None  Other Topics Concern  . Not on file  Social History Narrative  . Not on file   Social Determinants of Health   Financial Resource Strain:   . Difficulty of Paying Living Expenses: Not on file  Food Insecurity:   . Worried About Charity fundraiser in the Last Year: Not on file  . Ran Out of Food in the Last Year: Not on file  Transportation Needs:   . Lack of Transportation (Medical): Not on file  . Lack of Transportation (Non-Medical): Not on file  Physical Activity:   . Days of Exercise per Week: Not on file  . Minutes of Exercise per Session: Not on file  Stress:   . Feeling of Stress : Not on file  Social Connections:   . Frequency of Communication with Friends and Family: Not on file  . Frequency of Social Gatherings with Friends and Family: Not on file  . Attends Religious Services: Not on file  . Active Member of Clubs or Organizations: Not on file  . Attends Archivist Meetings: Not on file  . Marital Status: Not on file  Intimate Partner Violence:   . Fear of Current or Ex-Partner: Not on file  . Emotionally Abused: Not on file  .  Physically Abused: Not on file  . Sexually Abused: Not on file     Current Outpatient Medications:  .  acetaminophen (TYLENOL) 500 MG tablet, Take 500 mg by mouth every 6 (six) hours as needed., Disp: , Rfl:  .  albuterol (PROVENTIL HFA;VENTOLIN HFA) 108 (90 Base) MCG/ACT inhaler, Inhale into the lungs., Disp: , Rfl:  .  Alpha-Lipoic Acid 600 MG TABS, Take by mouth., Disp: , Rfl:  .  amitriptyline (ELAVIL) 10 MG tablet, Take 1 tablet (10 mg total) by mouth at bedtime., Disp: 60 tablet, Rfl: 5 .  Ascorbic Acid (VITAMIN C) 1000 MG tablet, Take 1,000 mg by mouth daily. Up to 1250/qd, Disp: , Rfl:  .  aspirin 325 MG tablet, Take by mouth., Disp: , Rfl:  .  augmented betamethasone  dipropionate (DIPROLENE-AF) 0.05 % cream, APPLY TWICE DAILY AS NEEDED TO THE AFFECTED AREAS FOR 5-7 DAYS FOR ECZEMA., Disp: , Rfl:  .  b complex vitamins capsule, Take 1 capsule by mouth daily., Disp: , Rfl:  .  BELSOMRA 5 MG TABS, Take 1 tablet by mouth at bedtime., Disp: , Rfl:  .  Blood Pressure Monitoring (BLOOD PRESSURE CUFF) MISC, Take blood pressure as needed for symptoms., Disp: , Rfl:  .  Boswellia Serrata (BOSWELLIA PO), Take 150 mg by mouth every morning. , Disp: , Rfl:  .  CALCIUM CITRATE PO, Take 400 mg by mouth 2 (two) times daily. , Disp: , Rfl:  .  CREON 6000 units CPEP, TAKE 2 CAPSULES WITH EACH MEAL AND TAKE 1 CAPSULE WITH EACH SNACK, Disp: 120 capsule, Rfl: 1 .  cromolyn (GASTROCROM) 100 MG/5ML solution, Take by mouth 4 (four) times daily -  before meals and at bedtime., Disp: , Rfl:  .  desloratadine (CLARINEX) 5 MG tablet, , Disp: , Rfl:  .  diphenhydrAMINE (BENADRYL) 12.5 MG/5ML liquid, Take by mouth 4 (four) times daily as needed., Disp: , Rfl:  .  diphenhydrAMINE-Allantoin 2-0.5 % CREA, Apply 1 application topically as needed. , Disp: , Rfl:  .  docusate sodium (COLACE) 100 MG capsule, Take 100 mg by mouth as needed. , Disp: , Rfl:  .  econazole nitrate 1 % cream, APPLY TO FEET TWICE A DAY FOR 2 4  WEEKS AS NEEDED FOR FLARE, Disp: , Rfl:  .  EPINEPHrine 0.3 mg/0.3 mL IJ SOAJ injection, INJECT ONE AUTO-INJECTOR INTRAMUSCULARLY AS NEEDED, Disp: , Rfl:  .  estradiol (ESTRACE) 0.1 MG/GM vaginal cream, PLACE 1 APPLICATORFUL VAGINALLY 2 (TWO) TIMES A WEEK., Disp: 42.5 g, Rfl: 1 .  famotidine (PEPCID) 10 MG tablet, Take 10 mg by mouth daily. , Disp: , Rfl:  .  fexofenadine (ALLEGRA) 180 MG tablet, Take 180 mg by mouth daily as needed. , Disp: , Rfl:  .  guaifenesin (HUMIBID E) 400 MG TABS tablet, Take 400 mg by mouth daily. , Disp: , Rfl:  .  guaifenesin (ROBITUSSIN) 100 MG/5ML syrup, Take by mouth., Disp: , Rfl:  .  hydrOXYzine (ATARAX/VISTARIL) 50 MG tablet, Take 50 mg by mouth daily. , Disp: , Rfl:  .  ketotifen (ZADITOR) 0.025 % ophthalmic solution, Administer 1 drop to both eyes Two (2) times a day. And also take 4 drops ORALLY up to 4 times daily as needed., Disp: , Rfl:  .  Ketotifen Fumarate POWD, Take by mouth., Disp: , Rfl:  .  L-Lysine 500 MG CAPS, Take by mouth 2 (two) times daily., Disp: , Rfl:  .  L-Theanine 100 MG CAPS, Take 200 mg by mouth daily. , Disp: , Rfl:  .  levocetirizine (XYZAL) 5 MG tablet, Take 1 tablet (5 mg total) by mouth 2 (two) times daily., Disp: 180 tablet, Rfl: 3 .  Light Mineral Oil-Mineral Oil 0.5-0.5 % EMUL, Apply 1 application to eye daily. , Disp: , Rfl:  .  loperamide (IMODIUM) 2 MG capsule, Take by mouth as needed for diarrhea or loose stools., Disp: , Rfl:  .  Magnesium Citrate POWD, Take 435 mg by mouth daily. , Disp: , Rfl:  .  Melatonin 1 MG/ML LIQD, Take by mouth daily. , Disp: , Rfl:  .  montelukast (SINGULAIR) 10 MG tablet, Take 10 mg by mouth daily. , Disp: , Rfl:  .  NALTREXONE HCL PO, 0.25 mg., Disp: , Rfl:  .  Nerve Stimulator (CEFALY  KIT) DEVI, , Disp: , Rfl:  .  omega-3 acid ethyl esters (LOVAZA) 1 g capsule, 4 g., Disp: , Rfl:  .  Omega-3 Fatty Acids (FISH OIL) 1000 MG CAPS, Take by mouth., Disp: , Rfl:  .  polyethylene glycol (MIRALAX /  GLYCOLAX) packet, Take 17 g by mouth daily as needed. , Disp: , Rfl:  .  pseudoephedrine (SUDAFED) 30 MG tablet, Take 30 mg by mouth every 4 (four) hours as needed for congestion., Disp: , Rfl:  .  Psyllium 400 MG CAPS, Take by mouth as needed. , Disp: , Rfl:  .  RESTASIS 0.05 % ophthalmic emulsion, INSTILL 1 DROP INTO BOTH EYES TWICE A DAY, Disp: 180 mL, Rfl: 1 .  simethicone (MYLICON) 80 MG chewable tablet, Chew 80 mg by mouth every 6 (six) hours as needed for flatulence., Disp: , Rfl:  .  Sodium Fluoride (PREVIDENT 5000 BOOSTER) 1.1 % PSTE, Place onto teeth., Disp: , Rfl:  .  TRIAMCINOLONE ACETONIDE,NASAL, NA, Place into the nose as needed. , Disp: , Rfl:  .  TURMERIC PO, , Disp: , Rfl:  .  UBRELVY 50 MG TABS, TAKE 1 TABLET BY MOUTH ONCE AS NEEDED FOR UP TO 1 DOSE, Disp: , Rfl:  .  VIMPAT 50 MG TABS tablet, TAKE 1 TABLET (50 MG TOTAL) BY MOUTH 2 (TWO) TIMES DAILY FOR 180 DAYS, Disp: , Rfl:  .  Vitamin D, Cholecalciferol, 10 MCG (400 UNIT) TABS, Take by mouth daily. , Disp: , Rfl:  .  vitamin k 100 MCG tablet, Take 100 mcg by mouth every other day. , Disp: , Rfl:  .  ZENPEP 10000-32000 units CPEP, TAKE 1 CAPSULE BY MOUTH 3 TIMES A DAY WITH MEALS AS NEEDED, Disp: 270 capsule, Rfl: 0     ROS:  Review of Systems  Constitutional: Positive for fatigue. Negative for fever and unexpected weight change.  Respiratory: Positive for shortness of breath. Negative for cough and wheezing.   Cardiovascular: Negative for chest pain, palpitations and leg swelling.  Gastrointestinal: Positive for diarrhea and nausea. Negative for blood in stool, constipation and vomiting.  Endocrine: Negative for cold intolerance, heat intolerance and polyuria.  Genitourinary: Positive for dyspareunia. Negative for dysuria, flank pain, frequency, genital sores, hematuria, menstrual problem, pelvic pain, urgency, vaginal bleeding, vaginal discharge and vaginal pain.  Musculoskeletal: Positive for arthralgias. Negative for  back pain, joint swelling and myalgias.  Skin: Negative for rash.  Neurological: Positive for dizziness, numbness and headaches. Negative for syncope and light-headedness.  Hematological: Negative for adenopathy.  Psychiatric/Behavioral: Negative for agitation, confusion, sleep disturbance and suicidal ideas. The patient is not nervous/anxious.   BREAST: No symptoms    Objective: BP 120/80   Ht 5' 1.5" (1.562 m)   Wt 128 lb (58.1 kg)   BMI 23.79 kg/m    Physical Exam Constitutional:      Appearance: She is well-developed.  Genitourinary:     Vulva, vagina, cervix, uterus, right adnexa and left adnexa normal.     No vulval lesion or tenderness noted.     No vaginal discharge, erythema or tenderness.     No cervical polyp.     Uterus is not enlarged or tender.     No right or left adnexal mass present.     Right adnexa not tender.     Left adnexa not tender.  Neck:     Thyroid: No thyromegaly.  Cardiovascular:     Rate and Rhythm: Normal rate and regular rhythm.  Heart sounds: Normal heart sounds. No murmur.  Pulmonary:     Effort: Pulmonary effort is normal.     Breath sounds: Normal breath sounds.  Chest:     Breasts:        Right: No mass, nipple discharge, skin change or tenderness.        Left: No mass, nipple discharge, skin change or tenderness.  Abdominal:     Palpations: Abdomen is soft.     Tenderness: There is no abdominal tenderness. There is no guarding.  Musculoskeletal:        General: Normal range of motion.     Cervical back: Normal range of motion.  Neurological:     General: No focal deficit present.     Mental Status: She is alert and oriented to person, place, and time.     Cranial Nerves: No cranial nerve deficit.  Skin:    General: Skin is warm and dry.  Psychiatric:        Mood and Affect: Mood normal.        Behavior: Behavior normal.        Thought Content: Thought content normal.        Judgment: Judgment normal.  Vitals reviewed.      Results:  ULTRASOUND REPORT  Location: [facility]  Date of Service: 08/29/2019     Indications:Routine Findings:  The uterus is anteverted and measures 5.0 x 3.0 x 1.7 cm. Echo texture is homogenous with evidence of focal masses. Within the uterus are multiple suspected fibroids measuring:  Fibroid 1:  1.2 x 0.9 x 1.4 cm   Anterior SM     The Endometrium measures 3 mm.  Right Ovary measures 1.6 x 1.8 x 1.2 cm. It is normal in appearance. Left Ovary measures 1.5 x 1.5 x 0.9 cm. It is normal in appearance. Survey of the adnexa demonstrates no adnexal masses. There is no free fluid in the cul de sac.  Impression: 1. SM anterior fibroid as described above.  Recommendations: 1.Clinical correlation with the patient's History and Physical Exam.  Jenine M. Albertine Grates    RDMS   Assessment/Plan:  Encounter for annual routine gynecological examination  Encounter for screening mammogram for malignant neoplasm of breast; pt current on mammo.  Screening for ovarian cancer--neg GYN u/s. Repeat in 1 yr per oncology.  History of colon cancer--followed by GI/oncology.  Vaginal dryness--Rx RF estrace crm. Sent to mail order per pt pref   Meds ordered this encounter  Medications  . DISCONTD: estradiol (ESTRACE) 0.1 MG/GM vaginal cream    Sig: PLACE 1 APPLICATORFUL VAGINALLY 2 (TWO) TIMES A WEEK.    Dispense:  42.5 g    Refill:  1    Order Specific Question:   Supervising Provider    Answer:   Gae Dry [id number]  . estradiol (ESTRACE) 0.1 MG/GM vaginal cream    Sig: PLACE 1 APPLICATORFUL VAGINALLY 2 (TWO) TIMES A WEEK.    Dispense:  42.5 g    Refill:  1    Order Specific Question:   Supervising Provider    Answer:   Gae Dry [[id number]]            GYN counsel breast self exam, mammography screening, menopause, adequate intake of calcium and vitamin D, diet and exercise    F/U  Return in about 1 year (around 08/28/2020).   B.  , PA-C 08/29/2019 10:19 AM

## 2019-08-29 ENCOUNTER — Encounter: Payer: Self-pay | Admitting: Obstetrics and Gynecology

## 2019-08-29 ENCOUNTER — Other Ambulatory Visit: Payer: Self-pay | Admitting: Obstetrics and Gynecology

## 2019-08-29 ENCOUNTER — Ambulatory Visit (INDEPENDENT_AMBULATORY_CARE_PROVIDER_SITE_OTHER): Payer: No Typology Code available for payment source | Admitting: Obstetrics and Gynecology

## 2019-08-29 ENCOUNTER — Ambulatory Visit (INDEPENDENT_AMBULATORY_CARE_PROVIDER_SITE_OTHER): Payer: No Typology Code available for payment source

## 2019-08-29 ENCOUNTER — Other Ambulatory Visit: Payer: Self-pay

## 2019-08-29 VITALS — BP 120/80 | Ht 61.5 in | Wt 128.0 lb

## 2019-08-29 DIAGNOSIS — Z01419 Encounter for gynecological examination (general) (routine) without abnormal findings: Secondary | ICD-10-CM | POA: Diagnosis not present

## 2019-08-29 DIAGNOSIS — D250 Submucous leiomyoma of uterus: Secondary | ICD-10-CM | POA: Diagnosis not present

## 2019-08-29 DIAGNOSIS — C189 Malignant neoplasm of colon, unspecified: Secondary | ICD-10-CM

## 2019-08-29 DIAGNOSIS — Z85038 Personal history of other malignant neoplasm of large intestine: Secondary | ICD-10-CM

## 2019-08-29 DIAGNOSIS — N898 Other specified noninflammatory disorders of vagina: Secondary | ICD-10-CM

## 2019-08-29 DIAGNOSIS — Z1273 Encounter for screening for malignant neoplasm of ovary: Secondary | ICD-10-CM

## 2019-08-29 DIAGNOSIS — Z1231 Encounter for screening mammogram for malignant neoplasm of breast: Secondary | ICD-10-CM

## 2019-08-29 MED ORDER — ESTRADIOL 0.1 MG/GM VA CREA: TOPICAL_CREAM | VAGINAL | 1 refills | Status: DC

## 2019-08-29 NOTE — Telephone Encounter (Signed)
Please see

## 2019-08-29 NOTE — Patient Instructions (Signed)
I value your feedback and entrusting us with your care. If you get a [facility] patient survey, I would appreciate you taking the time to let us know about your experience today. Thank you!  As of August 25, 2019, your lab results will be released to your MyChart immediately, before I even have a chance to see them. Please give me time to review them and contact you if there are any abnormalities. Thank you for your patience.  

## 2019-08-29 NOTE — Progress Notes (Signed)
Rx called in verbally

## 2019-09-19 ENCOUNTER — Encounter: Payer: Self-pay | Admitting: Student in an Organized Health Care Education/Training Program

## 2019-09-23 ENCOUNTER — Other Ambulatory Visit: Payer: Self-pay | Admitting: Gastroenterology

## 2019-09-27 ENCOUNTER — Encounter
Payer: No Typology Code available for payment source | Admitting: Student in an Organized Health Care Education/Training Program

## 2019-10-14 ENCOUNTER — Encounter: Payer: Self-pay | Admitting: Family Medicine

## 2019-10-17 ENCOUNTER — Encounter: Payer: Self-pay | Admitting: Oncology

## 2019-10-18 ENCOUNTER — Encounter: Payer: Self-pay | Admitting: Family Medicine

## 2019-10-18 ENCOUNTER — Other Ambulatory Visit: Payer: Self-pay | Admitting: *Deleted

## 2019-10-18 DIAGNOSIS — D509 Iron deficiency anemia, unspecified: Secondary | ICD-10-CM

## 2019-11-12 ENCOUNTER — Other Ambulatory Visit: Payer: Self-pay | Admitting: Obstetrics and Gynecology

## 2019-11-12 DIAGNOSIS — N898 Other specified noninflammatory disorders of vagina: Secondary | ICD-10-CM

## 2019-11-17 DIAGNOSIS — G43009 Migraine without aura, not intractable, without status migrainosus: Secondary | ICD-10-CM | POA: Insufficient documentation

## 2019-11-18 ENCOUNTER — Inpatient Hospital Stay: Payer: No Typology Code available for payment source | Attending: Oncology

## 2019-11-18 ENCOUNTER — Other Ambulatory Visit: Payer: Self-pay

## 2019-11-18 DIAGNOSIS — Z85038 Personal history of other malignant neoplasm of large intestine: Secondary | ICD-10-CM | POA: Insufficient documentation

## 2019-11-18 DIAGNOSIS — Z9221 Personal history of antineoplastic chemotherapy: Secondary | ICD-10-CM | POA: Insufficient documentation

## 2019-11-18 DIAGNOSIS — D509 Iron deficiency anemia, unspecified: Secondary | ICD-10-CM | POA: Insufficient documentation

## 2019-11-18 LAB — CBC WITH DIFFERENTIAL/PLATELET
Abs Immature Granulocytes: 0.02 10*3/uL (ref 0.00–0.07)
Basophils Absolute: 0 10*3/uL (ref 0.0–0.1)
Basophils Relative: 1 %
Eosinophils Absolute: 0.1 10*3/uL (ref 0.0–0.5)
Eosinophils Relative: 1 %
HCT: 38 % (ref 36.0–46.0)
Hemoglobin: 11.9 g/dL — ABNORMAL LOW (ref 12.0–15.0)
Immature Granulocytes: 0 %
Lymphocytes Relative: 22 %
Lymphs Abs: 1.4 10*3/uL (ref 0.7–4.0)
MCH: 30 pg (ref 26.0–34.0)
MCHC: 31.3 g/dL (ref 30.0–36.0)
MCV: 95.7 fL (ref 80.0–100.0)
Monocytes Absolute: 0.5 10*3/uL (ref 0.1–1.0)
Monocytes Relative: 8 %
Neutro Abs: 4.3 10*3/uL (ref 1.7–7.7)
Neutrophils Relative %: 68 %
Platelets: 276 10*3/uL (ref 150–400)
RBC: 3.97 MIL/uL (ref 3.87–5.11)
RDW: 11.9 % (ref 11.5–15.5)
WBC: 6.3 10*3/uL (ref 4.0–10.5)
nRBC: 0 % (ref 0.0–0.2)

## 2019-11-18 LAB — URINALYSIS, COMPLETE (UACMP) WITH MICROSCOPIC
Bacteria, UA: NONE SEEN
Bilirubin Urine: NEGATIVE
Glucose, UA: NEGATIVE mg/dL
Hgb urine dipstick: NEGATIVE
Ketones, ur: NEGATIVE mg/dL
Leukocytes,Ua: NEGATIVE
Nitrite: NEGATIVE
Protein, ur: NEGATIVE mg/dL
Specific Gravity, Urine: 1.01 (ref 1.005–1.030)
pH: 7.5 (ref 5.0–8.0)

## 2019-11-18 LAB — IRON AND TIBC
Iron: 50 ug/dL (ref 28–170)
Saturation Ratios: 15 % (ref 10.4–31.8)
TIBC: 339 ug/dL (ref 250–450)
UIBC: 289 ug/dL

## 2019-11-18 LAB — FERRITIN: Ferritin: 143 ng/mL (ref 11–307)

## 2019-11-20 ENCOUNTER — Encounter: Payer: Self-pay | Admitting: Obstetrics and Gynecology

## 2019-11-20 ENCOUNTER — Other Ambulatory Visit: Payer: Self-pay | Admitting: Obstetrics and Gynecology

## 2019-11-20 DIAGNOSIS — N898 Other specified noninflammatory disorders of vagina: Secondary | ICD-10-CM

## 2019-11-21 ENCOUNTER — Inpatient Hospital Stay (HOSPITAL_BASED_OUTPATIENT_CLINIC_OR_DEPARTMENT_OTHER): Payer: No Typology Code available for payment source | Admitting: Oncology

## 2019-11-21 ENCOUNTER — Other Ambulatory Visit: Payer: No Typology Code available for payment source

## 2019-11-21 ENCOUNTER — Encounter: Payer: Self-pay | Admitting: Oncology

## 2019-11-21 ENCOUNTER — Other Ambulatory Visit: Payer: Self-pay

## 2019-11-21 ENCOUNTER — Ambulatory Visit: Payer: No Typology Code available for payment source | Admitting: Oncology

## 2019-11-21 DIAGNOSIS — D509 Iron deficiency anemia, unspecified: Secondary | ICD-10-CM | POA: Diagnosis not present

## 2019-11-21 NOTE — Progress Notes (Signed)
Patient stated that she had been doing well with no complaints. 

## 2019-11-21 NOTE — Telephone Encounter (Signed)
Spoke with pt. She is using 1 g twice wkly of a 42.5 g tube but states she goes through it in close to a month. Tube should last 21 wks in her case. Confirmed 1 g applicator and [id number] g tube. I don't understand this. Pt confirmed she still has 2/3 of a tube left from 12/20 Rx and 1 RF. Pt to call me later this yr for RF before annual.

## 2019-11-22 ENCOUNTER — Encounter: Payer: Self-pay | Admitting: Family Medicine

## 2019-11-22 NOTE — Progress Notes (Signed)
Patient: Autumn Zhang Female    DOB: Jan 23, 1971   49 y.o.   MRN: [id number] Visit Date: 11/23/2019  Today's Provider: [name], MD   No chief complaint on file.  Subjective:    I Sulibeya S. Dimas, CMA, am acting as scribe for [name], MD.  Virtual Visit via Video Note  I connected with Autumn Zhang on 11/23/19 at  9:20 AM EST by a video enabled telemedicine application and verified that I am speaking with the correct person using two identifiers.  Location: Patient location: home Provider location: [facility] Persons involved in the visit: patient, provider   I discussed the limitations of evaluation and management by telemedicine and the availability of in person appointments. The patient expressed understanding and agreed to proceed.  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 Allergist has ok'd COVID vaccination.  She will be eligible in group 4.  Needs to wait 30 minutes.  Calcium intake, pt is taking 400 mg daily, and a lot from greens and seaseme seeds. Patient reports that [facility] advised her to just take 400 mg of calcium.  Patient reports that joint pain and swelling in hands and feet worsening over last several months.  Hands are swelling more at night.  Compression socks and elevation have not helped.  Has been negative for ANA, RF in 2017.  ESR increased in 2019.    [place] on Feb 20th.  No pattern, not a recurrent issue.  Mechanical fall.  Did bump her head. 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 Ioversol Anxiety, Hives, Itching and Palpitations  . Ciprofloxacin Other (See Comments)  . Eggs Or Egg-Derived Products Other (See Comments)    Exacerbates neuralgia symptoms Exacerbates neuralgia symptoms Congestion &  Headaches Exacerbates neuralgia symptoms Congestion & Headaches   . Ginger Other (See Comments)    Congestion & Headaches Congestion & Headaches   . Levofloxacin Other (See Comments)    Congestion & Headaches Congestion & Headaches   . Other Other (See Comments)    Novocaine (IV) form Hearing loss Novocaine (IV) form Hearing loss  Congestion & Headaches Congestion & Headaches Congestion & Headaches Congestion & Headaches   . Soybean-Containing Drug Products Other (See Comments)    Congestion & Headaches   . Bee Pollen   . Bupropion   . Celebrex [Celecoxib]   . Codeine   . Dexilant [Dexlansoprazole]   . Dulera [Mometasone Furo-Formoterol Fum]   . Fentanyl   . Fluticasone   . Gabapentin   . Gluten Meal   . Iodine   . Lidocaine     Rash to topical cream  . Lyrica [Pregabalin]   . Mold Extract [Trichophyton]   . Morphine And Related   . Penicillins     Skin testing positive for allergy  . Pollen Extract   . Prilosec [Omeprazole]   . Rizatriptan Other (See Comments)    Dizziness, headache, myalgias, nausea  . Salmeterol Xinafoate   . Silenor [Doxepin Hcl]   . Tomato   . Ethanol Dermatitis, Other (See Comments), Itching and Palpitations  . Hydrocodone Nausea Only  . Ibuprofen Nausea Only    At high doses At high doses   . Lac [place] Diarrhea    Facial burning sensation Facial burning sensation Facial burning sensation Facial  burning sensation   . Lanolin Other (See Comments)    Exacerbates neuralgia symptoms Wool causes hives.   . Latex Other (See Comments)    Other Reaction: Other reaction "sensitivity" Other Reaction: Other reaction "sensitivity"   . Milk-Related Compounds Diarrhea    Facial burning sensation   . Wheat Bran Diarrhea     Current Outpatient Medications:  .  acetaminophen (TYLENOL) 500 MG tablet, Take 500 mg by mouth every 6 (six) hours as needed., Disp: , Rfl:  .  albuterol (PROVENTIL HFA;VENTOLIN HFA) 108 (90 Base) MCG/ACT  inhaler, Inhale into the lungs., Disp: , Rfl:  .  amitriptyline (ELAVIL) 10 MG tablet, Take 1 tablet (10 mg total) by mouth at bedtime., Disp: 60 tablet, Rfl: 5 .  Ascorbic Acid (VITAMIN C) 1000 MG tablet, Take 1,000 mg by mouth daily. Up to 1250/qd, Disp: , Rfl:  .  aspirin 325 MG tablet, Take by mouth., Disp: , Rfl:  .  b complex vitamins capsule, Take 1 capsule by mouth daily., Disp: , Rfl:  .  BELSOMRA 5 MG TABS, Take 1 tablet by mouth at bedtime., Disp: , Rfl:  .  Boswellia Serrata (BOSWELLIA PO), Take 150 mg by mouth every morning. , Disp: , Rfl:  .  calcium citrate (CALCITRATE - DOSED IN MG ELEMENTAL CALCIUM) 950 (200 Ca) MG tablet, Take 0.5 tablets by mouth daily. , Disp: , Rfl:  .  CREON 6000 units CPEP, TAKE 2 CAPSULES WITH EACH MEAL AND TAKE 1 CAPSULE WITH EACH SNACK, Disp: 120 capsule, Rfl: 1 .  cromolyn (GASTROCROM) 100 MG/5ML solution, Take by mouth 4 (four) times daily -  before meals and at bedtime., Disp: , Rfl:  .  desloratadine (CLARINEX) 5 MG tablet, 2.5 mg 2 (two) times daily. , Disp: , Rfl:  .  diphenhydrAMINE (BENADRYL) 12.5 MG/5ML liquid, Take by mouth 4 (four) times daily as needed., Disp: , Rfl:  .  diphenhydrAMINE-Allantoin 2-0.5 % CREA, Apply 1 application topically as needed. , Disp: , Rfl:  .  docusate sodium (COLACE) 100 MG capsule, Take 100 mg by mouth as needed. , Disp: , Rfl:  .  econazole nitrate 1 % cream, APPLY TO FEET TWICE A DAY FOR 2 4 WEEKS AS NEEDED FOR FLARE, Disp: , Rfl:  .  EPINEPHrine 0.3 mg/0.3 mL IJ SOAJ injection, INJECT ONE AUTO-INJECTOR INTRAMUSCULARLY AS NEEDED, Disp: , Rfl:  .  estradiol (ESTRACE) 0.1 MG/GM vaginal cream, PLACE 1 APPLICATORFUL VAGINALLY 2 (TWO) TIMES A WEEK., Disp: 42.5 g, Rfl: 1 .  famotidine (PEPCID) 10 MG tablet, Take 10 mg by mouth daily. , Disp: , Rfl:  .  fexofenadine (ALLEGRA) 180 MG tablet, Take 180 mg by mouth daily as needed. , Disp: , Rfl:  .  guaifenesin (HUMIBID E) 400 MG TABS tablet, Take 400 mg by mouth daily. ,  Disp: , Rfl:  .  hydrOXYzine (ATARAX/VISTARIL) 50 MG tablet, Take 50 mg by mouth daily. , Disp: , Rfl:  .  ketotifen (ZADITOR) 0.025 % ophthalmic solution, Administer 1 drop to both eyes Two (2) times a day. And also take 4 drops ORALLY up to 4 times daily as needed., Disp: , Rfl:  .  Ketotifen Fumarate POWD, Take by mouth., Disp: , Rfl:  .  L-Lysine 500 MG CAPS, Take by mouth 2 (two) times daily., Disp: , Rfl:  .  L-Theanine 100 MG CAPS, Take 200 mg by mouth daily. , Disp: , Rfl:  .  levocetirizine (XYZAL) 5 MG tablet, Take 1 tablet (  5 mg total) by mouth 2 (two) times daily., Disp: 180 tablet, Rfl: 3 .  Light Mineral Oil-Mineral Oil 0.5-0.5 % EMUL, Apply 1 application to eye daily. , Disp: , Rfl:  .  loperamide (IMODIUM) 2 MG capsule, Take by mouth as needed for diarrhea or loose stools., Disp: , Rfl:  .  Magnesium Citrate POWD, Take 435 mg by mouth daily. , Disp: , Rfl:  .  Melatonin 1 MG/ML LIQD, Take by mouth daily. , Disp: , Rfl:  .  montelukast (SINGULAIR) 10 MG tablet, Take 10 mg by mouth daily. , Disp: , Rfl:  .  omega-3 acid ethyl esters (LOVAZA) 1 g capsule, 4 g., Disp: , Rfl:  .  polyethylene glycol (MIRALAX / GLYCOLAX) packet, Take 17 g by mouth daily as needed. , Disp: , Rfl:  .  RESTASIS 0.05 % ophthalmic emulsion, INSTILL 1 DROP INTO BOTH EYES TWICE A DAY, Disp: 180 mL, Rfl: 1 .  simethicone (MYLICON) 80 MG chewable tablet, Chew 80 mg by mouth every 6 (six) hours as needed for flatulence., Disp: , Rfl:  .  Sodium Fluoride (PREVIDENT 5000 BOOSTER) 1.1 % PSTE, Place onto teeth., Disp: , Rfl:  .  TRIAMCINOLONE ACETONIDE,NASAL, NA, Place into the nose as needed. , Disp: , Rfl:  .  UBRELVY 50 MG TABS, TAKE 1 TABLET BY MOUTH ONCE AS NEEDED FOR UP TO 1 DOSE, Disp: , Rfl:  .  Vitamin D, Cholecalciferol, 10 MCG (400 UNIT) TABS, Take by mouth daily. , Disp: , Rfl:  .  vitamin k 100 MCG tablet, Take 100 mcg by mouth every other day. , Disp: , Rfl:  .  augmented betamethasone dipropionate  (DIPROLENE-AF) 0.05 % cream, APPLY TWICE DAILY AS NEEDED TO THE AFFECTED AREAS FOR 5-7 DAYS FOR ECZEMA., Disp: , Rfl:  .  Blood Pressure Monitoring (BLOOD PRESSURE CUFF) MISC, Take blood pressure as needed for symptoms., Disp: , Rfl:  .  Nerve Stimulator (CEFALY KIT) DEVI, , Disp: , Rfl:  .  Non Gelatin Capsules, Empty, (CAPSULE #3 CLEAR/CLEAR VEG) CAPS, Take 1 capsule by mouth daily., Disp: , Rfl:  .  pseudoephedrine (SUDAFED) 30 MG tablet, Take 30 mg by mouth every 4 (four) hours as needed for congestion., Disp: , Rfl:  .  Turmeric & Tamarind Blend TABS, Take 1 tablet by mouth daily., Disp: , Rfl:   Review of Systems  Constitutional: Negative.   Respiratory: Negative.   Cardiovascular: Positive for leg swelling. Negative for chest pain and palpitations.  Musculoskeletal: Positive for arthralgias, joint swelling and myalgias.    Social History   Tobacco Use  . Smoking status: Never Smoker  . Smokeless tobacco: Never Used  Substance Use Topics  . Alcohol use: Never      Objective:   BP 113/74 (BP Location: Left Arm, Patient Position: Sitting, Cuff Size: Normal)   Pulse 74   Temp (!) 97.4 F (36.3 C) (Oral)   Wt 134 lb (60.8 kg)   BMI 24.91 kg/m  Vitals:   11/22/19 1503  BP: 113/74  Pulse: 74  Temp: (!) 97.4 F (36.3 C)  TempSrc: Oral  Weight: 134 lb (60.8 kg)  Body mass index is 24.91 kg/m.   Physical Exam Constitutional:      General: She is not in acute distress.    Appearance: Normal appearance. She is not diaphoretic.  Pulmonary:     Effort: Pulmonary effort is normal. No respiratory distress.  Neurological:     Mental Status: She is alert and  oriented to person, place, and time. Mental status is at baseline.  Psychiatric:        Mood and Affect: Mood normal.        Behavior: Behavior normal.      No results found for any visits on 11/23/19.     Assessment & Plan    Follow Up Instructions I discussed the assessment and treatment plan with the  patient. The patient was provided an opportunity to ask questions and all were answered. The patient agreed with the plan and demonstrated an understanding of the instructions.   The patient was advised to call back or seek an in-person evaluation if the symptoms worsen or if the condition fails to improve as anticipated.  1. Osteopenia, unspecified location - repeat DEXA in 01/2020 -Continue current calcium supplementation -Discussed recommended 1200 mg of calcium supplement daily, but should be primarily getting this through her diet -Handout given about calcium content in various foods -Continue vitamin D supplement as well  2. Bilateral hand swelling 3. Polyarthralgia Worsening of polyarthralgias and bilateral hand swelling over the last few months -Has previously been evaluated by rheumatology and was told that she did possibly have early Sjogren's, but was negative for rheumatoid factor -Referral to new rheumatologist locally -We will also give DME prescription for compression gloves - Ambulatory referral to Rheumatology  4. Bilateral leg edema - will order new compression stockings DME - patient to message about preferred servicer - Ambulatory referral to Rheumatology  5. Vaccine counseling - patient will be eligible for COVID vaccination in group 4 due to chronic medical conditions - discussed bringing epi-pen given h/o anaphylaxis to iodine - encouraged 48mn observation after vaccine  6. Iodine deficiency - continue to check iodine and TSH q655m Follow up for CPE as scheduled  The entirety of the information documented in the History of Present Illness, Review of Systems and Physical Exam were personally obtained by me. Portions of this information were initially documented by SuLynford HumphreyCMA and reviewed by me for thoroughness and accuracy.    Autumn Zhang, AnDionne BucyMD MPH [facility]

## 2019-11-22 NOTE — Progress Notes (Signed)
I connected with Autumn Zhang on 11/22/19 at 10:00 AM EST by video enabled telemedicine visit and verified that I am speaking with the correct person using two identifiers.   I discussed the limitations, risks, security and privacy concerns of performing an evaluation and management service by telemedicine and the availability of in-person appointments. I also discussed with the patient that there may be a patient responsible charge related to this service. The patient expressed understanding and agreed to proceed.  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 She underwent right hemicolectomy which revealed poorly differentiated invasive adenocarcinoma T3N0. 0 out of 34 lymph nodes were positive. She received 12 cycles of adjuvant chemotherapy with 5-FU leucovorin. No oxaliplatin. 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 Her only brother has also had colon polyps. She has therefore been managed as a Lynch syndrome patient despite not having genetic features suggestive of Lynch syndrome. She took prophylactic aspirin for a while and then she stopped taking it. She has been getting colonoscopies every 1 to 2 years. She has also had capsule endoscopy in 2014 for iron deficiency anemia that was normal. She has not required any surveillance scans as she has been more than 5 years since her diagnosis of colon cancer. She has established care with Dr. Marius Ditch for this.  Other than that patient has a history of basal cell skin cancer x2 and is in the process of finding a dermatologist. She was also getting yearly pelvic exams  and pelvic ultrasound to screen for GYN cancers. Patient has a history of mast cell activation syndrome and often needs Benadryl before taking oral iron. Also reports that she has dysautonomia and pots for which she sees cardiology.Also has a h/o Ehler Danlos syndrome  Currently patient has an ongoing migraine attack. She is wearing dark glasses and a cat and unable to see eye to eye. She denies any blood in her stool or urine presently. Denies any dark melanotic stools. Her last IV iron was about 6 months ago and she does not remember what type of IV iron she has received.  She sees Dr. Esperanza Sheets from [facility] for mast cell activation syndrome    Interval history she is doing well overall and did not notice any blood in her stool or urine.   Review of Systems  Constitutional: Negative for chills, fever, malaise/fatigue and weight loss.  HENT: Negative for congestion, ear discharge and nosebleeds.   Eyes: Negative for blurred vision.  Respiratory: Negative for cough, hemoptysis, sputum production, shortness of breath and wheezing.   Cardiovascular: Negative for chest pain, palpitations, orthopnea and claudication.  Gastrointestinal: Negative for abdominal pain, blood in stool, constipation, diarrhea, heartburn, melena, nausea and vomiting.  Genitourinary: Negative for dysuria, flank pain, frequency, hematuria and urgency.  Musculoskeletal: Negative for back pain, joint pain and myalgias.  Skin: Negative for rash.  Neurological: Negative for dizziness, tingling, focal weakness, seizures, weakness and headaches.  Endo/Heme/Allergies: Does not bruise/bleed easily.  Psychiatric/Behavioral: Negative for depression and suicidal ideas. The patient does not have insomnia.     Allergies  Allergen Reactions  . Ioversol Anxiety, Hives, Itching and Palpitations  .  Ciprofloxacin Other (See Comments)  . Eggs Or Egg-Derived Products Other (See Comments)    Exacerbates neuralgia  symptoms Exacerbates neuralgia symptoms Congestion & Headaches Exacerbates neuralgia symptoms Congestion & Headaches   . Ginger Other (See Comments)    Congestion & Headaches Congestion & Headaches   . Levofloxacin Other (See Comments)    Congestion & Headaches Congestion & Headaches   . Other Other (See Comments)    Novocaine (IV) form Hearing loss Novocaine (IV) form Hearing loss  Congestion & Headaches Congestion & Headaches Congestion & Headaches Congestion & Headaches   . Soybean-Containing Drug Products Other (See Comments)    Congestion & Headaches   . Bee Pollen   . Bupropion   . Celebrex [Celecoxib]   . Codeine   . Dexilant [Dexlansoprazole]   . Dulera [Mometasone Furo-Formoterol Fum]   . Fentanyl   . Fluticasone   . Gabapentin   . Gluten Meal   . Iodine   . Lidocaine     Rash to topical cream  . Lyrica [Pregabalin]   . Mold Extract [Trichophyton]   . Morphine And Related   . Penicillins     Skin testing positive for allergy  . Pollen Extract   . Prilosec [Omeprazole]   . Rizatriptan Other (See Comments)    Dizziness, headache, myalgias, nausea  . Salmeterol Xinafoate   . Silenor [Doxepin Hcl]   . Tomato   . Ethanol Dermatitis, Other (See Comments), Itching and Palpitations  . Hydrocodone Nausea Only  . Ibuprofen Nausea Only    At high doses At high doses   . Lac Bovis Diarrhea    Facial burning sensation Facial burning sensation Facial burning sensation Facial burning sensation   . Lanolin Other (See Comments)    Exacerbates neuralgia symptoms Wool causes hives.   . Latex Other (See Comments)    Other Reaction: Other reaction "sensitivity" Other Reaction: Other reaction "sensitivity"   . Milk-Related Compounds Diarrhea    Facial burning sensation   . Wheat Bran Diarrhea    Past Medical History:  Diagnosis Date  . Allergy   . Anemia   . Anxiety   . Arthritis   . Asthma   . Basal cell carcinoma   . BRCA negative 09/2018    2015 [place] at [facility]; has VUS; 1/20 MyRisk neg except RPS20 VUS; IBIS=10.8%/riskscore=12%  . Colon cancer ([place])   . Dysautonomia ([place])   . Ehlers-Danlos syndrome type III   . Family history of colon cancer   . GERD (gastroesophageal reflux disease)   . Hyperlipidemia   . Idiopathic small fiber peripheral neuropathy   . Leiomyoma   . Mast cell activation ([place])   . Migraines   . Neck pain   . Occipital neuralgia   . Sleep apnea     Past Surgical History:  Procedure Laterality Date  . APPENDECTOMY     removed with colectomy  . BREAST SURGERY     reduction  . COLON SURGERY  2008   for colon cancer, ~1/3 of colon removed, primary reanastimosis  . COLONOSCOPY WITH PROPOFOL N/A 10/07/2018   Procedure: COLONOSCOPY WITH PROPOFOL;  Surgeon: Lin Landsman, MD;  Location: [facility] ENDOSCOPY;  Service: Gastroenterology;  Laterality: N/A;  . ENDOMETRIAL ABLATION      Social History   Socioeconomic History  . Marital status: Married    Spouse name: Not on file  . Number of children: 0  . Years of education: Not on file  . Highest education level: Not  on file  Occupational History  . Occupation: SSI and disability  Tobacco Use  . Smoking status: Never Smoker  . Smokeless tobacco: Never Used  Substance and Sexual Activity  . Alcohol use: Never  . Drug use: Never  . Sexual activity: Not Currently    Birth control/protection: None  Other Topics Concern  . Not on file  Social History Narrative  . Not on file   Social Determinants of Health   Financial Resource Strain:   . Difficulty of Paying Living Expenses: Not on file  Food Insecurity:   . Worried About Charity fundraiser in the Last Year: Not on file  . Ran Out of Food in the Last Year: Not on file  Transportation Needs:   . Lack of Transportation (Medical): Not on file  . Lack of Transportation (Non-Medical): Not on file  Physical Activity:   . Days of Exercise per Week: Not on file  . Minutes of Exercise per Session:  Not on file  Stress:   . Feeling of Stress : Not on file  Social Connections:   . Frequency of Communication with Friends and Family: Not on file  . Frequency of Social Gatherings with Friends and Family: Not on file  . Attends Religious Services: Not on file  . Active Member of Clubs or Organizations: Not on file  . Attends Archivist Meetings: Not on file  . Marital Status: Not on file  Intimate Partner Violence:   . Fear of Current or Ex-Partner: Not on file  . Emotionally Abused: Not on file  . Physically Abused: Not on file  . Sexually Abused: Not on file    Family History  Problem Relation Age of Onset  . Colon cancer Father 30  . Breast cancer Maternal Aunt 50       BRCA neg  . Endometrial cancer Maternal Aunt 50  . Colon cancer Maternal Grandmother 50  . Migraines Maternal Grandmother   . Tremor Maternal Grandmother   . Leukemia Maternal Grandfather 69  . Food Allergy Maternal Grandfather   . Migraines Mother   . Glaucoma Mother   . Cataracts Mother   . Tremor Mother   . Fibromyalgia Mother   . Heart disease Mother   . Colon polyps Brother        non-cancerous     Current Outpatient Medications:  .  acetaminophen (TYLENOL) 500 MG tablet, Take 500 mg by mouth every 6 (six) hours as needed., Disp: , Rfl:  .  albuterol (PROVENTIL HFA;VENTOLIN HFA) 108 (90 Base) MCG/ACT inhaler, Inhale into the lungs., Disp: , Rfl:  .  amitriptyline (ELAVIL) 10 MG tablet, Take 1 tablet (10 mg total) by mouth at bedtime., Disp: 60 tablet, Rfl: 5 .  Ascorbic Acid (VITAMIN C) 1000 MG tablet, Take 1,000 mg by mouth daily. Up to 1250/qd, Disp: , Rfl:  .  aspirin 325 MG tablet, Take by mouth., Disp: , Rfl:  .  augmented betamethasone dipropionate (DIPROLENE-AF) 0.05 % cream, APPLY TWICE DAILY AS NEEDED TO THE AFFECTED AREAS FOR 5-7 DAYS FOR ECZEMA., Disp: , Rfl:  .  b complex vitamins capsule, Take 1 capsule by mouth daily., Disp: , Rfl:  .  BELSOMRA 5 MG TABS, Take 1 tablet by  mouth at bedtime., Disp: , Rfl:  .  Blood Pressure Monitoring (BLOOD PRESSURE CUFF) MISC, Take blood pressure as needed for symptoms., Disp: , Rfl:  .  Boswellia Serrata (BOSWELLIA PO), Take 150 mg by mouth every morning. ,  Disp: , Rfl:  .  calcium citrate (CALCITRATE - DOSED IN MG ELEMENTAL CALCIUM) 950 (200 Ca) MG tablet, Take 1 tablet by mouth in the morning and at bedtime., Disp: , Rfl:  .  CREON 6000 units CPEP, TAKE 2 CAPSULES WITH EACH MEAL AND TAKE 1 CAPSULE WITH EACH SNACK, Disp: 120 capsule, Rfl: 1 .  cromolyn (GASTROCROM) 100 MG/5ML solution, Take by mouth 4 (four) times daily -  before meals and at bedtime., Disp: , Rfl:  .  desloratadine (CLARINEX) 5 MG tablet, , Disp: , Rfl:  .  diphenhydrAMINE (BENADRYL) 12.5 MG/5ML liquid, Take by mouth 4 (four) times daily as needed., Disp: , Rfl:  .  diphenhydrAMINE-Allantoin 2-0.5 % CREA, Apply 1 application topically as needed. , Disp: , Rfl:  .  docusate sodium (COLACE) 100 MG capsule, Take 100 mg by mouth as needed. , Disp: , Rfl:  .  econazole nitrate 1 % cream, APPLY TO FEET TWICE A DAY FOR 2 4 WEEKS AS NEEDED FOR FLARE, Disp: , Rfl:  .  EPINEPHrine 0.3 mg/0.3 mL IJ SOAJ injection, INJECT ONE AUTO-INJECTOR INTRAMUSCULARLY AS NEEDED, Disp: , Rfl:  .  estradiol (ESTRACE) 0.1 MG/GM vaginal cream, PLACE 1 APPLICATORFUL VAGINALLY 2 (TWO) TIMES A WEEK., Disp: 42.5 g, Rfl: 1 .  famotidine (PEPCID) 10 MG tablet, Take 10 mg by mouth daily. , Disp: , Rfl:  .  fexofenadine (ALLEGRA) 180 MG tablet, Take 180 mg by mouth daily as needed. , Disp: , Rfl:  .  Gelatin Capsules, Empty, (EMPTY CAPSULE SIZE 3 CLEAR) CAPS, Take 1 tablet by mouth daily., Disp: , Rfl:  .  guaifenesin (HUMIBID E) 400 MG TABS tablet, Take 400 mg by mouth daily. , Disp: , Rfl:  .  hydrOXYzine (ATARAX/VISTARIL) 50 MG tablet, Take 50 mg by mouth daily. , Disp: , Rfl:  .  ketotifen (ZADITOR) 0.025 % ophthalmic solution, Administer 1 drop to both eyes Two (2) times a day. And also take 4  drops ORALLY up to 4 times daily as needed., Disp: , Rfl:  .  Ketotifen Fumarate POWD, Take by mouth., Disp: , Rfl:  .  L-Lysine 500 MG CAPS, Take by mouth 2 (two) times daily., Disp: , Rfl:  .  L-Theanine 100 MG CAPS, Take 200 mg by mouth daily. , Disp: , Rfl:  .  levocetirizine (XYZAL) 5 MG tablet, Take 1 tablet (5 mg total) by mouth 2 (two) times daily., Disp: 180 tablet, Rfl: 3 .  Light Mineral Oil-Mineral Oil 0.5-0.5 % EMUL, Apply 1 application to eye daily. , Disp: , Rfl:  .  loperamide (IMODIUM) 2 MG capsule, Take by mouth as needed for diarrhea or loose stools., Disp: , Rfl:  .  Magnesium Citrate POWD, Take 435 mg by mouth daily. , Disp: , Rfl:  .  Melatonin 1 MG/ML LIQD, Take by mouth daily. , Disp: , Rfl:  .  montelukast (SINGULAIR) 10 MG tablet, Take 10 mg by mouth daily. , Disp: , Rfl:  .  Nerve Stimulator (CEFALY KIT) DEVI, , Disp: , Rfl:  .  Non Gelatin Capsules, Empty, (CAPSULE #3 CLEAR/CLEAR VEG) CAPS, Take 1 capsule by mouth daily., Disp: , Rfl:  .  omega-3 acid ethyl esters (LOVAZA) 1 g capsule, 4 g., Disp: , Rfl:  .  polyethylene glycol (MIRALAX / GLYCOLAX) packet, Take 17 g by mouth daily as needed. , Disp: , Rfl:  .  pseudoephedrine (SUDAFED) 30 MG tablet, Take 30 mg by mouth every 4 (four) hours as  needed for congestion., Disp: , Rfl:  .  Psyllium 400 MG CAPS, Take by mouth as needed. , Disp: , Rfl:  .  RESTASIS 0.05 % ophthalmic emulsion, INSTILL 1 DROP INTO BOTH EYES TWICE A DAY, Disp: 180 mL, Rfl: 1 .  simethicone (MYLICON) 80 MG chewable tablet, Chew 80 mg by mouth every 6 (six) hours as needed for flatulence., Disp: , Rfl:  .  Sodium Fluoride (PREVIDENT 5000 BOOSTER) 1.1 % PSTE, Place onto teeth., Disp: , Rfl:  .  TRIAMCINOLONE ACETONIDE,NASAL, NA, Place into the nose as needed. , Disp: , Rfl:  .  Turmeric & Tamarind Blend TABS, Take 1 tablet by mouth daily., Disp: , Rfl:  .  UBRELVY 50 MG TABS, TAKE 1 TABLET BY MOUTH ONCE AS NEEDED FOR UP TO 1 DOSE, Disp: , Rfl:  .   Vitamin D, Cholecalciferol, 10 MCG (400 UNIT) TABS, Take by mouth daily. , Disp: , Rfl:  .  vitamin k 100 MCG tablet, Take 100 mcg by mouth every other day. , Disp: , Rfl:  .  ZENPEP 10000-32000 units CPEP, TAKE 1 CAPSULE BY MOUTH 3 TIMES A DAY WITH MEALS AS NEEDED, Disp: 270 capsule, Rfl: 0  No results found.  No images are attached to the encounter.   CMP Latest Ref Rng & Units 03/31/2019  Glucose 65 - 99 mg/dL 94  BUN 6 - 24 mg/dL 9  Creatinine 0.57 - 1.00 mg/dL 0.56(L)  Sodium 134 - 144 mmol/L 143  Potassium 3.5 - 5.2 mmol/L 4.2  Chloride 96 - 106 mmol/L 104  Calcium 8.7 - 10.2 mg/dL 9.7  Total Protein 6.0 - 8.5 g/dL 6.9  Total Bilirubin 0.0 - 1.2 mg/dL 0.5  Alkaline Phos 39 - 117 IU/L 86  AST 0 - 40 IU/L 20  ALT 0 - 32 IU/L 17   CBC Latest Ref Rng & Units 11/18/2019  WBC 4.0 - 10.5 K/uL 6.3  Hemoglobin 12.0 - 15.0 g/dL 11.9(L)  Hematocrit 36.0 - 46.0 % 38.0  Platelets 150 - 400 K/uL 276     Observation/objective: Appears in no acute distress over video visit today.  Breathing is nonlabored  Assessment and plan: Patient is a 49 year old female with iron deficiency anemia here for routine follow-up  Patient has mild anemia with a hemoglobin of 11.9.  Iron studies are within normal limits.  Repeat CBC ferritin and iron studies in 3 in 6 months and I will see her in 6 months.  I will also check a B12, reticulocyte count and haptoglobin in 3 months   Follow-up instructions: As above  I discussed the assessment and treatment plan with the patient. The patient was provided an opportunity to ask questions and all were answered. The patient agreed with the plan and demonstrated an understanding of the instructions.   The patient was advised to call back or seek an in-person evaluation if the symptoms worsen or if the condition fails to improve as anticipated.    Visit Diagnosis: 1. Iron deficiency anemia, unspecified iron deficiency anemia type     Dr. Randa Evens, MD,  [facility] Tel- [phone] 11/22/2019 10:48 AM

## 2019-11-23 ENCOUNTER — Telehealth (INDEPENDENT_AMBULATORY_CARE_PROVIDER_SITE_OTHER): Payer: No Typology Code available for payment source | Admitting: Family Medicine

## 2019-11-23 ENCOUNTER — Encounter: Payer: Self-pay | Admitting: Family Medicine

## 2019-11-23 VITALS — BP 113/74 | HR 74 | Temp 97.4°F | Wt 134.0 lb

## 2019-11-23 DIAGNOSIS — R600 Localized edema: Secondary | ICD-10-CM | POA: Diagnosis not present

## 2019-11-23 DIAGNOSIS — M2550 Pain in unspecified joint: Secondary | ICD-10-CM

## 2019-11-23 DIAGNOSIS — Z7189 Other specified counseling: Secondary | ICD-10-CM

## 2019-11-23 DIAGNOSIS — M7989 Other specified soft tissue disorders: Secondary | ICD-10-CM | POA: Diagnosis not present

## 2019-11-23 DIAGNOSIS — Z7185 Encounter for immunization safety counseling: Secondary | ICD-10-CM

## 2019-11-23 DIAGNOSIS — M8580 Other specified disorders of bone density and structure, unspecified site: Secondary | ICD-10-CM

## 2019-11-23 DIAGNOSIS — E618 Deficiency of other specified nutrient elements: Secondary | ICD-10-CM

## 2019-11-23 NOTE — Patient Instructions (Signed)
Calcium Content in Foods Calcium is the most abundant mineral in your body. Most of your body's calcium supply is stored in your bones and teeth. Calcium helps many parts of the body function normally, including:  Blood and blood vessels.  Nerves.  Hormones.  Muscles.  Bones and teeth. When your calcium stores are low, you may be at risk for low bone mass, bone loss, and broken bones (fractures). When you get enough calcium, it helps to support strong bones and teeth throughout your life. Calcium is especially important for:  Children during growth spurts.  Girls during adolescence.  Women who are pregnant or breastfeeding.  Women after their menstrual cycle stops (postmenopause).  Women whose menstrual cycle has stopped due to anorexia nervosa or regular intense exercise.  People who cannot eat or digest dairy products.  Vegans. What are tips for getting more calcium? General information  Try to get most of your calcium from food. Eat foods that are high in calcium.  Some people may benefit from taking calcium supplements. Check with your health care provider or diet and nutrition specialist (dietitian) before starting any calcium supplements. Calcium supplements may interact with certain medicines. Too much calcium may cause other health problems, like constipation and kidney stones.  For the body to absorb calcium, it needs vitamin D. Sources of vitamin D include: ? Skin exposure to direct sunlight. ? Foods, such as egg yolks, liver, saltwater fish, and fortified milk. ? Vitamin D supplements. Check with your health care provider or dietitian before starting any vitamin D supplements. What foods are high in calcium?  High-calcium foods are those that contain more than 100 milligrams (mg) of calcium per serving. Fruits  Fortified orange or other fruit juice, 300 mg per 8 oz serving. Vegetables  Collard greens, 360 mg per 8 oz serving.  Kale, 180 mg per 8 oz  serving.  Bok choy, 160 mg per 8 oz serving. Grains  Fortified ready-to-eat cereals, 100-1,000 mg per 8 oz serving.  Fortified frozen waffles, 200 mg in two waffles. Meats and other proteins  Sardines, canned with bones, 325 mg per 3 oz serving.  Salmon, canned with bones, 180 mg per 3 oz serving.  Canned shrimp, 125 mg per 3 oz serving.  Baked beans, 160 mg per 4 oz serving. Dairy  Yogurt, plain, low-fat, 310 mg per 6 oz serving.  Milk, 300 mg per 8 oz serving.  American cheese, 195 mg per 1 oz serving.  Cheddar cheese, 205 mg per 1 oz serving.  Cottage cheese 2%, 105 mg per 4 oz serving.  Fortified soy, rice, or almond milk, 300 mg per 8 oz serving. The items listed above may not be a complete list of foods high in calcium. Actual amounts of calcium may be different depending on processing. Contact a dietitian for more information. What foods are lower in calcium? Foods lower in calcium are those that contain 50 mg of calcium or less per serving. Fruits  Apple, about 6 mg in one apple.  Banana, about 12 mg in one banana. Vegetables  Lettuce, 19 mg per 2 oz serving.  Tomato, about 11 mg in one tomato. Grains  Rice, 4 mg per 6 oz serving.  Boiled potatoes, 14 mg per 8 oz serving.  White bread, 6 mg in one slice. Meats and other proteins  Egg, 27 mg per 2 oz serving.  Red meat, 7 mg per 4 oz serving.  Chicken, 17 mg per 4 oz serving.  Fish, cod  or trout, 20 mg per 4 oz serving. The items listed above may not be a complete list of foods lower in calcium. Actual amounts of calcium may be different depending on processing. Contact a dietitian for more information. Summary  Calcium is an important mineral in the body because it affects many functions. Getting enough calcium helps support strong bones and teeth throughout your life.  Try to get most of your calcium from food.  Calcium supplements may interact with certain medicines. Check with your health  care provider before starting any calcium supplements. This information is not intended to replace advice given to you by your health care provider. Make sure you discuss any questions you have with your health care provider. Document Revised: 08/25/2017 Document Reviewed: 08/25/2017 Elsevier Patient Education  2020 Elsevier Inc.  

## 2019-11-23 NOTE — Telephone Encounter (Signed)
[facility] would likely be able to do shipping to home.  Let's try to send the written Rx (will bring to you) to them.  The pre-approval likely needs to be done by the servicer, not [place].  Please let patient know when it has been sent. Thanks!

## 2019-11-26 DIAGNOSIS — F5104 Psychophysiologic insomnia: Secondary | ICD-10-CM | POA: Insufficient documentation

## 2019-11-28 ENCOUNTER — Other Ambulatory Visit: Payer: Self-pay | Admitting: *Deleted

## 2019-11-28 DIAGNOSIS — D509 Iron deficiency anemia, unspecified: Secondary | ICD-10-CM

## 2019-12-12 ENCOUNTER — Encounter: Payer: Self-pay | Admitting: Family Medicine

## 2019-12-21 ENCOUNTER — Encounter: Payer: Self-pay | Admitting: Family Medicine

## 2019-12-25 ENCOUNTER — Encounter: Payer: Self-pay | Admitting: Family Medicine

## 2019-12-26 MED ORDER — RESTASIS 0.05 % OP EMUL: OPHTHALMIC | 1 refills | Status: DC

## 2019-12-27 ENCOUNTER — Encounter: Payer: Self-pay | Admitting: Family Medicine

## 2019-12-27 DIAGNOSIS — M7989 Other specified soft tissue disorders: Secondary | ICD-10-CM

## 2019-12-27 DIAGNOSIS — M2550 Pain in unspecified joint: Secondary | ICD-10-CM

## 2019-12-27 NOTE — Telephone Encounter (Signed)
Rx ready in my office.  Please let patient know when faxed  Please keep copy in case we need to resend again

## 2019-12-28 NOTE — Telephone Encounter (Signed)
See new requested fax number for Rx and ok to send referral

## 2019-12-30 ENCOUNTER — Other Ambulatory Visit (INDEPENDENT_AMBULATORY_CARE_PROVIDER_SITE_OTHER): Payer: Self-pay | Admitting: Family Medicine

## 2020-01-04 LAB — TSH: TSH: 2.06 (ref <=5.90)

## 2020-01-04 LAB — CBC AND DIFFERENTIAL
HCT: 40 (ref 36–46)
Hemoglobin: 12.6 (ref 12.0–16.0)
Platelets: 304 (ref 150–399)
WBC: 7.4

## 2020-01-04 LAB — BASIC METABOLIC PANEL
BUN: 17 (ref 4–21)
CO2: 35 — AB (ref 13–22)
Chloride: 107 (ref 99–108)
Creatinine: 0.5 (ref <=1.1)
Glucose: 90
Potassium: 4 (ref 3.4–5.3)
Sodium: 145 (ref 137–147)

## 2020-01-04 LAB — COMPREHENSIVE METABOLIC PANEL
Albumin: 4.2 (ref 3.5–5.0)
Calcium: 9 (ref 8.7–10.7)
GFR calc Af Amer: 167.1
GFR calc non Af Amer: 138.1
Globulin: 3.3

## 2020-01-04 LAB — HEPATIC FUNCTION PANEL
ALT: 28 (ref 7–35)
AST: 19 (ref 13–35)
Alkaline Phosphatase: 104 (ref 25–125)
Bilirubin, Total: 0.4

## 2020-01-04 LAB — VITAMIN D 25 HYDROXY (VIT D DEFICIENCY, FRACTURES): Vit D, 25-Hydroxy: 30.9

## 2020-01-04 LAB — POCT ERYTHROCYTE SEDIMENTATION RATE, NON-AUTOMATED: Sed Rate: 34

## 2020-01-04 LAB — CBC: RBC: 4.24 (ref 3.87–5.11)

## 2020-01-16 ENCOUNTER — Other Ambulatory Visit (INDEPENDENT_AMBULATORY_CARE_PROVIDER_SITE_OTHER): Payer: Self-pay | Admitting: Pediatric Infectious Disease

## 2020-01-20 ENCOUNTER — Telehealth: Payer: Self-pay

## 2020-01-20 LAB — LAB REPORT - SCANNED
Albumin/Globulin Ratio: 1.3
BUN/Creatinine Ratio: 34
Basophil %: 0.5
Eosinophil %: 1.1
MCH: 29.7
MCHC: 31.6
MCV: 94.1 (ref 76–111)
MPV: 10 fL (ref 7.5–11.5)
Monocyte %: 7
Neutrophil %: 69.2
RDW: 12.2
Total Protein: 7.5 (ref 6.4–8.2)
Uric Acid: 2.9

## 2020-01-20 NOTE — Telephone Encounter (Signed)
Lab results abstracted into chart.

## 2020-01-20 NOTE — Telephone Encounter (Signed)
-----   Message from Virginia Crews, MD sent at 01/20/2020  8:24 AM EDT ----- There are some labs in here to abstract ----- Message ----- From: Neva Seat Sent: 01/20/2020   8:18 AM EDT To: Virginia Crews, MD  Please review incoming fax.

## 2020-01-25 ENCOUNTER — Encounter: Payer: Self-pay | Admitting: Obstetrics and Gynecology

## 2020-01-25 DIAGNOSIS — Z780 Asymptomatic menopausal state: Secondary | ICD-10-CM

## 2020-01-26 ENCOUNTER — Other Ambulatory Visit: Payer: Self-pay

## 2020-01-26 ENCOUNTER — Other Ambulatory Visit: Payer: No Typology Code available for payment source

## 2020-01-26 DIAGNOSIS — Z780 Asymptomatic menopausal state: Secondary | ICD-10-CM

## 2020-01-27 LAB — FOLLICLE STIMULATING HORMONE: FSH: 65.5 m[IU]/mL

## 2020-01-27 LAB — ESTRADIOL: Estradiol: 7.9 pg/mL

## 2020-02-15 ENCOUNTER — Encounter: Payer: Self-pay | Admitting: Family Medicine

## 2020-02-15 DIAGNOSIS — M8580 Other specified disorders of bone density and structure, unspecified site: Secondary | ICD-10-CM

## 2020-02-16 NOTE — Telephone Encounter (Signed)
Ok to send order for DEXA. Rx written and will leave up front for her to pick up

## 2020-02-21 ENCOUNTER — Inpatient Hospital Stay: Payer: No Typology Code available for payment source | Attending: Oncology

## 2020-02-21 ENCOUNTER — Other Ambulatory Visit: Payer: Self-pay

## 2020-02-21 DIAGNOSIS — D509 Iron deficiency anemia, unspecified: Secondary | ICD-10-CM | POA: Diagnosis present

## 2020-02-21 LAB — CBC
HCT: 39.6 % (ref 36.0–46.0)
Hemoglobin: 12.8 g/dL (ref 12.0–15.0)
MCH: 29.4 pg (ref 26.0–34.0)
MCHC: 32.3 g/dL (ref 30.0–36.0)
MCV: 91 fL (ref 80.0–100.0)
Platelets: 292 10*3/uL (ref 150–400)
RBC: 4.35 MIL/uL (ref 3.87–5.11)
RDW: 12.5 % (ref 11.5–15.5)
WBC: 6.5 10*3/uL (ref 4.0–10.5)
nRBC: 0 % (ref 0.0–0.2)

## 2020-02-21 LAB — RETICULOCYTES
Immature Retic Fract: 5.4 % (ref 2.3–15.9)
RBC.: 4.35 MIL/uL (ref 3.87–5.11)
Retic Count, Absolute: 48.7 10*3/uL (ref 19.0–186.0)
Retic Ct Pct: 1.1 % (ref 0.4–3.1)

## 2020-02-21 LAB — FERRITIN: Ferritin: 128 ng/mL (ref 11–307)

## 2020-02-21 LAB — IRON AND TIBC
Iron: 86 ug/dL (ref 28–170)
Saturation Ratios: 22 % (ref 10.4–31.8)
TIBC: 395 ug/dL (ref 250–450)
UIBC: 309 ug/dL

## 2020-02-21 LAB — VITAMIN B12: Vitamin B-12: 284 pg/mL (ref 180–914)

## 2020-02-21 NOTE — Progress Notes (Signed)
I called pt and she is taking a super B capsule and she only take a part of the capsule each day. It takes the whole week to get the 1 capsule complete. It adds up to 7 mcg every day per pt. She has b12 liq- she will start taking it 1 dropper a day which should equal 1000 mcg of b12 every day.

## 2020-02-22 LAB — HAPTOGLOBIN: Haptoglobin: 95 mg/dL (ref 42–296)

## 2020-03-13 ENCOUNTER — Telehealth: Payer: Self-pay

## 2020-03-13 NOTE — Telephone Encounter (Signed)
Copied from [facility] [id number]. Topic: General - Inquiry >> Mar 13, 2020  4:26 PM Leim Fabry wrote: Reason for CRM:  Patient calling to request an update on bone density order. Patient was waiting on phone call from the referral coordinator; received email on 02/16/20.

## 2020-03-14 ENCOUNTER — Ambulatory Visit: Payer: No Typology Code available for payment source | Admitting: Podiatry

## 2020-03-14 NOTE — Telephone Encounter (Signed)
Order faxed to [facility].Pt will call to schedule

## 2020-03-14 NOTE — Telephone Encounter (Signed)
Jasmine Pang do you have an update on this?  Thanks,   -Mickel Baas

## 2020-03-15 ENCOUNTER — Ambulatory Visit (INDEPENDENT_AMBULATORY_CARE_PROVIDER_SITE_OTHER): Payer: No Typology Code available for payment source | Admitting: Podiatry

## 2020-03-15 ENCOUNTER — Ambulatory Visit (INDEPENDENT_AMBULATORY_CARE_PROVIDER_SITE_OTHER): Payer: No Typology Code available for payment source

## 2020-03-15 ENCOUNTER — Other Ambulatory Visit: Payer: Self-pay

## 2020-03-15 DIAGNOSIS — M722 Plantar fascial fibromatosis: Secondary | ICD-10-CM

## 2020-03-15 DIAGNOSIS — M79672 Pain in left foot: Secondary | ICD-10-CM

## 2020-03-15 DIAGNOSIS — M79671 Pain in right foot: Secondary | ICD-10-CM

## 2020-03-15 DIAGNOSIS — Q666 Other congenital valgus deformities of feet: Secondary | ICD-10-CM | POA: Diagnosis not present

## 2020-03-16 ENCOUNTER — Encounter: Payer: Self-pay | Admitting: Podiatry

## 2020-03-16 NOTE — Progress Notes (Signed)
Subjective:  Patient ID: Autumn Zhang, female    DOB: [birth year],  MRN: [id number]  Chief Complaint  Patient presents with  . Foot Pain    pt is here for bil arch pain, possible plantar fasciitis    49 y.o. female presents with the above complaint.  Patient presents with complaint of bilateral heel pain that started progressive gotten worse.  Patient states is painful to walk on.  It has been going on for years.  It itches a lot as well as hurts a lot.  She states that sometimes it is redness associated with it.  She is tried some over-the-counter therapies which has not helped.  She has not seen anyone else prior to seeing me.  She would like to discuss treatment options for heel pain.  She denies any kind of orthotics.  Patient has history of Ehlers-Danlos with ligament laxity.  She does not wearing counter bracing for it.   Review of Systems: Negative except as noted in the HPI. Denies N/V/F/Ch.  Past Medical History:  Diagnosis Date  . Allergy   . Anemia   . Anxiety   . Arthritis   . Asthma   . Basal cell carcinoma   . BRCA negative 09/2018   2015 [place] at [facility]; has VUS; 1/20 MyRisk neg except RPS20 VUS; IBIS=10.8%/riskscore=12%  . Colon cancer ([place])   . Dysautonomia ([place])   . Ehlers-Danlos syndrome type III   . Family history of colon cancer   . GERD (gastroesophageal reflux disease)   . Hyperlipidemia   . Idiopathic small fiber peripheral neuropathy   . Leiomyoma   . 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 Migraines   . Neck pain   . Occipital neuralgia   . Sleep apnea     Current Outpatient Medications:  .  acetaminophen (TYLENOL) 500 MG tablet, Take 500 mg by mouth every 6 (six) hours as needed., Disp: , Rfl:  .  albuterol (PROVENTIL HFA;VENTOLIN HFA) 108 (90 Base) MCG/ACT inhaler, Inhale into the lungs., Disp: , Rfl:  .  Ascorbic Acid (VITAMIN C) 1000 MG tablet, Take 1,000 mg by mouth daily. Up to 1250/qd, Disp: , Rfl:  .  aspirin 325 MG tablet, Take by mouth.,  Disp: , Rfl:  .  augmented betamethasone dipropionate (DIPROLENE-AF) 0.05 % cream, APPLY TWICE DAILY AS NEEDED TO THE AFFECTED AREAS FOR 5-7 DAYS FOR ECZEMA., Disp: , Rfl:  .  b complex vitamins capsule, Take 1 capsule by mouth daily., Disp: , Rfl:  .  BELSOMRA 5 MG TABS, Take 1 tablet by mouth at bedtime., Disp: , Rfl:  .  Blood Pressure Monitoring (BLOOD PRESSURE CUFF) MISC, Take blood pressure as needed for symptoms., Disp: , Rfl:  .  Boswellia Serrata (BOSWELLIA PO), Take 150 mg by mouth every morning. , Disp: , Rfl:  .  calcium citrate (CALCITRATE - DOSED IN MG ELEMENTAL CALCIUM) 950 (200 Ca) MG tablet, Take 0.5 tablets by mouth daily. , Disp: , Rfl:  .  CREON 6000 units CPEP, TAKE 2 CAPSULES WITH EACH MEAL AND TAKE 1 CAPSULE WITH EACH SNACK, Disp: 120 capsule, Rfl: 1 .  cromolyn (GASTROCROM) 100 MG/5ML solution, Take by mouth 4 (four) times daily -  before meals and at bedtime., Disp: , Rfl:  .  cycloSPORINE (RESTASIS) 0.05 % ophthalmic emulsion, INSTILL 1 DROP INTO BOTH EYES TWICE A DAY, Disp: 180 mL, Rfl: 1 .  desloratadine (CLARINEX) 5 MG tablet, 2.5 mg 2 (two) times daily. , Disp: , Rfl:  .  diphenhydrAMINE (BENADRYL) 12.5 MG/5ML liquid, Take by mouth 4 (four) times daily as needed., Disp: , Rfl:  .  diphenhydrAMINE-Allantoin 2-0.5 % CREA, Apply 1 application topically as needed. , Disp: , Rfl:  .  docusate sodium (COLACE) 100 MG capsule, Take 100 mg by mouth as needed. , Disp: , Rfl:  .  econazole nitrate 1 % cream, APPLY TO FEET TWICE A DAY FOR 2 4 WEEKS AS NEEDED FOR FLARE, Disp: , Rfl:  .  EPINEPHrine 0.3 mg/0.3 mL IJ SOAJ injection, INJECT ONE AUTO-INJECTOR INTRAMUSCULARLY AS NEEDED, Disp: , Rfl:  .  estradiol (ESTRACE) 0.1 MG/GM vaginal cream, PLACE 1 APPLICATORFUL VAGINALLY 2 (TWO) TIMES A WEEK., Disp: 42.5 g, Rfl: 1 .  famotidine (PEPCID) 10 MG tablet, Take 10 mg by mouth daily. , Disp: , Rfl:  .  fexofenadine (ALLEGRA) 180 MG tablet, Take 180 mg by mouth daily as needed. , Disp:  , Rfl:  .  guaifenesin (HUMIBID E) 400 MG TABS tablet, Take 400 mg by mouth daily. , Disp: , Rfl:  .  hydrOXYzine (ATARAX/VISTARIL) 50 MG tablet, Take 50 mg by mouth daily. , Disp: , Rfl:  .  ketotifen (ZADITOR) 0.025 % ophthalmic solution, Administer 1 drop to both eyes Two (2) times a day. And also take 4 drops ORALLY up to 4 times daily as needed., Disp: , Rfl:  .  Ketotifen Fumarate POWD, Take by mouth., Disp: , Rfl:  .  L-Lysine 500 MG CAPS, Take by mouth 2 (two) times daily., Disp: , Rfl:  .  L-Theanine 100 MG CAPS, Take 200 mg by mouth daily. , Disp: , Rfl:  .  levocetirizine (XYZAL) 5 MG tablet, Take 1 tablet (5 mg total) by mouth 2 (two) times daily., Disp: 180 tablet, Rfl: 3 .  Light Mineral Oil-Mineral Oil 0.5-0.5 % EMUL, Apply 1 application to eye daily. , Disp: , Rfl:  .  loperamide (IMODIUM) 2 MG capsule, Take by mouth as needed for diarrhea or loose stools., Disp: , Rfl:  .  Magnesium Citrate POWD, Take 435 mg by mouth daily. , Disp: , Rfl:  .  Melatonin 1 MG/ML LIQD, Take by mouth daily. , Disp: , Rfl:  .  montelukast (SINGULAIR) 10 MG tablet, Take 10 mg by mouth daily. , Disp: , Rfl:  .  Nerve Stimulator (CEFALY KIT) DEVI, , Disp: , Rfl:  .  Non Gelatin Capsules, Empty, (CAPSULE #3 CLEAR/CLEAR VEG) CAPS, Take 1 capsule by mouth daily., Disp: , Rfl:  .  omega-3 acid ethyl esters (LOVAZA) 1 g capsule, 4 g., Disp: , Rfl:  .  polyethylene glycol (MIRALAX / GLYCOLAX) packet, Take 17 g by mouth daily as needed. , Disp: , Rfl:  .  pseudoephedrine (SUDAFED) 30 MG tablet, Take 30 mg by mouth every 4 (four) hours as needed for congestion., Disp: , Rfl:  .  simethicone (MYLICON) 80 MG chewable tablet, Chew 80 mg by mouth every 6 (six) hours as needed for flatulence., Disp: , Rfl:  .  Sodium Fluoride (PREVIDENT 5000 BOOSTER) 1.1 % PSTE, Place onto teeth., Disp: , Rfl:  .  TRIAMCINOLONE ACETONIDE,NASAL, NA, Place into the nose as needed. , Disp: , Rfl:  .  UBRELVY 50 MG TABS, TAKE 1 TABLET  BY MOUTH ONCE AS NEEDED FOR UP TO 1 DOSE, Disp: , Rfl:  .  Vitamin D, Cholecalciferol, 10 MCG (400 UNIT) TABS, Take by mouth daily. , Disp: , Rfl:  .  vitamin k 100 MCG tablet, Take 100 mcg by  mouth every other day. , Disp: , Rfl:  .  amitriptyline (ELAVIL) 10 MG tablet, Take 1 tablet (10 mg total) by mouth at bedtime., Disp: 60 tablet, Rfl: 5 .  Turmeric & Tamarind Blend TABS, Take 1 tablet by mouth daily., Disp: , Rfl:   Social History   Tobacco Use  Smoking Status Never Smoker  Smokeless Tobacco Never Used    Allergies  Allergen Reactions  . Ioversol Anxiety, Hives, Itching and Palpitations  . Ciprofloxacin Other (See Comments)  . Eggs Or Egg-Derived Products Other (See Comments)    Exacerbates neuralgia symptoms Exacerbates neuralgia symptoms Congestion & Headaches Exacerbates neuralgia symptoms Congestion & Headaches   . Ginger Other (See Comments)    Congestion & Headaches Congestion & Headaches   . Levofloxacin Other (See Comments)    Congestion & Headaches Congestion & Headaches   . Other Other (See Comments)    Novocaine (IV) form Hearing loss Novocaine (IV) form Hearing loss  Congestion & Headaches Congestion & Headaches Congestion & Headaches Congestion & Headaches   . Soybean-Containing Drug Products Other (See Comments)    Congestion & Headaches   . Bee Pollen   . Bupropion   . Celebrex [Celecoxib]   . Codeine   . Dexilant [Dexlansoprazole]   . Dulera [Mometasone Furo-Formoterol Fum]   . Fentanyl   . Fluticasone   . Gabapentin   . Gluten Meal   . Iodine   . Lidocaine     Rash to topical cream  . Lyrica [Pregabalin]   . Mold Extract [Trichophyton]   . Morphine And Related   . Penicillins     Skin testing positive for allergy  . Pollen Extract   . Prilosec [Omeprazole]   . Rizatriptan Other (See Comments)    Dizziness, headache, myalgias, nausea  . Salmeterol Xinafoate   . Silenor [Doxepin Hcl]   . Stevioside Other (See Comments)  .  Tomato   . Xylitol Other (See Comments)  . Ethanol Dermatitis, Other (See Comments), Itching and Palpitations  . Hydrocodone Nausea Only  . Ibuprofen Nausea Only    At high doses At high doses   . Lac Bovis Diarrhea    Facial burning sensation Facial burning sensation Facial burning sensation Facial burning sensation   . Lanolin Other (See Comments)    Exacerbates neuralgia symptoms Wool causes hives.   . Latex Other (See Comments)    Other Reaction: Other reaction "sensitivity" Other Reaction: Other reaction "sensitivity"   . Milk-Related Compounds Diarrhea    Facial burning sensation   . Wheat Bran Diarrhea   Objective:  There were no vitals filed for this visit. There is no height or weight on file to calculate BMI. Constitutional Well developed. Well nourished.  Vascular Dorsalis pedis pulses palpable bilaterally. Posterior tibial pulses palpable bilaterally. Capillary refill normal to all digits.  No cyanosis or clubbing noted. Pedal hair growth normal.  Neurologic Normal speech. Oriented to person, place, and time. Epicritic sensation to light touch grossly present bilaterally.  Dermatologic Nails well groomed and normal in appearance. No open wounds. No skin lesions.  Orthopedic: Normal joint ROM without pain or crepitus bilaterally. No visible deformities. Tender to palpation at the calcaneal tuber bilaterally. No pain with calcaneal squeeze bilaterally. Ankle ROM diminished range of motion bilaterally. Silfverskiold Test: negative bilaterally.   Radiographs: Taken and reviewed. No acute fractures or dislocations. No evidence of stress fracture.  Plantar heel spur absent. Posterior heel spur absent.   Assessment:   1. Foot pain,  bilateral    Plan:  Patient was evaluated and treated and all questions answered.  Plantar Fasciitis, bilaterally - XR reviewed as above.  - Educated on icing and stretching. Instructions given.  - Injection delivered to  the plantar fascia as below. 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  Given that patient is developing plantar fasciitis weight in the setting of pes planus valgus deformity I believe she will benefit from custom-made orthotics to help support the arch of the foot control the hindfoot motion. -She will be scheduled to see [facility] for custom-made orthotics.  Procedure: Injection Tendon/Ligament Location: Bilateral plantar fascia at the glabrous junction; medial approach. Skin Prep: alcohol Injectate: 0.5 cc 0.5% marcaine plain, 0.5 cc of 1% Lidocaine, 0.5 cc kenalog 10. Disposition: Patient tolerated procedure well. Injection site dressed with a band-aid.  No follow-ups on file.

## 2020-03-20 ENCOUNTER — Other Ambulatory Visit: Payer: Self-pay | Admitting: Podiatry

## 2020-03-20 DIAGNOSIS — M722 Plantar fascial fibromatosis: Secondary | ICD-10-CM

## 2020-03-21 ENCOUNTER — Other Ambulatory Visit: Payer: No Typology Code available for payment source | Admitting: Orthotics

## 2020-04-04 LAB — HM MAMMOGRAPHY

## 2020-04-04 LAB — HM DEXA SCAN

## 2020-04-13 ENCOUNTER — Other Ambulatory Visit: Payer: Self-pay | Admitting: Obstetrics and Gynecology

## 2020-04-13 DIAGNOSIS — N898 Other specified noninflammatory disorders of vagina: Secondary | ICD-10-CM

## 2020-04-16 ENCOUNTER — Telehealth: Payer: Self-pay

## 2020-04-16 NOTE — Telephone Encounter (Signed)
Copied from [place] [id number]. Topic: General - Other >> Apr 16, 2020 11:39 AM Leward Quan A wrote: Reason for CRM: Patient have an appointment scheduled for her annual physical on 06/08/20 she states that she will be on Medicare come September and need to know can she schedule that appointment as a welcome to Medicare or does she need a separate visit for that. Please call Ph# [phone]

## 2020-04-16 NOTE — Telephone Encounter (Signed)
If is switching to something like [employer] or another private Medicare like [employer], etc, then she will still qualify for a CPE and the Welcome to Medicare will have to be on a different day. If she is transitioning to plain medicare, then it will be welcome to medicare.

## 2020-04-17 ENCOUNTER — Encounter: Payer: Self-pay | Admitting: Obstetrics and Gynecology

## 2020-04-17 NOTE — Telephone Encounter (Signed)
Patient was advised and had other questions about how insurance works if she keeps her primary insurance and has medicare so I send the call to front desk to further assist the patient.

## 2020-04-18 ENCOUNTER — Other Ambulatory Visit: Payer: Self-pay | Admitting: Obstetrics and Gynecology

## 2020-04-18 DIAGNOSIS — N898 Other specified noninflammatory disorders of vagina: Secondary | ICD-10-CM

## 2020-04-18 MED ORDER — ESTRADIOL 0.1 MG/GM VA CREA: TOPICAL_CREAM | VAGINAL | 0 refills | Status: DC

## 2020-04-18 NOTE — Progress Notes (Signed)
Rx RF. Didn't go through 04/15/20

## 2020-04-18 NOTE — Telephone Encounter (Signed)
Called pharmacy and verbal taken.

## 2020-04-20 ENCOUNTER — Encounter: Payer: Self-pay | Admitting: Family Medicine

## 2020-04-24 ENCOUNTER — Ambulatory Visit (INDEPENDENT_AMBULATORY_CARE_PROVIDER_SITE_OTHER): Payer: No Typology Code available for payment source | Admitting: Podiatry

## 2020-04-24 ENCOUNTER — Other Ambulatory Visit: Payer: Self-pay

## 2020-04-24 ENCOUNTER — Encounter: Payer: Self-pay | Admitting: Podiatry

## 2020-04-24 DIAGNOSIS — M722 Plantar fascial fibromatosis: Secondary | ICD-10-CM

## 2020-04-24 NOTE — Progress Notes (Signed)
Subjective:  Patient ID: Autumn Zhang, female    DOB: 1970-11-26,  MRN: [id number]  Chief Complaint  Patient presents with  . Plantar Fasciitis    "They are some better, the swelling is down but the braces caused pain in other places.  I don't think they helped much"    49 y.o. female presents with the above complaint.  Patient presents with a follow-up of bilateral plantar fasciitis.  Patient states her pain has healed completely.  She has been able to ambulate without any pain.  She does not have any more heel pain.  She states that there is some swelling present which has improved too.  She denies any other acute complaints.  She is 100% improved.  Review of Systems: Negative except as noted in the HPI. Denies N/V/F/Ch.  Past Medical History:  Diagnosis Date  . Allergy   . Anemia   . Anxiety   . Arthritis   . Asthma   . Basal cell carcinoma   . BRCA negative 09/2018   2015 [place] at [facility]; has VUS; 1/20 MyRisk neg except RPS20 VUS; IBIS=10.8%/riskscore=12%  . Colon cancer ([place])   . Dysautonomia ([place])   . Ehlers-Danlos syndrome type III   . Family history of colon cancer   . GERD (gastroesophageal reflux disease)   . Hyperlipidemia   . Idiopathic small fiber peripheral neuropathy   . Leiomyoma   . 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 Migraines   . Neck pain   . Occipital neuralgia   . Sleep apnea     Current Outpatient Medications:  .  acetaminophen (TYLENOL) 500 MG tablet, Take 500 mg by mouth every 6 (six) hours as needed., Disp: , Rfl:  .  albuterol (PROVENTIL HFA;VENTOLIN HFA) 108 (90 Base) MCG/ACT inhaler, Inhale into the lungs., Disp: , Rfl:  .  amitriptyline (ELAVIL) 10 MG tablet, Take 1 tablet (10 mg total) by mouth at bedtime., Disp: 60 tablet, Rfl: 5 .  Ascorbic Acid (VITAMIN C) 1000 MG tablet, Take 1,000 mg by mouth daily. Up to 1250/qd, Disp: , Rfl:  .  aspirin 325 MG tablet, Take by mouth., Disp: , Rfl:  .  augmented betamethasone dipropionate  (DIPROLENE-AF) 0.05 % cream, APPLY TWICE DAILY AS NEEDED TO THE AFFECTED AREAS FOR 5-7 DAYS FOR ECZEMA., Disp: , Rfl:  .  b complex vitamins capsule, Take 1 capsule by mouth daily., Disp: , Rfl:  .  BELSOMRA 5 MG TABS, Take 1 tablet by mouth at bedtime., Disp: , Rfl:  .  Blood Pressure Monitoring (BLOOD PRESSURE CUFF) MISC, Take blood pressure as needed for symptoms., Disp: , Rfl:  .  Boswellia Serrata (BOSWELLIA PO), Take 150 mg by mouth every morning. , Disp: , Rfl:  .  calcium citrate (CALCITRATE - DOSED IN MG ELEMENTAL CALCIUM) 950 (200 Ca) MG tablet, Take 0.5 tablets by mouth daily. , Disp: , Rfl:  .  CREON 6000 units CPEP, TAKE 2 CAPSULES WITH EACH MEAL AND TAKE 1 CAPSULE WITH EACH SNACK, Disp: 120 capsule, Rfl: 1 .  cromolyn (GASTROCROM) 100 MG/5ML solution, Take by mouth 4 (four) times daily -  before meals and at bedtime., Disp: , Rfl:  .  cycloSPORINE (RESTASIS) 0.05 % ophthalmic emulsion, INSTILL 1 DROP INTO BOTH EYES TWICE A DAY, Disp: 180 mL, Rfl: 1 .  desloratadine (CLARINEX) 5 MG tablet, 2.5 mg 2 (two) times daily. , Disp: , Rfl:  .  diphenhydrAMINE (BENADRYL) 12.5 MG/5ML liquid, Take by mouth 4 (four) times  daily as needed., Disp: , Rfl:  .  diphenhydrAMINE-Allantoin 2-0.5 % CREA, Apply 1 application topically as needed. , Disp: , Rfl:  .  docusate sodium (COLACE) 100 MG capsule, Take 100 mg by mouth as needed. , Disp: , Rfl:  .  econazole nitrate 1 % cream, APPLY TO FEET TWICE A DAY FOR 2 4 WEEKS AS NEEDED FOR FLARE, Disp: , Rfl:  .  EPINEPHrine 0.3 mg/0.3 mL IJ SOAJ injection, INJECT ONE AUTO-INJECTOR INTRAMUSCULARLY AS NEEDED, Disp: , Rfl:  .  estradiol (ESTRACE) 0.1 MG/GM vaginal cream, PLACE  ONE APPLICATORFUL   VAGINALLY TWICE A WEEK., Disp: 42.5 g, Rfl: 0 .  famotidine (PEPCID) 10 MG tablet, Take 10 mg by mouth daily. , Disp: , Rfl:  .  fexofenadine (ALLEGRA) 180 MG tablet, Take 180 mg by mouth daily as needed. , Disp: , Rfl:  .  guaifenesin (HUMIBID E) 400 MG TABS tablet,  Take 400 mg by mouth daily. , Disp: , Rfl:  .  hydrOXYzine (ATARAX/VISTARIL) 50 MG tablet, Take 50 mg by mouth daily. , Disp: , Rfl:  .  ketotifen (ZADITOR) 0.025 % ophthalmic solution, Administer 1 drop to both eyes Two (2) times a day. And also take 4 drops ORALLY up to 4 times daily as needed., Disp: , Rfl:  .  Ketotifen Fumarate POWD, Take by mouth., Disp: , Rfl:  .  L-Lysine 500 MG CAPS, Take by mouth 2 (two) times daily., Disp: , Rfl:  .  L-Theanine 100 MG CAPS, Take 200 mg by mouth daily. , Disp: , Rfl:  .  levocetirizine (XYZAL) 5 MG tablet, Take 1 tablet (5 mg total) by mouth 2 (two) times daily., Disp: 180 tablet, Rfl: 3 .  Light Mineral Oil-Mineral Oil 0.5-0.5 % EMUL, Apply 1 application to eye daily. , Disp: , Rfl:  .  loperamide (IMODIUM) 2 MG capsule, Take by mouth as needed for diarrhea or loose stools., Disp: , Rfl:  .  Magnesium Citrate POWD, Take 435 mg by mouth daily. , Disp: , Rfl:  .  Melatonin 1 MG/ML LIQD, Take by mouth daily. , Disp: , Rfl:  .  montelukast (SINGULAIR) 10 MG tablet, Take 10 mg by mouth daily. , Disp: , Rfl:  .  Nerve Stimulator (CEFALY KIT) DEVI, , Disp: , Rfl:  .  Non Gelatin Capsules, Empty, (CAPSULE #3 CLEAR/CLEAR VEG) CAPS, Take 1 capsule by mouth daily., Disp: , Rfl:  .  omega-3 acid ethyl esters (LOVAZA) 1 g capsule, 4 g., Disp: , Rfl:  .  OTEZLA 30 MG TABS, Take 1 tablet by mouth 2 (two) times daily., Disp: , Rfl:  .  polyethylene glycol (MIRALAX / GLYCOLAX) packet, Take 17 g by mouth daily as needed. , Disp: , Rfl:  .  pseudoephedrine (SUDAFED) 30 MG tablet, Take 30 mg by mouth every 4 (four) hours as needed for congestion., Disp: , Rfl:  .  simethicone (MYLICON) 80 MG chewable tablet, Chew 80 mg by mouth every 6 (six) hours as needed for flatulence., Disp: , Rfl:  .  Sodium Fluoride (PREVIDENT 5000 BOOSTER) 1.1 % PSTE, Place onto teeth., Disp: , Rfl:  .  TRIAMCINOLONE ACETONIDE,NASAL, NA, Place into the nose as needed. , Disp: , Rfl:  .  Turmeric  & Tamarind Blend TABS, Take 1 tablet by mouth daily., Disp: , Rfl:  .  UBRELVY 50 MG TABS, TAKE 1 TABLET BY MOUTH ONCE AS NEEDED FOR UP TO 1 DOSE, Disp: , Rfl:  .  Vitamin D,  Cholecalciferol, 10 MCG (400 UNIT) TABS, Take by mouth daily. , Disp: , Rfl:  .  vitamin k 100 MCG tablet, Take 100 mcg by mouth every other day. , Disp: , Rfl:   Social History   Tobacco Use  Smoking Status Never Smoker  Smokeless Tobacco Never Used    Allergies  Allergen Reactions  . Ioversol Anxiety, Hives, Itching and Palpitations  . Ciprofloxacin Other (See Comments)  . Eggs Or Egg-Derived Products Other (See Comments)    Exacerbates neuralgia symptoms Exacerbates neuralgia symptoms Congestion & Headaches Exacerbates neuralgia symptoms Congestion & Headaches   . Ginger Other (See Comments)    Congestion & Headaches Congestion & Headaches   . Levofloxacin Other (See Comments)    Congestion & Headaches Congestion & Headaches   . Other Other (See Comments)    Novocaine (IV) form Hearing loss Novocaine (IV) form Hearing loss  Congestion & Headaches Congestion & Headaches Congestion & Headaches Congestion & Headaches   . Soybean-Containing Drug Products Other (See Comments)    Congestion & Headaches   . Bee Pollen   . Bupropion   . Celebrex [Celecoxib]   . Codeine   . Dexilant [Dexlansoprazole]   . Dulera [Mometasone Furo-Formoterol Fum]   . Fentanyl   . Fluticasone   . Gabapentin   . Gluten Meal   . Iodine   . Lidocaine     Rash to topical cream  . Lyrica [Pregabalin]   . Mold Extract [Trichophyton]   . Morphine And Related   . Penicillins     Skin testing positive for allergy  . Pollen Extract   . Prilosec [Omeprazole]   . Rizatriptan Other (See Comments)    Dizziness, headache, myalgias, nausea  . Salmeterol Xinafoate   . Silenor [Doxepin Hcl]   . Stevioside Other (See Comments)  . Tomato   . Xylitol Other (See Comments)  . Ethanol Dermatitis, Other (See Comments),  Itching and Palpitations  . Hydrocodone Nausea Only  . Ibuprofen Nausea Only    At high doses At high doses   . Lac Bovis Diarrhea    Facial burning sensation Facial burning sensation Facial burning sensation Facial burning sensation   . Lanolin Other (See Comments)    Exacerbates neuralgia symptoms Wool causes hives.   . Latex Other (See Comments)    Other Reaction: Other reaction "sensitivity" Other Reaction: Other reaction "sensitivity"   . Milk-Related Compounds Diarrhea    Facial burning sensation   . Wheat Bran Diarrhea   Objective:  There were no vitals filed for this visit. There is no height or weight on file to calculate BMI. Constitutional Well developed. Well nourished.  Vascular Dorsalis pedis pulses palpable bilaterally. Posterior tibial pulses palpable bilaterally. Capillary refill normal to all digits.  No cyanosis or clubbing noted. Pedal hair growth normal.  Neurologic Normal speech. Oriented to person, place, and time. Epicritic sensation to light touch grossly present bilaterally.  Dermatologic Nails well groomed and normal in appearance. No open wounds. No skin lesions.  Orthopedic: Normal joint ROM without pain or crepitus bilaterally. No visible deformities. No tender to palpation at the calcaneal tuber bilaterally. No pain with calcaneal squeeze bilaterally. Ankle ROM diminished range of motion bilaterally. Silfverskiold Test: negative bilaterally.   Radiographs: Taken and reviewed. No acute fractures or dislocations. No evidence of stress fracture.  Plantar heel spur absent. Posterior heel spur absent.   Assessment:   1. Plantar fasciitis of left foot   2. Plantar fasciitis of right foot  Plan:  Patient was evaluated and treated and all questions answered.  Plantar Fasciitis, bilaterally -Clinically healed.  No further pain on palpation to the area.  I have recommended continue doing stretching exercises.  Compression sleeve was  dispensed x2 for bilateral lower extremity edema.  I have asked her to come back and see me if any foot and ankle issues arise in the future.  Patient states understanding.  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  She has financial restrictions for them.  If this continues to hurt and recur we will consider orthotics at that time.   No follow-ups on file.

## 2020-05-08 ENCOUNTER — Encounter: Payer: Self-pay | Admitting: Family Medicine

## 2020-05-23 ENCOUNTER — Other Ambulatory Visit: Payer: Self-pay

## 2020-05-23 ENCOUNTER — Inpatient Hospital Stay: Payer: Medicare Other | Attending: Oncology

## 2020-05-23 DIAGNOSIS — Z9221 Personal history of antineoplastic chemotherapy: Secondary | ICD-10-CM | POA: Diagnosis not present

## 2020-05-23 DIAGNOSIS — Z85828 Personal history of other malignant neoplasm of skin: Secondary | ICD-10-CM | POA: Insufficient documentation

## 2020-05-23 DIAGNOSIS — D509 Iron deficiency anemia, unspecified: Secondary | ICD-10-CM | POA: Insufficient documentation

## 2020-05-23 DIAGNOSIS — Z85038 Personal history of other malignant neoplasm of large intestine: Secondary | ICD-10-CM | POA: Insufficient documentation

## 2020-05-23 DIAGNOSIS — E538 Deficiency of other specified B group vitamins: Secondary | ICD-10-CM | POA: Insufficient documentation

## 2020-05-23 LAB — CBC WITH DIFFERENTIAL/PLATELET
Abs Immature Granulocytes: 0.02 10*3/uL (ref 0.00–0.07)
Basophils Absolute: 0.1 10*3/uL (ref 0.0–0.1)
Basophils Relative: 1 %
Eosinophils Absolute: 0.1 10*3/uL (ref 0.0–0.5)
Eosinophils Relative: 1 %
HCT: 36.8 % (ref 36.0–46.0)
Hemoglobin: 12.1 g/dL (ref 12.0–15.0)
Immature Granulocytes: 0 %
Lymphocytes Relative: 20 %
Lymphs Abs: 1.6 10*3/uL (ref 0.7–4.0)
MCH: 30.2 pg (ref 26.0–34.0)
MCHC: 32.9 g/dL (ref 30.0–36.0)
MCV: 91.8 fL (ref 80.0–100.0)
Monocytes Absolute: 0.7 10*3/uL (ref 0.1–1.0)
Monocytes Relative: 9 %
Neutro Abs: 5.4 10*3/uL (ref 1.7–7.7)
Neutrophils Relative %: 69 %
Platelets: 294 10*3/uL (ref 150–400)
RBC: 4.01 MIL/uL (ref 3.87–5.11)
RDW: 12.4 % (ref 11.5–15.5)
WBC: 7.8 10*3/uL (ref 4.0–10.5)
nRBC: 0 % (ref 0.0–0.2)

## 2020-05-23 LAB — IRON AND TIBC
Iron: 70 ug/dL (ref 28–170)
Saturation Ratios: 19 % (ref 10.4–31.8)
TIBC: 370 ug/dL (ref 250–450)
UIBC: 300 ug/dL

## 2020-05-23 LAB — FERRITIN: Ferritin: 128 ng/mL (ref 11–307)

## 2020-05-25 ENCOUNTER — Other Ambulatory Visit: Payer: Self-pay

## 2020-05-25 ENCOUNTER — Inpatient Hospital Stay (HOSPITAL_BASED_OUTPATIENT_CLINIC_OR_DEPARTMENT_OTHER): Payer: Medicare Other | Admitting: Oncology

## 2020-05-25 DIAGNOSIS — E611 Iron deficiency: Secondary | ICD-10-CM

## 2020-05-25 DIAGNOSIS — D509 Iron deficiency anemia, unspecified: Secondary | ICD-10-CM | POA: Diagnosis not present

## 2020-05-25 DIAGNOSIS — Z85038 Personal history of other malignant neoplasm of large intestine: Secondary | ICD-10-CM | POA: Diagnosis not present

## 2020-05-25 DIAGNOSIS — D531 Other megaloblastic anemias, not elsewhere classified: Secondary | ICD-10-CM | POA: Diagnosis not present

## 2020-05-25 NOTE — Progress Notes (Signed)
Pt doing ok. She is tired but she knows that her iron numbers are good. She has reg BM to diarrhea and that is her normal. She is eating good and she has a dietician also.

## 2020-05-31 ENCOUNTER — Encounter: Payer: Self-pay | Admitting: Family Medicine

## 2020-05-31 NOTE — Progress Notes (Signed)
I connected with Autumn Zhang on 05/31/20 at  2:15 PM EDT by video enabled telemedicine visit and verified that I am speaking with the correct person using two identifiers.   I discussed the limitations, risks, security and privacy concerns of performing an evaluation and management service by telemedicine and the availability of in-person appointments. I also discussed with the patient that there may be a patient responsible charge related to this service. The patient expressed understanding and agreed to proceed.  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 She underwent right hemicolectomy which revealed poorly differentiated invasive adenocarcinoma T3N0. 0 out of 34 lymph nodes were positive. She received 12 cycles of adjuvant chemotherapy with 5-FU leucovorin. No oxaliplatin. 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 Her only brother has also had colon polyps. She has therefore been managed as a Lynch syndrome patient despite not having genetic features suggestive of Lynch syndrome. She took prophylactic aspirin for a while and then she stopped taking it. She has been getting colonoscopies every 1 to 2 years. She has also had capsule endoscopy in 2014 for iron deficiency anemia that was normal. She has not required any surveillance scans as she has been more than 5 years since her diagnosis of colon cancer. She has established care with Dr. Marius Ditch for this.  Other than that patient has a history of basal cell skin cancer x2 and is in the process of finding a dermatologist. She was also getting yearly pelvic exams  and pelvic ultrasound to screen for GYN cancers. Patient has a history of mast cell activation syndrome and often needs Benadryl before taking oral iron. Also reports that she has dysautonomia and pots for which she sees cardiology.Also has a h/o Ehler Danlos syndrome  Currently patient has an ongoing migraine attack. She is wearing dark glasses and a cat and unable to see eye to eye. She denies any blood in her stool or urine presently. Denies any dark melanotic stools. Her last IV iron was about 6 months ago and she does not remember what type of IV iron she has received.  She sees Dr. Esperanza Sheets from [facility] for mast cell activation syndrome   Interval history: Patient had symptoms of headaches from chronic migraine.  She has been feeling more fatigued than usual.   Review of Systems  Constitutional: Positive for malaise/fatigue. Negative for chills, fever and weight loss.  HENT: Negative for congestion, ear discharge and nosebleeds.   Eyes: Negative for blurred vision.  Respiratory: Negative for cough, hemoptysis, sputum production, shortness of breath and wheezing.   Cardiovascular: Negative for chest pain, palpitations, orthopnea and claudication.  Gastrointestinal: Negative for abdominal pain, blood in stool, constipation, diarrhea, heartburn, melena, nausea and vomiting.  Genitourinary: Negative for dysuria, flank pain, frequency, hematuria and urgency.  Musculoskeletal: Negative for back pain, joint pain and myalgias.  Skin: Negative for rash.  Neurological: Positive for headaches. Negative for dizziness, tingling, focal weakness, seizures and weakness.  Endo/Heme/Allergies: Does not bruise/bleed easily.  Psychiatric/Behavioral: Negative for depression and suicidal ideas. The patient does not have insomnia.     Allergies  Allergen Reactions  . Ioversol  Anxiety, Hives, Itching and Palpitations  . Ciprofloxacin Other (See Comments)  . Eggs Or Egg-Derived Products Other (See  Comments)    Exacerbates neuralgia symptoms Exacerbates neuralgia symptoms Congestion & Headaches Exacerbates neuralgia symptoms Congestion & Headaches   . Ginger Other (See Comments)    Congestion & Headaches Congestion & Headaches   . Levofloxacin Other (See Comments)    Congestion & Headaches Congestion & Headaches   . Other Other (See Comments)    Novocaine (IV) form Hearing loss Novocaine (IV) form Hearing loss  Congestion & Headaches Congestion & Headaches Congestion & Headaches Congestion & Headaches   . Soybean-Containing Drug Products Other (See Comments)    Congestion & Headaches   . Bee Pollen   . Bupropion   . Celebrex [Celecoxib]   . Codeine   . Dexilant [Dexlansoprazole]   . Dulera [Mometasone Furo-Formoterol Fum]   . Fentanyl   . Fluticasone   . Gabapentin   . Gluten Meal   . Iodine   . Lidocaine     Rash to topical cream  . Lyrica [Pregabalin]   . Mold Extract [Trichophyton]   . Morphine And Related   . Penicillins     Skin testing positive for allergy  . Pollen Extract   . Prilosec [Omeprazole]   . Rizatriptan Other (See Comments)    Dizziness, headache, myalgias, nausea  . Salmeterol Xinafoate   . Silenor [Doxepin Hcl]   . Stevioside Other (See Comments)  . Tomato   . Xylitol Other (See Comments)  . Ethanol Dermatitis, Other (See Comments), Itching and Palpitations  . Hydrocodone Nausea Only  . Ibuprofen Nausea Only    At high doses At high doses   . Lac Bovis Diarrhea    Facial burning sensation Facial burning sensation Facial burning sensation Facial burning sensation   . Lanolin Other (See Comments)    Exacerbates neuralgia symptoms Wool causes hives.   . Milk-Related Compounds Diarrhea    Facial burning sensation   . Wheat Bran Diarrhea    Past Medical History:  Diagnosis Date  . Allergy   . Anemia   . Anxiety   . Arthritis   . Asthma   . Basal cell carcinoma   . BRCA negative 09/2018   2015 [place] at [facility]; has  VUS; 1/20 MyRisk neg except RPS20 VUS; IBIS=10.8%/riskscore=12%  . Colon cancer (HCC)   . Dysautonomia (HCC)   . Ehlers-Danlos syndrome type III   . Family history of colon cancer   . GERD (gastroesophageal reflux disease)   . Hyperlipidemia   . Idiopathic small fiber peripheral neuropathy   . Leiomyoma   . Mast cell activation (HCC)   . Migraines   . Neck pain   . Occipital neuralgia   . Sleep apnea     Past Surgical History:  Procedure Laterality Date  . APPENDECTOMY     removed with colectomy  . BREAST SURGERY     reduction  . COLON SURGERY  2008   for colon cancer, ~1/3 of colon removed, primary reanastimosis  . COLONOSCOPY WITH PROPOFOL N/A 10/07/2018   Procedure: COLONOSCOPY WITH PROPOFOL;  Surgeon: Toney Reil, MD;  Location: [facility] ENDOSCOPY;  Service: Gastroenterology;  Laterality: N/A;  . ENDOMETRIAL ABLATION      Social History   Socioeconomic History  . Marital status: Married    Spouse name: Not on file  . Number of children: 0  . Years of education: Not on file  . Highest education level: Not on  file  Occupational History  . Occupation: SSI and disability  Tobacco Use  . Smoking status: Never Smoker  . Smokeless tobacco: Never Used  Vaping Use  . Vaping Use: Never used  Substance and Sexual Activity  . Alcohol use: Never  . Drug use: Never  . Sexual activity: Not Currently    Birth control/protection: None  Other Topics Concern  . Not on file  Social History Narrative  . Not on file   Social Determinants of Health   Financial Resource Strain:   . Difficulty of Paying Living Expenses: Not on file  Food Insecurity:   . Worried About Charity fundraiser in the Last Year: Not on file  . Ran Out of Food in the Last Year: Not on file  Transportation Needs:   . Lack of Transportation (Medical): Not on file  . Lack of Transportation (Non-Medical): Not on file  Physical Activity:   . Days of Exercise per Week: Not on file  . Minutes of  Exercise per Session: Not on file  Stress:   . Feeling of Stress : Not on file  Social Connections:   . Frequency of Communication with Friends and Family: Not on file  . Frequency of Social Gatherings with Friends and Family: Not on file  . Attends Religious Services: Not on file  . Active Member of Clubs or Organizations: Not on file  . Attends Archivist Meetings: Not on file  . Marital Status: Not on file  Intimate Partner Violence:   . Fear of Current or Ex-Partner: Not on file  . Emotionally Abused: Not on file  . Physically Abused: Not on file  . Sexually Abused: Not on file    Family History  Problem Relation Age of Onset  . Colon cancer Father 57  . Breast cancer Maternal Aunt 50       BRCA neg  . Endometrial cancer Maternal Aunt 50  . Colon cancer Maternal Grandmother 26  . Migraines Maternal Grandmother   . Tremor Maternal Grandmother   . Leukemia Maternal Grandfather 38  . Food Allergy Maternal Grandfather   . Migraines Mother   . Glaucoma Mother   . Cataracts Mother   . Tremor Mother   . Fibromyalgia Mother   . Heart disease Mother   . Colon polyps Brother        non-cancerous     Current Outpatient Medications:  .  acetaminophen (TYLENOL) 500 MG tablet, Take 500 mg by mouth every 6 (six) hours as needed., Disp: , Rfl:  .  albuterol (PROVENTIL HFA;VENTOLIN HFA) 108 (90 Base) MCG/ACT inhaler, Inhale 2 puffs into the lungs every 4 (four) hours as needed. , Disp: , Rfl:  .  Ascorbic Acid (VITAMIN C) 1000 MG tablet, Take 1,000 mg by mouth daily. probably total is 900 mg, Disp: , Rfl:  .  aspirin 325 MG tablet, Take by mouth., Disp: , Rfl:  .  augmented betamethasone dipropionate (DIPROLENE-AF) 0.05 % cream, APPLY TWICE DAILY AS NEEDED TO THE AFFECTED AREAS FOR 5-7 DAYS FOR ECZEMA., Disp: , Rfl:  .  b complex vitamins capsule, Take 1 capsule by mouth daily. 1/4 capsule a day, Disp: , Rfl:  .  BELSOMRA 5 MG TABS, Take 1 tablet by mouth at bedtime as  needed. , Disp: , Rfl:  .  Blood Pressure Monitoring (BLOOD PRESSURE CUFF) MISC, Take blood pressure as needed for symptoms., Disp: , Rfl:  .  Boswellia Serrata (BOSWELLIA PO), Take 150 mg  by mouth every morning. , Disp: , Rfl:  .  Calcium Acetate POWD, Take 500 mg by mouth daily., Disp: , Rfl:  .  cromolyn (GASTROCROM) 100 MG/5ML solution, Take by mouth 4 (four) times daily -  before meals and at bedtime., Disp: , Rfl:  .  cycloSPORINE (RESTASIS) 0.05 % ophthalmic emulsion, INSTILL 1 DROP INTO BOTH EYES TWICE A DAY, Disp: 180 mL, Rfl: 1 .  desloratadine (CLARINEX) 5 MG tablet, Take 5 mg by mouth 2 (two) times daily. An poss. 1 extra if needed, Disp: , Rfl:  .  diphenhydrAMINE (BENADRYL) 12.5 MG/5ML liquid, Take by mouth 4 (four) times daily as needed., Disp: , Rfl:  .  diphenhydrAMINE-Allantoin 2-0.5 % CREA, Apply 1 application topically as needed. , Disp: , Rfl:  .  docusate sodium (COLACE) 100 MG capsule, Take 100 mg by mouth every other day. , Disp: , Rfl:  .  econazole nitrate 1 % cream, APPLY TO FEET TWICE A DAY FOR 2 4 WEEKS AS NEEDED FOR FLARE, Disp: , Rfl:  .  EPINEPHrine 0.3 mg/0.3 mL IJ SOAJ injection, INJECT ONE AUTO-INJECTOR INTRAMUSCULARLY AS NEEDED, Disp: , Rfl:  .  estradiol (ESTRACE) 0.1 MG/GM vaginal cream, PLACE  ONE APPLICATORFUL   VAGINALLY TWICE A WEEK., Disp: 42.5 g, Rfl: 0 .  famotidine (PEPCID) 10 MG tablet, Take 10 mg by mouth daily. , Disp: , Rfl:  .  fexofenadine (ALLEGRA) 180 MG tablet, Take 180 mg by mouth daily as needed. , Disp: , Rfl:  .  guaifenesin (HUMIBID E) 400 MG TABS tablet, Take 400 mg by mouth daily. , Disp: , Rfl:  .  hydrOXYzine (ATARAX/VISTARIL) 50 MG tablet, Take 50 mg by mouth daily. , Disp: , Rfl:  .  ketotifen (ZADITOR) 0.025 % ophthalmic solution, Place 1 drop into both eyes daily as needed. , Disp: , Rfl:  .  Ketotifen Fumarate POWD, Take 1 mg by mouth in the morning and at bedtime. If needed 2 more time a day for total of 4, Disp: , Rfl:  .   L-Lysine 500 MG CAPS, Take by mouth 2 (two) times daily., Disp: , Rfl:  .  L-Theanine 100 MG CAPS, Take 200 mg by mouth daily. , Disp: , Rfl:  .  levocetirizine (XYZAL) 5 MG tablet, Take 1 tablet (5 mg total) by mouth 2 (two) times daily. (Patient taking differently: Take 5 mg by mouth daily. And some times 1 more extra), Disp: 180 tablet, Rfl: 3 .  Light Mineral Oil-Mineral Oil 0.5-0.5 % EMUL, Apply 1 application to eye daily. , Disp: , Rfl:  .  lipase/protease/amylase (CREON) 12000-38000 units CPEP capsule, Take 12,000 Units by mouth as directed., Disp: , Rfl:  .  loperamide (IMODIUM) 2 MG capsule, Take by mouth as needed for diarrhea or loose stools., Disp: , Rfl:  .  Magnesium Citrate POWD, Take 435 mg by mouth daily. , Disp: , Rfl:  .  Melatonin 1 MG/ML LIQD, Take by mouth daily. , Disp: , Rfl:  .  montelukast (SINGULAIR) 10 MG tablet, Take 10 mg by mouth daily. , Disp: , Rfl:  .  Nerve Stimulator (CEFALY KIT) DEVI, , Disp: , Rfl:  .  Non Gelatin Capsules, Empty, (CAPSULE #3 CLEAR/CLEAR VEG) CAPS, Take 1 capsule by mouth daily., Disp: , Rfl:  .  OTEZLA 30 MG TABS, Take 1 tablet by mouth 2 (two) times daily., Disp: , Rfl:  .  simethicone (MYLICON) 80 MG chewable tablet, Chew 80 mg by mouth every  6 (six) hours as needed for flatulence., Disp: , Rfl:  .  Sodium Fluoride (PREVIDENT 5000 BOOSTER) 1.1 % PSTE, Place onto teeth., Disp: , Rfl:  .  TRIAMCINOLONE ACETONIDE,NASAL, NA, Place into the nose as needed. , Disp: , Rfl:  .  UBRELVY 50 MG TABS, TAKE 1 TABLET BY MOUTH ONCE AS NEEDED FOR UP TO 1 DOSE, Disp: , Rfl:  .  Vitamin D, Cholecalciferol, 10 MCG (400 UNIT) TABS, Take by mouth daily. , Disp: , Rfl:  .  omega-3 acid ethyl esters (LOVAZA) 1 g capsule, 4 g. (Patient not taking: Reported on 05/25/2020), Disp: , Rfl:  .  polyethylene glycol (MIRALAX / GLYCOLAX) packet, Take 17 g by mouth daily as needed.  (Patient not taking: Reported on 05/25/2020), Disp: , Rfl:  .  pseudoephedrine (SUDAFED) 30 MG  tablet, Take 30 mg by mouth every 4 (four) hours as needed for congestion. (Patient not taking: Reported on 05/25/2020), Disp: , Rfl:   No results found.  No images are attached to the encounter.   CMP Latest Ref Rng & Units 01/04/2020  Glucose 65 - 99 mg/dL -  BUN 4 - 21 17  Creatinine 0.5 - 1.1 0.5  Sodium 137 - 147 145  Potassium 3.4 - 5.3 4.0  Chloride 99 - 108 107  CO2 13 - 22 35(A)  Calcium 8.7 - 10.7 9.0  Total Protein 6.4 - 8.2 7.5  Total Bilirubin 0.0 - 1.2 mg/dL -  Alkaline Phos 25 - 125 104  AST 13 - 35 19  ALT 7 - 35 28   CBC Latest Ref Rng & Units 05/23/2020  WBC 4.0 - 10.5 K/uL 7.8  Hemoglobin 12.0 - 15.0 g/dL 12.1  Hematocrit 36 - 46 % 36.8  Platelets 150 - 400 K/uL 294     Observation/objective: Appears in no acute distress over video visit today.  Breathing is nonlabored  Assessment and plan: Patient is a 49 year old female with history of iron deficiency anemia and this is a routine follow-up visit  Patient is presently not anemic and her iron studies are within normal limits.  She does not require any IV iron at this time.  Repeat CBC ferritin iron studies in 3 in 6 months and I will see her in 6 months.  B12 level also to be checked at 3 months.  Follow-up instructions: As above  I discussed the assessment and treatment plan with the patient. The patient was provided an opportunity to ask questions and all were answered. The patient agreed with the plan and demonstrated an understanding of the instructions.   The patient was advised to call back or seek an in-person evaluation if the symptoms worsen or if the condition fails to improve as anticipated.    Visit Diagnosis: 1. Iron deficiency anemia, unspecified iron deficiency anemia type   2. Iron deficiency   3. History of colon cancer   4. Vitamin B12 deficient megaloblastic anemia     Dr. Randa Evens, MD, MPH [place] [facility] Tel- [phone] 05/31/2020 9:57 AM

## 2020-06-08 ENCOUNTER — Other Ambulatory Visit: Payer: Self-pay

## 2020-06-08 ENCOUNTER — Encounter: Payer: Self-pay | Admitting: Family Medicine

## 2020-06-08 ENCOUNTER — Ambulatory Visit (INDEPENDENT_AMBULATORY_CARE_PROVIDER_SITE_OTHER): Payer: Medicare Other | Admitting: Family Medicine

## 2020-06-08 VITALS — BP 112/71 | HR 80 | Temp 98.5°F | Ht 61.0 in | Wt 140.0 lb

## 2020-06-08 DIAGNOSIS — Z23 Encounter for immunization: Secondary | ICD-10-CM

## 2020-06-08 DIAGNOSIS — Z0000 Encounter for general adult medical examination without abnormal findings: Secondary | ICD-10-CM

## 2020-06-08 NOTE — Patient Instructions (Addendum)
The CDC recommends two doses of Shingrix (the shingles vaccine) separated by 2 to 6 months for adults age 51 years and older. I recommend checking with your insurance plan regarding coverage for this vaccine.     Get 3rd dose of COVID vaccine   Preventive Care 64-25 Years Old, Female Preventive care refers to visits with your health care provider and lifestyle choices that can promote health and wellness. This includes:  A yearly physical exam. This may also be called an annual well check.  Regular dental visits and eye exams.  Immunizations.  Screening for certain conditions.  Healthy lifestyle choices, such as eating a healthy diet, getting regular exercise, not using drugs or products that contain nicotine and tobacco, and limiting alcohol use. What can I expect for my preventive care visit? Physical exam Your health care provider will check your:  Height and weight. This may be used to calculate body mass index (BMI), which tells if you are at a healthy weight.  Heart rate and blood pressure.  Skin for abnormal spots. Counseling Your health care provider may ask you questions about your:  Alcohol, tobacco, and drug use.  Emotional well-being.  Home and relationship well-being.  Sexual activity.  Eating habits.  Work and work Statistician.  Method of birth control.  Menstrual cycle.  Pregnancy history. What immunizations do I need?  Influenza (flu) vaccine  This is recommended every year. Tetanus, diphtheria, and pertussis (Tdap) vaccine  You may need a Td booster every 10 years. Varicella (chickenpox) vaccine  You may need this if you have not been vaccinated. Zoster (shingles) vaccine  You may need this after age 59. Measles, mumps, and rubella (MMR) vaccine  You may need at least one dose of MMR if you were born in 1957 or later. You may also need a second dose. Pneumococcal conjugate (PCV13) vaccine  You may need this if you have certain  conditions and were not previously vaccinated. Pneumococcal polysaccharide (PPSV23) vaccine  You may need one or two doses if you smoke cigarettes or if you have certain conditions. Meningococcal conjugate (MenACWY) vaccine  You may need this if you have certain conditions. Hepatitis A vaccine  You may need this if you have certain conditions or if you travel or work in places where you may be exposed to hepatitis A. Hepatitis B vaccine  You may need this if you have certain conditions or if you travel or work in places where you may be exposed to hepatitis B. Haemophilus influenzae type b (Hib) vaccine  You may need this if you have certain conditions. Human papillomavirus (HPV) vaccine  If recommended by your health care provider, you may need three doses over 6 months. You may receive vaccines as individual doses or as more than one vaccine together in one shot (combination vaccines). Talk with your health care provider about the risks and benefits of combination vaccines. What tests do I need? Blood tests  Lipid and cholesterol levels. These may be checked every 5 years, or more frequently if you are over 4 years old.  Hepatitis C test.  Hepatitis B test. Screening  Lung cancer screening. You may have this screening every year starting at age 42 if you have a 30-pack-year history of smoking and currently smoke or have quit within the past 15 years.  Colorectal cancer screening. All adults should have this screening starting at age 60 and continuing until age 69. Your health care provider may recommend screening at age 17  if you are at increased risk. You will have tests every 1-10 years, depending on your results and the type of screening test.  Diabetes screening. This is done by checking your blood sugar (glucose) after you have not eaten for a while (fasting). You may have this done every 1-3 years.  Mammogram. This may be done every 1-2 years. Talk with your health care  provider about when you should start having regular mammograms. This may depend on whether you have a family history of breast cancer.  BRCA-related cancer screening. This may be done if you have a family history of breast, ovarian, tubal, or peritoneal cancers.  Pelvic exam and Pap test. This may be done every 3 years starting at age 9. Starting at age 52, this may be done every 5 years if you have a Pap test in combination with an HPV test. Other tests  Sexually transmitted disease (STD) testing.  Bone density scan. This is done to screen for osteoporosis. You may have this scan if you are at high risk for osteoporosis. Follow these instructions at home: Eating and drinking  Eat a diet that includes fresh fruits and vegetables, whole grains, lean protein, and low-fat dairy.  Take vitamin and mineral supplements as recommended by your health care provider.  Do not drink alcohol if: ? Your health care provider tells you not to drink. ? You are pregnant, may be pregnant, or are planning to become pregnant.  If you drink alcohol: ? Limit how much you have to 0-1 drink a day. ? Be aware of how much alcohol is in your drink. In the [place] one drink equals one 12 oz bottle of beer (355 mL), one 5 oz glass of wine (148 mL), or one 1 oz glass of hard liquor (44 mL). Lifestyle  Take daily care of your teeth and gums.  Stay active. Exercise for at least 30 minutes on 5 or more days each week.  Do not use any products that contain nicotine or tobacco, such as cigarettes, e-cigarettes, and chewing tobacco. If you need help quitting, ask your health care provider.  If you are sexually active, practice safe sex. Use a condom or other form of birth control (contraception) in order to prevent pregnancy and STIs (sexually transmitted infections).  If told by your health care provider, take low-dose aspirin daily starting at age 18. What's next?  Visit your health care provider once a year for a  well check visit.  Ask your health care provider how often you should have your eyes and teeth checked.  Stay up to date on all vaccines. This information is not intended to replace advice given to you by your health care provider. Make sure you discuss any questions you have with your health care provider. Document Revised: 05/13/2018 Document Reviewed: 05/13/2018 Elsevier Patient Education  2020 [employer].

## 2020-06-08 NOTE — Progress Notes (Signed)
Welcome to Medicare   Patient: Autumn Zhang   DOB: 1970/12/15   49 y.o. Female  MRN: [id number] Visit Date: 06/08/2020  Today's healthcare provider: [name], MD   Chief Complaint  Patient presents with  . Annual Exam   Subjective    Autumn Zhang is a 49 y.o. female who presents today for a Welcome to Medicare.    Past Medical History:  Diagnosis Date  . Allergy   . Anemia   . Anxiety   . Arthritis   . Asthma   . Basal cell carcinoma   . BRCA negative 09/2018   2015 [place] at [facility]; has VUS; 1/20 MyRisk neg except RPS20 VUS; IBIS=10.8%/riskscore=12%  . Colon cancer ([place])   . Dysautonomia ([place])   . Ehlers-Danlos syndrome type III   . Family history of colon cancer   . GERD (gastroesophageal reflux disease)   . Hyperlipidemia   . Idiopathic small fiber peripheral neuropathy   . Leiomyoma   . 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 Migraines   . Neck pain   . Occipital neuralgia   . Sleep apnea    Past Surgical History:  Procedure Laterality Date  . APPENDECTOMY     removed with colectomy  . BREAST SURGERY     reduction  . COLON SURGERY  2008   for colon cancer, ~1/3 of colon removed, primary reanastimosis  . COLONOSCOPY WITH PROPOFOL N/A 10/07/2018   Procedure: COLONOSCOPY WITH PROPOFOL;  Surgeon: Lin Landsman, MD;  Location: [facility] ENDOSCOPY;  Service: Gastroenterology;  Laterality: N/A;  . ENDOMETRIAL ABLATION     Social History   Socioeconomic History  . Marital status: Married    Spouse name: Not on file  . Number of children: 0  . Years of education: Not on file  . Highest education level: Not on file  Occupational History  . Occupation: SSI and disability  Tobacco Use  . Smoking status: Never Smoker  . Smokeless tobacco: Never Used  Vaping Use  . Vaping Use: Never used  Substance and Sexual Activity  . Alcohol use: Never  . Drug use: Never  . Sexual activity: Not Currently    Birth control/protection: None  Other  Topics Concern  . Not on file  Social History Narrative  . Not on file   Social Determinants of Health   Financial Resource Strain:   . Difficulty of Paying Living Expenses: Not on file  Food Insecurity:   . Worried About Charity fundraiser in the Last Year: Not on file  . Ran Out of Food in the Last Year: Not on file  Transportation Needs:   . Lack of Transportation (Medical): Not on file  . Lack of Transportation (Non-Medical): Not on file  Physical Activity:   . Days of Exercise per Week: Not on file  . Minutes of Exercise per Session: Not on file  Stress:   . Feeling of Stress : Not on file  Social Connections:   . Frequency of Communication with Friends and Family: Not on file  . Frequency of Social Gatherings with Friends and Family: Not on file  . Attends Religious Services: Not on file  . Active Member of Clubs or Organizations: Not on file  . Attends Archivist Meetings: Not on file  . Marital Status: Not on file  Intimate Partner Violence:   . Fear of Current or Ex-Partner: Not on file  . Emotionally Abused: Not on file  .  Physically Abused: Not on file  . Sexually Abused: Not on file   Family Status  Relation Name Status  . Father  Deceased  . Mat Aunt  Deceased  . MGM  Deceased  . MGF  Deceased  . Mother  Alive  . Brother  (Not Specified)   Family History  Problem Relation Age of Onset  . Colon cancer Father 71  . Breast cancer Maternal Aunt 50       BRCA neg  . Endometrial cancer Maternal Aunt 50  . Colon cancer Maternal Grandmother 88  . Migraines Maternal Grandmother   . Tremor Maternal Grandmother   . Leukemia Maternal Grandfather 47  . Food Allergy Maternal Grandfather   . Migraines Mother   . Glaucoma Mother   . Cataracts Mother   . Tremor Mother   . Fibromyalgia Mother   . Heart disease Mother   . Colon polyps Brother        non-cancerous   Allergies  Allergen Reactions  . Ioversol Anxiety, Hives, Itching and Palpitations   . Ciprofloxacin Other (See Comments)  . Eggs Or Egg-Derived Products Other (See Comments)    Exacerbates neuralgia symptoms Exacerbates neuralgia symptoms Congestion & Headaches Exacerbates neuralgia symptoms Congestion & Headaches   . Ginger Other (See Comments)    Congestion & Headaches Congestion & Headaches   . Levofloxacin Other (See Comments)    Congestion & Headaches Congestion & Headaches   . Other Other (See Comments)    Novocaine (IV) form Hearing loss Novocaine (IV) form Hearing loss  Congestion & Headaches Congestion & Headaches Congestion & Headaches Congestion & Headaches   . Soybean-Containing Drug Products Other (See Comments)    Congestion & Headaches   . Bee Pollen   . Bupropion   . Celebrex [Celecoxib]   . Codeine   . Dexilant [Dexlansoprazole]   . Dulera [Mometasone Furo-Formoterol Fum]   . Fentanyl   . Fluticasone   . Gabapentin   . Gluten Meal   . Iodine   . Lidocaine     Rash to topical cream  . Lyrica [Pregabalin]   . Mold Extract [Trichophyton]   . Morphine And Related   . Penicillins     Skin testing positive for allergy  . Pollen Extract   . Prilosec [Omeprazole]   . Rizatriptan Other (See Comments)    Dizziness, headache, myalgias, nausea  . Salmeterol Xinafoate   . Silenor [Doxepin Hcl]   . Stevioside Other (See Comments)  . Tomato   . Xylitol Other (See Comments)  . Ethanol Dermatitis, Other (See Comments), Itching and Palpitations  . Hydrocodone Nausea Only  . Ibuprofen Nausea Only    At high doses At high doses   . Lac [place]ov[place] Diarrhea    Facial burning sensation Facial burning sensation Facial burning sensation Facial burning sensation   . Lanolin Other (See Comments)    Exacerbates neuralgia symptoms Wool causes hives.   . Milk-Related Compounds Diarrhea    Facial burning sensation   . Wheat Bran Diarrhea    Patient Care Team: Virginia Crews, MD as PCP - General (Family Medicine) Clent Jacks,  RN as Registered Nurse   Medications: Outpatient Medications Prior to Visit  Medication Sig  . acetaminophen (TYLENOL) 500 MG tablet Take 500 mg by mouth every 6 (six) hours as needed.  Marland Kitchen albuterol (PROVENTIL HFA;VENTOLIN HFA) 108 (90 Base) MCG/ACT inhaler Inhale 2 puffs into the lungs every 4 (four) hours as needed.   . Ascorbic  Acid (VITAMIN C) 1000 MG tablet Take 1,000 mg by mouth daily. probably total is 900 mg  . aspirin 325 MG tablet Take by mouth.  Marland Kitchen augmented betamethasone dipropionate (DIPROLENE-AF) 0.05 % cream APPLY TWICE DAILY AS NEEDED TO THE AFFECTED AREAS FOR 5-7 DAYS FOR ECZEMA.  Marland Kitchen b complex vitamins capsule Take 1 capsule by mouth daily. 1/4 capsule a day  . BELSOMRA 5 MG TABS Take 1 tablet by mouth at bedtime as needed.   . Blood Pressure Monitoring (BLOOD PRESSURE CUFF) MISC Take blood pressure as needed for symptoms.  Azucena Freed Serrata (BOSWELLIA PO) Take 150 mg by mouth every morning.   . Calcium Citrate 200 MG TABS Take 600 mg by mouth.  . cromolyn (GASTROCROM) 100 MG/5ML solution Take by mouth 4 (four) times daily -  before meals and at bedtime.  . cycloSPORINE (RESTASIS) 0.05 % ophthalmic emulsion INSTILL 1 DROP INTO BOTH EYES TWICE A DAY  . desloratadine (CLARINEX) 5 MG tablet Take 5 mg by mouth 2 (two) times daily. An poss. 1 extra if needed  . diphenhydrAMINE (BENADRYL) 12.5 MG/5ML liquid Take by mouth 4 (four) times daily as needed.  . diphenhydrAMINE-Allantoin 2-0.5 % CREA Apply 1 application topically as needed.   . docusate sodium (COLACE) 100 MG capsule Take 100 mg by mouth every other day.   Marland Kitchen econazole nitrate 1 % cream APPLY TO FEET TWICE A DAY FOR 2 4 WEEKS AS NEEDED FOR FLARE  . EPINEPHrine 0.3 mg/0.3 mL IJ SOAJ injection INJECT ONE AUTO-INJECTOR INTRAMUSCULARLY AS NEEDED  . estradiol (ESTRACE) 0.1 MG/GM vaginal cream PLACE  ONE APPLICATORFUL   VAGINALLY TWICE A WEEK.  . famotidine (PEPCID) 10 MG tablet Take 10 mg by mouth daily.   . fexofenadine  (ALLEGRA) 180 MG tablet Take 180 mg by mouth daily as needed.   Marland Kitchen guaifenesin (HUMIBID E) 400 MG TABS tablet Take 400 mg by mouth daily.   . hydrOXYzine (ATARAX/VISTARIL) 50 MG tablet Take 50 mg by mouth daily.   Marland Kitchen ketotifen (ZADITOR) 0.025 % ophthalmic solution Place 1 drop into both eyes daily as needed.   Marland Kitchen Ketotifen Fumarate POWD Take 1 mg by mouth in the morning and at bedtime. If needed 2 more time a day for total of 4  . L-Lysine 500 MG CAPS Take by mouth 2 (two) times daily.  Marland Kitchen L-Theanine 100 MG CAPS Take 200 mg by mouth daily.   Marland Kitchen levocetirizine (XYZAL) 5 MG tablet Take 1 tablet (5 mg total) by mouth 2 (two) times daily. (Patient taking differently: Take 5 mg by mouth daily. And some times 1 more extra)  . Light Mineral Oil-Mineral Oil 0.5-0.5 % EMUL Apply 1 application to eye daily.   . lipase/protease/amylase (CREON) 12000-38000 units CPEP capsule Take 12,000 Units by mouth as directed.  . loperamide (IMODIUM) 2 MG capsule Take by mouth as needed for diarrhea or loose stools.  . Magnesium Citrate POWD Take 435 mg by mouth daily.   . Melatonin 1 MG/ML LIQD Take by mouth daily.   . montelukast (SINGULAIR) 10 MG tablet Take 10 mg by mouth daily.   . Nerve Stimulator (CEFALY KIT) DEVI   . Non Gelatin Capsules, Empty, (CAPSULE #3 CLEAR/CLEAR VEG) CAPS Take 1 capsule by mouth daily.  Marland Kitchen omega-3 acid ethyl esters (LOVAZA) 1 g capsule 4 g.   . OTEZLA 30 MG TABS Take 1 tablet by mouth 2 (two) times daily.  . polyethylene glycol (MIRALAX / GLYCOLAX) packet Take 17 g by mouth  daily as needed.   . pseudoephedrine (SUDAFED) 30 MG tablet Take 30 mg by mouth every 4 (four) hours as needed for congestion.   . simethicone (MYLICON) 80 MG chewable tablet Chew 80 mg by mouth every 6 (six) hours as needed for flatulence.  . Sodium Fluoride (PREVIDENT 5000 BOOSTER) 1.1 % PSTE Place onto teeth.  . TRIAMCINOLONE ACETONIDE,NASAL, NA Place into the nose as needed.   . UBRELVY 50 MG TABS TAKE 1 TABLET BY  MOUTH ONCE AS NEEDED FOR UP TO 1 DOSE  . Vitamin D, Cholecalciferol, 10 MCG (400 UNIT) TABS Take by mouth daily.   . Calcium Acetate POWD Take 500 mg by mouth daily.   No facility-administered medications prior to visit.    Review of Systems  Constitutional: Positive for fatigue.  HENT: Positive for congestion, postnasal drip, sinus pressure, sore throat, tinnitus and trouble swallowing.   Eyes: Positive for photophobia, pain, redness, itching and visual disturbance.  Respiratory: Positive for apnea, chest tightness and shortness of breath.   Cardiovascular: Positive for chest pain, palpitations and leg swelling (feet).  Gastrointestinal: Positive for abdominal distention, abdominal pain, constipation, diarrhea, nausea and rectal pain.  Endocrine: Negative.   Genitourinary: Positive for difficulty urinating, enuresis, frequency, urgency and vaginal pain.  Musculoskeletal: Positive for arthralgias, back pain, joint swelling, myalgias, neck pain and neck stiffness.  Skin: Positive for rash and wound.  Allergic/Immunologic: Positive for environmental allergies, food allergies and immunocompromised state.  Neurological: Positive for dizziness, tremors, facial asymmetry, weakness, light-headedness and headaches.  Hematological: Bruises/bleeds easily.  Psychiatric/Behavioral: Positive for decreased concentration and sleep disturbance.      Objective    BP 112/71 (BP Location: Left Arm, Patient Position: Sitting, Cuff Size: Normal)   Pulse 80   Temp 98.5 F (36.9 C) (Oral)   Ht _0  (1.549 m)   Wt 140 lb (63.5 kg)   SpO2 100%   BMI 26.45 kg/m    Physical Exam Vitals reviewed.  Constitutional:      General: She is not in acute distress.    Appearance: Normal appearance. She is well-developed. She is not diaphoretic.  HENT:     Head: Normocephalic and atraumatic.     Right Ear: Tympanic membrane, ear canal and external ear normal.     Left Ear: Tympanic membrane, ear canal and  external ear normal.  Eyes:     General: No scleral icterus.    Conjunctiva/sclera: Conjunctivae normal.     Pupils: Pupils are equal, round, and reactive to light.  Neck:     Thyroid: No thyromegaly.  Cardiovascular:     Rate and Rhythm: Normal rate and regular rhythm.     Pulses: Normal pulses.     Heart sounds: Normal heart sounds. No murmur heard.   Pulmonary:     Effort: Pulmonary effort is normal. No respiratory distress.     Breath sounds: Normal breath sounds. No wheezing or rales.  Chest:     Comments: Breasts: breasts appear normal, no suspicious masses, no skin or nipple changes or axillary nodes.  Abdominal:     General: There is no distension.     Palpations: Abdomen is soft.     Tenderness: There is no abdominal tenderness.  Musculoskeletal:        General: No deformity.     Cervical back: Neck supple.     Right lower leg: No edema.     Left lower leg: No edema.  Lymphadenopathy:     Cervical: No cervical  adenopathy.  Skin:    General: Skin is warm and dry.     Findings: No rash.  Neurological:     Mental Status: She is alert and oriented to person, place, and time. Mental status is at baseline.     Sensory: No sensory deficit.     Motor: No weakness.     Gait: Gait normal.  Psychiatric:        Mood and Affect: Mood normal.        Behavior: Behavior normal.        Thought Content: Thought content normal.     EKG: NSR   Last depression screening scores PHQ 2/9 Scores 06/08/2020 11/22/2019 10/05/2018  PHQ - 2 Score 0 0 1  PHQ- 9 Score _0 Last fall risk screening Fall Risk  06/08/2020  Falls in the past year? 1  Number falls in past yr: 1  Injury with Fall? 1   Last Audit-C alcohol use screening Alcohol Use Disorder Test (AUDIT) 06/08/2020  1. How often do you have a drink containing alcohol? 0  2. How many drinks containing alcohol do you have on a typical day when you are drinking? 0  3. How often do you have six or more drinks on one occasion?  0  AUDIT-C Score 0  Alcohol Brief Interventions/Follow-up AUDIT Score <7 follow-up not indicated   A score of 3 or more in women, and 4 or more in men indicates increased risk for alcohol abuse, EXCEPT if all of the points are from question 1   No results found for any visits on 06/08/20.  Assessment & Plan    Routine Health Maintenance and Physical Exam  Exercise Activities and Dietary recommendations Goals   None     Immunization History  Administered Date(s) Administered  . Influenza Inj Mdck Quad Pf 05/24/2018  . Influenza, High Dose Seasonal PF 05/24/2018  . Influenza,inj,Quad PF,6+ Mos 05/29/2016, 06/18/2017, 05/20/2019, 06/08/2020  . Influenza,inj,quad, With Preservative 05/24/2018  . Influenza-Unspecified 05/24/2018, 05/20/2019  . Pneumococcal Conjugate-13 04/06/2015, 11/08/2017  . Tdap 07/17/2005, 10/29/2015    Health Maintenance  Topic Date Due  . Hepatitis C Screening  Never done  . COVID-19 Vaccine (1) Never done  . HIV Screening  Never done  . COLONOSCOPY  10/07/2020  . MAMMOGRAM  04/04/2022  . PAP SMEAR-Modifier  09/08/2023  . TETANUS/TDAP  10/28/2025  . INFLUENZA VACCINE  Completed    Discussed health benefits of physical activity, and encouraged her to engage in regular exercise appropriate for her age and condition.  Problem List Items Addressed This Visit    None    Visit Diagnoses    Welcome to Medicare preventive visit    -  Primary   Relevant Orders   EKG 12-Lead (Completed)   Need for influenza vaccination       Relevant Orders   Flu Vaccine QUAD 6+ mos PF IM (Fluarix Quad PF) (Completed)       Return in about 1 week (around 06/15/2020) for chronic disease f/u, as scheduled.     I, [name], MD, have reviewed all documentation for this visit. The documentation on 06/08/20 for the exam, diagnosis, procedures, and orders are all accurate and complete.   Blyss Lugar, Dionne Bucy, MD, MPH [facility]

## 2020-06-12 ENCOUNTER — Other Ambulatory Visit: Payer: Self-pay

## 2020-06-12 ENCOUNTER — Encounter: Payer: Self-pay | Admitting: Family Medicine

## 2020-06-12 ENCOUNTER — Ambulatory Visit (INDEPENDENT_AMBULATORY_CARE_PROVIDER_SITE_OTHER): Payer: Medicare Other | Admitting: Family Medicine

## 2020-06-12 VITALS — BP 108/59 | HR 57 | Temp 98.6°F | Resp 16 | Wt 141.0 lb

## 2020-06-12 DIAGNOSIS — M2550 Pain in unspecified joint: Secondary | ICD-10-CM | POA: Insufficient documentation

## 2020-06-12 DIAGNOSIS — R739 Hyperglycemia, unspecified: Secondary | ICD-10-CM | POA: Diagnosis not present

## 2020-06-12 DIAGNOSIS — R5382 Chronic fatigue, unspecified: Secondary | ICD-10-CM | POA: Diagnosis not present

## 2020-06-12 DIAGNOSIS — R350 Frequency of micturition: Secondary | ICD-10-CM | POA: Diagnosis not present

## 2020-06-12 DIAGNOSIS — M8589 Other specified disorders of bone density and structure, multiple sites: Secondary | ICD-10-CM

## 2020-06-12 DIAGNOSIS — E7800 Pure hypercholesterolemia, unspecified: Secondary | ICD-10-CM | POA: Diagnosis not present

## 2020-06-12 DIAGNOSIS — G4733 Obstructive sleep apnea (adult) (pediatric): Secondary | ICD-10-CM | POA: Insufficient documentation

## 2020-06-12 DIAGNOSIS — G901 Familial dysautonomia [Riley-Day]: Secondary | ICD-10-CM

## 2020-06-12 DIAGNOSIS — Q796 Ehlers-Danlos syndrome, unspecified: Secondary | ICD-10-CM

## 2020-06-12 DIAGNOSIS — E559 Vitamin D deficiency, unspecified: Secondary | ICD-10-CM

## 2020-06-12 DIAGNOSIS — G90A Postural orthostatic tachycardia syndrome (POTS): Secondary | ICD-10-CM

## 2020-06-12 DIAGNOSIS — G894 Chronic pain syndrome: Secondary | ICD-10-CM | POA: Insufficient documentation

## 2020-06-12 DIAGNOSIS — N898 Other specified noninflammatory disorders of vagina: Secondary | ICD-10-CM

## 2020-06-12 DIAGNOSIS — I498 Other specified cardiac arrhythmias: Secondary | ICD-10-CM

## 2020-06-12 DIAGNOSIS — E618 Deficiency of other specified nutrient elements: Secondary | ICD-10-CM

## 2020-06-12 DIAGNOSIS — R202 Paresthesia of skin: Secondary | ICD-10-CM

## 2020-06-12 DIAGNOSIS — R400 Somnolence: Secondary | ICD-10-CM

## 2020-06-12 DIAGNOSIS — M5481 Occipital neuralgia: Secondary | ICD-10-CM

## 2020-06-12 DIAGNOSIS — Z1159 Encounter for screening for other viral diseases: Secondary | ICD-10-CM

## 2020-06-12 DIAGNOSIS — L4050 Arthropathic psoriasis, unspecified: Secondary | ICD-10-CM

## 2020-06-12 DIAGNOSIS — D8940 Mast cell activation, unspecified: Secondary | ICD-10-CM

## 2020-06-12 DIAGNOSIS — Z114 Encounter for screening for human immunodeficiency virus [HIV]: Secondary | ICD-10-CM

## 2020-06-12 DIAGNOSIS — Z9989 Dependence on other enabling machines and devices: Secondary | ICD-10-CM

## 2020-06-12 LAB — POCT URINALYSIS DIPSTICK
Bilirubin, UA: NEGATIVE
Blood, UA: NEGATIVE
Glucose, UA: NEGATIVE
Ketones, UA: NEGATIVE
Leukocytes, UA: NEGATIVE
Nitrite, UA: NEGATIVE
Protein, UA: NEGATIVE
Spec Grav, UA: <=1.005 — AB (ref 1.010–1.025)
Urobilinogen, UA: 0.2 E.U./dL
pH, UA: 6 (ref 5.0–8.0)

## 2020-06-12 MED ORDER — OMEGA-3-ACID ETHYL ESTERS 1 G PO CAPS
2.0000 g | ORAL_CAPSULE | Freq: Two times a day (BID) | ORAL | 3 refills | Status: DC
Start: 2020-06-12 — End: 2020-08-02

## 2020-06-12 NOTE — Progress Notes (Addendum)
Established patient visit   Patient: Autumn Zhang   DOB: [birth year]   49 y.o. Female  MRN: [id number] Visit Date: 06/12/2020  Today's healthcare provider: [name], MD   Chief Complaint  Patient presents with  . Fatigue   Subjective    HPI  Patient comes in office today to address concern of fatigue that she reports that she has been suffering with for over 5 years. Patient states that she finds her self falling asleep during the day, patient reports on average she sleeps 7-8hrs a night and states that she wakes up more than two times in a night. Patient reports there are times that she has had incidents where she has been "light sleeping" and is notified by her fitbit that she is asleep. Patient reports that she has had a sleep study done in the past year and was informed that she has sleep apnea and restless leg syndrome. Patient states that she sees a "sleep doctor" at [facility], and uses a cpap machine throughout the night.  Social History   Tobacco Use  . Smoking status: Never Smoker  . Smokeless tobacco: Never Used  Vaping Use  . Vaping Use: Never used  Substance Use Topics  . Alcohol use: Never  . Drug use: Never       Medications: Outpatient Medications Prior to Visit  Medication Sig  . acetaminophen (TYLENOL) 500 MG tablet Take 500 mg by mouth every 6 (six) hours as needed.  Marland Kitchen albuterol (PROVENTIL HFA;VENTOLIN HFA) 108 (90 Base) MCG/ACT inhaler Inhale 2 puffs into the lungs every 4 (four) hours as needed.   . Ascorbic Acid (VITAMIN C) 1000 MG tablet Take 1,000 mg by mouth daily. probably total is 900 mg  . aspirin 325 MG tablet Take by mouth.  Marland Kitchen augmented betamethasone dipropionate (DIPROLENE-AF) 0.05 % cream APPLY TWICE DAILY AS NEEDED TO THE AFFECTED AREAS FOR 5-7 DAYS FOR ECZEMA.  Marland Kitchen b complex vitamins capsule Take 1 capsule by mouth daily. 1/4 capsule a day  . BELSOMRA 5 MG TABS Take 1 tablet by mouth at bedtime as needed.   . Blood  Pressure Monitoring (BLOOD PRESSURE CUFF) MISC Take blood pressure as needed for symptoms.  Azucena Freed Serrata (BOSWELLIA PO) Take 150 mg by mouth every morning.   . Calcium Citrate 200 MG TABS Take 600 mg by mouth.  . cromolyn (GASTROCROM) 100 MG/5ML solution Take by mouth 4 (four) times daily -  before meals and at bedtime.  . cycloSPORINE (RESTASIS) 0.05 % ophthalmic emulsion INSTILL 1 DROP INTO BOTH EYES TWICE A DAY  . desloratadine (CLARINEX) 5 MG tablet Take 5 mg by mouth 2 (two) times daily. An poss. 1 extra if needed  . diphenhydrAMINE (BENADRYL) 12.5 MG/5ML liquid Take by mouth 4 (four) times daily as needed.  . diphenhydrAMINE-Allantoin 2-0.5 % CREA Apply 1 application topically as needed.   . docusate sodium (COLACE) 100 MG capsule Take 100 mg by mouth every other day.   Marland Kitchen econazole nitrate 1 % cream APPLY TO FEET TWICE A DAY FOR 2 4 WEEKS AS NEEDED FOR FLARE  . EPINEPHrine 0.3 mg/0.3 mL IJ SOAJ injection INJECT ONE AUTO-INJECTOR INTRAMUSCULARLY AS NEEDED  . estradiol (ESTRACE) 0.1 MG/GM vaginal cream PLACE  ONE APPLICATORFUL   VAGINALLY TWICE A WEEK.  . famotidine (PEPCID) 10 MG tablet Take 10 mg by mouth daily.   . fexofenadine (ALLEGRA) 180 MG tablet Take 180 mg by mouth daily as needed.   Marland Kitchen guaifenesin (  HUMIBID E) 400 MG TABS tablet Take 400 mg by mouth daily.   . hydrOXYzine (ATARAX/VISTARIL) 50 MG tablet Take 50 mg by mouth daily.   Marland Kitchen ketotifen (ZADITOR) 0.025 % ophthalmic solution Place 1 drop into both eyes daily as needed.   Marland Kitchen Ketotifen Fumarate POWD Take 1 mg by mouth in the morning and at bedtime. If needed 2 more time a day for total of 4  . L-Lysine 500 MG CAPS Take by mouth 2 (two) times daily.  Marland Kitchen L-Theanine 100 MG CAPS Take 200 mg by mouth daily.   Marland Kitchen levocetirizine (XYZAL) 5 MG tablet Take 1 tablet (5 mg total) by mouth 2 (two) times daily. (Patient taking differently: Take 5 mg by mouth daily. And some times 1 more extra)  . Light Mineral Oil-Mineral Oil 0.5-0.5 %  EMUL Apply 1 application to eye daily.   . lipase/protease/amylase (CREON) 12000-38000 units CPEP capsule Take 12,000 Units by mouth as directed.  . loperamide (IMODIUM) 2 MG capsule Take by mouth as needed for diarrhea or loose stools.  . Magnesium Citrate POWD Take 435 mg by mouth daily.   . Melatonin 1 MG/ML LIQD Take by mouth daily.   . montelukast (SINGULAIR) 10 MG tablet Take 10 mg by mouth daily.   . Nerve Stimulator (CEFALY KIT) DEVI   . Non Gelatin Capsules, Empty, (CAPSULE #3 CLEAR/CLEAR VEG) CAPS Take 1 capsule by mouth daily.  Marland Kitchen OTEZLA 30 MG TABS Take 1 tablet by mouth 2 (two) times daily.  . polyethylene glycol (MIRALAX / GLYCOLAX) packet Take 17 g by mouth daily as needed.   . pseudoephedrine (SUDAFED) 30 MG tablet Take 30 mg by mouth every 4 (four) hours as needed for congestion.   . Riboflavin (VITAMIN B-2 PO) Take by mouth.  . simethicone (MYLICON) 80 MG chewable tablet Chew 80 mg by mouth every 6 (six) hours as needed for flatulence.  . Sodium Fluoride (PREVIDENT 5000 BOOSTER) 1.1 % PSTE Place onto teeth.  . TRIAMCINOLONE ACETONIDE,NASAL, NA Place into the nose as needed.   . UBRELVY 50 MG TABS TAKE 1 TABLET BY MOUTH ONCE AS NEEDED FOR UP TO 1 DOSE  . Vitamin D, Cholecalciferol, 10 MCG (400 UNIT) TABS Take by mouth daily.   . [DISCONTINUED] omega-3 acid ethyl esters (LOVAZA) 1 g capsule 4 g.    No facility-administered medications prior to visit.    Review of Systems  Constitutional: Negative.   Respiratory: Negative.   Cardiovascular: Negative.   Musculoskeletal: Negative.   Neurological: Negative.   Psychiatric/Behavioral: Negative.        Objective    BP (!) 108/59   Pulse (!) 57   Temp 98.6 F (37 C) (Oral)   Resp 16   Wt 141 lb (64 kg)   SpO2 99%   BMI 26.64 kg/m  BP Readings from Last 3 Encounters:  06/12/20 (!) 108/59  06/08/20 112/71  11/22/19 113/74   Wt Readings from Last 3 Encounters:  06/12/20 141 lb (64 kg)  06/08/20 140 lb (63.5 kg)    11/22/19 134 lb (60.8 kg)      Physical Exam Vitals reviewed.  Constitutional:      General: She is not in acute distress.    Appearance: Normal appearance. She is well-developed. She is not diaphoretic.  HENT:     Head: Normocephalic and atraumatic.  Eyes:     General: No scleral icterus.    Conjunctiva/sclera: Conjunctivae normal.  Neck:     Thyroid: No thyromegaly.  Cardiovascular:     Rate and Rhythm: Normal rate and regular rhythm.  Pulmonary:     Effort: Pulmonary effort is normal. No respiratory distress.  Musculoskeletal:     Cervical back: Neck supple.     Right lower leg: No edema.     Left lower leg: No edema.  Lymphadenopathy:     Cervical: No cervical adenopathy.  Skin:    General: Skin is warm and dry.     Findings: No rash.  Neurological:     Mental Status: She is alert and oriented to person, place, and time. Mental status is at baseline.  Psychiatric:        Mood and Affect: Mood normal.        Behavior: Behavior normal.       Results for orders placed or performed in visit on 06/12/20  Iodine, Serum/Plasma  Result Value Ref Range   Iodine WILL FOLLOW   Hemoglobin A1c  Result Value Ref Range   Hgb A1c MFr Bld 5.1 4.8 - 5.6 %   Est. average glucose Bld gHb Est-mCnc 100 mg/dL  TSH + free T4  Result Value Ref Range   TSH 1.600 0.450 - 4.500 uIU/mL   Free T4 1.13 0.82 - 1.77 ng/dL  Lipid panel  Result Value Ref Range   Cholesterol, Total 208 (H) 100 - 199 mg/dL   Triglycerides 111 0 - 149 mg/dL   HDL 80 >39 mg/dL   VLDL Cholesterol Cal 19 5 - 40 mg/dL   LDL Chol Calc (NIH) 109 (H) 0 - 99 mg/dL   Chol/HDL Ratio 2.6 0.0 - 4.4 ratio  Comprehensive metabolic panel  Result Value Ref Range   Glucose 90 65 - 99 mg/dL   BUN 15 6 - 24 mg/dL   Creatinine, Ser 0.64 0.57 - 1.00 mg/dL   GFR calc non Af Amer 104 >59 mL/min/1.73   GFR calc Af Amer 119 >59 mL/min/1.73   BUN/Creatinine Ratio 23 9 - 23   Sodium 141 134 - 144 mmol/L   Potassium 4.4 3.5 -  5.2 mmol/L   Chloride 100 96 - 106 mmol/L   CO2 24 20 - 29 mmol/L   Calcium 9.7 8.7 - 10.2 mg/dL   Total Protein 7.2 6.0 - 8.5 g/dL   Albumin 4.8 3.8 - 4.9 g/dL   Globulin, Total 2.4 1.5 - 4.5 g/dL   Albumin/Globulin Ratio 2.0 1.2 - 2.2   Bilirubin Total 0.3 0.0 - 1.2 mg/dL   Alkaline Phosphatase 109 44 - 121 IU/L   AST 26 0 - 40 IU/L   ALT 19 0 - 32 IU/L  VITAMIN D 25 Hydroxy (Vit-D Deficiency, Fractures)  Result Value Ref Range   Vit D, 25-Hydroxy 32.8 30.0 - 100.0 ng/mL  B12  Result Value Ref Range   Vitamin B-12 989 232 - 1,245 pg/mL  Hepatitis C Antibody  Result Value Ref Range   Hep C Virus Ab <0.1 0.0 - 0.9 s/co ratio  HIV antibody (with reflex)  Result Value Ref Range   HIV Screen 4th Generation wRfx Non Reactive Non Reactive  POCT urinalysis dipstick  Result Value Ref Range   Color, UA     Clarity, UA     Glucose, UA Negative Negative   Bilirubin, UA Negative    Ketones, UA Negative    Spec Grav, UA <=1.005 (A) 1.010 - 1.025   Blood, UA Negative    pH, UA 6.0 5.0 - 8.0   Protein, UA Negative Negative  Urobilinogen, UA 0.2 0.2 or 1.0 E.U./dL   Nitrite, UA Negative    Leukocytes, UA Negative Negative   Appearance     Odor      Assessment & Plan     Problem List Items Addressed This Visit      Cardiovascular and Mediastinum   POTS (postural orthostatic tachycardia syndrome)    Stable She has been encouraged to increase fluid and salt intake Continue compression stockings - new Rx given      Relevant Medications   omega-3 acid ethyl esters (LOVAZA) 1 g capsule     Respiratory   OSA on CPAP    Chronic and stable Still having sleeping issues and daytime drowsiness Continue CPAP Followed by sleep medicine Consider nuvigil/provigil        Endocrine   Iodine deficiency    Recheck iodine and TSH      Relevant Orders   Iodine, Serum/Plasma (Completed)   TSH + free T4 (Completed)     Nervous and Auditory   Dysautonomia (HCC)    Encouraged  fluid and salt intake No change to medications        Musculoskeletal and Integument   Ehlers-Danlos syndrome    Patient is hypermobile EDSwhich contributes to several of her other medical problems incl her shoulder instability, joint pain, TMD She is followed by Ortho, Neurology, PT Will look into EDS specialist No changes to medications today Avoid narcotics and fluoroquinolones      Psoriatic arthritis ([place])    New diagnosis  Followed by Rheum Continue Otezla      Osteopenia of multiple sites    Continue Calcium and Vit D supplement      Relevant Orders   VITAMIN D 25 Hydroxy (Vit-D Deficiency, Fractures) (Completed)     Genitourinary   Vaginal dryness    Postmenopausal estrogen deficiency Continue topical estrogen        Other   Chronic fatigue    Longstanding issue On CPAP with good compliance but still with excessive daytime drowsiness Will check labs for possible underlying causes  Could consider talking to sleep specialist about Nuvigil/Provigil or further sleep testing      Relevant Orders   Comprehensive metabolic panel (Completed)   VITAMIN D 25 Hydroxy (Vit-D Deficiency, Fractures) (Completed)   B12 (Completed)   Hypercholesterolemia    Not currently on a statin Recheck FLP and CMP      Relevant Medications   omega-3 acid ethyl esters (LOVAZA) 1 g capsule   Other Relevant Orders   Lipid panel (Completed)   Comprehensive metabolic panel (Completed)   Hyperglycemia    Discussed low carb diet Recheck A1c      Relevant Orders   Hemoglobin A1c (Completed)   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HCC)    Followed by Allergy and immunology She is taking H1 and H2 blockers as well as Singulair to suppress histamine response In event of hospitalization, needs Benadryl administered Has EpiPen available      Occipital neuralgia    Followed by neurology      Relevant Orders   B12 (Completed)   Paresthesia    Followed by neurology Check B12       Relevant Orders   B12 (Completed)   Chronic pain syndrome    Long standing Related to EDS and trigeminal/occipital neuralgia Followed by NEurology No narcotics On disability - disability paperwork scanned to chart       Other Visit Diagnoses    Has daytime drowsiness    -  Primary   Screening for HIV (human immunodeficiency virus)       Relevant Orders   HIV antibody (with reflex) (Completed)   Need for hepatitis C screening test       Relevant Orders   Hepatitis C Antibody (Completed)   Vitamin D deficiency       Relevant Orders   VITAMIN D 25 Hydroxy (Vit-D Deficiency, Fractures) (Completed)   Urinary frequency       Relevant Orders   POCT urinalysis dipstick (Completed)       Return in about 6 months (around 12/10/2020) for chronic disease f/u.   Total time spent on today's visit was greater than 40 minutes, including both face-to-face time and nonface-to-face time personally spent on review of chart (labs and imaging), discussing labs and goals, discussing further work-up, treatment options, referrals to specialist if needed, reviewing outside records of pertinent, answering patient's questions, and coordinating care.      I, [name], MD, have reviewed all documentation for this visit. The documentation on 06/14/20 for the exam, diagnosis, procedures, and orders are all accurate and complete.   Sheronica Corey, Dionne Bucy, MD, MPH [facility]

## 2020-06-14 ENCOUNTER — Encounter: Payer: Self-pay | Admitting: Family Medicine

## 2020-06-14 ENCOUNTER — Telehealth: Payer: Self-pay

## 2020-06-14 DIAGNOSIS — M8589 Other specified disorders of bone density and structure, multiple sites: Secondary | ICD-10-CM | POA: Insufficient documentation

## 2020-06-14 LAB — VITAMIN B12: Vitamin B-12: 989 pg/mL (ref 232–1245)

## 2020-06-14 LAB — VITAMIN D 25 HYDROXY (VIT D DEFICIENCY, FRACTURES): Vit D, 25-Hydroxy: 32.8 ng/mL (ref 30.0–100.0)

## 2020-06-14 LAB — COMPREHENSIVE METABOLIC PANEL
ALT: 19 IU/L (ref 0–32)
AST: 26 IU/L (ref 0–40)
Albumin/Globulin Ratio: 2 (ref 1.2–2.2)
Albumin: 4.8 g/dL (ref 3.8–4.9)
Alkaline Phosphatase: 109 IU/L (ref 44–121)
BUN/Creatinine Ratio: 23 (ref 9–23)
BUN: 15 mg/dL (ref 6–24)
Bilirubin Total: 0.3 mg/dL (ref 0.0–1.2)
CO2: 24 mmol/L (ref 20–29)
Calcium: 9.7 mg/dL (ref 8.7–10.2)
Chloride: 100 mmol/L (ref 96–106)
Creatinine, Ser: 0.64 mg/dL (ref 0.57–1.00)
GFR calc Af Amer: 119 mL/min/{1.73_m2} (ref >59)
GFR calc non Af Amer: 104 mL/min/{1.73_m2} (ref >59)
Globulin, Total: 2.4 g/dL (ref 1.5–4.5)
Glucose: 90 mg/dL (ref 65–99)
Potassium: 4.4 mmol/L (ref 3.5–5.2)
Sodium: 141 mmol/L (ref 134–144)
Total Protein: 7.2 g/dL (ref 6.0–8.5)

## 2020-06-14 LAB — LIPID PANEL
Chol/HDL Ratio: 2.6 ratio (ref 0.0–4.4)
Cholesterol, Total: 208 mg/dL — ABNORMAL HIGH (ref 100–199)
HDL: 80 mg/dL (ref >39)
LDL Chol Calc (NIH): 109 mg/dL — ABNORMAL HIGH (ref 0–99)
Triglycerides: 111 mg/dL (ref 0–149)
VLDL Cholesterol Cal: 19 mg/dL (ref 5–40)

## 2020-06-14 LAB — TSH+FREE T4
Free T4: 1.13 ng/dL (ref 0.82–1.77)
TSH: 1.6 u[IU]/mL (ref 0.450–4.500)

## 2020-06-14 LAB — HEMOGLOBIN A1C
Est. average glucose Bld gHb Est-mCnc: 100 mg/dL
Hgb A1c MFr Bld: 5.1 % (ref 4.8–5.6)

## 2020-06-14 LAB — HEPATITIS C ANTIBODY: Hep C Virus Ab: <0.1 s/co ratio (ref 0.0–0.9)

## 2020-06-14 LAB — IODINE, SERUM/PLASMA: Iodine: 45.4 ug/L (ref 40.0–92.0)

## 2020-06-14 LAB — HIV ANTIBODY (ROUTINE TESTING W REFLEX): HIV Screen 4th Generation wRfx: NONREACTIVE

## 2020-06-14 NOTE — Assessment & Plan Note (Addendum)
Encouraged fluid and salt intake No change to medications

## 2020-06-14 NOTE — Assessment & Plan Note (Signed)
Recheck iodine and TSH

## 2020-06-14 NOTE — Assessment & Plan Note (Signed)
Longstanding issue On CPAP with good compliance but still with excessive daytime drowsiness Will check labs for possible underlying causes  Could consider talking to sleep specialist about Nuvigil/Provigil or further sleep testing

## 2020-06-14 NOTE — Assessment & Plan Note (Signed)
Long standing Related to EDS and trigeminal/occipital neuralgia Followed by NEurology No narcotics On disability - disability paperwork scanned to chart

## 2020-06-14 NOTE — Assessment & Plan Note (Signed)
New diagnosis  Followed by Rheum Ian Bushman

## 2020-06-14 NOTE — Assessment & Plan Note (Signed)
Not currently on a statin Recheck FLP and CMP

## 2020-06-14 NOTE — Assessment & Plan Note (Addendum)
Stable She has been encouraged to increase fluid and salt intake Continue compression stockings - new Rx given

## 2020-06-14 NOTE — Assessment & Plan Note (Signed)
Followed by neurology Check B12

## 2020-06-14 NOTE — Assessment & Plan Note (Signed)
Postmenopausal estrogen deficiency Continue topical estrogen

## 2020-06-14 NOTE — Telephone Encounter (Signed)
-----   Message from Virginia Crews, MD sent at 06/14/2020 12:49 PM EDT ----- Iodine level normal

## 2020-06-14 NOTE — Assessment & Plan Note (Signed)
Followed by neurology.   

## 2020-06-14 NOTE — Telephone Encounter (Signed)
Written by Virginia Crews, MD on 06/14/2020 12:49 PM EDT View Past Comments Seen by patient [name] on 06/14/2020 12:50 PM

## 2020-06-14 NOTE — Assessment & Plan Note (Signed)
Discussed low-carb diet Recheck A1c 

## 2020-06-14 NOTE — Assessment & Plan Note (Signed)
Patient is hypermobile EDSwhich contributes to several of her other medical problems incl her shoulder instability, joint pain, TMD She is followed by Ortho, Neurology, PT Will look into EDS specialist No changes to medications today Avoid narcotics and fluoroquinolones

## 2020-06-14 NOTE — Assessment & Plan Note (Addendum)
Followed by Allergy and immunology She is taking H1 and H2 blockers as well as Singulair to suppress histamine response In event of hospitalization, needs Benadryl administered Has EpiPen available

## 2020-06-14 NOTE — Assessment & Plan Note (Signed)
Chronic and stable Still having sleeping issues and daytime drowsiness Continue CPAP Followed by sleep medicine Consider nuvigil/provigil

## 2020-06-14 NOTE — Assessment & Plan Note (Signed)
Continue Calcium and Vit D supplement

## 2020-06-14 NOTE — Telephone Encounter (Signed)
-----   Message from Virginia Crews, MD sent at 06/14/2020  9:36 AM EDT ----- Normal labs and urinalysis.  Cholesterol is slightly elevated, but The 10-year ASCVD (heart disease/stroke) risk score Mikey Bussing DC Jr., et al., 2013) is: 0.7%, which is low.  I recommend diet low in saturated fat and regular exercise - 30 min at least 5 times per week. Iodine level is pending

## 2020-06-14 NOTE — Telephone Encounter (Signed)
Written by Virginia Crews, MD on 06/14/2020 9:36 AM EDT Seen by patient Autumn Zhang on 06/14/2020 9:37 AM

## 2020-06-15 ENCOUNTER — Encounter: Payer: Self-pay | Admitting: Family Medicine

## 2020-06-21 ENCOUNTER — Other Ambulatory Visit: Payer: Self-pay

## 2020-06-21 ENCOUNTER — Ambulatory Visit (INDEPENDENT_AMBULATORY_CARE_PROVIDER_SITE_OTHER): Payer: Medicare Other

## 2020-06-21 DIAGNOSIS — Z23 Encounter for immunization: Secondary | ICD-10-CM | POA: Diagnosis not present

## 2020-06-22 ENCOUNTER — Telehealth: Payer: Self-pay

## 2020-06-22 NOTE — Telephone Encounter (Signed)
If Dr. Janese Banks wants her to do one, then we can schedule with her annual.

## 2020-06-22 NOTE — Telephone Encounter (Signed)
Pt has her annual with [facility] on 12/15 at 8am she needs to know if [facility] is ordering a u/s this year or next.  She states she has been doing the u/s yearly but wasn't sure if [facility] was going to start every other year.Please let pt know.

## 2020-06-25 ENCOUNTER — Encounter: Payer: Self-pay | Admitting: Oncology

## 2020-06-25 NOTE — Telephone Encounter (Signed)
Called pt, no answer, LVMTRC. 

## 2020-06-25 NOTE — Telephone Encounter (Signed)
Patient calling back about message left. Patient aware GA may be at at lunch will have her call back this afternoon

## 2020-06-25 NOTE — Telephone Encounter (Signed)
Pt aware and says Dr Janese Banks is leaving it up to you to decide.

## 2020-06-26 NOTE — Telephone Encounter (Signed)
Spoke with pt. Updated mutations in genetic testing panel since she had [facility] 2019. Reached out to Mikal Plane to see if she qualifies for updated genetic testing based on personal and FH colon cancer and other cancers. If so, will do further testing to rule out any other genetic mutations. As for now, pelvic u/s not indicated since gene neg for Lynch (pt's onc treating as if Lynch pos due to hx of cancer). U/S not great for ovar cancer detection and no FH ovar cancer. Pt also now on Medicare. Pt fine not doing u/s till further indicated.

## 2020-06-27 ENCOUNTER — Encounter: Payer: Self-pay | Admitting: Obstetrics and Gynecology

## 2020-06-28 ENCOUNTER — Encounter: Payer: Self-pay | Admitting: Family Medicine

## 2020-06-28 ENCOUNTER — Telehealth (INDEPENDENT_AMBULATORY_CARE_PROVIDER_SITE_OTHER): Payer: Medicare Other | Admitting: Family Medicine

## 2020-06-28 ENCOUNTER — Ambulatory Visit: Payer: Self-pay | Admitting: *Deleted

## 2020-06-28 DIAGNOSIS — G901 Familial dysautonomia [Riley-Day]: Secondary | ICD-10-CM | POA: Diagnosis not present

## 2020-06-28 DIAGNOSIS — Z8739 Personal history of other diseases of the musculoskeletal system and connective tissue: Secondary | ICD-10-CM | POA: Insufficient documentation

## 2020-06-28 NOTE — Progress Notes (Signed)
MyChart Video Visit    Virtual Visit via Video Note   This visit type was conducted due to national recommendations for restrictions regarding the COVID-19 Pandemic (e.g. social distancing) in an effort to limit this patient's exposure and mitigate transmission in our community. This patient is at least at moderate risk for complications without adequate follow up. This format is felt to be most appropriate for this patient at this time. Physical exam was limited by quality of the video and audio technology used for the visit.    Patient location: home Provider location: [place] involved in the visit: patient, provider  I discussed the limitations of evaluation and management by telemedicine and the availability of in person appointments. The patient expressed understanding and agreed to proceed.  Patient: Autumn Zhang   DOB: 02/12/1971   49 y.o. Female  MRN: [id number] Visit Date: 06/28/2020  Today's healthcare provider: [name], MD   Chief Complaint  Patient presents with  . Dysautonomia    Subjective    HPI  Patient has been having resurgence in symptoms associated with her dysautonomia diagnosis. She has  recently identified what she thinks is 2 possible triggers- GAMA-core vagus nerve stimulator for migraines-  Started symptoms (unaware when started treatment- didn't know it was not recommended in dysautonomia) , then also COVID booster. Patient reports BP variability 85/62 then 136/129- 117/111 last check- heart rate variability. Heart palpitation, dizziness, air hunger.  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 Smoking status: Never Smoker  . Smokeless tobacco: Never Used  Vaping Use  . Vaping Use: Never used  Substance Use Topics  . Alcohol use: Never  . Drug use: Never       Medications: Outpatient Medications Prior to Visit  Medication Sig  . acetaminophen (TYLENOL) 500 MG tablet Take 500 mg by mouth every 6 (six) hours as needed.  Marland Kitchen albuterol (PROVENTIL HFA;VENTOLIN HFA) 108 (90 Base) MCG/ACT inhaler Inhale 2 puffs into the lungs every 4 (four) hours as needed.   . Ascorbic Acid (VITAMIN C) 1000 MG tablet Take 1,000 mg by mouth daily. probably total is 900 mg  . aspirin 325 MG tablet Take by mouth.  Marland Kitchen augmented betamethasone dipropionate (DIPROLENE-AF) 0.05 % cream APPLY TWICE DAILY AS NEEDED TO THE AFFECTED AREAS FOR 5-7 DAYS FOR ECZEMA.  Marland Kitchen b complex vitamins capsule Take 1 capsule by mouth daily. 1/4 capsule a day  . BELSOMRA 5 MG TABS Take 1 tablet by mouth at bedtime as needed.   . Blood Pressure Monitoring (BLOOD PRESSURE CUFF) MISC Take blood pressure as needed for symptoms.  Azucena Freed Serrata (BOSWELLIA PO) Take 150 mg by mouth every morning.   . Calcium Citrate 200 MG TABS Take 600 mg by mouth.  . cromolyn (GASTROCROM) 100 MG/5ML solution Take by mouth 4 (four) times daily -  before meals and at bedtime.  . cycloSPORINE (RESTASIS) 0.05 % ophthalmic emulsion INSTILL 1 DROP INTO BOTH EYES TWICE A DAY  . desloratadine (CLARINEX) 5 MG tablet Take 5 mg by mouth 2 (two) times daily. An poss. 1 extra if needed  . diphenhydrAMINE (BENADRYL) 12.5 MG/5ML liquid Take by mouth 4 (four) times daily as needed.  . diphenhydrAMINE-Allantoin 2-0.5 % CREA Apply 1 application topically as needed.   . docusate sodium (COLACE) 100 MG capsule Take 100  mg by mouth every other day.   Marland Kitchen econazole nitrate 1 % cream APPLY TO FEET TWICE A DAY FOR 2 4 WEEKS AS NEEDED FOR FLARE  . EPINEPHrine 0.3 mg/0.3 mL IJ SOAJ injection INJECT ONE AUTO-INJECTOR INTRAMUSCULARLY AS NEEDED  . estradiol (ESTRACE) 0.1 MG/GM vaginal cream PLACE  ONE APPLICATORFUL   VAGINALLY TWICE A WEEK.  . famotidine (PEPCID) 10 MG tablet Take 10 mg by mouth daily.   . fexofenadine (ALLEGRA) 180 MG tablet Take  180 mg by mouth daily as needed.   Marland Kitchen guaifenesin (HUMIBID E) 400 MG TABS tablet Take 400 mg by mouth daily.   . hydrOXYzine (ATARAX/VISTARIL) 50 MG tablet Take 50 mg by mouth daily.   Marland Kitchen ketotifen (ZADITOR) 0.025 % ophthalmic solution Place 1 drop into both eyes daily as needed.   Marland Kitchen Ketotifen Fumarate POWD Take 1 mg by mouth in the morning and at bedtime. If needed 2 more time a day for total of 4  . L-Lysine 500 MG CAPS Take by mouth 2 (two) times daily.  Marland Kitchen L-Theanine 100 MG CAPS Take 200 mg by mouth daily.   Marland Kitchen levocetirizine (XYZAL) 5 MG tablet Take 1 tablet (5 mg total) by mouth 2 (two) times daily. (Patient taking differently: Take 5 mg by mouth daily. And some times 1 more extra)  . Light Mineral Oil-Mineral Oil 0.5-0.5 % EMUL Apply 1 application to eye daily.   . lipase/protease/amylase (CREON) 12000-38000 units CPEP capsule Take 12,000 Units by mouth as directed.  . loperamide (IMODIUM) 2 MG capsule Take by mouth as needed for diarrhea or loose stools.  . Magnesium Citrate POWD Take 435 mg by mouth daily.   . Melatonin 1 MG/ML LIQD Take by mouth daily.   . montelukast (SINGULAIR) 10 MG tablet Take 10 mg by mouth daily.   . Nerve Stimulator (CEFALY KIT) DEVI   . Non Gelatin Capsules, Empty, (CAPSULE #3 CLEAR/CLEAR VEG) CAPS Take 1 capsule by mouth daily.  Marland Kitchen omega-3 acid ethyl esters (LOVAZA) 1 g capsule Take 2 capsules (2 g total) by mouth 2 (two) times daily.  Marland Kitchen OTEZLA 30 MG TABS Take 1 tablet by mouth 2 (two) times daily.  . polyethylene glycol (MIRALAX / GLYCOLAX) packet Take 17 g by mouth daily as needed.   . pseudoephedrine (SUDAFED) 30 MG tablet Take 30 mg by mouth every 4 (four) hours as needed for congestion.   . Riboflavin (VITAMIN B-2 PO) Take by mouth.  . simethicone (MYLICON) 80 MG chewable tablet Chew 80 mg by mouth every 6 (six) hours as needed for flatulence.  . Sodium Fluoride (PREVIDENT 5000 BOOSTER) 1.1 % PSTE Place onto teeth.  . TRIAMCINOLONE ACETONIDE,NASAL, NA Place  into the nose as needed.   . UBRELVY 50 MG TABS TAKE 1 TABLET BY MOUTH ONCE AS NEEDED FOR UP TO 1 DOSE  . Vitamin D, Cholecalciferol, 10 MCG (400 UNIT) TABS Take by mouth daily.    No facility-administered medications prior to visit.    Review of Systems     Objective    There were no vitals taken for this visit. BP Readings from Last 3 Encounters:  06/12/20 (!) 108/59  06/08/20 112/71  11/22/19 113/74   Wt Readings from Last 3 Encounters:  06/12/20 141 lb (64 kg)  06/08/20 140 lb (63.5 kg)  11/22/19 134 lb (60.8 kg)      Physical Exam Constitutional:      General: She is not in acute distress.    Appearance: Normal appearance.  HENT:     Head: Normocephalic and atraumatic.  Pulmonary:     Effort: Pulmonary effort is normal. No respiratory distress.  Neurological:     Mental Status: She is alert and oriented to person, place, and time. Mental status is at baseline.  Psychiatric:        Mood and Affect: Mood normal.        Behavior: Behavior normal.        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     I discussed the assessment and treatment plan with the patient. The patient was provided an opportunity to ask questions and all were answered. The patient agreed with the plan and demonstrated an understanding of the instructions.   The patient was advised to call back or seek an in-person evaluation if the symptoms worsen  or if the condition fails to improve as anticipated.  I provided 30 minutes of non-face-to-face time during this encounter.  I, [name], MD, have reviewed all documentation for this visit. The documentation on 06/29/20 for the exam, diagnosis, procedures, and orders are all accurate and complete.   Shelisha Gautier, Dionne Bucy, MD, MPH [facility]

## 2020-06-28 NOTE — Telephone Encounter (Signed)
Patient has been having resurgence in symptoms associated with her dysautonomia diagnosis.She has  recently identified what she thinks is 2 possible triggers- GAMA-core stimulator for migraines-  Started symptoms(unaware when started treatment-not recommended) , COVID booster. Patient reports BP variability 85/62 then 136/129- 117/111 last check- heart rate variability. Heart palpitation, dizziness, air hunger.  BP- 125/81 P 93- patient reports she is having variances that she feels every other day-  She states she has inconsistent phases of struggling for breathe to being fine. Appointment scheduled.  Reason for Disposition . [1] Longstanding difficulty breathing AND [2] not responding to usual therapy    Patient is calling with exacerbation of chronic illness/condition. Appointment scheduled  Answer Assessment - Initial Assessment Questions 1. RESPIRATORY STATUS: "Describe your breathing?" (e.g., wheezing, shortness of breath, unable to speak, severe coughing)      SOB- struggling- to normal 2. ONSET: "When did this breathing problem begin?"      1 week 3. PATTERN "Does the difficult breathing come and go, or has it been constant since it started?"      Comes and goes 4. SEVERITY: "How bad is your breathing?" (e.g., mild, moderate, severe)    - MILD: No SOB at rest, mild SOB with walking, speaks normally in sentences, can lay down, no retractions, pulse < 100.    - MODERATE: SOB at rest, SOB with minimal exertion and prefers to sit, cannot lie down flat, speaks in phrases, mild retractions, audible wheezing, pulse 100-120.    - SEVERE: Very SOB at rest, speaks in single words, struggling to breathe, sitting hunched forward, retractions, pulse > 120      Mild to severe 5. RECURRENT SYMPTOM: "Have you had difficulty breathing before?" If Yes, ask: "When was the last time?" and "What happened that time?"      Yes- associated with dysautonomia 6. CARDIAC HISTORY: "Do you have any history of heart  disease?" (e.g., heart attack, angina, bypass surgery, angioplasty)      Not asked 7. LUNG HISTORY: "Do you have any history of lung disease?"  (e.g., pulmonary embolus, asthma, emphysema)     Not asked 8. CAUSE: "What do you think is causing the breathing problem?"      exacerbation for chronic illness 9. OTHER SYMPTOMS: "Do you have any other symptoms? (e.g., dizziness, runny nose, cough, chest pain, fever)     High/low BP, dizziness 10. PREGNANCY: "Is there any chance you are pregnant?" "When was your last menstrual period?"       Not asked 11. TRAVEL: "Have you traveled out of the country in the last month?" (e.g., travel history, exposures)       n/a  Protocols used: BREATHING DIFFICULTY-A-AH

## 2020-06-29 ENCOUNTER — Encounter: Payer: Self-pay | Admitting: Family Medicine

## 2020-06-29 NOTE — Assessment & Plan Note (Signed)
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

## 2020-07-13 ENCOUNTER — Encounter: Payer: Self-pay | Admitting: Family Medicine

## 2020-07-13 NOTE — Telephone Encounter (Signed)
See vaccine info to abstract

## 2020-07-16 ENCOUNTER — Other Ambulatory Visit: Payer: Self-pay | Admitting: Obstetrics and Gynecology

## 2020-07-16 DIAGNOSIS — N898 Other specified noninflammatory disorders of vagina: Secondary | ICD-10-CM

## 2020-07-17 ENCOUNTER — Other Ambulatory Visit: Payer: Self-pay | Admitting: Obstetrics and Gynecology

## 2020-07-17 DIAGNOSIS — N898 Other specified noninflammatory disorders of vagina: Secondary | ICD-10-CM

## 2020-07-17 MED ORDER — ESTRADIOL 0.1 MG/GM VA CREA: TOPICAL_CREAM | VAGINAL | 0 refills | Status: DC

## 2020-07-17 NOTE — Telephone Encounter (Signed)
Annual 08/29/20

## 2020-07-17 NOTE — Telephone Encounter (Signed)
Updated in chart

## 2020-07-18 ENCOUNTER — Telehealth (INDEPENDENT_AMBULATORY_CARE_PROVIDER_SITE_OTHER): Payer: Self-pay

## 2020-07-18 ENCOUNTER — Other Ambulatory Visit: Payer: Self-pay | Admitting: Family Medicine

## 2020-07-18 NOTE — Telephone Encounter (Signed)
Requested medication (s) are due for refill today: yes  Requested medication (s) are on the active medication list: no  Last refill: 06/24/20  Future visit scheduled: yes  Notes to clinic:  not delegated    Requested Prescriptions  Pending Prescriptions Disp Refills   RESTASIS 0.05 % ophthalmic emulsion [Pharmacy Med Name: RESTASIS 0.05% EYE EMULSION] 180 mL 1    Sig: INSTILL 1 DROP INTO BOTH EYES TWICE A DAY      Not Delegated - Immunology:  Immunosuppressive Agents - cyclosporine (Ocular) Failed - 07/18/2020 10:31 AM      Failed - This refill cannot be delegated      Passed - Valid encounter within last 12 months    Recent Outpatient Visits           2 weeks ago Dysautonomia [facility])   [place], Dionne Bucy, MD   1 month ago Has daytime drowsiness   [facility], Dionne Bucy, MD   1 month ago [employer] [facility] [employer] preventive visit   [facility], Dionne Bucy, MD   7 months ago Osteopenia, unspecified location   [facility], Dionne Bucy, MD   1 year ago [place], [place]       Future Appointments             In 4 months Sindy Guadeloupe, MD [facility]   In 5 months [place], Dionne Bucy, [place], [place]

## 2020-07-19 NOTE — Telephone Encounter (Addendum)
Completed.

## 2020-07-20 ENCOUNTER — Telehealth: Payer: Self-pay | Admitting: *Deleted

## 2020-07-20 NOTE — Telephone Encounter (Signed)
Pt calling; has been corresponding c [facility]; has u/s sched for 07/26/20 and f/u c [facility] afterward - that was the soonest available; Pt is wondering if she needs to try to find a way to get u/s done sooner b/c she is in a lot of pain - hard to think.  [phone]

## 2020-07-20 NOTE — Telephone Encounter (Signed)
I called and spoke to pt and she had spoke to GI earlier today and the nurse told the pt to send message to GI doctor and they will respond. The response was miralax.  I told her that Lauren had rec: miralax and senna. The pt. States she can't take senna. She did try 2 doses of miralax and has diarrhea but still feels like the pain in her stomach is worse and she feels like she might have a blockage. I offered her an appt Monday at 11 am and she accepted it. I did tell pt. That she could go to ER or she could call the GI MD on call and see what else they can offer to see if she could get relief for pain. She will look into these suggestions and she will call Monday if she does not need the appt here.

## 2020-07-20 NOTE — Telephone Encounter (Signed)
Agree with plan for GI and Gyn. Can also see her in [facility] if she'd like?

## 2020-07-20 NOTE — Telephone Encounter (Signed)
Patient called reporting that she is having upper pelvic pain and pressure, fatique, severe constipation, bloating with early satiety, urinary frequency and has contacted her GYN who has ordered an [place] for next Thursday which was the first available time they had and that she has also contacted her GI doc but has not heard back form them as of yet. Sh eis asking if Dr Janese Banks would like to add anything to her treatment at this time. She said she feels as if she had a lead filled football in her stomach. Please advise

## 2020-07-22 NOTE — Telephone Encounter (Signed)
If she would rather do u/s sooner and then see me on 11/11, that is fine, too. That would at least give [place] info if it's GYN etiology. Pls f/u with pt with this option.  Thx

## 2020-07-23 ENCOUNTER — Inpatient Hospital Stay: Payer: Medicare Other | Admitting: Nurse Practitioner

## 2020-07-23 NOTE — Telephone Encounter (Signed)
Called and spoke with patient. I offered the only opening we have this week for 4:30 on 11/10. Patient states she would be unable to come at that time. So patient will be here for scheduled appointment on 07/26/20

## 2020-07-23 NOTE — Telephone Encounter (Signed)
Autumn Zhang, can you follow up on this? Is there an u/s opening sooner than 11/11? Thx.

## 2020-07-26 ENCOUNTER — Ambulatory Visit: Payer: No Typology Code available for payment source | Admitting: Obstetrics and Gynecology

## 2020-07-26 ENCOUNTER — Other Ambulatory Visit: Payer: No Typology Code available for payment source

## 2020-08-01 ENCOUNTER — Encounter: Payer: Self-pay | Admitting: Family Medicine

## 2020-08-02 ENCOUNTER — Other Ambulatory Visit: Payer: Self-pay

## 2020-08-02 ENCOUNTER — Ambulatory Visit: Payer: No Typology Code available for payment source | Admitting: Obstetrics and Gynecology

## 2020-08-02 ENCOUNTER — Telehealth: Payer: Self-pay

## 2020-08-02 ENCOUNTER — Ambulatory Visit (INDEPENDENT_AMBULATORY_CARE_PROVIDER_SITE_OTHER): Payer: Medicare Other | Admitting: Obstetrics and Gynecology

## 2020-08-02 ENCOUNTER — Encounter: Payer: Self-pay | Admitting: Obstetrics and Gynecology

## 2020-08-02 ENCOUNTER — Other Ambulatory Visit: Payer: No Typology Code available for payment source

## 2020-08-02 VITALS — BP 110/70 | Ht 61.75 in | Wt 143.0 lb

## 2020-08-02 DIAGNOSIS — M79621 Pain in right upper arm: Secondary | ICD-10-CM | POA: Diagnosis not present

## 2020-08-02 DIAGNOSIS — R350 Frequency of micturition: Secondary | ICD-10-CM

## 2020-08-02 DIAGNOSIS — R102 Pelvic and perineal pain: Secondary | ICD-10-CM

## 2020-08-02 DIAGNOSIS — Z85038 Personal history of other malignant neoplasm of large intestine: Secondary | ICD-10-CM

## 2020-08-02 LAB — POCT URINALYSIS DIPSTICK
Bilirubin, UA: NEGATIVE
Blood, UA: NEGATIVE
Glucose, UA: NEGATIVE
Ketones, UA: NEGATIVE
Leukocytes, UA: NEGATIVE
Nitrite, UA: NEGATIVE
Protein, UA: NEGATIVE
Spec Grav, UA: 1.02 (ref 1.010–1.025)
pH, UA: 6 (ref 5.0–8.0)

## 2020-08-02 MED ORDER — OMEGA-3-ACID ETHYL ESTERS 1 G PO CAPS: 2.0000 g | ORAL_CAPSULE | Freq: Two times a day (BID) | ORAL | 3 refills | Status: DC

## 2020-08-02 NOTE — Telephone Encounter (Signed)
Refill request

## 2020-08-02 NOTE — Progress Notes (Signed)
Autumn Crews, MD   Chief Complaint  Patient presents with  . Follow-up    lymph node under right arm, tender    HPI:      [name] is a 49 y.o. G1P0010 whose LMP was No LMP recorded. Patient is postmenopausal., presents today for pelvic pain/pressure for many yrs but increased over the past month. Hx of stage 2 colon cancer with hemicolectomy. Pain is sharp and/or achy, sometimes with sensation of going to have diarrhea but with no BM.  Has abd tenderness with palpation and bloating. Hx of constipation with diarrhea occasionally. Is seeing GI, tried colon cleanse with excessive diarrhea, still bloated. Is supposed to be getting abd/pelvic MRI (allergic to contrast so can't do CT) but doesn't have appt yet. Also has urinary frequency and urgency at times. Drinks a little caffeine daily. Has strong FH Lynch syndrome cancers but she is MyRisk neg except RPS20 VUS.  She is not sex active due to dyspareunia.  We had scheduled u/s at today's appt but ultrasonographer is out.  Last pap neg/neg HPV 12/19.  Pt also with RT axilla tenderness, feels c/w swollen lymph node in past, but no discrete mass palpated. No bites, trauma, cuts, vaccines to RT arm. Sx for 2 wks, no change. Neg mammo 7/21 at [facility] 12/21   Past Medical History:  Diagnosis Date  . Allergy   . Anemia   . Anxiety   . Arthritis   . Asthma   . Attention deficit hyperactivity disorder (ADHD), combined type 10/18/2015   Diagnosed many years ago.  Has been doing well on Ritalin 38m QID to six times a day. Long acting hasn't worked well in the past and she has not wanted to try Adderall. She is taking wellbutrin as well, but she has been weaning medication due to SE migraine.  She is now down to Wellbutrin one quarter tablet daily.  Last Assessment & Plan:  A/P: Stable.  Continue current dosing.  Refilled 3 months  . Basal cell carcinoma   . BRCA negative 09/2018   [facility] [place] [facility];  1/20 MyRisk neg except RPS20 VUS; IBIS=10.8%/riskscore=12%  . [place] ([place]   . Dysautonomia ([place]   . Ehlers-Danlos syndrome type III   . Family history of colon cancer   . GERD (gastroesophageal reflux disease)   . Hyperlipidemia   . Idiopathic small fiber peripheral neuropathy   . Leiomyoma   . 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 Migraines   . Neck pain   . Occipital neuralgia   . Sleep apnea     Past Surgical History:  Procedure Laterality Date  . APPENDECTOMY     removed with colectomy  . BREAST SURGERY     reduction  . COLON SURGERY  2008   for colon cancer, ~1/3 of colon removed, primary reanastimosis  . COLONOSCOPY WITH PROPOFOL N/A 10/07/2018   Procedure: COLONOSCOPY WITH PROPOFOL;  Surgeon: VLin Landsman MD;  [facility];  Service: Gastroenterology;  Laterality: N/A;  . ENDOMETRIAL ABLATION      Family History  Problem Relation Age of Onset  . Colon cancer Father 416 . Breast cancer Maternal Aunt 50       BRCA neg  . Endometrial cancer Maternal Aunt 50  . Colon cancer Maternal Grandmother 857 . Migraines Maternal Grandmother   . Tremor Maternal Grandmother   . Leukemia Maternal Grandfather 865 . Food Allergy  Maternal Grandfather   . Migraines Mother   . Glaucoma Mother   . Cataracts Mother   . Tremor Mother   . Fibromyalgia Mother   . Heart disease Mother   . Colon polyps Brother        non-cancerous    Social History   Socioeconomic History  . Marital status: Married    Spouse name: Not on file  . Number of children: 0  . Years of education: Not on file  . Highest education level: Not on file  Occupational History  . Occupation: SSI and disability  Tobacco Use  . Smoking status: Never Smoker  . Smokeless tobacco: Never Used  Vaping Use  . Vaping Use: Never used  Substance and Sexual Activity  . Alcohol use: Never  . Drug use: Never  . Sexual activity: Not Currently    Birth control/protection: None  Other Topics  Concern  . Not on file  Social History Narrative  . Not on file   Social Determinants of Health   Financial Resource Strain:   . Difficulty of Paying Living Expenses: Not on file  Food Insecurity:   . Worried About Charity fundraiser in the Last Year: Not on file  . Ran Out of Food in the Last Year: Not on file  Transportation Needs:   . Lack of Transportation (Medical): Not on file  . Lack of Transportation (Non-Medical): Not on file  Physical Activity:   . Days of Exercise per Week: Not on file  . Minutes of Exercise per Session: Not on file  Stress:   . Feeling of Stress : Not on file  Social Connections:   . Frequency of Communication with Friends and Family: Not on file  . Frequency of Social Gatherings with Friends and Family: Not on file  . Attends Religious Services: Not on file  . Active Member of Clubs or Organizations: Not on file  . Attends Archivist Meetings: Not on file  . Marital Status: Not on file  Intimate Partner Violence:   . Fear of Current or Ex-Partner: Not on file  . Emotionally Abused: Not on file  . Physically Abused: Not on file  . Sexually Abused: Not on file    Outpatient Medications Prior to Visit  Medication Sig Dispense Refill  . acetaminophen (TYLENOL) 500 MG tablet Take 500 mg by mouth every 6 (six) hours as needed.    Marland Kitchen albuterol (PROVENTIL HFA;VENTOLIN HFA) 108 (90 Base) MCG/ACT inhaler Inhale 2 puffs into the lungs every 4 (four) hours as needed.     . Ascorbic Acid (VITAMIN C) 1000 MG tablet Take 1,000 mg by mouth daily. probably total is 900 mg    . aspirin 325 MG tablet Take by mouth.    Marland Kitchen augmented betamethasone dipropionate (DIPROLENE-AF) 0.05 % cream APPLY TWICE DAILY AS NEEDED TO THE AFFECTED AREAS FOR 5-7 DAYS FOR ECZEMA.    Marland Kitchen b complex vitamins capsule Take 1 capsule by mouth daily. 1/4 capsule a day    . BELSOMRA 5 MG TABS Take 1 tablet by mouth at bedtime as needed.     . Blood Pressure Monitoring (BLOOD PRESSURE  CUFF) MISC Take blood pressure as needed for symptoms.    Azucena Freed Serrata (BOSWELLIA PO) Take 150 mg by mouth every morning.     . Calcium Citrate 200 MG TABS Take 600 mg by mouth.    . cromolyn (GASTROCROM) 100 MG/5ML solution Take by mouth 4 (four) times daily -  before meals and at bedtime.    Marland Kitchen desloratadine (CLARINEX) 5 MG tablet Take 5 mg by mouth 2 (two) times daily. An poss. 1 extra if needed    . diphenhydrAMINE (BENADRYL) 12.5 MG/5ML liquid Take by mouth 4 (four) times daily as needed.    . diphenhydrAMINE-Allantoin 2-0.5 % CREA Apply 1 application topically as needed.     . docusate sodium (COLACE) 100 MG capsule Take 100 mg by mouth every other day.     Marland Kitchen econazole nitrate 1 % cream APPLY TO FEET TWICE A DAY FOR 2 4 WEEKS AS NEEDED FOR FLARE    . EPINEPHrine 0.3 mg/0.3 mL IJ SOAJ injection INJECT ONE AUTO-INJECTOR INTRAMUSCULARLY AS NEEDED    . estradiol (ESTRACE) 0.1 MG/GM vaginal cream PLACE 1 APPLICATORFUL      VAGINALLY TWICE A WEEK 42.5 g 0  . famotidine (PEPCID) 10 MG tablet Take 10 mg by mouth daily.     . fexofenadine (ALLEGRA) 180 MG tablet Take 180 mg by mouth daily as needed.     Marland Kitchen guaifenesin (HUMIBID E) 400 MG TABS tablet Take 400 mg by mouth daily.     . hydrOXYzine (ATARAX/VISTARIL) 50 MG tablet Take 50 mg by mouth daily.     Marland Kitchen ketotifen (ZADITOR) 0.025 % ophthalmic solution Place 1 drop into both eyes daily as needed.     Marland Kitchen Ketotifen Fumarate POWD Take 1 mg by mouth in the morning and at bedtime. If needed 2 more time a day for total of 4    . L-Lysine 500 MG CAPS Take by mouth 2 (two) times daily.    Marland Kitchen L-Theanine 100 MG CAPS Take 200 mg by mouth daily.     Marland Kitchen levocetirizine (XYZAL) 5 MG tablet Take 1 tablet (5 mg total) by mouth 2 (two) times daily. (Patient taking differently: Take 5 mg by mouth daily. And some times 1 more extra) 180 tablet 3  . Light Mineral Oil-Mineral Oil 0.5-0.5 % EMUL Apply 1 application to eye daily.     . lipase/protease/amylase (CREON)  12000-38000 units CPEP capsule Take 12,000 Units by mouth as directed.    . loperamide (IMODIUM) 2 MG capsule Take by mouth as needed for diarrhea or loose stools.    . Magnesium Citrate POWD Take 435 mg by mouth daily.     . Melatonin 1 MG/ML LIQD Take by mouth daily.     . montelukast (SINGULAIR) 10 MG tablet Take 10 mg by mouth daily.     . Nerve Stimulator (CEFALY KIT) DEVI     . Non Gelatin Capsules, Empty, (CAPSULE #3 CLEAR/CLEAR VEG) CAPS Take 1 capsule by mouth daily.    Marland Kitchen omega-3 acid ethyl esters (LOVAZA) 1 g capsule Take 2 capsules (2 g total) by mouth 2 (two) times daily. 360 capsule 3  . OTEZLA 30 MG TABS Take 1 tablet by mouth 2 (two) times daily.    . polyethylene glycol (MIRALAX / GLYCOLAX) packet Take 17 g by mouth daily as needed.     . pseudoephedrine (SUDAFED) 30 MG tablet Take 30 mg by mouth every 4 (four) hours as needed for congestion.     . RESTASIS 0.05 % ophthalmic emulsion INSTILL 1 DROP INTO BOTH EYES TWICE A DAY 180 mL 1  . Riboflavin (VITAMIN B-2 PO) Take by mouth.    . simethicone (MYLICON) 80 MG chewable tablet Chew 80 mg by mouth every 6 (six) hours as needed for flatulence.    . Sodium Fluoride (PREVIDENT 5000  BOOSTER) 1.1 % PSTE Place onto teeth.    . TRIAMCINOLONE ACETONIDE,NASAL, NA Place into the nose as needed.     . UBRELVY 50 MG TABS TAKE 1 TABLET BY MOUTH ONCE AS NEEDED FOR UP TO 1 DOSE    . Vitamin D, Cholecalciferol, 10 MCG (400 UNIT) TABS Take by mouth daily.      No facility-administered medications prior to visit.      ROS:  Review of Systems  Constitutional: Negative for fever.  Gastrointestinal: Positive for abdominal distention, abdominal pain, constipation and diarrhea. Negative for blood in stool, nausea and vomiting.  Genitourinary: Positive for dyspareunia, frequency, pelvic pain and urgency. Negative for dysuria, flank pain, hematuria, vaginal bleeding, vaginal discharge and vaginal pain.  Musculoskeletal: Negative for back pain.   Skin: Negative for rash.  BREAST: No symptoms   OBJECTIVE:   Vitals:  BP 110/70   Ht 5' 1.75" (1.568 m)   Wt 143 lb (64.9 kg)   BMI 26.37 kg/m   Physical Exam Vitals reviewed.  Constitutional:      Appearance: She is well-developed.  Pulmonary:     Effort: Pulmonary effort is normal.  Chest:     Breasts: Breasts are symmetrical.        Right: No inverted nipple, mass, nipple discharge, skin change or tenderness.        Left: No inverted nipple, mass, nipple discharge, skin change or tenderness.       Comments: NEG BREAST EXAM Abdominal:     Palpations: Abdomen is soft.     Tenderness: There is generalized abdominal tenderness. There is no guarding or rebound.  Genitourinary:    General: Normal vulva.     Pubic Area: No rash.      Labia:        Right: No rash, tenderness or lesion.        Left: No rash, tenderness or lesion.      Vagina: Normal. No vaginal discharge, erythema or tenderness.     Cervix: Normal.     Uterus: Normal. Tender. Not enlarged.      Adnexa:        Right: Tenderness present. No mass.         Left: Tenderness present. No mass.    Musculoskeletal:        General: Normal range of motion.     Cervical back: Normal range of motion.  Lymphadenopathy:     Upper Body:     Right upper body: No supraclavicular or axillary adenopathy.     Left upper body: No supraclavicular or axillary adenopathy.     Comments: TENDER TO PALPATE RT AXILLA; NO DISCRETE MASS, QUESTION LAN  Skin:    General: Skin is warm and dry.  Neurological:     General: No focal deficit present.     Mental Status: She is alert and oriented to person, place, and time.     Cranial Nerves: No cranial nerve deficit.  Psychiatric:        Mood and Affect: Mood normal.        Behavior: Behavior normal.        Thought Content: Thought content normal.        Judgment: Judgment normal.     Results: Results for orders placed or performed in visit on 08/02/20 (from the past 24  hour(s))  POCT Urinalysis Dipstick     Status: Normal   Collection Time: 08/02/20  2:48 PM  Result Value Ref Range   Color,  UA yellow    Clarity, UA clear    Glucose, UA Negative Negative   Bilirubin, UA neg    Ketones, UA neg    Spec Grav, UA 1.020 1.010 - 1.025   Blood, UA neg    pH, UA 6.0 5.0 - 8.0   Protein, UA Negative Negative   Urobilinogen, UA     Nitrite, UA neg    Leukocytes, UA Negative Negative   Appearance     Odor       Assessment/Plan: Pelvic pain - Plan: US PELVIS TRANSVAGINAL NON-OB (TV ONLY); tender diffusely on exam. Check GYN u/s. If neg, sx c/w GI etiology and pt to f/u with GI. Will f/u with results.   History of colon cancer--followed by onc. Neg MyRisk testing.  Urinary frequency - Plan: POCT Urinalysis Dipstick; neg UA. D/C caffeine. Could also be due to bloating/pressure on bladder. Fu/ prn.   Axillary pain, right - Plan: [place] AXILLA RIGHT; tender on exam, no mass palpated. Neg breast exam.  Question deep LAN. Check u/s. If neg, can follow expectantly.   PT HAD ANNUAL [place] 12/21 BUT FULL EXAM DONE TODAY, SO NO  LONGER INDICATED THIS YR   Return in about 1 year (around 08/02/2021).  Dashon Mcintire B. Naren Benally, PA-C 08/02/2020 3:01 PM

## 2020-08-02 NOTE — Patient Instructions (Signed)
I value your feedback and entrusting us with your care. If you get a  patient survey, I would appreciate you taking the time to let us know about your experience today. Thank you!  As of August 25, 2019, your lab results will be released to your MyChart immediately, before I even have a chance to see them. Please give me time to review them and contact you if there are any abnormalities. Thank you for your patience.  

## 2020-08-14 ENCOUNTER — Telehealth: Payer: Self-pay | Admitting: Obstetrics and Gynecology

## 2020-08-14 NOTE — Telephone Encounter (Signed)
Pt calling requesting information for ultrasound orders to be scheduled.

## 2020-08-14 NOTE — Telephone Encounter (Signed)
Called pt and adv that I contacted [facility] regarding U/S orders. There was nothing set up in their system but the rep I spoke with remembered the patient's order. I adv that I had rec'd another pt order requesting a different form and fax #. The rep at [facility] recommended me trying to send the orders that way.  Pt was very understanding. I gave her the phone # to [facility] [phone] so that she follow up with them in order to schedule at her convenience.

## 2020-08-16 ENCOUNTER — Encounter: Payer: Self-pay | Admitting: Obstetrics and Gynecology

## 2020-08-23 ENCOUNTER — Other Ambulatory Visit: Payer: Self-pay | Admitting: Obstetrics and Gynecology

## 2020-08-23 DIAGNOSIS — N898 Other specified noninflammatory disorders of vagina: Secondary | ICD-10-CM

## 2020-08-24 ENCOUNTER — Other Ambulatory Visit: Payer: No Typology Code available for payment source

## 2020-08-27 ENCOUNTER — Encounter: Payer: Self-pay | Admitting: Obstetrics and Gynecology

## 2020-08-28 ENCOUNTER — Other Ambulatory Visit: Payer: Self-pay

## 2020-08-28 ENCOUNTER — Inpatient Hospital Stay: Payer: Medicare Other | Attending: Oncology

## 2020-08-28 DIAGNOSIS — D509 Iron deficiency anemia, unspecified: Secondary | ICD-10-CM | POA: Diagnosis not present

## 2020-08-28 DIAGNOSIS — E538 Deficiency of other specified B group vitamins: Secondary | ICD-10-CM | POA: Insufficient documentation

## 2020-08-28 DIAGNOSIS — D531 Other megaloblastic anemias, not elsewhere classified: Secondary | ICD-10-CM

## 2020-08-28 LAB — COMPREHENSIVE METABOLIC PANEL
ALT: 21 U/L (ref 0–44)
AST: 24 U/L (ref 15–41)
Albumin: 4.4 g/dL (ref 3.5–5.0)
Alkaline Phosphatase: 78 U/L (ref 38–126)
Anion gap: 10 (ref 5–15)
BUN: 11 mg/dL (ref 6–20)
CO2: 28 mmol/L (ref 22–32)
Calcium: 9.9 mg/dL (ref 8.9–10.3)
Chloride: 101 mmol/L (ref 98–111)
Creatinine, Ser: 0.48 mg/dL (ref 0.44–1.00)
GFR, Estimated: >60 mL/min (ref >60)
Glucose, Bld: 85 mg/dL (ref 70–99)
Potassium: 4 mmol/L (ref 3.5–5.1)
Sodium: 139 mmol/L (ref 135–145)
Total Bilirubin: 0.6 mg/dL (ref 0.3–1.2)
Total Protein: 7.3 g/dL (ref 6.5–8.1)

## 2020-08-28 LAB — CBC WITH DIFFERENTIAL/PLATELET
Abs Immature Granulocytes: 0.01 10*3/uL (ref 0.00–0.07)
Basophils Absolute: 0.1 10*3/uL (ref 0.0–0.1)
Basophils Relative: 1 %
Eosinophils Absolute: 0.1 10*3/uL (ref 0.0–0.5)
Eosinophils Relative: 2 %
HCT: 38.4 % (ref 36.0–46.0)
Hemoglobin: 12.3 g/dL (ref 12.0–15.0)
Immature Granulocytes: 0 %
Lymphocytes Relative: 24 %
Lymphs Abs: 1.6 10*3/uL (ref 0.7–4.0)
MCH: 29.4 pg (ref 26.0–34.0)
MCHC: 32 g/dL (ref 30.0–36.0)
MCV: 91.6 fL (ref 80.0–100.0)
Monocytes Absolute: 0.5 10*3/uL (ref 0.1–1.0)
Monocytes Relative: 7 %
Neutro Abs: 4.4 10*3/uL (ref 1.7–7.7)
Neutrophils Relative %: 66 %
Platelets: 294 10*3/uL (ref 150–400)
RBC: 4.19 MIL/uL (ref 3.87–5.11)
RDW: 12.5 % (ref 11.5–15.5)
WBC: 6.6 10*3/uL (ref 4.0–10.5)
nRBC: 0 % (ref 0.0–0.2)

## 2020-08-28 LAB — IRON AND TIBC
Iron: 79 ug/dL (ref 28–170)
Saturation Ratios: 21 % (ref 10.4–31.8)
TIBC: 379 ug/dL (ref 250–450)
UIBC: 300 ug/dL

## 2020-08-28 LAB — FERRITIN: Ferritin: 133 ng/mL (ref 11–307)

## 2020-08-28 LAB — VITAMIN B12: Vitamin B-12: 1054 pg/mL — ABNORMAL HIGH (ref 180–914)

## 2020-08-29 ENCOUNTER — Ambulatory Visit: Payer: No Typology Code available for payment source | Admitting: Obstetrics and Gynecology

## 2020-09-05 NOTE — Telephone Encounter (Signed)
Pt aware that RT axillary u/s neg. Still having pain, must be MSK. Suggested pectoral stretching.  Still waiting to hear from GI re: f/u appt for pelvic pain/bloating. Had neg GYN u/s

## 2020-09-24 ENCOUNTER — Other Ambulatory Visit: Payer: Self-pay | Admitting: Obstetrics and Gynecology

## 2020-09-24 DIAGNOSIS — N898 Other specified noninflammatory disorders of vagina: Secondary | ICD-10-CM

## 2020-09-25 ENCOUNTER — Encounter: Payer: Self-pay | Admitting: Family Medicine

## 2020-11-02 ENCOUNTER — Encounter: Payer: Self-pay | Admitting: Family Medicine

## 2020-11-02 DIAGNOSIS — L4050 Arthropathic psoriasis, unspecified: Secondary | ICD-10-CM

## 2020-11-05 NOTE — Telephone Encounter (Signed)
Ok to send referral. Use psoriatic arthritis diagnosis on her problem list.

## 2020-11-06 ENCOUNTER — Encounter: Payer: Self-pay | Admitting: Oncology

## 2020-11-09 ENCOUNTER — Other Ambulatory Visit: Payer: Self-pay

## 2020-11-09 ENCOUNTER — Ambulatory Visit (INDEPENDENT_AMBULATORY_CARE_PROVIDER_SITE_OTHER): Payer: Medicare Other | Admitting: Family Medicine

## 2020-11-09 ENCOUNTER — Encounter: Payer: Self-pay | Admitting: Family Medicine

## 2020-11-09 VITALS — BP 100/68 | HR 82 | Temp 98.7°F | Resp 16 | Ht 62.0 in | Wt 144.0 lb

## 2020-11-09 DIAGNOSIS — L4050 Arthropathic psoriasis, unspecified: Secondary | ICD-10-CM

## 2020-11-09 NOTE — Assessment & Plan Note (Signed)
Long discussion today regarding treatment for psoriatic arthritis Reassured patient that rheumatology is going through the typical steps for treatment of this condition Discussed that it often takes several trials of medications to find the right 1 to control her condition The stiffness that she exhibits in the morning does seem to be related to uncontrolled psoriatic arthritis For the meantime, she will stay on Plaquenil and [place] She has had recent lab monitoring for this and does not need additional lab work today She will continue to follow-up with her [facility] rheumatologist until her upcoming appointment to establish with [facility] rheumatology She does have concerns about taking methotrexate while taking Plaquenil, but also at all given that 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

## 2020-11-09 NOTE — Progress Notes (Signed)
Established patient visit   Patient: Autumn Zhang   DOB: 1970-10-25   50 y.o. Female  MRN: [id number] Visit Date: 11/09/2020  Today's healthcare provider: [name], MD   Chief Complaint  Patient presents with   Follow-up   Subjective    HPI  Follow up for Psoriatic arthritis  The patient was last seen for this 6 months ago. Changes made at last visit include no changes. Patient is followed by Dr. Lahoma Rocker at [facility]. Patient would like to get second opinion from Dr. Brita Romp today.  She reports excellent compliance with treatment. She feels that condition is Worse. Patient reports swelling is worsening and present all day in legs and feet. Patient reports swelling on hands at night time. She is not having side effects.   Deidre Ala (worked for a bit, but then seemed to stop), added plaquenil (had to decrease the dose after having side effects - headaches, tinnitus). Tried stopping otezla (didn't seem to be working) and got worse again. Now back on it. Has not tried injectables. Next step is plaquenil and MTX.  -----------------------------------------------------------------------------------------    Patient Active Problem List   Diagnosis Date Noted   History of seronegative inflammatory arthritis 06/28/2020   Osteopenia of multiple sites 06/14/2020   Psoriatic arthritis ([place]) 06/12/2020   Iodine deficiency 06/12/2020   OSA on CPAP 06/12/2020   Chronic pain syndrome 06/12/2020   Psychophysiological insomnia 11/26/2019   Calcific tendinitis of right shoulder 04/10/2019   History of iron deficiency anemia 11/04/2018   History of basal cell carcinoma 10/07/2018   Vaginal dryness 09/07/2018   Chronic migraine with aura 07/15/2018   Irritable bowel syndrome with diarrhea 07/15/2018   Rectocele 05/21/2018   Ehlers-Danlos syndrome 03/15/2018   Patellar instability of left knee 03/15/2018   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   Instability of right shoulder joint 01/12/2018   Trigeminal neuralgia 12/25/2017   Instability of left shoulder joint 12/25/2017   Nausea 12/25/2017   Occipital neuralgia 12/25/2017   Dysautonomia ([place]) 12/25/2017   Hiatal hernia 10/14/2017   TMJ (dislocation of temporomandibular joint) 09/29/2017   Pulsatile tinnitus of left ear 12/12/2016   Restless leg syndrome 08/01/2016   Cervical dystonia 03/21/2016   Cervical spine disease 03/21/2016   Paresthesia 02/06/2016   Hypercholesterolemia 12/24/2015   Iron deficiency anemia 10/18/2015   Vitamin D deficiency disease 10/18/2015   Midline low back pain without sciatica 02/06/2015   Hyperglycemia 10/30/2014   History of colon cancer 09/27/2014   POTS (postural orthostatic tachycardia syndrome) 09/16/2003   Chronic fatigue 09/15/2000   Asthma 09/15/1988   Dyspnea 09/15/1988   Social History   Tobacco Use   Smoking status: Never Smoker   Smokeless tobacco: Never Used  Vaping Use   Vaping Use: Never used  Substance Use Topics   Alcohol use: Never   Drug use: Never   Allergies  Allergen Reactions   Ioversol Anxiety, Hives, Itching and Palpitations   Ciprofloxacin Other (See Comments)   Eggs Or Egg-Derived Products Other (See Comments)    Exacerbates neuralgia symptoms Exacerbates neuralgia symptoms Congestion & Headaches Exacerbates neuralgia symptoms Congestion & Headaches    Ginger Other (See Comments)    Congestion & Headaches Congestion & Headaches    Levofloxacin Other (See Comments)    Congestion & Headaches Congestion & Headaches    Other Other (See Comments)    Novocaine (IV) form Hearing loss Novocaine (IV) form Hearing loss  Congestion & Headaches Congestion &  Headaches Congestion & Headaches Congestion & Headaches  Other reaction(s): Unknown   Soybean-Containing Drug Products Other (See Comments)    Congestion & Headaches    Bee  Pollen    Bupropion    Celebrex [Celecoxib]    Codeine    Dexilant [Dexlansoprazole]    Dulera [Mometasone Furo-Formoterol Fum]    Fentanyl    Fluticasone    Gabapentin    Gluten Meal    Iodine    Lidocaine     Rash to topical cream   Lyrica [Pregabalin]    Mold Extract [Trichophyton]    Morphine And Related    Penicillins     Skin testing positive for allergy   Pollen Extract    Prilosec [Omeprazole]    Rizatriptan Other (See Comments)    Dizziness, headache, myalgias, nausea   Salmeterol Xinafoate    Silenor [Doxepin Hcl]    Stevioside Other (See Comments)   Tomato    Xylitol Other (See Comments)   Ethanol Dermatitis, Other (See Comments), Itching and Palpitations   Hydrocodone Nausea Only   Ibuprofen Nausea Only    At high doses At high doses    Lac Bovis Diarrhea    Facial burning sensation Facial burning sensation Facial burning sensation Facial burning sensation    Lanolin Other (See Comments)    Exacerbates neuralgia symptoms Wool causes hives.    Milk-Related Compounds Diarrhea    Facial burning sensation    Wheat Bran Diarrhea       Medications: Outpatient Medications Prior to Visit  Medication Sig   acetaminophen (TYLENOL) 500 MG tablet Take 500 mg by mouth every 6 (six) hours as needed.   albuterol (PROVENTIL HFA;VENTOLIN HFA) 108 (90 Base) MCG/ACT inhaler Inhale 2 puffs into the lungs every 4 (four) hours as needed.    Ascorbic Acid (VITAMIN C) 1000 MG tablet Take 1,000 mg by mouth daily. probably total is 900 mg   aspirin 325 MG tablet Take by mouth.   augmented betamethasone dipropionate (DIPROLENE-AF) 0.05 % cream APPLY TWICE DAILY AS NEEDED TO THE AFFECTED AREAS FOR 5-7 DAYS FOR ECZEMA.   b complex vitamins capsule Take 1 capsule by mouth daily. 1/4 capsule a day   BELSOMRA 5 MG TABS Take 1 tablet by mouth at bedtime as needed.    Blood Pressure Monitoring (BLOOD PRESSURE CUFF) MISC Take blood pressure  as needed for symptoms.   Boswellia Serrata (BOSWELLIA PO) Take 150 mg by mouth every morning.    Calcium Citrate 200 MG TABS Take 600 mg by mouth.   cromolyn (GASTROCROM) 100 MG/5ML solution Take by mouth 4 (four) times daily -  before meals and at bedtime.   desloratadine (CLARINEX) 5 MG tablet Take 5 mg by mouth 2 (two) times daily. An poss. 1 extra if needed   diphenhydrAMINE (BENADRYL) 12.5 MG/5ML liquid Take by mouth 4 (four) times daily as needed.   diphenhydrAMINE-Allantoin 2-0.5 % CREA Apply 1 application topically as needed.    docusate sodium (COLACE) 100 MG capsule Take 100 mg by mouth every other day.    econazole nitrate 1 % cream APPLY TO FEET TWICE A DAY FOR 2 4 WEEKS AS NEEDED FOR FLARE   EPINEPHrine 0.3 mg/0.3 mL IJ SOAJ injection INJECT ONE AUTO-INJECTOR INTRAMUSCULARLY AS NEEDED   estradiol (ESTRACE) 0.1 MG/GM vaginal cream PLACE 1 APPLICATORFUL      VAGINALLY TWICE A WEEK   famotidine (PEPCID) 10 MG tablet Take 10 mg by mouth daily.    fexofenadine (ALLEGRA) 180  MG tablet Take 180 mg by mouth daily as needed.    guaifenesin (HUMIBID E) 400 MG TABS tablet Take 400 mg by mouth daily.    hydroxychloroquine (PLAQUENIL) 200 MG tablet Take 200 mg by mouth daily.   hydrOXYzine (ATARAX/VISTARIL) 50 MG tablet Take 25 mg by mouth as needed.   ketotifen (ZADITOR) 0.025 % ophthalmic solution Place 1 drop into both eyes daily as needed.    Ketotifen Fumarate POWD Take 1 mg by mouth in the morning and at bedtime. If needed 2 more time a day for total of 4   L-Lysine 500 MG CAPS Take by mouth 2 (two) times daily.   L-Theanine 100 MG CAPS Take 200 mg by mouth daily.    levocetirizine (XYZAL) 5 MG tablet Take 1 tablet (5 mg total) by mouth 2 (two) times daily. (Patient taking differently: Take 5 mg by mouth daily. And some times 1 more extra)   Light Mineral Oil-Mineral Oil 0.5-0.5 % EMUL Apply 1 application to eye daily.    lipase/protease/amylase (CREON) 12000-38000  units CPEP capsule Take 12,000 Units by mouth as directed.   loperamide (IMODIUM) 2 MG capsule Take by mouth as needed for diarrhea or loose stools.   Magnesium Citrate POWD Take 435 mg by mouth daily.    Melatonin 1 MG/ML LIQD Take by mouth daily.    montelukast (SINGULAIR) 10 MG tablet Take 10 mg by mouth daily.    Nerve Stimulator (CEFALY KIT) DEVI    Non Gelatin Capsules, Empty, (CAPSULE #3 CLEAR/CLEAR VEG) CAPS Take 1 capsule by mouth daily.   omega-3 acid ethyl esters (LOVAZA) 1 g capsule Take 2 capsules (2 g total) by mouth 2 (two) times daily.   OTEZLA 30 MG TABS Take 1 tablet by mouth 2 (two) times daily.   polyethylene glycol (MIRALAX / GLYCOLAX) packet Take 17 g by mouth daily as needed.    pseudoephedrine (SUDAFED) 30 MG tablet Take 30 mg by mouth every 4 (four) hours as needed for congestion.    RESTASIS 0.05 % ophthalmic emulsion INSTILL 1 DROP INTO BOTH EYES TWICE A DAY   Riboflavin (VITAMIN B-2 PO) Take by mouth.   Rimegepant Sulfate (NURTEC) 75 MG TBDP Take 1 tablet by mouth as needed.   simethicone (MYLICON) 80 MG chewable tablet Chew 80 mg by mouth every 6 (six) hours as needed for flatulence.   Sodium Fluoride 1.1 % PSTE Place onto teeth.   TRIAMCINOLONE ACETONIDE,NASAL, NA Place into the nose as needed.    UBRELVY 50 MG TABS TAKE 1 TABLET BY MOUTH ONCE AS NEEDED FOR UP TO 1 DOSE   Vitamin D, Cholecalciferol, 10 MCG (400 UNIT) TABS Take by mouth daily.    No facility-administered medications prior to visit.    Review of Systems  Constitutional: Positive for unexpected weight change. Negative for appetite change, fatigue and fever.  Respiratory: Negative for cough, shortness of breath and wheezing.   Cardiovascular: Positive for leg swelling. Negative for chest pain.  Musculoskeletal: Positive for arthralgias, joint swelling and myalgias.    Last CBC Lab Results  Component Value Date   WBC 6.6 08/28/2020   HGB 12.3 08/28/2020   HCT 38.4  08/28/2020   MCV 91.6 08/28/2020   MCH 29.4 08/28/2020   RDW 12.5 08/28/2020   PLT 294 01/75/1025   Last metabolic panel Lab Results  Component Value Date   GLUCOSE 85 08/28/2020   NA 139 08/28/2020   K 4.0 08/28/2020   CL 101 08/28/2020  CO2 28 08/28/2020   BUN 11 08/28/2020   CREATININE 0.48 08/28/2020   GFRNONAA >60 08/28/2020   GFRAA 119 06/12/2020   CALCIUM 9.9 08/28/2020   PROT 7.3 08/28/2020   ALBUMIN 4.4 08/28/2020   LABGLOB 2.4 06/12/2020   AGRATIO 2.0 06/12/2020   BILITOT 0.6 08/28/2020   ALKPHOS 78 08/28/2020   AST 24 08/28/2020   ALT 21 08/28/2020   ANIONGAP 10 08/28/2020   Last lipids Lab Results  Component Value Date   CHOL 208 (H) 06/12/2020   HDL 80 06/12/2020   LDLCALC 109 (H) 06/12/2020   TRIG 111 06/12/2020   CHOLHDL 2.6 06/12/2020   Last thyroid functions Lab Results  Component Value Date   TSH 1.600 06/12/2020   Last vitamin D Lab Results  Component Value Date   VD25OH 32.8 06/12/2020   Last vitamin B12 and Folate Lab Results  Component Value Date   VITAMINB12 1,054 (H) 08/28/2020   FOLATE 25.0 09/02/2018        Objective    BP 100/68 (BP Location: Left Arm, Patient Position: Sitting, Cuff Size: Normal)    Pulse 82    Temp 98.7 F (37.1 C) (Oral)    Resp 16    Ht 5\' 2"  (1.575 m)    Wt 144 lb (65.3 kg)    SpO2 99%    BMI 26.34 kg/m  BP Readings from Last 3 Encounters:  11/09/20 100/68  08/02/20 110/70  06/12/20 (!) 108/59   Wt Readings from Last 3 Encounters:  11/09/20 144 lb (65.3 kg)  08/02/20 143 lb (64.9 kg)  06/12/20 141 lb (64 kg)      Physical Exam Vitals reviewed.  Constitutional:      General: She is not in acute distress.    Appearance: She is well-developed.  HENT:     Head: Normocephalic and atraumatic.  Eyes:     General: No scleral icterus.    Conjunctiva/sclera: Conjunctivae normal.  Cardiovascular:     Rate and Rhythm: Normal rate and regular rhythm.  Pulmonary:     Effort: Pulmonary effort is  normal. No respiratory distress.  Skin:    General: Skin is warm and dry.     Findings: No rash.  Neurological:     Mental Status: She is alert and oriented to person, place, and time.  Psychiatric:        Mood and Affect: Mood normal.        Behavior: Behavior normal.      No results found for any visits on 11/09/20.  Assessment & Plan     Problem List Items Addressed This Visit      Musculoskeletal and Integument   Psoriatic arthritis (HCC) - Primary    Long discussion today regarding treatment for psoriatic arthritis Reassured patient that rheumatology is going through the typical steps for treatment of this condition Discussed that it often takes several trials of medications to find the right 1 to control her condition The stiffness that she exhibits in the morning does seem to be related to uncontrolled psoriatic arthritis For the meantime, she will stay on Plaquenil and 11/11/20 She has had recent lab monitoring for this and does not need additional lab work today She will continue to follow-up with her [facility] rheumatologist until her upcoming appointment to establish with [facility] rheumatology She does have concerns about taking methotrexate while taking Plaquenil, but also at all given that 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      Total time spent on today's visit was greater than 30 minutes, including both face-to-face time and nonface-to-face time personally spent on review of chart (labs and imaging), discussing labs and goals, discussing further work-up, treatment options, referrals to specialist if needed, reviewing outside records of pertinent,  answering patient's questions, and coordinating care.    I, [name], MD, have reviewed all documentation for this visit. The documentation on 11/09/20 for the exam, diagnosis, procedures, and orders are all accurate and complete.   Livie Vanderhoof, Dionne Bucy, MD, MPH [facility]

## 2020-11-12 ENCOUNTER — Telehealth: Payer: Self-pay | Admitting: Oncology

## 2020-11-12 NOTE — Telephone Encounter (Signed)
Left VM with patient to request a r/s of her appts on 3/7 and 3/10.

## 2020-11-14 NOTE — Telephone Encounter (Signed)
Received Rx RF request from [facility] [place] for estradiol. Called pt to ask if she needed RF, pt says she has most of the tube still. She just likes to have a backup. She will let us know when she needs a RF.

## 2020-11-19 ENCOUNTER — Telehealth: Payer: Self-pay | Admitting: *Deleted

## 2020-11-19 ENCOUNTER — Other Ambulatory Visit: Payer: No Typology Code available for payment source

## 2020-11-19 NOTE — Telephone Encounter (Signed)
I'm calling to let you know that your x-ray disk is ready to be picked up.  "I don't think I'll be able to pick it up today.  I will stop by tomorrow.  Thank you so much for letting me know.  Will I pick it up at the front desk?"  Yes, that is correct and it will cost you $5.00.  "Okay, I will sign the paperwork and pick it up on tomorrow."

## 2020-11-22 ENCOUNTER — Telehealth: Payer: No Typology Code available for payment source | Admitting: Oncology

## 2020-12-05 ENCOUNTER — Other Ambulatory Visit: Payer: Self-pay

## 2020-12-05 ENCOUNTER — Inpatient Hospital Stay: Payer: Medicare Other | Attending: Oncology

## 2020-12-05 DIAGNOSIS — Z85828 Personal history of other malignant neoplasm of skin: Secondary | ICD-10-CM | POA: Diagnosis not present

## 2020-12-05 DIAGNOSIS — D509 Iron deficiency anemia, unspecified: Secondary | ICD-10-CM | POA: Diagnosis not present

## 2020-12-05 DIAGNOSIS — Z800 Family history of malignant neoplasm of digestive organs: Secondary | ICD-10-CM | POA: Diagnosis not present

## 2020-12-05 DIAGNOSIS — E538 Deficiency of other specified B group vitamins: Secondary | ICD-10-CM | POA: Diagnosis not present

## 2020-12-05 LAB — FERRITIN: Ferritin: 98 ng/mL (ref 11–307)

## 2020-12-05 LAB — COMPREHENSIVE METABOLIC PANEL
ALT: 21 U/L (ref 0–44)
AST: 24 U/L (ref 15–41)
Albumin: 4.7 g/dL (ref 3.5–5.0)
Alkaline Phosphatase: 67 U/L (ref 38–126)
Anion gap: 8 (ref 5–15)
BUN: 16 mg/dL (ref 6–20)
CO2: 29 mmol/L (ref 22–32)
Calcium: 9.8 mg/dL (ref 8.9–10.3)
Chloride: 101 mmol/L (ref 98–111)
Creatinine, Ser: 0.57 mg/dL (ref 0.44–1.00)
GFR, Estimated: >60 mL/min (ref >60)
Glucose, Bld: 150 mg/dL — ABNORMAL HIGH (ref 70–99)
Potassium: 4.4 mmol/L (ref 3.5–5.1)
Sodium: 138 mmol/L (ref 135–145)
Total Bilirubin: 0.7 mg/dL (ref 0.3–1.2)
Total Protein: 7.7 g/dL (ref 6.5–8.1)

## 2020-12-05 LAB — IRON AND TIBC
Iron: 42 ug/dL (ref 28–170)
Saturation Ratios: 11 % (ref 10.4–31.8)
TIBC: 402 ug/dL (ref 250–450)
UIBC: 360 ug/dL

## 2020-12-05 LAB — CBC WITH DIFFERENTIAL/PLATELET
Abs Immature Granulocytes: 0.04 10*3/uL (ref 0.00–0.07)
Basophils Absolute: 0 10*3/uL (ref 0.0–0.1)
Basophils Relative: 0 %
Eosinophils Absolute: 0 10*3/uL (ref 0.0–0.5)
Eosinophils Relative: 0 %
HCT: 39.9 % (ref 36.0–46.0)
Hemoglobin: 12.9 g/dL (ref 12.0–15.0)
Immature Granulocytes: 0 %
Lymphocytes Relative: 7 %
Lymphs Abs: 0.7 10*3/uL (ref 0.7–4.0)
MCH: 29.5 pg (ref 26.0–34.0)
MCHC: 32.3 g/dL (ref 30.0–36.0)
MCV: 91.3 fL (ref 80.0–100.0)
Monocytes Absolute: 0.2 10*3/uL (ref 0.1–1.0)
Monocytes Relative: 2 %
Neutro Abs: 8.9 10*3/uL — ABNORMAL HIGH (ref 1.7–7.7)
Neutrophils Relative %: 91 %
Platelets: 318 10*3/uL (ref 150–400)
RBC: 4.37 MIL/uL (ref 3.87–5.11)
RDW: 12.5 % (ref 11.5–15.5)
WBC: 9.8 10*3/uL (ref 4.0–10.5)
nRBC: 0 % (ref 0.0–0.2)

## 2020-12-10 ENCOUNTER — Ambulatory Visit: Payer: Medicare Other | Admitting: Family Medicine

## 2020-12-10 ENCOUNTER — Inpatient Hospital Stay (HOSPITAL_BASED_OUTPATIENT_CLINIC_OR_DEPARTMENT_OTHER): Payer: Medicare Other | Admitting: Oncology

## 2020-12-10 DIAGNOSIS — D509 Iron deficiency anemia, unspecified: Secondary | ICD-10-CM

## 2020-12-10 DIAGNOSIS — D531 Other megaloblastic anemias, not elsewhere classified: Secondary | ICD-10-CM

## 2020-12-10 NOTE — Progress Notes (Signed)
I connected with Autumn Zhang on 12/10/20 at  2:15 PM EDT by video enabled telemedicine visit and verified that I am speaking with the correct person using two identifiers.   I discussed the limitations, risks, security and privacy concerns of performing an evaluation and management service by telemedicine and the availability of in-person appointments. I also discussed with the patient that there may be a patient responsible charge related to this service. The patient expressed understanding and agreed to proceed.  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 She underwent right hemicolectomy which revealed poorly differentiated invasive adenocarcinoma T3N0. 0 out of 34 lymph nodes were positive. She received 12 cycles of adjuvant chemotherapy with 5-FU leucovorin. No oxaliplatin. 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 Her only brother has also had colon polyps. She has therefore been managed as a Lynch syndrome patient despite not having genetic features suggestive of Lynch syndrome. She took prophylactic aspirin for a while and then she stopped taking it. She has been getting colonoscopies every 1 to 2 years. She has also had capsule endoscopy in 2014 for iron deficiency anemia that was normal. She has not required any surveillance scans as she has been more than 5 years since her diagnosis of colon cancer. She has established care with Dr. Marius Ditch for this.  Other than that patient has a history of basal cell skin cancer x2 and is in the process of finding a dermatologist. She was also getting yearly pelvic exams  and pelvic ultrasound to screen for GYN cancers. Patient has a history of mast cell activation syndrome and often needs Benadryl before taking oral iron. Also reports that she has dysautonomia and pots for which she sees cardiology.Also has a h/o Ehler Danlos syndrome  Currently patient has an ongoing migraine attack. She is wearing dark glasses and a cat and unable to see eye to eye. She denies any blood in her stool or urine presently. Denies any dark melanotic stools. Her last IV iron was about 6 months ago and she does not remember what type of IV iron she has received.  She sees Dr. Esperanza Sheets from [facility] for mast cell activation syndrome   Interval history patient sprained her neck during her recent car journey.  She has a chronic neck brace in place.  She is planning to move to [facility] soon and will be shifting her medical care there.  She will also be seeing a new dysautonomia specialist   Review of Systems  Constitutional: Positive for malaise/fatigue. Negative for chills, fever and weight loss.  HENT: Negative for congestion, ear discharge and nosebleeds.   Eyes: Negative for blurred vision.  Respiratory: Negative for cough, hemoptysis, sputum production, shortness of breath and wheezing.   Cardiovascular: Negative for chest pain, palpitations, orthopnea and claudication.  Gastrointestinal: Negative for abdominal pain, blood in stool, constipation, diarrhea, heartburn, melena, nausea and vomiting.  Genitourinary: Negative for dysuria, flank pain, frequency, hematuria and urgency.  Musculoskeletal: Positive for neck pain. Negative for back pain, joint pain and myalgias.  Skin: Negative for rash.  Neurological: Negative for dizziness, tingling, focal weakness, seizures, weakness and headaches.  Endo/Heme/Allergies: Does not bruise/bleed easily.  Psychiatric/Behavioral: Negative for depression and suicidal ideas. The patient does not have insomnia.     Allergies  Allergen  Reactions  . Ioversol Anxiety, Hives, Itching and Palpitations  . Ciprofloxacin Other (See Comments)  . Eggs Or Egg-Derived Products Other (See Comments)    Exacerbates neuralgia symptoms Exacerbates neuralgia symptoms Congestion & Headaches Exacerbates neuralgia symptoms Congestion & Headaches   . Ginger Other (See Comments)    Congestion & Headaches Congestion & Headaches   . Levofloxacin Other (See Comments)    Congestion & Headaches Congestion & Headaches   . Other Other (See Comments)    Novocaine (IV) form Hearing loss Novocaine (IV) form Hearing loss  Congestion & Headaches Congestion & Headaches Congestion & Headaches Congestion & Headaches  Other reaction(s): Unknown  . Soybean-Containing Drug Products Other (See Comments)    Congestion & Headaches   . Bee Pollen   . Bupropion   . Celebrex [Celecoxib]   . Codeine   . Dexilant [Dexlansoprazole]   . Dulera [Mometasone Furo-Formoterol Fum]   . Fentanyl   . Fluticasone   . Gabapentin   . Gluten Meal   . Iodine   . Lidocaine     Rash to topical cream  . Lyrica [Pregabalin]   . Mold Extract [Trichophyton]   . Morphine And Related   . Penicillins     Skin testing positive for allergy  . Pollen Extract   . Prilosec [Omeprazole]   . Rizatriptan Other (See Comments)    Dizziness, headache, myalgias, nausea  . Salmeterol Xinafoate   . Silenor [Doxepin Hcl]   . Stevioside Other (See Comments)  . Tomato   . Xylitol Other (See Comments)  . Ethanol Dermatitis, Other (See Comments), Itching and Palpitations  . Hydrocodone Nausea Only  . Ibuprofen Nausea Only    At high doses At high doses   . Lac Bovis Diarrhea    Facial burning sensation Facial burning sensation Facial burning sensation Facial burning sensation   . Lanolin Other (See Comments)    Exacerbates neuralgia symptoms Wool causes hives.   . Milk-Related Compounds Diarrhea    Facial burning sensation   . Wheat Bran Diarrhea    Past  Medical History:  Diagnosis Date  . Allergy   . Anemia   . Anxiety   . Arthritis   . Asthma   . Attention deficit hyperactivity disorder (ADHD), combined type 10/18/2015   Diagnosed many years ago.  Has been doing well on Ritalin 37m QID to six times a day. Long acting hasn't worked well in the past and she has not wanted to try Adderall. She is taking wellbutrin as well, but she has been weaning medication due to SE migraine.  She is now down to Wellbutrin one quarter tablet daily.  Last Assessment & Plan:  A/P: Stable.  Continue current dosing.  Refilled 3 months  . Basal cell carcinoma   . BRCA negative 09/2018   [facility]; 1/20 MyRisk neg except RPS20 VUS; IBIS=10.8%/riskscore=12%  . [place] cancer ([place]   . [place]onomia ([place]   . Ehlers-Danlos syndrome type III   . Family history of colon cancer   . GERD (gastroesophageal reflux disease)   . Hyperlipidemia   . Idiopathic small fiber peripheral neuropathy   . Leiomyoma   . [place]ast cell activation [place]   . Migraines   . Neck pain   . Occipital neuralgia   . Sleep apnea  Past Surgical History:  Procedure Laterality Date  . APPENDECTOMY     removed with colectomy  . BREAST SURGERY     reduction  . COLON SURGERY  2008   for colon cancer, ~1/3 of colon removed, primary reanastimosis  . COLONOSCOPY WITH PROPOFOL N/A 10/07/2018   Procedure: COLONOSCOPY WITH PROPOFOL;  Surgeon: Lin Landsman, MD;  Location: [facility] ENDOSCOPY;  Service: Gastroenterology;  Laterality: N/A;  . ENDOMETRIAL ABLATION      Social History   Socioeconomic History  . Marital status: Married    Spouse name: Not on file  . Number of children: 0  . Years of education: Not on file  . Highest education level: Not on file  Occupational History  . Occupation: SSI and disability  Tobacco Use  . Smoking status: Never Smoker  . Smokeless tobacco: Never Used  Vaping Use  . Vaping Use: Never used  Substance and Sexual Activity  .  Alcohol use: Never  . Drug use: Never  . Sexual activity: Not Currently    Birth control/protection: None  Other Topics Concern  . Not on file  Social History Narrative  . Not on file   Social Determinants of Health   Financial Resource Strain: Not on file  Food Insecurity: Not on file  Transportation Needs: Not on file  Physical Activity: Not on file  Stress: Not on file  Social Connections: Not on file  Intimate Partner Violence: Not on file    Family History  Problem Relation Age of Onset  . Colon cancer Father 6  . Breast cancer Maternal Aunt 50       BRCA neg  . Endometrial cancer Maternal Aunt 50  . Colon cancer Maternal Grandmother 72  . Migraines Maternal Grandmother   . Tremor Maternal Grandmother   . Leukemia Maternal Grandfather 13  . Food Allergy Maternal Grandfather   . Migraines Mother   . Glaucoma Mother   . Cataracts Mother   . Tremor Mother   . Fibromyalgia Mother   . Heart disease Mother   . Colon polyps Brother        non-cancerous     Current Outpatient Medications:  .  acetaminophen (TYLENOL) 500 MG tablet, Take 500 mg by mouth every 6 (six) hours as needed., Disp: , Rfl:  .  albuterol (PROVENTIL HFA;VENTOLIN HFA) 108 (90 Base) MCG/ACT inhaler, Inhale 2 puffs into the lungs every 4 (four) hours as needed. , Disp: , Rfl:  .  Ascorbic Acid (VITAMIN C) 1000 MG tablet, Take 1,000 mg by mouth daily. probably total is 900 mg, Disp: , Rfl:  .  aspirin 325 MG tablet, Take by mouth., Disp: , Rfl:  .  augmented betamethasone dipropionate (DIPROLENE-AF) 0.05 % cream, APPLY TWICE DAILY AS NEEDED TO THE AFFECTED AREAS FOR 5-7 DAYS FOR ECZEMA., Disp: , Rfl:  .  b complex vitamins capsule, Take 1 capsule by mouth daily. 1/4 capsule a day, Disp: , Rfl:  .  BELSOMRA 5 MG TABS, Take 1 tablet by mouth at bedtime as needed. , Disp: , Rfl:  .  Blood Pressure Monitoring (BLOOD PRESSURE CUFF) MISC, Take blood pressure as needed for symptoms., Disp: , Rfl:  .   Boswellia Serrata (BOSWELLIA PO), Take 150 mg by mouth every morning. , Disp: , Rfl:  .  cromolyn (GASTROCROM) 100 MG/5ML solution, Take by mouth 4 (four) times daily -  before meals and at bedtime., Disp: , Rfl:  .  desloratadine (CLARINEX) 5 MG tablet, Take  5 mg by mouth 2 (two) times daily. An poss. 1 extra if needed, Disp: , Rfl:  .  diphenhydrAMINE (BENADRYL) 12.5 MG/5ML liquid, Take by mouth 4 (four) times daily as needed., Disp: , Rfl:  .  diphenhydrAMINE-Allantoin 2-0.5 % CREA, Apply 1 application topically as needed. , Disp: , Rfl:  .  docusate sodium (COLACE) 100 MG capsule, Take 100 mg by mouth every other day. , Disp: , Rfl:  .  econazole nitrate 1 % cream, APPLY TO FEET TWICE A DAY FOR 2 4 WEEKS AS NEEDED FOR FLARE, Disp: , Rfl:  .  EPINEPHrine 0.3 mg/0.3 mL IJ SOAJ injection, INJECT ONE AUTO-INJECTOR INTRAMUSCULARLY AS NEEDED, Disp: , Rfl:  .  estradiol (ESTRACE) 0.1 MG/GM vaginal cream, PLACE 1 APPLICATORFUL      VAGINALLY TWICE A WEEK, Disp: 42.5 g, Rfl: 0 .  famotidine (PEPCID) 10 MG tablet, Take 10 mg by mouth daily. , Disp: , Rfl:  .  fexofenadine (ALLEGRA) 180 MG tablet, Take 180 mg by mouth daily as needed. , Disp: , Rfl:  .  guaifenesin (HUMIBID E) 400 MG TABS tablet, Take 400 mg by mouth daily. , Disp: , Rfl:  .  Ketotifen Fumarate POWD, Take 1 mg by mouth in the morning and at bedtime. If needed 2 more time a day for total of 4, Disp: , Rfl:  .  L-Lysine 500 MG CAPS, Take by mouth 2 (two) times daily., Disp: , Rfl:  .  L-Theanine 100 MG CAPS, Take 200 mg by mouth daily. , Disp: , Rfl:  .  lipase/protease/amylase (CREON) 12000-38000 units CPEP capsule, Take 12,000 Units by mouth as directed., Disp: , Rfl:  .  Melatonin 1 MG/ML LIQD, Take by mouth daily. , Disp: , Rfl:  .  montelukast (SINGULAIR) 10 MG tablet, Take 10 mg by mouth daily. , Disp: , Rfl:  .  Nerve Stimulator (CEFALY KIT) DEVI, , Disp: , Rfl:  .  omega-3 acid ethyl esters (LOVAZA) 1 g capsule, Take 2 capsules (2  g total) by mouth 2 (two) times daily., Disp: 360 capsule, Rfl: 3 .  OTEZLA 30 MG TABS, Take 1 tablet by mouth 2 (two) times daily., Disp: , Rfl:  .  polyethylene glycol (MIRALAX / GLYCOLAX) packet, Take 17 g by mouth daily as needed. , Disp: , Rfl:  .  pseudoephedrine (SUDAFED) 30 MG tablet, Take 30 mg by mouth every 4 (four) hours as needed for congestion. , Disp: , Rfl:  .  RESTASIS 0.05 % ophthalmic emulsion, INSTILL 1 DROP INTO BOTH EYES TWICE A DAY, Disp: 180 mL, Rfl: 1 .  Riboflavin (VITAMIN B-2 PO), Take by mouth., Disp: , Rfl:  .  Rimegepant Sulfate (NURTEC) 75 MG TBDP, Take 1 tablet by mouth as needed., Disp: , Rfl:  .  simethicone (MYLICON) 80 MG chewable tablet, Chew 80 mg by mouth every 6 (six) hours as needed for flatulence., Disp: , Rfl:  .  Sodium Fluoride 1.1 % PSTE, Place onto teeth., Disp: , Rfl:  .  UBRELVY 50 MG TABS, TAKE 1 TABLET BY MOUTH ONCE AS NEEDED FOR UP TO 1 DOSE, Disp: , Rfl:  .  Vitamin D, Cholecalciferol, 10 MCG (400 UNIT) TABS, Take by mouth daily. , Disp: , Rfl:  .  Calcium Citrate 200 MG TABS, Take 600 mg by mouth. (Patient not taking: Reported on 12/10/2020), Disp: , Rfl:  .  hydrOXYzine (ATARAX/VISTARIL) 50 MG tablet, Take 25 mg by mouth as needed. (Patient not taking: Reported on  12/10/2020), Disp: , Rfl:  .  ketotifen (ZADITOR) 0.025 % ophthalmic solution, Place 1 drop into both eyes daily as needed.  (Patient not taking: Reported on 12/10/2020), Disp: , Rfl:  .  levocetirizine (XYZAL) 5 MG tablet, Take 1 tablet (5 mg total) by mouth 2 (two) times daily. (Patient not taking: Reported on 12/10/2020), Disp: 180 tablet, Rfl: 3 .  Light Mineral Oil-Mineral Oil 0.5-0.5 % EMUL, Apply 1 application to eye daily.  (Patient not taking: Reported on 12/10/2020), Disp: , Rfl:  .  loperamide (IMODIUM) 2 MG capsule, Take by mouth as needed for diarrhea or loose stools. (Patient not taking: Reported on 12/10/2020), Disp: , Rfl:  .  Magnesium Citrate POWD, Take 435 mg by mouth  daily.  (Patient not taking: Reported on 12/10/2020), Disp: , Rfl:  .  Non Gelatin Capsules, Empty, (CAPSULE #3 CLEAR/CLEAR VEG) CAPS, Take 1 capsule by mouth daily. (Patient not taking: Reported on 12/10/2020), Disp: , Rfl:  .  TRIAMCINOLONE ACETONIDE,NASAL, NA, Place into the nose as needed.  (Patient not taking: Reported on 12/10/2020), Disp: , Rfl:   No results found.  No images are attached to the encounter.   CMP Latest Ref Rng & Units 12/05/2020  Glucose 70 - 99 mg/dL 150(H)  BUN 6 - 20 mg/dL 16  Creatinine 0.44 - 1.00 mg/dL 0.57  Sodium 135 - 145 mmol/L 138  Potassium 3.5 - 5.1 mmol/L 4.4  Chloride 98 - 111 mmol/L 101  CO2 22 - 32 mmol/L 29  Calcium 8.9 - 10.3 mg/dL 9.8  Total Protein 6.5 - 8.1 g/dL 7.7  Total Bilirubin 0.3 - 1.2 mg/dL 0.7  Alkaline Phos 38 - 126 U/L 67  AST 15 - 41 U/L 24  ALT 0 - 44 U/L 21   CBC Latest Ref Rng & Units 12/05/2020  WBC 4.0 - 10.5 K/uL 9.8  Hemoglobin 12.0 - 15.0 g/dL 12.9  Hematocrit 36.0 - 46.0 % 39.9  Platelets 150 - 400 K/uL 318     Observation/objective: Appears in no acute distress over video visit today.  Breathing is nonlabored  Assessment and plan: Patient is a 50 year old female with history of iron deficiency anemia and this is a routine follow-up visit  Patient is not presently anemic and her iron studies are normal.  She does not require IV iron at this time.  She last received IV iron about 2 years ago.  Patient plans to move to [facility] and I recommended that she should continue to follow-up with her primary care provider at this time.  If she has evidence of iron deficiency anemia in the future she can be referred to hematology if need be.  Patient did not have any genetic evidence of Lynch syndrome but given her young age of diagnosis of colon cancer she was treated as such and being managed and undergoing surveillance as per Lynch's guidelines.  Her colon cancer has been more than 5 years ago and she does not require any  oncology follow-up for this.  She will continue to follow-up with GYN, GI as well as dermatology  Follow-up instructions: I will check her labs CBC ferritin and iron studies once in 3 months before she moves to [facility].  Patient knows to reach out to [place] if she has any questions or concerns  I discussed the assessment and treatment plan with the patient. The patient was provided an opportunity to ask questions and all were answered. The patient agreed with the plan and demonstrated an  understanding of the instructions.   The patient was advised to call back or seek an in-person evaluation if the symptoms worsen or if the condition fails to improve as anticipated.  Visit Diagnosis: 1. Iron deficiency anemia, unspecified iron deficiency anemia type   2. Vitamin B12 deficient megaloblastic anemia     Dr. Randa Evens, MD, MPH [facility] at [facility] Tel- [phone] 12/10/2020 9:47 PM

## 2020-12-13 ENCOUNTER — Ambulatory Visit: Payer: Medicare Other | Admitting: Family Medicine

## 2020-12-17 ENCOUNTER — Ambulatory Visit: Payer: Medicare Other | Admitting: Family Medicine

## 2020-12-28 ENCOUNTER — Encounter: Payer: Self-pay | Admitting: Family Medicine

## 2020-12-31 ENCOUNTER — Encounter: Payer: Self-pay | Admitting: Obstetrics and Gynecology

## 2021-01-01 ENCOUNTER — Other Ambulatory Visit: Payer: Self-pay | Admitting: Obstetrics and Gynecology

## 2021-01-01 DIAGNOSIS — N898 Other specified noninflammatory disorders of vagina: Secondary | ICD-10-CM

## 2021-01-01 MED ORDER — ESTRADIOL 0.1 MG/GM VA CREA: TOPICAL_CREAM | VAGINAL | 1 refills | Status: DC

## 2021-01-01 NOTE — Progress Notes (Signed)
Rx RF estradiol till annual due 11/22

## 2021-01-02 ENCOUNTER — Encounter: Payer: Self-pay | Admitting: Family Medicine

## 2021-01-14 NOTE — Telephone Encounter (Signed)
Signed. Please let patient know when it is faxed. Thanks!

## 2021-01-14 NOTE — Telephone Encounter (Signed)
Please write out Rx and I'll sign. For diagnosis, I think she has an edema diagnosis on problem list. Thanks

## 2021-01-17 ENCOUNTER — Telehealth: Payer: Self-pay

## 2021-01-17 NOTE — Telephone Encounter (Signed)
Copied from [place] [id number]. Topic: General - Other >> Jan 16, 2021 10:16 AM Leward Quan A wrote: Reason for CRM: Josh with [facility] called in for Dr B say they need patient demographics and also the diagnosis so that they can bill and contact patient for shipping and other info Ph# [phone] and Fax# [phone]

## 2021-01-21 NOTE — Telephone Encounter (Signed)
Diagnoses are Ehlers-Danlos syndrome, dysautonomia, edema

## 2021-01-21 NOTE — Telephone Encounter (Signed)
Please advise on diagnosis 

## 2021-01-21 NOTE — Telephone Encounter (Signed)
Faxed demographic and diagnosis.

## 2021-02-06 ENCOUNTER — Encounter: Payer: Self-pay | Admitting: Oncology

## 2021-03-07 ENCOUNTER — Other Ambulatory Visit: Payer: Self-pay

## 2021-03-07 ENCOUNTER — Ambulatory Visit (INDEPENDENT_AMBULATORY_CARE_PROVIDER_SITE_OTHER): Payer: Medicare Other | Admitting: Family Medicine

## 2021-03-07 ENCOUNTER — Encounter: Payer: Self-pay | Admitting: Family Medicine

## 2021-03-07 VITALS — BP 104/72 | HR 82 | Temp 98.0°F | Resp 16 | Wt 147.6 lb

## 2021-03-07 DIAGNOSIS — G901 Familial dysautonomia [Riley-Day]: Secondary | ICD-10-CM

## 2021-03-07 DIAGNOSIS — D509 Iron deficiency anemia, unspecified: Secondary | ICD-10-CM

## 2021-03-07 DIAGNOSIS — Z1231 Encounter for screening mammogram for malignant neoplasm of breast: Secondary | ICD-10-CM

## 2021-03-07 DIAGNOSIS — L4050 Arthropathic psoriasis, unspecified: Secondary | ICD-10-CM

## 2021-03-07 DIAGNOSIS — D8940 Mast cell activation, unspecified: Secondary | ICD-10-CM

## 2021-03-07 NOTE — Progress Notes (Signed)
Established patient visit   Patient: Autumn Zhang   DOB: Jul 19, 1971   50 y.o. Female  MRN: [id number] Visit Date: 03/07/2021  Today's healthcare provider: [name], MD   Chief Complaint  Patient presents with   Transitions Of Care    Patient presents in office because she will be moving to [place] [facility] and will be establishing with a new PCP. Today patient would like to discuss health maintenance and to discuss transition.   Subjective    HPI HPI     Transitions Of Care    Additional comments: Patient presents in office because she will be moving to [facility] and will be establishing with a new PCP. Today patient would like to discuss health maintenance and to discuss transition.      Last edited by Minette Headland, CMA on 03/07/2021  8:18 AM.     Transition of Care  She is transferring to [place] with [facility] in [place] due to her upcoming move.   Cosentyx  She is concerned because she has her next dosage of cosentyx on July 6th and will be exposed for extended period of time. On July 3 she has a visit to juvenile detention and a court date on July 11th.      Medications: Outpatient Medications Prior to Visit  Medication Sig Note   acetaminophen (TYLENOL) 500 MG tablet Take 500 mg by mouth every 6 (six) hours as needed.    albuterol (PROVENTIL HFA;VENTOLIN HFA) 108 (90 Base) MCG/ACT inhaler Inhale 2 puffs into the lungs every 4 (four) hours as needed.     Ascorbic Acid (VITAMIN C) 1000 MG tablet Take 1,000 mg by mouth daily. probably total is 900 mg    aspirin 325 MG tablet Take by mouth.    augmented betamethasone dipropionate (DIPROLENE-AF) 0.05 % cream APPLY TWICE DAILY AS NEEDED TO THE AFFECTED AREAS FOR 5-7 DAYS FOR ECZEMA.    b complex vitamins capsule Take 1 capsule by mouth daily. 1/4 capsule a day    BELSOMRA 5 MG TABS Take 1 tablet by mouth at bedtime as needed.     Blood Pressure Monitoring (BLOOD PRESSURE CUFF) MISC  Take blood pressure as needed for symptoms.    Boswellia Serrata (BOSWELLIA PO) Take 150 mg by mouth every morning.     Calcium Citrate 200 MG TABS Take 600 mg by mouth. 03/07/2021: Patient reports that she takes $RemoveB'400mg'JyrDNBAu$  qd   cromolyn (GASTROCROM) 100 MG/5ML solution Take by mouth 4 (four) times daily -  before meals and at bedtime.    desloratadine (CLARINEX REDITAB) 5 MG disintegrating tablet Take by mouth. 03/07/2021: Take $RemoveBeforeD'5mg'[id number]$  qd BID ORN    diphenhydrAMINE (BENADRYL) 12.5 MG/5ML liquid Take by mouth 4 (four) times daily as needed.    diphenhydrAMINE-Allantoin 2-0.5 % CREA Apply 1 application topically as needed.     docusate sodium (COLACE) 100 MG capsule Take 100 mg by mouth every other day.     econazole nitrate 1 % cream APPLY TO FEET TWICE A DAY FOR 2 4 WEEKS AS NEEDED FOR FLARE    EPINEPHrine 0.3 mg/0.3 mL IJ SOAJ injection INJECT ONE AUTO-INJECTOR INTRAMUSCULARLY AS NEEDED    estradiol (ESTRACE) 0.1 MG/GM vaginal cream Insert 1 g vaginally twice weekly    famotidine (PEPCID) 10 MG tablet Take 10 mg by mouth daily.     fexofenadine (ALLEGRA) 180 MG tablet Take 180 mg by mouth daily as needed.  guaifenesin (HUMIBID E) 400 MG TABS tablet Take 400 mg by mouth daily.  03/07/2021: PRN   hydrOXYzine (ATARAX/VISTARIL) 50 MG tablet Take 25 mg by mouth as needed. 03/07/2021: PRN   ketotifen (ZADITOR) 0.025 % ophthalmic solution Place 1 drop into both eyes daily as needed.    Ketotifen Fumarate POWD Take 1 mg by mouth in the morning and at bedtime. If needed 2 more time a day for total of 4    L-Lysine 500 MG CAPS Take by mouth 2 (two) times daily.    L-Theanine 100 MG CAPS Take 200 mg by mouth daily.     levocetirizine (XYZAL) 5 MG tablet Take 1 tablet (5 mg total) by mouth 2 (two) times daily. 03/07/2021: Patient reports that she is taking liquid form   Light Mineral Oil-Mineral Oil 0.5-0.5 % EMUL Apply 1 application to eye daily.    lipase/protease/amylase (CREON) 12000-38000 units CPEP capsule  Take 12,000 Units by mouth as directed.    loperamide (IMODIUM) 2 MG capsule Take by mouth as needed for diarrhea or loose stools.    Magnesium Citrate POWD Take 435 mg by mouth daily.    Melatonin 1 MG/ML LIQD Take by mouth daily.     montelukast (SINGULAIR) 10 MG tablet Take 10 mg by mouth daily.  03/07/2021: Patient takes $RemoveBefor'5mg'[id number]$  BID   Nerve Stimulator (CEFALY KIT) DEVI     Non Gelatin Capsules, Empty, (CAPSULE #3 CLEAR/CLEAR VEG) CAPS Take 1 capsule by mouth daily.    omega-3 acid ethyl esters (LOVAZA) 1 g capsule Take 2 capsules (2 g total) by mouth 2 (two) times daily.    OTEZLA 30 MG TABS Take 1 tablet by mouth 2 (two) times daily.    polyethylene glycol (MIRALAX / GLYCOLAX) packet Take 17 g by mouth daily as needed.     pseudoephedrine (SUDAFED) 30 MG tablet Take 30 mg by mouth every 4 (four) hours as needed for congestion.     RESTASIS 0.05 % ophthalmic emulsion INSTILL 1 DROP INTO BOTH EYES TWICE A DAY    Riboflavin (VITAMIN B-2 PO) Take by mouth.    Rimegepant Sulfate (NURTEC) 75 MG TBDP Take 1 tablet by mouth as needed.    Secukinumab (COSENTYX [place]) Inject into the skin. Every 28days    simethicone (MYLICON) 80 MG chewable tablet Chew 80 mg by mouth every 6 (six) hours as needed for flatulence.    Sodium Fluoride 1.1 % PSTE Place onto teeth.    TRIAMCINOLONE ACETONIDE,NASAL, NA Place into the nose as needed.    UBRELVY 50 MG TABS TAKE 1 TABLET BY MOUTH ONCE AS NEEDED FOR UP TO 1 DOSE    Vitamin D, Cholecalciferol, 10 MCG (400 UNIT) TABS Take by mouth daily.     [DISCONTINUED] desloratadine (CLARINEX) 5 MG tablet Take 5 mg by mouth 2 (two) times daily. An poss. 1 extra if needed    No facility-administered medications prior to visit.    Review of Systems  Constitutional:  Negative for chills, diaphoresis, fatigue and fever.  HENT:  Negative for ear pain, sinus pressure, sinus pain and sore throat.   Eyes:  Negative for pain.  Respiratory:  Negative for apnea, cough, chest tightness,  shortness of breath and wheezing.   Cardiovascular:  Negative for chest pain, palpitations and leg swelling.  Gastrointestinal:  Negative for abdominal pain, blood in stool, constipation, diarrhea, nausea and vomiting.  Genitourinary:  Negative for dysuria, flank pain, frequency, pelvic pain and urgency.  Musculoskeletal:  Negative for back  pain, myalgias and neck pain.  Neurological:  Negative for dizziness, syncope, weakness, light-headedness, numbness and headaches.      Objective    BP 104/72   Pulse 82   Temp 98 F (36.7 C) (Oral)   Resp 16   Wt 147 lb 9.6 oz (67 kg)   SpO2 98%   BMI 27.00 kg/m     Physical Exam Vitals reviewed.  Constitutional:      General: She is not in acute distress.    Appearance: Normal appearance. She is well-developed. She is not diaphoretic.  HENT:     Head: Normocephalic and atraumatic.  Eyes:     General: No scleral icterus.    Conjunctiva/sclera: Conjunctivae normal.  Neck:     Thyroid: No thyromegaly.  Cardiovascular:     Rate and Rhythm: Normal rate and regular rhythm.     Pulses: Normal pulses.     Heart sounds: Normal heart sounds. No murmur heard. Pulmonary:     Effort: Pulmonary effort is normal. No respiratory distress.     Breath sounds: Normal breath sounds. No wheezing, rhonchi or rales.  Musculoskeletal:     Cervical back: Neck supple.     Right lower leg: No edema.     Left lower leg: No edema.  Lymphadenopathy:     Cervical: No cervical adenopathy.  Skin:    General: Skin is warm and dry.     Findings: No rash.  Neurological:     Mental Status: She is alert and oriented to person, place, and time. Mental status is at baseline.  Psychiatric:        Mood and Affect: Mood normal.        Behavior: Behavior normal.     No results found for any visits on 03/07/21.  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      Total time spent on today's visit was greater than 30 minutes, including both face-to-face time and nonface-to-face time personally spent on review of chart (labs and imaging), discussing labs and goals, discussing further work-up, treatment options, referrals to specialist if needed, reviewing outside records of pertinent, answering patient's questions, and coordinating care.   I,Essence Turner,acting as a Education administrator for [name], MD.,have documented all relevant documentation on the behalf of [name], MD,as directed by  [name],  MD while in the presence of [name], MD.  I, [name], MD, have reviewed all documentation for this visit. The documentation on 03/07/21 for the exam, diagnosis, procedures, and orders are all accurate and complete.   Trisha Morandi, Dionne Bucy, MD, MPH [facility]

## 2021-03-07 NOTE — Assessment & Plan Note (Signed)
Followed by allergy and immunology She is taking H1 and H2 blockers as well as Singulair to suppress histamine response In the event of hospitalization, needs Benadryl administered Has EpiPen available

## 2021-03-07 NOTE — Assessment & Plan Note (Signed)
Recently started Cosentyx and it is going well Discussed immunosuppression - vaccinations, precautions, etc

## 2021-03-07 NOTE — Assessment & Plan Note (Signed)
Upcoming appt with new PCP  Discussed transferring records No need for labs at this time

## 2021-03-07 NOTE — Assessment & Plan Note (Signed)
Upcoming appt with Heme May be able to be followed by PCP in the future

## 2021-03-13 ENCOUNTER — Inpatient Hospital Stay: Payer: Medicare Other | Attending: Oncology

## 2021-03-13 DIAGNOSIS — E538 Deficiency of other specified B group vitamins: Secondary | ICD-10-CM | POA: Insufficient documentation

## 2021-03-13 DIAGNOSIS — D531 Other megaloblastic anemias, not elsewhere classified: Secondary | ICD-10-CM

## 2021-03-13 DIAGNOSIS — D509 Iron deficiency anemia, unspecified: Secondary | ICD-10-CM | POA: Diagnosis not present

## 2021-03-13 LAB — CBC WITH DIFFERENTIAL/PLATELET
Abs Immature Granulocytes: 0.02 10*3/uL (ref 0.00–0.07)
Basophils Absolute: 0.1 10*3/uL (ref 0.0–0.1)
Basophils Relative: 1 %
Eosinophils Absolute: 0.1 10*3/uL (ref 0.0–0.5)
Eosinophils Relative: 1 %
HCT: 38.3 % (ref 36.0–46.0)
Hemoglobin: 12.1 g/dL (ref 12.0–15.0)
Immature Granulocytes: 0 %
Lymphocytes Relative: 25 %
Lymphs Abs: 1.8 10*3/uL (ref 0.7–4.0)
MCH: 29.6 pg (ref 26.0–34.0)
MCHC: 31.6 g/dL (ref 30.0–36.0)
MCV: 93.6 fL (ref 80.0–100.0)
Monocytes Absolute: 0.6 10*3/uL (ref 0.1–1.0)
Monocytes Relative: 8 %
Neutro Abs: 4.9 10*3/uL (ref 1.7–7.7)
Neutrophils Relative %: 65 %
Platelets: 283 10*3/uL (ref 150–400)
RBC: 4.09 MIL/uL (ref 3.87–5.11)
RDW: 12.7 % (ref 11.5–15.5)
WBC: 7.5 10*3/uL (ref 4.0–10.5)
nRBC: 0 % (ref 0.0–0.2)

## 2021-03-13 LAB — IRON AND TIBC
Iron: 54 ug/dL (ref 28–170)
Saturation Ratios: 14 % (ref 10.4–31.8)
TIBC: 385 ug/dL (ref 250–450)
UIBC: 331 ug/dL

## 2021-03-13 LAB — FERRITIN: Ferritin: 70 ng/mL (ref 11–307)

## 2021-03-14 LAB — VITAMIN B12: Vitamin B-12: 573 pg/mL (ref 180–914)

## 2021-03-15 ENCOUNTER — Encounter: Payer: Self-pay | Admitting: Oncology

## 2021-03-18 ENCOUNTER — Other Ambulatory Visit: Payer: Self-pay | Admitting: Family Medicine

## 2021-03-19 NOTE — Telephone Encounter (Signed)
Please review for Dr. B ° ° °Thanks,  ° °-Million Maharaj  °

## 2021-03-19 NOTE — Telephone Encounter (Signed)
Requested medication (s) are due for refill today: yes  Requested medication (s) are on the active medication list: yes  Last refill:12/20/2020  Future visit scheduled:no  Notes to clinic:  This refill cannot be delegated   Requested Prescriptions  Pending Prescriptions Disp Refills   RESTASIS 0.05 % ophthalmic emulsion [Pharmacy Med Name: RESTASIS 0.05% EYE EMULSION] 180 mL 1    Sig: INSTILL 1 DROP INTO BOTH EYES TWICE A DAY      Not Delegated - Immunology:  Immunosuppressive Agents - cyclosporine (Ocular) Failed - 03/18/2021  5:23 PM      Failed - This refill cannot be delegated      Passed - Valid encounter within last 12 months    Recent Outpatient Visits           1 week ago Encounter for screening mammogram for malignant neoplasm of breast   [facility], Dionne Bucy, MD   4 months ago Psoriatic arthritis [facility])   [facility], Dionne Bucy, MD   8 months ago Dysautonomia [facility])   [facility], Dionne Bucy, MD   9 months ago Has daytime drowsiness   [facility], Dionne Bucy, MD   9 months ago Welcome to [employer] preventive visit   [facility], Dionne Bucy, MD

## 2021-04-02 ENCOUNTER — Encounter: Payer: Self-pay | Admitting: Family Medicine

## 2021-04-05 ENCOUNTER — Other Ambulatory Visit: Payer: Self-pay | Admitting: Family Medicine

## 2021-04-05 DIAGNOSIS — R635 Abnormal weight gain: Secondary | ICD-10-CM

## 2021-04-08 LAB — HM MAMMOGRAPHY

## 2021-04-17 ENCOUNTER — Encounter: Payer: Self-pay | Admitting: Oncology

## 2021-04-19 DIAGNOSIS — D849 Immunodeficiency, unspecified: Secondary | ICD-10-CM | POA: Insufficient documentation

## 2021-04-23 ENCOUNTER — Telehealth: Payer: Self-pay | Admitting: *Deleted

## 2021-04-23 NOTE — Telephone Encounter (Signed)
Patient called requesting a response to several messages she has sent to Dr. Janese Banks. Messages are in the chart.

## 2021-04-23 NOTE — Telephone Encounter (Signed)
Spoke to patient. Please make referral to [facility] genetics as mentioned in patient message. Please see if I can speak to her GI at [facility].

## 2021-04-24 ENCOUNTER — Telehealth: Payer: Self-pay

## 2021-04-24 NOTE — Telephone Encounter (Signed)
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 Per Dr. Janese Banks, referral was made and faxed over to [phone], fax went through. Dr. Janese Banks will like to speak to pts [facility] GI provider. When I reached out to office, office was closed. Will reach out tomorrow to relay message.

## 2021-04-25 ENCOUNTER — Encounter: Payer: Self-pay | Admitting: Oncology

## 2021-05-01 ENCOUNTER — Other Ambulatory Visit: Payer: Self-pay | Admitting: *Deleted

## 2021-05-01 ENCOUNTER — Telehealth: Payer: Self-pay | Admitting: *Deleted

## 2021-05-01 DIAGNOSIS — D531 Other megaloblastic anemias, not elsewhere classified: Secondary | ICD-10-CM

## 2021-05-01 DIAGNOSIS — D509 Iron deficiency anemia, unspecified: Secondary | ICD-10-CM

## 2021-05-01 NOTE — Telephone Encounter (Signed)
Called pt and let her know that Dr. Janese Banks wants you to have labs since you are feeling fatigued. She will come tomorrow at 11:30. Orders are in and I also told her that we did refer her to [facility] for genetics consult. We got a paper from [facility] abaout upfront charges and place for pt. Signature. Cristan says she got the same paper so I can throw this away.

## 2021-05-02 ENCOUNTER — Other Ambulatory Visit: Payer: Medicare Other

## 2021-05-03 ENCOUNTER — Encounter: Payer: Self-pay | Admitting: Family Medicine

## 2021-05-06 ENCOUNTER — Encounter: Payer: Self-pay | Admitting: Oncology

## 2021-05-06 ENCOUNTER — Inpatient Hospital Stay: Payer: Medicare Other | Attending: Oncology

## 2021-05-06 DIAGNOSIS — D531 Other megaloblastic anemias, not elsewhere classified: Secondary | ICD-10-CM

## 2021-05-06 DIAGNOSIS — D509 Iron deficiency anemia, unspecified: Secondary | ICD-10-CM

## 2021-05-06 LAB — VITAMIN B12: Vitamin B-12: 589 pg/mL (ref 180–914)

## 2021-05-06 LAB — CBC
HCT: 37.1 % (ref 36.0–46.0)
Hemoglobin: 11.9 g/dL — ABNORMAL LOW (ref 12.0–15.0)
MCH: 30 pg (ref 26.0–34.0)
MCHC: 32.1 g/dL (ref 30.0–36.0)
MCV: 93.5 fL (ref 80.0–100.0)
Platelets: 291 10*3/uL (ref 150–400)
RBC: 3.97 MIL/uL (ref 3.87–5.11)
RDW: 12 % (ref 11.5–15.5)
WBC: 6.4 10*3/uL (ref 4.0–10.5)
nRBC: 0 % (ref 0.0–0.2)

## 2021-05-06 LAB — IRON AND TIBC
Iron: 54 ug/dL (ref 28–170)
Saturation Ratios: 14 % (ref 10.4–31.8)
TIBC: 382 ug/dL (ref 250–450)
UIBC: 328 ug/dL

## 2021-05-06 LAB — FERRITIN: Ferritin: 67 ng/mL (ref 11–307)

## 2021-05-09 ENCOUNTER — Telehealth: Payer: Self-pay | Admitting: *Deleted

## 2021-05-09 NOTE — Telephone Encounter (Signed)
Contacted patient today and let her know that Dr. Janese Banks says She is a little more anemic as compared to baseline. Ferritin is >50 but iron satration <20. We could consider IV iron if she is symptomatic .  Patient states that she has been feeling very fatigued and she would be glad to come in and get iron infusions.  I told patient that I will send the message to the scheduler and she will get her set up for iron infusions.  Patient says anytime she can come is fine with her.

## 2021-05-10 ENCOUNTER — Other Ambulatory Visit: Payer: Self-pay | Admitting: Oncology

## 2021-05-13 ENCOUNTER — Encounter: Payer: Self-pay | Admitting: *Deleted

## 2021-05-14 ENCOUNTER — Other Ambulatory Visit: Payer: Self-pay | Admitting: Oncology

## 2021-05-14 ENCOUNTER — Encounter: Payer: Self-pay | Admitting: Oncology

## 2021-05-14 ENCOUNTER — Inpatient Hospital Stay: Payer: Medicare Other

## 2021-05-14 VITALS — BP 97/62 | HR 70 | Temp 98.0°F | Resp 18

## 2021-05-14 DIAGNOSIS — D509 Iron deficiency anemia, unspecified: Secondary | ICD-10-CM | POA: Diagnosis not present

## 2021-05-14 DIAGNOSIS — Z862 Personal history of diseases of the blood and blood-forming organs and certain disorders involving the immune mechanism: Secondary | ICD-10-CM

## 2021-05-14 MED ORDER — DIPHENHYDRAMINE HCL 50 MG/ML IJ SOLN
25.0000 mg | Freq: Once | INTRAMUSCULAR | Status: AC
  Administered 2021-05-14: 25 mg via INTRAVENOUS
  Filled 2021-05-14: qty 1

## 2021-05-14 MED ORDER — SODIUM CHLORIDE 0.9 % IV SOLN
Freq: Once | INTRAVENOUS | Status: AC
  Filled 2021-05-14: qty 250

## 2021-05-14 MED ORDER — IRON SUCROSE 20 MG/ML IV SOLN
200.0000 mg | Freq: Once | INTRAVENOUS | Status: AC
  Administered 2021-05-14: 200 mg via INTRAVENOUS
  Filled 2021-05-14: qty 10

## 2021-05-14 MED ORDER — SODIUM CHLORIDE 0.9 % IV SOLN: 200.0000 mg | INTRAVENOUS | Status: DC

## 2021-05-14 NOTE — Patient Instructions (Signed)

## 2021-05-15 ENCOUNTER — Ambulatory Visit: Payer: No Typology Code available for payment source

## 2021-05-16 ENCOUNTER — Inpatient Hospital Stay: Payer: Medicare Other

## 2021-05-16 DIAGNOSIS — C4491 Basal cell carcinoma of skin, unspecified: Secondary | ICD-10-CM

## 2021-05-16 HISTORY — DX: Basal cell carcinoma of skin, unspecified: C44.91

## 2021-05-17 ENCOUNTER — Ambulatory Visit: Payer: No Typology Code available for payment source

## 2021-05-23 ENCOUNTER — Inpatient Hospital Stay: Payer: Medicare Other | Attending: Oncology

## 2021-05-23 ENCOUNTER — Ambulatory Visit: Payer: No Typology Code available for payment source

## 2021-05-23 VITALS — BP 110/64 | HR 64 | Temp 96.8°F | Resp 18

## 2021-05-23 DIAGNOSIS — D509 Iron deficiency anemia, unspecified: Secondary | ICD-10-CM | POA: Diagnosis not present

## 2021-05-23 DIAGNOSIS — Z862 Personal history of diseases of the blood and blood-forming organs and certain disorders involving the immune mechanism: Secondary | ICD-10-CM

## 2021-05-23 MED ORDER — FAMOTIDINE 20 MG IN NS 100 ML IVPB
20.0000 mg | Freq: Once | INTRAVENOUS | Status: AC | PRN
  Administered 2021-05-23: 20 mg via INTRAVENOUS
  Filled 2021-05-23: qty 100

## 2021-05-23 MED ORDER — SODIUM CHLORIDE 0.9 % IV SOLN
Freq: Once | INTRAVENOUS | Status: AC
  Filled 2021-05-23: qty 250

## 2021-05-23 MED ORDER — IRON SUCROSE 20 MG/ML IV SOLN
200.0000 mg | Freq: Once | INTRAVENOUS | Status: AC
  Administered 2021-05-23: 200 mg via INTRAVENOUS
  Filled 2021-05-23: qty 10

## 2021-05-23 MED ORDER — SODIUM CHLORIDE 0.9 % IV SOLN: 200.0000 mg | INTRAVENOUS | Status: DC

## 2021-05-23 MED ORDER — DIPHENHYDRAMINE HCL 50 MG/ML IJ SOLN
25.0000 mg | INTRAMUSCULAR | Status: DC
  Administered 2021-05-23: 25 mg via INTRAVENOUS
  Filled 2021-05-23: qty 1

## 2021-05-23 NOTE — Progress Notes (Signed)
As I was pushing the venofer in pt stated she felt like she was going to pass out. Venofer stopped. NSS hung wide open. BP 133/75 HR 81 sats 100%.  Beckey Rutter NP here at chairside. Pt states she didn't take her PO pepcid so we hung PEPCID IV 20 mg. BP 126/74 HR 66 sats 100%.  C/O chest discomfort. Able to hold full conversation and text on phone.   Explained to discuss with her allergist. Verbalized understanding.  Will give one liter of fluid and monitor.   [id number]. Allen aware of VS- pt still c/o int. CP  does not radiate or change with breaths.  Pt felt stable for dc. Instructed if pain worsens to go to ED. Verbalized understanding

## 2021-05-23 NOTE — Patient Instructions (Signed)
C[facility]ER CENTER [place] [facility]  Discharge Instructions: Thank you for choosing [facility] to provide your oncology and hematology care.  If you have a lab appointment with the Cancer Center, please go directly to the Cancer Center and check in at the registration area.  Wear comfortable clothing and clothing appropriate for easy access to any Portacath or PICC line.   We strive to give you quality time with your provider. You may need to reschedule your appointment if you arrive late (15 or more minutes).  Arriving late affects you and other patients whose appointments are after yours.  Also, if you miss three or more appointments without notifying the office, you may be dismissed from the clinic at the provider's discretion.      For prescription refill requests, have your pharmacy contact our office and allow 72 hours for refills to be completed.    Today you received the following chemotherapy and/or immunotherapy agents VENOFER      To help prevent nausea and vomiting after your treatment, we encourage you to take your nausea medication as directed.  BELOW ARE SYMPTOMS THAT SHOULD BE REPORTED IMMEDIATELY: *FEVER GREATER THAN 100.4 F (38 C) OR HIGHER *CHILLS OR SWEATING *NAUSEA AND VOMITING THAT IS NOT CONTROLLED WITH YOUR NAUSEA MEDICATION *UNUSUAL SHORTNESS OF BREATH *UNUSUAL BRUISING OR BLEEDING *URINARY PROBLEMS (pain or burning when urinating, or frequent urination) *BOWEL PROBLEMS (unusual diarrhea, constipation, pain near the anus) TENDERNESS IN MOUTH AND THROAT WITH OR WITHOUT PRESENCE OF ULCERS (sore throat, sores in mouth, or a toothache) UNUSUAL RASH, SWELLING OR PAIN  UNUSUAL VAGINAL DISCHARGE OR ITCHING   Items with * indicate a potential emergency and should be followed up as soon as possible or go to the Emergency Department if any problems should occur.  Please show the CHEMOTHERAPY ALERT CARD or IMMUNOTHERAPY ALERT CARD at check-in to  the Emergency Department and triage nurse.  Should you have questions after your visit or need to cancel or reschedule your appointment, please contact CANCER CENTER [place] [facility]COL[facility]  [phone] and follow the prompts.  Office hours are 8:00 a.m. to 4:30 p.m. Monday - Friday. Please note that voicemails left after 4:00 p.m. may not be returned until the following business day.  We are closed weekends and major holidays. You have access to a nurse at all times for urgent questions. Please call the main number to the clinic [phone] and follow the prompts.  For any non-urgent questions, you may also contact your provider using MyChart. We now offer e-Visits for anyone 18 and older to request care online for non-urgent symptoms. For details visit [email or web address].   Also download the MyChart app! Go to the app store, search "MyChart", open the app, select [facility], and log in with your MyChart username and password.  Due to Covid, a mask is required upon entering the hospital/clinic. If you do not have a mask, one will be given to you upon arrival. For doctor visits, patients may have 1 support person aged 18 or older with them. For treatment visits, patients cannot have anyone with them due to current Covid guidelines and our immunocompromised population.   Iron Sucrose Injection What is this medication? IRON SUCROSE (EYE ern SOO krose) treats low levels of iron (iron deficiency anemia) in people with kidney disease. Iron is a mineral that plays an important role in making red blood cells, which carry oxygen from your lungs to the rest of your body. This medicine may   be used for other purposes; ask your health care provider or pharmacist if you have questions. COMMON BRAND NAME(S): Venofer What should I tell my care team before I take this medication? They need to know if you have any of these conditions: Anemia not caused by low iron levels Heart disease High levels  of iron in the blood Kidney disease Liver disease An unusual or allergic reaction to iron, other medications, foods, dyes, or preservatives Pregnant or trying to get pregnant Breast-feeding How should I use this medication? This medication is for infusion into a vein. It is given in a hospital or clinic setting. Talk to your care team about the use of this medication in children. While this medication may be prescribed for children as young as 2 years for selected conditions, precautions do apply. Overdosage: If you think you have taken too much of this medicine contact a poison control center or emergency room at once. NOTE: This medicine is only for you. Do not share this medicine with others. What if I miss a dose? It is important not to miss your dose. Call your care team if you are unable to keep an appointment. What may interact with this medication? Do not take this medication with any of the following: Deferoxamine Dimercaprol Other iron products This medication may also interact with the following: Chloramphenicol Deferasirox This list may not describe all possible interactions. Give your health care provider a list of all the medicines, herbs, non-prescription drugs, or dietary supplements you use. Also tell them if you smoke, drink alcohol, or use illegal drugs. Some items may interact with your medicine. What should I watch for while using this medication? Visit your care team regularly. Tell your care team if your symptoms do not start to get better or if they get worse. You may need blood work done while you are taking this medication. You may need to follow a special diet. Talk to your care team. Foods that contain iron include: whole grains/cereals, dried fruits, beans, or peas, leafy green vegetables, and organ meats (liver, kidney). What side effects may I notice from receiving this medication? Side effects that you should report to your care team as soon as  possible: Allergic reactions-skin rash, itching, hives, swelling of the face, lips, tongue, or throat Low blood pressure-dizziness, feeling faint or lightheaded, blurry vision Shortness of breath Side effects that usually do not require medical attention (report to your care team if they continue or are bothersome): Flushing Headache Joint pain Muscle pain Nausea Pain, redness, or irritation at injection site This list may not describe all possible side effects. Call your doctor for medical advice about side effects. You may report side effects to FDA at 1-800-FDA-1088. Where should I keep my medication? This medication is given in a hospital or clinic and will not be stored at home. NOTE: This sheet is a summary. It may not cover all possible information. If you have questions about this medicine, talk to your doctor, pharmacist, or health care provider.  2022 Elsevier/Gold Standard (2020-11-27 12:52:06)  

## 2021-05-28 ENCOUNTER — Ambulatory Visit: Payer: No Typology Code available for payment source

## 2021-05-28 ENCOUNTER — Inpatient Hospital Stay: Payer: Medicare Other

## 2021-05-30 ENCOUNTER — Telehealth: Payer: Self-pay | Admitting: *Deleted

## 2021-05-30 ENCOUNTER — Ambulatory Visit: Payer: No Typology Code available for payment source

## 2021-05-30 ENCOUNTER — Other Ambulatory Visit: Payer: Self-pay | Admitting: Oncology

## 2021-05-30 ENCOUNTER — Inpatient Hospital Stay: Payer: Medicare Other

## 2021-05-30 VITALS — BP 108/64 | HR 73 | Temp 98.2°F | Resp 18

## 2021-05-30 DIAGNOSIS — D509 Iron deficiency anemia, unspecified: Secondary | ICD-10-CM | POA: Diagnosis not present

## 2021-05-30 DIAGNOSIS — Z862 Personal history of diseases of the blood and blood-forming organs and certain disorders involving the immune mechanism: Secondary | ICD-10-CM

## 2021-05-30 MED ORDER — DIPHENHYDRAMINE HCL 50 MG/ML IJ SOLN
25.0000 mg | Freq: Once | INTRAMUSCULAR | Status: AC
  Administered 2021-05-30: 25 mg via INTRAVENOUS
  Filled 2021-05-30: qty 1

## 2021-05-30 MED ORDER — SODIUM CHLORIDE 0.9 % IV SOLN
200.0000 mg | INTRAVENOUS | Status: DC
  Administered 2021-05-30: 200 mg via INTRAVENOUS
  Filled 2021-05-30: qty 10

## 2021-05-30 MED ORDER — METHYLPREDNISOLONE SODIUM SUCC 125 MG IJ SOLR
40.0000 mg | Freq: Once | INTRAMUSCULAR | Status: AC
  Administered 2021-05-30: 40 mg via INTRAVENOUS
  Filled 2021-05-30: qty 2

## 2021-05-30 MED ORDER — SODIUM CHLORIDE 0.9 % IV SOLN
Freq: Once | INTRAVENOUS | Status: AC
  Filled 2021-05-30: qty 250

## 2021-05-30 NOTE — Patient Instructions (Signed)
CANCER CENTER [place] [facility] ONCOLOG[facility]   Discharge Instructions: Thank you for choosing [facility] to provide your oncology and hematology care.  If you have a lab appointment with the Cancer Center, please go directly to the Cancer Center and check in at the registration area.  We strive to give you quality time with your provider. You may need to reschedule your appointment if you arrive late (15 or more minutes).  Arriving late affects you and other patients whose appointments are after yours.  Also, if you miss three or more appointments without notifying the office, you may be dismissed from the clinic at the provider's discretion.      For prescription refill requests, have your pharmacy contact our office and allow 72 hours for refills to be completed.    Today you received the following: Venofer.      BELOW ARE SYMPTOMS THAT SHOULD BE REPORTED IMMEDIATELY: *FEVER GREATER THAN 100.4 F (38 C) OR HIGHER *CHILLS OR SWEATING *NAUSEA AND VOMITING THAT IS NOT CONTROLLED WITH YOUR NAUSEA MEDICATION *UNUSUAL SHORTNESS OF BREATH *UNUSUAL BRUISING OR BLEEDING *URINARY PROBLEMS (pain or burning when urinating, or frequent urination) *BOWEL PROBLEMS (unusual diarrhea, constipation, pain near the anus) TENDERNESS IN MOUTH AND THROAT WITH OR WITHOUT PRESENCE OF ULCERS (sore throat, sores in mouth, or a toothache) UNUSUAL RASH, SWELLING OR PAIN  UNUSUAL VAGINAL DISCHARGE OR ITCHING   Items with * indicate a potential emergency and should be followed up as soon as possible or go to the Emergency Department if any problems should occur.  Should you have questions after your visit or need to cancel or reschedule your appointment, please contact CANCER CENTER [place] [facility]  [phone] and follow the prompts.  Office hours are 8:00 a.m. to 4:30 p.m. Monday - Friday. Please note that voicemails left after 4:00 p.m. may not be returned until the following  business day.  We are closed weekends and major holidays. You have access to a nurse at all times for urgent questions. Please call the main number to the clinic [phone] and follow the prompts.  For any non-urgent questions, you may also contact your provider using MyChart. We now offer e-Visits for anyone 18 and older to request care online for non-urgent symptoms. For details visit [email or web address].   Also download the MyChart app! Go to the app store, search "MyChart", open the app, select [place], and log in with your MyChart username and password.  Due to Covid, a mask is required upon entering the hospital/clinic. If you do not have a mask, one will be given to you upon arrival. For doctor visits, patients may have 1 support person aged 18 or older with them. For treatment visits, patients cannot have anyone with them due to current Covid guidelines and our immunocompromised population.  

## 2021-05-30 NOTE — Telephone Encounter (Signed)
Allyson called me about premeds for this pt. Her allergy md send [place] a letter to give solumedrol 40 mg IV and then still give IV benadryl because of pt had reaction last time to venofer. I called Janese Banks and she was also in secure chat. Dr. Janese Banks said to give her benadryl she had last time and then give the Solumedrol 40 mg which was in the letter from allergy doctor. Orders were entered and allyson aware of it

## 2021-05-30 NOTE — Progress Notes (Signed)
0832- Patient reports she followed up with her allergist post Venofer reaction last week. Patient reports her allergist sent an e-mail to her and Dr. Janese Banks with recommendations for added pre-medications and slowing Venofer infusion down. MD, Dr. Janese Banks, notified and aware. Per MD order: patient to receive Benadryl 25 mg IV once and Solu-medrol 40 mg IV once prior to Venofer infusion; also MD is changing Venofer to be diluted in 0.9% Sodium Chloride and infuse over 30 minutes.  1049- Patient tolerated Venofer infusion well. Patient and vital signs stable. Patient discharged to home at this time.

## 2021-06-10 ENCOUNTER — Ambulatory Visit: Payer: Self-pay

## 2021-06-11 ENCOUNTER — Ambulatory Visit (INDEPENDENT_AMBULATORY_CARE_PROVIDER_SITE_OTHER): Payer: Medicare Other | Admitting: Family Medicine

## 2021-06-11 ENCOUNTER — Other Ambulatory Visit: Payer: Self-pay

## 2021-06-11 ENCOUNTER — Other Ambulatory Visit: Payer: Self-pay | Admitting: Obstetrics and Gynecology

## 2021-06-11 VITALS — BP 118/71 | HR 79 | Temp 98.5°F | Resp 16 | Ht 62.0 in | Wt 147.1 lb

## 2021-06-11 DIAGNOSIS — R5382 Chronic fatigue, unspecified: Secondary | ICD-10-CM

## 2021-06-11 DIAGNOSIS — G43109 Migraine with aura, not intractable, without status migrainosus: Secondary | ICD-10-CM

## 2021-06-11 DIAGNOSIS — E65 Localized adiposity: Secondary | ICD-10-CM

## 2021-06-11 DIAGNOSIS — G901 Familial dysautonomia [Riley-Day]: Secondary | ICD-10-CM

## 2021-06-11 DIAGNOSIS — G500 Trigeminal neuralgia: Secondary | ICD-10-CM

## 2021-06-11 DIAGNOSIS — I498 Other specified cardiac arrhythmias: Secondary | ICD-10-CM

## 2021-06-11 DIAGNOSIS — N898 Other specified noninflammatory disorders of vagina: Secondary | ICD-10-CM

## 2021-06-11 DIAGNOSIS — E663 Overweight: Secondary | ICD-10-CM

## 2021-06-11 DIAGNOSIS — E7800 Pure hypercholesterolemia, unspecified: Secondary | ICD-10-CM

## 2021-06-11 DIAGNOSIS — R002 Palpitations: Secondary | ICD-10-CM

## 2021-06-11 DIAGNOSIS — R739 Hyperglycemia, unspecified: Secondary | ICD-10-CM

## 2021-06-11 DIAGNOSIS — M5481 Occipital neuralgia: Secondary | ICD-10-CM

## 2021-06-11 DIAGNOSIS — G90A Postural orthostatic tachycardia syndrome (POTS): Secondary | ICD-10-CM

## 2021-06-11 DIAGNOSIS — R590 Localized enlarged lymph nodes: Secondary | ICD-10-CM

## 2021-06-11 DIAGNOSIS — G43E09 Chronic migraine with aura, not intractable, without status migrainosus: Secondary | ICD-10-CM

## 2021-06-11 DIAGNOSIS — Z0000 Encounter for general adult medical examination without abnormal findings: Secondary | ICD-10-CM | POA: Diagnosis not present

## 2021-06-11 NOTE — Assessment & Plan Note (Signed)
Referral to local neuro for ongoing care

## 2021-06-11 NOTE — Assessment & Plan Note (Signed)
Has been followed by neurology Previously well controlled Continue amitriptyline TMJ dysfunction EDS contribute some Referral to local neurologist

## 2021-06-11 NOTE — Assessment & Plan Note (Signed)
Reviewed last lipid panel Not currently on a statin Recheck FLP and CMP Discussed diet and exercise  

## 2021-06-11 NOTE — Assessment & Plan Note (Signed)
Upcoming appointment with cardiology

## 2021-06-11 NOTE — Assessment & Plan Note (Addendum)
Will refer to local neurologist

## 2021-06-11 NOTE — Assessment & Plan Note (Signed)
Palpable lymph node in the right axilla that is visible as well Had recent normal mammogram, so we will only order axillary ultrasound at this time, but may need diagnostic mammogram and breast ultrasound in the future as well

## 2021-06-11 NOTE — Assessment & Plan Note (Signed)
Likely 2/2 POTS, but as above, she will discuss at upcoming Cards appt

## 2021-06-11 NOTE — Progress Notes (Signed)
Annual Wellness Visit     Patient: Autumn Zhang, Female    DOB: Nov 27, 1970, 50 y.o.   MRN: [id number] Visit Date: 06/11/2021  Today's Provider: Shirlee Latch, MD   Chief Complaint  Patient presents with   Medicare Wellness   Subjective    Autumn Zhang is a 50 y.o. female who presents today for her Annual Wellness Visit. She reports consuming a  special   diet. Home exercise routine includes walking 6 hrs per week. She generally feels fairly well. She reports sleeping poorly. She does have additional problems to discuss today.   HPI  Would like to find a new neurologist for her trigeminal neuralgia, occipital neuralgia, and chronic migraines.  She was going to be moving to [facility], but it now looks like she may be staying in [place] and would like to see someone locally  She continues to struggle with buffalo hump and inability to lose weight.  She is worried about metabolic causes.  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  Exercising can be limited at times related to these.  She does not feel like she is eating large volumes, but she is unable to lose weight.  She has been unable to see a nutritionist due to her insurance.  Reports right axillary lymphadenopathy months 11/21.  She had this ultrasounded within a few weeks of it appearing and nothing was seen, but it seems to have gotten larger at this point.  She has had a mammogram since that time that was normal.  She has been followed by Dr. Smith Robert for her iron deficiency anemia.  She recently had an iron infusion.  She would like to have her CBC and ferritin/iron panel monitored every 3 months by PCP and referred back to Dr. Smith Robert when her ferritin drops below 75.  Medications: Outpatient Medications Prior to Visit  Medication Sig   acetaminophen (TYLENOL) 500 MG tablet Take 500 mg by mouth every 6 (six) hours as  needed.   albuterol (PROVENTIL HFA;VENTOLIN HFA) 108 (90 Base) MCG/ACT inhaler Inhale 2 puffs into the lungs every 4 (four) hours as needed.    amphetamine-dextroamphetamine (ADDERALL) 5 MG tablet Take by mouth daily. 5mg  in the morning and 1/2 tablet later in the morning   Ascorbic Acid (VITAMIN C) 1000 MG tablet Take 1,000 mg by mouth daily. probably total is 900 mg   aspirin 325 MG tablet Take by mouth.   augmented betamethasone dipropionate (DIPROLENE-AF) 0.05 % cream APPLY TWICE DAILY AS NEEDED TO THE AFFECTED AREAS FOR 5-7 DAYS FOR ECZEMA.   b complex vitamins capsule Take 1 capsule by mouth daily. 1/4 capsule a day   BELSOMRA 5 MG TABS Take 1 tablet by mouth at bedtime as needed.    Blood Pressure Monitoring (BLOOD PRESSURE CUFF) MISC Take blood pressure as needed for symptoms.   Boswellia Serrata (BOSWELLIA PO) Take 150 mg by mouth every morning.    bupivacaine-EPINEPHrine (MARCAINE W/ EPI) 0.5% -1:200000 SOLN by Infiltration route as needed.   Calcium Citrate 200 MG TABS Take 600 mg by mouth.   cromolyn (GASTROCROM) 100 MG/5ML solution Take by mouth 4 (four) times daily -  before meals and at bedtime.   Cyanocobalamin (VITAMIN B-12) 2500 MCG SUBL Place under the tongue.   desloratadine (CLARINEX REDITAB) 5 MG disintegrating tablet Take by mouth.   Diclofenac Sodium (ASPERCREME ARTHRITIS PAIN EX) Apply  topically as needed.   diphenhydrAMINE (BENADRYL) 12.5 MG/5ML liquid Take by mouth 4 (four) times daily as needed.   diphenhydrAMINE-Allantoin 2-0.5 % CREA Apply 1 application topically as needed.    docusate sodium (COLACE) 100 MG capsule Take 100 mg by mouth every other day.    econazole nitrate 1 % cream APPLY TO FEET TWICE A DAY FOR 2 4 WEEKS AS NEEDED FOR FLARE   EPINEPHrine 0.3 mg/0.3 mL IJ SOAJ injection INJECT ONE AUTO-INJECTOR INTRAMUSCULARLY AS NEEDED   estradiol (ESTRACE) 0.1 MG/GM vaginal cream INSERT 1 GRAM VAGINALLY    TWICE WEEKLY   famotidine (PEPCID) 10 MG tablet Take 10  mg by mouth daily.    fexofenadine (ALLEGRA) 180 MG tablet Take 180 mg by mouth daily as needed.    guaifenesin (HUMIBID E) 400 MG TABS tablet Take 400 mg by mouth daily.    hydrOXYzine (ATARAX/VISTARIL) 50 MG tablet Take 25 mg by mouth as needed.   IODINE EX Apply 225 mcg topically. Every 3 days   KETAMINE HCL SL Place into right nostril as needed.   ketotifen (ZADITOR) 0.025 % ophthalmic solution Place 1 drop into both eyes daily as needed.   Ketotifen Fumarate POWD Take 1 mg by mouth in the morning and at bedtime. If needed 2 more time a day for total of 4   L-Lysine 500 MG CAPS Take by mouth 2 (two) times daily.   L-Theanine 100 MG CAPS Take 200 mg by mouth daily.    levocetirizine (XYZAL) 2.5 MG/5ML solution    Light Mineral Oil-Mineral Oil 0.5-0.5 % EMUL Apply 1 application to eye daily.   lipase/protease/amylase (CREON) 12000-38000 units CPEP capsule Take 12,000 Units by mouth as directed.   loperamide (IMODIUM) 2 MG capsule Take by mouth as needed for diarrhea or loose stools.   Magnesium Citrate POWD Take 435 mg by mouth daily.   Melatonin 1 MG/ML LIQD Take by mouth daily.    montelukast (SINGULAIR) 5 MG chewable tablet    naproxen (NAPROSYN) 250 MG tablet Take 250 mg by mouth 3 days.   Nerve Stimulator (CEFALY KIT) DEVI    Non Gelatin Capsules, Empty, (CAPSULE #3 CLEAR/CLEAR VEG) CAPS Take 1 capsule by mouth daily.   omega-3 acid ethyl esters (LOVAZA) 1 g capsule Take 2 capsules (2 g total) by mouth 2 (two) times daily.   polyethylene glycol (MIRALAX / GLYCOLAX) packet Take 17 g by mouth daily as needed.    pseudoephedrine (SUDAFED) 30 MG tablet Take 30 mg by mouth every 4 (four) hours as needed for congestion.    Quercetin 500 MG CAPS Take by mouth daily.   RESTASIS 0.05 % ophthalmic emulsion INSTILL 1 DROP INTO BOTH EYES TWICE A DAY   Riboflavin (VITAMIN B-2 PO) Take 100 mg by mouth 3 (three) times daily.   Rimegepant Sulfate (NURTEC) 75 MG TBDP Take 1 tablet by mouth as needed.    Secukinumab (COSENTYX [place]) Inject into the skin. Every 28days   simethicone (MYLICON) 80 MG chewable tablet Chew 80 mg by mouth every 6 (six) hours as needed for flatulence.   Sodium Fluoride 1.1 % PSTE Place onto teeth.   TRIAMCINOLONE ACETONIDE,NASAL, NA Place into the nose as needed.   UBRELVY 50 MG TABS TAKE 1 TABLET BY MOUTH ONCE AS NEEDED FOR UP TO 1 DOSE   Vitamin D, Cholecalciferol, 10 MCG (400 UNIT) TABS Take by mouth daily.    [DISCONTINUED] estradiol (ESTRACE) 0.1 MG/GM vaginal cream Insert 1 g vaginally twice weekly   [  DISCONTINUED] levocetirizine (XYZAL) 5 MG tablet Take 1 tablet (5 mg total) by mouth 2 (two) times daily.   [DISCONTINUED] montelukast (SINGULAIR) 10 MG tablet Take 10 mg by mouth daily.    No facility-administered medications prior to visit.    Allergies  Allergen Reactions   Iodinated Diagnostic Agents Hives, Itching, Palpitations, Photosensitivity and Shortness Of Breath    Other reaction(s): Headache   Ioversol Anxiety, Hives, Itching and Palpitations   Ciprofloxacin Other (See Comments)   Eggs Or Egg-Derived Products Other (See Comments)    Exacerbates neuralgia symptoms Exacerbates neuralgia symptoms Congestion & Headaches Exacerbates neuralgia symptoms Congestion & Headaches    Ginger Other (See Comments)    Congestion & Headaches Congestion & Headaches    Levofloxacin Other (See Comments)    Congestion & Headaches Congestion & Headaches    Other Other (See Comments)    Novocaine (IV) form Hearing loss Novocaine (IV) form Hearing loss  Congestion & Headaches Congestion & Headaches Congestion & Headaches Congestion & Headaches  Other reaction(s): Unknown   Soybean-Containing Drug Products Other (See Comments)    Congestion & Headaches    Bee Pollen    Bupropion    Celebrex [Celecoxib]    Codeine    Dexilant [Dexlansoprazole]    Dulera [Mometasone Furo-Formoterol Fum]    Fentanyl    Fluticasone    Gabapentin    Gluten Meal     Iodine    Lidocaine     Rash to topical cream   Lyrica [Pregabalin]    Mold Extract [Trichophyton]    Morphine And Related    Penicillins     Skin testing positive for allergy   Pollen Extract    Prilosec [Omeprazole]    Rizatriptan Other (See Comments)    Dizziness, headache, myalgias, nausea   Salmeterol Xinafoate    Silenor [Doxepin Hcl]    Stevioside Other (See Comments)   Tomato    Xylitol Other (See Comments)   Ethanol Dermatitis, Other (See Comments), Itching and Palpitations   Hydrocodone Nausea Only   Ibuprofen Nausea Only    At high doses At high doses    Lac Bovis Diarrhea    Facial burning sensation Facial burning sensation Facial burning sensation Facial burning sensation    Lanolin Other (See Comments)    Exacerbates neuralgia symptoms Wool causes hives.    Milk-Related Compounds Diarrhea    Facial burning sensation    Wheat Bran Diarrhea   Yeast-Related Products Other (See Comments)    Headache, flushing    Patient Care Team: Virginia Crews, MD as PCP - General (Family Medicine) Clent Jacks, RN as Registered Nurse  Review of Systems  Constitutional:  Positive for activity change, fatigue and unexpected weight change.  HENT:  Positive for congestion, facial swelling, postnasal drip, sinus pressure, sore throat, tinnitus and trouble swallowing.   Eyes:  Positive for photophobia, discharge, redness and visual disturbance.  Respiratory:  Positive for apnea, chest tightness and shortness of breath.   Cardiovascular:  Positive for chest pain, palpitations and leg swelling.  Gastrointestinal:  Positive for abdominal distention, abdominal pain, constipation, diarrhea, nausea and rectal pain.  Endocrine: Positive for cold intolerance, heat intolerance, polydipsia and polyuria.  Genitourinary:  Positive for difficulty urinating, dysuria, flank pain, genital sores, urgency and vaginal pain.  Musculoskeletal:  Positive for arthralgias, back pain,  gait problem, joint swelling, myalgias, neck pain and neck stiffness.  Skin:  Positive for rash.  Allergic/Immunologic: Positive for environmental allergies, food allergies and immunocompromised  state.  Neurological:  Positive for dizziness, tremors, facial asymmetry, weakness, light-headedness and headaches.  Hematological:  Bruises/bleeds easily.  Psychiatric/Behavioral:  Positive for decreased concentration, dysphoric mood and sleep disturbance. The patient is nervous/anxious.    Last CBC Lab Results  Component Value Date   WBC 6.4 05/06/2021   HGB 11.9 (L) 05/06/2021   HCT 37.1 05/06/2021   MCV 93.5 05/06/2021   MCH 30.0 05/06/2021   RDW 12.0 05/06/2021   PLT 291 40/34/7425   Last metabolic panel Lab Results  Component Value Date   GLUCOSE 150 (H) 12/05/2020   NA 138 12/05/2020   K 4.4 12/05/2020   CL 101 12/05/2020   CO2 29 12/05/2020   BUN 16 12/05/2020   CREATININE 0.57 12/05/2020   GFRNONAA >60 12/05/2020   CALCIUM 9.8 12/05/2020   PROT 7.7 12/05/2020   ALBUMIN 4.7 12/05/2020   LABGLOB 2.4 06/12/2020   AGRATIO 2.0 06/12/2020   BILITOT 0.7 12/05/2020   ALKPHOS 67 12/05/2020   AST 24 12/05/2020   ALT 21 12/05/2020   ANIONGAP 8 12/05/2020   Last lipids Lab Results  Component Value Date   CHOL 208 (H) 06/12/2020   HDL 80 06/12/2020   LDLCALC 109 (H) 06/12/2020   TRIG 111 06/12/2020   CHOLHDL 2.6 06/12/2020   Last hemoglobin A1c Lab Results  Component Value Date   HGBA1C 5.1 06/12/2020   Last thyroid functions Lab Results  Component Value Date   TSH 1.600 06/12/2020   Last vitamin D Lab Results  Component Value Date   VD25OH 32.8 06/12/2020   Last vitamin B12 and Folate Lab Results  Component Value Date   VITAMINB12 589 05/06/2021   FOLATE 25.0 09/02/2018        Objective    Vitals: BP 118/71 (BP Location: Left Arm, Patient Position: Sitting, Cuff Size: Large)   Pulse 79   Temp 98.5 F (36.9 C) (Oral)   Resp 16   Ht $R'5\' 2"'iw$  (1.575 m)    Wt 147 lb 1.6 oz (66.7 kg)   BMI 26.90 kg/m  BP Readings from Last 3 Encounters:  06/11/21 118/71  05/30/21 108/64  05/23/21 110/64   Wt Readings from Last 3 Encounters:  06/11/21 147 lb 1.6 oz (66.7 kg)  03/07/21 147 lb 9.6 oz (67 kg)  11/09/20 144 lb (65.3 kg)      Physical Exam Vitals reviewed.  Constitutional:      General: She is not in acute distress.    Appearance: Normal appearance. She is well-developed. She is not diaphoretic.  HENT:     Head: Normocephalic and atraumatic.     Right Ear: Tympanic membrane, ear canal and external ear normal.     Left Ear: Tympanic membrane, ear canal and external ear normal.     Nose: Nose normal.     Mouth/Throat:     Mouth: Mucous membranes are moist.     Pharynx: Oropharynx is clear. No oropharyngeal exudate.  Eyes:     General: No scleral icterus.    Conjunctiva/sclera: Conjunctivae normal.     Pupils: Pupils are equal, round, and reactive to light.  Neck:     Thyroid: No thyromegaly.  Cardiovascular:     Rate and Rhythm: Normal rate.     Pulses: Normal pulses.     Heart sounds: Normal heart sounds. No murmur heard.    Comments: Rhythm is irregularly irregular.  She does not sound as though she is in A. fib, but her heart rate will intermittently  take off at times and maintain a regular, but fast rhythm Pulmonary:     Effort: Pulmonary effort is normal. No respiratory distress.     Breath sounds: Normal breath sounds. No wheezing or rales.  Abdominal:     General: There is no distension.     Palpations: Abdomen is soft.     Tenderness: There is no abdominal tenderness.  Musculoskeletal:        General: No deformity.     Cervical back: Neck supple.     Right lower leg: No edema.     Left lower leg: No edema.     Comments: Positive buffalo hump, possible slight moon facies  Lymphadenopathy:     Cervical: No cervical adenopathy.  Skin:    General: Skin is warm and dry.     Findings: No rash.  Neurological:      Mental Status: She is alert and oriented to person, place, and time. Mental status is at baseline.     Sensory: No sensory deficit.     Motor: No weakness.     Gait: Gait normal.  Psychiatric:        Mood and Affect: Mood normal.        Behavior: Behavior normal.        Thought Content: Thought content normal.     Most recent functional status assessment: In your present state of health, do you have any difficulty performing the following activities: 06/11/2021  Hearing? N  Vision? N  Difficulty concentrating or making decisions? Y  Walking or climbing stairs? Y  Dressing or bathing? Y  Doing errands, shopping? Y  Some recent data might be hidden   Most recent fall risk assessment: Fall Risk  06/11/2021  Falls in the past year? 0  Number falls in past yr: 0  Injury with Fall? 0  Risk for fall due to : No Fall Risks  Follow up Falls evaluation completed    Most recent depression screenings: PHQ 2/9 Scores 06/11/2021 03/07/2021  PHQ - 2 Score 2 1  PHQ- 9 Score 11 8   Most recent cognitive screening: 6CIT Screen 06/11/2021  What Year? 0 points  What month? 0 points  What time? 0 points  Count back from 20 0 points  Months in reverse 0 points  Repeat phrase 0 points  Total Score 0   Most recent Audit-C alcohol use screening Alcohol Use Disorder Test (AUDIT) 06/11/2021  1. How often do you have a drink containing alcohol? 0  2. How many drinks containing alcohol do you have on a typical day when you are drinking? 0  3. How often do you have six or more drinks on one occasion? 0  AUDIT-C Score 0  Alcohol Brief Interventions/Follow-up -   A score of 3 or more in women, and 4 or more in men indicates increased risk for alcohol abuse, EXCEPT if all of the points are from question 1   No results found for any visits on 06/11/21.  Assessment & Plan     Annual wellness visit done today including the all of the following: Reviewed patient's Family Medical History Reviewed and  updated list of patient's medical providers Assessment of cognitive impairment was done Assessed patient's functional ability Established a written schedule for health screening [place] Completed and Reviewed  Exercise Activities and Dietary recommendations  Goals   None     Immunization History  Administered Date(s) Administered   Influenza Inj Mdck Quad Pf  05/24/2018   Influenza, High Dose Seasonal PF 05/24/2018   Influenza,inj,Quad PF,6+ Mos 05/29/2016, 06/18/2017, 05/20/2019, 06/08/2020   Influenza,inj,quad, With Preservative 05/24/2018   Influenza-Unspecified 05/24/2018, 05/20/2019   Moderna Sars-Covid-2 Vaccination 12/28/2020   PFIZER(Purple Top)SARS-COV-2 Vaccination 12/01/2019, 12/22/2019, 06/21/2020   Pneumococcal Conjugate-13 04/06/2015, 11/08/2017   Tdap 07/17/2005, 10/29/2015   Zoster Recombinat (Shingrix) 07/13/2020, 09/25/2020    Health Maintenance  Topic Date Due   COLONOSCOPY (Pts 45-33yrs Insurance coverage will need to be confirmed)  10/07/2020   COVID-19 Vaccine (5 - Booster for [place] series) 12/08/2021 (Originally 04/29/2021)   INFLUENZA VACCINE  12/13/2021 (Originally 04/15/2021)   MAMMOGRAM  04/09/2023   PAP SMEAR-Modifier  09/08/2023   TETANUS/TDAP  10/28/2025   Hepatitis C Screening  Completed   HIV Screening  Completed   Zoster Vaccines- Shingrix  Completed   HPV VACCINES  Aged Out     Discussed health benefits of physical activity, and encouraged her to engage in regular exercise appropriate for her age and condition.    Problem List Items Addressed This Visit       Cardiovascular and Mediastinum   Chronic migraine with aura    Has been followed by neurology Previously well controlled Continue amitriptyline TMJ dysfunction EDS contribute some Referral to local neurologist       Relevant Medications   bupivacaine-EPINEPHrine (MARCAINE W/ EPI) 0.5% -1:200000 SOLN   KETAMINE HCL SL   Other Relevant Orders    Ambulatory referral to Neurology   POTS (postural orthostatic tachycardia syndrome)    Followed by cardiology Her irregular rate could be related to POTS Advised her to mention this to her cardiologist next week Will not do a Zio patch at this time        Nervous and Auditory   Trigeminal neuralgia    Will refer to local neurologist      Relevant Medications   amphetamine-dextroamphetamine (ADDERALL) 5 MG tablet   Other Relevant Orders   Ambulatory referral to Neurology   Dysautonomia [facility])    Upcoming appointment with cardiology      Relevant Orders   TSH     Immune and Lymphatic   Axillary lymphadenopathy    Palpable lymph node in the right axilla that is visible as well Had recent normal mammogram, so we will only order axillary ultrasound at this time, but may need diagnostic mammogram and breast ultrasound in the future as well      Relevant Orders   [place] AXILLA RIGHT     Other   Chronic fatigue   Relevant Orders   TSH   Hypercholesterolemia    Reviewed last lipid panel Not currently on a statin Recheck FLP and CMP Discussed diet and exercise       Relevant Orders   Comprehensive metabolic panel   Lipid panel   Hyperglycemia   Relevant Orders   Hemoglobin A1c   Occipital neuralgia    Referral to local neuro for ongoing care      Relevant Orders   Ambulatory referral to Neurology   Palpitations    Likely 2/2 POTS, but as above, she will discuss at upcoming Cards appt      Other Visit Diagnoses     Encounter for annual wellness visit (AWV) in Medicare patient    -  Primary   Relevant Orders   TSH   Comprehensive metabolic panel   Lipid panel   Overweight       Relevant Orders   Amb Ref to Medical Weight Management  Cortisol   Hemoglobin A1c   Buffalo hump       Relevant Orders   Cortisol   Hemoglobin A1c        Return in about 3 months (around 09/10/2021) for chronic disease f/u.     I, [name], MD, have reviewed all  documentation for this visit. The documentation on 06/11/21 for the exam, diagnosis, procedures, and orders are all accurate and complete.   Burech Mcfarland, Dionne Bucy, MD, MPH [facility]

## 2021-06-11 NOTE — Assessment & Plan Note (Signed)
Followed by cardiology Her irregular rate could be related to POTS Advised her to mention this to her cardiologist next week Will not do a Zio patch at this time

## 2021-06-12 ENCOUNTER — Encounter: Payer: Self-pay | Admitting: Family Medicine

## 2021-06-12 ENCOUNTER — Other Ambulatory Visit: Payer: Self-pay | Admitting: *Deleted

## 2021-06-12 ENCOUNTER — Inpatient Hospital Stay
Admission: RE | Admit: 2021-06-12 | Discharge: 2021-06-12 | Disposition: A | Discharge status: Home / Self Care | Payer: Self-pay | Source: Ambulatory Visit | Attending: *Deleted | Admitting: *Deleted

## 2021-06-12 DIAGNOSIS — Z1231 Encounter for screening mammogram for malignant neoplasm of breast: Secondary | ICD-10-CM

## 2021-06-13 ENCOUNTER — Telehealth: Payer: Self-pay | Admitting: Family Medicine

## 2021-06-13 ENCOUNTER — Inpatient Hospital Stay: Payer: Medicare Other

## 2021-06-13 ENCOUNTER — Other Ambulatory Visit: Payer: Self-pay | Admitting: Family Medicine

## 2021-06-13 DIAGNOSIS — R590 Localized enlarged lymph nodes: Secondary | ICD-10-CM

## 2021-06-13 NOTE — Telephone Encounter (Signed)
I'm ok with either. Which does she prefer?

## 2021-06-13 NOTE — Telephone Encounter (Signed)
Pt called about the ultra sound that was gon got be ordered / pt spoke with [facility] and if she gets the ultrasound there she will have to have a mammogram order as well or she can have just the ultra sound order placed at [facility] / please advise / pt is ok with either option

## 2021-06-14 NOTE — Telephone Encounter (Signed)
Patient advised. She is requesting to go to [facility]. Will fax order [phone].

## 2021-06-17 ENCOUNTER — Inpatient Hospital Stay: Payer: Medicare Other

## 2021-06-18 ENCOUNTER — Encounter: Payer: Self-pay | Admitting: Family Medicine

## 2021-06-18 ENCOUNTER — Inpatient Hospital Stay: Payer: Medicare Other

## 2021-06-19 LAB — TSH: TSH: 1.77 u[IU]/mL (ref 0.450–4.500)

## 2021-06-19 LAB — COMPREHENSIVE METABOLIC PANEL
ALT: 14 IU/L (ref 0–32)
AST: 21 IU/L (ref 0–40)
Albumin/Globulin Ratio: 2.2 (ref 1.2–2.2)
Albumin: 4.7 g/dL (ref 3.8–4.9)
Alkaline Phosphatase: 106 IU/L (ref 44–121)
BUN/Creatinine Ratio: 17 (ref 9–23)
BUN: 11 mg/dL (ref 6–24)
Bilirubin Total: 0.4 mg/dL (ref 0.0–1.2)
CO2: 26 mmol/L (ref 20–29)
Calcium: 9.9 mg/dL (ref 8.7–10.2)
Chloride: 100 mmol/L (ref 96–106)
Creatinine, Ser: 0.66 mg/dL (ref 0.57–1.00)
Globulin, Total: 2.1 g/dL (ref 1.5–4.5)
Glucose: 96 mg/dL (ref 70–99)
Potassium: 4.3 mmol/L (ref 3.5–5.2)
Sodium: 139 mmol/L (ref 134–144)
Total Protein: 6.8 g/dL (ref 6.0–8.5)
eGFR: 105 mL/min/{1.73_m2} (ref >59)

## 2021-06-19 LAB — LIPID PANEL
Chol/HDL Ratio: 2.6 ratio (ref 0.0–4.4)
Cholesterol, Total: 188 mg/dL (ref 100–199)
HDL: 72 mg/dL (ref >39)
LDL Chol Calc (NIH): 105 mg/dL — ABNORMAL HIGH (ref 0–99)
Triglycerides: 59 mg/dL (ref 0–149)
VLDL Cholesterol Cal: 11 mg/dL (ref 5–40)

## 2021-06-19 LAB — HEMOGLOBIN A1C
Est. average glucose Bld gHb Est-mCnc: 100 mg/dL
Hgb A1c MFr Bld: 5.1 % (ref 4.8–5.6)

## 2021-06-19 LAB — CORTISOL: Cortisol: 11.9 ug/dL

## 2021-06-21 ENCOUNTER — Telehealth: Payer: Self-pay

## 2021-06-21 ENCOUNTER — Encounter: Payer: Self-pay | Admitting: Oncology

## 2021-06-21 NOTE — Telephone Encounter (Signed)
Copied from [place] [id number]. Topic: General - Other >> Jun 21, 2021 11:48 AM Erick Blinks wrote: Reason for CRM: Mariann Laster called from [facility] to report that they are missing the provider name and signature for breast imaging order. Please advise and fax back to the fax listed below.  Breast imaging fax: [phone] Best contact: [phone] (direct line)

## 2021-06-22 NOTE — Telephone Encounter (Signed)
Cancel future iv iron rx

## 2021-06-24 NOTE — Telephone Encounter (Signed)
I did not see this form in my stack of paperwork. I sent an electronic order. Do they want a paper Rx? If so, please write it and I will sign it

## 2021-06-25 ENCOUNTER — Other Ambulatory Visit: Payer: Self-pay | Admitting: *Deleted

## 2021-06-25 DIAGNOSIS — Z862 Personal history of diseases of the blood and blood-forming organs and certain disorders involving the immune mechanism: Secondary | ICD-10-CM

## 2021-06-25 NOTE — Telephone Encounter (Signed)
Orders faxed to [facility]

## 2021-06-25 NOTE — Telephone Encounter (Signed)
Signed. thanks

## 2021-06-25 NOTE — Telephone Encounter (Signed)
Cbc ferritin and iron studies in 3 and 6 months. See me in 6. Check b12 in 3 months

## 2021-06-28 ENCOUNTER — Ambulatory Visit: Payer: No Typology Code available for payment source

## 2021-07-02 ENCOUNTER — Emergency Department: Payer: Medicare Other

## 2021-07-02 ENCOUNTER — Other Ambulatory Visit: Payer: Self-pay

## 2021-07-02 ENCOUNTER — Ambulatory Visit: Payer: Self-pay | Admitting: *Deleted

## 2021-07-02 ENCOUNTER — Encounter: Payer: Self-pay | Admitting: Oncology

## 2021-07-02 ENCOUNTER — Encounter: Payer: Self-pay | Admitting: Emergency Medicine

## 2021-07-02 ENCOUNTER — Emergency Department
Admission: EM | Admit: 2021-07-02 | Discharge: 2021-07-02 | Disposition: A | Discharge status: Home / Self Care | Payer: Medicare Other | Attending: Emergency Medicine | Admitting: Emergency Medicine

## 2021-07-02 DIAGNOSIS — R4701 Aphasia: Secondary | ICD-10-CM | POA: Diagnosis not present

## 2021-07-02 DIAGNOSIS — Z85828 Personal history of other malignant neoplasm of skin: Secondary | ICD-10-CM | POA: Diagnosis not present

## 2021-07-02 DIAGNOSIS — Z79899 Other long term (current) drug therapy: Secondary | ICD-10-CM | POA: Diagnosis not present

## 2021-07-02 DIAGNOSIS — Z7982 Long term (current) use of aspirin: Secondary | ICD-10-CM | POA: Diagnosis not present

## 2021-07-02 DIAGNOSIS — J45909 Unspecified asthma, uncomplicated: Secondary | ICD-10-CM | POA: Insufficient documentation

## 2021-07-02 DIAGNOSIS — Z85038 Personal history of other malignant neoplasm of large intestine: Secondary | ICD-10-CM | POA: Diagnosis not present

## 2021-07-02 LAB — COMPREHENSIVE METABOLIC PANEL
ALT: 18 U/L (ref 0–44)
AST: 23 U/L (ref 15–41)
Albumin: 4.3 g/dL (ref 3.5–5.0)
Alkaline Phosphatase: 90 U/L (ref 38–126)
Anion gap: 6 (ref 5–15)
BUN: 16 mg/dL (ref 6–20)
CO2: 28 mmol/L (ref 22–32)
Calcium: 9.5 mg/dL (ref 8.9–10.3)
Chloride: 106 mmol/L (ref 98–111)
Creatinine, Ser: 0.62 mg/dL (ref 0.44–1.00)
GFR, Estimated: >60 mL/min (ref >60)
Glucose, Bld: 103 mg/dL — ABNORMAL HIGH (ref 70–99)
Potassium: 3.8 mmol/L (ref 3.5–5.1)
Sodium: 140 mmol/L (ref 135–145)
Total Bilirubin: 0.7 mg/dL (ref 0.3–1.2)
Total Protein: 7.6 g/dL (ref 6.5–8.1)

## 2021-07-02 LAB — CBC WITH DIFFERENTIAL/PLATELET
Abs Immature Granulocytes: 0.04 10*3/uL (ref 0.00–0.07)
Basophils Absolute: 0.1 10*3/uL (ref 0.0–0.1)
Basophils Relative: 1 %
Eosinophils Absolute: 0.1 10*3/uL (ref 0.0–0.5)
Eosinophils Relative: 1 %
HCT: 38.5 % (ref 36.0–46.0)
Hemoglobin: 13 g/dL (ref 12.0–15.0)
Immature Granulocytes: 1 %
Lymphocytes Relative: 24 %
Lymphs Abs: 1.9 10*3/uL (ref 0.7–4.0)
MCH: 31.3 pg (ref 26.0–34.0)
MCHC: 33.8 g/dL (ref 30.0–36.0)
MCV: 92.5 fL (ref 80.0–100.0)
Monocytes Absolute: 0.6 10*3/uL (ref 0.1–1.0)
Monocytes Relative: 7 %
Neutro Abs: 5.5 10*3/uL (ref 1.7–7.7)
Neutrophils Relative %: 66 %
Platelets: 311 10*3/uL (ref 150–400)
RBC: 4.16 MIL/uL (ref 3.87–5.11)
RDW: 12.3 % (ref 11.5–15.5)
WBC: 8.1 10*3/uL (ref 4.0–10.5)
nRBC: 0 % (ref 0.0–0.2)

## 2021-07-02 NOTE — Telephone Encounter (Signed)
Lmtcb. PEC please advise as below. 

## 2021-07-02 NOTE — Telephone Encounter (Signed)
New slurred speech definitely warrants an ED visit. Please let patient know

## 2021-07-02 NOTE — Telephone Encounter (Signed)
Left message

## 2021-07-02 NOTE — Telephone Encounter (Signed)
Reason for Disposition  [1] Loss of speech or garbled speech AND [2] gradual onset (e.g., days to weeks) AND [3] present now    Speech difficulty  Patient sounds very sick or weak to the triager    Instructed to go to the ED  Answer Assessment - Initial Assessment Questions 1. SYMPTOM: "What is the main symptom you are concerned about?" (e.g., weakness, numbness)     Pt calling in c/o having difficulty with speaking, having brain fog, her movements being slower than normal.   She has dysautonomy.   Her left jaw just hangs there.   She uses a band, facial tape and a cervical collar to support it.   Dr. Brita Romp is aware of my issues.   I feel like I'm having the same symptoms as in 2018 when I was diagnosed with dysautonomy.   Her smile is even, Her arms don't drift when she holds them out in front of her.   Her husband is with her and assisting her.   She is c/o having brain fog.   I was trying to stir something and I couldn't do it due to my muscles not working well.   Yesterday she had an episode of feeling like she was going to pass out presyncope but didn't pass out.  2. ONSET: "When did this start?" (minutes, hours, days; while sleeping)     The speech difficulty started yesterday.   Most of the other symptoms have been coming on for a couple of months. Dr. Brita Romp knows I was having a fast heart rate.   I saw the cardiologist and he put me on medication for the tachycardia.   Oct. 6 I saw him he said my symptoms were consistent with my problem.   I have a call in to him and waiting for cardiology to cal me back.   My BP is elevated 151/88 is high for me.    3. LAST NORMAL: "When was the last time you (the patient) were normal (no symptoms)?"     See above I'm seeing a neurologist Nov. 2nd here in [place].   4. PATTERN "Does this come and go, or has it been constant since it started?"  "Is it present now?"     Constant and getting worse over the past couple of months.   The speech  difficulty has gotten worse since yesterday.     2018 I had the same speech difficulty then.    5. CARDIAC SYMPTOMS: "Have you had any of the following symptoms: chest pain, difficulty breathing, palpitations?"     I'm having the tachycardia on and off with chest discomfort.   I'm having chest tightness from the adrenalin surges I get from the dysautonomy.   I'm having chest tightness now.   My normal heart rate is upper 70s.   My Fit bit today showing 143 while I was walking up a hill.   I'm getting winded easier these days. 6. NEUROLOGIC SYMPTOMS: "Have you had any of the following symptoms: headache, dizziness, vision loss, double vision, changes in speech, unsteady on your feet?"     Headaches different than my migraines, I'm also having my migraines too.  I'm unstable with my walking intermittently.   The dizziness also comes and goes.   No vision changes.   Speech difficulty and trouble stuttering her words.    Having neck pain.   The BP fluctuates mostly high than low.   The adrenalin surges.  8. PREGNANCY: "Is there any chance you are pregnant?" "When was your last menstrual period?"     N/A Husband said they don't really do anything in the ED.   We prefer she be seen by Dr. Brita Romp.   She is familiar with me. Pt has trigeminal neuralgia.   The speech difficulty comes from that.  Protocols used: Neurologic Deficit-A-AH  I called into [facility] and spoke with [facility], clinical person.   I let her know the situation that pt really did not want to go to the ED because the ED dr doesn't know all her issues.  Dr. Brita Romp is leaving the office early today and is running behind seeing pts now.  She won't be in the office tomorrow.   I asked if there was anyway Dr. Brita Romp could call this pt before she left today and touch base with them since she is familiar with this pt and her issues.  Oleia asked that I send my notes over and she would give the message to Dr. Brita Romp  but didn't know if she would have time to call the pt back or not.  It was still recommended that the pt go to the ED to rule out a stroke or anything else going on.  I got back on the line with the pt and let her know that Dr. Brita Romp was leaving the office early but I had put in a request asking to see if Dr. Brita Romp could call pt before leaving the office, if possible and that she would not be in the office tomorrow.  I still recommended they go to the ED because without further testing there wasn't a way to determine if she was having a stroke or symptoms of her other issues mentioned above.   Pt did not say if she would go to the ED or not and thanked me for my help and for putting in the request to Dr. Brita Romp.

## 2021-07-02 NOTE — ED Provider Notes (Signed)
[name] Emergency Department Provider Note  Time seen: 5:19 PM  I have reviewed the triage vital signs and the nursing notes.   HISTORY  Chief Complaint Aphasia   HPI Autumn Zhang is a 50 y.o. female with a past medical history of anxiety, anemia, ADHD, dysautonomia with intermittent tachycardia, Ehlers-Danlos, gastric reflux, mast cell activation syndrome, presents to the emergency department for speech difficulties.  According to the patient at times she will have difficulty with her speech or finishing sentences, she states she most notices this during times of extreme fatigue or stress.  She states this morning however she was having significant difficulty more so than she is experienced in the past.  Patient states she was having trouble finishing a sentence.  However patient states since arriving to the emergency department her symptoms have completely resolved.  States she is thinking clearly with no speech difficulties.  Patient denies any weakness or numbness of any arm or leg at any point.  Denies any chest pain abdominal pain, recent fever cough or congestion.   Past Medical History:  Diagnosis Date   Allergy    Anemia    Anxiety    Arthritis    Asthma    Attention deficit hyperactivity disorder (ADHD), combined type 10/18/2015   Diagnosed many years ago.  Has been doing well on Ritalin $RemoveBe'5mg'GKlOhlFHv$  QID to six times a day. Long acting hasn't worked well in the past and she has not wanted to try Adderall. She is taking wellbutrin as well, but she has been weaning medication due to SE migraine.  She is now down to Wellbutrin one quarter tablet daily.  Last Assessment & Plan:  A/P: Stable.  Continue current dosing.  Refilled 3 months   Basal cell carcinoma    BRCA negative 09/2018   2015 [place] at [facility]; has VUS; 1/20 MyRisk neg except RPS20 VUS; IBIS=10.8%/riskscore=12%   Colon cancer ([place])    Dysautonomia ([place])    Ehlers-Danlos syndrome type III     Family history of colon cancer    GERD (gastroesophageal reflux disease)    Hyperlipidemia    Idiopathic small fiber peripheral neuropathy    Leiomyoma    Mast cell activation ([facility])    Migraines    Neck pain    Occipital neuralgia    Sleep apnea     Patient Active Problem List   Diagnosis Date Noted   Palpitations 06/11/2021   Axillary lymphadenopathy 06/11/2021   History of seronegative inflammatory arthritis 06/28/2020   Osteopenia of multiple sites 06/14/2020   Psoriatic arthritis ([place]) 06/12/2020   Iodine deficiency 06/12/2020   OSA on CPAP 06/12/2020   Chronic pain syndrome 06/12/2020   Psychophysiological insomnia 11/26/2019   Calcific tendinitis of right shoulder 04/10/2019   History of iron deficiency anemia 11/04/2018   History of basal cell carcinoma 10/07/2018   Vaginal dryness 09/07/2018   Chronic migraine with aura 07/15/2018   Irritable bowel syndrome with diarrhea 07/15/2018   Rectocele 05/21/2018   Ehlers-Danlos syndrome 03/15/2018   Patellar instability of left knee 03/15/2018   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   Instability of right shoulder joint 01/12/2018   Trigeminal neuralgia 12/25/2017   Instability of left shoulder joint 12/25/2017   Nausea 12/25/2017   Occipital neuralgia 12/25/2017   Dysautonomia ([place]) 12/25/2017   Hiatal hernia 10/14/2017   TMJ (dislocation of temporomandibular joint) 09/29/2017   Pulsatile tinnitus of left ear 12/12/2016   Restless leg syndrome 08/01/2016   Cervical  dystonia 03/21/2016   Cervical spine disease 03/21/2016   Paresthesia 02/06/2016   Hypercholesterolemia 12/24/2015   Iron deficiency anemia 10/18/2015   Vitamin D deficiency disease 10/18/2015   Midline low back pain without sciatica 02/06/2015   Hyperglycemia 10/30/2014   History of colon cancer 09/27/2014   POTS (postural orthostatic tachycardia syndrome) 09/16/2003   Chronic fatigue 09/15/2000   Asthma 09/15/1988   Dyspnea 09/15/1988     Past Surgical History:  Procedure Laterality Date   APPENDECTOMY     removed with colectomy   BREAST SURGERY     reduction   COLON SURGERY  2008   for colon cancer, ~1/3 of colon removed, primary reanastimosis   COLONOSCOPY WITH PROPOFOL N/A 10/07/2018   Procedure: COLONOSCOPY WITH PROPOFOL;  Surgeon: Lin Landsman, MD;  Location: [place];  Service: Gastroenterology;  Laterality: N/A;   ENDOMETRIAL ABLATION      Prior to Admission medications   Medication Sig Start Date End Date Taking? Authorizing Provider  acetaminophen (TYLENOL) 500 MG tablet Take 500 mg by mouth every 6 (six) hours as needed.    [provider]  albuterol (PROVENTIL HFA;VENTOLIN HFA) 108 (90 Base) MCG/ACT inhaler Inhale 2 puffs into the lungs every 4 (four) hours as needed.  09/19/16   [provider]  amphetamine-dextroamphetamine (ADDERALL) 5 MG tablet Take by mouth daily. $RemoveBefo'5mg'ZazhpxYvxnn$  in the morning and 1/2 tablet later in the morning    [provider]  Ascorbic Acid (VITAMIN C) 1000 MG tablet Take 1,000 mg by mouth daily. probably total is 900 mg    [provider]  aspirin 325 MG tablet Take by mouth.    [provider]  augmented betamethasone dipropionate (DIPROLENE-AF) 0.05 % cream APPLY TWICE DAILY AS NEEDED TO THE AFFECTED AREAS FOR 5-7 DAYS FOR ECZEMA. 10/22/16   [provider]  b complex vitamins capsule Take 1 capsule by mouth daily. 1/4 capsule a day    [provider]  BELSOMRA 5 MG TABS Take 1 tablet by mouth at bedtime as needed.  01/24/19   [provider]  Blood Pressure Monitoring (BLOOD PRESSURE CUFF) MISC Take blood pressure as needed for symptoms. 06/07/18   [provider]  Boswellia Serrata (BOSWELLIA PO) Take 150 mg by mouth every morning.     [provider]  bupivacaine-EPINEPHrine (MARCAINE W/ EPI) 0.5% -1:200000 SOLN by Infiltration route as needed.    [provider]  Calcium Citrate 200  MG TABS Take 600 mg by mouth.    [provider]  cromolyn (GASTROCROM) 100 MG/5ML solution Take by mouth 4 (four) times daily -  before meals and at bedtime.    [provider]  Cyanocobalamin (VITAMIN B-12) 2500 MCG SUBL Place under the tongue.    [provider]  desloratadine (CLARINEX REDITAB) 5 MG disintegrating tablet Take by mouth. 10/28/20   [provider]  Diclofenac Sodium (ASPERCREME ARTHRITIS PAIN EX) Apply topically as needed.    [provider]  diphenhydrAMINE (BENADRYL) 12.5 MG/5ML liquid Take by mouth 4 (four) times daily as needed.    [provider]  diphenhydrAMINE-Allantoin 2-0.5 % CREA Apply 1 application topically as needed.     [provider]  docusate sodium (COLACE) 100 MG capsule Take 100 mg by mouth every other day.     [provider]  econazole nitrate 1 % cream APPLY TO FEET TWICE A DAY FOR 2 4 WEEKS AS NEEDED FOR FLARE 04/06/19   [provider]  EPINEPHrine 0.3 mg/0.3 mL IJ SOAJ injection INJECT ONE AUTO-INJECTOR INTRAMUSCULARLY AS NEEDED 03/09/18   [provider]  estradiol (ESTRACE) 0.1 MG/GM vaginal cream INSERT 1 GRAM VAGINALLY    TWICE WEEKLY 09/16/56   Copland, Alicia B, PA-C  famotidine (PEPCID) 10 MG tablet Take 10 mg by mouth daily.     [provider]  fexofenadine (ALLEGRA) 180 MG tablet Take 180 mg by mouth daily as needed.     [provider]  guaifenesin (HUMIBID E) 400 MG TABS tablet Take 400 mg by mouth daily.     [provider]  hydrOXYzine (ATARAX/VISTARIL) 50 MG tablet Take 25 mg by mouth as needed.    [provider]  IODINE EX Apply 225 mcg topically. Every 3 days    [provider]  KETAMINE HCL SL Place into right nostril as needed.    [provider]  ketotifen (ZADITOR) 0.025 % ophthalmic solution Place 1 drop into both eyes daily as needed. 10/29/18   [provider]  Ketotifen Fumarate  POWD Take 1 mg by mouth in the morning and at bedtime. If needed 2 more time a day for total of 4 06/21/19   [provider]  L-Lysine 500 MG CAPS Take by mouth 2 (two) times daily.    [provider]  L-Theanine 100 MG CAPS Take 200 mg by mouth daily.     [provider]  levocetirizine Harlow Ohms) 2.5 MG/5ML solution  04/11/21   [provider]  Light Mineral Oil-Mineral Oil 0.5-0.5 % EMUL Apply 1 application to eye daily.    [provider]  lipase/protease/amylase (CREON) 12000-38000 units CPEP capsule Take 12,000 Units by mouth as directed.    [provider]  loperamide (IMODIUM) 2 MG capsule Take by mouth as needed for diarrhea or loose stools.    [provider]  Magnesium Citrate POWD Take 435 mg by mouth daily. 01/11/19   [provider]  Melatonin 1 MG/ML LIQD Take by mouth daily.  10/16/17   [provider]  montelukast (SINGULAIR) 5 MG chewable tablet  04/11/21   [provider]  naproxen (NAPROSYN) 250 MG tablet Take 250 mg by mouth 3 days.    [provider]  Nerve Stimulator (CEFALY KIT) DEVI  09/06/18   [provider]  Non Gelatin Capsules, Empty, (CAPSULE #3 CLEAR/CLEAR VEG) CAPS Take 1 capsule by mouth daily. 11/16/19   [provider]  omega-3 acid ethyl esters (LOVAZA) 1 g capsule Take 2 capsules (2 g total) by mouth 2 (two) times daily. 08/02/20   Virginia Crews, MD  polyethylene glycol (MIRALAX / Floria Raveling) packet Take 17 g by mouth daily as needed.     [provider]  pseudoephedrine (SUDAFED) 30 MG tablet Take 30 mg by mouth every 4 (four) hours as needed for congestion.     [provider]  Quercetin 500 MG CAPS Take by mouth daily.    [provider]  RESTASIS 0.05 % ophthalmic emulsion INSTILL 1 DROP INTO BOTH EYES TWICE A DAY 03/19/21   Jerrol Banana., MD  Riboflavin (VITAMIN B-2 PO) Take 100 mg by mouth 3 (three) times daily.     [provider]  Rimegepant Sulfate (NURTEC) 75 MG TBDP Take 1 tablet by mouth as needed. 10/04/20   [provider]  Secukinumab (COSENTYX ) Inject into the skin. Every 28days    [provider]  simethicone (MYLICON) 80 MG chewable tablet Chew 80  mg by mouth every 6 (six) hours as needed for flatulence.    [provider]  Sodium Fluoride 1.1 % PSTE Place onto teeth.    [provider]  TRIAMCINOLONE ACETONIDE,NASAL, NA Place into the nose as needed.    [provider]  UBRELVY 50 MG TABS TAKE 1 TABLET BY MOUTH ONCE AS NEEDED FOR UP TO 1 DOSE 12/14/18   [provider]  Vitamin D, Cholecalciferol, 10 MCG (400 UNIT) TABS Take by mouth daily.     [provider]    Allergies  Allergen Reactions   Iodinated Diagnostic Agents Hives, Itching, Palpitations, Photosensitivity and Shortness Of Breath    Other reaction(s): Headache   Ioversol Anxiety, Hives, Itching and Palpitations   Ciprofloxacin Other (Autumn Comments)   Eggs Or Egg-Derived Products Other (Autumn Comments)    Exacerbates neuralgia symptoms Exacerbates neuralgia symptoms Congestion & Headaches Exacerbates neuralgia symptoms Congestion & Headaches    Ginger Other (Autumn Comments)    Congestion & Headaches Congestion & Headaches    Levofloxacin Other (Autumn Comments)    Congestion & Headaches Congestion & Headaches    Other Other (Autumn Comments)    Novocaine (IV) form Hearing loss Novocaine (IV) form Hearing loss  Congestion & Headaches Congestion & Headaches Congestion & Headaches Congestion & Headaches  Other reaction(s): Unknown   Soybean-Containing Drug Products Other (Autumn Comments)    Congestion & Headaches    Bee Pollen    Bupropion    Celebrex [Celecoxib]    Codeine    Dexilant [Dexlansoprazole]    Dulera [Mometasone Furo-Formoterol Fum]    Fentanyl    Fluticasone    Gabapentin    Gluten Meal    Iodine    Lidocaine     Rash to  topical cream   Lyrica [Pregabalin]    Mold Extract [Trichophyton]    Morphine And Related    Penicillins     Skin testing positive for allergy   Pollen Extract    Prilosec [Omeprazole]    Rizatriptan Other (Autumn Comments)    Dizziness, headache, myalgias, nausea   Salmeterol Xinafoate    Silenor [Doxepin Hcl]    Stevioside Other (Autumn Comments)   Tomato    Xylitol Other (Autumn Comments)   Ethanol Dermatitis, Other (Autumn Comments), Itching and Palpitations   Hydrocodone Nausea Only   Ibuprofen Nausea Only    At high doses At high doses    Lac Bovis Diarrhea    Facial burning sensation Facial burning sensation Facial burning sensation Facial burning sensation    Lanolin Other (Autumn Comments)    Exacerbates neuralgia symptoms Wool causes hives.    Milk-Related Compounds Diarrhea    Facial burning sensation    Wheat Bran Diarrhea   Yeast-Related Products Other (Autumn Comments)    Headache, flushing    Family History  Problem Relation Age of Onset   Colon cancer Father 91   Breast cancer Maternal Aunt 41       BRCA neg   Endometrial cancer Maternal Aunt 50   Colon cancer Maternal Grandmother 10   Migraines Maternal Grandmother    Tremor Maternal Grandmother    Leukemia Maternal Grandfather 108   Food Allergy Maternal Grandfather    Migraines Mother    Glaucoma Mother    Cataracts Mother    Tremor Mother    Fibromyalgia Mother    Heart disease Mother    Colon polyps Brother        non-cancerous    Social  History Social History   Tobacco Use   Smoking status: Never   Smokeless tobacco: Never  Vaping Use   Vaping Use: Never used  Substance Use Topics   Alcohol use: Never   Drug use: Never    Review of Systems Constitutional: Negative for fever. Cardiovascular: Negative for chest pain. Respiratory: Negative for shortness of breath. Gastrointestinal: Negative for abdominal pain, vomiting  Musculoskeletal: Negative for musculoskeletal  complaints Neurological: Negative for headache All other ROS negative  ____________________________________________   PHYSICAL EXAM:  VITAL SIGNS: ED Triage Vitals  Enc Vitals Group     BP 07/02/21 1339 (!) 146/86     Pulse Rate 07/02/21 1339 90     Resp 07/02/21 1339 15     Temp 07/02/21 1339 (!) 97.3 F (36.3 C)     Temp Source 07/02/21 1339 Axillary     SpO2 07/02/21 1339 99 %     Weight 07/02/21 1342 147 lb (66.7 kg)     Height 07/02/21 1342 $RemoveBefor'5\' 2"'XIkZCdhMAnza$  (1.575 m)     Head Circumference --      Peak Flow --      Pain Score 07/02/21 1340 6     Pain Loc --      Pain Edu? --      Excl. in [place]? --    Constitutional: Alert and oriented. Well appearing and in no distress. Eyes: Normal exam ENT      Head: Normocephalic and atraumatic.      Mouth/Throat: Mucous membranes are moist. Cardiovascular: Normal rate, regular rhythm. Respiratory: Normal respiratory effort without tachypnea nor retractions. Breath sounds are clear  Gastrointestinal: Soft and nontender. No distention.  Musculoskeletal: Nontender with normal range of motion in all extremities. Neurologic:  Normal speech and language. No gross focal neurologic deficits.  Equal grip strength.  No pronator drift. Skin:  Skin is warm, dry and intact.  Psychiatric: Mood and affect are normal.   ____________________________________________   RADIOLOGY  CT scan of the head is negative for acute abnormality  ____________________________________________   INITIAL IMPRESSION / ASSESSMENT AND PLAN / ED COURSE  Pertinent labs & imaging results that were available during my care of the patient were reviewed by me and considered in my medical decision making (Autumn chart for details).   Patient presents emergency department for speech difficulties.  According to the patient she will intermittently have difficulty with her speech mostly during times of stress or fatigue.  She states today she was having more significant difficulty  with her speech however since arrival to the emergency department she feels like her symptoms have completely resolved.  Patient states she was started on a new medication by her cardiologist 10 days ago, they did speak to them on the phone today who noted that this could be a side effect from the new medication which they are going to consider discontinuing.  During my evaluation patient appears well, clear speech and thought process.  No notable physical exam or neurological exam findings. We will proceed with MRI.  If MRI is negative anticipate likely discharge home with PCP follow-up.  If positive we will admit to the hospital service for further work-up and treatment.  Patient agreeable to plan of care.  MRI of the brain is negative.  Patient reassured we will discharge home with PCP follow-up.  Patient denies any symptoms currently.  Autumn Zhang was evaluated in Emergency Department on 07/02/2021 for the symptoms described in the history of present illness. She was evaluated  in the context of the global COVID-19 pandemic, which necessitated consideration that the patient might be at risk for infection with the SARS-CoV-2 virus that causes COVID-19. Institutional protocols and algorithms that pertain to the evaluation of patients at risk for COVID-19 are in a state of rapid change based on information released by regulatory bodies including the CDC and federal and state organizations. These policies and algorithms were followed during the patient's care in the ED.  ____________________________________________   FINAL CLINICAL IMPRESSION(S) / ED DIAGNOSES  Aphasia   Harvest Dark, MD 07/02/21 [phone]

## 2021-07-02 NOTE — ED Notes (Signed)
Pt taken to CT at this time.

## 2021-07-02 NOTE — Discharge Instructions (Addendum)
As we discussed your work-up today in the emergency department is reassuring including a normal MRI of the brain.  Please follow-up with your doctor in the next 2 to 3 days for recheck/reevaluation.  Return to the emergency department immediately if you have any difficulty speaking thinking, develop any numbness or weakness of any arm or leg, or any other symptom personally concerning to yourself.

## 2021-07-02 NOTE — ED Triage Notes (Signed)
Pt presents to ED with Husband for c/o aphasia & subjective tachycardia that started around 08:45a. Pt st she is having trouble getting words out and has been having neuro/cardio issues r/t dysautonomia for the last few months. Pt A&Ox4. Pt also reports difficulty in fine & gross motor skills that is different from baseline.  Pt st that if she is really tired she has trouble finding words but today its worse than baseline - sx come and go.

## 2021-07-02 NOTE — ED Provider Notes (Signed)
Emergency Medicine Provider Triage Evaluation Note  Autumn Zhang, a 50 y.o. female  was evaluated in triage.  Pt complains of aphasia and palpitations.  Patient presents with her husband, who notes that the patient has been having trouble getting words out as of late.  She has been experiencing difficulty with fine and gross motor skills.  She notes her symptoms are aggravated by fatigue.  She denies any recent head injury, syncope, or paralysis.  Review of Systems  Positive: Aphasia, motor skill changes Negative: Head injury  Physical Exam  BP (!) 146/86 (BP Location: Left Arm)   Pulse 90   Temp (!) 97.3 F (36.3 C) (Axillary)   Resp 15   Ht 5\' 2"  (1.575 m)   Wt 66.7 kg   SpO2 99%   BMI 26.89 kg/m  Gen:   Awake, no distress  NAD Resp:  Normal effort CTA MSK:   Moves extremities without difficulty  Other:   CVS: RRR  Medical Decision Making  Medically screening exam initiated at 2:02 PM.  Appropriate orders placed.  Autumn Zhang was informed that the remainder of the evaluation will be completed by another provider, this initial triage assessment does not replace that evaluation, and the importance of remaining in the ED until their evaluation is complete.  Presents to the ED, by her husband, for evaluation of aphasia and some difficulty with gross motor skills.   [name], PA-C 07/02/21 1405    [name], MD 07/03/21 1159

## 2021-07-03 ENCOUNTER — Encounter: Payer: Self-pay | Admitting: Family Medicine

## 2021-07-04 NOTE — Telephone Encounter (Signed)
Patient responded through [employer].

## 2021-07-05 ENCOUNTER — Ambulatory Visit: Payer: Self-pay

## 2021-07-05 ENCOUNTER — Ambulatory Visit
Admission: RE | Admit: 2021-07-05 | Discharge: 2021-07-05 | Disposition: A | Discharge status: Home / Self Care | Payer: Medicare Other | Source: Ambulatory Visit | Attending: Physician Assistant | Admitting: Physician Assistant

## 2021-07-05 ENCOUNTER — Ambulatory Visit (INDEPENDENT_AMBULATORY_CARE_PROVIDER_SITE_OTHER): Payer: Medicare Other | Admitting: Physician Assistant

## 2021-07-05 ENCOUNTER — Encounter: Payer: Self-pay | Admitting: Physician Assistant

## 2021-07-05 ENCOUNTER — Other Ambulatory Visit: Payer: Self-pay

## 2021-07-05 VITALS — BP 119/77 | HR 80 | Temp 98.7°F | Ht 62.0 in | Wt 146.6 lb

## 2021-07-05 DIAGNOSIS — R1032 Left lower quadrant pain: Secondary | ICD-10-CM | POA: Insufficient documentation

## 2021-07-05 DIAGNOSIS — R6884 Jaw pain: Secondary | ICD-10-CM

## 2021-07-05 DIAGNOSIS — Q796 Ehlers-Danlos syndrome, unspecified: Secondary | ICD-10-CM

## 2021-07-05 MED ORDER — PREDNISONE 50 MG PO TABS: ORAL_TABLET | ORAL | 0 refills | Status: DC

## 2021-07-05 MED ORDER — DIPHENHYDRAMINE HCL 50 MG PO TABS: ORAL_TABLET | ORAL | 0 refills | Status: DC

## 2021-07-05 NOTE — Progress Notes (Signed)
Established patient visit   Patient: Autumn Zhang   DOB: 1970-10-25   50 y.o. Female  MRN: [id number] Visit Date: 07/05/2021  Today's healthcare provider: Mikey Kirschner, PA-C   Chief Complaint  Patient presents with   Abdominal Pain    Left   Subjective    HPI HPI     Abdominal Pain    Additional comments: Left      Last edited by Beverlee Nims, CMA on 07/05/2021  1:38 PM.      Autumn Zhang is a 50 y/o female who presents today for evaluation of left lower abdominal pain x 4 days. Reports one episode of diarrhea on Tuesday, but since then only small BM, denies bloody BM. She is passing a small amount of gas. This pain is primarily in the left upper and lower quadrants, painful at rest but very tender to touch especially left lower quadrant. History of right colon cancer and previous bowel obstructions. Describes her pain at rest as a 6/10 and 8/10 with pressure.   She also reports suffering from aphasia currently, she was evaluated in the ED 10/18 to r/I TIA/stroke, all imaging negative. 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 This current flare is severe, and she has severe pain at her L TMJ. She currently wear a compression band which helps with pain. Unable to chew, on a liquid diet currently. Denies HA, vision changes, weakness.    Medications: Outpatient Medications Prior to Visit  Medication Sig   acetaminophen (TYLENOL) 500 MG tablet Take 500 mg by mouth every 6 (six) hours as needed.   albuterol (PROVENTIL HFA;VENTOLIN HFA) 108 (90 Base) MCG/ACT inhaler Inhale 2 puffs into the lungs every 4 (four) hours as needed.    Ascorbic Acid (VITAMIN C) 1000 MG tablet Take 1,000 mg by mouth daily. probably total is 900 mg   aspirin 325 MG tablet Take by mouth.   augmented betamethasone dipropionate (DIPROLENE-AF) 0.05 % cream APPLY TWICE DAILY AS NEEDED TO THE AFFECTED AREAS FOR 5-7 DAYS FOR  ECZEMA.   b complex vitamins capsule Take 1 capsule by mouth daily. 1/4 capsule a day   BELSOMRA 5 MG TABS Take 1 tablet by mouth at bedtime as needed.    Blood Pressure Monitoring (BLOOD PRESSURE CUFF) MISC Take blood pressure as needed for symptoms.   Boswellia Serrata (BOSWELLIA PO) Take 150 mg by mouth every morning.    bupivacaine-EPINEPHrine (MARCAINE W/ EPI) 0.5% -1:200000 SOLN by Infiltration route as needed.   Calcium Citrate 200 MG TABS Take 600 mg by mouth.   cromolyn (GASTROCROM) 100 MG/5ML solution Take by mouth 4 (four) times daily -  before meals and at bedtime.   Cyanocobalamin (VITAMIN B-12) 2500 MCG SUBL Place under the tongue.   desloratadine (CLARINEX REDITAB) 5 MG disintegrating tablet Take by mouth.   Diclofenac Sodium (ASPERCREME ARTHRITIS PAIN EX) Apply topically as needed.   diphenhydrAMINE (BENADRYL) 12.5 MG/5ML liquid Take by mouth 4 (four) times daily as needed.   diphenhydrAMINE-Allantoin 2-0.5 % CREA Apply 1 application topically as needed.    docusate sodium (COLACE) 100 MG capsule Take 100 mg by mouth every other day.    econazole nitrate 1 % cream APPLY TO FEET TWICE A DAY FOR 2 4 WEEKS AS NEEDED FOR FLARE   EPINEPHrine 0.3 mg/0.3 mL IJ SOAJ injection INJECT ONE AUTO-INJECTOR INTRAMUSCULARLY AS NEEDED   estradiol (ESTRACE) 0.1 MG/GM vaginal cream  INSERT 1 GRAM VAGINALLY    TWICE WEEKLY   famotidine (PEPCID) 10 MG tablet Take 10 mg by mouth daily.    fexofenadine (ALLEGRA) 180 MG tablet Take 180 mg by mouth daily as needed.    guaifenesin (HUMIBID E) 400 MG TABS tablet Take 400 mg by mouth daily.    hydrOXYzine (ATARAX/VISTARIL) 50 MG tablet Take 25 mg by mouth as needed.   IODINE EX Apply 225 mcg topically. Every 3 days   KETAMINE HCL SL Place into right nostril as needed.   ketotifen (ZADITOR) 0.025 % ophthalmic solution Place 1 drop into both eyes daily as needed.   Ketotifen Fumarate POWD Take 1 mg by mouth in the morning and at bedtime. If needed 2 more  time a day for total of 4   L-Lysine 500 MG CAPS Take by mouth 2 (two) times daily.   L-Theanine 100 MG CAPS Take 200 mg by mouth daily.    levocetirizine (XYZAL) 2.5 MG/5ML solution    Light Mineral Oil-Mineral Oil 0.5-0.5 % EMUL Apply 1 application to eye daily.   lipase/protease/amylase (CREON) 12000-38000 units CPEP capsule Take 12,000 Units by mouth as directed.   loperamide (IMODIUM) 2 MG capsule Take by mouth as needed for diarrhea or loose stools.   Magnesium Citrate POWD Take 435 mg by mouth daily.   Melatonin 1 MG/ML LIQD Take by mouth daily.    montelukast (SINGULAIR) 5 MG chewable tablet    naproxen (NAPROSYN) 250 MG tablet Take 250 mg by mouth 3 days.   Nerve Stimulator (CEFALY KIT) DEVI    Non Gelatin Capsules, Empty, (CAPSULE #3 CLEAR/CLEAR VEG) CAPS Take 1 capsule by mouth daily.   omega-3 acid ethyl esters (LOVAZA) 1 g capsule Take 2 capsules (2 g total) by mouth 2 (two) times daily.   polyethylene glycol (MIRALAX / GLYCOLAX) packet Take 17 g by mouth daily as needed.    pseudoephedrine (SUDAFED) 30 MG tablet Take 30 mg by mouth every 4 (four) hours as needed for congestion.    pyridostigmine (MESTINON) 60 MG tablet Take 15 mg by mouth in the morning and at bedtime.   Quercetin 500 MG CAPS Take by mouth daily.   RESTASIS 0.05 % ophthalmic emulsion INSTILL 1 DROP INTO BOTH EYES TWICE A DAY   Riboflavin (VITAMIN B-2 PO) Take 100 mg by mouth 3 (three) times daily.   Rimegepant Sulfate (NURTEC) 75 MG TBDP Take 1 tablet by mouth as needed.   Secukinumab (COSENTYX [place]) Inject into the skin. Every 28days   simethicone (MYLICON) 80 MG chewable tablet Chew 80 mg by mouth every 6 (six) hours as needed for flatulence.   Sodium Fluoride 1.1 % PSTE Place onto teeth.   TRIAMCINOLONE ACETONIDE,NASAL, NA Place into the nose as needed.   UBRELVY 50 MG TABS TAKE 1 TABLET BY MOUTH ONCE AS NEEDED FOR UP TO 1 DOSE   Vitamin D, Cholecalciferol, 10 MCG (400 UNIT) TABS Take by mouth daily.     [DISCONTINUED] amphetamine-dextroamphetamine (ADDERALL) 5 MG tablet Take by mouth daily. 58m in the morning and 1/2 tablet later in the morning   No facility-administered medications prior to visit.    Review of Systems  Constitutional:  Positive for fatigue. Negative for fever.  HENT:  Positive for trouble swallowing and voice change.   Respiratory:  Negative for chest tightness and shortness of breath.   Cardiovascular:  Negative for chest pain.  Gastrointestinal:  Positive for abdominal distention, abdominal pain and diarrhea. Negative for blood in  stool and constipation.  Genitourinary:  Negative for dysuria.  Neurological:  Negative for dizziness.     Objective    Blood pressure 119/77, pulse 80, temperature 98.7 F (37.1 C), temperature source Oral, height _0  (1.575 m), weight 146 lb 9.6 oz (66.5 kg), SpO2 98 %.   Physical Exam Constitutional:      General: She is awake.     Comments: Pt has to stand in a certain position to have her jaw allow her to speak without aphasia.   HENT:     Head:     Jaw: Tenderness and pain on movement present.     Comments: L TMJ with acute tenderness to palpation, and unable to move without pain. Cardiovascular:     Rate and Rhythm: Normal rate and regular rhythm.  Pulmonary:     Effort: Pulmonary effort is normal.  Abdominal:     General: Abdomen is protuberant. Bowel sounds are increased.     Palpations: Abdomen is soft. There is no mass.     Tenderness: There is abdominal tenderness in the left upper quadrant and left lower quadrant. There is guarding. There is no rebound.  Psychiatric:        Behavior: Behavior is cooperative.    No results found for any visits on 07/05/21.  Assessment & Plan     Abdominal pain Due to history of colon cancer and bowel obstruction, CT w/ po barium contrast ordered. Advised continue having small meals and drinking fluids. If she can no longer have BM or is no longer able to pass gas, any episodes  of N/V, please go to ED.  Problem List Items Addressed This Visit       Musculoskeletal and Integument   Ehlers-Danlos syndrome   Relevant Orders   DG Mandible 4 Views     Other   Jaw pain    Chronic instability L TMJ d/t EDS, acute flare causing dysphagia. Acutely tender L TMJ unable to evaluate malocclusion d/t severe pain.  Rx mandibular imaging with closed and open mouth      Relevant Orders   DG Mandible 4 Views   Left lower quadrant abdominal pain - Primary   Relevant Orders   CT Abdomen Pelvis Wo Contrast   Other Visit Diagnoses     Adverse effect of contrast media, initial encounter       Relevant Medications   predniSONE (DELTASONE) 50 MG tablet   diphenhydrAMINE (BENADRYL) 50 MG tablet       Return if symptoms worsen or fail to improve.      I, Mikey Kirschner, PA-C have reviewed all documentation for this visit. The documentation on 07/05/2021 for the exam, diagnosis, procedures, and orders are all accurate and complete.     Mikey Kirschner, PA-C  [facility] [phone] (phone) [phone] (fax)  [place]

## 2021-07-05 NOTE — Telephone Encounter (Signed)
Noted  

## 2021-07-05 NOTE — Assessment & Plan Note (Signed)
Chronic instability L TMJ d/t EDS, acute flare causing dysphagia. Acutely tender L TMJ unable to evaluate malocclusion d/t severe pain.  Rx mandibular imaging with closed and open mouth

## 2021-07-05 NOTE — Telephone Encounter (Signed)
Pt called in reporting she has lower left quadrant pain that has been present since 07/02/21. She states that the pain is constant and pain level around 8/10 with movement or touch and 5-6 when resting. She has been taking tylenol for the pain but d/t her having issues with taking medications she has to watch how much she takes. She stated that she has a neurological condition going on with her feet and unsure if that's causing the pain or if her colon cancer is coming back. She says she had colon cancer several years ago that was on right side but experienced left sided pain. That pain is similar to this pain but not as bad. Advised pt she would need to schedule appt to come in for evaluation. Unable to schedule appt d/t being far out so called office and spoke with Arbie Cookey, [facility] who provided me with appt for today at 1340 with Mikey Kirschner, PA. Pt agreed to take that appt. Care advice was given and pt verbalized understanding and was grateful for the assistance.   Reason for Disposition  [1] MILD-MODERATE pain AND [2] constant AND [3] present > 2 hours  Answer Assessment - Initial Assessment Questions 1. LOCATION: "Where does it hurt?"      Lower left quadrant 2. RADIATION: "Does the pain shoot anywhere else?" (e.g., chest, back)     unsure 3. ONSET: "When did the pain begin?" (e.g., minutes, hours or days ago)      07/02/21 4. SUDDEN: "Gradual or sudden onset?"     sudden 5. PATTERN "Does the pain come and go, or is it constant?"    - If constant: "Is it getting better, staying the same, or worsening?"      (Note: Constant means the pain never goes away completely; most serious pain is constant and it progresses)     - If intermittent: "How long does it last?" "Do you have pain now?"     (Note: Intermittent means the pain goes away completely between bouts)     Constant  6. SEVERITY: "How bad is the pain?"  (e.g., Scale 1-10; mild, moderate, or severe)   - MILD (1-3): doesn't interfere with  normal activities, abdomen soft and not tender to touch    - MODERATE (4-7): interferes with normal activities or awakens from sleep, abdomen tender to touch    - SEVERE (8-10): excruciating pain, doubled over, unable to do any normal activities      When at its worse, 8, present 5-6 7. RECURRENT SYMPTOM: "Have you ever had this type of stomach pain before?" If Yes, ask: "When was the last time?" and "What happened that time?"      Similar to colon cancer pain, not as bad  8. CAUSE: "What do you think is causing the stomach pain?"     unsure 9. RELIEVING/AGGRAVATING FACTORS: "What makes it better or worse?" (e.g., movement, antacids, bowel movement)     Tylenol helps, movement or touches area gets worse  10. OTHER SYMPTOMS: "Do you have any other symptoms?" (e.g., back pain, diarrhea, fever, urination pain, vomiting)       Pain with bowel movement, bad diarrhea 2 nights ago, constipated now  11. PREGNANCY: "Is there any chance you are pregnant?" "When was your last menstrual period?"       No  Protocols used: Abdominal Pain - [facility]

## 2021-07-05 NOTE — Patient Instructions (Signed)
Will f/u with imaging Pt set up with neurologist and other EDS specialists for care.

## 2021-07-08 ENCOUNTER — Other Ambulatory Visit: Payer: Self-pay | Admitting: Physician Assistant

## 2021-07-08 ENCOUNTER — Encounter: Payer: Self-pay | Admitting: Family Medicine

## 2021-07-08 DIAGNOSIS — R932 Abnormal findings on diagnostic imaging of liver and biliary tract: Secondary | ICD-10-CM

## 2021-07-08 NOTE — Progress Notes (Signed)
Spoke w/ pt 10/21 regarding indeterminate finding on liver on recent ct scan--recommended liver US, non emergent.

## 2021-07-11 NOTE — Telephone Encounter (Signed)
Please update vaccines

## 2021-07-15 ENCOUNTER — Other Ambulatory Visit: Payer: Self-pay

## 2021-07-15 DIAGNOSIS — H04129 Dry eye syndrome of unspecified lacrimal gland: Secondary | ICD-10-CM | POA: Insufficient documentation

## 2021-07-16 ENCOUNTER — Other Ambulatory Visit: Payer: Self-pay

## 2021-07-16 ENCOUNTER — Encounter: Payer: Self-pay | Admitting: Oncology

## 2021-07-16 ENCOUNTER — Ambulatory Visit (INDEPENDENT_AMBULATORY_CARE_PROVIDER_SITE_OTHER): Payer: Medicare Other | Admitting: Gastroenterology

## 2021-07-16 ENCOUNTER — Encounter: Payer: Self-pay | Admitting: Gastroenterology

## 2021-07-16 ENCOUNTER — Encounter: Payer: Self-pay | Admitting: Family Medicine

## 2021-07-16 VITALS — BP 109/71 | HR 75 | Temp 98.6°F | Ht 62.0 in | Wt 145.4 lb

## 2021-07-16 DIAGNOSIS — R4701 Aphasia: Secondary | ICD-10-CM

## 2021-07-16 DIAGNOSIS — Z86010 Personal history of colonic polyps: Secondary | ICD-10-CM | POA: Diagnosis not present

## 2021-07-16 DIAGNOSIS — R197 Diarrhea, unspecified: Secondary | ICD-10-CM

## 2021-07-16 DIAGNOSIS — K580 Irritable bowel syndrome with diarrhea: Secondary | ICD-10-CM

## 2021-07-16 MED ORDER — NA SULFATE-K SULFATE-MG SULF 17.5-3.13-1.6 GM/177ML PO SOLN: 1.0000 | Freq: Once | ORAL | 0 refills | Status: AC

## 2021-07-16 MED ORDER — RIFAXIMIN 550 MG PO TABS: 550.0000 mg | ORAL_TABLET | Freq: Three times a day (TID) | ORAL | 0 refills | Status: AC

## 2021-07-16 NOTE — Telephone Encounter (Signed)
Ok to place referral for speech therapy for aphasia

## 2021-07-16 NOTE — Progress Notes (Signed)
[name], MD [address]  [place]  [place], [place] [address]  Main: [phone]  Fax: [phone]    Gastroenterology Consultation  Referring Provider:     Virginia Crews, MD Primary Care Physician:  Virginia Crews, MD Primary Gastroenterologist:  Dr. [name] Reason for Consultation:     Pelvic floor dyssynergia, abdominal bloating, abdominal pain        HPI:   Autumn Zhang is a 50 y.o. Caucasian female referred by Dr. Brita Romp, Dionne Bucy, MD  for consultation & management of constellation of upper and lower GI symptoms.  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  She underwent right hemicolectomy which revealed poorly differentiated invasive adenocarcinoma T3N0. She received 12 cycles of adjuvant chemotherapy with 5-FU leucovorin. 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 her father died of colon cancer at the age of 37.  Her only brother has also had colon polyps.  She has therefore been managed as a Lynch syndrome patient despite not having genetic features suggestive of Lynch syndrome.  She took prophylactic aspirin for a while and then she stopped taking it.  She has been getting colonoscopies every 1 to 2 years.  Her last colonoscopy was in 2018 at outside hospital.  Patient has history of Ehlers-Danlos syndrome.  Currently, patient is going through migraine attack and waiting glasses and he hat.  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  Moved from [place] to [place] and here to establish care for her ongoing GI symptoms.  Previously, she saw Dr. Carlton Adam at [facility], Dr Heath Lark at [facility] in [place].  Her GI symptoms include pelvic floor dyssynergia, chronic constipation although having several bowel movements daily with incomplete emptying, nonbloody, abdominal bloating.   She is diagnosed with mild pelvic floor dyssynergia based on MRI defecography, and mild rectocele at [facility] in 05/2018.  She also underwent smart pill study at [facility] in 08/2018 which revealed normal small bowel transit time.  She underwent pelvic floor PT in [place] and did not find it helpful.  Her most concerning symptom today is frequent soft bowel movements anywhere from 5 to 6/day, nonbloody associated with significant abdominal bloating.  She was told that she had considered based on the imaging studies and therefore tried linaclotide in the past and did not tolerate it well.   She has history of heartburn, epigastric pain, regurgitation which are manageable.  Her weight has been stable.  She underwent nuclear medicine gastric emptying study which was normal in 03/2018.  Patient is also here to discuss about surveillance colonoscopy given her history of colon cancer and treating as Lynch syndrome.  She reports undergoing surveillance EGD in 2016 which was reportedly normal.  Patient reports that she has history of iron deficiency anemia but her most recent labs returned unremarkable including normal iron studies.  She takes iron, B12, vitamin C, calcium and vitamin D.  She has history of mast cell activation syndrome for which she takes cromolyn sodium and scheduled to see allergy immunology at [facility].  Patient reports that she has several intolerances and allergies to medications and is very careful about trying new medication.  Follow-up visit 11/02/2018 She underwent colonoscopy which revealed diminutive tubular adenoma only.  Otherwise it was unremarkable.  Today, she wanted  to discuss about chronic left upper abdominal pain that she has been dealing with for last 2 to 3 years.  She sees eating fatty foods triggers the pain, sometimes laying on the left side makes the pain worse.  She states that is mild discomfort, nagging, more of an annoyance than anything.   The pain does not interfere with her daily life.  She had cross-sectional imaging which was unremarkable.  She also had EGD which was unremarkable.  She would like to know if there is anything that she could try.  She tried Amitiza 8 MCG 2 times a day which resulted in constipation.  She is taking Dulcolax as needed.  She brings in with her a stool diary where she reports that her stools are variable consistency, sometimes dark, restarted oral iron.  Her weight has been stable.  She is currently on amitriptyline 10 mg at bedtime.  She would like to know if she has fat malabsorption because she notices that sometimes her stools float in the toilet bowl and she has significant fat craving.  She said she had pancreatic fecal elastase done in the past and it was negative.  Follow-up video visit 03/04/2019 Natallie reports that she has tried low-dose Creon and Zenpep which resulted in massive reactivation.  Therefore, she stopped taking pancreatic enzymes.  She continues to have mild degree of chronic left upper quadrant discomfort.  Her diet consists of mostly blended, liquid food.  She is trying to eat chicken but not able to tolerate it well.  Her weight has been stable.  She continues to have increased bowel frequency. She is currently on amitriptyline 10 mg daily.  She also has appointment with [facility] that she is planning to keep it.  She did not see the dietitian at [facility] referred by her allergy specialist. 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  She could not open her mouth wide enough to fit even the pediatric bite block.  Follow-up visit 07/16/2021 Patient is here for follow-up of her GI symptoms.  She was followed by Dr. Arvid Right at [facility] for 2 years.  She decided to stay local and made a follow-up appointment to see me today. Patient has stayed on low-dose Creon.  She reports that even though her stools are loose and frequent, she has constant feeling of  incomplete emptying.  She is not able to lose weight even though her calorie restriction is between 1600 to 1700 cal/day.  She reports severe abdominal distention.  She does report left-sided abdominal discomfort.  She also wants to undergo colonoscopy for surveillance of colon cancer.  Her most recent labs are unremarkable  She does not smoke or drink alcohol  NSAIDs: None  Antiplts/Anticoagulants/Anti thrombotics: None  GI Procedures:  Colonoscopy in 2018 by Dr Theodora Blow   Colonoscopy 10/07/2018 - Patent end-to-side ileo-colonic anastomosis, characterized by healthy appearing mucosa. - Two diminutive polyps in the transverse colon, removed with a cold biopsy forceps. Resected and retrieved. - The distal rectum and anal verge are normal on retroflexion view. - The examination was otherwise normal. - The examined portion of the ileum was normal.  DIAGNOSIS:  A.  COLON POLYP X2, TRANSVERSE; COLD BIOPSY:  - TUBULAR ADENOMA (1).  - COLONIC MUCOSA WITH FOCAL LYMPHOID AGGREGATE (1).  - NEGATIVE FOR HIGH-GRADE DYSPLASIA AND MALIGNANCY.   Past Medical History:  Diagnosis Date   Allergy    Anemia    Anxiety  Arthritis    Asthma    Attention deficit hyperactivity disorder (ADHD), combined type 10/18/2015   Diagnosed many years ago.  Has been doing well on Ritalin $RemoveBe'5mg'bpckWexDY$  QID to six times a day. Long acting hasn't worked well in the past and she has not wanted to try Adderall. She is taking wellbutrin as well, but she has been weaning medication due to SE migraine.  She is now down to Wellbutrin one quarter tablet daily.  Last Assessment & Plan:  A/P: Stable.  Continue current dosing.  Refilled 3 months   Basal cell carcinoma    BRCA negative 09/2018   2015 [place] at [facility]; has VUS; 1/20 MyRisk neg except RPS20 VUS; IBIS=10.8%/riskscore=12%   Colon cancer ([place])    Dysautonomia ([place])    Ehlers-Danlos syndrome type III    Family history of colon cancer    GERD (gastroesophageal reflux disease)     Hyperlipidemia    Idiopathic small fiber peripheral neuropathy    Leiomyoma    Mast cell activation ([facility])    Migraines    Neck pain    Occipital neuralgia    Sleep apnea     Past Surgical History:  Procedure Laterality Date   APPENDECTOMY     removed with colectomy   BREAST SURGERY     reduction   COLON SURGERY  2008   for colon cancer, ~1/3 of colon removed, primary reanastimosis   COLONOSCOPY WITH PROPOFOL N/A 10/07/2018   Procedure: COLONOSCOPY WITH PROPOFOL;  Surgeon: Lin Landsman, MD;  Location: [facility] ENDOSCOPY;  Service: Gastroenterology;  Laterality: N/A;   ENDOMETRIAL ABLATION      Current Outpatient Medications:    acetaminophen (TYLENOL) 500 MG tablet, Take 500 mg by mouth every 6 (six) hours as needed., Disp: , Rfl:    albuterol (PROVENTIL HFA;VENTOLIN HFA) 108 (90 Base) MCG/ACT inhaler, Inhale 2 puffs into the lungs every 4 (four) hours as needed. , Disp: , Rfl:    Ascorbic Acid (VITAMIN C) 1000 MG tablet, Take 1,000 mg by mouth daily. probably total is 900 mg, Disp: , Rfl:    aspirin 325 MG tablet, Take by mouth., Disp: , Rfl:    augmented betamethasone dipropionate (DIPROLENE-AF) 0.05 % cream, APPLY TWICE DAILY AS NEEDED TO THE AFFECTED AREAS FOR 5-7 DAYS FOR ECZEMA., Disp: , Rfl:    b complex vitamins capsule, Take 1 capsule by mouth daily. 1/4 capsule a day, Disp: , Rfl:    BELSOMRA 5 MG TABS, Take 1 tablet by mouth at bedtime as needed. , Disp: , Rfl:    Blood Pressure Monitoring (BLOOD PRESSURE CUFF) MISC, Take blood pressure as needed for symptoms., Disp: , Rfl:    Boswellia Serrata (BOSWELLIA PO), Take 150 mg by mouth every morning. , Disp: , Rfl:    Calcium Citrate 200 MG TABS, Take 600 mg by mouth., Disp: , Rfl:    cromolyn (GASTROCROM) 100 MG/5ML solution, Take by mouth 4 (four) times daily -  before meals and at bedtime., Disp: , Rfl:    Cyanocobalamin (VITAMIN B-12) 2500 MCG SUBL, Place under the tongue., Disp: , Rfl:    diphenhydrAMINE (BENADRYL)  12.5 MG/5ML liquid, Take by mouth 4 (four) times daily as needed., Disp: , Rfl:    diphenhydrAMINE-Allantoin 2-0.5 % CREA, Apply 1 application topically as needed. , Disp: , Rfl:    docusate sodium (COLACE) 100 MG capsule, Take 100 mg by mouth every other day. , Disp: , Rfl:    econazole nitrate 1 % cream,  APPLY TO FEET TWICE A DAY FOR 2 4 WEEKS AS NEEDED FOR FLARE, Disp: , Rfl:    EPINEPHrine 0.3 mg/0.3 mL IJ SOAJ injection, INJECT ONE AUTO-INJECTOR INTRAMUSCULARLY AS NEEDED, Disp: , Rfl:    estradiol (ESTRACE) 0.1 MG/GM vaginal cream, INSERT 1 GRAM VAGINALLY    TWICE WEEKLY, Disp: 42.5 g, Rfl: 0   famotidine (PEPCID) 10 MG tablet, Take 10 mg by mouth daily. , Disp: , Rfl:    fexofenadine (ALLEGRA) 180 MG tablet, , Disp: , Rfl:    guaifenesin (HUMIBID E) 400 MG TABS tablet, Take 400 mg by mouth daily. , Disp: , Rfl:    IODINE EX, Apply 225 mcg topically. Every 3 days, Disp: , Rfl:    KETAMINE HCL SL, Place into right nostril as needed., Disp: , Rfl:    ketotifen (ZADITOR) 0.025 % ophthalmic solution, Place 1 drop into both eyes daily as needed., Disp: , Rfl:    L-Lysine 500 MG CAPS, Take by mouth 2 (two) times daily., Disp: , Rfl:    L-Theanine 100 MG CAPS, Take 200 mg by mouth daily. , Disp: , Rfl:    levocetirizine (XYZAL) 2.5 MG/5ML solution, , Disp: , Rfl:    Light Mineral Oil-Mineral Oil 0.5-0.5 % EMUL, Apply 1 application to eye daily., Disp: , Rfl:    lipase/protease/amylase (CREON) 12000-38000 units CPEP capsule, Take 12,000 Units by mouth as directed., Disp: , Rfl:    loperamide (IMODIUM) 2 MG capsule, Take by mouth as needed for diarrhea or loose stools., Disp: , Rfl:    Magnesium Citrate POWD, Take 435 mg by mouth daily., Disp: , Rfl:    Melatonin 1 MG/ML LIQD, Take by mouth daily. , Disp: , Rfl:    montelukast (SINGULAIR) 5 MG chewable tablet, , Disp: , Rfl:    Na Sulfate-K Sulfate-Mg Sulf 17.5-3.13-1.6 GM/177ML SOLN, Take 1 kit by mouth once for 1 dose., Disp: 354 mL, Rfl: 0    naproxen (NAPROSYN) 250 MG tablet, Take 250 mg by mouth 3 days., Disp: , Rfl:    Nerve Stimulator (CEFALY KIT) DEVI, , Disp: , Rfl:    Non Gelatin Capsules, Empty, (CAPSULE #3 CLEAR/CLEAR VEG) CAPS, Take 1 capsule by mouth daily., Disp: , Rfl:    omega-3 acid ethyl esters (LOVAZA) 1 g capsule, Take 2 capsules (2 g total) by mouth 2 (two) times daily., Disp: 360 capsule, Rfl: 3   polyethylene glycol (MIRALAX / GLYCOLAX) packet, Take 17 g by mouth daily as needed. , Disp: , Rfl:    pseudoephedrine (SUDAFED) 30 MG tablet, Take 30 mg by mouth every 4 (four) hours as needed for congestion. , Disp: , Rfl:    pyridostigmine (MESTINON) 60 MG tablet, Take 15 mg by mouth in the morning and at bedtime., Disp: , Rfl:    Quercetin 500 MG CAPS, Take by mouth daily., Disp: , Rfl:    RESTASIS 0.05 % ophthalmic emulsion, INSTILL 1 DROP INTO BOTH EYES TWICE A DAY, Disp: 180 mL, Rfl: 1   Riboflavin (VITAMIN B-2 PO), Take 100 mg by mouth 3 (three) times daily., Disp: , Rfl:    rifaximin (XIFAXAN) 550 MG TABS tablet, Take 1 tablet (550 mg total) by mouth 3 (three) times daily for 14 days., Disp: 42 tablet, Rfl: 0   Rimegepant Sulfate (NURTEC) 75 MG TBDP, Take 1 tablet by mouth as needed., Disp: , Rfl:    Secukinumab (COSENTYX [place]), Inject into the skin. Every 28days, Disp: , Rfl:    simethicone (MYLICON) 80 MG  chewable tablet, Chew 80 mg by mouth every 6 (six) hours as needed for flatulence., Disp: , Rfl:    Sodium Fluoride 1.1 % PSTE, Place onto teeth., Disp: , Rfl:    TRIAMCINOLONE ACETONIDE,NASAL, NA, Place into the nose as needed., Disp: , Rfl:    UBRELVY 50 MG TABS, TAKE 1 TABLET BY MOUTH ONCE AS NEEDED FOR UP TO 1 DOSE, Disp: , Rfl:    Vitamin D, Cholecalciferol, 10 MCG (400 UNIT) TABS, Take by mouth daily. , Disp: , Rfl:    bupivacaine-EPINEPHrine (MARCAINE W/ EPI) 0.5% -1:200000 SOLN, by Infiltration route as needed., Disp: , Rfl:    Diclofenac Sodium (ASPERCREME ARTHRITIS PAIN EX), Apply topically as needed.,  Disp: , Rfl:    fexofenadine (ALLEGRA) 180 MG tablet, Take 180 mg by mouth daily as needed.  (Patient not taking: Reported on 07/16/2021), Disp: , Rfl:    Family History  Problem Relation Age of Onset   Colon cancer Father 37   Breast cancer Maternal Aunt 58       BRCA neg   Endometrial cancer Maternal Aunt 50   Colon cancer Maternal Grandmother 30   Migraines Maternal Grandmother    Tremor Maternal Grandmother    Leukemia Maternal Grandfather 11   Food Allergy Maternal Grandfather    Migraines Mother    Glaucoma Mother    Cataracts Mother    Tremor Mother    Fibromyalgia Mother    Heart disease Mother    Colon polyps Brother        non-cancerous     Social History   Tobacco Use   Smoking status: Never   Smokeless tobacco: Never  Vaping Use   Vaping Use: Never used  Substance Use Topics   Alcohol use: Never   Drug use: Never    Allergies as of 07/16/2021 - Review Complete 07/16/2021  Allergen Reaction Noted   Iodinated diagnostic agents Hives, Itching, Palpitations, Photosensitivity, and Shortness Of Breath 12/13/2020   Ioversol Anxiety, Hives, Itching, and Palpitations 12/05/2016   Ciprofloxacin Other (See Comments) 01/12/2018   Eggs or egg-derived products Other (See Comments) 12/09/2017   Ginger Other (See Comments) 12/17/2017   Levofloxacin Other (See Comments) 01/12/2018   Other Other (See Comments) 12/17/2017   Soybean-containing drug products Other (See Comments) 12/17/2017   Bee pollen  08/11/2018   Bupropion  08/11/2018   Celebrex [celecoxib]  08/11/2018   Codeine  08/11/2018   Dexilant [dexlansoprazole]  08/11/2018   Dulera [mometasone furo-formoterol fum]  08/11/2018   Fentanyl  08/11/2018   Fluticasone  08/11/2018   Gabapentin  08/11/2018   Gluten meal  08/11/2018   Lidocaine  08/11/2018   Lyrica [pregabalin]  08/11/2018   Mold extract [trichophyton]  08/11/2018   Morphine and related  08/11/2018   Penicillins  08/11/2018   Pollen extract   08/11/2018   Prilosec [omeprazole]  08/11/2018   Rizatriptan Other (See Comments) 04/14/2019   Salmeterol xinafoate  02/27/2012   Silenor [doxepin hcl]  08/11/2018   Stevioside Other (See Comments) 03/15/2020   Tomato  08/11/2018   Xylitol Other (See Comments) 03/15/2020   Brewer's dried yeast [yeast] Other (See Comments) 06/11/2021   Ethanol Dermatitis, Other (See Comments), Itching, and Palpitations 05/31/2019   Hydrocodone Nausea Only 09/03/2018   Ibuprofen Nausea And Vomiting 02/27/2012   Lac bovis Diarrhea 11/13/2014   Lanolin Other (See Comments) 12/09/2017   Milk-related compounds Diarrhea 11/13/2014   Morphine Nausea Only 08/11/2018   Wheat bran Diarrhea 11/13/2014   Yeast-related  products Other (See Comments) 06/11/2021    Review of Systems:    All systems reviewed and negative except where noted in HPI.   Physical Exam:  BP 109/71 (BP Location: Left Arm, Patient Position: Sitting, Cuff Size: Normal)   Pulse 75   Temp 98.6 F (37 C) (Oral)   Ht $R'5\' 2"'NX$  (1.575 m)   Wt 145 lb 6.4 oz (66 kg)   BMI 26.59 kg/m  No LMP recorded. Patient is postmenopausal.  General:   Alert,  Well-developed, well-nourished, pleasant and cooperative in NAD Head:  Normocephalic and atraumatic. Eyes:  Sclera clear, no icterus.   Conjunctiva pink. Ears:  Normal auditory acuity. Nose:  No deformity, discharge, or lesions. Mouth:  No deformity or lesions,oropharynx pink & moist. Neck:  Supple; no masses or thyromegaly. Lungs:  Respirations even and unlabored.  Clear throughout to auscultation.   No wheezes, crackles, or rhonchi. No acute distress. Heart:  Regular rate and rhythm; no murmurs, clicks, rubs, or gallops. Abdomen:  Normal bowel sounds. Soft, non-tender and moderately distended, tympanic to percussion, faint midline vertical scar without masses, hepatosplenomegaly or hernias noted.  No guarding or rebound tenderness.   Rectal: Not performed Msk:  Symmetrical without gross  deformities. Good, equal movement & strength bilaterally. Pulses:  Normal pulses noted. Extremities:  No clubbing or edema.  No cyanosis. Neurologic:  Alert and oriented x3;  grossly normal neurologically. Skin:  Intact without significant lesions or rashes. No jaundice. Psych:  Alert and cooperative. Normal mood and affect.  Imaging Studies: Reviewed  Assessment and Plan:   Kobie Stapleford Belay is a 50 y.o. Caucasian female with history of colon cancer in 2009 status post right hemicolectomy with primary anastomosis, neoadjuvant chemo, in remission, mast cell activation syndrome, mild pelvic floor dysfunction with mild rectocele seen for follow-up of pelvic floor dysfunction, increased bowel frequency, abdominal bloating, chronic left-sided upper abdominal pain   Chronic left-sided upper abdominal pain, mild and associated with abdominal bloating and loose stools ?  Diarrhea predominant IBS She had cross-sectional imaging which was unremarkable, EGD and colonoscopy were unremarkable Tried amitriptyline in the past Check pancreatic fecal elastase levels, continue low-dose Creon  Empiric trial of rifaximin 550 mg 3 times daily Pelvic floor dysfunction resulting in retained stool  History of colon cancer, history of tubular adenomas of colon: Recommend surveillance colonoscopy Also, recommend evaluation of neoterminal ileum with biopsies and random colon biopsies   Follow up based on the above work-up   [name], MD

## 2021-07-17 ENCOUNTER — Ambulatory Visit: Payer: No Typology Code available for payment source

## 2021-07-17 DIAGNOSIS — R519 Headache, unspecified: Secondary | ICD-10-CM | POA: Insufficient documentation

## 2021-07-17 DIAGNOSIS — F411 Generalized anxiety disorder: Secondary | ICD-10-CM | POA: Insufficient documentation

## 2021-07-17 DIAGNOSIS — R479 Unspecified speech disturbances: Secondary | ICD-10-CM | POA: Insufficient documentation

## 2021-07-17 DIAGNOSIS — R262 Difficulty in walking, not elsewhere classified: Secondary | ICD-10-CM | POA: Insufficient documentation

## 2021-07-17 DIAGNOSIS — H53149 Visual discomfort, unspecified: Secondary | ICD-10-CM | POA: Insufficient documentation

## 2021-07-18 ENCOUNTER — Encounter: Payer: Self-pay | Admitting: Gastroenterology

## 2021-07-18 ENCOUNTER — Other Ambulatory Visit: Payer: Self-pay | Admitting: Physician Assistant

## 2021-07-18 DIAGNOSIS — R160 Hepatomegaly, not elsewhere classified: Secondary | ICD-10-CM

## 2021-07-19 ENCOUNTER — Other Ambulatory Visit: Payer: Self-pay

## 2021-07-19 ENCOUNTER — Ambulatory Visit
Admission: RE | Admit: 2021-07-19 | Discharge: 2021-07-19 | Disposition: A | Discharge status: Home / Self Care | Payer: Medicare Other | Source: Ambulatory Visit | Attending: Physician Assistant | Admitting: Physician Assistant

## 2021-07-19 DIAGNOSIS — R160 Hepatomegaly, not elsewhere classified: Secondary | ICD-10-CM | POA: Diagnosis not present

## 2021-07-21 ENCOUNTER — Encounter: Payer: Self-pay | Admitting: Gastroenterology

## 2021-07-23 NOTE — Telephone Encounter (Signed)
Hope, I have sent you an appeal letter for Xifaxan  Thanks RV

## 2021-07-24 ENCOUNTER — Telehealth: Payer: Self-pay

## 2021-07-24 NOTE — Telephone Encounter (Signed)
Faxed letter written by Dr Marius Ditch to [place] about patients medicine

## 2021-07-24 NOTE — Telephone Encounter (Signed)
Insurance sent a appeal to be filled out by patient to so I sent to patient and she is filling it out and gonna send it back so I can fax it to insurance

## 2021-07-25 ENCOUNTER — Telehealth: Payer: Self-pay | Admitting: Gastroenterology

## 2021-07-25 ENCOUNTER — Telehealth: Payer: Self-pay

## 2021-07-25 NOTE — Telephone Encounter (Signed)
Is this the correct order?

## 2021-07-25 NOTE — Addendum Note (Signed)
Addended by: Virginia Crews on: 07/25/2021 10:52 AM   Modules accepted: Orders

## 2021-07-25 NOTE — Progress Notes (Signed)
Ty!

## 2021-07-25 NOTE — Telephone Encounter (Signed)
Called and made sure they have everything they need for patient insurance said it will take 24-72 hours before we know anything about approval

## 2021-07-25 NOTE — Addendum Note (Signed)
Addended by: Doristine Devoid on: 07/25/2021 10:20 AM   Modules accepted: Orders

## 2021-07-26 ENCOUNTER — Other Ambulatory Visit: Payer: Self-pay

## 2021-07-26 ENCOUNTER — Ambulatory Visit (INDEPENDENT_AMBULATORY_CARE_PROVIDER_SITE_OTHER): Payer: Medicare Other | Admitting: Family Medicine

## 2021-07-26 ENCOUNTER — Telehealth: Payer: Self-pay

## 2021-07-26 ENCOUNTER — Encounter: Payer: Self-pay | Admitting: Gastroenterology

## 2021-07-26 ENCOUNTER — Encounter: Payer: Self-pay | Admitting: Family Medicine

## 2021-07-26 VITALS — BP 138/78 | HR 88 | Temp 97.9°F | Resp 20 | Wt 145.0 lb

## 2021-07-26 DIAGNOSIS — Q796 Ehlers-Danlos syndrome, unspecified: Secondary | ICD-10-CM | POA: Diagnosis not present

## 2021-07-26 DIAGNOSIS — S0300XD Dislocation of jaw, unspecified side, subsequent encounter: Secondary | ICD-10-CM | POA: Diagnosis not present

## 2021-07-26 DIAGNOSIS — G90A Postural orthostatic tachycardia syndrome (POTS): Secondary | ICD-10-CM

## 2021-07-26 DIAGNOSIS — R6884 Jaw pain: Secondary | ICD-10-CM

## 2021-07-26 DIAGNOSIS — G500 Trigeminal neuralgia: Secondary | ICD-10-CM

## 2021-07-26 DIAGNOSIS — G894 Chronic pain syndrome: Secondary | ICD-10-CM | POA: Diagnosis not present

## 2021-07-26 LAB — PANCREATIC ELASTASE, FECAL: Pancreatic Elastase, Fecal: 132 ug Elast./g — ABNORMAL LOW (ref >200)

## 2021-07-26 NOTE — Progress Notes (Signed)
Established patient visit   Patient: Autumn Zhang   DOB: 01-Jun-1971   50 y.o. Female  MRN: [id number] Visit Date: 07/26/2021  Today's healthcare provider: [name], MD   Chief Complaint  Patient presents with   Jaw Pain   Speech Problem   Subjective    HPI  Follow up for Jaw pain:  The patient was last seen for this 2 weeks ago. Changes made at last visit include; seen by Sharen Counter. Chronic instability L TMJ d/t EDS, acute flare causing dysphagia. Acutely tender L TMJ unable to evaluate malocclusion d/t severe pain. Rx mandibular imaging with closed and open mouth.  Patient reports pain is worsen.  Additional details noted on this papers that patient brings with her - we discussed and clarified this information:       -----------------------------------------------------------------------------------------     Medications: Outpatient Medications Prior to Visit  Medication Sig   acetaminophen (TYLENOL) 500 MG tablet Take 500 mg by mouth every 6 (six) hours as needed.   albuterol (PROVENTIL HFA;VENTOLIN HFA) 108 (90 Base) MCG/ACT inhaler Inhale 2 puffs into the lungs every 4 (four) hours as needed.    Ascorbic Acid (VITAMIN C) 1000 MG tablet Take 1,000 mg by mouth daily. probably total is 900 mg   aspirin 325 MG tablet Take by mouth.   augmented betamethasone dipropionate (DIPROLENE-AF) 0.05 % cream APPLY TWICE DAILY AS NEEDED TO THE AFFECTED AREAS FOR 5-7 DAYS FOR ECZEMA.   b complex vitamins capsule Take 1 capsule by mouth daily. 1/4 capsule a day   BELSOMRA 5 MG TABS Take 1 tablet by mouth at bedtime as needed.    Blood Pressure Monitoring (BLOOD PRESSURE CUFF) MISC Take blood pressure as needed for symptoms.   Boswellia Serrata (BOSWELLIA PO) Take 150 mg by mouth every morning.    Calcium Citrate 200 MG TABS Take 600 mg by mouth.   cromolyn (GASTROCROM) 100 MG/5ML solution Take by mouth 4 (four) times daily -  before meals and at  bedtime.   Cyanocobalamin (VITAMIN B-12) 2500 MCG SUBL Place under the tongue.   diphenhydrAMINE (BENADRYL) 12.5 MG/5ML liquid Take by mouth 4 (four) times daily as needed.   diphenhydrAMINE-Allantoin 2-0.5 % CREA Apply 1 application topically as needed.    docusate sodium (COLACE) 100 MG capsule Take 100 mg by mouth every other day.    econazole nitrate 1 % cream APPLY TO FEET TWICE A DAY FOR 2 4 WEEKS AS NEEDED FOR FLARE   EPINEPHrine 0.3 mg/0.3 mL IJ SOAJ injection INJECT ONE AUTO-INJECTOR INTRAMUSCULARLY AS NEEDED   estradiol (ESTRACE) 0.1 MG/GM vaginal cream INSERT 1 GRAM VAGINALLY    TWICE WEEKLY   famotidine (PEPCID) 10 MG tablet Take 10 mg by mouth daily.    fexofenadine (ALLEGRA) 180 MG tablet    guaifenesin (HUMIBID E) 400 MG TABS tablet Take 400 mg by mouth daily.    IODINE EX Apply 225 mcg topically. Every 3 days   KETAMINE HCL SL Place into right nostril as needed.   ketotifen (ZADITOR) 0.025 % ophthalmic solution Place 1 drop into both eyes daily as needed.   Ketotifen Fumarate POWD 1 mg by Does not apply route. 5-6 times a day   L-Lysine 500 MG CAPS Take by mouth 2 (two) times daily.   L-Theanine 100 MG CAPS Take 200 mg by mouth daily.    levocetirizine (XYZAL) 2.5 MG/5ML solution    Light Mineral Oil-Mineral Oil 0.5-0.5 % EMUL Apply 1 application to  eye daily.   lipase/protease/amylase (CREON) 12000-38000 units CPEP capsule Take 12,000 Units by mouth as directed.   loperamide (IMODIUM) 2 MG capsule Take by mouth as needed for diarrhea or loose stools.   Magnesium Citrate POWD Take 435 mg by mouth daily.   Melatonin 1 MG/ML LIQD Take by mouth daily.    montelukast (SINGULAIR) 5 MG chewable tablet    naproxen (NAPROSYN) 250 MG tablet Take 250 mg by mouth 3 days.   Nerve Stimulator (CEFALY KIT) DEVI    Non Gelatin Capsules, Empty, (CAPSULE #3 CLEAR/CLEAR VEG) CAPS Take 1 capsule by mouth daily.   NONFORMULARY OR COMPOUNDED ITEM Bupivacaine nasal 0.5% drops w/o epi   omega-3  acid ethyl esters (LOVAZA) 1 g capsule Take 2 capsules (2 g total) by mouth 2 (two) times daily.   polyethylene glycol (MIRALAX / GLYCOLAX) packet Take 17 g by mouth daily as needed.    pseudoephedrine (SUDAFED) 30 MG tablet Take 30 mg by mouth every 4 (four) hours as needed for congestion.    pyridostigmine (MESTINON) 60 MG tablet Take 15 mg by mouth in the morning and at bedtime.   Quercetin 500 MG CAPS Take by mouth daily.   RESTASIS 0.05 % ophthalmic emulsion INSTILL 1 DROP INTO BOTH EYES TWICE A DAY   Riboflavin (VITAMIN B-2 PO) Take 100 mg by mouth 3 (three) times daily.   rifaximin (XIFAXAN) 550 MG TABS tablet Take 1 tablet (550 mg total) by mouth 3 (three) times daily for 14 days.   Rimegepant Sulfate (NURTEC) 75 MG TBDP Take 1 tablet by mouth as needed.   Secukinumab (COSENTYX [place]) Inject into the skin. Every 28days   Selenium 200 MCG CAPS Take 1 capsule by mouth once a week.   simethicone (MYLICON) 80 MG chewable tablet Chew 80 mg by mouth every 6 (six) hours as needed for flatulence.   Sodium Fluoride 1.1 % PSTE Place onto teeth.   TRIAMCINOLONE ACETONIDE,NASAL, NA Place into the nose as needed.   trolamine salicylate (ASPERCREME/ALOE) 10 % cream Apply topically.   UBRELVY 50 MG TABS TAKE 1 TABLET BY MOUTH ONCE AS NEEDED FOR UP TO 1 DOSE   Vitamin D, Cholecalciferol, 10 MCG (400 UNIT) TABS Take by mouth daily.    No facility-administered medications prior to visit.    Review of Systems  Constitutional:  Negative for appetite change, chills, fatigue and fever.  Respiratory:  Negative for chest tightness and shortness of breath.   Cardiovascular:  Negative for chest pain and palpitations.  Gastrointestinal:  Negative for abdominal pain, nausea and vomiting.  Musculoskeletal:  Positive for myalgias.  Neurological:  Negative for dizziness and weakness.       Objective    BP 138/78 (BP Location: Left Arm, Patient Position: Sitting, Cuff Size: Large)   Pulse 88   Temp 97.9 F  (36.6 C) (Temporal)   Resp 20   Wt 145 lb (65.8 kg)   SpO2 97%   BMI 26.52 kg/m  BP Readings from Last 3 Encounters:  07/26/21 138/78  07/16/21 109/71  07/05/21 119/77   Wt Readings from Last 3 Encounters:  07/26/21 145 lb (65.8 kg)  07/16/21 145 lb 6.4 oz (66 kg)  07/05/21 146 lb 9.6 oz (66.5 kg)      Physical Exam Vitals reviewed.  Constitutional:      General: She is not in acute distress.    Appearance: Normal appearance. She is well-developed. She is not diaphoretic.  HENT:     Head:  Comments: Wearing jaw stabilizing strap -when she removes this, her heart rate increases and she is more aphasic Eyes:     General: No scleral icterus.    Conjunctiva/sclera: Conjunctivae normal.  Neck:     Thyroid: No thyromegaly.  Cardiovascular:     Rate and Rhythm: Normal rate and regular rhythm.     Pulses: Normal pulses.     Heart sounds: Normal heart sounds. No murmur heard. Pulmonary:     Effort: Pulmonary effort is normal. No respiratory distress.     Breath sounds: Normal breath sounds. No wheezing, rhonchi or rales.  Musculoskeletal:     Cervical back: Neck supple.     Right lower leg: No edema.     Left lower leg: No edema.  Lymphadenopathy:     Cervical: No cervical adenopathy.  Skin:    General: Skin is warm and dry.     Findings: No rash.  Neurological:     Mental Status: She is alert and oriented to person, place, and time. Mental status is at baseline.  Psychiatric:        Mood and Affect: Mood normal.        Behavior: Behavior normal.      No results found for any visits on 07/26/21.  Assessment & Plan     Problem List Items Addressed This Visit       Cardiovascular and Mediastinum   POTS (postural orthostatic tachycardia syndrome)   Relevant Orders   Ambulatory referral to Oral Maxillofacial Surgery   Ambulatory referral to Pain Clinic     Nervous and Auditory   Trigeminal neuralgia   Relevant Orders   Ambulatory referral to Oral  Maxillofacial Surgery   Ambulatory referral to Pain Clinic     Musculoskeletal and Integument   Ehlers-Danlos syndrome   Relevant Orders   Ambulatory referral to Oral Maxillofacial Surgery   Ambulatory referral to Pain Clinic   TMJ (dislocation of temporomandibular joint)   Relevant Orders   Ambulatory referral to Oral Maxillofacial Surgery   Ambulatory referral to Pain Clinic     Other   Jaw pain - Primary   Relevant Orders   Ambulatory referral to Oral Maxillofacial Surgery   Ambulatory referral to Pain Clinic   Chronic pain syndrome   Relevant Orders   Ambulatory referral to Oral Maxillofacial Surgery   Ambulatory referral to Pain Clinic    -On discussion with the patient and her husband today about her concerns that she brings forward both verbally and written - Her jaw pain and instability seems to be worsening which is leading to difficulty chewing, swallowing, speaking - She is on a mostly liquid diet at this point - She is seeing PT for this and does have upcoming appointment with a jaw specialist-we did look into some other avenues for possible job reconstructive surgery and I have placed an urgent referral to this oral maxillofacial surgeon to see if they may be able to expedite her appointment - She is also looking for some interventional pain management and does have upcoming appointment with Dr. Dossie Arbour for possible SPG or stellate ganglion block that she has had success with in the past for her trigeminal neuralgia and chronic facial pain - I have also pushed through an urgent referral for pain management to see if we can move up this appointment - Agree with patient that x-rays that were ordered by PA last week may not provide any additional information for the surgeon and he is likely to  do his own imaging - She is also seeing Dr. Melrose Nakayama with neurology for her trigeminal neuralgia and has upcoming follow-up with him in 1 month - It has been presented to her about  considering Abilify and nortriptyline for her facial pain and that she could consider FND as her possible diagnosis - I also suggested Cymbalta so the patient as I am a little worried about the anticholinergic effects of nortriptyline in the setting of her MCAS and POTS requiring Mestinon at this point-she will discuss Cymbalta and nortriptyline with her cardiologist    Return for as scheduled.      Total time spent on today's visit was greater than 40 minutes, including both face-to-face time and nonface-to-face time personally spent on review of chart (labs and imaging), discussing labs and goals, discussing further work-up, treatment options, referrals to specialist if needed, reviewing outside records of pertinent, answering patient's questions, and coordinating care.     I, [name], MD, have reviewed all documentation for this visit. The documentation on 07/26/21 for the exam, diagnosis, procedures, and orders are all accurate and complete.   Autumn Zhang, Dionne Bucy, MD, MPH [facility]

## 2021-07-26 NOTE — Patient Instructions (Signed)
[email or web address]

## 2021-07-26 NOTE — Telephone Encounter (Signed)
error 

## 2021-07-27 ENCOUNTER — Encounter: Payer: Self-pay | Admitting: Family Medicine

## 2021-07-29 ENCOUNTER — Other Ambulatory Visit: Payer: Self-pay | Admitting: Family Medicine

## 2021-07-29 DIAGNOSIS — R633 Feeding difficulties, unspecified: Secondary | ICD-10-CM

## 2021-07-29 DIAGNOSIS — R1310 Dysphagia, unspecified: Secondary | ICD-10-CM

## 2021-07-30 ENCOUNTER — Encounter: Payer: Self-pay | Admitting: Family Medicine

## 2021-07-30 MED ORDER — INDOMETHACIN 25 MG PO CAPS: 25.0000 mg | ORAL_CAPSULE | Freq: Three times a day (TID) | ORAL | 1 refills | Status: DC | PRN

## 2021-08-01 ENCOUNTER — Ambulatory Visit: Payer: Medicare Other | Admitting: Family Medicine

## 2021-08-06 ENCOUNTER — Telehealth: Payer: Self-pay | Admitting: Gastroenterology

## 2021-08-06 ENCOUNTER — Telehealth: Payer: Self-pay

## 2021-08-06 NOTE — Telephone Encounter (Signed)
Called patient back she is wanting to think about wether to cancel now because she wanted certain days in December that Dr Autumn Zhang has no schedule she will call me back and let me know whats she wants to do

## 2021-08-06 NOTE — Telephone Encounter (Signed)
Inbound call from pt needing to r/s her procedure.

## 2021-08-07 ENCOUNTER — Ambulatory Visit
Admission: RE | Admit: 2021-08-07 | Discharge: 2021-08-07 | Disposition: A | Discharge status: Home / Self Care | Payer: Medicare Other | Source: Ambulatory Visit | Attending: Family Medicine | Admitting: Family Medicine

## 2021-08-07 DIAGNOSIS — R1310 Dysphagia, unspecified: Secondary | ICD-10-CM | POA: Diagnosis present

## 2021-08-07 DIAGNOSIS — R633 Feeding difficulties, unspecified: Secondary | ICD-10-CM

## 2021-08-07 NOTE — Therapy (Signed)
[place], [place], [address] Phone: [phone]   Fax:     Modified Barium Swallow  Patient Details  Name: Autumn Zhang MRN: [id number] Date of Birth: [birth year] No data recorded  Encounter Date: 08/07/2021   End of Session - 08/07/21 1752     Visit Number 1    Number of Visits 1    Date for SLP Re-Evaluation 08/07/21    SLP Start Time 1225    SLP Stop Time  1300    SLP Time Calculation (min) 35 min    Activity Tolerance Patient tolerated treatment well             Subjective Assessment - 08/07/21 1400     Subjective Pt reports she wears chin strap to prevent dislocation of her jaw.    Currently in Pain? Yes    Pain Score 5     Pain Location Jaw    Aggravating Factors  talking, eating            Objective Swallowing Evaluation: Type of Study: MBS-Modified Barium Swallow Study   Patient Details  Name: Autumn Zhang MRN: [id number] Date of Birth: Sep 13, 1971  Today's Date: 08/07/2021 Time: No data recorded-No data recorded No data recorded  Past Medical History:  Past Medical History:  Diagnosis Date   Allergy    Anemia    Anxiety    Arthritis    Asthma    Attention deficit hyperactivity disorder (ADHD), combined type 10/18/2015   Diagnosed many years ago.  Has been doing well on Ritalin 17m QID to six times a day. Long acting hasn't worked well in the past and she has not wanted to try Adderall. She is taking wellbutrin as well, but she has been weaning medication due to SE migraine.  She is now down to Wellbutrin one quarter tablet daily.  Last Assessment & Plan:  A/P: Stable.  Continue current dosing.  Refilled 3 months   Basal cell carcinoma    BRCA negative 09/2018   [facility]; 1/20 MyRisk neg except RPS20 VUS; IBIS=10.8%/riskscore=12%   Colon cancer ([facility]    Dysautonomia ([place]    Ehlers-Danlos syndrome type III    Family history of colon cancer     GERD (gastroesophageal reflux disease)    Hyperlipidemia    Idiopathic small fiber peripheral neuropathy    Leiomyoma    Mast cell activation (HCC)    Migraines    Neck pain    Occipital neuralgia    Sleep apnea    Past Surgical History:  Past Surgical History:  Procedure Laterality Date   APPENDECTOMY     removed with colectomy   BREAST SURGERY     reduction   COLON SURGERY  2008   for colon cancer, ~1/3 of colon removed, primary reanastimosis   COLONOSCOPY WITH PROPOFOL N/A 10/07/2018   Procedure: COLONOSCOPY WITH PROPOFOL;  Surgeon: VLin Landsman MD;  Location: [facility] ENDOSCOPY;  Service: Gastroenterology;  Laterality: N/A;   ENDOMETRIAL ABLATION     HPI: Autumn Zhang a 50year old female with past medical history including: Ehlers-Danlos Syndrome, TMJ disorderchronic migraines, POTS, basal cell carcinoma, colon cancer, cervical dystonia, trigeminal neuralgia, hiatal hernia, restless leg syndrome, chronic pain syndrome, anxiety, ADHD, depression, fibromyalgia, GERD, IBS, and psoriasis who presents as a referral for outpatient MBS from BVirginia Crews MD. Patient has had 2 MBS previously, in 2010 and 2019, which were  reviewed. Patient describes new symptom which began on 07/02/21 when she "stopped being able to speak" at the same time as she had jaw pain. If she leans over to the right side she is able to speak again. Pt describes this not as a language problem ("I can think of the words.") but as a motor problem: "my mouth can't make the words." Recently seen by Dr. 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   No data recorded  Assessment / Plan / Recommendation  Clinical Impressions 08/07/2021  Clinical Impression Patient presents with grossly functional oropharyngeal swallowing function with no penetration or aspiration observed. Patient was assessed initially while wearing her chin strap, and then subsequently without wearing her chin  strap. Per the radiologist, "There is no visually apparent difference in movement of the mandibular condyles without  or with chin strap." There was also no difference in oropharyngeal function observed with or without chin strap. Oral stage is characterized by adequate bolus containment and timely anterior to posterior transfer. Patient uses a munching pattern for mastication vs rotary chew, and often preps bolus by pressing material to palate with her tongue. This results in intermittent mild lingual residue with solids, which pt senses and clears with second swallow. Pharyngeal phase is characterized by timely swallow initiation at the level of the base of tongue, adequate tongue base retraction, adequate hyolaryngeal excursion and adequate pharyngeal constriction. Normal structural variation of a curved epiglottis, which contributes to a minimal collection of residue after intial swallow, which pt senses and clears automatically with subsequent swallow. There is no penetration or aspiration. Cervical esophageal phase is noted for one instance of backflow which remained below the level of the pharyngoesophageal segement. Patient reported globus sensation at level of thyroid notch with solids which did not correlate with any residual or contrast observed in the pharynx. An esophageal sweep was performed which was unremarkable. Given history of GERD and instance of backflow observed, patient was provided with a handout re: esophageal dysphagia and strategies including upright posture after meals and alternating solids/liquids. Patient is able to safely consume and functionally manipulate all textures administered on this examination, and is encouraged to consume self-selected solids (dysphagia 3/mechanical soft, moistened solids may be more comfortable for patient than regular solids) and thin liquids.  SLP Visit Diagnosis Dysphagia, oropharyngeal phase (R13.12);Dysphagia, pharyngoesophageal phase (R13.14)   Attention and concentration deficit following --  Frontal lobe and executive function deficit following --  Impact on safety and function Mild aspiration risk (hx GERD)      Treatment Recommendations 08/07/2021  Treatment Recommendations Other (Comment)     No flowsheet data found.  Diet Recommendations 08/07/2021  SLP Diet Recommendations Dysphagia 3 (Mech soft) solids  Liquid Administration via Cup  Medication Administration Whole meds with liquid  Compensations Slow rate;Small sips/bites;Follow solids with liquid;Other (Comment)  Postural Changes Remain semi-upright after after feeds/meals (Comment);Seated upright at 90 degrees      Other Recommendations 08/07/2021  Recommended Consults --  Oral Care Recommendations Oral care BID  Other Recommendations --  Follow Up Recommendations Outpatient SLP  Assistance recommended at discharge --  Functional Status Assessment --    No flowsheet data found.    Oral Phase 08/07/2021  Oral Phase WFL  Oral - Nectar Teaspoon WFL  Oral - Nectar Cup WFL  Oral - Nectar Straw --  Oral - Thin Teaspoon WFL  Oral - Thin Cup WFL  Oral - Thin Straw --  Oral - Puree WFL  Oral -  Mech Soft Other (Comment)munching pattern vs rotary chew ;Lingual/palatal residue  Oral - Regular Lingual/palatal residue;Other (Comment) munching pattern vs rotary chew    Pharyngeal Phase 08/07/2021  Pharyngeal Phase WFL  Pharyngeal- Nectar Teaspoon Pharyngeal residue - valleculae  Pharyngeal Material does not enter airway  Pharyngeal- Nectar Cup Pharyngeal residue - valleculae  Pharyngeal Material does not enter airway  Pharyngeal- Thin Teaspoon Pharyngeal residue - valleculae  Pharyngeal Material does not enter airway  Pharyngeal- Thin Cup Pharyngeal residue - valleculae;Pharyngeal residue - pyriform  Pharyngeal Material does not enter airway  Pharyngeal- Puree WFL  Pharyngeal Material does not enter airway  Pharyngeal- Mechanical Soft WFL  Pharyngeal  Material does not enter airway  Pharyngeal- Regular [facility]  Pharyngeal Material does not enter airway     Cervical Esophageal Phase  08/07/2021  Cervical Esophageal Phase WFL  Nectar Teaspoon WFL  Nectar Cup WFL  Nectar Straw --  Thin Teaspoon WFL  Thin Cup WFL  Thin Straw --  Puree Esophageal backflow into cervical esophagus  Mechanical Soft WFL  Regular [facility]    Deneise Lever, MS, CCC-SLP Speech-Language Pathologist  Aliene Altes 08/07/2021, 5:53 PM     There were no vitals filed for this visit.      Dysphagia, unspecified type - Plan: DG SWALLOW FUNC OP MEDICARE SPEECH PATH, DG SWALLOW FUNC OP MEDICARE SPEECH PATH  Feeding difficulties - Plan: DG SWALLOW FUNC OP MEDICARE SPEECH PATH, DG SWALLOW FUNC OP MEDICARE SPEECH PATH        Problem List Patient Active Problem List   Diagnosis Date Noted   Tear film insufficiency 07/15/2021   Left lower quadrant abdominal pain 07/05/2021   Palpitations 06/11/2021   Axillary lymphadenopathy 06/11/2021   Immunosuppressed status ([place]) 04/19/2021   History of seronegative inflammatory arthritis 06/28/2020   Osteopenia of multiple sites 06/14/2020   Psoriatic arthritis ([place]) 06/12/2020   Iodine deficiency 06/12/2020   OSA on CPAP 06/12/2020   Chronic pain syndrome 06/12/2020   Psychophysiological insomnia 11/26/2019   Calcific tendinitis of right shoulder 04/10/2019   History of iron deficiency anemia 11/04/2018   History of basal cell carcinoma 10/07/2018   Vaginal dryness 09/07/2018   Jaw pain 07/15/2018   Chronic migraine with aura 07/15/2018   Irritable bowel syndrome with diarrhea 07/15/2018   Rectocele 05/21/2018   Ehlers-Danlos syndrome 03/15/2018   Patellar instability of left knee 03/15/2018   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[place]) 03/04/2018   Instability of right shoulder joint 01/12/2018   Trigeminal neuralgia 12/25/2017   Instability of left shoulder joint 12/25/2017   Nausea 12/25/2017   Occipital  neuralgia 12/25/2017   Dysautonomia ([place]) 12/25/2017   Hiatal hernia 10/14/2017   TMJ (dislocation of temporomandibular joint) 09/29/2017   Pulsatile tinnitus of left ear 12/12/2016   Restless leg syndrome 08/01/2016   Cervical dystonia 03/21/2016   Cervical spine disease 03/21/2016   Paresthesia 02/06/2016   Hypercholesterolemia 12/24/2015   Iron deficiency anemia 10/18/2015   Vitamin D deficiency disease 10/18/2015   Midline low back pain without sciatica 02/06/2015   Hyperglycemia 10/30/2014   History of colon cancer 09/27/2014   POTS (postural orthostatic tachycardia syndrome) 09/16/2003   Chronic fatigue 09/15/2000   Asthma 09/15/1988   Dyspnea 09/15/1988    Aliene Altes 08/07/2021, 5:52 PM  [facility] [place], [place], [address] Phone: [phone]   Fax:     Name: BRITNI DRISCOLL MRN: [id number] Date of Birth: Dec 18, 1970

## 2021-08-12 ENCOUNTER — Ambulatory Visit: Payer: Medicare Other | Attending: Family Medicine | Admitting: Speech Pathology

## 2021-08-12 ENCOUNTER — Other Ambulatory Visit: Payer: Self-pay

## 2021-08-12 DIAGNOSIS — R471 Dysarthria and anarthria: Secondary | ICD-10-CM | POA: Diagnosis present

## 2021-08-12 DIAGNOSIS — R41841 Cognitive communication deficit: Secondary | ICD-10-CM | POA: Diagnosis not present

## 2021-08-12 NOTE — Therapy (Signed)
[place] MAIN [facility] SERVICES [address] [place], [place], [address] Phone: [phone]   Fax:  [phone]  Speech Language Pathology Evaluation  Patient Details  Name: Autumn Zhang MRN: [id number] Date of Birth: 1971/04/10 Referring Provider (SLP): [name]   Encounter Date: 08/12/2021   End of Session - 08/12/21 1442     Visit Number 1    Number of Visits 1    Date for SLP Re-Evaluation 08/12/21    Authorization Type Medicare    SLP Start Time 0900    SLP Stop Time  1000    SLP Time Calculation (min) 60 min    Activity Tolerance Patient tolerated treatment well             Past Medical History:  Diagnosis Date   Allergy    Anemia    Anxiety    Arthritis    Asthma    Attention deficit hyperactivity disorder (ADHD), combined type 10/18/2015   Diagnosed many years ago.  Has been doing well on Ritalin $RemoveBe'5mg'SKNAOfPfQ$  QID to six times a day. Long acting hasn't worked well in the past and she has not wanted to try Adderall. She is taking wellbutrin as well, but she has been weaning medication due to SE migraine.  She is now down to Wellbutrin one quarter tablet daily.  Last Assessment & Plan:  A/P: Stable.  Continue current dosing.  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   Subjective Assessment - 08/12/21 1436     Subjective pt pleasant, good historian, has both written and verbal questions                SLP Evaluation [facility] - 08/12/21 0001       SLP Visit Information   SLP Received On 08/12/21    Referring Provider (SLP) [name]    Onset Date 07/26/2021    Medical Diagnosis Speech Problem      General Information   HPI Autumn Zhang is a 50 year old female with past medical history including: Ehlers-Danlos Syndrome, TMJ disorderchronic migraines, POTS, basal cell carcinoma, colon cancer, cervical dystonia, trigeminal neuralgia, hiatal hernia, restless leg syndrome, chronic pain syndrome, anxiety, ADHD, depression, fibromyalgia, GERD, IBS, and psoriasis who presents as a referral for outpatient MBS from Virginia Crews, MD. Patient has had 2 MBS previously, in 2010 and 2019, which were  reviewed. Patient describes new symptom which began on 07/02/21 when she "stopped being able to speak" at the same time as she had jaw pain. If she leans over to the right side she is able to speak again. Pt describes this not as a language problem ("I can think of the words.") but as a motor problem: "my mouth can't make the words." Recently seen by Dr. 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    Behavioral/Cognition appropriate    Mobility Status ambulatory      Balance Screen   Has the patient fallen in the past 6 months No    Has the patient had a decrease in activity level because of a fear of falling?  No    Is the patient reluctant to leave their home because of a fear of falling?  No      Prior Functional Status   Cognitive/Linguistic Baseline Within  functional limits      Cognition   Overall Cognitive Status Within Functional Limits for tasks assessed      Auditory Comprehension   Overall Auditory Comprehension Appears within functional limits for tasks assessed      Reading Comprehension   Reading Status Within funtional limits      Expression   Primary Mode of Expression Verbal      Verbal Expression   Overall Verbal Expression Appears within functional limits for tasks assessed      Written Expression   Dominant Hand Right    Written Expression Within Functional Limits      Oral Motor/Sensory Function   Overall Oral Motor/Sensory Function Appears within functional limits for tasks assessed    Mandible --   not tested d/t pain     Motor Speech   Overall Motor Speech Appears within functional limits for tasks assessed                             SLP Education - 08/12/21 1441     Education Details ST scope of care, referral for neuropyschology, easy onset phonation    Person(s) Educated Patient    Methods Explanation;Demonstration;Handout;Verbal cues    Comprehension Verbalized understanding;Returned demonstration                  Plan - 08/12/21 1443     Clinical Impression Statement Pt is a good historian who reports improving speech abilities with one instance over the last two weeks. These instances have variable triggers that range from brushing her teeth, walking, talking for long period of time (can range from just a couple of minutes to several hours during the month of October). Pt presents with chin strap in place and no focal oral motor deficits. During this evaluation, pt's speech was intelligible and free of any misarticulations, word finding deficits or dysfluencies. Easy onset and non-voice exercises were introduced to help pt during potential moments of speech disturbance. Pt took excellent notes on the information presented, handout was provided and pt was able to return  demonstrate concepts introduced. At this time, skilled ST intervention is not indicated. Spoke with pt on seeking a referral to neuropsychology to help with some characteristics that may be suggestive of a functional neurological disorder. Pt very receptive and will pursue.    Consulted and Agree with Plan of Care Patient               Problem List Patient Active Problem List  Diagnosis Date Noted   Tear film insufficiency 07/15/2021   Left lower quadrant abdominal pain 07/05/2021   Palpitations 06/11/2021   Axillary lymphadenopathy 06/11/2021   Immunosuppressed status ([place]) 04/19/2021   History of seronegative inflammatory arthritis 06/28/2020   Osteopenia of multiple sites 06/14/2020   Psoriatic arthritis ([place]) 06/12/2020   Iodine deficiency 06/12/2020   OSA on CPAP 06/12/2020   Chronic pain syndrome 06/12/2020   Psychophysiological insomnia 11/26/2019   Calcific tendinitis of right shoulder 04/10/2019   History of iron deficiency anemia 11/04/2018   History of basal cell carcinoma 10/07/2018   Vaginal dryness 09/07/2018   Jaw pain 07/15/2018   Chronic migraine with aura 07/15/2018   Irritable bowel syndrome with diarrhea 07/15/2018   Rectocele 05/21/2018   Ehlers-Danlos syndrome 03/15/2018   Patellar instability of left knee 03/15/2018   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[place]) 03/04/2018   Instability of right shoulder joint 01/12/2018   Trigeminal neuralgia 12/25/2017   Instability of left shoulder joint 12/25/2017   Nausea 12/25/2017   Occipital neuralgia 12/25/2017   Dysautonomia ([place]) 12/25/2017   Hiatal hernia 10/14/2017   TMJ (dislocation of temporomandibular joint) 09/29/2017   Pulsatile tinnitus of left ear 12/12/2016   Restless leg syndrome 08/01/2016   Cervical dystonia 03/21/2016   Cervical spine disease 03/21/2016   Paresthesia 02/06/2016   Hypercholesterolemia 12/24/2015   Iron deficiency anemia 10/18/2015   Vitamin D deficiency disease 10/18/2015    Midline low back pain without sciatica 02/06/2015   Hyperglycemia 10/30/2014   History of colon cancer 09/27/2014   POTS (postural orthostatic tachycardia syndrome) 09/16/2003   Chronic fatigue 09/15/2000   Asthma 09/15/1988   Dyspnea 09/15/1988   Autumn Zhang B. Rutherford Nail M.S., CCC-SLP, [facility] Speech-Language Pathologist Rehabilitation Services Office [phone]  Stormy Fabian, [place] 08/12/2021, 2:44 PM  [place] MAIN [facility] SERVICES [address] [place], [place], [address] Phone: [phone]   Fax:  [phone]  Name: Autumn Zhang MRN: [id number] Date of Birth: 1971-03-18

## 2021-08-13 ENCOUNTER — Encounter: Payer: Self-pay | Admitting: Gastroenterology

## 2021-08-13 ENCOUNTER — Encounter: Payer: Self-pay | Admitting: Family Medicine

## 2021-08-14 ENCOUNTER — Ambulatory Visit: Payer: Medicare Other | Admitting: Registered Nurse

## 2021-08-14 ENCOUNTER — Encounter: Payer: Self-pay | Admitting: Gastroenterology

## 2021-08-14 ENCOUNTER — Encounter
Admission: RE | Disposition: A | Discharge status: Home / Self Care | Payer: Self-pay | Source: Home / Self Care | Attending: Gastroenterology

## 2021-08-14 ENCOUNTER — Other Ambulatory Visit: Payer: Self-pay

## 2021-08-14 ENCOUNTER — Ambulatory Visit
Admission: RE | Admit: 2021-08-14 | Discharge: 2021-08-14 | Disposition: A | Discharge status: Home / Self Care | Payer: Medicare Other | Attending: Gastroenterology | Admitting: Gastroenterology

## 2021-08-14 DIAGNOSIS — Z1211 Encounter for screening for malignant neoplasm of colon: Secondary | ICD-10-CM | POA: Insufficient documentation

## 2021-08-14 DIAGNOSIS — Z85038 Personal history of other malignant neoplasm of large intestine: Secondary | ICD-10-CM | POA: Diagnosis not present

## 2021-08-14 DIAGNOSIS — F909 Attention-deficit hyperactivity disorder, unspecified type: Secondary | ICD-10-CM | POA: Diagnosis not present

## 2021-08-14 DIAGNOSIS — Z980 Intestinal bypass and anastomosis status: Secondary | ICD-10-CM | POA: Insufficient documentation

## 2021-08-14 DIAGNOSIS — E785 Hyperlipidemia, unspecified: Secondary | ICD-10-CM | POA: Diagnosis not present

## 2021-08-14 DIAGNOSIS — F419 Anxiety disorder, unspecified: Secondary | ICD-10-CM | POA: Insufficient documentation

## 2021-08-14 DIAGNOSIS — D649 Anemia, unspecified: Secondary | ICD-10-CM | POA: Insufficient documentation

## 2021-08-14 DIAGNOSIS — Z87891 Personal history of nicotine dependence: Secondary | ICD-10-CM | POA: Insufficient documentation

## 2021-08-14 DIAGNOSIS — M1990 Unspecified osteoarthritis, unspecified site: Secondary | ICD-10-CM | POA: Insufficient documentation

## 2021-08-14 DIAGNOSIS — Z86010 Personal history of colonic polyps: Secondary | ICD-10-CM | POA: Diagnosis present

## 2021-08-14 DIAGNOSIS — J45909 Unspecified asthma, uncomplicated: Secondary | ICD-10-CM | POA: Diagnosis not present

## 2021-08-14 DIAGNOSIS — K219 Gastro-esophageal reflux disease without esophagitis: Secondary | ICD-10-CM | POA: Insufficient documentation

## 2021-08-14 DIAGNOSIS — G4730 Sleep apnea, unspecified: Secondary | ICD-10-CM | POA: Diagnosis not present

## 2021-08-14 HISTORY — PX: COLONOSCOPY WITH PROPOFOL: SHX5780

## 2021-08-14 LAB — POCT PREGNANCY, URINE: Preg Test, Ur: NEGATIVE

## 2021-08-14 SURGERY — COLONOSCOPY WITH PROPOFOL
Anesthesia: General

## 2021-08-14 MED ORDER — OMEGA-3-ACID ETHYL ESTERS 1 G PO CAPS: 2.0000 g | ORAL_CAPSULE | Freq: Two times a day (BID) | ORAL | 3 refills | Status: DC

## 2021-08-14 MED ORDER — SODIUM CHLORIDE 0.9 % IV SOLN: INTRAVENOUS | Status: DC | PRN

## 2021-08-14 MED ORDER — DIPHENHYDRAMINE HCL 50 MG/ML IJ SOLN
INTRAMUSCULAR | Status: DC | PRN
  Administered 2021-08-14: 25 mg via INTRAVENOUS

## 2021-08-14 MED ORDER — PROPOFOL 10 MG/ML IV BOLUS
INTRAVENOUS | Status: DC | PRN
  Administered 2021-08-14: 60 mg via INTRAVENOUS

## 2021-08-14 MED ORDER — DIPHENHYDRAMINE HCL 50 MG/ML IJ SOLN
INTRAMUSCULAR | Status: AC
  Filled 2021-08-14: qty 1

## 2021-08-14 MED ORDER — SODIUM CHLORIDE 0.9 % IV SOLN: INTRAVENOUS | Status: DC

## 2021-08-14 MED ORDER — PROPOFOL 500 MG/50ML IV EMUL
INTRAVENOUS | Status: DC | PRN
  Administered 2021-08-14: 150 ug/kg/min via INTRAVENOUS

## 2021-08-14 MED ORDER — SUCCINYLCHOLINE CHLORIDE 200 MG/10ML IV SOSY
PREFILLED_SYRINGE | INTRAVENOUS | Status: AC
  Filled 2021-08-14: qty 10

## 2021-08-14 NOTE — Anesthesia Postprocedure Evaluation (Signed)
Anesthesia Post Note  Patient: Autumn Zhang  Procedure(s) Performed: COLONOSCOPY WITH PROPOFOL  Patient location during evaluation: PACU Anesthesia Type: General Level of consciousness: awake and awake and alert Pain management: pain level controlled Vital Signs Assessment: post-procedure vital signs reviewed and stable Respiratory status: spontaneous breathing and nonlabored ventilation Cardiovascular status: blood pressure returned to baseline Anesthetic complications: no   No notable events documented.   Last Vitals:  Vitals:   08/14/21 1042 08/14/21 1052  BP: 108/68 107/73  Pulse: 77 68  Resp: 16 14  Temp: (!) 36.2 C   SpO2: 100% 100%    Last Pain:  Vitals:   08/14/21 1052  TempSrc:   PainSc: 6                  VAN STAVEREN,Jeraldin Fesler

## 2021-08-14 NOTE — Op Note (Signed)
[facility] Gastroenterology Patient Name: Autumn Zhang Procedure Date: 08/14/2021 10:10 AM MRN: [id number] Account #: [id number] Date of Birth: [birth year] Admit Type: Outpatient Age: 50 Room: [facility] ENDO ROOM 4 Gender: Female Note Status: Finalized Instrument Name: Jasper Riling [id number] Procedure:             Colonoscopy Indications:           Surveillance: Personal history of adenomatous polyps                         on last colonoscopy 3 years ago, High risk colon                         cancer surveillance: Personal history of colon cancer,                         Last colonoscopy: January 2020 Providers:             Lin Landsman MD, MD Medicines:             General Anesthesia Complications:         No immediate complications. Estimated blood loss: None. Procedure:             Pre-Anesthesia Assessment:                        - Prior to the procedure, a History and Physical was                         performed, and patient medications and allergies were                         reviewed. The patient is competent. The risks and                         benefits of the procedure and the sedation options and                         risks were discussed with the patient. All questions                         were answered and informed consent was obtained.                         Patient identification and proposed procedure were                         verified by the physician, the nurse, the                         anesthesiologist, the anesthetist and the technician                         in the pre-procedure area in the procedure room in the                         endoscopy suite. Mental Status Examination: alert and  oriented. Airway Examination: normal oropharyngeal                         airway and neck mobility. Respiratory Examination:                         clear to auscultation. CV Examination: normal.                          Prophylactic Antibiotics: The patient does not require                         prophylactic antibiotics. Prior Anticoagulants: The                         patient has taken no previous anticoagulant or                         antiplatelet agents. ASA Grade Assessment: III - A                         patient with severe systemic disease. After reviewing                         the risks and benefits, the patient was deemed in                         satisfactory condition to undergo the procedure. The                         anesthesia plan was to use general anesthesia.                         Immediately prior to administration of medications,                         the patient was re-assessed for adequacy to receive                         sedatives. The heart rate, respiratory rate, oxygen                         saturations, blood pressure, adequacy of pulmonary                         ventilation, and response to care were monitored                         throughout the procedure. The physical status of the                         patient was re-assessed after the procedure.                        After obtaining informed consent, the colonoscope was                         passed under direct vision. Throughout the procedure,  the patient's blood pressure, pulse, and oxygen                         saturations were monitored continuously. The                         Colonoscope was introduced through the anus and                         advanced to the the ileocolonic anastomosis. The                         colonoscopy was performed without difficulty. The                         patient tolerated the procedure well. The quality of                         the bowel preparation was evaluated using the BBPS                         [facility] Bowel Preparation Scale) with scores of: Right                         Colon = 3, Transverse Colon = 3 and Left Colon = 3                          (entire mucosa seen well with no residual staining,                         small fragments of stool or opaque liquid). The total                         BBPS score equals 9. Findings:      The perianal and digital rectal examinations were normal. Pertinent       negatives include normal sphincter tone and no palpable rectal lesions.      There was evidence of a prior end-to-side ileo-colonic anastomosis in       the cecum. This was patent and was characterized by healthy appearing       mucosa. The anastomosis was traversed.      The neo-terminal ileum appeared normal.      The entire examined colon appeared normal.      The retroflexed view of the distal rectum and anal verge was normal and       showed no anal or rectal abnormalities. Impression:            - Patent end-to-side ileo-colonic anastomosis,                         characterized by healthy appearing mucosa.                        - The examined portion of the ileum was normal.                        - The entire examined colon is normal.                        -  The distal rectum and anal verge are normal on                         retroflexion view.                        - No specimens collected. Recommendation:        - Discharge patient to home (with escort).                        - Resume previous diet today.                        - Continue present medications.                        - Repeat colonoscopy in 5 years for surveillance. Procedure Code(s):     --- Professional ---                        R4163, Colorectal cancer screening; colonoscopy on                         individual at high risk Diagnosis Code(s):     --- Professional ---                        Z86.010, Personal history of colonic polyps                        Z85.038, Personal history of other malignant neoplasm                         of large intestine                        Z98.0, Intestinal bypass and anastomosis  status CPT copyright 2019 American Medical Association. All rights reserved. The codes documented in this report are preliminary and upon coder review may  be revised to meet current compliance requirements. Dr. Ulyess Mort Lin Landsman MD, MD 08/14/2021 10:40:47 AM This report has been signed electronically. Number of Addenda: 0 Note Initiated On: 08/14/2021 10:10 AM Scope Withdrawal Time: 0 hours 6 minutes 22 seconds  Total Procedure Duration: 0 hours 10 minutes 5 seconds  Estimated Blood Loss:  Estimated blood loss: none.      [facility]

## 2021-08-14 NOTE — Transfer of Care (Signed)
Immediate Anesthesia Transfer of Care Note  Patient: Autumn Zhang  Procedure(s) Performed: COLONOSCOPY WITH PROPOFOL  Patient Location: Endoscopy Unit  Anesthesia Type:General  Level of Consciousness: drowsy  Airway & Oxygen Therapy: Patient Spontanous Breathing  Post-op Assessment: Report given to RN and Post -op Vital signs reviewed and stable  Post vital signs: Reviewed and stable  Last Vitals:  Vitals Value Taken Time  BP 108/68 08/14/21 1042  Temp    Pulse 75 08/14/21 1042  Resp 14 08/14/21 1042  SpO2 100 % 08/14/21 1042  Vitals shown include unvalidated device data.  Last Pain:  Vitals:   08/14/21 1007  TempSrc: Temporal  PainSc: 6          Complications: No notable events documented.

## 2021-08-14 NOTE — H&P (Signed)
Autumn Darby, MD [address]  [place]  [place], [place] [address]  Main: [phone]  Fax: [phone] Pager: [phone]  Primary Care Physician:  Virginia Crews, MD Primary Gastroenterologist:  Dr. Cephas Zhang  Pre-Procedure History & Physical: HPI:  Autumn Zhang is a 50 y.o. female is here for an colonoscopy.   Past Medical History:  Diagnosis Date   Allergy    Anemia    Anxiety    Arthritis    Asthma    Attention deficit hyperactivity disorder (ADHD), combined type 10/18/2015   Diagnosed many years ago.  Has been doing well on Ritalin 74m QID to six times a day. Long acting hasn't worked well in the past and she has not wanted to try Adderall. She is taking wellbutrin as well, but she has been weaning medication due to SE migraine.  She is now down to Wellbutrin one quarter tablet daily.  Last Assessment & Plan:  A/P: Stable.  Continue current dosing.  Refilled 3 months   Basal cell carcinoma 05/16/2021   Superf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[place]    Migraines    Neck pain    Occipital neuralgia    Sleep apnea     Past Surgical History:  Procedure Laterality Date   APPENDECTOMY     removed with colectomy   BREAST SURGERY     reduction   COLON SURGERY  2008   for colon cancer, ~1/3 of colon removed, primary reanastimosis   COLONOSCOPY WITH PROPOFOL N/A 10/07/2018   Procedure: COLONOSCOPY WITH PROPOFOL;  Surgeon: VLin Landsman MD;  Location: [facility] ENDOSCOPY;  Service: Gastroenterology;  Laterality: N/A;   ENDOMETRIAL ABLATION     trigeminal  stimulator      Prior to Admission medications   Medication Sig Start Date End Date Taking? Authorizing Provider  cromolyn (GASTROCROM) 100 MG/5ML solution Take by mouth 4 (four) times daily -  before meals and at bedtime.   Yes [provider]  famotidine (PEPCID) 10 MG tablet Take 10 mg by mouth daily.    Yes [provider]  ketotifen (ZADITOR) 0.025 % ophthalmic solution Place 1 drop into both eyes daily as needed. 10/29/18  Yes [provider]  levocetirizine (XYZAL) 2.5 MG/5ML solution  04/11/21  Yes [provider]  Magnesium Citrate POWD Take 435 mg by mouth daily. 01/11/19  Yes [provider]  montelukast (SINGULAIR) 5 MG chewable tablet  04/11/21  Yes [provider]  Non Gelatin Capsules, Empty, (CAPSULE #3 CLEAR/CLEAR VEG) CAPS Take 1 capsule by mouth daily. 11/16/19  Yes [provider]  NONFORMULARY OR COMPOUNDED ITEM Bupivacaine nasal 0.5% drops w/o epi   Yes [provider]  omega-3 acid ethyl esters (LOVAZA) 1 g capsule Take 2 capsules (2 g total) by mouth 2 (two) times daily. 08/02/20  Yes Bacigalupo, ADionne Bucy MD  pyridostigmine (MESTINON) 60 MG tablet Take 15 mg by mouth 3 (three) times daily. 06/21/21  Yes [provider]  Quercetin 500 MG CAPS Take by  mouth daily.   Yes [provider]  RESTASIS 0.05 % ophthalmic emulsion INSTILL 1 DROP INTO BOTH EYES TWICE A DAY 03/19/21  Yes Jerrol Banana., MD  Riboflavin (VITAMIN B-2 PO) Take 100 mg by mouth 3 (three) times daily.   Yes [provider]  Secukinumab (COSENTYX [place]) Inject into the skin. Every 28days   Yes [provider]  Selenium 200 MCG CAPS Take 1 capsule by mouth once a week.   Yes [provider]  Vitamin D, Cholecalciferol, 10 MCG (400 UNIT) TABS Take by mouth daily.    Yes [provider]  acetaminophen (TYLENOL) 500 MG tablet Take 500 mg by mouth every 6 (six) hours as needed.    [provider]  albuterol (PROVENTIL HFA;VENTOLIN HFA) 108 (90 Base) MCG/ACT inhaler Inhale 2 puffs into the lungs every 4 (four) hours as needed.  09/19/16   [provider]  Ascorbic Acid (VITAMIN C) 1000 MG tablet Take 1,000 mg by mouth daily. probably total is 900 mg    [provider]  aspirin 325 MG tablet Take by mouth.    [provider]  augmented betamethasone dipropionate (DIPROLENE-AF) 0.05 % cream APPLY TWICE DAILY AS NEEDED TO THE AFFECTED AREAS FOR 5-7 DAYS FOR ECZEMA. 10/22/16   [provider]  b complex vitamins capsule Take 1 capsule by mouth daily. 1/4 capsule a day    [provider]  BELSOMRA 5 MG TABS Take 1 tablet by mouth at bedtime as needed.  01/24/19   [provider]  Blood Pressure Monitoring (BLOOD PRESSURE CUFF) MISC Take blood pressure as needed for symptoms. 06/07/18   [provider]  Boswellia Serrata (BOSWELLIA PO) Take 150 mg by mouth every morning.     [provider]  Calcium Citrate 200 MG TABS Take 600 mg by mouth.    [provider]  Cyanocobalamin (VITAMIN B-12) 2500 MCG SUBL Place under the tongue.    [provider]  diphenhydrAMINE (BENADRYL) 12.5 MG/5ML liquid Take by mouth 4 (four) times daily as needed.    [provider]  diphenhydrAMINE-Allantoin 2-0.5 % CREA Apply 1 application topically as needed.     [provider]  docusate sodium (COLACE) 100 MG capsule Take 100 mg by mouth every other day.     [provider]  econazole nitrate 1 % cream APPLY TO FEET TWICE A DAY FOR 2 4 WEEKS AS NEEDED FOR FLARE 04/06/19   [provider]  EPINEPHrine 0.3 mg/0.3 mL IJ SOAJ injection INJECT ONE AUTO-INJECTOR INTRAMUSCULARLY AS NEEDED 03/09/18   [provider]  estradiol (ESTRACE) 0.1 MG/GM vaginal cream INSERT 1 GRAM VAGINALLY    TWICE WEEKLY 7/86/76   Copland, Alicia B, PA-C  fexofenadine (ALLEGRA) 180 MG tablet     [provider]  guaifenesin (HUMIBID E) 400 MG TABS tablet Take 400 mg by mouth daily.     [provider]  indomethacin (INDOCIN) 25 MG capsule Take 1 capsule (25 mg total) by mouth 3 (three) times daily as needed. 07/30/21   Virginia Crews, MD  IODINE EX Apply 225 mcg topically. Every 3 days    [provider]  KETAMINE HCL SL Place into right nostril as needed.    [provider]  Ketotifen Fumarate POWD 1 mg by Does not apply route. 5-6 times a day    [provider]  L-Lysine 500 MG CAPS Take by mouth 2 (two) times daily.  [provider]  L-Theanine 100 MG CAPS Take 200 mg by mouth daily.     [provider]  Light Mineral Oil-Mineral Oil 0.5-0.5 % EMUL Apply 1 application to eye daily.    [provider]  lipase/protease/amylase (CREON) 12000-38000 units CPEP capsule Take 12,000 Units by mouth as directed.    [provider]  loperamide (IMODIUM) 2 MG capsule Take by mouth as needed for diarrhea or loose stools.    [provider]  Melatonin 1 MG/ML LIQD Take by mouth daily.  10/16/17   [provider]  naproxen (NAPROSYN) 250 MG tablet Take 250 mg by mouth 3 days.    [provider]  Nerve Stimulator (CEFALY KIT) DEVI  09/06/18   [provider]  polyethylene glycol (MIRALAX / GLYCOLAX) packet Take 17 g by mouth daily as needed.     [provider]  pseudoephedrine (SUDAFED) 30 MG tablet Take 30 mg by mouth every 4 (four) hours as needed for congestion.     [provider]  Rimegepant Sulfate (NURTEC) 75 MG TBDP Take 1 tablet by mouth as needed. 10/04/20   [provider]  simethicone (MYLICON) 80 MG chewable tablet Chew 80 mg by mouth every 6 (six) hours as needed for flatulence.    [provider]  Sodium Fluoride 1.1 % PSTE Place onto teeth.    [provider]  TRIAMCINOLONE ACETONIDE,NASAL, NA Place into the nose as needed.     [provider]  trolamine salicylate (ASPERCREME/ALOE) 10 % cream Apply topically.    [provider]  UBRELVY 50 MG TABS TAKE 1 TABLET BY MOUTH ONCE AS NEEDED FOR UP TO 1 DOSE 12/14/18   [provider]    Allergies as of 07/16/2021 - Review Complete 07/16/2021  Allergen Reaction Noted   Iodinated diagnostic agents Hives, Itching, Palpitations, Photosensitivity, and Shortness Of Breath 12/13/2020   Ioversol Anxiety, Hives, Itching, and Palpitations 12/05/2016   Ciprofloxacin Other (See Comments) 01/12/2018   Eggs or egg-derived products Other (See Comments) 12/09/2017   Ginger Other (See Comments) 12/17/2017   Levofloxacin Other (See Comments) 01/12/2018   Other Other (See Comments) 12/17/2017   Soybean-containing drug products Other (See Comments) 12/17/2017   Bee pollen  08/11/2018   Bupropion  08/11/2018   Celebrex [celecoxib]  08/11/2018   Codeine  08/11/2018   Dexilant [dexlansoprazole]  08/11/2018   Dulera [mometasone furo-formoterol fum]  08/11/2018   Fentanyl  08/11/2018   Fluticasone  08/11/2018   Gabapentin  08/11/2018   Gluten meal  08/11/2018   Lidocaine  08/11/2018   Lyrica [pregabalin]  08/11/2018   Mold extract [trichophyton]  08/11/2018   Morphine and related  08/11/2018   Penicillins  08/11/2018   Pollen extract  08/11/2018   Prilosec [omeprazole]  08/11/2018   Rizatriptan Other (See Comments) 04/14/2019   Salmeterol xinafoate  02/27/2012   Silenor [doxepin hcl]  08/11/2018   Stevioside Other (See Comments) 03/15/2020   Tomato  08/11/2018   Xylitol Other (See Comments) 03/15/2020   Brewer's dried yeast [yeast] Other (See Comments) 06/11/2021   Ethanol Dermatitis, Other (See Comments), Itching, and Palpitations 05/31/2019   Hydrocodone Nausea Only 09/03/2018   Ibuprofen Nausea And Vomiting 02/27/2012   Lac bovis Diarrhea 11/13/2014   Lanolin Other (See Comments) 12/09/2017   Milk-related compounds Diarrhea 11/13/2014   Morphine  Nausea Only 08/11/2018   Wheat bran Diarrhea 11/13/2014   Yeast-related products Other (See Comments) 06/11/2021    Family History  Problem  Relation Age of Onset   Colon cancer Father 56   Breast cancer Maternal Aunt 31       BRCA neg   Endometrial cancer Maternal Aunt 50   Colon cancer Maternal Grandmother 55   Migraines Maternal Grandmother    Tremor Maternal Grandmother    Leukemia Maternal Grandfather 43   Food Allergy Maternal Grandfather    Migraines Mother    Glaucoma Mother    Cataracts Mother    Tremor Mother    Fibromyalgia Mother    Heart disease Mother    Colon polyps Brother        non-cancerous    Social History   Socioeconomic History   Marital status: Married    Spouse name: Not on file   Number of children: 0   Years of education: Not on file   Highest education level: Not on file  Occupational History   Occupation: SSI and disability  Tobacco Use   Smoking status: Former    Types: Cigarettes   Smokeless tobacco: Never   Tobacco comments:    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   Vaping Use: Never used  Substance and Sexual Activity   Alcohol use: Never   Drug use: Never   Sexual activity: Not Currently    Birth control/protection: None  Other Topics Concern   Not on file  Social History Narrative   Not on file   Social Determinants of Health   Financial Resource Strain: Not on file  Food Insecurity: Not on file  Transportation Needs: Not on file  Physical Activity: Not on file  Stress: Not on file  Social Connections: Not on file  Intimate Partner Violence: Not on file    Review of Systems: See HPI, otherwise negative ROS  Physical Exam: BP 114/72   Pulse 69   Temp 97.9 F (36.6 C) (Temporal)   Resp 16   Ht 5' 2" (1.575 m)   Wt 64.4 kg   SpO2 100%   BMI 25.97 kg/m  General:   Alert,  pleasant and cooperative in NAD Head:  Normocephalic and atraumatic. Neck:  Supple; no masses or thyromegaly. Lungs:  Clear throughout to  auscultation.    Heart:  Regular rate and rhythm. Abdomen:  Soft, nontender and nondistended. Normal bowel sounds, without guarding, and without rebound.   Neurologic:  Alert and  oriented x4;  grossly normal neurologically.  Impression/Plan: Autumn Zhang is here for an colonoscopy to be performed for h/o colon cancer and adenoma of colon  Risks, benefits, limitations, and alternatives regarding  colonoscopy have been reviewed with the patient.  Questions have been answered.  All parties agreeable.   Sherri Sear, MD  08/14/2021, 10:10 AM

## 2021-08-14 NOTE — Anesthesia Preprocedure Evaluation (Signed)
Anesthesia Evaluation  Patient identified by MRN, date of birth, ID band Patient awake    Reviewed: Allergy & Precautions, NPO status , Patient's Chart, lab work & pertinent test results  Airway Mallampati: II  TM Distance: >3 FB Neck ROM: full    Dental  (+) Teeth Intact   Pulmonary neg pulmonary ROS, asthma , former smoker,    Pulmonary exam normal breath sounds clear to auscultation       Cardiovascular Exercise Tolerance: Good negative cardio ROS Normal cardiovascular exam Rhythm:Regular     Neuro/Psych  Headaches, Anxiety negative neurological ROS  negative psych ROS   GI/Hepatic negative GI ROS, Neg liver ROS, GERD  ,  Endo/Other  negative endocrine ROS  Renal/GU negative Renal ROS  negative genitourinary   Musculoskeletal   Abdominal Normal abdominal exam  (+)   Peds negative pediatric ROS (+)  Hematology negative hematology ROS (+) Blood dyscrasia, anemia ,   Anesthesia Other Findings Past Medical History: No date: Allergy No date: Anemia No date: Anxiety No date: Arthritis No date: Asthma 10/18/2015: Attention deficit hyperactivity disorder (ADHD), combined  type     Comment:  Diagnosed many years ago.  Has been doing well on               Ritalin $Remove'5mg'[id number]$  QID to six times a day. Long acting hasn't               worked well in the past and she has not wanted to try               Adderall. She is taking wellbutrin as well, but she has               been weaning medication due to SE migraine.  She is now               down to Wellbutrin one quarter tablet daily.  Last               Assessment & Plan:  A/P: Stable.  Continue current               dosing.  Refilled 3 months 05/16/2021: Basal cell carcinoma     Comment:  Superficial, R ant lower leg. [place], [place] 09/2018: BRCA negative     Comment:  2015 [facility] at [facility]; has VUS; 1/20 MyRisk neg except RPS20               VUS; IBIS=10.8%/riskscore=12% No date: Colon cancer ([place]) No date: Dysautonomia ([place]) No date: Ehlers-Danlos syndrome type III No date: Family history of colon cancer No date: GERD (gastroesophageal reflux disease) No date: Hyperlipidemia No date: Idiopathic small fiber peripheral neuropathy No date: Leiomyoma No date: Mast cell activation ([facility]) No date: Migraines No date: Neck pain No date: Occipital neuralgia No date: Sleep apnea  Past Surgical History: No date: APPENDECTOMY     Comment:  removed with colectomy No date: BREAST SURGERY     Comment:  reduction 2008: COLON SURGERY     Comment:  for colon cancer, ~1/3 of colon removed, primary               reanastimosis 10/07/2018: COLONOSCOPY WITH PROPOFOL; N/A     Comment:  Procedure: COLONOSCOPY WITH PROPOFOL;  Surgeon: Lin Landsman, MD;  Location:  [facility] ENDOSCOPY;  Service:               Gastroenterology;  Laterality: N/A; No date: ENDOMETRIAL ABLATION No date: trigeminal stimulator     Reproductive/Obstetrics negative OB ROS                             Anesthesia Physical Anesthesia Plan  ASA: 3  Anesthesia Plan: General   Post-op Pain Management:    Induction:   PONV Risk Score and Plan: Propofol infusion and TIVA  Airway Management Planned: Nasal Cannula  Additional Equipment:   Intra-op Plan:   Post-operative Plan:   Informed Consent: I have reviewed the patients History and Physical, chart, labs and discussed the procedure including the risks, benefits and alternatives for the proposed anesthesia with the patient or authorized representative who has indicated his/her understanding and acceptance.     Dental Advisory Given  Plan Discussed with: CRNA and Surgeon  Anesthesia Plan Comments:         Anesthesia Quick Evaluation

## 2021-08-15 ENCOUNTER — Ambulatory Visit (INDEPENDENT_AMBULATORY_CARE_PROVIDER_SITE_OTHER): Payer: Medicare Other | Admitting: Obstetrics and Gynecology

## 2021-08-15 ENCOUNTER — Encounter: Payer: Self-pay | Admitting: Obstetrics and Gynecology

## 2021-08-15 VITALS — BP 104/68 | Ht 62.0 in | Wt 144.0 lb

## 2021-08-15 DIAGNOSIS — Z1231 Encounter for screening mammogram for malignant neoplasm of breast: Secondary | ICD-10-CM | POA: Diagnosis not present

## 2021-08-15 DIAGNOSIS — Z1273 Encounter for screening for malignant neoplasm of ovary: Secondary | ICD-10-CM

## 2021-08-15 DIAGNOSIS — Z85038 Personal history of other malignant neoplasm of large intestine: Secondary | ICD-10-CM

## 2021-08-15 DIAGNOSIS — N898 Other specified noninflammatory disorders of vagina: Secondary | ICD-10-CM

## 2021-08-15 DIAGNOSIS — R1032 Left lower quadrant pain: Secondary | ICD-10-CM

## 2021-08-15 DIAGNOSIS — Z01419 Encounter for gynecological examination (general) (routine) without abnormal findings: Secondary | ICD-10-CM

## 2021-08-15 MED ORDER — ESTRADIOL 0.1 MG/GM VA CREA: TOPICAL_CREAM | VAGINAL | 2 refills | Status: DC

## 2021-08-15 NOTE — Progress Notes (Addendum)
PCP: Erasmo Downer, MD   Chief Complaint  Patient presents with   Gynecologic Exam    Discuss severe left side pelvic pain that lasted about 1 week back in Oct    HPI:      [name] is a 50 y.o. G1P0010 who LMP was No LMP recorded. Patient is postmenopausal., presents today for her MEDICARE annual examination.  Her menses are absent due to endometrial ablation due to menorrhagia/anemia (labs suggestive of menopause 5/21).  She does not have PMB. She does not have vasomotor sx. Hx of leiomyoma in past.  Sex activity: not sexually active due to pain/medical conditions. She does have vaginal dryness and uses estrace crm with some relief. Needs RF sent to [facility].  Last Pap: September 07, 2018  Results were: no abnormalities /neg HPV DNA.  Hx of STDs: none  Last mammogram: 04/08/21 at [facility] Results were: normal--routine follow-up in 12 months  There is a FH of breast cancer in her mat aunt who was BRCA neg. Strong FH cancers on both sides, and pt has personal hx of colon cancer age 52.  Had neg cancer genetic testing at [facility] in ~2015/2016 [place]) with a POLB VUS in research trials. Being followed at [facility]. Pt did [facility] testing 1/20r with RPS20 VUS. IBIS=10.8%/riskscore=12%. The patient does not do self-breast exams.  Dr. Smith Robert wants to follow pt for Lynch cancers even though she had negative genetic testing. Needs yearly GYN u/s. Last one 12/21 at [facility]. Pt has been having LLQ pain since 10/22. Sx very severe at first for a week, had neg barium CT abd/pelvis 10/22 (can't have contrast). Severity has since decreased but still tender to touch at times.   She is current on colonoscopies; had one yesterday. Case being reviewed. Had stage 2 colon cancer with RT hemicolectomy 2016.  Tobacco use: The patient denies current or previous tobacco use. Alcohol use: none No drug use Exercise: very active usually  She does get adequate calcium and Vitamin D in her diet. Hx of  osteopenia and mom has osteoporosis. She exercises regularly, as well as does PT for connective tissue disorder.   Labs with PCP/other MDs. Is complex medical pt.   Patient Active Problem List   Diagnosis Date Noted   Tear film insufficiency 07/15/2021   Left lower quadrant abdominal pain 07/05/2021   Palpitations 06/11/2021   Axillary lymphadenopathy 06/11/2021   Immunosuppressed status (HCC) 04/19/2021   History of seronegative inflammatory arthritis 06/28/2020   Osteopenia of multiple sites 06/14/2020   Psoriatic arthritis (HCC) 06/12/2020   Iodine deficiency 06/12/2020   OSA on CPAP 06/12/2020   Chronic pain syndrome 06/12/2020   Psychophysiological insomnia 11/26/2019   Calcific tendinitis of right shoulder 04/10/2019   History of iron deficiency anemia 11/04/2018   History of basal cell carcinoma 10/07/2018   Vaginal dryness 09/07/2018   Jaw pain 07/15/2018   Chronic migraine with aura 07/15/2018   Irritable bowel syndrome with diarrhea 07/15/2018   Rectocele 05/21/2018   Ehlers-Danlos syndrome 03/15/2018   Patellar instability of left knee 03/15/2018   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   Instability of right shoulder joint 01/12/2018   Trigeminal neuralgia 12/25/2017   Instability of left shoulder joint 12/25/2017   Nausea 12/25/2017   Occipital neuralgia 12/25/2017   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   Hiatal hernia 10/14/2017   TMJ (dislocation of temporomandibular joint) 09/29/2017   Pulsatile tinnitus of left ear 12/12/2016   Restless leg syndrome 08/01/2016  Cervical dystonia 03/21/2016   Cervical spine disease 03/21/2016   Paresthesia 02/06/2016   Hypercholesterolemia 12/24/2015   Iron deficiency anemia 10/18/2015   Vitamin D deficiency disease 10/18/2015   Midline low back pain without sciatica 02/06/2015   Hyperglycemia 10/30/2014   History of colon cancer 09/27/2014   POTS (postural orthostatic tachycardia syndrome) 09/16/2003   Chronic  fatigue 09/15/2000   Asthma 09/15/1988   Dyspnea 09/15/1988    Past Surgical History:  Procedure Laterality Date   APPENDECTOMY     removed with colectomy   BREAST SURGERY     reduction   COLON SURGERY  2008   for colon cancer, ~1/3 of colon removed, primary reanastimosis   COLONOSCOPY WITH PROPOFOL N/A 10/07/2018   Procedure: COLONOSCOPY WITH PROPOFOL;  Surgeon: Lin Landsman, MD;  Location: [place];  Service: Gastroenterology;  Laterality: N/A;   COLONOSCOPY WITH PROPOFOL N/A 08/14/2021   Procedure: COLONOSCOPY WITH PROPOFOL;  Surgeon: Lin Landsman, MD;  Location: [facility] ENDOSCOPY;  Service: Gastroenterology;  Laterality: N/A;   ENDOMETRIAL ABLATION     trigeminal stimulator      Family History  Problem Relation Age of Onset   Colon cancer Father 58   Breast cancer Maternal Aunt 9       BRCA neg   Endometrial cancer Maternal Aunt 50   Colon cancer Maternal Grandmother 48   Migraines Maternal Grandmother    Tremor Maternal Grandmother    Leukemia Maternal Grandfather 57   Food Allergy Maternal Grandfather    Migraines Mother    Glaucoma Mother    Cataracts Mother    Tremor Mother    Fibromyalgia Mother    Heart disease Mother    Colon polyps Brother        non-cancerous    Social History   Socioeconomic History   Marital status: Married    Spouse name: Not on file   Number of children: 0   Years of education: Not on file   Highest education level: Not on file  Occupational History   Occupation: SSI and disability  Tobacco Use   Smoking status: Former    Types: Cigarettes   Smokeless tobacco: Never   Tobacco comments:    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   Vaping Use: Never used  Substance and Sexual Activity   Alcohol use: Never   Drug use: Never   Sexual activity: Not Currently    Birth control/protection: None  Other Topics Concern   Not on file  Social History Narrative   Not on file   Social Determinants of Health   Financial  Resource Strain: Not on file  Food Insecurity: Not on file  Transportation Needs: Not on file  Physical Activity: Not on file  Stress: Not on file  Social Connections: Not on file  Intimate Partner Violence: Not on file     Current Outpatient Medications:    acetaminophen (TYLENOL) 500 MG tablet, Take 500 mg by mouth every 6 (six) hours as needed., Disp: , Rfl:    albuterol (PROVENTIL HFA;VENTOLIN HFA) 108 (90 Base) MCG/ACT inhaler, Inhale 2 puffs into the lungs every 4 (four) hours as needed. , Disp: , Rfl:    Ascorbic Acid (VITAMIN C) 1000 MG tablet, Take 1,000 mg by mouth daily. probably total is 900 mg, Disp: , Rfl:    aspirin 325 MG tablet, Take by mouth., Disp: , Rfl:    augmented betamethasone dipropionate (DIPROLENE-AF) 0.05 % cream, APPLY TWICE DAILY AS NEEDED TO  THE AFFECTED AREAS FOR 5-7 DAYS FOR ECZEMA., Disp: , Rfl:    b complex vitamins capsule, Take 1 capsule by mouth daily. 1/4 capsule a day, Disp: , Rfl:    BELSOMRA 5 MG TABS, Take 1 tablet by mouth at bedtime as needed. , Disp: , Rfl:    Blood Pressure Monitoring (BLOOD PRESSURE CUFF) MISC, Take blood pressure as needed for symptoms., Disp: , Rfl:    Boswellia Serrata (BOSWELLIA PO), Take 150 mg by mouth every morning. , Disp: , Rfl:    Calcium Citrate 200 MG TABS, Take 600 mg by mouth., Disp: , Rfl:    cromolyn (GASTROCROM) 100 MG/5ML solution, Take by mouth 4 (four) times daily -  before meals and at bedtime., Disp: , Rfl:    Cyanocobalamin (VITAMIN B-12) 2500 MCG SUBL, Place under the tongue., Disp: , Rfl:    diphenhydrAMINE (BENADRYL) 12.5 MG/5ML liquid, Take by mouth 4 (four) times daily as needed., Disp: , Rfl:    diphenhydrAMINE-Allantoin 2-0.5 % CREA, Apply 1 application topically as needed. , Disp: , Rfl:    docusate sodium (COLACE) 100 MG capsule, Take 100 mg by mouth every other day. , Disp: , Rfl:    econazole nitrate 1 % cream, APPLY TO FEET TWICE A DAY FOR 2 4 WEEKS AS NEEDED FOR FLARE, Disp: , Rfl:     EPINEPHrine 0.3 mg/0.3 mL IJ SOAJ injection, INJECT ONE AUTO-INJECTOR INTRAMUSCULARLY AS NEEDED, Disp: , Rfl:    famotidine (PEPCID) 10 MG tablet, Take 10 mg by mouth daily. , Disp: , Rfl:    fexofenadine (ALLEGRA) 180 MG tablet, , Disp: , Rfl:    guaifenesin (HUMIBID E) 400 MG TABS tablet, Take 400 mg by mouth daily. , Disp: , Rfl:    indomethacin (INDOCIN) 25 MG capsule, Take 1 capsule (25 mg total) by mouth 3 (three) times daily as needed., Disp: 30 capsule, Rfl: 1   IODINE EX, Apply 225 mcg topically. Every 3 days, Disp: , Rfl:    KETAMINE HCL SL, Place into right nostril as needed., Disp: , Rfl:    ketotifen (ZADITOR) 0.025 % ophthalmic solution, Place 1 drop into both eyes daily as needed., Disp: , Rfl:    Ketotifen Fumarate POWD, 1 mg by Does not apply route. 5-6 times a day, Disp: , Rfl:    L-Lysine 500 MG CAPS, Take by mouth 2 (two) times daily., Disp: , Rfl:    L-Theanine 100 MG CAPS, Take 200 mg by mouth daily. , Disp: , Rfl:    levocetirizine (XYZAL) 2.5 MG/5ML solution, , Disp: , Rfl:    Light Mineral Oil-Mineral Oil 0.5-0.5 % EMUL, Apply 1 application to eye daily., Disp: , Rfl:    lipase/protease/amylase (CREON) 12000-38000 units CPEP capsule, Take 12,000 Units by mouth as directed., Disp: , Rfl:    loperamide (IMODIUM) 2 MG capsule, Take by mouth as needed for diarrhea or loose stools., Disp: , Rfl:    Magnesium Citrate POWD, Take 435 mg by mouth daily., Disp: , Rfl:    Melatonin 1 MG/ML LIQD, Take by mouth daily. , Disp: , Rfl:    montelukast (SINGULAIR) 5 MG chewable tablet, , Disp: , Rfl:    naproxen (NAPROSYN) 250 MG tablet, Take 250 mg by mouth 3 days., Disp: , Rfl:    Nerve Stimulator (CEFALY KIT) DEVI, , Disp: , Rfl:    Non Gelatin Capsules, Empty, (CAPSULE #3 CLEAR/CLEAR VEG) CAPS, Take 1 capsule by mouth daily., Disp: , Rfl:    NONFORMULARY OR  COMPOUNDED ITEM, Bupivacaine nasal 0.5% drops w/o epi, Disp: , Rfl:    omega-3 acid ethyl esters (LOVAZA) 1 g capsule, Take 2  capsules (2 g total) by mouth 2 (two) times daily., Disp: 360 capsule, Rfl: 3   polyethylene glycol (MIRALAX / GLYCOLAX) packet, Take 17 g by mouth daily as needed. , Disp: , Rfl:    pseudoephedrine (SUDAFED) 30 MG tablet, Take 30 mg by mouth every 4 (four) hours as needed for congestion. , Disp: , Rfl:    pyridostigmine (MESTINON) 60 MG tablet, Take 15 mg by mouth 3 (three) times daily., Disp: , Rfl:    Quercetin 500 MG CAPS, Take by mouth daily., Disp: , Rfl:    RESTASIS 0.05 % ophthalmic emulsion, INSTILL 1 DROP INTO BOTH EYES TWICE A DAY, Disp: 180 mL, Rfl: 1   Riboflavin (VITAMIN B-2 PO), Take 100 mg by mouth 3 (three) times daily., Disp: , Rfl:    Rimegepant Sulfate (NURTEC) 75 MG TBDP, Take 1 tablet by mouth as needed., Disp: , Rfl:    Secukinumab (COSENTYX Candor), Inject into the skin. Every 28days, Disp: , Rfl:    Selenium 200 MCG CAPS, Take 1 capsule by mouth once a week., Disp: , Rfl:    simethicone (MYLICON) 80 MG chewable tablet, Chew 80 mg by mouth every 6 (six) hours as needed for flatulence., Disp: , Rfl:    Sodium Fluoride 1.1 % PSTE, Place onto teeth., Disp: , Rfl:    TRIAMCINOLONE ACETONIDE,NASAL, NA, Place into the nose as needed., Disp: , Rfl:    trolamine salicylate (ASPERCREME/ALOE) 10 % cream, Apply topically., Disp: , Rfl:    UBRELVY 50 MG TABS, TAKE 1 TABLET BY MOUTH ONCE AS NEEDED FOR UP TO 1 DOSE, Disp: , Rfl:    Vitamin D, Cholecalciferol, 10 MCG (400 UNIT) TABS, Take by mouth daily. , Disp: , Rfl:    estradiol (ESTRACE) 0.1 MG/GM vaginal cream, INSERT 1 GRAM VAGINALLY TWICE WEEKLY, Disp: 42.5 g, Rfl: 2     ROS:  Review of Systems  Constitutional:  Positive for fatigue. Negative for fever and unexpected weight change.  Respiratory:  Positive for shortness of breath. Negative for cough and wheezing.   Cardiovascular:  Positive for chest pain and palpitations. Negative for leg swelling.  Gastrointestinal:  Positive for constipation, diarrhea and nausea. Negative for  blood in stool and vomiting.  Endocrine: Positive for cold intolerance, heat intolerance and polyuria.  Genitourinary:  Positive for dyspareunia, frequency, pelvic pain and urgency. Negative for dysuria, flank pain, genital sores, hematuria, menstrual problem, vaginal bleeding, vaginal discharge and vaginal pain.  Musculoskeletal:  Positive for arthralgias, back pain, joint swelling and myalgias.  Skin:  Negative for rash.  Neurological:  Positive for dizziness, light-headedness, numbness and headaches. Negative for syncope.  Hematological:  Negative for adenopathy.  Psychiatric/Behavioral:  Negative for agitation, confusion, sleep disturbance and suicidal ideas. The patient is not nervous/anxious.  BREAST: No symptoms    Objective: BP 104/68   Ht $R'5\' 2"'Mp$  (1.575 m)   Wt 144 lb (65.3 kg)   BMI 26.34 kg/m    Physical Exam Constitutional:      Appearance: She is well-developed.  Genitourinary:     Vulva normal.     Right Labia: No rash, tenderness or lesions.    Left Labia: No tenderness, lesions or rash.    No vaginal discharge, erythema or tenderness.      Right Adnexa: not tender and no mass present.    Left Adnexa: tender.  Left Adnexa: no mass present.    No cervical friability or polyp.     Uterus is not enlarged or tender.  Breasts:    Right: No mass, nipple discharge, skin change or tenderness.     Left: No mass, nipple discharge, skin change or tenderness.  Neck:     Thyroid: No thyromegaly.  Cardiovascular:     Rate and Rhythm: Normal rate and regular rhythm.     Heart sounds: Normal heart sounds. No murmur heard. Pulmonary:     Effort: Pulmonary effort is normal.     Breath sounds: Normal breath sounds.  Abdominal:     Palpations: Abdomen is soft.     Tenderness: There is abdominal tenderness in the left lower quadrant. There is no guarding or rebound.    Musculoskeletal:        General: Normal range of motion.     Cervical back: Normal range of motion.   Lymphadenopathy:     Cervical: No cervical adenopathy.  Neurological:     General: No focal deficit present.     Mental Status: She is alert and oriented to person, place, and time.     Cranial Nerves: No cranial nerve deficit.  Skin:    General: Skin is warm and dry.  Psychiatric:        Mood and Affect: Mood normal.        Behavior: Behavior normal.        Thought Content: Thought content normal.        Judgment: Judgment normal.  Vitals reviewed.    Assessment/Plan:  Encounter for annual routine gynecological examination  Encounter for screening mammogram for malignant neoplasm of breast; pt current on mammo with PCP  History of colon cancer--followed by GI; will check with [facility] to see if pt qualifies for update testing  Screening for ovarian cancer - Plan: US PELVIS TRANSVAGINAL NON-OB (TV ONLY)  LLQ pain - Plan: US PELVIS TRANSVAGINAL NON-OB (TV ONLY); check GYN u/s for LLQ pain. If neg, not GYN and question MSK given location and neg imaging. Will f/u with results.   Vaginal dryness - Rx RF estrace crm to [facility].  - Plan: estradiol (ESTRACE) 0.1 MG/GM vaginal cream,    Meds ordered this encounter  Medications   DISCONTD: estradiol (ESTRACE) 0.1 MG/GM vaginal cream    Sig: INSERT 1 GRAM VAGINALLY TWICE WEEKLY    Dispense:  42.5 g    Refill:  2    Order Specific Question:   Supervising Provider    Answer:   Gae Dry [[id number]]   estradiol (ESTRACE) 0.1 MG/GM vaginal cream    Sig: INSERT 1 GRAM VAGINALLY TWICE WEEKLY    Dispense:  42.5 g    Refill:  2    Order Specific Question:   Supervising Provider    Answer:   Gae Dry [[id number]]            GYN counsel breast self exam, mammography screening, menopause, adequate intake of calcium and vitamin D, diet and exercise    F/U  Return in about 1 day (around 08/16/2021) for GYN u/s for LLQ pain--[facility] to call pt.  Yassine Brunsman B. Mosiah Bastin, PA-C 08/19/2021 2:35 PM

## 2021-08-16 ENCOUNTER — Encounter: Payer: Self-pay | Admitting: Obstetrics and Gynecology

## 2021-08-16 ENCOUNTER — Ambulatory Visit: Payer: Medicare Other

## 2021-08-16 DIAGNOSIS — R1032 Left lower quadrant pain: Secondary | ICD-10-CM

## 2021-08-16 DIAGNOSIS — Z1273 Encounter for screening for malignant neoplasm of ovary: Secondary | ICD-10-CM

## 2021-08-19 ENCOUNTER — Other Ambulatory Visit: Payer: Self-pay

## 2021-08-19 ENCOUNTER — Encounter: Payer: Medicare Other | Admitting: Speech Pathology

## 2021-08-19 ENCOUNTER — Other Ambulatory Visit: Payer: Self-pay | Admitting: Gastroenterology

## 2021-08-19 DIAGNOSIS — K8681 Exocrine pancreatic insufficiency: Secondary | ICD-10-CM

## 2021-08-21 ENCOUNTER — Encounter: Payer: Medicare Other | Admitting: Speech Pathology

## 2021-08-22 ENCOUNTER — Ambulatory Visit (INDEPENDENT_AMBULATORY_CARE_PROVIDER_SITE_OTHER): Payer: Medicare Other

## 2021-08-22 ENCOUNTER — Other Ambulatory Visit: Payer: Self-pay

## 2021-08-22 ENCOUNTER — Other Ambulatory Visit: Payer: Self-pay | Admitting: Obstetrics and Gynecology

## 2021-08-22 ENCOUNTER — Other Ambulatory Visit: Payer: Medicare Other

## 2021-08-22 DIAGNOSIS — Z1273 Encounter for screening for malignant neoplasm of ovary: Secondary | ICD-10-CM

## 2021-08-22 DIAGNOSIS — R1032 Left lower quadrant pain: Secondary | ICD-10-CM | POA: Diagnosis not present

## 2021-08-22 DIAGNOSIS — N839 Noninflammatory disorder of ovary, fallopian tube and broad ligament, unspecified: Secondary | ICD-10-CM

## 2021-08-23 ENCOUNTER — Encounter: Payer: Medicare Other | Admitting: Speech Pathology

## 2021-08-25 ENCOUNTER — Other Ambulatory Visit: Payer: Self-pay | Admitting: Obstetrics & Gynecology

## 2021-08-26 ENCOUNTER — Ambulatory Visit: Payer: Medicare Other | Attending: Sports Medicine

## 2021-08-26 ENCOUNTER — Encounter: Payer: Medicare Other | Admitting: Speech Pathology

## 2021-08-26 ENCOUNTER — Other Ambulatory Visit: Payer: Self-pay | Admitting: Obstetrics and Gynecology

## 2021-08-26 DIAGNOSIS — R1032 Left lower quadrant pain: Secondary | ICD-10-CM

## 2021-08-26 DIAGNOSIS — M26609 Unspecified temporomandibular joint disorder, unspecified side: Secondary | ICD-10-CM | POA: Insufficient documentation

## 2021-08-26 DIAGNOSIS — Z1273 Encounter for screening for malignant neoplasm of ovary: Secondary | ICD-10-CM

## 2021-08-26 DIAGNOSIS — M6281 Muscle weakness (generalized): Secondary | ICD-10-CM | POA: Diagnosis present

## 2021-08-26 DIAGNOSIS — N839 Noninflammatory disorder of ovary, fallopian tube and broad ligament, unspecified: Secondary | ICD-10-CM

## 2021-08-26 DIAGNOSIS — G8929 Other chronic pain: Secondary | ICD-10-CM | POA: Insufficient documentation

## 2021-08-26 DIAGNOSIS — R6884 Jaw pain: Secondary | ICD-10-CM | POA: Diagnosis present

## 2021-08-26 NOTE — Therapy (Signed)
[facility] [address] [place], [address] Phone: [phone]   Fax:  [phone]  Physical Therapy Evaluation  Patient Details  Name: Autumn Zhang MRN: [id number] Date of Birth: August 24, 1971 Referring Provider (PT): Rosalia Hammers, DO  Encounter Date: 08/26/2021   PT End of Session - 08/26/21 0848     Visit Number 1    Number of Visits 9    Date for PT Re-Evaluation 10/24/21    PT Start Time 0848    PT Stop Time 0936    PT Time Calculation (min) 48 min             Past Medical History:  Diagnosis Date   Allergy    Anemia    Anxiety    Arthritis    Asthma    Attention deficit hyperactivity disorder (ADHD), combined type 10/18/2015   Diagnosed many years ago.  Has been doing well on Ritalin $RemoveBe'5mg'DpmICUzEf$  QID to six times a day. Long acting hasn't worked well in the past and she has not wanted to try Adderall. She is taking wellbutrin as well, but she has been weaning medication due to SE migraine.  She is now down to Wellbutrin one quarter tablet daily.  Last Assessment & Plan:  A/P: Stable.  Continue current dosing.  Refilled 3 months   Basal cell carcinoma 05/16/2021   Superficial, R ant lower leg. 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[place])    Migraines    Neck pain    Occipital neuralgia    Sleep apnea     Past Surgical History:  Procedure Laterality Date   APPENDECTOMY     removed with colectomy   BREAST SURGERY     reduction   COLON SURGERY  2008   for colon cancer, ~1/3 of colon removed, primary reanastimosis   COLONOSCOPY  WITH PROPOFOL N/A 10/07/2018   Procedure: COLONOSCOPY WITH PROPOFOL;  Surgeon: Lin Landsman, MD;  Location: [facility] ENDOSCOPY;  Service: Gastroenterology;  Laterality: N/A;   COLONOSCOPY WITH PROPOFOL N/A 08/14/2021   Procedure: COLONOSCOPY WITH PROPOFOL;  Surgeon: Lin Landsman, MD;  Location: [facility] ENDOSCOPY;  Service: Gastroenterology;  Laterality: N/A;   ENDOMETRIAL ABLATION     trigeminal stimulator      There were no vitals filed for this visit.    Subjective Assessment - 08/26/21 0855     Subjective L jaw: 4/10 currently, 9/10 at worst in the past 3 months. R jaw 2/10 currently 5/10 at worst for the past 3 months.    Pertinent History TMJ. Had PT at [facility] which went well. No masks however, and preferes masks due to immune compromise. Tried learning to engage the pterygoid muscle. Has to learn how to use the right muscles due to EDH. 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 L jaw bothers her a lot. R jaw dislocates but can pop it back in. L jaw pops out but the soft tissue is very loose that even at rest, it is subluxed.    Currently  in Pain? Yes    Pain Score 4     Pain Type Chronic pain    Aggravating Factors  Walking (shakes her jaw) is the worst. Talking, eating, opening her mouth (better now), lying flat on her back.    Pain Relieving Factors Jaw strap, resting position, ice                [facility] PT Assessment - 08/26/21 0922       Assessment   Medical Diagnosis TMJ    Referring Provider (PT) Rosalia Hammers, DO    Onset Date/Surgical Date 08/13/21    Hand Dominance Right    Prior Therapy Yes with positive results      Precautions   Precaution Comments Ehlers-Danlos, POTS      Observation/Other Assessments   Focus on Therapeutic Outcomes (FOTO)  Orofascial FOTO 40      Posture/Postural Control   Posture Comments slight protracted neck, R shoulder slightly higher, L shoulder slightly lower, R lumbar convexity.                         Objective measurements completed on examination: See above findings.    Per pt. Cardiologist and PCP did not giver her any BP or HR range for exercises. Just to go to ER of she has signs and symptoms of a heart attack  BP L arm sitting, 128/65, HR 68, mechanicall taken, normal cuff Normal range per pt: 100-110/65-70  PT exercise from previous location: PT does isometric L jaw deviation in netural to work Pterygoid ( R lateral ptergoid muscle.) Also opening and closing jaw (about a little less than 1 cm with manual assist to prevent lateral deviation and promote training for neutral opening and closing jaw.  Has been wearing her jaw strap since 2019. Can eat with jaw strap very soft solids. Has neck pain, suboccipital muscle pain. (CCI craniocervical instability).   Also does jaw protraction and return to neutral in about less than 1 cm opening.   Does neck exercises but had to stop due to a flair up mid October which made it difficult to activate the proper muscles.   Does isometric neck exercises: reclined position  R and L eye movement  Looking down with eyes and looking up with eyes with entural head.   Cervical isometrics in neutral (flexion, side bend, chin tucks)  Neurosurgeon told her to limit neck movement.   Wears a cervical collar in the car per surgoen's recommendation.     Going to see a jaw surgeon 08/29/2021.   Email:[email or web address]  Medbridge Access Code [email or web address]  (R jaw deviation movement per pt)   compression pressure to B hips decreases generalized pain.   Gait belt around  B hips slightly decresaed symptoms.   Standing glute max squeeze comfortably to decrease symptoms.   Standing transversyus abdominsi contraciotn comfortably   Response to treatment Pt tolerated session well without aggravation of symptoms. Manual compression pressure to B hips decreases generalized pain.     Clinical impression Pt is a 50 year old  female who came to physical therapy secondary to B jaw pain L > R. Majority of session spent on subjective interview secondary to complicated medical history as well as attaining background information on previous TMJ treatment which consisted of isometric jaw exercises to promote more neutral jaw movement. She also presents with altered posture, and based on her subjective interview: R jaw deviation with dislocations, as well as generalized weakness,  especially of her core/trunk stabilizers. Decreased feeling of generalized pain and improved comfort level with manual compression B hips/pelvis. Improving core strength may help provide relief based on today's evaluation. Pt will benefit from skilled physical therapy services to continue to improve jaw strength and neutral jaw movements, as well as cervical and scapular strengthening, and core strength to help decrease jaw instability and dislocations                  PT Education - 08/26/21 1306     Education Details ther-ex, HEP    Person(s) Educated Patient    Methods Explanation;Demonstration;Tactile cues;Verbal cues;Handout    Comprehension Returned demonstration;Verbalized understanding              PT Short Term Goals - 08/26/21 1325       PT SHORT TERM GOAL #1   Title Patient will be independent with her initial HEP to decrease pain, improve strength, and ability to open and close her mouth more comfortably.    Baseline Pt has started her HEP (08/26/2021)    Time 3    Period Weeks    Status New    Target Date 09/19/21               PT Long Term Goals - 08/26/21 1327       PT LONG TERM GOAL #1   Title Pt will have a decrease in L jaw pain to 3/10 or less at worst to promote ability to open and close her mouth, eat, as well as ambulate more comfortably.    Baseline 9/10 L jaw pain at worst for the past 3 months    Time 8    Period Weeks    Status New    Target Date 10/24/21      PT LONG TERM GOAL #2   Title  Pt will have a decrease in R jaw pain to 2/10 or less at worst to promote ability to open and close her mouth, eat, as well as ambulate more comfortably.    Baseline 5/10 R jaw pain at worst for the past 3 months (08/26/2021)    Time 8    Period Weeks    Status New    Target Date 10/24/21      PT LONG TERM GOAL #3   Title Pt will improve her FOTO score by at least 10 points as a demonstration of improved function.    Baseline Orofascial FOTO 40 (pt might have answered questions based on wearing her jaw strap which helps decrease pain) 08/26/2021.    Time 8    Period Weeks    Status New    Target Date 10/24/21      PT LONG TERM GOAL #4   Title Pt will report decrease in L jaw pain when ambulating to promote ability to walk longer distances.    Baseline Increased L jaw pain with gait, limiting the distance she ambulates (08/26/2021)    Time 8    Period Weeks    Status New    Target Date 10/24/21                    Plan - 08/26/21 1302     Clinical Impression Statement Pt is a 50 year old female who came to physical therapy secondary to B jaw pain L > R. Majority of session spent on subjective interview secondary to complicated medical history as well as attaining background information on previous TMJ treatment  which consisted of isometric jaw exercises to promote more neutral jaw movement. She also presents with altered posture, and based on her subjective interview: R jaw deviation with dislocations, as well as generalized weakness, especially of her core/trunk stabilizers. Decreased feeling of generalized pain and improved comfort level with manual compression B hips/pelvis. Improving core strength may help provide relief based on today's evaluation. Pt will benefit from skilled physical therapy services to continue to improve jaw strength and neutral jaw movements, as well as cervical and scapular strengthening, and core strength to help decrease jaw instability and  dislocations    Personal Factors and Comorbidities Comorbidity 3+;Time since onset of injury/illness/exacerbation;Fitness;Past/Current Experience    Comorbidities Ehlers-Danlos syndrome type III, dysautonomia, occipital neuralgia, basal cell carcinoma, colon CA, arthritis    Examination-Activity Limitations Self Feeding;Locomotion Level    Stability/Clinical Decision Making Stable/Uncomplicated    Clinical Decision Making Low    Rehab Potential Fair    PT Frequency 1x / week    PT Duration 8 weeks    PT Treatment/Interventions Therapeutic activities;Therapeutic exercise;Neuromuscular re-education;Patient/family education;Manual techniques;Dry needling;Biofeedback;Electrical Stimulation;Iontophoresis 4mg /ml Dexamethasone    PT Next Visit Plan posture, core, scapular, cervical, isometric jaw strengthening, manual techniques, modalities PRN    PT Home Exercise Plan Medbridge Access Code [email or web address]    Consulted and Agree with Plan of Care Patient             Patient will benefit from skilled therapeutic intervention in order to improve the following deficits and impairments:  Pain, Improper body mechanics, Postural dysfunction, Decreased strength, Difficulty walking  Visit Diagnosis: TMJ (temporomandibular joint syndrome) - Plan: PT plan of care cert/re-cert  Chronic jaw pain - Plan: PT plan of care cert/re-cert  Muscle weakness (generalized) - Plan: PT plan of care cert/re-cert     Problem List Patient Active Problem List   Diagnosis Date Noted   Tear film insufficiency 07/15/2021   Left lower quadrant abdominal pain 07/05/2021   Palpitations 06/11/2021   Axillary lymphadenopathy 06/11/2021   Immunosuppressed status ([place]) 04/19/2021   History of seronegative inflammatory arthritis 06/28/2020   Osteopenia of multiple sites 06/14/2020   Psoriatic arthritis ([place]) 06/12/2020   Iodine deficiency 06/12/2020   OSA on CPAP 06/12/2020   Chronic pain syndrome 06/12/2020    Psychophysiological insomnia 11/26/2019   Calcific tendinitis of right shoulder 04/10/2019   History of iron deficiency anemia 11/04/2018   History of basal cell carcinoma 10/07/2018   Vaginal dryness 09/07/2018   Jaw pain 07/15/2018   Chronic migraine with aura 07/15/2018   Irritable bowel syndrome with diarrhea 07/15/2018   Rectocele 05/21/2018   Ehlers-Danlos syndrome 03/15/2018   Patellar instability of left knee 03/15/2018   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   Instability of right shoulder joint 01/12/2018   Trigeminal neuralgia 12/25/2017   Instability of left shoulder joint 12/25/2017   Nausea 12/25/2017   Occipital neuralgia 12/25/2017   Dysautonomia ([place]) 12/25/2017   Hiatal hernia 10/14/2017   TMJ (dislocation of temporomandibular joint) 09/29/2017   Pulsatile tinnitus of left ear 12/12/2016   Restless leg syndrome 08/01/2016   Cervical dystonia 03/21/2016   Cervical spine disease 03/21/2016   Paresthesia 02/06/2016   Hypercholesterolemia 12/24/2015   Iron deficiency anemia 10/18/2015   Vitamin D deficiency disease 10/18/2015   Midline low back pain without sciatica 02/06/2015   Hyperglycemia 10/30/2014   History of colon cancer 09/27/2014   POTS (postural orthostatic tachycardia syndrome) 09/16/2003   Chronic fatigue 09/15/2000   Asthma 09/15/1988   Dyspnea  09/15/1988    Joneen Boers PT, DPT  08/26/2021, 1:51 PM  [facility] [address] [place], [address] Phone: [phone]   Fax:  [phone]  Name: KHERINGTON MERAZ MRN: [id number] Date of Birth: 19-Mar-1971

## 2021-08-26 NOTE — Patient Instructions (Signed)
Access Code: [email or web address] URL: [email or web address] Date: 08/26/2021 Prepared by: Joneen Boers  Exercises Standing Gluteal Sets - 1 x daily - 7 x weekly - 3 sets - 10 reps - 5 seconds hold Standing Transverse Abdominis Contraction - 1 x daily - 7 x weekly - 3 sets - 10 reps - 5 seconds hold

## 2021-08-26 NOTE — Progress Notes (Unsigned)
ROMA test for abn ovary on Gyn u/s.

## 2021-08-27 DIAGNOSIS — Z79899 Other long term (current) drug therapy: Secondary | ICD-10-CM | POA: Insufficient documentation

## 2021-08-27 DIAGNOSIS — M899 Disorder of bone, unspecified: Secondary | ICD-10-CM | POA: Insufficient documentation

## 2021-08-27 DIAGNOSIS — Z789 Other specified health status: Secondary | ICD-10-CM | POA: Insufficient documentation

## 2021-08-27 NOTE — Progress Notes (Signed)
Patient: Autumn Zhang  Service Category: E/M  Provider: Gaspar Cola, MD  DOB: 1971/04/11  DOS: 08/28/2021  Referring Provider: Virginia Crews, MD  MRN: [id number]  Setting: Ambulatory outpatient  PCP: Virginia Crews, MD  Type: New Patient  Specialty: Interventional Pain Management    Location: Office  Delivery: Face-to-face     Primary Reason(s) for Visit: Encounter for initial evaluation of one or more chronic problems (new to examiner) potentially causing chronic pain, and posing a threat to normal musculoskeletal function. (Level of risk: High) CC: Neck Pain  HPI  Autumn Zhang is a 50 y.o. year old, female patient, who comes for the first time to our practice referred by Virginia Crews, MD for our initial evaluation of her chronic pain. 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[place]); Midline low back pain without sciatica; Nausea; Cervicalgia; Occipital neuralgia; Paresthesia; Patellar instability (Left); Postural orthostatic tachycardia syndrome; Pulsatile tinnitus of ear (Left); Rectocele; Restless leg syndrome; TMJ (dislocation of temporomandibular joint); Dysautonomia ([place]); History of basal cell carcinoma; History of iron deficiency anemia; Calcific tendinitis of shoulder (Right); Psoriatic arthritis ([place]); Iodine deficiency; OSA on CPAP; Chronic pain syndrome; Osteopenia of multiple sites; History of seronegative inflammatory arthritis; Iron deficiency anemia; Irritable bowel syndrome with diarrhea; Vitamin D deficiency disease; Psychophysiological insomnia; Palpitations; Axillary lymphadenopathy; Left lower quadrant  abdominal pain; Immunosuppressed status ([place]); Tear film insufficiency; Anxiety, generalized; Difficulty walking; Headache disorder (3ry area of Pain); Photophobia; Speech disturbance; Pharmacologic therapy; Disorder of skeletal system; Problems influencing health status; Atypical facial pain (1ry area of Pain) (Left); Chronic ear pain (Left); Temporal headache (Left); Inner ear pain (Left); Trigeminal nerve disorder (Left); History of photophobia; TMJ (temporomandibular joint syndrome); Scintillating scotoma (Bilateral); Polypharmacy; Severe frontal headaches; Chronic occipital headache (Midline) (2ry area of Pain); Chronic neck pain (posterior) (4th area of Pain) (Bilateral) (L>R); Musculoskeletal disorder involving upper trapezius muscle; Musculoskeletal disorder involving sternocleidomastoid (Left); Abnormal MRI, cervical spine (02/11/2019); and Shoulder stiffness (Right) on their problem list. Today she comes in for evaluation of her Neck Pain  Pain Assessment: Location: Right, Left Neck Radiating: pain is worse on the left side Onset: More than a month ago Duration: Chronic pain Quality: Burning, Aching, Tingling, Crushing, Constant, Discomfort, Pressure, Pounding, Guarding, Nagging, Tender, Crying, Headache, Numbness, Sore, Throbbing, Heaviness, Radiating, Spasm, Tightness, Contraction, Dull, Penetrating, Restless, Squeezing, Cramping, Grimacing, Moaning, Pins and needles, Tiring Severity: 7 /10 (subjective, self-reported pain score)  Effect on ADL: limits my daily activities Timing: Constant Modifying factors: rest, SPG, ice, nasal, physical therapy BP: 129/85   HR: 87  Onset and Duration: Gradual and Present longer than 3 months Cause of pain:  "hypermobile EDS" Severity: Getting worse, NAS-11 at its worse: 9/10, NAS-11 at its best: 4/10, NAS-11 now: 7/10, and NAS-11 on the average: 6/10 Timing: During activity or exercise and After activity or exercise Aggravating Factors: Bending,  Climbing, Eating, Intercourse (sex), Lifiting, Motion, Prolonged sitting, Prolonged standing, Stooping , Walking, Walking uphill, Walking downhill, and Working Alleviating Factors: Cold packs, Medications, Resting, Using a brace, and physical therapy Associated Problems: Constipation, Dizziness, Fatigue, Inability to concentrate, Inability to control bladder (urine), Inability to control bowel, Nausea, Numbness, Spasms, Swelling, Tingling, Pain that wakes patient up, and Pain that does not allow patient to  sleep Quality of Pain: Aching, Agonizing, Annoying, Burning, Constant, Cruel, Deep, Disabling, Distressing, Dreadful, Dull, Exhausting, Fearful, Feeling of constriction, Feeling of weight, Heavy, Horrible, Itching, Nagging, Numb, Pressure-like, Pulsating, Sickening, Tender, Throbbing, Tingling, Tiring, Toothache-like, and Uncomfortable Previous Examinations or Tests: CT scan, EMG/PNCV, Endoscopy, MRI scan, Nerve block, X-rays, Nerve conduction test, Neurological evaluation, Neurosurgical evaluation, Orthopedic evaluation, and Psychiatric evaluation Previous Treatments: Chiropractic manipulations, Epidural steroid injections, Facet blocks, Narcotic medications, Physical Therapy, Pool exercises, Relaxation therapy, Steroid treatments by mouth, Stretching exercises, TENS, and Trigger point injections  According to the patient the primary area of pain is that of the left side of the face, anterior to the ear, with the pain being deep and radiating into the left jaw.  The patient also has pain over the V1, V2, V3 trigeminal nerve distributions.  This pain radiates to the temporal region, around the ear, and down the neck through the sternocleidomastoid muscle.  She describes inner ear pain which is dull, constant, waxes and wanes, with occasional sharp pain.  She will experience 2-3 episodes of the sharp pain per month.  The pain is associated with blurred vision, constant light sensitivity (photophobia) which  predates her jaw pain, scintillating scotoma's, tinnitus and she has been told that she has trigeminal neuralgia.  She indicates having had a CT angiogram done around 2018, but no MRIs looking at the trigeminal nerve (gasserian ganglion).  She denies any surgeries in that area but does indicate having had multiple evaluations and nerve blocks through the years.  The jaw pain is also associated with a diagnosis of temporomandibular joint syndrome and recurrent temporomandibular joint dislocations.  The patient's secondary area pain is that of the back of the head in the occipital region mostly in the midline causing headaches that follow the distribution of the greater occipital nerve bilaterally and lesser occipital nerve on the left side.  She indicates having been told that she has craniocervical instability but denies any rheumatoid arthritis.  She does have a rheumatologist at [facility].  These occipital headaches are associated with left jaw pain and they have been evaluated by neurosurgeons and neurologist.  The patient's third area pain is that of headaches with the patient describing what she calls different types of headaches.  The first headache she describes as being in the occipital region, midline, with radiation towards the front, bilaterally.  The second headache she describes in the top of the head, bilaterally, associated also with the occipital headaches.  She describes this pain to have a late day onset and to remain there until she goes to sleep.  The third type of headache is unilateral on the left side affecting the temporal region (Left) of the face.  She describes this headache to be constant while the other 2 are intermittent.  The fourth type of headache is described as a spike headache that will usually be unilateral and lasting only for an hour.  She describes that most of these headaches will not respond to medication for migraines, except when she does have an actual migraine in which  case they tend to improve.  The patient's fourth area pain is that of the neck (Bilateral) (L>R) (posterior).  She describes this as being different on the right side and on the left side.  In the case of the left side the neck pain tends to be more on the lateral aspect of the neck following the sternocleidomastoid muscle.  In the case of the right side it is more of a  muscle tightness that involves the shoulder and what appears to be the trapezius muscle.  She describes that this pain on the right side and associated with popping sensations and a sharp pain depending on how she moves the neck.  She also refers having had some nerve conduction test indicating that she has dystonia.  The patient indicates having had multiple imaging studies, multiple nerve blocks, multiple nerve conduction test, and the medical record seems to confirm that she has had those done.  Unfortunately, the reports and none of those are available since the medical record indicates "reference only".  The patient denies any upper extremity symptoms, neck surgery, and indicates having had physical therapy for the neck pain in multiple locations and she is actually currently involved in some physical therapy at this point.  The patient has an Ehlers-Danlos syndrome, and multiple other medical problems.  The patient was initially seen by Dr. Gillis Santa, whose initial evaluation reads as follows: "50 year old female with a history of Ehlers-Danlos syndrome, mast cell activation syndrome, joint hypermobility, cervical dystonia, chronic migraines, trigeminal neuralgia, TMJ dysfunction, pots syndrome who presents with a chief complaint of generalized body aches as well as headaches.  Patient has a complex medical history.  She was previously in the [place] area.  For her complex migraines, she has tried occipital nerve blocks as well as SPG blocks.  She states that the SPG blocks were somewhat helpful initially.  She is hoping  to establish with pain management to find a plan for when she has pain flares.  Patient would like to avoid opioid therapy.  She cannot tolerate NSAIDs given her mast cell syndrome.  In the past she states that her LFTs have been elevated but that Tylenol provides her with the most pain relief.  She takes 500 mg daily.  She states that when her LFTs were elevated she was taking 2 to 3 g daily for 3 months.  Muscle relaxants are not indicated especially in the context of her Ehlers-Danlos and joint hypermobility.  Patient does participate in physical therapy once to twice a week.  She avoids heavy resistance and isometric exercises and participates in low impact exercises to minimize bone and joint damage."  Today I took the time to provide the patient with information regarding my pain practice. The patient was informed that my practice is divided into two sections: an interventional pain management section, as well as a completely separate and distinct medication management section. I explained that I have procedure days for my interventional therapies, and evaluation days for follow-ups and medication management. Because of the amount of documentation required during both, they are kept separated. This means that there is the possibility that she may be scheduled for a procedure on one day, and medication management the next. I have also informed her that because of staffing and facility limitations, I no longer take patients for medication management only. To illustrate the reasons for this, I gave the patient the example of surgeons, and how inappropriate it would be to refer a patient to his/her care, just to write for the post-surgical antibiotics on a surgery done by a different surgeon.   Because interventional pain management is my board-certified specialty, the patient was informed that joining my practice means that they are open to any and all interventional therapies. I made it clear that this does  not mean that they will be forced to have any procedures done. What this means is that I believe interventional therapies  to be essential part of the diagnosis and proper management of chronic pain conditions. Therefore, patients not interested in these interventional alternatives will be better served under the care of a different practitioner.  The patient was also made aware of my Comprehensive Pain Management Safety Guidelines where by joining my practice, they limit all of their nerve blocks and joint injections to those done by our practice, for as long as we are retained to manage their care.   Historic Controlled Substance Pharmacotherapy Review  PMP and historical list of controlled substances: Dextroamphetamine/amphetamine 5 mg tablet 1 twice daily; Vimpat 50 mg tablet 1 twice daily Current opioid analgesics: None MME/day: 0 mg/day  Historical Monitoring: The patient  reports no history of drug use. List of all UDS Test(s): No results found for: MDMA, COCAINSCRNUR, [place], [place], CANNABQUANT, THCU, [place] List of other Serum/Urine Drug Screening Test(s):  No results found for: AMPHSCRSER, BARBSCRSER, BENZOSCRSER, COCAINSCRSER, COCAINSCRNUR, PCPSCRSER, PCPQUANT, THCSCRSER, THCU, CANNABQUANT, OPIATESCRSER, OXYSCRSER, PROPOXSCRSER, ETH Historical Background Evaluation: [place] PMP: PDMP reviewed during this encounter. Online review of the past 5-month period conducted.             PMP NARX Score Report:  Narcotic: 010 Sedative: 020 Stimulant: 070 [place] Department of public safety, offender search: Editor, commissioning Information) Non-contributory Risk Assessment Profile: Aberrant behavior: None observed or detected today Risk factors for fatal opioid overdose: None identified today PMP NARX Overdose Risk Score: 110 Fatal overdose hazard ratio (HR): Calculation deferred Non-fatal overdose hazard ratio (HR): Calculation deferred Risk of opioid abuse or dependence: 0.7-3.0% with doses ? 36 MME/day and  6.1-26% with doses ? 120 MME/day. Substance use disorder (SUD) risk level: See below Personal History of Substance Abuse (SUD-Substance use disorder):  Alcohol: Negative  Illegal Drugs: Negative  Rx Drugs: Negative  ORT Risk Level calculation: Low Risk  Opioid Risk Tool - 08/28/21 1357       Family History of Substance Abuse   Alcohol Negative    Illegal Drugs Negative    Rx Drugs Negative      Personal History of Substance Abuse   Alcohol Negative    Illegal Drugs Negative    Rx Drugs Negative      Age   Age between 69-45 years  No      History of Preadolescent Sexual Abuse   History of Preadolescent Sexual Abuse Negative or Female      Psychological Disease   Psychological Disease Negative    Depression Negative      Total Score   Opioid Risk Tool Scoring 0    Opioid Risk Interpretation Low Risk            ORT Scoring interpretation table:  Score <3 = Low Risk for SUD  Score between 4-7 = Moderate Risk for SUD  Score >8 = High Risk for Opioid Abuse   PHQ-2 Depression Scale:  Total score:    PHQ-2 Scoring interpretation table: (Score and probability of major depressive disorder)  Score 0 = No depression  Score 1 = 15.4% Probability  Score 2 = 21.1% Probability  Score 3 = 38.4% Probability  Score 4 = 45.5% Probability  Score 5 = 56.4% Probability  Score 6 = 78.6% Probability   PHQ-9 Depression Scale:  Total score:    PHQ-9 Scoring interpretation table:  Score 0-4 = No depression  Score 5-9 = Mild depression  Score 10-14 = Moderate depression  Score 15-19 = Moderately severe depression  Score 20-27 = Severe depression (2.4 times higher  risk of SUD and 2.89 times higher risk of overuse)   Pharmacologic Plan: As per protocol, I have not taken over any controlled substance management, pending the results of ordered tests and/or consults.            Initial impression: Pending review of available data and ordered tests.  Meds   Current Outpatient  Medications:    acetaminophen (TYLENOL) 500 MG tablet, Take 500 mg by mouth every 6 (six) hours as needed., Disp: , Rfl:    albuterol (PROVENTIL HFA;VENTOLIN HFA) 108 (90 Base) MCG/ACT inhaler, Inhale 2 puffs into the lungs every 4 (four) hours as needed. , Disp: , Rfl:    Ascorbic Acid (VITAMIN C) 1000 MG tablet, Take 1,000 mg by mouth daily. probably total is 900 mg, Disp: , Rfl:    aspirin 325 MG tablet, Take by mouth., Disp: , Rfl:    augmented betamethasone dipropionate (DIPROLENE-AF) 0.05 % cream, APPLY TWICE DAILY AS NEEDED TO THE AFFECTED AREAS FOR 5-7 DAYS FOR ECZEMA., Disp: , Rfl:    b complex vitamins capsule, Take 1 capsule by mouth daily. 1/4 capsule a day, Disp: , Rfl:    BELSOMRA 5 MG TABS, Take 1 tablet by mouth at bedtime as needed. , Disp: , Rfl:    Blood Pressure Monitoring (BLOOD PRESSURE CUFF) MISC, Take blood pressure as needed for symptoms., Disp: , Rfl:    Boswellia Serrata (BOSWELLIA PO), Take 150 mg by mouth every morning. , Disp: , Rfl:    Calcium Citrate 200 MG TABS, Take 600 mg by mouth., Disp: , Rfl:    cromolyn (GASTROCROM) 100 MG/5ML solution, Take by mouth 4 (four) times daily -  before meals and at bedtime., Disp: , Rfl:    Cyanocobalamin (VITAMIN B-12) 2500 MCG SUBL, Place under the tongue., Disp: , Rfl:    diphenhydrAMINE (BENADRYL) 12.5 MG/5ML liquid, Take by mouth 4 (four) times daily as needed., Disp: , Rfl:    diphenhydrAMINE-Allantoin 2-0.5 % CREA, Apply 1 application topically as needed. , Disp: , Rfl:    docusate sodium (COLACE) 100 MG capsule, Take 100 mg by mouth every other day. , Disp: , Rfl:    econazole nitrate 1 % cream, APPLY TO FEET TWICE A DAY FOR 2 4 WEEKS AS NEEDED FOR FLARE, Disp: , Rfl:    EPINEPHrine 0.3 mg/0.3 mL IJ SOAJ injection, INJECT ONE AUTO-INJECTOR INTRAMUSCULARLY AS NEEDED, Disp: , Rfl:    estradiol (ESTRACE) 0.1 MG/GM vaginal cream, INSERT 1 GRAM VAGINALLY TWICE WEEKLY, Disp: 42.5 g, Rfl: 2   famotidine (PEPCID) 10 MG tablet,  Take 10 mg by mouth daily. , Disp: , Rfl:    fexofenadine (ALLEGRA) 180 MG tablet, , Disp: , Rfl:    guaifenesin (HUMIBID E) 400 MG TABS tablet, Take 400 mg by mouth daily. , Disp: , Rfl:    IODINE EX, Apply 225 mcg topically. Every 3 days, Disp: , Rfl:    KETAMINE HCL SL, Place into right nostril as needed., Disp: , Rfl:    ketotifen (ZADITOR) 0.025 % ophthalmic solution, Place 1 drop into both eyes daily as needed., Disp: , Rfl:    Ketotifen Fumarate POWD, 1 mg by Does not apply route. 5-6 times a day, Disp: , Rfl:    L-Lysine 500 MG CAPS, Take by mouth 2 (two) times daily., Disp: , Rfl:    L-Theanine 100 MG CAPS, Take 200 mg by mouth daily. , Disp: , Rfl:    levocetirizine (XYZAL) 2.5 MG/5ML solution, , Disp: ,  Rfl:    Light Mineral Oil-Mineral Oil 0.5-0.5 % EMUL, Apply 1 application to eye daily., Disp: , Rfl:    lipase/protease/amylase (CREON) 12000-38000 units CPEP capsule, Take 12,000 Units by mouth as directed., Disp: , Rfl:    loperamide (IMODIUM) 2 MG capsule, Take by mouth as needed for diarrhea or loose stools., Disp: , Rfl:    Magnesium Citrate POWD, Take 435 mg by mouth daily., Disp: , Rfl:    Melatonin 1 MG/ML LIQD, Take by mouth daily. , Disp: , Rfl:    montelukast (SINGULAIR) 5 MG chewable tablet, , Disp: , Rfl:    naproxen (NAPROSYN) 250 MG tablet, Take 250 mg by mouth 3 days., Disp: , Rfl:    Non Gelatin Capsules, Empty, (CAPSULE #3 CLEAR/CLEAR VEG) CAPS, Take 1 capsule by mouth daily., Disp: , Rfl:    NONFORMULARY OR COMPOUNDED ITEM, Bupivacaine nasal 0.5% drops w/o epi, Disp: , Rfl:    omega-3 acid ethyl esters (LOVAZA) 1 g capsule, Take 2 capsules (2 g total) by mouth 2 (two) times daily., Disp: 360 capsule, Rfl: 3   pseudoephedrine (SUDAFED) 30 MG tablet, Take 30 mg by mouth every 4 (four) hours as needed for congestion. , Disp: , Rfl:    pyridostigmine (MESTINON) 60 MG tablet, Take 15 mg by mouth 3 (three) times daily., Disp: , Rfl:    Quercetin 500 MG CAPS, Take by  mouth daily., Disp: , Rfl:    RESTASIS 0.05 % ophthalmic emulsion, INSTILL 1 DROP INTO BOTH EYES TWICE A DAY, Disp: 180 mL, Rfl: 1   Riboflavin (VITAMIN B-2 PO), Take 100 mg by mouth 3 (three) times daily., Disp: , Rfl:    Rimegepant Sulfate (NURTEC) 75 MG TBDP, Take 1 tablet by mouth as needed., Disp: , Rfl:    Selenium 200 MCG CAPS, Take 1 capsule by mouth once a week., Disp: , Rfl:    simethicone (MYLICON) 80 MG chewable tablet, Chew 80 mg by mouth every 6 (six) hours as needed for flatulence., Disp: , Rfl:    Sodium Fluoride 1.1 % PSTE, Place onto teeth., Disp: , Rfl:    TRIAMCINOLONE ACETONIDE,NASAL, NA, Place into the nose as needed., Disp: , Rfl:    trolamine salicylate (ASPERCREME/ALOE) 10 % cream, Apply topically., Disp: , Rfl:    UBRELVY 50 MG TABS, TAKE 1 TABLET BY MOUTH ONCE AS NEEDED FOR UP TO 1 DOSE, Disp: , Rfl:    Vitamin D, Cholecalciferol, 10 MCG (400 UNIT) TABS, Take by mouth daily. , Disp: , Rfl:   Imaging Review  Foot Imaging: Foot-R DG Complete: Results for orders placed in visit on 03/15/20 DG Foot Complete Right  Narrative Please see detailed radiograph report in office note.  Foot-L DG Complete: Results for orders placed in visit on 03/15/20 DG Foot Complete Left  Narrative Please see detailed radiograph report in office note.  Complexity Note: Imaging results reviewed. Results shared with Autumn Zhang, using Layman's terms.                        ROS  Cardiovascular: High blood pressure and Chest pain Pulmonary or Respiratory: Wheezing and difficulty taking a deep full breath (Asthma), Shortness of breath, Snoring , and Temporary stoppage of breathing during sleep Neurological: Curved spine and Incontinence:  Urinary and Fecal Psychological-Psychiatric: Anxiousness and Difficulty sleeping and or falling asleep Gastrointestinal: Reflux or heatburn and Alternating episodes iof diarrhea and constipation (IBS-Irritable bowe syndrome) Genitourinary: Recurrent  Urinary Tract infections Hematological: Weakness  due to low blood hemoglobin or red blood cell count (Anemia) and Brusing easily Endocrine: No reported endocrine signs or symptoms such as high or low blood sugar, rapid heart rate due to high thyroid levels, obesity or weight gain due to slow thyroid or thyroid disease Rheumatologic: Generalized muscle aches (Fibromyalgia) Musculoskeletal: Negative for myasthenia gravis, muscular dystrophy, multiple sclerosis or malignant hyperthermia Work History: Disabled  Allergies  Autumn Zhang is allergic to iodinated diagnostic agents, ioversol, ciprofloxacin, eggs or egg-derived products, ginger, levofloxacin, other, soy allergy, soybean-containing drug products, bee pollen, bupropion, celebrex [celecoxib], codeine, dexilant [dexlansoprazole], dulera [mometasone furo-formoterol fum], fentanyl, fluticasone, gabapentin, gluten meal, lidocaine, lyrica [pregabalin], mold extract [trichophyton], morphine and related, penicillins, pollen extract, prilosec [omeprazole], rizatriptan, salmeterol xinafoate, silenor [doxepin hcl], stevioside, tomato, xylitol, brewer's dried yeast [yeast], ethanol, hydrocodone, ibuprofen, lac bovis, lanolin, milk-related compounds, morphine, wheat bran, and yeast-related products.  Laboratory Chemistry Profile   Renal Lab Results  Component Value Date   BUN 16 07/02/2021   CREATININE 0.62 07/02/2021   BCR 17 06/18/2021   GFRAA 119 06/12/2020   GFRNONAA >60 07/02/2021   SPECGRAV 1.020 08/02/2020   PHUR 6.0 08/02/2020   PROTEINUR Negative 08/02/2020     Electrolytes Lab Results  Component Value Date   NA 140 07/02/2021   K 3.8 07/02/2021   CL 106 07/02/2021   CALCIUM 9.5 07/02/2021   MG 2.2 08/28/2021     Hepatic Lab Results  Component Value Date   AST 23 07/02/2021   ALT 18 07/02/2021   ALBUMIN 4.3 07/02/2021   ALKPHOS 90 07/02/2021     ID Lab Results  Component Value Date   HIV Non Reactive 06/12/2020    SARSCOV2NAA NEGATIVE 07/01/2019   PREGTESTUR NEGATIVE 08/14/2021     Bone Lab Results  Component Value Date   VD25OH 32.8 06/12/2020   25OHVITD1 WILL FOLLOW 08/28/2021   25OHVITD2 WILL FOLLOW 08/28/2021   25OHVITD3 WILL FOLLOW 08/28/2021     Endocrine Lab Results  Component Value Date   GLUCOSE 103 (H) 07/02/2021   GLUCOSEU NEGATIVE 11/18/2019   HGBA1C 5.1 06/18/2021   TSH 1.770 06/18/2021   FREET4 1.13 06/12/2020     Neuropathy Lab Results  Component Value Date   VITAMINB12 589 05/06/2021   FOLATE 25.0 09/02/2018   HGBA1C 5.1 06/18/2021   HIV Non Reactive 06/12/2020     CNS No results found for: COLORCSF, APPEARCSF, RBCCOUNTCSF, WBCCSF, POLYSCSF, LYMPHSCSF, EOSCSF, PROTEINCSF, GLUCCSF, JCVIRUS, CSFOLI, IGGCSF, LABACHR, ACETBL, LABACHR, ACETBL   Inflammation (CRP: Acute   ESR: Chronic) Lab Results  Component Value Date   ESRSEDRATE 34 01/04/2020     Rheumatology No results found for: RF, ANA, LABURIC, URICUR, LYMEIGGIGMAB, LYMEABIGMQN, HLAB27   Coagulation Lab Results  Component Value Date   PLT 311 07/02/2021   VITAMINK1 Comment 08/22/2021     Cardiovascular Lab Results  Component Value Date   HGB 13.0 07/02/2021   HCT 38.5 07/02/2021     Screening Lab Results  Component Value Date   SARSCOV2NAA NEGATIVE 07/01/2019   HIV Non Reactive 06/12/2020   PREGTESTUR NEGATIVE 08/14/2021     Cancer No results found for: CEA, CA125, LABCA2   Allergens No results found for: ALMOND, APPLE, ASPARAGUS, AVOCADO, BANANA, BARLEY, BASIL, BAYLEAF, GREENBEAN, LIMABEAN, WHITEBEAN, BEEFIGE, REDBEET, BLUEBERRY, BROCCOLI, CABBAGE, MELON, CARROT, CASEIN, CASHEWNUT, CAULIFLOWER, CELERY     Note: Lab results reviewed.  [place]  Drug: Autumn Zhang  reports no history of drug use. Alcohol:  reports no history of alcohol use. Tobacco:  reports that she has quit smoking. Her smoking use included cigarettes. She has never used smokeless tobacco. Medical:  has a past medical  history of Allergy, Anemia, Anxiety, Arthritis, Asthma, Attention deficit hyperactivity disorder (ADHD), combined type (10/18/2015), Basal cell carcinoma (05/16/2021), BRCA negative (09/2018), Colon cancer (HCC), Dysautonomia (HCC), Ehlers-Danlos syndrome type III, Family history of colon cancer, GERD (gastroesophageal reflux disease), Hyperlipidemia, Idiopathic small fiber peripheral neuropathy, Leiomyoma, Mast cell activation (HCC), Migraines, Neck pain, Occipital neuralgia, and Sleep apnea. Family: family history includes Breast cancer (age of onset: 23) in her maternal aunt; Cataracts in her mother; Colon cancer (age of onset: 28) in her father; Colon cancer (age of onset: 61) in her maternal grandmother; Colon polyps in her brother; Endometrial cancer (age of onset: 33) in her maternal aunt; Fibromyalgia in her mother; Food Allergy in her maternal grandfather; Glaucoma in her mother; Heart disease in her mother; Leukemia (age of onset: 1) in her maternal grandfather; Migraines in her maternal grandmother and mother; Tremor in her maternal grandmother and mother.  Past Surgical History:  Procedure Laterality Date   APPENDECTOMY     removed with colectomy   BREAST SURGERY     reduction   COLON SURGERY  2008   for colon cancer, ~1/3 of colon removed, primary reanastimosis   COLONOSCOPY WITH PROPOFOL N/A 10/07/2018   Procedure: COLONOSCOPY WITH PROPOFOL;  Surgeon: Toney Reil, MD;  Location: [facility] ENDOSCOPY;  Service: Gastroenterology;  Laterality: N/A;   COLONOSCOPY WITH PROPOFOL N/A 08/14/2021   Procedure: COLONOSCOPY WITH PROPOFOL;  Surgeon: Toney Reil, MD;  Location: [facility] ENDOSCOPY;  Service: Gastroenterology;  Laterality: N/A;   ENDOMETRIAL ABLATION     trigeminal stimulator     Active Ambulatory Problems    Diagnosis Date Noted   Vaginal dryness 09/07/2018   Chronic jaw pain (Left) 07/15/2018   Asthma 09/15/1988   Cervical dystonia 03/21/2016   Cervical spine disease  03/21/2016   Chronic migraine with aura 07/15/2018   History of colon cancer 09/27/2014   Dyspnea 09/15/1988   Ehlers-Danlos syndrome 03/15/2018   Trigeminal neuralgia (V1-3) (Left) 12/25/2017   Chronic fatigue 09/15/2000   Hiatal hernia 10/14/2017   Hypercholesterolemia 12/24/2015   Instability of shoulder joint (Left) 12/25/2017   Instability of shoulder joint (Right) 01/12/2018   Hyperglycemia 10/30/2014   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   Midline low back pain without sciatica 02/06/2015   Nausea 12/25/2017   Cervicalgia 02/06/2016   Occipital neuralgia 12/25/2017   Paresthesia 02/06/2016   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   Postural orthostatic tachycardia syndrome 09/16/2003   Pulsatile tinnitus of ear (Left) 12/12/2016   Rectocele 05/21/2018   Restless leg syndrome 08/01/2016   TMJ (dislocation of temporomandibular joint) 09/29/2017   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   History of basal cell carcinoma 10/07/2018   History of iron deficiency anemia 11/04/2018   Calcific tendinitis of shoulder (Right) 04/10/2019   Psoriatic arthritis (HCC) 06/12/2020   Iodine deficiency 06/12/2020   OSA on CPAP 06/12/2020   Chronic pain syndrome 06/12/2020   Osteopenia of multiple sites 06/14/2020   History of seronegative inflammatory arthritis 06/28/2020   Iron deficiency anemia 10/18/2015   Irritable bowel syndrome with diarrhea 07/15/2018   Vitamin D deficiency disease 10/18/2015   Psychophysiological insomnia 11/26/2019   Palpitations 06/11/2021   Axillary lymphadenopathy 06/11/2021   Left lower quadrant abdominal pain 07/05/2021   Immunosuppressed status (HCC) 04/19/2021   Tear film insufficiency 07/15/2021   Anxiety, generalized 07/17/2021   Difficulty walking 07/17/2021  Headache disorder (3ry area of Pain) 07/17/2021   Photophobia 07/17/2021   Speech disturbance 07/17/2021   Pharmacologic therapy 08/27/2021   Disorder of skeletal system 08/27/2021    Problems influencing health status 08/27/2021   Atypical facial pain (1ry area of Pain) (Left) 08/29/2021   Chronic ear pain (Left) 08/29/2021   Temporal headache (Left) 08/29/2021   Inner ear pain (Left) 08/29/2021   Trigeminal nerve disorder (Left) 08/29/2021   History of photophobia 08/29/2021   TMJ (temporomandibular joint syndrome) 08/29/2021   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   Polypharmacy 08/29/2021   Severe frontal headaches 08/29/2021   Chronic occipital headache (Midline) (2ry area of Pain) 08/29/2021   Chronic neck pain (posterior) (4th area of Pain) (Bilateral) (L>R) 08/29/2021   Musculoskeletal disorder involving upper trapezius muscle 08/29/2021   Musculoskeletal disorder involving sternocleidomastoid (Left) 08/29/2021   Abnormal MRI, cervical spine (02/11/2019) 08/29/2021   Shoulder stiffness (Right) 08/29/2021   Resolved Ambulatory Problems    Diagnosis Date Noted   Absent gag reflex 12/25/2017   Attention deficit hyperactivity disorder (ADHD), combined type 10/18/2015   Bursitis 09/15/2005   Chondral lesion 02/12/2018   History of sleep apnea 02/06/2016   Hx of migraine headaches 02/06/2016   Idiopathic small fiber peripheral neuropathy 05/21/2018   Adhesive capsulitis of right shoulder 04/13/2019   Migraine without aura and without status migrainosus, not intractable 11/17/2019   Past Medical History:  Diagnosis Date   Allergy    Anemia    Anxiety    Arthritis    Basal cell carcinoma 05/16/2021   BRCA negative 09/2018   Colon cancer ([place])    Ehlers-Danlos syndrome type III    Family history of colon cancer    GERD (gastroesophageal reflux disease)    Hyperlipidemia    Leiomyoma    Mast cell activation (HCC)    Migraines    Neck pain    Sleep apnea    Constitutional Exam  General appearance: Well nourished, well developed, and well hydrated. In no apparent acute distress Vitals:   08/28/21 1344  BP: 129/85  Pulse: 87  Resp: 16   Temp: (!) 96.7 F (35.9 C)  SpO2: 100%  Weight: 142 lb (64.4 kg)  Height: $Remove'5\' 2"'fByHrwX$  (1.575 m)   BMI Assessment: Estimated body mass index is 25.97 kg/m as calculated from the following:   Height as of this encounter: $RemoveBeforeD'5\' 2"'ThaVbuQcJUrvxw$  (1.575 m).   Weight as of this encounter: 142 lb (64.4 kg).  BMI interpretation table: BMI level Category Range association with higher incidence of chronic pain  <18 kg/m2 Underweight   18.5-24.9 kg/m2 Ideal body weight   25-29.9 kg/m2 Overweight Increased incidence by 20%  30-34.9 kg/m2 Obese (Class I) Increased incidence by 68%  35-39.9 kg/m2 Severe obesity (Class II) Increased incidence by 136%  >40 kg/m2 Extreme obesity (Class III) Increased incidence by 254%   Patient's current BMI Ideal Body weight  Body mass index is 25.97 kg/m. Ideal body weight: 50.1 kg (110 lb 7.2 oz) Adjusted ideal body weight: 55.8 kg (123 lb 1.1 oz)   BMI Readings from Last 4 Encounters:  08/28/21 25.97 kg/m  08/15/21 26.34 kg/m  08/14/21 25.97 kg/m  07/26/21 26.52 kg/m   Wt Readings from Last 4 Encounters:  08/28/21 142 lb (64.4 kg)  08/15/21 144 lb (65.3 kg)  08/14/21 142 lb (64.4 kg)  07/26/21 145 lb (65.8 kg)    Psych/Mental status: Alert, oriented x 3 (person, place, & time)       Eyes: PERLA  Respiratory: No evidence of acute respiratory distress  Assessment  Primary Diagnosis & Pertinent Problem List: The primary encounter diagnosis was Atypical facial pain (1ry area of Pain) (Left). Diagnoses of Trigeminal neuralgia (V1-3) (Left), Trigeminal nerve disorder (Left), Chronic ear pain (Left), Inner ear pain (Left), Temporal headache (Left), TMJ (temporomandibular joint syndrome), Chronic occipital headache (Midline) (2ry area of Pain), Cervical spine disease, Cervical dystonia, Headache disorder (3ry area of Pain), Severe frontal headaches, Scintillating scotoma, bilateral, History of photophobia, Chronic neck pain (posterior) (4th area of Pain) (Bilateral) (L>R),  Musculoskeletal disorder involving sternocleidomastoid (Left), Musculoskeletal disorder involving upper trapezius muscle, Chronic pain syndrome, Pharmacologic therapy, Polypharmacy, Disorder of skeletal system, Problems influencing health status, and Ehlers-Danlos syndrome were also pertinent to this visit.  Visit Diagnosis (New problems to examiner): 1. Atypical facial pain (1ry area of Pain) (Left)   2. Trigeminal neuralgia (V1-3) (Left)   3. Trigeminal nerve disorder (Left)   4. Chronic ear pain (Left)   5. Inner ear pain (Left)   6. Temporal headache (Left)   7. TMJ (temporomandibular joint syndrome)   8. Chronic occipital headache (Midline) (2ry area of Pain)   9. Cervical spine disease   10. Cervical dystonia   11. Headache disorder (3ry area of Pain)   12. Severe frontal headaches   13. Scintillating scotoma, bilateral   14. History of photophobia   15. Chronic neck pain (posterior) (4th area of Pain) (Bilateral) (L>R)   16. Musculoskeletal disorder involving sternocleidomastoid (Left)   17. Musculoskeletal disorder involving upper trapezius muscle   18. Chronic pain syndrome   19. Pharmacologic therapy   20. Polypharmacy   21. Disorder of skeletal system   22. Problems influencing health status   23. Ehlers-Danlos syndrome    Plan of Care (Initial workup plan)  Note: Autumn Zhang was reminded that as per protocol, today's visit has been an evaluation only. We have not taken over the patient's controlled substance management.  Problem-specific plan: No problem-specific Assessment & Plan notes found for this encounter.  Lab Orders         Compliance Drug Analysis, Ur         Magnesium         25-Hydroxy vitamin D Lcms D2+D3     Imaging Orders         DG Cervical Spine With Flex & Extend     Referral Orders  No referral(s) requested today   Procedure Orders    No procedure(s) ordered today   Pharmacotherapy (current): Medications ordered:  No orders of the defined  types were placed in this encounter.  Medications administered during this visit: Autumn Zhang had no medications administered during this visit.   Pharmacological management options:  Opioid Analgesics: The patient was informed that there is no guarantee that she would be a candidate for opioid analgesics. The decision will be made following CDC guidelines. This decision will be based on the results of diagnostic studies, as well as Autumn Zhang risk profile.   Membrane stabilizer: To be determined at a later time  Muscle relaxant: To be determined at a later time  NSAID: To be determined at a later time  Other analgesic(s): To be determined at a later time   Interventional management options: Autumn Zhang was informed that there is no guarantee that she would be a candidate for interventional therapies. The decision will be based on the results of diagnostic studies, as well as Autumn Zhang risk profile.  Procedure(s) under consideration:  Pending  results of ordered studies      Interventional Therapies  Risk   Complexity Considerations:   Estimated body mass index is 25.97 kg/m as calculated from the following:   Height as of this encounter: $RemoveBeforeD'5\' 2"'xKpgzvuVvScRzS$  (1.575 m).   Weight as of this encounter: 142 lb (64.4 kg). WNL   Planned   Pending:   Pending further evaluation   Under consideration:   Pending further evaluation   Completed by other practices:   Cervical epidural steroid injections  Cervical facet blocks  Sphenopalatine ganglion blocks  Botox injections  Occipital nerve blocks  Right subacromial bursa injection by Rosalia Hammers, DO (01/12/2019)  Therapeutic right shoulder Large Volume Glenohumeral Joint Injection w/ manipulation by Robet Leu, MD (07/08/2019)  Diagnostic Punch skin biopsy for epidermal nerve fiber density testing by [facility] (06/29/2019)   Completed:   None at this time   Therapeutic   Palliative (PRN) options:   None  established    Provider-requested follow-up: Return in about 6 weeks (around 10/09/2021) for (8min), Eval-day (M,W), (F2F), 2nd Visit, for review of ordered tests.  Future Appointments  Date Time Provider [place]  09/03/2021 11:00 AM Madaline Savage, [place] [facility] None  09/11/2021  9:30 AM Joneen Boers R, PT [facility] None  09/17/2021 11:00 AM Joneen Boers R, PT [facility] None  09/24/2021 11:00 AM Laygo, Kittie Plater R, PT [facility] None  09/25/2021  1:30 PM [facility] None  09/26/2021 10:00 AM [place] Crews, MD [facility]  10/01/2021 11:00 AM Joneen Boers R, PT [facility] None  10/08/2021 11:00 AM Madaline Savage, PT [facility] None  10/09/2021 11:40 AM Milinda Pointer, MD [facility] None  10/15/2021 11:00 AM Madaline Savage, PT [facility] None  12/25/2021 12:45 PM [facility] None  12/25/2021  1:15 PM Sindy Guadeloupe, MD [facility] None  12/31/2021 10:00 AM Brendolyn Patty, MD [facility] None  06/13/2022  9:00 AM Bacigalupo, Dionne Bucy, MD [facility]    Note by: Gaspar Cola, MD Date: 08/28/2021; Time: 5:55 PM

## 2021-08-28 ENCOUNTER — Ambulatory Visit (HOSPITAL_BASED_OUTPATIENT_CLINIC_OR_DEPARTMENT_OTHER): Payer: Medicare Other | Admitting: Pain Medicine

## 2021-08-28 ENCOUNTER — Ambulatory Visit
Admission: RE | Admit: 2021-08-28 | Discharge: 2021-08-28 | Disposition: A | Discharge status: Home / Self Care | Payer: Medicare Other | Source: Ambulatory Visit | Attending: Pain Medicine | Admitting: Pain Medicine

## 2021-08-28 ENCOUNTER — Ambulatory Visit
Admission: RE | Admit: 2021-08-28 | Discharge: 2021-08-28 | Disposition: A | Discharge status: Home / Self Care | Payer: Medicare Other | Attending: Pain Medicine | Admitting: Pain Medicine

## 2021-08-28 ENCOUNTER — Other Ambulatory Visit: Payer: Self-pay

## 2021-08-28 ENCOUNTER — Encounter: Payer: Self-pay | Admitting: Pain Medicine

## 2021-08-28 VITALS — BP 129/85 | HR 87 | Temp 96.7°F | Resp 16 | Ht 62.0 in | Wt 142.0 lb

## 2021-08-28 DIAGNOSIS — Z79899 Other long term (current) drug therapy: Secondary | ICD-10-CM

## 2021-08-28 DIAGNOSIS — M26609 Unspecified temporomandibular joint disorder, unspecified side: Secondary | ICD-10-CM | POA: Insufficient documentation

## 2021-08-28 DIAGNOSIS — G500 Trigeminal neuralgia: Secondary | ICD-10-CM

## 2021-08-28 DIAGNOSIS — G894 Chronic pain syndrome: Secondary | ICD-10-CM

## 2021-08-28 DIAGNOSIS — Q796 Ehlers-Danlos syndrome, unspecified: Secondary | ICD-10-CM

## 2021-08-28 DIAGNOSIS — G509 Disorder of trigeminal nerve, unspecified: Secondary | ICD-10-CM

## 2021-08-28 DIAGNOSIS — M542 Cervicalgia: Secondary | ICD-10-CM

## 2021-08-28 DIAGNOSIS — M899 Disorder of bone, unspecified: Secondary | ICD-10-CM | POA: Insufficient documentation

## 2021-08-28 DIAGNOSIS — R519 Headache, unspecified: Secondary | ICD-10-CM

## 2021-08-28 DIAGNOSIS — M489 Spondylopathy, unspecified: Secondary | ICD-10-CM | POA: Insufficient documentation

## 2021-08-28 DIAGNOSIS — H53123 Transient visual loss, bilateral: Secondary | ICD-10-CM

## 2021-08-28 DIAGNOSIS — H9202 Otalgia, left ear: Secondary | ICD-10-CM

## 2021-08-28 DIAGNOSIS — G501 Atypical facial pain: Secondary | ICD-10-CM

## 2021-08-28 DIAGNOSIS — G8929 Other chronic pain: Secondary | ICD-10-CM

## 2021-08-28 DIAGNOSIS — Z789 Other specified health status: Secondary | ICD-10-CM | POA: Insufficient documentation

## 2021-08-28 DIAGNOSIS — M629 Disorder of muscle, unspecified: Secondary | ICD-10-CM

## 2021-08-28 DIAGNOSIS — M5382 Other specified dorsopathies, cervical region: Secondary | ICD-10-CM | POA: Insufficient documentation

## 2021-08-28 DIAGNOSIS — G243 Spasmodic torticollis: Secondary | ICD-10-CM

## 2021-08-28 LAB — POSTMENOPAUSAL INTERP: LOW

## 2021-08-28 LAB — OVARIAN MALIGNANCY RISK-ROMA
Cancer Antigen (CA) 125: 7.8 U/mL (ref 0.0–38.1)
HE4: 27.9 pmol/L (ref 0.0–105.2)
Postmenopausal ROMA: 0.42
Premenopausal ROMA: 0.19

## 2021-08-28 LAB — PREMENOPAUSAL INTERP: LOW

## 2021-08-28 NOTE — Progress Notes (Signed)
6 wk GYN u/s f/u for LTO

## 2021-08-28 NOTE — Patient Instructions (Signed)
Please get your x-rays done as soon as possible so that Dr. Dossie Arbour may discuss the results at your next appt.

## 2021-08-28 NOTE — Progress Notes (Signed)
Safety precautions to be maintained throughout the outpatient stay will include: orient to surroundings, keep bed in low position, maintain call bell within reach at all times, provide assistance with transfer out of bed and ambulation.  

## 2021-08-29 ENCOUNTER — Encounter: Payer: Medicare Other | Admitting: Speech Pathology

## 2021-08-29 DIAGNOSIS — M25611 Stiffness of right shoulder, not elsewhere classified: Secondary | ICD-10-CM | POA: Insufficient documentation

## 2021-08-29 DIAGNOSIS — H9202 Otalgia, left ear: Secondary | ICD-10-CM | POA: Insufficient documentation

## 2021-08-29 DIAGNOSIS — M26609 Unspecified temporomandibular joint disorder, unspecified side: Secondary | ICD-10-CM | POA: Insufficient documentation

## 2021-08-29 DIAGNOSIS — Z789 Other specified health status: Secondary | ICD-10-CM | POA: Insufficient documentation

## 2021-08-29 DIAGNOSIS — R937 Abnormal findings on diagnostic imaging of other parts of musculoskeletal system: Secondary | ICD-10-CM | POA: Insufficient documentation

## 2021-08-29 DIAGNOSIS — H53123 Transient visual loss, bilateral: Secondary | ICD-10-CM | POA: Insufficient documentation

## 2021-08-29 DIAGNOSIS — G501 Atypical facial pain: Secondary | ICD-10-CM | POA: Insufficient documentation

## 2021-08-29 DIAGNOSIS — G8929 Other chronic pain: Secondary | ICD-10-CM | POA: Insufficient documentation

## 2021-08-29 DIAGNOSIS — M629 Disorder of muscle, unspecified: Secondary | ICD-10-CM | POA: Insufficient documentation

## 2021-08-29 DIAGNOSIS — M5382 Other specified dorsopathies, cervical region: Secondary | ICD-10-CM | POA: Insufficient documentation

## 2021-08-29 DIAGNOSIS — R519 Headache, unspecified: Secondary | ICD-10-CM | POA: Insufficient documentation

## 2021-08-29 DIAGNOSIS — Z79899 Other long term (current) drug therapy: Secondary | ICD-10-CM | POA: Insufficient documentation

## 2021-08-29 DIAGNOSIS — M542 Cervicalgia: Secondary | ICD-10-CM | POA: Insufficient documentation

## 2021-08-29 DIAGNOSIS — G509 Disorder of trigeminal nerve, unspecified: Secondary | ICD-10-CM | POA: Insufficient documentation

## 2021-08-30 ENCOUNTER — Encounter: Payer: Self-pay | Admitting: Obstetrics and Gynecology

## 2021-08-30 ENCOUNTER — Encounter: Payer: Medicare Other | Admitting: Speech Pathology

## 2021-08-30 LAB — VITAMIN E
Vitamin E (Alpha Tocopherol): 10.3 mg/L (ref 7.0–25.1)
Vitamin E(Gamma Tocopherol): 1.3 mg/L (ref 0.5–5.5)

## 2021-08-30 LAB — VITAMIN A: Vitamin A: 40.8 ug/dL (ref 20.1–62.0)

## 2021-08-30 LAB — VITAMIN K1, SERUM: VITAMIN K1: 1.51 ng/mL (ref 0.10–2.20)

## 2021-09-03 ENCOUNTER — Encounter: Payer: Medicare Other | Admitting: Speech Pathology

## 2021-09-03 ENCOUNTER — Ambulatory Visit: Payer: Medicare Other

## 2021-09-03 DIAGNOSIS — R6884 Jaw pain: Secondary | ICD-10-CM

## 2021-09-03 DIAGNOSIS — M26609 Unspecified temporomandibular joint disorder, unspecified side: Secondary | ICD-10-CM

## 2021-09-03 DIAGNOSIS — M6281 Muscle weakness (generalized): Secondary | ICD-10-CM

## 2021-09-03 LAB — MAGNESIUM: Magnesium: 2.2 mg/dL (ref 1.6–2.3)

## 2021-09-03 LAB — 25-HYDROXY VITAMIN D LCMS D2+D3
25-Hydroxy, Vitamin D-2: 1.5 ng/mL
25-Hydroxy, Vitamin D-3: 37 ng/mL
25-Hydroxy, Vitamin D: 39 ng/mL

## 2021-09-03 NOTE — Patient Instructions (Addendum)
°  Press your tongue to the roof of your mouth comfortably. 5 second holds, 2 sets of 10.   Keep your fingers at the muscles in front of your neck to feel them activate.    If not comfortable, then stop the exercise

## 2021-09-03 NOTE — Therapy (Signed)
[facility] [address], [place], [address] Phone: [phone]   Fax:  [phone]  Physical Therapy Treatment  Patient Details  Name: Autumn Zhang MRN: [id number] Date of Birth: 10-Jul-1971 Referring Provider (PT): Rosalia Hammers, DO   Encounter Date: 09/03/2021   PT End of Session - 09/03/21 1104     Visit Number 2    Number of Visits 9    Date for PT Re-Evaluation 10/24/21    PT Start Time 1104    PT Stop Time 1145    PT Time Calculation (min) 41 min    Activity Tolerance Patient limited by pain;Patient limited by fatigue    Behavior During Therapy [facility] for tasks assessed/performed             Past Medical History:  Diagnosis Date   Allergy    Anemia    Anxiety    Arthritis    Asthma    Attention deficit hyperactivity disorder (ADHD), combined type 10/18/2015   Diagnosed many years ago.  Has been doing well on Ritalin $RemoveBe'5mg'FRcKoAtHt$  QID to six times a day. Long acting hasn't worked well in the past and she has not wanted to try Adderall. She is taking wellbutrin as well, but she has been weaning medication due to SE migraine.  She is now down to Wellbutrin one quarter tablet daily.  Last Assessment & Plan:  A/P: Stable.  Continue current dosing.  Refilled 3 months   Basal cell carcinoma 05/16/2021   Superficial, R ant lower leg. 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[place])    Migraines    Neck pain    Occipital neuralgia    Sleep apnea     Past Surgical History:  Procedure Laterality Date   APPENDECTOMY     removed with colectomy    BREAST SURGERY     reduction   COLON SURGERY  2008   for colon cancer, ~1/3 of colon removed, primary reanastimosis   COLONOSCOPY WITH PROPOFOL N/A 10/07/2018   Procedure: COLONOSCOPY WITH PROPOFOL;  Surgeon: Lin Landsman, MD;  Location: [facility] ENDOSCOPY;  Service: Gastroenterology;  Laterality: N/A;   COLONOSCOPY WITH PROPOFOL N/A 08/14/2021   Procedure: COLONOSCOPY WITH PROPOFOL;  Surgeon: Lin Landsman, MD;  Location: [facility] ENDOSCOPY;  Service: Gastroenterology;  Laterality: N/A;   ENDOMETRIAL ABLATION     trigeminal stimulator      There were no vitals filed for this visit.   Subjective Assessment - 09/03/21 1105     Subjective Jaw is not great. Tried to go for a walk yesterday and felt pain in her jaw. 5/10 L and R jaw. 3 minutes of walking bothers her jaw.    Pertinent History TMJ. Had PT at [facility] which went well. No masks however, and preferes masks due to immune compromise. Tried learning to engage the pterygoid muscle. Has to learn how to use the right muscles due to EDH. 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 L jaw bothers her a lot. R jaw dislocates but  can pop it back in. L jaw pops out but the soft tissue is very loose that even at rest, it is subluxed.    Currently in Pain? No/denies                                        PT Education - 09/03/21 1244     Education Details ther-ex, SI belt    Person(s) Educated Patient    Methods Explanation;Demonstration;Verbal cues;Tactile cues    Comprehension Returned demonstration;Verbalized understanding            Objective    Per pt. Cardiologist and PCP did not giver her any BP or HR range for exercises. Just to go to ER of she has signs and symptoms of a heart attack      PT exercise from previous location: PT does isometric L jaw deviation in netural to work Pterygoid ( R lateral ptergoid muscle.) Also opening and closing jaw (about a little less than 1 cm with  manual assist to prevent lateral deviation and promote training for neutral opening and closing jaw.  Has been wearing her jaw strap since 2019. Can eat with jaw strap very soft solids. Has neck pain, suboccipital muscle pain. (CCI craniocervical instability).    Also does jaw protraction and return to neutral in about less than 1 cm opening.    Does neck exercises but had to stop due to a flair up mid October which made it difficult to activate the proper muscles.    Does isometric neck exercises: reclined position             R and L eye movement             Looking down with eyes and looking up with eyes with neutral head.              Cervical isometrics in neutral (flexion, side bend, chin tucks)             Neurosurgeon told her to limit neck movement.              Wears a cervical collar in the car per surgoen's recommendation.        Email:[email or web address]   Medbridge Access Code [email or web address]   (R jaw deviation movement per pt)    Therapeutic exercises  BP L arm sitting, 123/67, HR 67, mechanicall taken, normal cuff Normal range per pt: 100-110/65-70  L cervical lateral shift posture  Jaw opening and close: R and L lateral movements observed  R jaw deviation when talking with talking without mask and jaw strap   Provided SI belt to support pelvis. Rest of the body feels better per pt.   R cervical lateral shift isometrics in neutral to promote more neutral neck posture. 10x5 seconds   Slight L jaw discomfort  Sitting with pressing tongue at roof of mouth 10x5 seconds   Then with neutral neck position 10x5 seconds for 2 sets, then 4x5 seconds. Fatigue felt afterwards.   Sitting with upright posture and B scapular retraction 10x5 seconds   R shoulder discomfort  Seated chin tucks   Seated glute max squeeze 10x5 seconds to promote pelvic stability without B knee pain (B knee pain in standing)    Improved exercise technique, movement at target joints, use of target  muscles after mod verbal, visual, tactile cues.      Response to  treatment Fair tolerance to today's session   Clinical impression Provided SI belt to promote general improved comfort to hopefully improve comfort level with walking for her jaw. Worked on posture, anterior cervical and scapular muscle strengthening to promote better mechanical position for her jaw. Fair tolerance to today's session. Pt will benefit from skilled physical therapy services to continue to improve jaw strength and neutral jaw movements, as well as cervical and scapular strengthening, and core strength to help decrease jaw instability and dislocations.          PT Short Term Goals - 08/26/21 1325       PT SHORT TERM GOAL #1   Title Patient will be independent with her initial HEP to decrease pain, improve strength, and ability to open and close her mouth more comfortably.    Baseline Pt has started her HEP (08/26/2021)    Time 3    Period Weeks    Status New    Target Date 09/19/21               PT Long Term Goals - 08/26/21 1327       PT LONG TERM GOAL #1   Title Pt will have a decrease in L jaw pain to 3/10 or less at worst to promote ability to open and close her mouth, eat, as well as ambulate more comfortably.    Baseline 9/10 L jaw pain at worst for the past 3 months    Time 8    Period Weeks    Status New    Target Date 10/24/21      PT LONG TERM GOAL #2   Title Pt will have a decrease in R jaw pain to 2/10 or less at worst to promote ability to open and close her mouth, eat, as well as ambulate more comfortably.    Baseline 5/10 R jaw pain at worst for the past 3 months (08/26/2021)    Time 8    Period Weeks    Status New    Target Date 10/24/21      PT LONG TERM GOAL #3   Title Pt will improve her FOTO score by at least 10 points as a demonstration of improved function.    Baseline Orofascial FOTO 40 (pt might have answered questions based on wearing her jaw strap which helps  decrease pain) 08/26/2021.    Time 8    Period Weeks    Status New    Target Date 10/24/21      PT LONG TERM GOAL #4   Title Pt will report decrease in L jaw pain when ambulating to promote ability to walk longer distances.    Baseline Increased L jaw pain with gait, limiting the distance she ambulates (08/26/2021)    Time 8    Period Weeks    Status New    Target Date 10/24/21                   Plan - 09/03/21 1245     Clinical Impression Statement Provided SI belt to promote general improved comfort to hopefully improve comfort level with walking for her jaw. Worked on posture, anterior cervical and scapular muscle strengthening to promote better mechanical position for her jaw. Fair tolerance to today's session. Pt will benefit from skilled physical therapy services to continue to improve jaw strength and neutral jaw movements, as well as cervical and scapular strengthening, and core strength to help decrease jaw instability and dislocations  Personal Factors and Comorbidities Comorbidity 3+;Time since onset of injury/illness/exacerbation;Fitness;Past/Current Experience    Comorbidities Ehlers-Danlos syndrome type III, dysautonomia, occipital neuralgia, basal cell carcinoma, colon CA, arthritis    Examination-Activity Limitations Self Feeding;Locomotion Level    Stability/Clinical Decision Making Stable/Uncomplicated    Clinical Decision Making Low    Rehab Potential Fair    PT Frequency 1x / week    PT Duration 8 weeks    PT Treatment/Interventions Therapeutic activities;Therapeutic exercise;Neuromuscular re-education;Patient/family education;Manual techniques;Dry needling;Biofeedback;Electrical Stimulation;Iontophoresis 4mg /ml Dexamethasone    PT Next Visit Plan posture, core, scapular, cervical, isometric jaw strengthening, manual techniques, modalities PRN    PT Home Exercise Plan Medbridge Access Code [email or web address]    Consulted and Agree with Plan of Care Patient              Patient will benefit from skilled therapeutic intervention in order to improve the following deficits and impairments:  Pain, Improper body mechanics, Postural dysfunction, Decreased strength, Difficulty walking  Visit Diagnosis: Chronic jaw pain  Muscle weakness (generalized)  TMJ (temporomandibular joint syndrome)     Problem List Patient Active Problem List   Diagnosis Date Noted   Atypical facial pain (1ry area of Pain) (Left) 08/29/2021   Chronic ear pain (Left) 08/29/2021   Temporal headache (Left) 08/29/2021   Inner ear pain (Left) 08/29/2021   Trigeminal nerve disorder (Left) 08/29/2021   History of photophobia 08/29/2021   TMJ (temporomandibular joint syndrome) 08/29/2021   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   Polypharmacy 08/29/2021   Severe frontal headaches 08/29/2021   Chronic occipital headache (Midline) (2ry area of Pain) 08/29/2021   Chronic neck pain (posterior) (4th area of Pain) (Bilateral) (L>R) 08/29/2021   Musculoskeletal disorder involving upper trapezius muscle 08/29/2021   Musculoskeletal disorder involving sternocleidomastoid (Left) 08/29/2021   Abnormal MRI, cervical spine (02/11/2019) 08/29/2021   Shoulder stiffness (Right) 08/29/2021   Pharmacologic therapy 08/27/2021   Disorder of skeletal system 08/27/2021   Problems influencing health status 08/27/2021   Anxiety, generalized 07/17/2021   Difficulty walking 07/17/2021   Headache disorder (3ry area of Pain) 07/17/2021   Photophobia 07/17/2021   Speech disturbance 07/17/2021   Tear film insufficiency 07/15/2021   Left lower quadrant abdominal pain 07/05/2021   Palpitations 06/11/2021   Axillary lymphadenopathy 06/11/2021   Immunosuppressed status ([place]) 04/19/2021   History of seronegative inflammatory arthritis 06/28/2020   Osteopenia of multiple sites 06/14/2020   Psoriatic arthritis ([place]) 06/12/2020   Iodine deficiency 06/12/2020   OSA on CPAP 06/12/2020   Chronic  pain syndrome 06/12/2020   Psychophysiological insomnia 11/26/2019   Calcific tendinitis of shoulder (Right) 04/10/2019   History of iron deficiency anemia 11/04/2018   History of basal cell carcinoma 10/07/2018   Vaginal dryness 09/07/2018   Chronic jaw pain (Left) 07/15/2018   Chronic migraine with aura 07/15/2018   Irritable bowel syndrome with diarrhea 07/15/2018   Rectocele 05/21/2018   Ehlers-Danlos syndrome 03/15/2018   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   Instability of shoulder joint (Right) 01/12/2018   Trigeminal neuralgia (V1-3) (Left) 12/25/2017   Instability of shoulder joint (Left) 12/25/2017   Nausea 12/25/2017   Occipital neuralgia 12/25/2017   Dysautonomia ([place]) 12/25/2017   Hiatal hernia 10/14/2017   TMJ (dislocation of temporomandibular joint) 09/29/2017   Pulsatile tinnitus of ear (Left) 12/12/2016   Restless leg syndrome 08/01/2016   Cervical dystonia 03/21/2016   Cervical spine disease 03/21/2016   Cervicalgia 02/06/2016   Paresthesia 02/06/2016   Hypercholesterolemia 12/24/2015  Iron deficiency anemia 10/18/2015   Vitamin D deficiency disease 10/18/2015   Midline low back pain without sciatica 02/06/2015   Hyperglycemia 10/30/2014   History of colon cancer 09/27/2014   Postural orthostatic tachycardia syndrome 09/16/2003   Chronic fatigue 09/15/2000   Asthma 09/15/1988   Dyspnea 09/15/1988    Joneen Boers PT, DPT  09/03/2021, 1:04 PM  [facility] [address], [place], [address] Phone: [phone]   Fax:  [phone]  Name: GRAZIELLA CONNERY MRN: [id number] Date of Birth: 01/30/1971

## 2021-09-05 ENCOUNTER — Encounter: Payer: Medicare Other | Admitting: Speech Pathology

## 2021-09-06 ENCOUNTER — Encounter: Payer: Medicare Other | Admitting: Speech Pathology

## 2021-09-06 LAB — COMPLIANCE DRUG ANALYSIS, UR

## 2021-09-11 ENCOUNTER — Ambulatory Visit: Payer: Medicare Other

## 2021-09-11 DIAGNOSIS — M26609 Unspecified temporomandibular joint disorder, unspecified side: Secondary | ICD-10-CM

## 2021-09-11 DIAGNOSIS — M6281 Muscle weakness (generalized): Secondary | ICD-10-CM

## 2021-09-11 DIAGNOSIS — G8929 Other chronic pain: Secondary | ICD-10-CM

## 2021-09-11 NOTE — Therapy (Signed)
[facility] [address], [place], [address] Phone: [phone]   Fax:  [phone]  Physical Therapy Treatment  Patient Details  Name: Autumn Zhang MRN: [id number] Date of Birth: 1971-02-05 Referring Provider (PT): Rosalia Hammers, DO   Encounter Date: 09/11/2021    PT End of Session - 09/11/21 0933     Visit Number 3    Number of Visits 9    Date for PT Re-Evaluation 10/24/21    PT Start Time 0933    PT Stop Time 6734    PT Time Calculation (min) 41 min    Activity Tolerance Patient limited by pain;Patient limited by fatigue    Behavior During Therapy [facility] for tasks assessed/performed             Past Medical History:  Diagnosis Date   Allergy    Anemia    Anxiety    Arthritis    Asthma    Attention deficit hyperactivity disorder (ADHD), combined type 10/18/2015   Diagnosed many years ago.  Has been doing well on Ritalin $RemoveBe'5mg'[id number]$  QID to six times a day. Long acting hasn't worked well in the past and she has not wanted to try Adderall. She is taking wellbutrin as well, but she has been weaning medication due to SE migraine.  She is now down to Wellbutrin one quarter tablet daily.  Last Assessment & Plan:  A/P: Stable.  Continue current dosing.  Refilled 3 months   Basal cell carcinoma 05/16/2021   Superficial, R ant lower leg. 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[place])    Migraines    Neck pain    Occipital neuralgia    Sleep apnea     Past Surgical History:  Procedure Laterality Date   APPENDECTOMY     removed with colectomy    BREAST SURGERY     reduction   COLON SURGERY  2008   for colon cancer, ~1/3 of colon removed, primary reanastimosis   COLONOSCOPY WITH PROPOFOL N/A 10/07/2018   Procedure: COLONOSCOPY WITH PROPOFOL;  Surgeon: Lin Landsman, MD;  Location: [facility] ENDOSCOPY;  Service: Gastroenterology;  Laterality: N/A;   COLONOSCOPY WITH PROPOFOL N/A 08/14/2021   Procedure: COLONOSCOPY WITH PROPOFOL;  Surgeon: Lin Landsman, MD;  Location: [facility] ENDOSCOPY;  Service: Gastroenterology;  Laterality: N/A;   ENDOMETRIAL ABLATION     trigeminal stimulator      There were no vitals filed for this visit.   Subjective Assessment - 09/11/21 0935     Subjective Jaw has been ok. Struggled last week after last session at jaw and suboccipital area. 1-2/10 R jaw, 4/10 L.  The SI belt is nice compared to not having one. Also does about 1.5 hours of PT exercises for her body at home.    Pertinent History TMJ. Had PT at [facility] which went well. No masks however, and preferes masks due to immune compromise. Tried learning to engage the pterygoid muscle. Has to learn how to use the right muscles due to EDH. 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 L jaw bothers her a lot. R jaw dislocates but can pop it back in. L jaw pops out but the soft tissue is very loose that even at rest, it is subluxed.    Currently in Pain? Yes    Pain Score 4     Pain Location Jaw    Pain Orientation Left                                        PT Education - 09/11/21 1422     Education Details ther-ex, HEP    Person(s) Educated Patient    Methods Explanation;Demonstration;Tactile cues;Verbal cues    Comprehension Returned demonstration;Verbalized understanding            Objective    Per pt. Cardiologist and PCP did not giver her any BP or HR range for exercises. Just to go to ER of she has signs and symptoms of a heart attack       PT exercise from previous location: PT does isometric  L jaw deviation in netural to work Pterygoid ( R lateral ptergoid muscle.) Also opening and closing jaw (about a little less than 1 cm with manual assist to prevent lateral deviation and promote training for neutral opening and closing jaw.  Has been wearing her jaw strap since 2019. Can eat with jaw strap very soft solids. Has neck pain, suboccipital muscle pain. (CCI craniocervical instability).    Also does jaw protraction and return to neutral in about less than 1 cm opening.    Does neck exercises but had to stop due to a flair up mid October which made it difficult to activate the proper muscles.    Does isometric neck exercises: reclined position             R and L eye movement             Looking down with eyes and looking up with eyes with neutral head.              Cervical isometrics in neutral (flexion, side bend, chin tucks)             Neurosurgeon told her to limit neck movement.              Wears a cervical collar in the car per surgoen's recommendation.        Email:[email or web address]   Medbridge Access Code [email or web address]   (R jaw deviation movement per pt)      Lights off secondary to light sensitivity.   Therapeutic exercises  BP L arm sitting, 115/69, HR 72, mechanicall taken, normal cuff Normal range per pt: 100-110/65-70   Gentle eccentric jaw closing   Discomfort  Gentle jaw clenching 10x5 seconds  Then with tongue depressor isometric resistance 10x5 seconds to L back teeth activate temporalis and masseter muscles to keep jaw in place as pt opens mouth.  Feels like its doing something good per pt.  Pt might benefit form increased activation/control of her temporalis and masseter muscles L side > R to possibly prevent subluxation with jaw opening and closing   Reclined position  Pressing toungue at roof of mouth 10x5 seconds   Cervical nodding 10x  Slight discomfort unless pt stabilizes L side of jaw with hand    Chin tucks 10x   Feels like not much  room for L  jaw    Improved exercise technique, movement at target joints, use of target muscles after mod verbal, visual, tactile cues.        Response to treatment Pt tolerated session well without aggravation of symptoms.      Clinical impression Pt might benefit form increased activation/control of her temporalis and masseter muscles L side > R to possibly prevent subluxation with jaw opening and closing. Possible decrease in symptoms with pt gently biting into tip of tongue depressor with L back teeth to promote stability. Pt tolerated session well without aggravation of symptoms. Pt will benefit from skilled physical therapy services to continue to improve jaw strength and neutral jaw movements, as well as cervical and scapular strengthening, and core strength to help decrease jaw instability and dislocations.           PT Short Term Goals - 08/26/21 1325       PT SHORT TERM GOAL #1   Title Patient will be independent with her initial HEP to decrease pain, improve strength, and ability to open and close her mouth more comfortably.    Baseline Pt has started her HEP (08/26/2021)    Time 3    Period Weeks    Status New    Target Date 09/19/21               PT Long Term Goals - 08/26/21 1327       PT LONG TERM GOAL #1   Title Pt will have a decrease in L jaw pain to 3/10 or less at worst to promote ability to open and close her mouth, eat, as well as ambulate more comfortably.    Baseline 9/10 L jaw pain at worst for the past 3 months    Time 8    Period Weeks    Status New    Target Date 10/24/21      PT LONG TERM GOAL #2   Title Pt will have a decrease in R jaw pain to 2/10 or less at worst to promote ability to open and close her mouth, eat, as well as ambulate more comfortably.    Baseline 5/10 R jaw pain at worst for the past 3 months (08/26/2021)    Time 8    Period Weeks    Status New    Target Date 10/24/21      PT LONG TERM GOAL #3   Title Pt will  improve her FOTO score by at least 10 points as a demonstration of improved function.    Baseline Orofascial FOTO 40 (pt might have answered questions based on wearing her jaw strap which helps decrease pain) 08/26/2021.    Time 8    Period Weeks    Status New    Target Date 10/24/21      PT LONG TERM GOAL #4   Title Pt will report decrease in L jaw pain when ambulating to promote ability to walk longer distances.    Baseline Increased L jaw pain with gait, limiting the distance she ambulates (08/26/2021)    Time 8    Period Weeks    Status New    Target Date 10/24/21                   Plan - 09/11/21 1425     Clinical Impression Statement Pt might benefit form increased activation/control of her temporalis and masseter muscles L side > R to possibly prevent subluxation with jaw opening and closing. Possible decrease  in symptoms with pt gently biting into tip of tongue depressor with L back teeth to promote stability. Pt tolerated session well without aggravation of symptoms. Pt will benefit from skilled physical therapy services to continue to improve jaw strength and neutral jaw movements, as well as cervical and scapular strengthening, and core strength to help decrease jaw instability and dislocations.    Personal Factors and Comorbidities Comorbidity 3+;Time since onset of injury/illness/exacerbation;Fitness;Past/Current Experience    Comorbidities Ehlers-Danlos syndrome type III, dysautonomia, occipital neuralgia, basal cell carcinoma, colon CA, arthritis    Examination-Activity Limitations Self Feeding;Locomotion Level    Stability/Clinical Decision Making Stable/Uncomplicated    Rehab Potential Fair    PT Frequency 1x / week    PT Duration 8 weeks    PT Treatment/Interventions Therapeutic activities;Therapeutic exercise;Neuromuscular re-education;Patient/family education;Manual techniques;Dry needling;Biofeedback;Electrical Stimulation;Iontophoresis 4mg /ml Dexamethasone     PT Next Visit Plan posture, core, scapular, cervical, isometric jaw strengthening, manual techniques, modalities PRN    PT Home Exercise Plan Medbridge Access Code [email or web address]    Consulted and Agree with Plan of Care Patient             Patient will benefit from skilled therapeutic intervention in order to improve the following deficits and impairments:  Pain, Improper body mechanics, Postural dysfunction, Decreased strength, Difficulty walking  Visit Diagnosis: Chronic jaw pain  Muscle weakness (generalized)  TMJ (temporomandibular joint syndrome)     Problem List Patient Active Problem List   Diagnosis Date Noted   Atypical facial pain (1ry area of Pain) (Left) 08/29/2021   Chronic ear pain (Left) 08/29/2021   Temporal headache (Left) 08/29/2021   Inner ear pain (Left) 08/29/2021   Trigeminal nerve disorder (Left) 08/29/2021   History of photophobia 08/29/2021   TMJ (temporomandibular joint syndrome) 08/29/2021   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   Polypharmacy 08/29/2021   Severe frontal headaches 08/29/2021   Chronic occipital headache (Midline) (2ry area of Pain) 08/29/2021   Chronic neck pain (posterior) (4th area of Pain) (Bilateral) (L>R) 08/29/2021   Musculoskeletal disorder involving upper trapezius muscle 08/29/2021   Musculoskeletal disorder involving sternocleidomastoid (Left) 08/29/2021   Abnormal MRI, cervical spine (02/11/2019) 08/29/2021   Shoulder stiffness (Right) 08/29/2021   Pharmacologic therapy 08/27/2021   Disorder of skeletal system 08/27/2021   Problems influencing health status 08/27/2021   Anxiety, generalized 07/17/2021   Difficulty walking 07/17/2021   Headache disorder (3ry area of Pain) 07/17/2021   Photophobia 07/17/2021   Speech disturbance 07/17/2021   Tear film insufficiency 07/15/2021   Left lower quadrant abdominal pain 07/05/2021   Palpitations 06/11/2021   Axillary lymphadenopathy 06/11/2021   Immunosuppressed  status ([place]) 04/19/2021   History of seronegative inflammatory arthritis 06/28/2020   Osteopenia of multiple sites 06/14/2020   Psoriatic arthritis ([place]) 06/12/2020   Iodine deficiency 06/12/2020   OSA on CPAP 06/12/2020   Chronic pain syndrome 06/12/2020   Psychophysiological insomnia 11/26/2019   Calcific tendinitis of shoulder (Right) 04/10/2019   History of iron deficiency anemia 11/04/2018   History of basal cell carcinoma 10/07/2018   Vaginal dryness 09/07/2018   Chronic jaw pain (Left) 07/15/2018   Chronic migraine with aura 07/15/2018   Irritable bowel syndrome with diarrhea 07/15/2018   Rectocele 05/21/2018   Ehlers-Danlos syndrome 03/15/2018   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   Instability of shoulder joint (Right) 01/12/2018   Trigeminal neuralgia (V1-3) (Left) 12/25/2017   Instability of shoulder joint (Left) 12/25/2017   Nausea 12/25/2017   Occipital neuralgia  12/25/2017   Dysautonomia ([place]) 12/25/2017   Hiatal hernia 10/14/2017   TMJ (dislocation of temporomandibular joint) 09/29/2017   Pulsatile tinnitus of ear (Left) 12/12/2016   Restless leg syndrome 08/01/2016   Cervical dystonia 03/21/2016   Cervical spine disease 03/21/2016   Cervicalgia 02/06/2016   Paresthesia 02/06/2016   Hypercholesterolemia 12/24/2015   Iron deficiency anemia 10/18/2015   Vitamin D deficiency disease 10/18/2015   Midline low back pain without sciatica 02/06/2015   Hyperglycemia 10/30/2014   History of colon cancer 09/27/2014   Postural orthostatic tachycardia syndrome 09/16/2003   Chronic fatigue 09/15/2000   Asthma 09/15/1988   Dyspnea 09/15/1988   Joneen Boers PT, DPT   09/11/2021, 2:30 PM  [facility] [address], [place], [address] Phone: [phone]   Fax:  [phone]  Name: CELISSE CIULLA MRN: [id number] Date of Birth: 14-Jul-1971

## 2021-09-11 NOTE — Patient Instructions (Signed)
Gentle jaw clenching with toungue depressor isometric resistance 10x5 seconds to L back teeth activate temporalis and masseter muscles to keep jaw in place as pt opens mouth.  Feels like its doing something good per pt. Reviewed and given as part of her HEP. Pt demonstrated and verbalized understanding.

## 2021-09-17 ENCOUNTER — Ambulatory Visit: Payer: Medicare Other | Attending: Family Medicine

## 2021-09-17 DIAGNOSIS — M26609 Unspecified temporomandibular joint disorder, unspecified side: Secondary | ICD-10-CM | POA: Insufficient documentation

## 2021-09-17 DIAGNOSIS — R6884 Jaw pain: Secondary | ICD-10-CM | POA: Diagnosis present

## 2021-09-17 DIAGNOSIS — G8929 Other chronic pain: Secondary | ICD-10-CM | POA: Insufficient documentation

## 2021-09-17 DIAGNOSIS — M6281 Muscle weakness (generalized): Secondary | ICD-10-CM | POA: Diagnosis present

## 2021-09-17 NOTE — Therapy (Signed)
[facility] [address] [place], [address] Phone: [phone]   Fax:  [phone]  Physical Therapy Treatment  Patient Details  Name: Autumn Zhang MRN: [id number] Date of Birth: Feb 10, 1971 Referring Provider (PT): Rosalia Hammers, DO   Encounter Date: 09/17/2021   PT End of Session - 09/17/21 1100     Visit Number 4    Number of Visits 9    Date for PT Re-Evaluation 10/24/21    PT Start Time 1101    PT Stop Time 8502    PT Time Calculation (min) 37 min    Activity Tolerance Patient limited by pain;Patient limited by fatigue    Behavior During Therapy [facility] for tasks assessed/performed             Past Medical History:  Diagnosis Date   Allergy    Anemia    Anxiety    Arthritis    Asthma    Attention deficit hyperactivity disorder (ADHD), combined type 10/18/2015   Diagnosed many years ago.  Has been doing well on Ritalin 7m QID to six times a day. Long acting hasn't worked well in the past and she has not wanted to try Adderall. She is taking wellbutrin as well, but she has been weaning medication due to SE migraine.  She is now down to Wellbutrin one quarter tablet daily.  Last Assessment & Plan:  A/P: Stable.  Continue current dosing.  Refilled 3 months   Basal cell carcinoma 05/16/2021   Superficial, R [place]. 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[place]    Migraines    Neck pain    Occipital neuralgia    Sleep apnea     Past Surgical History:  Procedure Laterality Date   APPENDECTOMY     removed with colectomy    BREAST SURGERY     reduction   COLON SURGERY  2008   for colon cancer, ~1/3 of colon removed, primary reanastimosis   COLONOSCOPY WITH PROPOFOL N/A 10/07/2018   Procedure: COLONOSCOPY WITH PROPOFOL;  Surgeon: VLin Landsman MD;  Location: [facility] ENDOSCOPY;  Service: Gastroenterology;  Laterality: N/A;   COLONOSCOPY WITH PROPOFOL N/A 08/14/2021   Procedure: COLONOSCOPY WITH PROPOFOL;  Surgeon: VLin Landsman MD;  [facility];  Service: Gastroenterology;  Laterality: N/A;   ENDOMETRIAL ABLATION     trigeminal stimulator      There were no vitals filed for this visit.   Subjective Assessment - 09/17/21 1103     Subjective The pressing her tungue at the roof of her mouth as well as the toungue depressor helps. Able to walk up to 19 minutes this week. Does better with exercises in small increments.    Pertinent History TMJ. Had PT at [facility] which went well. No masks however, and preferes masks due to immune compromise. Tried learning to engage the pterygoid muscle. Has to learn how to use the right muscles due to EDH. 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 L jaw bothers her a lot.  R jaw dislocates but can pop it back in. L jaw pops out but the soft tissue is very loose that even at rest, it is subluxed.    Currently in Pain? Yes    Pain Score 6                                         PT Education - 09/17/21 1319     Education Details ther-ex    Person(s) Educated Patient    Methods Explanation;Demonstration;Tactile cues;Verbal cues    Comprehension Returned demonstration;Verbalized understanding           Objective    Per pt. Cardiologist and PCP did not giver her any BP or HR range for exercises. Just to go to ER of she has signs and symptoms of a heart attack       PT exercise from previous location: PT does isometric L jaw deviation in netural to work Pterygoid ( R lateral ptergoid muscle.) Also opening and closing  jaw (about a little less than 1 cm with manual assist to prevent lateral deviation and promote training for neutral opening and closing jaw.  Has been wearing her jaw strap since 2019. Can eat with jaw strap very soft solids. Has neck pain, suboccipital muscle pain. (CCI craniocervical instability).    Also does jaw protraction and return to neutral in about less than 1 cm opening.    Does neck exercises but had to stop due to a flair up mid October which made it difficult to activate the proper muscles.    Does isometric neck exercises: reclined position             R and L eye movement             Looking down with eyes and looking up with eyes with neutral head.              Cervical isometrics in neutral (flexion, side bend, chin tucks)             Neurosurgeon told her to limit neck movement.              Wears a cervical collar in the car per surgoen's recommendation.        [email or web address]   Medbridge Access Code [email or web address]   (R jaw deviation movement per pt)       Lights off secondary to light sensitivity.    Therapeutic exercises  Worked on jaw support with pt using Coban strap  Added chin strap. Feels better per pt.   BP L arm sitting, 115/69, HR 72, mechanicall taken, normal cuff Normal range per pt: 100-110/65-70   Reclined:  Transversus abdominis contraction 1.5 minutes, than 1 minute    Pressing tongue at roof of mouth after STM to decrease R masseter muscle tension   Improved exercise technique, movement at target joints, use of target muscles after mod verbal, visual, tactile cues.     Manual therapy Reclined position  STM R jaw muscles to decrease tightness per pt request   R masseter   Feels better per pt.     Response to treatment Pt tolerated session well without aggravation of symptoms.  Decreased L jaw pain to 5/10 after session.      Clinical impression Worked on improving trunk and pelvic stabilizing muscles to promote a good  foundation for low back muscles for neck  muscles and jaw muscles to function more effectively. Light session secondary to gradually building up pt strength from the ground up (foundation: low back and pelvis for better support for her neck and jaw muscles due to EDH).  Decreased L jaw pain reported after session. Pt will benefit from continued skilled physical therapy services to continue to improve jaw strength and neutral jaw movements, as well as cervical and scapular strengthening, and core strength to help decrease jaw instability and dislocations.               PT Short Term Goals - 08/26/21 1325       PT SHORT TERM GOAL #1   Title Patient will be independent with her initial HEP to decrease pain, improve strength, and ability to open and close her mouth more comfortably.    Baseline Pt has started her HEP (08/26/2021)    Time 3    Period Weeks    Status New    Target Date 09/19/21               PT Long Term Goals - 08/26/21 1327       PT LONG TERM GOAL #1   Title Pt will have a decrease in L jaw pain to 3/10 or less at worst to promote ability to open and close her mouth, eat, as well as ambulate more comfortably.    Baseline 9/10 L jaw pain at worst for the past 3 months    Time 8    Period Weeks    Status New    Target Date 10/24/21      PT LONG TERM GOAL #2   Title Pt will have a decrease in R jaw pain to 2/10 or less at worst to promote ability to open and close her mouth, eat, as well as ambulate more comfortably.    Baseline 5/10 R jaw pain at worst for the past 3 months (08/26/2021)    Time 8    Period Weeks    Status New    Target Date 10/24/21      PT LONG TERM GOAL #3   Title Pt will improve her FOTO score by at least 10 points as a demonstration of improved function.    Baseline Orofascial FOTO 40 (pt might have answered questions based on wearing her jaw strap which helps decrease pain) 08/26/2021.    Time 8    Period Weeks    Status New     Target Date 10/24/21      PT LONG TERM GOAL #4   Title Pt will report decrease in L jaw pain when ambulating to promote ability to walk longer distances.    Baseline Increased L jaw pain with gait, limiting the distance she ambulates (08/26/2021)    Time 8    Period Weeks    Status New    Target Date 10/24/21                   Plan - 09/17/21 1320     Clinical Impression Statement Worked on improving trunk and pelvic stabilizing muscles to promote a good foundation for low back muscles for neck muscles and jaw muscles to function more effectively. Light session secondary to gradually building up pt strength from the ground up (foundation: low back and pelvis for better support for her neck and jaw muscles due to EDH).  Decreased L jaw pain reported after session. Pt will benefit from continued skilled physical therapy services to  continue to improve jaw strength and neutral jaw movements, as well as cervical and scapular strengthening, and core strength to help decrease jaw instability and dislocations.    Personal Factors and Comorbidities Comorbidity 3+;Time since onset of injury/illness/exacerbation;Fitness;Past/Current Experience    Comorbidities Ehlers-Danlos syndrome type III, dysautonomia, occipital neuralgia, basal cell carcinoma, colon CA, arthritis    Examination-Activity Limitations Self Feeding;Locomotion Level    Stability/Clinical Decision Making Stable/Uncomplicated    Clinical Decision Making Low    Rehab Potential Fair    PT Frequency 1x / week    PT Duration 8 weeks    PT Treatment/Interventions Therapeutic activities;Therapeutic exercise;Neuromuscular re-education;Patient/family education;Manual techniques;Dry needling;Biofeedback;Electrical Stimulation;Iontophoresis 16m/ml Dexamethasone    PT Next Visit Plan posture, core, scapular, cervical, isometric jaw strengthening, manual techniques, modalities PRN    PT Home Exercise Plan Medbridge Access Code [email or web address]    Consulted and Agree with Plan of Care Patient             Patient will benefit from skilled therapeutic intervention in order to improve the following deficits and impairments:  Pain, Improper body mechanics, Postural dysfunction, Decreased strength, Difficulty walking  Visit Diagnosis: Chronic jaw pain  Muscle weakness (generalized)  TMJ (temporomandibular joint syndrome)     Problem List Patient Active Problem List   Diagnosis Date Noted   Atypical facial pain (1ry area of Pain) (Left) 08/29/2021   Chronic ear pain (Left) 08/29/2021   Temporal headache (Left) 08/29/2021   Inner ear pain (Left) 08/29/2021   Trigeminal nerve disorder (Left) 08/29/2021   History of photophobia 08/29/2021   TMJ (temporomandibular joint syndrome) 08/29/2021   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   Polypharmacy 08/29/2021   Severe frontal headaches 08/29/2021   Chronic occipital headache (Midline) (2ry area of Pain) 08/29/2021   Chronic neck pain (posterior) (4th area of Pain) (Bilateral) (L>R) 08/29/2021   Musculoskeletal disorder involving upper trapezius muscle 08/29/2021   Musculoskeletal disorder involving sternocleidomastoid (Left) 08/29/2021   Abnormal MRI, cervical spine (02/11/2019) 08/29/2021   Shoulder stiffness (Right) 08/29/2021   Pharmacologic therapy 08/27/2021   Disorder of skeletal system 08/27/2021   Problems influencing health status 08/27/2021   Anxiety, generalized 07/17/2021   Difficulty walking 07/17/2021   Headache disorder (3ry area of Pain) 07/17/2021   Photophobia 07/17/2021   Speech disturbance 07/17/2021   Tear film insufficiency 07/15/2021   Left lower quadrant abdominal pain 07/05/2021   Palpitations 06/11/2021   Axillary lymphadenopathy 06/11/2021   [place]osuppressed status ([place] 04/19/2021   History of seronegative inflammatory arthritis 06/28/2020   Osteopenia of multiple sites 06/14/2020   Psoriatic arthritis ([place] 06/12/2020   Iodine  deficiency 06/12/2020   OSA on CPAP 06/12/2020   Chronic pain syndrome 06/12/2020   Psychophysiological insomnia 11/26/2019   Calcific tendinitis of shoulder (Right) 04/10/2019   History of iron deficiency anemia 11/04/2018   History of basal cell carcinoma 10/07/2018   Vaginal dryness 09/07/2018   Chronic jaw pain (Left) 07/15/2018   Chronic migraine with aura 07/15/2018   Irritable bowel syndrome with diarrhea 07/15/2018   Rectocele 05/21/2018   Ehlers-Danlos syndrome 03/15/2018   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   Instability of shoulder joint (Right) 01/12/2018   Trigeminal neuralgia (V1-3) (Left) 12/25/2017   Instability of shoulder joint (Left) 12/25/2017   Nausea 12/25/2017   Occipital neuralgia 12/25/2017   Dysautonomia ([place] 12/25/2017   Hiatal hernia 10/14/2017   TMJ (dislocation of temporomandibular joint) 09/29/2017   Pulsatile tinnitus of ear (Left) 12/12/2016  Restless leg syndrome 08/01/2016   Cervical dystonia 03/21/2016   Cervical spine disease 03/21/2016   Cervicalgia 02/06/2016   Paresthesia 02/06/2016   Hypercholesterolemia 12/24/2015   Iron deficiency anemia 10/18/2015   Vitamin D deficiency disease 10/18/2015   Midline low back pain without sciatica 02/06/2015   Hyperglycemia 10/30/2014   History of colon cancer 09/27/2014   Postural orthostatic tachycardia syndrome 09/16/2003   Chronic fatigue 09/15/2000   Asthma 09/15/1988   Dyspnea 09/15/1988     Joneen Boers PT, DPT  09/17/2021, 1:26 PM  [facility] [address], [place], [address] Phone: [phone]   Fax:  [phone]  Name: Autumn Zhang MRN: [id number] Date of Birth: May 07, 1971

## 2021-09-18 ENCOUNTER — Other Ambulatory Visit: Payer: Self-pay | Admitting: *Deleted

## 2021-09-18 DIAGNOSIS — Z862 Personal history of diseases of the blood and blood-forming organs and certain disorders involving the immune mechanism: Secondary | ICD-10-CM

## 2021-09-18 DIAGNOSIS — D509 Iron deficiency anemia, unspecified: Secondary | ICD-10-CM

## 2021-09-23 ENCOUNTER — Ambulatory Visit: Payer: No Typology Code available for payment source | Admitting: Pain Medicine

## 2021-09-24 ENCOUNTER — Ambulatory Visit: Payer: Medicare Other

## 2021-09-24 DIAGNOSIS — G8929 Other chronic pain: Secondary | ICD-10-CM

## 2021-09-24 DIAGNOSIS — R6884 Jaw pain: Secondary | ICD-10-CM

## 2021-09-24 DIAGNOSIS — M6281 Muscle weakness (generalized): Secondary | ICD-10-CM

## 2021-09-24 NOTE — Therapy (Signed)
[facility] [address], [place], [address] Phone: [phone]   Fax:  [phone]  Physical Therapy Treatment  Patient Details  Name: Autumn Zhang MRN: [id number] Date of Birth: [birth year] Referring Provider (PT): Rosalia Hammers, DO   Encounter Date: 09/24/2021   PT End of Session - 09/24/21 1104     Visit Number 5    Number of Visits 9    Date for PT Re-Evaluation 10/24/21    PT Start Time 1104    PT Stop Time 1150    PT Time Calculation (min) 46 min    Activity Tolerance Patient limited by pain;Patient limited by fatigue    Behavior During Therapy [facility] for tasks assessed/performed             Past Medical History:  Diagnosis Date   Allergy    Anemia    Anxiety    Arthritis    Asthma    Attention deficit hyperactivity disorder (ADHD), combined type 10/18/2015   Diagnosed many years ago.  Has been doing well on Ritalin $RemoveBe'5mg'yAZvWinSE$  QID to six times a day. Long acting hasn't worked well in the past and she has not wanted to try Adderall. She is taking wellbutrin as well, but she has been weaning medication due to SE migraine.  She is now down to Wellbutrin one quarter tablet daily.  Last Assessment & Plan:  A/P: Stable.  Continue current dosing.  Refilled 3 months   Basal cell carcinoma 05/16/2021   Superficial, R ant lower leg. 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[place])    Migraines    Neck pain    Occipital neuralgia    Sleep apnea     Past Surgical History:  Procedure Laterality Date   APPENDECTOMY     removed with colectomy    BREAST SURGERY     reduction   COLON SURGERY  2008   for colon cancer, ~1/3 of colon removed, primary reanastimosis   COLONOSCOPY WITH PROPOFOL N/A 10/07/2018   Procedure: COLONOSCOPY WITH PROPOFOL;  Surgeon: Lin Landsman, MD;  Location: [facility] ENDOSCOPY;  Service: Gastroenterology;  Laterality: N/A;   COLONOSCOPY WITH PROPOFOL N/A 08/14/2021   Procedure: COLONOSCOPY WITH PROPOFOL;  Surgeon: Lin Landsman, MD;  Location: [facility] ENDOSCOPY;  Service: Gastroenterology;  Laterality: N/A;   ENDOMETRIAL ABLATION     trigeminal stimulator      There were no vitals filed for this visit.   Subjective Assessment - 09/24/21 1105     Subjective Doing ok. Body is reminding her to not do too much, too fast, or too hard. L jaw pain is ok. 2 sets of the tongue depressor exercise was a lot, cut down to 1 set with lighter resistance which is better. Feels like her jaw is getting more unstable. Has to hold her jaw more and more as she is chewing. Not sure what is going on. The new jaw strap has enabled her to walk further. 20 minutes is her max at this point. Feels like PT is going  fast. Wants to make sure she knows how to do what she has. Wants to go over her overall home PT exercise routine (which involves her previous PT treatments) at home because she is doing about 1.5 hours a day for next visit.    Pertinent History TMJ. Had PT at [facility] which went well. No masks however, and preferes masks due to immune compromise. Tried learning to engage the pterygoid muscle. Has to learn how to use the right muscles due to EDH. 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 L jaw bothers her a lot. R jaw dislocates but can pop it back in. L jaw pops out but the soft tissue is very loose that even at rest, it is subluxed.    Currently in Pain? Yes    Pain Score 5                                         PT Education - 09/24/21 1159     Education Details ther-ex    Person(s)  Educated Patient    Methods Explanation;Demonstration;Tactile cues;Verbal cues    Comprehension Returned demonstration;Verbalized understanding            Objective    Per pt. Cardiologist and PCP did not giver her any BP or HR range for exercises. Just to go to ER of she has signs and symptoms of a heart attack       PT exercise from previous location: PT does isometric L jaw deviation in netural to work Pterygoid ( R lateral ptergoid muscle.) Also opening and closing jaw (about a little less than 1 cm with manual assist to prevent lateral deviation and promote training for neutral opening and closing jaw.  Has been wearing her jaw strap since 2019. Can eat with jaw strap very soft solids. Has neck pain, suboccipital muscle pain. (CCI craniocervical instability).    Also does jaw protraction and return to neutral in about less than 1 cm opening.    Does neck exercises but had to stop due to a flair up mid October which made it difficult to activate the proper muscles.    Does isometric neck exercises: reclined position             R and L eye movement             Looking down with eyes and looking up with eyes with neutral head.              Cervical isometrics in neutral (flexion, side bend, chin tucks)             Neurosurgeon told her to limit neck movement.              Wears a cervical collar in the car per surgoen's recommendation.        Email:[email or web address]   Medbridge Access Code [email or web address]   (R jaw deviation movement per pt)       Lights off secondary to light sensitivity.    Therapeutic exercises  Seated chin tucks 10x5 seconds with jaw supported.   Standing glute max squeeze 10x  Seated glute max squeeze. Difficulty  Seated pelvic floor contraction 10x2  Seated hip extension isometrics gentle   R 10x2  L 10x2  Seated gentle hip abduction isometrics 4x  Posterior hip discomfort.   Reclined transversus abdominis contraction 19 seconds, 13 seconds,  7 seconds.   Reclined SAQ  R 10x3. Decreased R ASIS discomfort  Reclined glute max squeeze 10x2     Improved exercise technique, movement at target joints, use of target muscles after mod to max verbal, visual, tactile cues.     Response to treatment/Clinical impression Fair tolerance to today's session. Initial decrease in R anterior hip discomfort targeting the rectus femoris muscle. Difficulty tolerating too much exercises based on EDS. Increased cues needed to properly activate target muscles such as the glute max. Worked on improving trunk and pelvic stabilizing muscles to promote a good foundation for low back muscles for neck muscles and jaw muscles to function more effectively. Pt will benefit from continued skilled physical therapy services to continue to improve jaw strength and neutral jaw movements, as well as cervical and scapular strengthening, and core strength to help decrease jaw instability and dislocations.               PT Short Term Goals - 08/26/21 1325       PT SHORT TERM GOAL #1   Title Patient will be independent with her initial HEP to decrease pain, improve strength, and ability to open and close her mouth more comfortably.    Baseline Pt has started her HEP (08/26/2021)    Time 3    Period Weeks    Status New    Target Date 09/19/21               PT Long Term Goals - 08/26/21 1327       PT LONG TERM GOAL #1   Title Pt will have a decrease in L jaw pain to 3/10 or less at worst to promote ability to open and close her mouth, eat, as well as ambulate more comfortably.    Baseline 9/10 L jaw pain at worst for the past 3 months    Time 8    Period Weeks    Status New    Target Date 10/24/21      PT LONG TERM GOAL #2   Title Pt will have a decrease in R jaw pain to 2/10 or less at worst to promote ability to open and close her mouth, eat, as well as ambulate more comfortably.    Baseline 5/10 R jaw pain at worst for the past 3 months  (08/26/2021)    Time 8    Period Weeks    Status New    Target Date 10/24/21      PT LONG TERM GOAL #3   Title Pt will improve her FOTO score by at least 10 points as a demonstration of improved function.    Baseline Orofascial FOTO 40 (pt might have answered questions based on wearing her jaw strap which helps decrease pain) 08/26/2021.    Time 8    Period Weeks    Status New    Target Date 10/24/21      PT LONG TERM GOAL #4   Title Pt will report decrease in L jaw pain when ambulating to promote ability to walk longer distances.    Baseline Increased L jaw pain with gait, limiting the distance she ambulates (08/26/2021)    Time 8    Period Weeks    Status New    Target Date 10/24/21                   Plan - 09/24/21 1156     Clinical Impression Statement Fair tolerance to today's session. Initial  decrease in R anterior hip discomfort targeting the rectus femoris muscle. Difficulty tolerating too much exercises based on EDS. Increased cues needed to properly activate target muscles such as the glute max. Worked on improving trunk and pelvic stabilizing muscles to promote a good foundation for low back muscles for neck muscles and jaw muscles to function more effectively. Pt will benefit from continued skilled physical therapy services to continue to improve jaw strength and neutral jaw movements, as well as cervical and scapular strengthening, and core strength to help decrease jaw instability and dislocations.    Personal Factors and Comorbidities Comorbidity 3+;Time since onset of injury/illness/exacerbation;Fitness;Past/Current Experience    Comorbidities Ehlers-Danlos syndrome type III, dysautonomia, occipital neuralgia, basal cell carcinoma, colon CA, arthritis    Examination-Activity Limitations Self Feeding;Locomotion Level    Stability/Clinical Decision Making Stable/Uncomplicated    Rehab Potential Fair    PT Frequency 1x / week    PT Duration 8 weeks    PT  Treatment/Interventions Therapeutic activities;Therapeutic exercise;Neuromuscular re-education;Patient/family education;Manual techniques;Dry needling;Biofeedback;Electrical Stimulation;Iontophoresis 4mg /ml Dexamethasone    PT Next Visit Plan posture, core, scapular, cervical, isometric jaw strengthening, manual techniques, modalities PRN    PT Home Exercise Plan Medbridge Access Code [email or web address]    Consulted and Agree with Plan of Care Patient             Patient will benefit from skilled therapeutic intervention in order to improve the following deficits and impairments:  Pain, Improper body mechanics, Postural dysfunction, Decreased strength, Difficulty walking  Visit Diagnosis: Chronic jaw pain  Muscle weakness (generalized)     Problem List Patient Active Problem List   Diagnosis Date Noted   Atypical facial pain (1ry area of Pain) (Left) 08/29/2021   Chronic ear pain (Left) 08/29/2021   Temporal headache (Left) 08/29/2021   Inner ear pain (Left) 08/29/2021   Trigeminal nerve disorder (Left) 08/29/2021   History of photophobia 08/29/2021   TMJ (temporomandibular joint syndrome) 08/29/2021   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   Polypharmacy 08/29/2021   Severe frontal headaches 08/29/2021   Chronic occipital headache (Midline) (2ry area of Pain) 08/29/2021   Chronic neck pain (posterior) (4th area of Pain) (Bilateral) (L>R) 08/29/2021   Musculoskeletal disorder involving upper trapezius muscle 08/29/2021   Musculoskeletal disorder involving sternocleidomastoid (Left) 08/29/2021   Abnormal MRI, cervical spine (02/11/2019) 08/29/2021   Shoulder stiffness (Right) 08/29/2021   Pharmacologic therapy 08/27/2021   Disorder of skeletal system 08/27/2021   Problems influencing health status 08/27/2021   Anxiety, generalized 07/17/2021   Difficulty walking 07/17/2021   Headache disorder (3ry area of Pain) 07/17/2021   Photophobia 07/17/2021   Speech disturbance  07/17/2021   Tear film insufficiency 07/15/2021   Left lower quadrant abdominal pain 07/05/2021   Palpitations 06/11/2021   Axillary lymphadenopathy 06/11/2021   Immunosuppressed status ([place]) 04/19/2021   History of seronegative inflammatory arthritis 06/28/2020   Osteopenia of multiple sites 06/14/2020   Psoriatic arthritis ([place]) 06/12/2020   Iodine deficiency 06/12/2020   OSA on CPAP 06/12/2020   Chronic pain syndrome 06/12/2020   Psychophysiological insomnia 11/26/2019   Calcific tendinitis of shoulder (Right) 04/10/2019   History of iron deficiency anemia 11/04/2018   History of basal cell carcinoma 10/07/2018   Vaginal dryness 09/07/2018   Chronic jaw pain (Left) 07/15/2018   Chronic migraine with aura 07/15/2018   Irritable bowel syndrome with diarrhea 07/15/2018   Rectocele 05/21/2018   Ehlers-Danlos syndrome 03/15/2018   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  Instability of shoulder joint (Right) 01/12/2018   Trigeminal neuralgia (V1-3) (Left) 12/25/2017   Instability of shoulder joint (Left) 12/25/2017   Nausea 12/25/2017   Occipital neuralgia 12/25/2017   Dysautonomia ([place]) 12/25/2017   Hiatal hernia 10/14/2017   TMJ (dislocation of temporomandibular joint) 09/29/2017   Pulsatile tinnitus of ear (Left) 12/12/2016   Restless leg syndrome 08/01/2016   Cervical dystonia 03/21/2016   Cervical spine disease 03/21/2016   Cervicalgia 02/06/2016   Paresthesia 02/06/2016   Hypercholesterolemia 12/24/2015   Iron deficiency anemia 10/18/2015   Vitamin D deficiency disease 10/18/2015   Midline low back pain without sciatica 02/06/2015   Hyperglycemia 10/30/2014   History of colon cancer 09/27/2014   Postural orthostatic tachycardia syndrome 09/16/2003   Chronic fatigue 09/15/2000   Asthma 09/15/1988   Dyspnea 09/15/1988   Joneen Boers PT, DPT  09/24/2021, 12:00 PM  [facility] [address], [place], [address] Phone: [phone]   Fax:  [phone]  Name: Autumn Zhang MRN: [id number] Date of Birth: 04-09-1971

## 2021-09-25 ENCOUNTER — Inpatient Hospital Stay: Payer: Medicare Other | Attending: Oncology

## 2021-09-25 ENCOUNTER — Other Ambulatory Visit: Payer: Self-pay

## 2021-09-25 DIAGNOSIS — D509 Iron deficiency anemia, unspecified: Secondary | ICD-10-CM | POA: Insufficient documentation

## 2021-09-25 DIAGNOSIS — Z862 Personal history of diseases of the blood and blood-forming organs and certain disorders involving the immune mechanism: Secondary | ICD-10-CM

## 2021-09-25 DIAGNOSIS — E538 Deficiency of other specified B group vitamins: Secondary | ICD-10-CM | POA: Diagnosis not present

## 2021-09-25 LAB — IRON AND TIBC
Iron: 63 ug/dL (ref 28–170)
Saturation Ratios: 16 % (ref 10.4–31.8)
TIBC: 389 ug/dL (ref 250–450)
UIBC: 326 ug/dL

## 2021-09-25 LAB — CBC
HCT: 36.8 % (ref 36.0–46.0)
Hemoglobin: 12.2 g/dL (ref 12.0–15.0)
MCH: 30.8 pg (ref 26.0–34.0)
MCHC: 33.2 g/dL (ref 30.0–36.0)
MCV: 92.9 fL (ref 80.0–100.0)
Platelets: 304 10*3/uL (ref 150–400)
RBC: 3.96 MIL/uL (ref 3.87–5.11)
RDW: 12.6 % (ref 11.5–15.5)
WBC: 7.3 10*3/uL (ref 4.0–10.5)
nRBC: 0 % (ref 0.0–0.2)

## 2021-09-25 LAB — FERRITIN: Ferritin: 140 ng/mL (ref 11–307)

## 2021-09-25 LAB — VITAMIN B12: Vitamin B-12: 723 pg/mL (ref 180–914)

## 2021-09-25 NOTE — Progress Notes (Signed)
Established patient visit   Patient: Autumn Zhang   DOB: 09/15/1971   51 y.o. Female  MRN: [id number] Visit Date: 09/26/2021  Today's healthcare provider: [name], MD   Chief Complaint  Patient presents with   Follow-up   I,[name],acting as a scribe for [name], MD.,have documented all relevant documentation on the behalf of [name], MD,as directed by  [name], MD while in the presence of [name], MD.  Subjective    HPI  Follow up for chronic jaw pain   The patient was last seen for this 3 months ago. Changes made at last visit include referred to Oral Maxillofacial Surgery and to Pain Clinic.  She reports excellent compliance with treatment. She feels that condition is Unchanged.   Back in chronic pain without the acute on top. No more trouble speaking. Seeing PT, pain management. ----------------------------------------------------------------------------------------- Patient does want to discuss diagnosis of pancreatic insufficiency being followed by gastro.   Continues to have flares of LLQ abd/pelvic pain. Reviewed CT and [place] - stool burden  Adding allegra back has helped with her pain actually - maybe MCAS related  Medications: Outpatient Medications Prior to Visit  Medication Sig   acetaminophen (TYLENOL) 500 MG tablet Take 500 mg by mouth every 6 (six) hours as needed.   albuterol (PROVENTIL HFA;VENTOLIN HFA) 108 (90 Base) MCG/ACT inhaler Inhale 2 puffs into the lungs every 4 (four) hours as needed.    Ascorbic Acid (VITAMIN C) 1000 MG tablet Take 1,000 mg by mouth daily. probably total is 900 mg   aspirin 325 MG tablet Take 325 mg by mouth.   augmented betamethasone dipropionate (DIPROLENE-AF) 0.05 % cream APPLY TWICE DAILY AS NEEDED TO THE AFFECTED AREAS FOR 5-7 DAYS FOR ECZEMA.   b complex vitamins capsule Take 1 capsule by mouth daily. 1/4 capsule a day   BELSOMRA 5 MG TABS Take 1 tablet by mouth  at bedtime as needed.    Blood Pressure Monitoring (BLOOD PRESSURE CUFF) MISC Take blood pressure as needed for symptoms.   Boswellia Serrata (BOSWELLIA PO) Take 150 mg by mouth every morning.    Calcium Citrate 200 MG TABS Take 600 mg by mouth.   cromolyn (GASTROCROM) 100 MG/5ML solution Take by mouth 4 (four) times daily -  before meals and at bedtime.   Cyanocobalamin (VITAMIN B-12) 1000 MCG SUBL Place under the tongue.   desloratadine (CLARINEX) 5 MG tablet Take 5 mg by mouth as needed.   diphenhydrAMINE (BENADRYL) 12.5 MG/5ML liquid Take by mouth 4 (four) times daily as needed.   diphenhydrAMINE-Allantoin 2-0.5 % CREA Apply 1 application topically as needed.    docusate sodium (COLACE) 100 MG capsule Take 100 mg by mouth every other day.    econazole nitrate 1 % cream APPLY TO FEET TWICE A DAY FOR 2 4 WEEKS AS NEEDED FOR FLARE   EPINEPHrine 0.3 mg/0.3 mL IJ SOAJ injection INJECT ONE AUTO-INJECTOR INTRAMUSCULARLY AS NEEDED   estradiol (ESTRACE) 0.1 MG/GM vaginal cream INSERT 1 GRAM VAGINALLY TWICE WEEKLY   famotidine (PEPCID) 10 MG tablet Take 10 mg by mouth daily.    fexofenadine (ALLEGRA) 180 MG tablet Take 180 mg by mouth 2 (two) times daily.   guaifenesin (HUMIBID E) 400 MG TABS tablet Take 400 mg by mouth as needed.   hydrOXYzine (VISTARIL) 25 MG capsule Take by mouth. 25-50 MG PRN   IODINE EX Apply 225 mcg topically. Every 3 days   KETAMINE HCL SL Place  into right nostril as needed.   ketotifen (ZADITOR) 0.025 % ophthalmic solution Place 1 drop into both eyes daily as needed.   Ketotifen Fumarate POWD 1 mg by Does not apply route. 5-6 times a day   L-Lysine 500 MG CAPS Take by mouth 3 (three) times daily.   L-Theanine 100 MG CAPS Take 200 mg by mouth daily.    levocetirizine (XYZAL) 2.5 MG/5ML solution as needed.   Light Mineral Oil-Mineral Oil 0.5-0.5 % EMUL Apply 1 application to eye daily.   lipase/protease/amylase (CREON) 12000-38000 units CPEP capsule Take 12,000 Units by  mouth as directed.   loperamide (IMODIUM) 2 MG capsule Take by mouth as needed for diarrhea or loose stools.   Magnesium Citrate POWD Take 133 mg by mouth daily.   Melatonin 1 MG/ML LIQD Take by mouth daily.    montelukast (SINGULAIR) 5 MG chewable tablet Chew 5 mg by mouth in the morning and at bedtime.   Non Gelatin Capsules, Empty, (CAPSULE #3 CLEAR/CLEAR VEG) CAPS Take 1 capsule by mouth daily.   NONFORMULARY OR COMPOUNDED ITEM Bupivacaine nasal 0.5% drops w/o epi   omega-3 acid ethyl esters (LOVAZA) 1 g capsule Take 2 capsules (2 g total) by mouth 2 (two) times daily.   pseudoephedrine (SUDAFED) 30 MG tablet Take 30 mg by mouth every 4 (four) hours as needed for congestion.    pyridostigmine (MESTINON) 60 MG tablet Take 15 mg by mouth. 3-4 times a day   Quercetin 500 MG CAPS Take by mouth daily.   RESTASIS 0.05 % ophthalmic emulsion INSTILL 1 DROP INTO BOTH EYES TWICE A DAY   Riboflavin (VITAMIN B-2 PO) Take 100 mg by mouth 3 (three) times daily.   Rimegepant Sulfate (NURTEC) 75 MG TBDP Take 1 tablet by mouth as needed.   Selenium 200 MCG CAPS Take 1 capsule by mouth once a week.   simethicone (MYLICON) 80 MG chewable tablet Chew 80 mg by mouth every 6 (six) hours as needed for flatulence.   Sodium Fluoride 1.1 % PSTE Place onto teeth.   TRIAMCINOLONE ACETONIDE,NASAL, NA Place into the nose as needed.   trolamine salicylate (ASPERCREME/ALOE) 10 % cream Apply topically.   UBRELVY 50 MG TABS TAKE 1 TABLET BY MOUTH ONCE AS NEEDED FOR UP TO 1 DOSE   Vitamin D, Cholecalciferol, 10 MCG (400 UNIT) TABS Take by mouth daily.    [DISCONTINUED] naproxen (NAPROSYN) 250 MG tablet Take 250 mg by mouth 3 days. (Patient not taking: Reported on 09/26/2021)   No facility-administered medications prior to visit.    Review of Systems per HPI  Last CBC Lab Results  Component Value Date   WBC 7.3 09/25/2021   HGB 12.2 09/25/2021   HCT 36.8 09/25/2021   MCV 92.9 09/25/2021   MCH 30.8 09/25/2021    RDW 12.6 09/25/2021   PLT 304 16/06/9603   Last metabolic panel Lab Results  Component Value Date   GLUCOSE 103 (H) 07/02/2021   NA 140 07/02/2021   K 3.8 07/02/2021   CL 106 07/02/2021   CO2 28 07/02/2021   BUN 16 07/02/2021   CREATININE 0.62 07/02/2021   GFRNONAA >60 07/02/2021   CALCIUM 9.5 07/02/2021   PROT 7.6 07/02/2021   ALBUMIN 4.3 07/02/2021   LABGLOB 2.1 06/18/2021   AGRATIO 2.2 06/18/2021   BILITOT 0.7 07/02/2021   ALKPHOS 90 07/02/2021   AST 23 07/02/2021   ALT 18 07/02/2021   ANIONGAP 6 07/02/2021   Last lipids Lab Results  Component Value Date  CHOL 188 06/18/2021   HDL 72 06/18/2021   LDLCALC 105 (H) 06/18/2021   TRIG 59 06/18/2021   CHOLHDL 2.6 06/18/2021       Objective    BP 111/73 (BP Location: Left Arm, Patient Position: Sitting, Cuff Size: Normal)    Pulse 78    Temp 97.8 F (36.6 C) (Temporal)    Resp 16    Wt 145 lb 8 oz (66 kg)    SpO2 100%    BMI 26.61 kg/m  BP Readings from Last 3 Encounters:  09/26/21 111/73  08/28/21 129/85  08/15/21 104/68   Wt Readings from Last 3 Encounters:  09/26/21 145 lb 8 oz (66 kg)  08/28/21 142 lb (64.4 kg)  08/15/21 144 lb (65.3 kg)      Physical Exam Vitals reviewed.  Constitutional:      General: She is not in acute distress.    Appearance: Normal appearance. She is well-developed. She is not diaphoretic.  HENT:     Head: Normocephalic.     Comments: Jaw band in place Eyes:     General: No scleral icterus.    Conjunctiva/sclera: Conjunctivae normal.  Neck:     Thyroid: No thyromegaly.  Cardiovascular:     Rate and Rhythm: Normal rate and regular rhythm.     Pulses: Normal pulses.     Heart sounds: Normal heart sounds. No murmur heard. Pulmonary:     Effort: Pulmonary effort is normal. No respiratory distress.     Breath sounds: Normal breath sounds. No wheezing, rhonchi or rales.  Musculoskeletal:     Cervical back: Neck supple.     Right lower leg: No edema.     Left lower leg: No  edema.  Lymphadenopathy:     Cervical: No cervical adenopathy.  Skin:    General: Skin is warm and dry.     Findings: No rash.  Neurological:     Mental Status: She is alert and oriented to person, place, and time. Mental status is at baseline.  Psychiatric:        Mood and Affect: Mood normal.        Behavior: Behavior normal.      No results found for any visits on 09/26/21.  Assessment & Plan     Problem List Items Addressed This Visit       Digestive   Exocrine pancreatic insufficiency    Diagnosed by GI and managed by GI She is eating a very low fat and bland diet She already had vitamins B12, A, D, E, K checked by GI I will check a few others including folate, B6, selenium, zinc to ensure she does not have any malabsorption      Relevant Orders   Folate   Vitamin B1   Zinc   Vitamin B6   Selenium serum     Endocrine   Iodine deficiency    Recheck iodine      Relevant Orders   Iodine, Serum/Plasma     Nervous and Auditory   Trigeminal nerve disorder (Left) (Chronic)    Has been evaluated by Dr. Melrose Nakayama and pain management She would like a second opinion We discussed [facility] and she would like to see Dr. 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 She has previously been evaluated by Ortho, neuro She does intermittent physical therapy Discussed that her urinary frequency, which as below does not have any signs of infection or urinary incontinence and she is nondiabetic, is likely related to pelvic floor dysfunction related to her hypermobile EDS Avoid narcotics and fluoroquinolones      TMJ (dislocation of  temporomandibular joint) (Chronic)    It is doing some better with no more dysarthria She will continue to follow with her current specialists      Psoriatic arthritis ([place]) (Chronic)    Followed by rheumatology No longer on Cosentyx She is looking into other immunomodulators/Biologics        Other   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)    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 Out of the acute phase that was associated with dysphagia and dysarthria previously Continue to follow with current specialists      Chronic pain syndrome (Chronic)   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[place])    Followed by allergy and immunology Taking H1 and H2 blockers as well as Singulair to suppress histamine response We discussed some new things that do seem to cause MCAS activation Has EpiPen available In the event of hospitalization, needs Benadryl administered      Urinary frequency    No symptoms of acute infection, diabetic polyuria as her A1c is normal, or urinary incontinence Suspect this is related to pelvic floor weakness related to her hypermobile EDS Patient reports that this is a lower priority than some of her other medical conditions as above and she does not wish to see pelvic floor PT or urology at this point, but we will keep this in mind in the future      Other Visit Diagnoses     Long-term use of high-risk medication       Relevant Orders   Folate   Vitamin B1   Zinc   Vitamin B6   Iodine, Serum/Plasma   Selenium serum   Neuropathy       Relevant Orders   Vitamin B6   Unspecified mononeuropathy of bilateral lower limbs       Relevant Medications   hydrOXYzine (VISTARIL) 25 MG capsule   Other Relevant Orders   Vitamin B6        Return in about 4 months (around 01/24/2022) for chronic disease f/u.      Total time spent on today's visit was greater than 40 minutes, including both face-to-face time and nonface-to-face time personally spent on review of chart  (labs and imaging and specialist visits), discussing labs and goals, discussing further work-up, treatment options, referrals to specialist, reviewing outside records, answering patient's questions, and coordinating care.    I, [name], MD, have reviewed all documentation for this visit. The documentation on 09/26/21 for the exam, diagnosis, procedures, and orders are all accurate and complete.   Jonae Renshaw, Dionne Bucy, MD, MPH [facility]

## 2021-09-26 ENCOUNTER — Ambulatory Visit (INDEPENDENT_AMBULATORY_CARE_PROVIDER_SITE_OTHER): Payer: Medicare Other | Admitting: Family Medicine

## 2021-09-26 ENCOUNTER — Encounter: Payer: Self-pay | Admitting: Family Medicine

## 2021-09-26 VITALS — BP 111/73 | HR 78 | Temp 97.8°F | Resp 16 | Wt 145.5 lb

## 2021-09-26 DIAGNOSIS — R350 Frequency of micturition: Secondary | ICD-10-CM

## 2021-09-26 DIAGNOSIS — S0300XD Dislocation of jaw, unspecified side, subsequent encounter: Secondary | ICD-10-CM | POA: Diagnosis not present

## 2021-09-26 DIAGNOSIS — G901 Familial dysautonomia [Riley-Day]: Secondary | ICD-10-CM

## 2021-09-26 DIAGNOSIS — R6884 Jaw pain: Secondary | ICD-10-CM | POA: Diagnosis not present

## 2021-09-26 DIAGNOSIS — G509 Disorder of trigeminal nerve, unspecified: Secondary | ICD-10-CM

## 2021-09-26 DIAGNOSIS — G894 Chronic pain syndrome: Secondary | ICD-10-CM

## 2021-09-26 DIAGNOSIS — G629 Polyneuropathy, unspecified: Secondary | ICD-10-CM

## 2021-09-26 DIAGNOSIS — Q796 Ehlers-Danlos syndrome, unspecified: Secondary | ICD-10-CM | POA: Diagnosis not present

## 2021-09-26 DIAGNOSIS — E618 Deficiency of other specified nutrient elements: Secondary | ICD-10-CM

## 2021-09-26 DIAGNOSIS — D8940 Mast cell activation, unspecified: Secondary | ICD-10-CM

## 2021-09-26 DIAGNOSIS — G5793 Unspecified mononeuropathy of bilateral lower limbs: Secondary | ICD-10-CM

## 2021-09-26 DIAGNOSIS — Z79899 Other long term (current) drug therapy: Secondary | ICD-10-CM

## 2021-09-26 DIAGNOSIS — L4050 Arthropathic psoriasis, unspecified: Secondary | ICD-10-CM

## 2021-09-26 DIAGNOSIS — K8681 Exocrine pancreatic insufficiency: Secondary | ICD-10-CM

## 2021-09-26 NOTE — Assessment & Plan Note (Signed)
Diagnosed by GI and managed by GI She is eating a very low fat and bland diet She already had vitamins B12, A, D, E, K checked by GI I will check a few others including folate, B6, selenium, zinc to ensure she does not have any malabsorption

## 2021-09-26 NOTE — Assessment & Plan Note (Signed)
Followed by rheumatology No longer on Cosentyx She is looking into other immunomodulators/Biologics

## 2021-09-26 NOTE — Assessment & Plan Note (Signed)
No symptoms of acute infection, diabetic polyuria as her A1c is normal, or urinary incontinence Suspect this is related to pelvic floor weakness related to her hypermobile EDS Patient reports that this is a lower priority than some of her other medical conditions as above and she does not wish to see pelvic floor PT or urology at this point, but we will keep this in mind in the future

## 2021-09-26 NOTE — Assessment & Plan Note (Signed)
Chronic and stable No current flare Followed by cardiology

## 2021-09-26 NOTE — Assessment & Plan Note (Signed)
It is doing some better with no more dysarthria She will continue to follow with her current specialists

## 2021-09-26 NOTE — Assessment & Plan Note (Signed)
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

## 2021-09-26 NOTE — Assessment & Plan Note (Signed)
Followed by allergy and immunology Taking H1 and H2 blockers as well as Singulair to suppress histamine response We discussed some new things that do seem to cause MCAS activation Has EpiPen available In the event of hospitalization, needs Benadryl administered

## 2021-09-26 NOTE — Assessment & Plan Note (Signed)
Has been evaluated by Dr. Melrose Nakayama and pain management She would like a second opinion We discussed [facility] and she would like to see Dr. Billey Gosling Referral placed today

## 2021-09-26 NOTE — Assessment & Plan Note (Signed)
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

## 2021-09-26 NOTE — Assessment & Plan Note (Signed)
Recheck iodine

## 2021-09-30 ENCOUNTER — Encounter: Payer: Self-pay | Admitting: Oncology

## 2021-10-01 ENCOUNTER — Encounter: Payer: Self-pay | Admitting: Family Medicine

## 2021-10-01 ENCOUNTER — Ambulatory Visit: Payer: Medicare Other

## 2021-10-01 DIAGNOSIS — M6281 Muscle weakness (generalized): Secondary | ICD-10-CM

## 2021-10-01 DIAGNOSIS — G8929 Other chronic pain: Secondary | ICD-10-CM

## 2021-10-01 DIAGNOSIS — M26609 Unspecified temporomandibular joint disorder, unspecified side: Secondary | ICD-10-CM

## 2021-10-01 DIAGNOSIS — R6884 Jaw pain: Secondary | ICD-10-CM | POA: Diagnosis not present

## 2021-10-01 LAB — FOLATE: Folate: 11.2 ng/mL (ref >3.0)

## 2021-10-01 LAB — VITAMIN B6: Vitamin B6: 19.5 ug/L (ref 3.4–65.2)

## 2021-10-01 LAB — IODINE, SERUM/PLASMA: Iodine: 42.3 ug/L (ref 40.0–92.0)

## 2021-10-01 LAB — ZINC: Zinc: 81 ug/dL (ref 44–115)

## 2021-10-01 LAB — VITAMIN B1: Thiamine: 137.8 nmol/L (ref 66.5–200.0)

## 2021-10-01 NOTE — Therapy (Signed)
[place] ° [facility] °[address] °[place], [place], [address] °Phone: [phone]   Fax:  [phone] ° °Physical Therapy Treatment ° °Patient Details  °Name: Autumn Zhang °MRN: [id number] °Date of Birth: 05/22/1971 °Referring Provider (PT): Andrew Kubinski, DO ° ° °Encounter Date: 10/01/2021 ° ° PT End of Session - 10/01/21 1106   ° ° Visit Number 6   ° Number of Visits 9   ° Date for PT Re-Evaluation 10/24/21   ° PT Start Time 1106   ° PT Stop Time 1148   ° PT Time Calculation (min) 42 min   ° Activity Tolerance Patient limited by pain;Patient limited by fatigue   ° Behavior During Therapy WFL for tasks assessed/performed   ° °  °  ° °  ° ° °Past Medical History:  °Diagnosis Date  ° Allergy   ° Anemia   ° Anxiety   ° Arthritis   ° Asthma   ° Attention deficit hyperactivity disorder (ADHD), combined type 10/18/2015  ° Diagnosed many years ago.  Has been doing well on Ritalin 5mg QID to six times a day. Long acting hasn't worked well in the past and she has not wanted to try Adderall. She is taking wellbutrin as well, but she has been weaning medication due to SE migraine.  She is now down to Wellbutrin one quarter tablet daily.  Last Assessment & Plan:  A/P: Stable.  Continue current dosing.  Refilled ×3 months  ° Basal cell carcinoma 05/16/2021  ° Superficial, R ant lower leg. [facility] [place], [place]  ° BRCA negative 09/2018  ° 2015 [place] at [facility]; has VUS; 1/20 MyRisk neg except RPS20 VUS; IBIS=10.8%/riskscore=12%  ° Colon cancer (HCC)   ° Dysautonomia (HCC)   ° Ehlers-Danlos syndrome type III   ° Family history of colon cancer   ° GERD (gastroesophageal reflux disease)   ° Hyperlipidemia   ° Idiopathic small fiber peripheral neuropathy   ° Leiomyoma   ° Mast cell activation (HCC)   ° Migraines   ° Neck pain   ° Occipital neuralgia   ° Sleep apnea   ° ° °Past Surgical History:  °Procedure Laterality Date  ° APPENDECTOMY    ° removed with colectomy  °  BREAST SURGERY    ° reduction  ° COLON SURGERY  2008  ° for colon cancer, ~1/3 of colon removed, primary reanastimosis  ° COLONOSCOPY WITH PROPOFOL N/A 10/07/2018  ° Procedure: COLONOSCOPY WITH PROPOFOL;  Surgeon: Vanga, Rohini Reddy, MD;  Location: [facility];  Service: Gastroenterology;  Laterality: N/A;  ° COLONOSCOPY WITH PROPOFOL N/A 08/14/2021  ° Procedure: COLONOSCOPY WITH PROPOFOL;  Surgeon: Vanga, Rohini Reddy, MD;  Location: [facility];  Service: Gastroenterology;  Laterality: N/A;  ° ENDOMETRIAL ABLATION    ° trigeminal stimulator    ° ° °There were no vitals filed for this visit. ° ° Subjective Assessment - 10/01/21 1116   ° ° Subjective L jaw is hurting 6/10 currently. Had and MRI last week on Saturday. Walking is getting better, able to walk 25 minutes.   ° Pertinent History TMJ. Had PT at [facility] which went well. No masks however, and preferes masks due to immune compromise. Tried learning to engage the pterygoid muscle. Has to learn how to use the right muscles due to EDH. 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 L jaw bothers her a lot. R jaw dislocates but can pop it back in. L jaw   pops out but the soft tissue is very loose that even at rest, it is subluxed.   ° Currently in Pain? Yes   ° Pain Score 6    ° °  °  ° °  ° ° ° ° ° ° ° ° ° ° ° ° ° ° ° ° ° ° ° ° ° ° ° ° ° ° ° ° ° PT Education - 10/01/21 1255   ° ° Education Details ther-ex   ° Person(s) Educated Patient   ° Methods Explanation;Demonstration;Tactile cues;Verbal cues   ° Comprehension Returned demonstration;Verbalized understanding   ° °  °  ° °  ° ° °Objective  °  °Per pt. Cardiologist and PCP did not giver her any BP or HR range for exercises. Just to go to ER of she has signs and symptoms of a heart attack °  °  °  °PT exercise from previous location: PT does isometric L jaw deviation in netural to work Pterygoid ( R lateral ptergoid muscle.) °Also opening and closing jaw (about a little less than 1 cm with manual assist  to prevent lateral deviation and promote training for neutral opening and closing jaw.  °Has been wearing her jaw strap since 2019. Can eat with jaw strap very soft solids. Has neck pain, suboccipital muscle pain. (CCI craniocervical instability).  °  °Also does jaw protraction and return to neutral in about less than 1 cm opening.  °  °Does neck exercises but had to stop due to a flair up mid October which made it difficult to activate the proper muscles.  °  °Does isometric neck exercises: reclined position °            R and L eye movement °            Looking down with eyes and looking up with eyes with neutral head.  °            Cervical isometrics in neutral (flexion, side bend, chin tucks) °            Neurosurgeon told her to limit neck movement.  °            Wears a cervical collar in the car per surgoen's recommendation.  °  °  °  °Email:[email or web address] °  °Medbridge Access Code [email or web address] °  °(R jaw deviation movement per pt) °  °  °  °Lights off secondary to light sensitivity.  °  °Therapeutic exercises  °Reviewed HEP per pt request ° Reclined: °Reclined R and L cervical side bend isometrics in neutral.  ° Chin tucks  ° Deep cervical flexion (mini lifts)  ° Standing scapular and shoulder mini forward, neutral, back, neutral, up, neutral, down neutral for R UE only.  ° ° Standing L and R shoulder flexion with emphasis on extension to neutral with scapular retraction ° ° Standing R and L shoulder ER/IR rhythmic stabilization with emphasis on proper scapular position and scapular muscle activation  ° °Standing R weight shift onto R scapula to activate R rhomboid to decrease L cervical side bend posture.  ° °Table push-up position scapular protraction  ° Difficult. Try wall push-up position next time instead.  ° °   °Improved exercise technique, movement at target joints, use of target muscles after mod to max verbal, visual, tactile cues.  °   °Response to treatment/Clinical impression °Improving ability  to ambulate longer distances with less jaw pain based on subjective reports. Reviewed all PT   HEP per pt request within time limits. Cues needed for proper form and technique. Pt tolerated session well without aggravation of symptoms. Pt will benefit from continued skilled physical therapy services to continue to improve jaw strength and neutral jaw movements, as well as cervical and scapular strengthening, and core strength to help decrease jaw instability and dislocations. ° ° ° ° ° ° PT Short Term Goals - 08/26/21 1325   ° °  ° PT SHORT TERM GOAL #1  ° Title Patient will be independent with her initial HEP to decrease pain, improve strength, and ability to open and close her mouth more comfortably.   ° Baseline Pt has started her HEP (08/26/2021)   ° Time 3   ° Period Weeks   ° Status New   ° Target Date 09/19/21   ° °  °  ° °  ° ° ° ° PT Long Term Goals - 08/26/21 1327   ° °  ° PT LONG TERM GOAL #1  ° Title Pt will have a decrease in L jaw pain to 3/10 or less at worst to promote ability to open and close her mouth, eat, as well as ambulate more comfortably.   ° Baseline 9/10 L jaw pain at worst for the past 3 months   ° Time 8   ° Period Weeks   ° Status New   ° Target Date 10/24/21   °  ° PT LONG TERM GOAL #2  ° Title Pt will have a decrease in R jaw pain to 2/10 or less at worst to promote ability to open and close her mouth, eat, as well as ambulate more comfortably.   ° Baseline 5/10 R jaw pain at worst for the past 3 months (08/26/2021)   ° Time 8   ° Period Weeks   ° Status New   ° Target Date 10/24/21   °  ° PT LONG TERM GOAL #3  ° Title Pt will improve her FOTO score by at least 10 points as a demonstration of improved function.   ° Baseline Orofascial FOTO 40 (pt might have answered questions based on wearing her jaw strap which helps decrease pain) 08/26/2021.   ° Time 8   ° Period Weeks   ° Status New   ° Target Date 10/24/21   °  ° PT LONG TERM GOAL #4  ° Title Pt will report decrease in L jaw pain  when ambulating to promote ability to walk longer distances.   ° Baseline Increased L jaw pain with gait, limiting the distance she ambulates (08/26/2021)   ° Time 8   ° Period Weeks   ° Status New   ° Target Date 10/24/21   ° °  °  ° °  ° ° ° ° ° ° ° ° Plan - 10/01/21 1256   ° ° Clinical Impression Statement Improving ability to ambulate longer distances with less jaw pain based on subjective reports. Reviewed all PT HEP per pt request within time limits. Cues needed for proper form and technique. Pt tolerated session well without aggravation of symptoms. Pt will benefit from continued skilled physical therapy services to continue to improve jaw strength and neutral jaw movements, as well as cervical and scapular strengthening, and core strength to help decrease jaw instability and dislocations.   ° Personal Factors and Comorbidities Comorbidity 3+;Time since onset of injury/illness/exacerbation;Fitness;Past/Current Experience   ° Comorbidities Ehlers-Danlos syndrome type III, dysautonomia, occipital neuralgia, basal cell carcinoma, colon CA, arthritis   °   Examination-Activity Limitations Self Feeding;Locomotion Level   ° Stability/Clinical Decision Making Stable/Uncomplicated   ° Rehab Potential Fair   ° PT Frequency 1x / week   ° PT Duration 8 weeks   ° PT Treatment/Interventions Therapeutic activities;Therapeutic exercise;Neuromuscular re-education;Patient/family education;Manual techniques;Dry needling;Biofeedback;Electrical Stimulation;Iontophoresis 4mg/ml Dexamethasone   ° PT Next Visit Plan posture, core, scapular, cervical, isometric jaw strengthening, manual techniques, modalities PRN   ° PT Home Exercise Plan Medbridge Access Code [email or web address]   ° Consulted and Agree with Plan of Care Patient   ° °  °  ° °  ° ° °Patient will benefit from skilled therapeutic intervention in order to improve the following deficits and impairments:  Pain, Improper body mechanics, Postural dysfunction, Decreased strength,  Difficulty walking ° °Visit Diagnosis: °Chronic jaw pain ° °Muscle weakness (generalized) ° °TMJ (temporomandibular joint syndrome) ° ° ° ° °Problem List °Patient Active Problem List  ° Diagnosis Date Noted  ° Exocrine pancreatic insufficiency 09/26/2021  ° Urinary frequency 09/26/2021  ° Atypical facial pain (1ry area of Pain) (Left) 08/29/2021  ° Chronic ear pain (Left) 08/29/2021  ° Temporal headache (Left) 08/29/2021  ° Inner ear pain (Left) 08/29/2021  ° Trigeminal nerve disorder (Left) 08/29/2021  ° History of photophobia 08/29/2021  ° TMJ (temporomandibular joint syndrome) 08/29/2021  ° 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  ° Polypharmacy 08/29/2021  ° Severe frontal headaches 08/29/2021  ° Chronic occipital headache (Midline) (2ry area of Pain) 08/29/2021  ° Chronic neck pain (posterior) (4th area of Pain) (Bilateral) (L>R) 08/29/2021  ° Musculoskeletal disorder involving upper trapezius muscle 08/29/2021  ° Musculoskeletal disorder involving sternocleidomastoid (Left) 08/29/2021  ° Abnormal MRI, cervical spine (02/11/2019) 08/29/2021  ° Shoulder stiffness (Right) 08/29/2021  ° Pharmacologic therapy 08/27/2021  ° Disorder of skeletal system 08/27/2021  ° Problems influencing health status 08/27/2021  ° Anxiety, generalized 07/17/2021  ° Difficulty walking 07/17/2021  ° Headache disorder (3ry area of Pain) 07/17/2021  ° Photophobia 07/17/2021  ° Speech disturbance 07/17/2021  ° Tear film insufficiency 07/15/2021  ° Left lower quadrant abdominal pain 07/05/2021  ° Palpitations 06/11/2021  ° Axillary lymphadenopathy 06/11/2021  ° Immunosuppressed status (HCC) 04/19/2021  ° History of seronegative inflammatory arthritis 06/28/2020  ° Osteopenia of multiple sites 06/14/2020  ° Psoriatic arthritis (HCC) 06/12/2020  ° Iodine deficiency 06/12/2020  ° OSA on CPAP 06/12/2020  ° Chronic pain syndrome 06/12/2020  ° Psychophysiological insomnia 11/26/2019  ° Calcific tendinitis of shoulder (Right) 04/10/2019  °  History of iron deficiency anemia 11/04/2018  ° History of basal cell carcinoma 10/07/2018  ° Vaginal dryness 09/07/2018  ° Chronic jaw pain (Left) 07/15/2018  ° Chronic migraine with aura 07/15/2018  ° Irritable bowel syndrome with diarrhea 07/15/2018  ° Rectocele 05/21/2018  ° Ehlers-Danlos syndrome 03/15/2018  ° 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  ° 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  ° Instability of shoulder joint (Right) 01/12/2018  ° Trigeminal neuralgia (V1-3) (Left) 12/25/2017  ° Instability of shoulder joint (Left) 12/25/2017  ° Nausea 12/25/2017  ° Occipital neuralgia 12/25/2017  ° 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  ° Hiatal hernia 10/14/2017  ° TMJ (dislocation of temporomandibular joint) 09/29/2017  ° Pulsatile tinnitus of ear (Left) 12/12/2016  ° Restless leg syndrome 08/01/2016  ° Cervical dystonia 03/21/2016  ° Cervical spine disease 03/21/2016  ° Cervicalgia 02/06/2016  ° Paresthesia 02/06/2016  ° Hypercholesterolemia 12/24/2015  ° Iron deficiency anemia 10/18/2015  ° Vitamin D deficiency disease 10/18/2015  ° Midline low back pain without sciatica 02/06/2015  ° Hyperglycemia 10/30/2014  °   History of colon cancer 09/27/2014   Postural orthostatic tachycardia syndrome 09/16/2003   Chronic fatigue 09/15/2000   Asthma 09/15/1988   Dyspnea 09/15/1988   Joneen Boers PT, DPT  10/01/2021, 1:03 PM  [facility] [address], [place], [address] Phone: [phone]   Fax:  [phone]  Name: Autumn Zhang MRN: [id number] Date of Birth: 12/12/1970

## 2021-10-03 ENCOUNTER — Ambulatory Visit
Admission: RE | Admit: 2021-10-03 | Discharge: 2021-10-03 | Disposition: A | Discharge status: Home / Self Care | Payer: Medicare Other | Source: Ambulatory Visit | Attending: Obstetrics and Gynecology | Admitting: Obstetrics and Gynecology

## 2021-10-03 ENCOUNTER — Other Ambulatory Visit: Payer: Self-pay

## 2021-10-03 DIAGNOSIS — N839 Noninflammatory disorder of ovary, fallopian tube and broad ligament, unspecified: Secondary | ICD-10-CM | POA: Insufficient documentation

## 2021-10-03 DIAGNOSIS — Z1273 Encounter for screening for malignant neoplasm of ovary: Secondary | ICD-10-CM | POA: Diagnosis present

## 2021-10-08 DIAGNOSIS — L409 Psoriasis, unspecified: Secondary | ICD-10-CM | POA: Insufficient documentation

## 2021-10-08 NOTE — Progress Notes (Signed)
PROVIDER NOTE: Information contained herein reflects review and annotations entered in association with encounter. Interpretation of such information and data should be left to medically-trained personnel. Information provided to patient can be located elsewhere in the medical record under "Patient Instructions". Document created using STT-dictation technology, any transcriptional errors that may result from process are unintentional.    Patient: Elliot Cousin  Service Category: E/M  Provider: Gaspar Cola, MD  DOB: [birth year]  DOS: 10/09/2021  Specialty: Interventional Pain Management  MRN: [id number]  Setting: Ambulatory outpatient  PCP: Virginia Crews, MD  Type: Established Patient    Referring Provider: Virginia Crews, MD  Location: Office  Delivery: Face-to-face     Primary Reason(s) for Visit: Encounter for evaluation before starting new chronic pain management plan of care (Level of risk: moderate) CC: Jaw Pain (Left side is worse) and Neck Pain  HPI  Ms. Timothy is a 51 y.o. year old, female patient, who comes today for a follow-up evaluation to review the test results and decide on a treatment plan. 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[place]); Midline low back pain without sciatica; Nausea; Cervicalgia; Occipital neuralgia; Paresthesia; Patellar instability (Left); Postural orthostatic tachycardia syndrome; Pulsatile tinnitus of ear (Left); Rectocele; Restless leg syndrome; TMJ (dislocation of temporomandibular joint); Dysautonomia ([place]); History of basal cell carcinoma; History of iron deficiency anemia; Calcific tendinitis  of shoulder (Right); Psoriatic arthritis ([place]); Iodine deficiency; OSA on CPAP; Chronic pain syndrome; Osteopenia of multiple sites; History of seronegative inflammatory arthritis; Iron deficiency anemia; Irritable bowel syndrome; Vitamin D deficiency disease; Psychophysiological insomnia; Palpitations; Axillary lymphadenopathy; Left lower quadrant abdominal pain; Immunosuppressed status ([place]); Tear film insufficiency; Anxiety, generalized; Difficulty walking; Headache disorder (3ry area of Pain); Photophobia; Speech disturbance; Pharmacologic therapy; Disorder of skeletal system; Problems influencing health status; Atypical facial pain (1ry area of Pain) (Left); Chronic ear pain (Left); Temporal headache (Left); Inner ear pain (Left); Trigeminal nerve disorder (Left); History of photophobia; TMJ (temporomandibular joint syndrome); Scintillating scotoma (Bilateral); Polypharmacy; Severe frontal headaches; Chronic occipital headache (Midline) (2ry area of Pain); Chronic neck pain (posterior) (4th area of Pain) (Bilateral) (L>R); Musculoskeletal disorder involving upper trapezius muscle; Musculoskeletal disorder involving sternocleidomastoid (Left); Abnormal MRI, cervical spine (02/11/2019); Shoulder stiffness (Right); Exocrine pancreatic insufficiency; Urinary frequency; Anal fissure; Anemia; Cardiac arrhythmia; Gastroesophageal reflux disease; Hemorrhoids; Malignant neoplasm of skin; Malignant tumor of colon ([place]); Polyp of colon; Psoriasis; Arthritis; High cholesterol; Fibromyalgia; Chronic constipation; and Cervical spondylosis with radiculopathy on their problem list. Her primarily concern today is the Jaw Pain (Left side is worse) and Neck Pain  Pain Assessment: Location: Right, Left Jaw Radiating: neck Onset: More than a month ago Duration: Chronic pain Quality: Constant, Aching, Burning, Crushing, Crying, Discomfort, Dull, Grimacing, Guarding, Headache, Heaviness, Jabbing, Moaning, Nagging, Numbness,  Penetrating, Pins and needles, Pounding, Pressure, Radiating, Restless, Sore, Spasm, Squeezing, Stabbing, Tender, Throbbing, Tightness, Tingling, Tiring Severity: 5 /10 (subjective, self-reported pain score)  Effect on ADL:   Timing: Constant Modifying factors: rest, support bands, ice, meds, BP: 121/80   HR: 78  Ms. Wieand comes in today for a follow-up visit after her initial evaluation on 08/28/2021. Today we went over the results of her tests. These were explained in "Layman's terms". During today's appointment we went over my diagnostic  impression, as well as the proposed treatment plan.  Review of initial evaluation (08/28/2021): "According to the patient the primary area of pain is that of the left side of the face, anterior to the ear, with the pain being deep and radiating into the left jaw.  The patient also has pain over the V1, V2, V3 trigeminal nerve distributions.  This pain radiates to the temporal region, around the ear, and down the neck through the sternocleidomastoid muscle.  She describes inner ear pain which is dull, constant, waxes and wanes, with occasional sharp pain.  She will experience 2-3 episodes of the sharp pain per month.  The pain is associated with blurred vision, constant light sensitivity (photophobia) which predates her jaw pain, scintillating scotoma's, tinnitus and she has been told that she has trigeminal neuralgia.  She indicates having had a CT angiogram done around 2018, but no MRIs looking at the trigeminal nerve (gasserian ganglion).  She denies any surgeries in that area but does indicate having had multiple evaluations and nerve blocks through the years.  The jaw pain is also associated with a diagnosis of temporomandibular joint syndrome and recurrent temporomandibular joint dislocations.  The patient's secondary area pain is that of the back of the head in the occipital region mostly in the midline causing headaches that follow the distribution of the  greater occipital nerve bilaterally and lesser occipital nerve on the left side.  She indicates having been told that she has craniocervical instability but denies any rheumatoid arthritis.  She does have a rheumatologist at [facility].  These occipital headaches are associated with left jaw pain and they have been evaluated by neurosurgeons and neurologist.  The patient's third area pain is that of headaches with the patient describing what she calls different types of headaches.  The first headache she describes as being in the occipital region, midline, with radiation towards the front, bilaterally.  The second headache she describes in the top of the head, bilaterally, associated also with the occipital headaches.  She describes this pain to have a late day onset and to remain there until she goes to sleep.  The third type of headache is unilateral on the left side affecting the temporal region (Left) of the face.  She describes this headache to be constant while the other 2 are intermittent.  The fourth type of headache is described as a spike headache that will usually be unilateral and lasting only for an hour.  She describes that most of these headaches will not respond to medication for migraines, except when she does have an actual migraine in which case they tend to improve.  The patient's fourth area pain is that of the neck (Bilateral) (L>R) (posterior).  She describes this as being different on the right side and on the left side.  In the case of the left side the neck pain tends to be more on the lateral aspect of the neck following the sternocleidomastoid muscle.  In the case of the right side it is more of a muscle tightness that involves the shoulder and what appears to be the trapezius muscle.  She describes that this pain on the right side and associated with popping sensations and a sharp pain depending on how she moves the neck.  She also refers having had some nerve conduction test indicating  that she has dystonia.  The patient indicates having had multiple imaging studies, multiple nerve blocks, multiple nerve conduction test, and the medical record seems to confirm  that she has had those done.  Unfortunately, the reports and none of those are available since the medical record indicates "reference only".  The patient denies any upper extremity symptoms, neck surgery, and indicates having had physical therapy for the neck pain in multiple locations and she is actually currently involved in some physical therapy at this point.  The patient has an Ehlers-Danlos syndrome, and multiple other medical problems.  The patient was initially seen by Dr. Gillis Santa, whose initial evaluation reads as follows: "51 year old female with a history of Ehlers-Danlos syndrome, mast cell activation syndrome, joint hypermobility, cervical dystonia, chronic migraines, trigeminal neuralgia, TMJ dysfunction, pots syndrome who presents with a chief complaint of generalized body aches as well as headaches.  Patient has a complex medical history.  She was previously in the [place] area.  For her complex migraines, she has tried occipital nerve blocks as well as SPG blocks.  She states that the SPG blocks were somewhat helpful initially.  She is hoping to establish with pain management to find a plan for when she has pain flares.  Patient would like to avoid opioid therapy.  She cannot tolerate NSAIDs given her mast cell syndrome.  In the past she states that her LFTs have been elevated but that Tylenol provides her with the most pain relief.  She takes 500 mg daily.  She states that when her LFTs were elevated she was taking 2 to 3 g daily for 3 months.  Muscle relaxants are not indicated especially in the context of her Ehlers-Danlos and joint hypermobility.  Patient does participate in physical therapy once to twice a week.  She avoids heavy resistance and isometric exercises and participates in low impact  exercises to minimize bone and joint damage."  Today we went over the results of all the testing ordered on the patient's initial evaluation.  I have explained the results with the patient in layman's terms.  In addition, I have reviewed the patient's TMJ MRI.  She is scheduled to see the maxillofacial surgeon on follow-up tomorrow.  She does seem to have that most of this pain seems to be coming from her left temporomandibular joint.  I have explained to the patient that this is outside of my realm of expertise and that I would defer further explanation on what is going on with the joint to the maxillofacial surgeon.  I have also suggested that she talk to him for alternatives on the long-term care of this.  One of the patient's concerns is associated with what to do when she has a flareup.  Unfortunately she has a very long list of allergies.  The electronic medical record lists 44 different allergies.  This includes most pain medications and even lidocaine.  She refers being able to tolerate bupivacaine and ropivacaine, but not lidocaine.  She also indicates that she tried oral steroids but they did not help suggesting that this pain is not primarily inflammatory in nature.  However, flareups of the pain may be.  She asked me about the possible effectiveness of different things like there stellate ganglion blocks and even trigeminal nerve blocks.  I did answer her questions to the best of my abilities.  She wanted to see if we could come up with some type of a plan to enact whenever she is having a flareup.  To this effect and understanding that none of these blocks that we perform will give her permanent relief of her pain, I have suggested that perhaps  trying a TMJ injection at the time of flareup could be an alternative.  Alternatively, should that not work, we could try a V2/V3 trigeminal nerve block to see if that could provide her with some benefit.  She also asked me about occasional sphenopalatine  nerve blocks which she has found them to provide her with partial temporary relief of the pain.  I told her that as long as they continue working for her, that could be an alternative.  She says that she gets those blocks done with bupivacaine.  In terms of medications to help with her pain, she indicates that in the past she has used some ketamine spray.  She asked me about prescribing that for her and I have informed her that at this time, we have a shortage in manpower and therefore I am really not taking any patients for medication management.  She understood and accepted.  She indicated that when she has a flareup of her pain, she may give [place] a call to see if we can go ahead and do the TMJ injections.  I have agreed to try that if this is something that she is interested in.  Controlled Substance Pharmacotherapy Assessment REMS (Risk Evaluation and Mitigation Strategy)  Opioid Analgesic: Dextroamphetamine/amphetamine 5 mg tablet 1 twice daily; Vimpat 50 mg tablet 1 twice daily Current opioid analgesics: None MME/day: 0 mg/day   Pill Count: None expected due to no prior prescriptions written by our practice. Hart Rochester, RN  10/09/2021 11:48 AM  Signed Safety precautions to be maintained throughout the outpatient stay will include: orient to surroundings, keep bed in low position, maintain call bell within reach at all times, provide assistance with transfer out of bed and ambulation.    Pharmacokinetics: Liberation and absorption (onset of action): WNL Distribution (time to peak effect): WNL Metabolism and excretion (duration of action): WNL         Pharmacodynamics: Desired effects: Analgesia: Ms. Deemer reports >50% benefit. Functional ability: Patient reports that medication allows her to accomplish basic ADLs Clinically meaningful improvement in function (CMIF): Sustained CMIF goals met Perceived effectiveness: Described as relatively effective, allowing for increase in  activities of daily living (ADL) Undesirable effects: Side-effects or Adverse reactions: None reported Monitoring: [place] PMP: PDMP reviewed during this encounter. Online review of the past 71-monthperiod previously conducted. Not applicable at this point since we have not taken over the patient's medication management yet. List of other Serum/Urine Drug Screening Test(s):  No results found for: AMPHSCRSER, BARBSCRSER, BENZOSCRSER, COCAINSCRSER, COCAINSCRNUR, PCPSCRSER, THCSCRSER, THCU, CANNABQUANT, [place] UDS test(s) done:  Lab Results  Component Value Date   SUMMARY Note 08/28/2021   Last UDS on record: Summary  Date Value Ref Range Status  08/28/2021 Note  Final    Comment:    ==================================================================== Compliance Drug Analysis, Ur ==================================================================== Test                             Result       Flag       Units  Drug Present and Declared for Prescription Verification   Acetaminophen                  PRESENT      EXPECTED  Drug Absent but Declared for Prescription Verification   Salicylate  Not Detected UNEXPECTED    Aspirin, as indicated in the declared medication list, is not always    detected even when used as directed.    Diphenhydramine                Not Detected UNEXPECTED   Guaifenesin                    Not Detected UNEXPECTED ==================================================================== Test                      Result    Flag   Units      Ref Range   Creatinine              31               mg/dL      >=20 ==================================================================== Declared Medications:  The flagging and interpretation on this report are based on the  following declared medications.  Unexpected results may arise from  inaccuracies in the declared medications.   **Note: The testing scope of  this panel includes these medications:   Diphenhydramine (Benadryl)  Guaifenesin   **Note: The testing scope of this panel does not include small to  moderate amounts of these reported medications:   Acetaminophen (Tylenol)  Aspirin   **Note: The testing scope of this panel does not include the  following reported medications:   Albuterol (Ventolin HFA)  Betamethasone  Calcium  Cromolyn  Cyclosporine (Restasis)  Docusate (Colace)  Econazole  Epinephrine (EpiPen)  Estradiol (Estrace)  Famotidine (Pepcid)  Fexofenadine (Allegra)  Indomethacin (Indocin)  Supplement  Topical  Vitamin B  Vitamin B12  Vitamin C  Vitamin D ==================================================================== For clinical consultation, please call [phone]. ====================================================================    UDS interpretation: No unexpected findings.          Medication Assessment Form: Patient introduced to form today Treatment compliance: Treatment may start today if patient agrees with proposed plan. Evaluation of compliance is not applicable at this point Risk Assessment Profile: Aberrant behavior: See initial evaluations. None observed or detected today Comorbid factors increasing risk of overdose: See initial evaluation. No additional risks detected today Opioid risk tool (ORT):  Opioid Risk  08/28/2021  Alcohol 0  Illegal Drugs 0  Rx Drugs 0  Alcohol 0  Illegal Drugs 0  Rx Drugs 0  Age between 16-45 years  0  History of Preadolescent Sexual Abuse 0  Psychological Disease 0  Depression 0  Opioid Risk Tool Scoring 0  Opioid Risk Interpretation Low Risk    ORT Scoring interpretation table:  Score <3 = Low Risk for SUD  Score between 4-7 = Moderate Risk for SUD  Score >8 = High Risk for Opioid Abuse   Risk of substance use disorder (SUD): Low  Risk Mitigation Strategies:  Patient opioid safety counseling: Completed today. Counseling provided to  patient as per "Patient Counseling Document". Document signed by patient, attesting to counseling and understanding Patient-Prescriber Agreement (PPA): Obtained today.  Controlled substance notification to other providers: Written and sent today.  Pharmacologic Plan: Non-opioid analgesic therapy offered. Interventional alternatives discussed.             Laboratory Chemistry Profile   Renal Lab Results  Component Value Date   BUN 16 07/02/2021   CREATININE 0.62 07/02/2021   BCR 17 06/18/2021   GFRAA 119 06/12/2020   GFRNONAA >60 07/02/2021   SPECGRAV 1.020 08/02/2020   PHUR 6.0 08/02/2020   PROTEINUR Negative  08/02/2020     Electrolytes Lab Results  Component Value Date   NA 140 07/02/2021   K 3.8 07/02/2021   CL 106 07/02/2021   CALCIUM 9.5 07/02/2021   MG 2.2 08/28/2021     Hepatic Lab Results  Component Value Date   AST 23 07/02/2021   ALT 18 07/02/2021   ALBUMIN 4.3 07/02/2021   ALKPHOS 90 07/02/2021     ID Lab Results  Component Value Date   HIV Non Reactive 06/12/2020   SARSCOV2NAA NEGATIVE 07/01/2019   PREGTESTUR NEGATIVE 08/14/2021     Bone Lab Results  Component Value Date   VD25OH 32.8 06/12/2020   25OHVITD1 39 08/28/2021   25OHVITD2 1.5 08/28/2021   25OHVITD3 37 08/28/2021     Endocrine Lab Results  Component Value Date   GLUCOSE 103 (H) 07/02/2021   GLUCOSEU NEGATIVE 11/18/2019   HGBA1C 5.1 06/18/2021   TSH 1.770 06/18/2021   FREET4 1.13 06/12/2020     Neuropathy Lab Results  Component Value Date   VITAMINB12 723 09/25/2021   FOLATE 11.2 09/26/2021   HGBA1C 5.1 06/18/2021   HIV Non Reactive 06/12/2020     CNS No results found for: COLORCSF, APPEARCSF, RBCCOUNTCSF, WBCCSF, POLYSCSF, LYMPHSCSF, EOSCSF, PROTEINCSF, GLUCCSF, JCVIRUS, CSFOLI, IGGCSF, LABACHR, ACETBL, LABACHR, ACETBL   Inflammation (CRP: Acute   ESR: Chronic) Lab Results  Component Value Date   ESRSEDRATE 34 01/04/2020     Rheumatology No results found for: RF,  ANA, LABURIC, URICUR, LYMEIGGIGMAB, LYMEABIGMQN, HLAB27   Coagulation Lab Results  Component Value Date   PLT 304 09/25/2021   VITAMINK1 1.51 08/22/2021     Cardiovascular Lab Results  Component Value Date   HGB 12.2 09/25/2021   HCT 36.8 09/25/2021     Screening Lab Results  Component Value Date   SARSCOV2NAA NEGATIVE 07/01/2019   HIV Non Reactive 06/12/2020   PREGTESTUR NEGATIVE 08/14/2021     Cancer No results found for: CEA, CA125, LABCA2   Allergens No results found for: ALMOND, APPLE, ASPARAGUS, AVOCADO, BANANA, BARLEY, BASIL, BAYLEAF, GREENBEAN, LIMABEAN, WHITEBEAN, BEEFIGE, REDBEET, BLUEBERRY, BROCCOLI, CABBAGE, MELON, CARROT, CASEIN, CASHEWNUT, CAULIFLOWER, CELERY     Note: Lab results reviewed.  Recent Diagnostic Imaging Review  Cervical Imaging: Cervical DG Bending/F/E views: Results for orders placed during the hospital encounter of 08/28/21 DG Cervical Spine With Flex & Extend  Narrative CLINICAL DATA:  Neck pain  EXAM: CERVICAL SPINE COMPLETE WITH FLEXION AND EXTENSION VIEWS  COMPARISON:  None.  FINDINGS: No recent fracture is seen. Alignment of posterior margins of vertebral bodies does not change significantly between flexion and extension lateral views. Osteopenia is seen in bony structures. Degenerative changes are noted with bony spurs and disc space narrowing at C6-C7 level. There is mild encroachment of neural foramina at C6-C7 level, more so on the right side. Prevertebral soft tissues are unremarkable.  IMPRESSION: No recent fracture is seen in the cervical spine. Cervical spondylosis with encroachment of neural foramina by bony spurs at C6-C7 level.   Electronically Signed By: Elmer Picker M.D. On: 08/30/2021 15:27  Foot Imaging: Foot-R DG Complete: Results for orders placed in visit on 03/15/20 DG Foot Complete Right  Narrative Please see detailed radiograph report in office note.  Foot-L DG Complete: Results for  orders placed in visit on 03/15/20 DG Foot Complete Left  Narrative Please see detailed radiograph report in office note.  Complexity Note: Imaging results reviewed. Results shared with Ms. Vandehei, using Layman's terms.  Meds   Current Outpatient Medications:    acetaminophen (TYLENOL) 500 MG tablet, Take 500 mg by mouth every 6 (six) hours as needed., Disp: , Rfl:    albuterol (PROVENTIL HFA;VENTOLIN HFA) 108 (90 Base) MCG/ACT inhaler, Inhale 2 puffs into the lungs every 4 (four) hours as needed. , Disp: , Rfl:    Ascorbic Acid (VITAMIN C) 1000 MG tablet, Take 1,000 mg by mouth daily. probably total is 900 mg, Disp: , Rfl:    aspirin 325 MG tablet, Take 325 mg by mouth., Disp: , Rfl:    augmented betamethasone dipropionate (DIPROLENE-AF) 0.05 % cream, APPLY TWICE DAILY AS NEEDED TO THE AFFECTED AREAS FOR 5-7 DAYS FOR ECZEMA., Disp: , Rfl:    b complex vitamins capsule, Take 1 capsule by mouth daily. 1/4 capsule a day, Disp: , Rfl:    BELSOMRA 5 MG TABS, Take 1 tablet by mouth at bedtime as needed. , Disp: , Rfl:    Blood Pressure Monitoring (BLOOD PRESSURE CUFF) MISC, Take blood pressure as needed for symptoms., Disp: , Rfl:    Boswellia Serrata (BOSWELLIA PO), Take 150 mg by mouth every morning. , Disp: , Rfl:    Calcium Citrate 200 MG TABS, Take 600 mg by mouth., Disp: , Rfl:    cromolyn (GASTROCROM) 100 MG/5ML solution, Take by mouth 4 (four) times daily -  before meals and at bedtime., Disp: , Rfl:    Cyanocobalamin (VITAMIN B-12) 1000 MCG SUBL, Place under the tongue., Disp: , Rfl:    desloratadine (CLARINEX) 5 MG tablet, Take 5 mg by mouth as needed., Disp: , Rfl:    diphenhydrAMINE (BENADRYL) 12.5 MG/5ML liquid, Take by mouth 4 (four) times daily as needed., Disp: , Rfl:    diphenhydrAMINE-Allantoin 2-0.5 % CREA, Apply 1 application topically as needed. , Disp: , Rfl:    docusate sodium (COLACE) 100 MG capsule, Take 100 mg by mouth every other day. , Disp: ,  Rfl:    econazole nitrate 1 % cream, APPLY TO FEET TWICE A DAY FOR 2 4 WEEKS AS NEEDED FOR FLARE, Disp: , Rfl:    EPINEPHrine 0.3 mg/0.3 mL IJ SOAJ injection, INJECT ONE AUTO-INJECTOR INTRAMUSCULARLY AS NEEDED, Disp: , Rfl:    estradiol (ESTRACE) 0.1 MG/GM vaginal cream, INSERT 1 GRAM VAGINALLY TWICE WEEKLY, Disp: 42.5 g, Rfl: 2   famotidine (PEPCID) 10 MG tablet, Take 10 mg by mouth daily. , Disp: , Rfl:    fexofenadine (ALLEGRA) 180 MG tablet, Take 180 mg by mouth 2 (two) times daily., Disp: , Rfl:    guaifenesin (HUMIBID E) 400 MG TABS tablet, Take 400 mg by mouth as needed., Disp: , Rfl:    hydrOXYzine (VISTARIL) 25 MG capsule, Take by mouth. 25-50 MG PRN, Disp: , Rfl:    IODINE EX, Apply 225 mcg topically. Every 3 days, Disp: , Rfl:    KETAMINE HCL SL, Place into right nostril as needed., Disp: , Rfl:    ketotifen (ZADITOR) 0.025 % ophthalmic solution, Place 1 drop into both eyes daily as needed., Disp: , Rfl:    Ketotifen Fumarate POWD, 1 mg by Does not apply route. 5-6 times a day, Disp: , Rfl:    L-Lysine 500 MG CAPS, Take by mouth 3 (three) times daily., Disp: , Rfl:    L-Theanine 100 MG CAPS, Take 200 mg by mouth daily. , Disp: , Rfl:    levocetirizine (XYZAL) 2.5 MG/5ML solution, as needed., Disp: , Rfl:    Light Mineral Oil-Mineral Oil 0.5-0.5 % EMUL,  Apply 1 application to eye daily., Disp: , Rfl:    lipase/protease/amylase (CREON) 12000-38000 units CPEP capsule, Take 12,000 Units by mouth as directed., Disp: , Rfl:    loperamide (IMODIUM) 2 MG capsule, Take by mouth as needed for diarrhea or loose stools., Disp: , Rfl:    Magnesium Citrate POWD, Take 133 mg by mouth daily., Disp: , Rfl:    Melatonin 1 MG/ML LIQD, Take by mouth daily. , Disp: , Rfl:    montelukast (SINGULAIR) 5 MG chewable tablet, Chew 5 mg by mouth in the morning and at bedtime., Disp: , Rfl:    Non Gelatin Capsules, Empty, (CAPSULE #3 CLEAR/CLEAR VEG) CAPS, Take 1 capsule by mouth daily., Disp: , Rfl:     NONFORMULARY OR COMPOUNDED ITEM, Bupivacaine nasal 0.5% drops w/o epi, Disp: , Rfl:    omega-3 acid ethyl esters (LOVAZA) 1 g capsule, Take 2 capsules (2 g total) by mouth 2 (two) times daily., Disp: 360 capsule, Rfl: 3   pseudoephedrine (SUDAFED) 30 MG tablet, Take 30 mg by mouth every 4 (four) hours as needed for congestion. , Disp: , Rfl:    pyridostigmine (MESTINON) 60 MG tablet, Take 15 mg by mouth. 3-4 times a day, Disp: , Rfl:    Quercetin 500 MG CAPS, Take by mouth daily., Disp: , Rfl:    RESTASIS 0.05 % ophthalmic emulsion, INSTILL 1 DROP INTO BOTH EYES TWICE A DAY, Disp: 180 mL, Rfl: 1   Riboflavin (VITAMIN B-2 PO), Take 100 mg by mouth 3 (three) times daily., Disp: , Rfl:    Rimegepant Sulfate (NURTEC) 75 MG TBDP, Take 1 tablet by mouth as needed., Disp: , Rfl:    Selenium 200 MCG CAPS, Take 1 capsule by mouth once a week., Disp: , Rfl:    simethicone (MYLICON) 80 MG chewable tablet, Chew 80 mg by mouth every 6 (six) hours as needed for flatulence., Disp: , Rfl:    Sodium Fluoride 1.1 % PSTE, Place onto teeth., Disp: , Rfl:    TRIAMCINOLONE ACETONIDE,NASAL, NA, Place into the nose as needed., Disp: , Rfl:    trolamine salicylate (ASPERCREME/ALOE) 10 % cream, Apply topically., Disp: , Rfl:    UBRELVY 50 MG TABS, TAKE 1 TABLET BY MOUTH ONCE AS NEEDED FOR UP TO 1 DOSE, Disp: , Rfl:    Vitamin D, Cholecalciferol, 10 MCG (400 UNIT) TABS, Take by mouth daily. , Disp: , Rfl:   ROS  Constitutional: Denies any fever or chills Gastrointestinal: No reported hemesis, hematochezia, vomiting, or acute GI distress Musculoskeletal: Denies any acute onset joint swelling, redness, loss of ROM, or weakness Neurological: No reported episodes of acute onset apraxia, aphasia, dysarthria, agnosia, amnesia, paralysis, loss of coordination, or loss of consciousness  Allergies  Ms. Vasallo is allergic to iodinated contrast media, ioversol, ciprofloxacin, eggs or egg-derived products, ginger, levofloxacin,  other, soy allergy, soybean-containing drug products, bee pollen, bupropion, celebrex [celecoxib], codeine, dexilant [dexlansoprazole], dulera [mometasone furo-formoterol fum], fentanyl, fluticasone, gabapentin, gluten meal, histidine, lidocaine, lyrica [pregabalin], mold extract [trichophyton], morphine and related, penicillins, pollen extract, polysorbate [sorbitan], prilosec [omeprazole], rizatriptan, salmeterol xinafoate, silenor [doxepin hcl], stevioside, tomato, xylitol, brewer's dried yeast [yeast], ethanol, hydrocodone, ibuprofen, lac bovis, lanolin, milk-related compounds, morphine, wheat bran, and yeast-related products.  [place]  Drug: Ms. Cortina  reports no history of drug use. Alcohol:  reports no history of alcohol use. Tobacco:  reports that she has quit smoking. Her smoking use included cigarettes. She has never used smokeless tobacco. 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 Surgical: Ms. Rasmusson  has a past surgical history that includes Colon surgery (2008); Endometrial ablation; Appendectomy; Breast surgery; Colonoscopy with propofol (N/A, 10/07/2018); trigeminal stimulator; and Colonoscopy with propofol (N/A, 08/14/2021). Family: family history includes Breast cancer (age of onset: 69) in her maternal aunt; Cataracts in her mother; Colon cancer (age of onset: 30) in her father; Colon cancer (age of onset: 78) in her maternal grandmother; Colon polyps in her brother; Endometrial cancer (age of onset: 21) in her  maternal aunt; Fibromyalgia in her mother; Food Allergy in her maternal grandfather; Glaucoma in her mother; Heart disease in her mother; Leukemia (age of onset: 98) in her maternal grandfather; Migraines in her maternal grandmother and mother; Tremor in her maternal grandmother and mother.  Constitutional Exam  General appearance: Well nourished, well developed, and well hydrated. In no apparent acute distress Vitals:   10/09/21 1141  BP: 121/80  Pulse: 78  Resp: 18  Temp: (!) 97.1 F (36.2 C)  TempSrc: Temporal  SpO2: 100%  Weight: 142 lb (64.4 kg)  Height: 5' 2" (1.575 m)   BMI Assessment: Estimated body mass index is 25.97 kg/m as calculated from the following:   Height as of this encounter: 5' 2" (1.575 m).   Weight as of this encounter: 142 lb (64.4 kg).  BMI interpretation table: BMI level Category Range association with higher incidence of chronic pain  <18 kg/m2 Underweight   18.5-24.9 kg/m2 Ideal body weight   25-29.9 kg/m2 Overweight Increased incidence by 20%  30-34.9 kg/m2 Obese (Class I) Increased incidence by 68%  35-39.9 kg/m2 Severe obesity (Class II) Increased incidence by 136%  >40 kg/m2 Extreme obesity (Class III) Increased incidence by 254%   Patient's current BMI Ideal Body weight  Body mass index is 25.97 kg/m. Ideal body weight: 50.1 kg (110 lb 7.2 oz) Adjusted ideal body weight: 55.8 kg (123 lb 1.1 oz)   BMI Readings from Last 4 Encounters:  10/09/21 25.97 kg/m  09/26/21 26.61 kg/m  08/28/21 25.97 kg/m  08/15/21 26.34 kg/m   Wt Readings from Last 4 Encounters:  10/09/21 142 lb (64.4 kg)  09/26/21 145 lb 8 oz (66 kg)  08/28/21 142 lb (64.4 kg)  08/15/21 144 lb (65.3 kg)    Psych/Mental status: Alert, oriented x 3 (person, place, & time)       Eyes: PERLA Respiratory: No evidence of acute respiratory distress  Assessment & Plan  Primary Diagnosis & Pertinent Problem List: The primary encounter diagnosis was Atypical facial pain (1ry  area of Pain) (Left). Diagnoses of Cervicalgia, Cervical spine disease, Cervical dystonia, Chronic ear pain (Left), Chronic jaw pain (Left), Chronic occipital headache (Midline) (2ry area of Pain), Headache disorder (3ry area of Pain), Chronic neck pain (posterior) (4th area of Pain) (Bilateral) (L>R), and Cervical spondylosis with radiculopathy were also pertinent to this visit.  Visit Diagnosis: 1. Atypical facial pain (1ry area of Pain) (Left)   2. Cervicalgia   3. Cervical spine disease   4. Cervical dystonia   5. Chronic ear pain (Left)   6. Chronic jaw pain (Left)   7. Chronic occipital headache (Midline) (2ry area of Pain)   8. Headache disorder (  3ry area of Pain)   9. Chronic neck pain (posterior) (4th area of Pain) (Bilateral) (L>R)   10. Cervical spondylosis with radiculopathy    Problems updated and reviewed during this visit: Problem  Cervical Spondylosis With Radiculopathy   Cervical spondylosis with encroachment of neural foramina by bony spurs at C6-C7 level.      Plan of Care  Pharmacotherapy (Medications Ordered): No orders of the defined types were placed in this encounter.  Procedure Orders    No procedure(s) ordered today   Lab Orders  No laboratory test(s) ordered today   Imaging Orders  No imaging studies ordered today   Referral Orders  No referral(s) requested today    Pharmacological management options:  Opioid Analgesics: I will not be prescribing any opioids at this time Membrane stabilizer: I will not be prescribing any at this time Muscle relaxant: I will not be prescribing any at this time NSAID: I will not be prescribing any at this time Other analgesic(s): I will not be prescribing any at this time      Interventional Therapies  Risk   Complexity Considerations:   Estimated body mass index is 25.97 kg/m as calculated from the following:   Height as of this encounter: 5' 2" (1.575 m).   Weight as of this encounter: 142 lb (64.4  kg). Multiple allergies: Lidocaine and most opioid analgesics.   Planned   Pending:      Under consideration:   Diagnostic left TMJ Blk #1 (for flareups of her TMJ pain) (without lidocaine) Diagnostic left V2/V3 trigeminal NB #1 (in the event that the TMJ injection does not help) (without lidocaine)   Completed by other practices:   Cervical epidural steroid injections  Cervical facet blocks  Sphenopalatine ganglion blocks  Botox injections  Occipital nerve blocks  Right subacromial bursa injection by Rosalia Hammers, DO (01/12/2019)  Therapeutic right shoulder Large Volume Glenohumeral Joint Injection w/ manipulation by Robet Leu, MD (07/08/2019)  Diagnostic Punch skin biopsy for epidermal nerve fiber density testing by [facility] (06/29/2019)   Completed:   None at this time   Therapeutic   Palliative (PRN) options:   None established    Provider-requested follow-up: Return if symptoms worsen or fail to improve. Recent Visits Date Type Provider Dept  08/28/21 Office Visit Milinda Pointer, MD [facility]  Showing recent visits within past 90 days and meeting all other requirements Today's Visits Date Type Provider Dept  10/09/21 Office Visit Milinda Pointer, MD [facility]  Showing today's visits and meeting all other requirements Future Appointments No visits were found meeting these conditions. Showing future appointments within next 90 days and meeting all other requirements  Primary Care Physician: Virginia Crews, MD Note by: Gaspar Cola, MD Date: 10/09/2021; Time: 12:51 PM

## 2021-10-09 ENCOUNTER — Ambulatory Visit: Payer: Medicare Other | Attending: Pain Medicine | Admitting: Pain Medicine

## 2021-10-09 ENCOUNTER — Other Ambulatory Visit: Payer: Self-pay

## 2021-10-09 VITALS — BP 121/80 | HR 78 | Temp 97.1°F | Resp 18 | Ht 62.0 in | Wt 142.0 lb

## 2021-10-09 DIAGNOSIS — M489 Spondylopathy, unspecified: Secondary | ICD-10-CM

## 2021-10-09 DIAGNOSIS — G8929 Other chronic pain: Secondary | ICD-10-CM

## 2021-10-09 DIAGNOSIS — M4722 Other spondylosis with radiculopathy, cervical region: Secondary | ICD-10-CM | POA: Diagnosis present

## 2021-10-09 DIAGNOSIS — G501 Atypical facial pain: Secondary | ICD-10-CM | POA: Diagnosis present

## 2021-10-09 DIAGNOSIS — R6884 Jaw pain: Secondary | ICD-10-CM

## 2021-10-09 DIAGNOSIS — M542 Cervicalgia: Secondary | ICD-10-CM

## 2021-10-09 DIAGNOSIS — H9202 Otalgia, left ear: Secondary | ICD-10-CM

## 2021-10-09 DIAGNOSIS — G243 Spasmodic torticollis: Secondary | ICD-10-CM | POA: Diagnosis present

## 2021-10-09 DIAGNOSIS — R519 Headache, unspecified: Secondary | ICD-10-CM | POA: Diagnosis present

## 2021-10-09 NOTE — Progress Notes (Signed)
Safety precautions to be maintained throughout the outpatient stay will include: orient to surroundings, keep bed in low position, maintain call bell within reach at all times, provide assistance with transfer out of bed and ambulation.  

## 2021-10-14 ENCOUNTER — Ambulatory Visit: Payer: Medicare Other

## 2021-10-14 ENCOUNTER — Other Ambulatory Visit: Payer: Self-pay

## 2021-10-14 DIAGNOSIS — M26609 Unspecified temporomandibular joint disorder, unspecified side: Secondary | ICD-10-CM

## 2021-10-14 DIAGNOSIS — R6884 Jaw pain: Secondary | ICD-10-CM | POA: Diagnosis not present

## 2021-10-14 DIAGNOSIS — G8929 Other chronic pain: Secondary | ICD-10-CM

## 2021-10-14 DIAGNOSIS — M6281 Muscle weakness (generalized): Secondary | ICD-10-CM

## 2021-10-14 NOTE — Therapy (Signed)
[facility] [address] [place], [address] Phone: [phone]   Fax:  [phone]  Physical Therapy Treatment  Patient Details  Name: Autumn Zhang MRN: [id number] Date of Birth: 05-18-1971 Referring Provider (PT): Rosalia Hammers, DO   Encounter Date: 10/14/2021   PT End of Session - 10/14/21 1150     Visit Number 7    Number of Visits 9    Date for PT Re-Evaluation 10/24/21    PT Start Time 7035    PT Stop Time 1229    PT Time Calculation (min) 44 min    Activity Tolerance Patient limited by pain;Patient limited by fatigue    Behavior During Therapy [facility] for tasks assessed/performed             Past Medical History:  Diagnosis Date   Allergy    Anemia    Anxiety    Arthritis    Asthma    Attention deficit hyperactivity disorder (ADHD), combined type 10/18/2015   Diagnosed many years ago.  Has been doing well on Ritalin $RemoveBe'5mg'[id number]$  QID to six times a day. Long acting hasn't worked well in the past and she has not wanted to try Adderall. She is taking wellbutrin as well, but she has been weaning medication due to SE migraine.  She is now down to Wellbutrin one quarter tablet daily.  Last Assessment & Plan:  A/P: Stable.  Continue current dosing.  Refilled 3 months   Basal cell carcinoma 05/16/2021   Superficial, R ant lower leg. 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[place])    Migraines    Neck pain    Occipital neuralgia    Sleep apnea     Past Surgical History:  Procedure Laterality Date   APPENDECTOMY     removed with colectomy    BREAST SURGERY     reduction   COLON SURGERY  2008   for colon cancer, ~1/3 of colon removed, primary reanastimosis   COLONOSCOPY WITH PROPOFOL N/A 10/07/2018   Procedure: COLONOSCOPY WITH PROPOFOL;  Surgeon: Lin Landsman, MD;  Location: [facility] ENDOSCOPY;  Service: Gastroenterology;  Laterality: N/A;   COLONOSCOPY WITH PROPOFOL N/A 08/14/2021   Procedure: COLONOSCOPY WITH PROPOFOL;  Surgeon: Lin Landsman, MD;  Location: [facility] ENDOSCOPY;  Service: Gastroenterology;  Laterality: N/A;   ENDOMETRIAL ABLATION     trigeminal stimulator      There were no vitals filed for this visit.   Subjective Assessment - 10/14/21 1148     Subjective L jaw at 4/10 NPS. Up to a 8/10 NPS since last PT session. Reports going to oral surgeon and that there is no surgical intervention with regards to L jaw pain that they can perform at this time.    Pertinent History TMJ. Had PT at [facility] which went well. No masks however, and preferes masks due to immune compromise. Tried learning to engage the pterygoid muscle. Has to learn how to use the right muscles due to EDH. 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  L jaw bothers her a lot. R jaw dislocates but can pop it back in. L jaw pops out but the soft tissue is very loose that even at rest, it is subluxed.    Currently in Pain? Yes    Pain Score 4     Pain Location Jaw    Pain Orientation Right;Left    Pain Type Chronic pain             There.exGeryl Councilman tucks: 2x6, 6 sec holds  Seated upper cervical deep neck flexion/nods: 2x6, 6 sec holds  Tongue on roof of palate, gentle mandibular depression (mouth opening), keeping tongue on top of palate: 3x6. Requires finger support on L TMJ to prevent subluxation.  L jaw deviation isometrics: 2x6, 6 sec holds   Scap retractions without resistance, manual support to R shoulder for pt comfort./GHJ stability 2x8, 2-3 sec hold   Review of Postural/shoulder HEP exercises: B shoulder ER with towel  at torso. Pt requiring PT demo, and mod multimodal cuing for improved form/technique with exercise for carryover with targeted musculature.   PT assist and review of squat exercise for form/technique. Education on improving equal weight shift due to significant weight shift onto LLE during descent.      Pt educated on benefits of use of mirror for self awareness and visual cues for HEP due to difficulty with proprioception from EDS.    PT Education - 10/14/21 1150     Education Details form/technique with exercises.    Person(s) Educated Patient    Methods Explanation;Demonstration;Tactile cues;Verbal cues    Comprehension Verbalized understanding;Returned demonstration              PT Short Term Goals - 08/26/21 1325       PT SHORT TERM GOAL #1   Title Patient will be independent with her initial HEP to decrease pain, improve strength, and ability to open and close her mouth more comfortably.    Baseline Pt has started her HEP (08/26/2021)    Time 3    Period Weeks    Status New    Target Date 09/19/21               PT Long Term Goals - 08/26/21 1327       PT LONG TERM GOAL #1   Title Pt will have a decrease in L jaw pain to 3/10 or less at worst to promote ability to open and close her mouth, eat, as well as ambulate more comfortably.    Baseline 9/10 L jaw pain at worst for the past 3 months    Time 8    Period Weeks    Status New    Target Date 10/24/21      PT LONG TERM GOAL #2   Title Pt will have a decrease in R jaw pain to 2/10 or less at worst to promote ability to open and close her mouth, eat, as well as ambulate more comfortably.    Baseline 5/10 R jaw pain at worst for the past 3 months (08/26/2021)    Time 8    Period Weeks    Status New    Target Date 10/24/21      PT LONG TERM GOAL #3   Title Pt will improve her FOTO score by at least 10 points as a demonstration of improved function.    Baseline Orofascial FOTO 40 (pt might have answered  questions based on wearing her jaw strap which helps  decrease pain) 08/26/2021.    Time 8    Period Weeks    Status New    Target Date 10/24/21      PT LONG TERM GOAL #4   Title Pt will report decrease in L jaw pain when ambulating to promote ability to walk longer distances.    Baseline Increased L jaw pain with gait, limiting the distance she ambulates (08/26/2021)    Time 8    Period Weeks    Status New    Target Date 10/24/21                   Plan - 10/14/21 1232     Clinical Impression Statement Pt continuing to report improved ability to ambulate longer distances (up to 35 minutes) with reduction in jaw pain. Continuing primary PT POC with focus on jaw stability postural exercises to improve L jaw stability. End of session with focus on review of HEP with pt requiring min changes to form/technique for improved targeted musculature with improved carryover. Pt educated on use of mirror for visual cuing for form due to poor proprioception from EDS. Pt will continue to benefit from skilled PT services for improved cervical and jaw stability to decrease pain, jaw instability, and dislocations.    Personal Factors and Comorbidities Comorbidity 3+;Time since onset of injury/illness/exacerbation;Fitness;Past/Current Experience    Comorbidities Ehlers-Danlos syndrome type III, dysautonomia, occipital neuralgia, basal cell carcinoma, colon CA, arthritis    Examination-Activity Limitations Self Feeding;Locomotion Level    Stability/Clinical Decision Making Stable/Uncomplicated    Rehab Potential Fair    PT Frequency 1x / week    PT Duration 8 weeks    PT Treatment/Interventions Therapeutic activities;Therapeutic exercise;Neuromuscular re-education;Patient/family education;Manual techniques;Dry needling;Biofeedback;Electrical Stimulation;Iontophoresis 4mg /ml Dexamethasone    PT Next Visit Plan posture, core, scapular, cervical, isometric jaw strengthening, manual techniques, modalities  PRN    PT Home Exercise Plan Medbridge Access Code [email or web address]    Consulted and Agree with Plan of Care Patient             Patient will benefit from skilled therapeutic intervention in order to improve the following deficits and impairments:  Pain, Improper body mechanics, Postural dysfunction, Decreased strength, Difficulty walking  Visit Diagnosis: Chronic jaw pain  Muscle weakness (generalized)  TMJ (temporomandibular joint syndrome)     Problem List Patient Active Problem List   Diagnosis Date Noted   Cervical spondylosis with radiculopathy 10/09/2021   Psoriasis 10/08/2021   Exocrine pancreatic insufficiency 09/26/2021   Urinary frequency 09/26/2021   Atypical facial pain (1ry area of Pain) (Left) 08/29/2021   Chronic ear pain (Left) 08/29/2021   Temporal headache (Left) 08/29/2021   Inner ear pain (Left) 08/29/2021   Trigeminal nerve disorder (Left) 08/29/2021   History of photophobia 08/29/2021   TMJ (temporomandibular joint syndrome) 08/29/2021   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   Polypharmacy 08/29/2021   Severe frontal headaches 08/29/2021   Chronic occipital headache (Midline) (2ry area of Pain) 08/29/2021   Chronic neck pain (posterior) (4th area of Pain) (Bilateral) (L>R) 08/29/2021   Musculoskeletal disorder involving upper trapezius muscle 08/29/2021   Musculoskeletal disorder involving sternocleidomastoid (Left) 08/29/2021   Abnormal MRI, cervical spine (02/11/2019) 08/29/2021   Shoulder stiffness (Right) 08/29/2021   Pharmacologic therapy 08/27/2021   Disorder of skeletal system 08/27/2021   Problems influencing health status 08/27/2021   Anxiety, generalized 07/17/2021   Difficulty walking 07/17/2021   Headache disorder (3ry area of Pain) 07/17/2021   Photophobia 07/17/2021   Speech disturbance 07/17/2021  Tear film insufficiency 07/15/2021   Left lower quadrant abdominal pain 07/05/2021   Palpitations 06/11/2021   Axillary  lymphadenopathy 06/11/2021   Immunosuppressed status ([place]) 04/19/2021   History of seronegative inflammatory arthritis 06/28/2020   Osteopenia of multiple sites 06/14/2020   Psoriatic arthritis ([place]) 06/12/2020   Iodine deficiency 06/12/2020   OSA on CPAP 06/12/2020   Chronic pain syndrome 06/12/2020   Psychophysiological insomnia 11/26/2019   Calcific tendinitis of shoulder (Right) 04/10/2019   History of iron deficiency anemia 11/04/2018   History of basal cell carcinoma 10/07/2018   Vaginal dryness 09/07/2018   Chronic jaw pain (Left) 07/15/2018   Chronic migraine with aura 07/15/2018   Rectocele 05/21/2018   Ehlers-Danlos syndrome 03/15/2018   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[place]) 03/04/2018   Cardiac arrhythmia 01/21/2018   Polyp of colon 01/21/2018   High cholesterol 01/21/2018   Chronic constipation 01/21/2018   Instability of shoulder joint (Right) 01/12/2018   Trigeminal neuralgia (V1-3) (Left) 12/25/2017   Instability of shoulder joint (Left) 12/25/2017   Nausea 12/25/2017   Occipital neuralgia 12/25/2017   Dysautonomia ([place]) 12/25/2017   Hiatal hernia 10/14/2017   TMJ (dislocation of temporomandibular joint) 09/29/2017   Irritable bowel syndrome 07/02/2017   Anal fissure 07/02/2017   Anemia 07/02/2017   Gastroesophageal reflux disease 07/02/2017   Hemorrhoids 07/02/2017   Malignant neoplasm of skin 07/02/2017   Malignant tumor of colon ([place]) 07/02/2017   Arthritis 07/02/2017   Fibromyalgia 07/02/2017   Pulsatile tinnitus of ear (Left) 12/12/2016   Restless leg syndrome 08/01/2016   Cervical dystonia 03/21/2016   Cervical spine disease 03/21/2016   Cervicalgia 02/06/2016   Paresthesia 02/06/2016   Hypercholesterolemia 12/24/2015   Iron deficiency anemia 10/18/2015   Vitamin D deficiency disease 10/18/2015   Midline low back pain without sciatica 02/06/2015   Hyperglycemia 10/30/2014   History of colon cancer  09/27/2014   Postural orthostatic tachycardia syndrome 09/16/2003   Chronic fatigue 09/15/2000   Asthma 09/15/1988   Dyspnea 09/15/1988    Salem Caster. Fairly IV, PT, DPT Physical Therapist- [facility]  10/14/2021, 12:40 PM  [facility] [address], [place], [address] Phone: [phone]   Fax:  [phone]  Name: Autumn Zhang MRN: [id number] Date of Birth: 01-Jun-1971

## 2021-10-15 ENCOUNTER — Ambulatory Visit: Payer: Medicare Other

## 2021-10-15 ENCOUNTER — Encounter: Payer: Self-pay | Admitting: Family Medicine

## 2021-10-15 NOTE — Telephone Encounter (Signed)
Can this be printed and started based on her answers and I will finish later in the week? Thanks!

## 2021-10-17 LAB — SELENIUM SERUM: Selenium, S/P: 195 ug/L (ref 93–198)

## 2021-10-21 NOTE — Progress Notes (Signed)
[facility]  PATIENT: Autumn Zhang DOB: May 18, 1971  REFERRING CLINICIAN: Virginia Crews, MD HISTORY FROM: self and spouse REASON FOR VISIT: facial pain   HISTORICAL  CHIEF COMPLAINT:  Chief Complaint  Patient presents with   Facial Pain    RM  1 with spouse daniel Pt is well, has been having facial pain for over 10 yrs. It has gotten worse over the yrs,she thinks it has been causing her headaches.     HISTORY OF PRESENT ILLNESS:  The patient presents for evaluation of headaches and facial pain which have been present over the past 10 years. She first developed migraines in 2008. 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 She was previously diagnosed with trigeminal neuralgia but has gotten conflicting information from physicians about this diagnosis.   FACIAL PAIN FEATURES  Side: bilateral, left>right Distribution: around left TMJ Any pain on side or back of head: yes Character: throbbing, dull ache Duration: constant Pain-free between episodes: no Triggers: talking, chewing, no sensory triggers Sensory abnormalities: decreased sensation on both sides of the face but left > right Tried Tegretol/Trileptal: none Prior procedures/outcome: none History of dental/oral surgery, facial/plastic surgery: crowns on both sides  Currently she wears a band around her jaw and uses ice to help with her jaw pain. Uses bupivicaine nasal drops and ketamine nasal spray as needed.   States she has constant migraines. They are described as throbbing pain with associated photophobia, phonophobia, and nausea. Has occasional visual auras and constant visual snow.  She has previously followed with multiple neurologists including [facility]. She has tried multiple prior therapies including ketamine nasal spray, nurtec, Botox, amitriptyline, cefaly, magnesium, gabapentin, lyrica, Vimpat, SPG block, occipital nerve block, ubrelvy, Maxalt, Emgality. Has a  long list of allergies and medications she cannot take due to her diagnosis of Mast Cell Activation syndrome.  OTHER MEDICAL CONDITIONS: POTS, IBS, Mast cell activation syndrome, TMJ, Ehler's danlos syndrome   REVIEW OF SYSTEMS: Full 14 system review of systems performed and negative with exception of: facial pain, headaches  ALLERGIES: Allergies  Allergen Reactions   Iodinated Contrast Media Hives, Itching, Palpitations, Photosensitivity and Shortness Of Breath    Other reaction(s): Headache Other reaction(s): Headache   Ioversol Anxiety, Hives, Itching and Palpitations   Ciprofloxacin Other (See Comments)   Eggs Or Egg-Derived Products Other (See Comments)    Exacerbates neuralgia symptoms Exacerbates neuralgia symptoms Congestion & Headaches Exacerbates neuralgia symptoms Congestion & Headaches    Ginger Other (See Comments)    Congestion & Headaches Congestion & Headaches    Levofloxacin Other (See Comments)    Congestion & Headaches Congestion & Headaches    Other Other (See Comments)    Novocaine (IV) form Hearing loss Novocaine (IV) form Hearing loss  Congestion & Headaches Congestion & Headaches Congestion & Headaches Congestion & Headaches  Other reaction(s): Unknown   Soy Allergy     Other reaction(s): Other (See Comments), Unknown Congestion & Headaches Congestion & Headaches   Soybean-Containing Drug Products Other (See Comments)    Congestion & Headaches    Bee Pollen    Bupropion    Celebrex [Celecoxib]    Codeine    Dexilant [Dexlansoprazole]    Dulera [Mometasone Furo-Formoterol Fum]    Fentanyl    Fluticasone    Gabapentin    Gluten Meal    Histidine    Lidocaine     Rash to topical cream   Lyrica [Pregabalin]  Mold Extract [Trichophyton]    Morphine And Related    Penicillins     Skin testing positive for allergy   Pollen Extract    Polysorbate [Sorbitan]    Prilosec [Omeprazole]    Rizatriptan Other (See Comments)     Dizziness, headache, myalgias, nausea   Salmeterol Xinafoate    Silenor [Doxepin Hcl]    Stevioside Other (See Comments)   Tomato    Xylitol Other (See Comments)   Brewer's Dried Yeast [Yeast] Other (See Comments)    Headache, flushing   Ethanol Dermatitis, Other (See Comments), Itching and Palpitations   Hydrocodone Nausea Only   Ibuprofen Nausea And Vomiting    At high doses At high doses    Lac Bovis Diarrhea    Facial burning sensation Facial burning sensation Facial burning sensation Facial burning sensation    Lanolin Other (See Comments)    Exacerbates neuralgia symptoms Wool causes hives.    Milk-Related Compounds Diarrhea    Facial burning sensation    Morphine Nausea Only   Wheat Bran Diarrhea   Yeast-Related Products Other (See Comments)    Headache, flushing    HOME MEDICATIONS: Outpatient Medications Prior to Visit  Medication Sig Dispense Refill   acetaminophen (TYLENOL) 500 MG tablet Take 500 mg by mouth every 6 (six) hours as needed.     albuterol (PROVENTIL HFA;VENTOLIN HFA) 108 (90 Base) MCG/ACT inhaler Inhale 2 puffs into the lungs every 4 (four) hours as needed.      Ascorbic Acid (VITAMIN C) 1000 MG tablet Take 1,000 mg by mouth daily. probably total is 900 mg     aspirin 325 MG tablet Take 325 mg by mouth.     augmented betamethasone dipropionate (DIPROLENE-AF) 0.05 % cream APPLY TWICE DAILY AS NEEDED TO THE AFFECTED AREAS FOR 5-7 DAYS FOR ECZEMA.     b complex vitamins capsule Take 1 capsule by mouth daily. 1/4 capsule a day     BELSOMRA 5 MG TABS Take 1 tablet by mouth at bedtime as needed.      Blood Pressure Monitoring (BLOOD PRESSURE CUFF) MISC Take blood pressure as needed for symptoms.     Boswellia Serrata (BOSWELLIA PO) Take 150 mg by mouth every morning.      Calcium Citrate 200 MG TABS Take 600 mg by mouth.     cromolyn (GASTROCROM) 100 MG/5ML solution Take by mouth 4 (four) times daily -  before meals and at bedtime.     Cyanocobalamin  (VITAMIN B-12) 1000 MCG SUBL Place under the tongue.     desloratadine (CLARINEX) 5 MG tablet Take 5 mg by mouth as needed.     diphenhydrAMINE (BENADRYL) 12.5 MG/5ML liquid Take by mouth 4 (four) times daily as needed.     diphenhydrAMINE-Allantoin 2-0.5 % CREA Apply 1 application topically as needed.      docusate sodium (COLACE) 100 MG capsule Take 100 mg by mouth every other day.      econazole nitrate 1 % cream APPLY TO FEET TWICE A DAY FOR 2 4 WEEKS AS NEEDED FOR FLARE     EPINEPHrine 0.3 mg/0.3 mL IJ SOAJ injection INJECT ONE AUTO-INJECTOR INTRAMUSCULARLY AS NEEDED     estradiol (ESTRACE) 0.1 MG/GM vaginal cream INSERT 1 GRAM VAGINALLY TWICE WEEKLY 42.5 g 2   famotidine (PEPCID) 10 MG tablet Take 10 mg by mouth daily.      fexofenadine (ALLEGRA) 180 MG tablet Take 180 mg by mouth 2 (two) times daily.     guaifenesin (HUMIBID  E) 400 MG TABS tablet Take 400 mg by mouth as needed.     hydrOXYzine (VISTARIL) 25 MG capsule Take by mouth. 25-50 MG PRN     IODINE EX Apply 225 mcg topically. Every 3 days     KETAMINE HCL SL Place into right nostril as needed.     ketotifen (ZADITOR) 0.025 % ophthalmic solution Place 1 drop into both eyes daily as needed.     Ketotifen Fumarate POWD 1 mg by Does not apply route. 5-6 times a day     L-Lysine 500 MG CAPS Take by mouth 3 (three) times daily.     L-Theanine 100 MG CAPS Take 200 mg by mouth daily.      levocetirizine (XYZAL) 2.5 MG/5ML solution as needed.     Light Mineral Oil-Mineral Oil 0.5-0.5 % EMUL Apply 1 application to eye daily.     lipase/protease/amylase (CREON) 12000-38000 units CPEP capsule Take 12,000 Units by mouth as directed.     loperamide (IMODIUM) 2 MG capsule Take by mouth as needed for diarrhea or loose stools.     Magnesium Citrate POWD Take 133 mg by mouth daily.     Melatonin 1 MG/ML LIQD Take by mouth daily.      montelukast (SINGULAIR) 5 MG chewable tablet Chew 5 mg by mouth in the morning and at bedtime.     Non Gelatin  Capsules, Empty, (CAPSULE #3 CLEAR/CLEAR VEG) CAPS Take 1 capsule by mouth daily.     NONFORMULARY OR COMPOUNDED ITEM Bupivacaine nasal 0.5% drops w/o epi     omega-3 acid ethyl esters (LOVAZA) 1 g capsule Take 2 capsules (2 g total) by mouth 2 (two) times daily. 360 capsule 3   pseudoephedrine (SUDAFED) 30 MG tablet Take 30 mg by mouth every 4 (four) hours as needed for congestion.      pyridostigmine (MESTINON) 60 MG tablet Take 15 mg by mouth. 3-4 times a day     Quercetin 500 MG CAPS Take by mouth daily.     RESTASIS 0.05 % ophthalmic emulsion INSTILL 1 DROP INTO BOTH EYES TWICE A DAY 180 mL 1   Riboflavin (VITAMIN B-2 PO) Take 100 mg by mouth 3 (three) times daily.     Rimegepant Sulfate (NURTEC) 75 MG TBDP Take 1 tablet by mouth as needed.     Selenium 200 MCG CAPS Take 1 capsule by mouth once a week.     simethicone (MYLICON) 80 MG chewable tablet Chew 80 mg by mouth every 6 (six) hours as needed for flatulence.     Sodium Fluoride 1.1 % PSTE Place onto teeth.     TRIAMCINOLONE ACETONIDE,NASAL, NA Place into the nose as needed.     trolamine salicylate (ASPERCREME/ALOE) 10 % cream Apply topically.     UBRELVY 50 MG TABS TAKE 1 TABLET BY MOUTH ONCE AS NEEDED FOR UP TO 1 DOSE     Vitamin D, Cholecalciferol, 10 MCG (400 UNIT) TABS Take by mouth daily.      No facility-administered medications prior to visit.    PAST MEDICAL HISTORY: Past Medical History:  Diagnosis Date   Allergy    Anemia    Anxiety    Arthritis    Asthma    Attention deficit hyperactivity disorder (ADHD), combined type 10/18/2015   Diagnosed many years ago.  Has been doing well on Ritalin $RemoveBe'5mg'[id number]$  QID to six times a day. Long acting hasn't worked well in the past and she has not wanted to try Adderall. She is  taking wellbutrin as well, but she has been weaning medication due to SE migraine.  She is now down to Wellbutrin one quarter tablet daily.  Last Assessment & Plan:  A/P: Stable.  Continue current dosing.  Refilled  3 months   Basal cell carcinoma 05/16/2021   Superficial, R ant lower leg. [place], [place]   BRCA negative 09/2018   2015 [place] at [facility]; has VUS; 1/20 MyRisk neg except RPS20 VUS; IBIS=10.8%/riskscore=12%   Colon cancer ([place])    Dysautonomia ([place])    Ehlers-Danlos syndrome type III    Family history of colon cancer    GERD (gastroesophageal reflux disease)    Hyperlipidemia    Idiopathic small fiber peripheral neuropathy    Leiomyoma    Mast cell activation ([facility])    Migraines    Neck pain    Occipital neuralgia    Sleep apnea     PAST SURGICAL HISTORY: Past Surgical History:  Procedure Laterality Date   APPENDECTOMY     removed with colectomy   BREAST SURGERY     reduction   COLON SURGERY  2008   for colon cancer, ~1/3 of colon removed, primary reanastimosis   COLONOSCOPY WITH PROPOFOL N/A 10/07/2018   Procedure: COLONOSCOPY WITH PROPOFOL;  Surgeon: Lin Landsman, MD;  Location: [facility] ENDOSCOPY;  Service: Gastroenterology;  Laterality: N/A;   COLONOSCOPY WITH PROPOFOL N/A 08/14/2021   Procedure: COLONOSCOPY WITH PROPOFOL;  Surgeon: Lin Landsman, MD;  Location: [facility] ENDOSCOPY;  Service: Gastroenterology;  Laterality: N/A;   ENDOMETRIAL ABLATION     trigeminal stimulator      FAMILY HISTORY: Family History  Problem Relation Age of Onset   Colon cancer Father 64   Breast cancer Maternal Aunt 15       BRCA neg   Endometrial cancer Maternal Aunt 50   Colon cancer Maternal Grandmother 76   Migraines Maternal Grandmother    Tremor Maternal Grandmother    Leukemia Maternal Grandfather 32   Food Allergy Maternal Grandfather    Migraines Mother    Glaucoma Mother    Cataracts Mother    Tremor Mother    Fibromyalgia Mother    Heart disease Mother    Colon polyps Brother        non-cancerous    SOCIAL HISTORY: Social History   Socioeconomic History   Marital status: Married    Spouse name: Not on file   Number of children: 0   Years  of education: Not on file   Highest education level: Not on file  Occupational History   Occupation: SSI and disability  Tobacco Use   Smoking status: Former    Types: Cigarettes   Smokeless tobacco: Never   Tobacco comments:    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   Vaping Use: Never used  Substance and Sexual Activity   Alcohol use: Never   Drug use: Never   Sexual activity: Not Currently    Birth control/protection: None  Other Topics Concern   Not on file  Social History Narrative   Not on file   Social Determinants of Health   Financial Resource Strain: Not on file  Food Insecurity: Not on file  Transportation Needs: Not on file  Physical Activity: Not on file  Stress: Not on file  Social Connections: Not on file  Intimate Partner Violence: Not on file     PHYSICAL EXAM  GENERAL EXAM/CONSTITUTIONAL: Vitals:  Vitals:   10/22/21 0918  BP: 127/77  Pulse: 70  Weight: 147 lb (66.7 kg)  Height: 5' (1.524 m)   Body mass index is 28.71 kg/m. Wt Readings from Last 3 Encounters:  10/22/21 147 lb (66.7 kg)  10/09/21 142 lb (64.4 kg)  09/26/21 145 lb 8 oz (66 kg)   Patient is in no distress; well developed, nourished and groomed; neck is supple  CARDIOVASCULAR: Examination of peripheral vascular system by observation and palpation is normal  EYES: Pupils round and reactive to light, Visual fields full to confrontation, Extraocular movements intact   MUSCULOSKELETAL: +Tenderness to palpation over bilateral jaw, occiput, neck, and shoulders  NEUROLOGIC: MENTAL STATUS:  awake, alert, oriented to person, place and time recent and remote memory intact  CRANIAL NERVE:  2nd, 3rd, 4th, 6th - pupils equal and reactive to light, visual fields full to confrontation, extraocular muscles intact, no nystagmus 5th - diminished sensation over left V1-V3 7th - facial strength symmetric 8th - hearing intact 9th - palate elevates symmetrically, uvula midline 11th - shoulder  shrug symmetric 12th - tongue protrusion midline  MOTOR:  normal bulk and tone, full strength in the BUE, BLE  SENSORY:  Diminished sensation LUE, otherwise sensation intact all 4 extremities  COORDINATION:  finger-nose-finger intact  REFLEXES:  deep tendon reflexes present and symmetric  GAIT/STATION:  normal     DIAGNOSTIC DATA (LABS, IMAGING, TESTING) - I reviewed patient records, labs, notes, testing and imaging myself where available.  Lab Results  Component Value Date   WBC 7.3 09/25/2021   HGB 12.2 09/25/2021   HCT 36.8 09/25/2021   MCV 92.9 09/25/2021   PLT 304 09/25/2021      Component Value Date/Time   NA 140 07/02/2021 1355   NA 139 06/18/2021 0711   K 3.8 07/02/2021 1355   CL 106 07/02/2021 1355   CO2 28 07/02/2021 1355   GLUCOSE 103 (H) 07/02/2021 1355   BUN 16 07/02/2021 1355   BUN 11 06/18/2021 0711   CREATININE 0.62 07/02/2021 1355   CALCIUM 9.5 07/02/2021 1355   PROT 7.6 07/02/2021 1355   PROT 6.8 06/18/2021 0711   PROT 7.5 01/04/2020 0000   ALBUMIN 4.3 07/02/2021 1355   ALBUMIN 4.7 06/18/2021 0711   AST 23 07/02/2021 1355   ALT 18 07/02/2021 1355   ALKPHOS 90 07/02/2021 1355   BILITOT 0.7 07/02/2021 1355   BILITOT 0.4 06/18/2021 0711   GFRNONAA >60 07/02/2021 1355   GFRAA 119 06/12/2020 1610   Lab Results  Component Value Date   CHOL 188 06/18/2021   HDL 72 06/18/2021   LDLCALC 105 (H) 06/18/2021   TRIG 59 06/18/2021   CHOLHDL 2.6 06/18/2021   Lab Results  Component Value Date   HGBA1C 5.1 06/18/2021   Lab Results  Component Value Date   AFBXUXYB33 832 09/25/2021   Lab Results  Component Value Date   TSH 1.770 06/18/2021   MRI brain 07/02/21: unremarkable  MRI TMJ 09/28/21: Anterior displacement of the discs on closed sequences with minimal translation of the condyle in open sequences. The left disc appears laterally displaced.   ASSESSMENT AND PLAN  51 y.o. year old female with a history of POTS, IBS, Mast cell  activation syndrome, TMJ, Ehler's danlos syndrome who presents for evaluation of migraines and facial pain. Her facial pain pattern is not consistent with trigeminal neuralgia and is most likely secondary to her TMJ. Discussed preventive migraine medication options including Qulipta, Topamax, and propranolol. She will research these medications and let me know if she would  be interested in trying one. Will refer to Pain Management for management of her ketamine nasal spray.   1. Other chronic pain       PLAN: -Rescue: Continue Ubrelvy 50 mg PRN (she prefers this dose to 100 mg dose). Continue Bupivacaine 0.5% nasal drops (can't use lidocaine or Novocain due to prior reaction) -Referral to Pain Management for management of Ketamine nasal spray -Next steps: consider Topamax, Qulipta, propranolol for migraine prevention   Orders Placed This Encounter  Procedures   Ambulatory referral to Pain Clinic    No orders of the defined types were placed in this encounter.   Return in about 1 year (around 10/22/2022).    Genia Harold, MD 10/22/21 10:13 AM  I spent an average of 47 minutes chart reviewing and counseling the patient, with at least 50% of the time face to face with the patient.   [facility] [address] [place], [place] [address] [phone]

## 2021-10-22 ENCOUNTER — Encounter: Payer: Self-pay | Admitting: Psychiatry

## 2021-10-22 ENCOUNTER — Telehealth: Payer: Self-pay | Admitting: Psychiatry

## 2021-10-22 ENCOUNTER — Ambulatory Visit (INDEPENDENT_AMBULATORY_CARE_PROVIDER_SITE_OTHER): Payer: Medicare Other | Admitting: Psychiatry

## 2021-10-22 VITALS — BP 127/77 | HR 70 | Ht 60.0 in | Wt 147.0 lb

## 2021-10-22 DIAGNOSIS — G43111 Migraine with aura, intractable, with status migrainosus: Secondary | ICD-10-CM

## 2021-10-22 DIAGNOSIS — G8929 Other chronic pain: Secondary | ICD-10-CM

## 2021-10-22 DIAGNOSIS — S0300XA Dislocation of jaw, unspecified side, initial encounter: Secondary | ICD-10-CM

## 2021-10-22 NOTE — Patient Instructions (Addendum)
Consider Qulipta (atogepant) for migraine prevention Continue Ubrelvy and nasal bupivicaine Referral to pain management for ketamine nasal spray

## 2021-10-22 NOTE — Telephone Encounter (Signed)
Sent to [facility] ph # [phone]

## 2021-10-23 ENCOUNTER — Ambulatory Visit: Payer: Medicare Other | Attending: Family Medicine

## 2021-10-23 ENCOUNTER — Other Ambulatory Visit: Payer: Self-pay | Admitting: Psychiatry

## 2021-10-23 ENCOUNTER — Telehealth: Payer: Self-pay | Admitting: *Deleted

## 2021-10-23 ENCOUNTER — Other Ambulatory Visit: Payer: Self-pay

## 2021-10-23 ENCOUNTER — Encounter: Payer: Self-pay | Admitting: Psychiatry

## 2021-10-23 DIAGNOSIS — M26609 Unspecified temporomandibular joint disorder, unspecified side: Secondary | ICD-10-CM

## 2021-10-23 DIAGNOSIS — G8929 Other chronic pain: Secondary | ICD-10-CM

## 2021-10-23 DIAGNOSIS — R6884 Jaw pain: Secondary | ICD-10-CM | POA: Insufficient documentation

## 2021-10-23 DIAGNOSIS — M6281 Muscle weakness (generalized): Secondary | ICD-10-CM

## 2021-10-23 MED ORDER — QULIPTA 10 MG PO TABS: 10.0000 mg | ORAL_TABLET | Freq: Every day | ORAL | 3 refills | Status: DC

## 2021-10-23 NOTE — Telephone Encounter (Signed)
Qulipta PA, key [place], G43.111. Faxed office notes to be attached to key.

## 2021-10-23 NOTE — Therapy (Signed)
[facility] [address] [place], [address] Phone: [phone]   Fax:  [phone]  Physical Therapy Treatment/Recertification  Patient Details  Name: Autumn Zhang MRN: [id number] Date of Birth: 1971/02/21 Referring Provider (PT): Rosalia Hammers, DO   Encounter Date: 10/23/2021   PT End of Session - 10/23/21 0856     Visit Number 8    Number of Visits 17    Date for PT Re-Evaluation 12/18/21    PT Start Time 0801    PT Stop Time 6837    PT Time Calculation (min) 43 min    Activity Tolerance Patient limited by pain;Patient limited by fatigue    Behavior During Therapy [facility] for tasks assessed/performed             Past Medical History:  Diagnosis Date   Allergy    Anemia    Anxiety    Arthritis    Asthma    Attention deficit hyperactivity disorder (ADHD), combined type 10/18/2015   Diagnosed many years ago.  Has been doing well on Ritalin $RemoveBe'5mg'[id number]$  QID to six times a day. Long acting hasn't worked well in the past and she has not wanted to try Adderall. She is taking wellbutrin as well, but she has been weaning medication due to SE migraine.  She is now down to Wellbutrin one quarter tablet daily.  Last Assessment & Plan:  A/P: Stable.  Continue current dosing.  Refilled 3 months   Basal cell carcinoma 05/16/2021   Superficial, R ant lower leg. 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[place])    Migraines    Neck pain    Occipital neuralgia    Sleep apnea     Past Surgical History:  Procedure Laterality Date   APPENDECTOMY     removed  with colectomy   BREAST SURGERY     reduction   COLON SURGERY  2008   for colon cancer, ~1/3 of colon removed, primary reanastimosis   COLONOSCOPY WITH PROPOFOL N/A 10/07/2018   Procedure: COLONOSCOPY WITH PROPOFOL;  Surgeon: Lin Landsman, MD;  Location: [facility] ENDOSCOPY;  Service: Gastroenterology;  Laterality: N/A;   COLONOSCOPY WITH PROPOFOL N/A 08/14/2021   Procedure: COLONOSCOPY WITH PROPOFOL;  Surgeon: Lin Landsman, MD;  Location: [facility] ENDOSCOPY;  Service: Gastroenterology;  Laterality: N/A;   ENDOMETRIAL ABLATION     trigeminal stimulator      There were no vitals filed for this visit.   Subjective Assessment - 10/23/21 0803     Subjective Pt reports L jaw pain is ISQ. Pt reports seeing new neurologist and her health care team on board that L jaw pain appears the primary problem with migraines and other issues. Pt remaining active, tolerating walking further distances. L pain currently at 4/10 NPS.    Pertinent History TMJ. Had PT at [facility] which went well. No masks however, and preferes masks due to immune compromise. Tried learning to engage the pterygoid muscle. Has to learn how to use the right muscles due to EDH. 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 L jaw bothers her a lot. R jaw dislocates but can pop it back in. L jaw pops out but the soft tissue is very loose that even at rest, it is subluxed.    Currently in Pain? Yes    Pain Score 4             There.ex:   Reassessment of short and long term goals. Please see clinical impression and long term goals for progress. Pt accomplished short term goal and is making good progress towards all long term goals towards improving pain and function with L TMJ pain. Remainder of session with focus on education for hip stability and strength to reduce jarring of L TMJ with functional mobility and walking tasks to make further progress towards pt goals.    Squat with towel b/t legs as tactile cue to maintain  LE's in neutral position to prevent knee valgus for improved glut med activation: 2x10. Good form/technique with improved, equal WB'ing through LE's however still favoring L >R.    Use of mirror as visual cue with LLE propped on airex pad to improve RLE weightbearing with great carryover. 1x10    Standing hip abduction with BUE support: 2x10, Decreased stance time on LLE due to RLE weakness with decreased abduction AROM compared to L.     Seated clamshell isometric for deep hip external rotators: x5, 5 sec holds   PT Education - 10/23/21 2458     Education Details POC, form/technique with exercise.    Person(s) Educated Patient    Methods Explanation;Demonstration;Tactile cues;Verbal cues    Comprehension Verbalized understanding;Returned demonstration              PT Short Term Goals - 10/23/21 0998       PT SHORT TERM GOAL #1   Title Patient will be independent with her initial HEP to decrease pain, improve strength, and ability to open and close her mouth more comfortably.    Baseline Pt has started her HEP (08/26/2021); 10/23/21: is independent, has decreased pain by 2-3 points.    Time 3    Period Weeks    Status Achieved    Target Date 10/23/21               PT Long Term Goals - 10/23/21 0809       PT LONG TERM GOAL #1   Title Pt will have a decrease in L jaw pain to 3/10 or less at worst to promote ability to open and close her mouth, eat, as well as ambulate more comfortably.    Baseline 9/10 L jaw pain at worst for the past 3 months; 10/23/21: 6/10 NPS    Time 8    Period Weeks    Status On-going    Target Date 12/18/21      PT LONG TERM GOAL #2   Title Pt will have a decrease in R jaw pain to 2/10 or less at worst to promote ability to open and close her mouth, eat, as well as ambulate more comfortably.    Baseline 5/10 R jaw pain at worst for the past 3 months (08/26/2021); 10/23/21: 2.5-3/10 NPS.    Time 8    Period Weeks    Status On-going    Target Date  12/18/21      PT LONG TERM GOAL #3   Title Pt will improve her FOTO score by at least 10 points as a demonstration of improved function.  Baseline Orofascial FOTO 40 (pt might have answered questions based on wearing her jaw strap which helps decrease pain) 08/26/2021.; 10/23/21: 43    Time 8    Period Weeks    Status On-going    Target Date 12/18/21      PT LONG TERM GOAL #4   Title Pt will report decrease in L jaw pain when ambulating to promote ability to walk longer distances.    Baseline Increased L jaw pain with gait, limiting the distance she ambulates (08/26/2021); 10/23/21: Initially 9/10 with ambulation, is now reported down to a 6/10 NPS.    Time 8    Period Weeks    Status Achieved    Target Date 10/24/21      PT LONG TERM GOAL #5   Title Pt will report ability to ambulate at least 60 minutes with </= 4/10 NPS to maintain pain within tolerable levels for pt to stay active with recreational activity.    Baseline 10/23/21: 6/10 NPS L jaw pain tolerating 35 minutes    Time 8    Period Weeks    Status New    Target Date 12/18/21                   Plan - 10/23/21 0857     Clinical Impression Statement Pt making steady progress in PT with regards to L TMJ pain. Pt tolerating increased tolerance for ambulation with reduced jarring to L jaw due to chronic instability. Although pt has only achieved her short term goal, Pt has made progress towards all long term goals. New goal added with focus on L jaw pain and timed ambulation to maintain recreational activity to pt tolerance. Remainder of session with focus on glut max and med activation for improved hip strength/stability in attempts to reduce jarring of L jaw pain with functional activity and recreational tasks. Pt will benefit from an additional 8 weeks of treatment to achieve long term goals.    Personal Factors and Comorbidities Comorbidity 3+;Time since onset of injury/illness/exacerbation;Fitness;Past/Current  Experience    Comorbidities Ehlers-Danlos syndrome type III, dysautonomia, occipital neuralgia, basal cell carcinoma, colon CA, arthritis    Examination-Activity Limitations Self Feeding;Locomotion Level    Stability/Clinical Decision Making Stable/Uncomplicated    Clinical Decision Making Low    Rehab Potential Fair    PT Frequency 1x / week    PT Duration 8 weeks    PT Treatment/Interventions Therapeutic activities;Therapeutic exercise;Neuromuscular re-education;Patient/family education;Manual techniques;Dry needling;Biofeedback;Electrical Stimulation;Iontophoresis 4mg /ml Dexamethasone    PT Next Visit Plan posture, core, scapular, cervical, isometric jaw strengthening, manual techniques, modalities PRN    PT Home Exercise Plan Medbridge Access Code [email or web address]    Consulted and Agree with Plan of Care Patient             Patient will benefit from skilled therapeutic intervention in order to improve the following deficits and impairments:  Pain, Improper body mechanics, Postural dysfunction, Decreased strength, Difficulty walking  Visit Diagnosis: Chronic jaw pain  Muscle weakness (generalized)  TMJ (temporomandibular joint syndrome)     Problem List Patient Active Problem List   Diagnosis Date Noted   Cervical spondylosis with radiculopathy 10/09/2021   Psoriasis 10/08/2021   Exocrine pancreatic insufficiency 09/26/2021   Urinary frequency 09/26/2021   Atypical facial pain (1ry area of Pain) (Left) 08/29/2021   Chronic ear pain (Left) 08/29/2021   Temporal headache (Left) 08/29/2021   Inner ear pain (Left) 08/29/2021   Trigeminal nerve disorder (Left) 08/29/2021   History of  photophobia 08/29/2021   TMJ (temporomandibular joint syndrome) 08/29/2021   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   Polypharmacy 08/29/2021   Severe frontal headaches 08/29/2021   Chronic occipital headache (Midline) (2ry area of Pain) 08/29/2021   Chronic neck pain (posterior) (4th area of  Pain) (Bilateral) (L>R) 08/29/2021   Musculoskeletal disorder involving upper trapezius muscle 08/29/2021   Musculoskeletal disorder involving sternocleidomastoid (Left) 08/29/2021   Abnormal MRI, cervical spine (02/11/2019) 08/29/2021   Shoulder stiffness (Right) 08/29/2021   Pharmacologic therapy 08/27/2021   Disorder of skeletal system 08/27/2021   Problems influencing health status 08/27/2021   Anxiety, generalized 07/17/2021   Difficulty walking 07/17/2021   Headache disorder (3ry area of Pain) 07/17/2021   Photophobia 07/17/2021   Speech disturbance 07/17/2021   Tear film insufficiency 07/15/2021   Left lower quadrant abdominal pain 07/05/2021   Palpitations 06/11/2021   Axillary lymphadenopathy 06/11/2021   Immunosuppressed status ([place]) 04/19/2021   History of seronegative inflammatory arthritis 06/28/2020   Osteopenia of multiple sites 06/14/2020   Psoriatic arthritis ([place]) 06/12/2020   Iodine deficiency 06/12/2020   OSA on CPAP 06/12/2020   Chronic pain syndrome 06/12/2020   Psychophysiological insomnia 11/26/2019   Calcific tendinitis of shoulder (Right) 04/10/2019   History of iron deficiency anemia 11/04/2018   History of basal cell carcinoma 10/07/2018   Vaginal dryness 09/07/2018   Chronic jaw pain (Left) 07/15/2018   Chronic migraine with aura 07/15/2018   Rectocele 05/21/2018   Ehlers-Danlos syndrome 03/15/2018   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   Cardiac arrhythmia 01/21/2018   Polyp of colon 01/21/2018   High cholesterol 01/21/2018   Chronic constipation 01/21/2018   Instability of shoulder joint (Right) 01/12/2018   Trigeminal neuralgia (V1-3) (Left) 12/25/2017   Instability of shoulder joint (Left) 12/25/2017   Nausea 12/25/2017   Occipital neuralgia 12/25/2017   Dysautonomia ([place]) 12/25/2017   Hiatal hernia 10/14/2017   TMJ (dislocation of temporomandibular joint) 09/29/2017   Irritable bowel  syndrome 07/02/2017   Anal fissure 07/02/2017   Anemia 07/02/2017   Gastroesophageal reflux disease 07/02/2017   Hemorrhoids 07/02/2017   Malignant neoplasm of skin 07/02/2017   Malignant tumor of colon ([place]) 07/02/2017   Arthritis 07/02/2017   Fibromyalgia 07/02/2017   Pulsatile tinnitus of ear (Left) 12/12/2016   Restless leg syndrome 08/01/2016   Cervical dystonia 03/21/2016   Cervical spine disease 03/21/2016   Cervicalgia 02/06/2016   Paresthesia 02/06/2016   Hypercholesterolemia 12/24/2015   Iron deficiency anemia 10/18/2015   Vitamin D deficiency disease 10/18/2015   Midline low back pain without sciatica 02/06/2015   Hyperglycemia 10/30/2014   History of colon cancer 09/27/2014   Postural orthostatic tachycardia syndrome 09/16/2003   Chronic fatigue 09/15/2000   Asthma 09/15/1988   Dyspnea 09/15/1988    Salem Caster. Fairly IV, PT, DPT Physical Therapist- [facility]  10/23/2021, 9:43 AM  [facility] [address] [place], [address] Phone: [phone]   Fax:  [phone]  Name: Autumn Zhang MRN: [id number] Date of Birth: 12-01-1970

## 2021-10-23 NOTE — Telephone Encounter (Signed)
Received e mail that office notes are attached to key.  Your information has been submitted to [place].  If [employer] has not responded to your request within 24 hours, contact [place] at [phone]. Sent my chart with update and gave her the savings web site and phone # in the vent it is denied.

## 2021-10-23 NOTE — Telephone Encounter (Signed)
Qulipta denied. The policy states that this medication may be approved when: -The member is unable to take the required number of formulary alternatives for the given diagnosis due to an intolerance or contraindication OR -The member has tried and failed the required number of formulary alternative.  Based on the policy and the information we received your request is denied. We did not receive documentation that you meet the criteria outlined above. Formulary alternatives are: Nurtec ODT, Ajovy, Emgality. (Requirement: 3 in a class with 3 or more alternatives, 2 in a class with 2 alternatives, or 1 in a class with only 1 alternative.) Sent to MD

## 2021-10-24 ENCOUNTER — Encounter: Payer: Self-pay | Admitting: Psychiatry

## 2021-10-24 ENCOUNTER — Encounter: Payer: Self-pay | Admitting: *Deleted

## 2021-10-24 NOTE — Telephone Encounter (Signed)
Received fax from [employer] re: urgent appeal attestation. Placed on MD desk for completion.

## 2021-10-24 NOTE — Telephone Encounter (Signed)
We will appeal the denial of qulipta based on patient's reports of her allergies. Patient signed the authorized representative designation form for claims, sent it via my chart. Letter of urgent appeal on Dr Georgina Peer desk for review, signature.

## 2021-10-24 NOTE — Telephone Encounter (Signed)
I'll send her a message and see if she wants to try Ajovy. She's already tried [employer] which have been ineffective

## 2021-10-24 NOTE — Telephone Encounter (Signed)
Letter signed, and authorized rep designation form, letter of appeal, office note,  my chart messages and patient's communication with her allergist faxed to Prescription claims appeal dept of [employer]. Received confirmation.

## 2021-10-24 NOTE — Telephone Encounter (Signed)
Dr. Hardin Negus does not prescribe Ketamine nasal spray. Per Kenney Houseman at [facility] management    Sent to [facility] ph # [phone].

## 2021-10-24 NOTE — Telephone Encounter (Signed)
Called patient and advised Autumn Zhang that Autumn Zhang insurance requires Autumn Zhang to consent for Dr Billey Gosling to appeal qulipta denial. She will get form online and send via my chart.

## 2021-10-25 NOTE — Telephone Encounter (Signed)
[facility] Autumn Zhang) will not be able accept this patient due to do not treat what is wrong with her and do not prescribe Ketamine nasal spray.

## 2021-10-28 ENCOUNTER — Encounter: Payer: Self-pay | Admitting: Psychiatry

## 2021-10-28 ENCOUNTER — Ambulatory Visit: Payer: Medicare Other

## 2021-10-28 ENCOUNTER — Other Ambulatory Visit: Payer: Self-pay

## 2021-10-28 DIAGNOSIS — R6884 Jaw pain: Secondary | ICD-10-CM | POA: Diagnosis not present

## 2021-10-28 DIAGNOSIS — G8929 Other chronic pain: Secondary | ICD-10-CM

## 2021-10-28 DIAGNOSIS — M6281 Muscle weakness (generalized): Secondary | ICD-10-CM

## 2021-10-28 DIAGNOSIS — M26609 Unspecified temporomandibular joint disorder, unspecified side: Secondary | ICD-10-CM

## 2021-10-28 NOTE — Telephone Encounter (Signed)
Noted, thank you

## 2021-10-28 NOTE — Telephone Encounter (Signed)
Per Dr Billey Gosling this is not an urgent request. Letter revised and faxed to [place]. Received confirmation.

## 2021-10-28 NOTE — Therapy (Signed)
[facility] [address] [place], [address] Phone: [phone]   Fax:  [phone]  Physical Therapy Treatment  Patient Details  Name: Autumn Zhang MRN: [id number] Date of Birth: 1971-08-04 Referring Provider (PT): Rosalia Hammers, DO   Encounter Date: 10/28/2021   PT End of Session - 10/28/21 1018     Visit Number 9    Number of Visits 17    Date for PT Re-Evaluation 12/18/21    PT Start Time 1019    PT Stop Time 0211    PT Time Calculation (min) 34 min    Activity Tolerance Patient limited by pain;Patient limited by fatigue    Behavior During Therapy [facility] for tasks assessed/performed             Past Medical History:  Diagnosis Date   Allergy    Anemia    Anxiety    Arthritis    Asthma    Attention deficit hyperactivity disorder (ADHD), combined type 10/18/2015   Diagnosed many years ago.  Has been doing well on Ritalin $RemoveBe'5mg'tFHfgYNhO$  QID to six times a day. Long acting hasn't worked well in the past and she has not wanted to try Adderall. She is taking wellbutrin as well, but she has been weaning medication due to SE migraine.  She is now down to Wellbutrin one quarter tablet daily.  Last Assessment & Plan:  A/P: Stable.  Continue current dosing.  Refilled 3 months   Basal cell carcinoma 05/16/2021   Superficial, R ant lower leg. 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[place])    Migraines    Neck pain    Occipital neuralgia    Sleep apnea     Past Surgical History:  Procedure Laterality Date   APPENDECTOMY     removed with colectomy    BREAST SURGERY     reduction   COLON SURGERY  2008   for colon cancer, ~1/3 of colon removed, primary reanastimosis   COLONOSCOPY WITH PROPOFOL N/A 10/07/2018   Procedure: COLONOSCOPY WITH PROPOFOL;  Surgeon: Lin Landsman, MD;  Location: [facility] ENDOSCOPY;  Service: Gastroenterology;  Laterality: N/A;   COLONOSCOPY WITH PROPOFOL N/A 08/14/2021   Procedure: COLONOSCOPY WITH PROPOFOL;  Surgeon: Lin Landsman, MD;  Location: [facility] ENDOSCOPY;  Service: Gastroenterology;  Laterality: N/A;   ENDOMETRIAL ABLATION     trigeminal stimulator      There were no vitals filed for this visit.   Subjective Assessment - 10/28/21 1020     Subjective Jaw is doing ok. Today, the pain is about a 4/10. The exercise when she bites on the stick is still difficult to engage the proper muscles. Walking has been good. Was able to walk 50 minutes at one times. Feels tired.    Pertinent History TMJ. Had PT at [facility] which went well. No masks however, and preferes masks due to immune compromise. Tried learning to engage the pterygoid muscle. Has to learn how to use the right muscles due to EDH. 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 L jaw bothers her a lot. R jaw dislocates but can pop it back in. L jaw pops out but the soft tissue is very loose that even at rest, it is subluxed.    Currently in Pain? Yes    Pain Score 4                                         PT Education - 10/28/21 1321     Education Details ther-ex, HEP    Person(s) Educated Patient    Methods Explanation;Demonstration;Tactile cues;Verbal cues;Handout    Comprehension Returned demonstration;Verbalized understanding                 Medbridge Access Code [email or web address]   (R jaw deviation movement per pt)        There.ex:     Recumbent bike per pt request seat 12, cues for femoral control and proper hip position  Decreased patellar pain with cues to decrease knee valgus  Seated hip  adduction folded pillow squeeze 10x2 with 5 second holds to promote medial patellar glide.  Seated B scapular retraction 10x2 with 5 second holds   Reclined isometric cervical nod with pressing tongue at roof of mouth. 10x5 seconds for 2 sets  Reclined B scapular retraction with transversus abdominis contractoin 10x5 seconds for 2 sets  Pt was recommended to perform the biting on the tongue depressors with back teeth in the reclined position instead of being upright to see if it makes a difference. Pt verbalized understanding.   Reclined gentle manually resisted L jaw isometrics in netural to decrease R deviation 5x5 seconds. Fatiged.    Pt educated on benefits of use of mirror for self awareness and visual cues for HEP due to difficulty with proprioception from EDS.   Response to treatment/Clinical impression Fair tolerance to todays's session.  Pt continues to improve gait distance prior to jaw bothering her. Worked on scapular and anterior cervical muscle activation to promote a stronger foundation for her jaw muscles to function. Demonstrates weak jaw strength and endurance with gentle manual resistance to counter R jaw deviation when opening her mouth. Fatigues after 5 repetitions. Pt will benefit from continued skilled physical therapy services to continue to improve jaw strength and neutral jaw movements, as well as cervical and scapular strengthening, and core strength to help decrease jaw instability and dislocations.       PT Short Term Goals - 10/23/21 3532       PT SHORT TERM GOAL #1   Title Patient will be independent with her initial HEP to decrease pain, improve strength, and ability to open and close her mouth more comfortably.    Baseline Pt has started her HEP (08/26/2021); 10/23/21: is independent, has decreased pain by 2-3 points.    Time 3    Period Weeks    Status Achieved    Target Date 10/23/21               PT Long Term Goals - 10/23/21 0809       PT  LONG TERM GOAL #1   Title Pt will have a decrease in L jaw pain to 3/10 or less at worst to promote ability to open and close her mouth, eat, as well as ambulate more comfortably.    Baseline 9/10 L jaw pain at worst for the past 3 months; 10/23/21: 6/10 NPS  Time 8    Period Weeks    Status On-going    Target Date 12/18/21      PT LONG TERM GOAL #2   Title Pt will have a decrease in R jaw pain to 2/10 or less at worst to promote ability to open and close her mouth, eat, as well as ambulate more comfortably.    Baseline 5/10 R jaw pain at worst for the past 3 months (08/26/2021); 10/23/21: 2.5-3/10 NPS.    Time 8    Period Weeks    Status On-going    Target Date 12/18/21      PT LONG TERM GOAL #3   Title Pt will improve her FOTO score by at least 10 points as a demonstration of improved function.    Baseline Orofascial FOTO 40 (pt might have answered questions based on wearing her jaw strap which helps decrease pain) 08/26/2021.; 10/23/21: 43    Time 8    Period Weeks    Status On-going    Target Date 12/18/21      PT LONG TERM GOAL #4   Title Pt will report decrease in L jaw pain when ambulating to promote ability to walk longer distances.    Baseline Increased L jaw pain with gait, limiting the distance she ambulates (08/26/2021); 10/23/21: Initially 9/10 with ambulation, is now reported down to a 6/10 NPS.    Time 8    Period Weeks    Status Achieved    Target Date 10/24/21      PT LONG TERM GOAL #5   Title Pt will report ability to ambulate at least 60 minutes with </= 4/10 NPS to maintain pain within tolerable levels for pt to stay active with recreational activity.    Baseline 10/23/21: 6/10 NPS L jaw pain tolerating 35 minutes    Time 8    Period Weeks    Status New    Target Date 12/18/21                   Plan - 10/28/21 1321     Clinical Impression Statement Fair tolerance to todays's session.  Pt continues to improve gait distance prior to jaw bothering her.  Worked on scapular and anterior cervical muscle activation to promote a stronger foundation for her jaw muscles to function. Demonstrates weak jaw strength and endurance with gentle manual resistance to counter R jaw deviation when opening her mouth. Fatigues after 5 repetitions. Pt will benefit from continued skilled physical therapy services to continue to improve jaw strength and neutral jaw movements, as well as cervical and scapular strengthening, and core strength to help decrease jaw instability and dislocations.    Personal Factors and Comorbidities Comorbidity 3+;Time since onset of injury/illness/exacerbation;Fitness;Past/Current Experience    Comorbidities Ehlers-Danlos syndrome type III, dysautonomia, occipital neuralgia, basal cell carcinoma, colon CA, arthritis    Examination-Activity Limitations Self Feeding;Locomotion Level    Stability/Clinical Decision Making Stable/Uncomplicated    Rehab Potential Fair    PT Frequency 1x / week    PT Duration 8 weeks    PT Treatment/Interventions Therapeutic activities;Therapeutic exercise;Neuromuscular re-education;Patient/family education;Manual techniques;Dry needling;Biofeedback;Electrical Stimulation;Iontophoresis 4mg /ml Dexamethasone    PT Next Visit Plan posture, core, scapular, cervical, isometric jaw strengthening, manual techniques, modalities PRN    PT Home Exercise Plan Medbridge Access Code [email or web address]    Consulted and Agree with Plan of Care Patient             Patient will benefit from skilled therapeutic intervention in  order to improve the following deficits and impairments:  Pain, Improper body mechanics, Postural dysfunction, Decreased strength, Difficulty walking  Visit Diagnosis: Chronic jaw pain  Muscle weakness (generalized)  TMJ (temporomandibular joint syndrome)     Problem List Patient Active Problem List   Diagnosis Date Noted   Cervical spondylosis with radiculopathy 10/09/2021   Psoriasis 10/08/2021    Exocrine pancreatic insufficiency 09/26/2021   Urinary frequency 09/26/2021   Atypical facial pain (1ry area of Pain) (Left) 08/29/2021   Chronic ear pain (Left) 08/29/2021   Temporal headache (Left) 08/29/2021   Inner ear pain (Left) 08/29/2021   Trigeminal nerve disorder (Left) 08/29/2021   History of photophobia 08/29/2021   TMJ (temporomandibular joint syndrome) 08/29/2021   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   Polypharmacy 08/29/2021   Severe frontal headaches 08/29/2021   Chronic occipital headache (Midline) (2ry area of Pain) 08/29/2021   Chronic neck pain (posterior) (4th area of Pain) (Bilateral) (L>R) 08/29/2021   Musculoskeletal disorder involving upper trapezius muscle 08/29/2021   Musculoskeletal disorder involving sternocleidomastoid (Left) 08/29/2021   Abnormal MRI, cervical spine (02/11/2019) 08/29/2021   Shoulder stiffness (Right) 08/29/2021   Pharmacologic therapy 08/27/2021   Disorder of skeletal system 08/27/2021   Problems influencing health status 08/27/2021   Anxiety, generalized 07/17/2021   Difficulty walking 07/17/2021   Headache disorder (3ry area of Pain) 07/17/2021   Photophobia 07/17/2021   Speech disturbance 07/17/2021   Tear film insufficiency 07/15/2021   Left lower quadrant abdominal pain 07/05/2021   Palpitations 06/11/2021   Axillary lymphadenopathy 06/11/2021   Immunosuppressed status (HCC) 04/19/2021   History of seronegative inflammatory arthritis 06/28/2020   Osteopenia of multiple sites 06/14/2020   Psoriatic arthritis (HCC) 06/12/2020   Iodine deficiency 06/12/2020   OSA on CPAP 06/12/2020   Chronic pain syndrome 06/12/2020   Psychophysiological insomnia 11/26/2019   Calcific tendinitis of shoulder (Right) 04/10/2019   History of iron deficiency anemia 11/04/2018   History of basal cell carcinoma 10/07/2018   Vaginal dryness 09/07/2018   Chronic jaw pain (Left) 07/15/2018   Chronic migraine with aura 07/15/2018   Rectocele  05/21/2018   Ehlers-Danlos syndrome 03/15/2018   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   Cardiac arrhythmia 01/21/2018   Polyp of colon 01/21/2018   High cholesterol 01/21/2018   Chronic constipation 01/21/2018   Instability of shoulder joint (Right) 01/12/2018   Trigeminal neuralgia (V1-3) (Left) 12/25/2017   Instability of shoulder joint (Left) 12/25/2017   Nausea 12/25/2017   Occipital neuralgia 12/25/2017   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   Hiatal hernia 10/14/2017   TMJ (dislocation of temporomandibular joint) 09/29/2017   Irritable bowel syndrome 07/02/2017   Anal fissure 07/02/2017   Anemia 07/02/2017   Gastroesophageal reflux disease 07/02/2017   Hemorrhoids 07/02/2017   Malignant neoplasm of skin 07/02/2017   Malignant tumor of colon (HCC) 07/02/2017   Arthritis 07/02/2017   Fibromyalgia 07/02/2017   Pulsatile tinnitus of ear (Left) 12/12/2016   Restless leg syndrome 08/01/2016   Cervical dystonia 03/21/2016   Cervical spine disease 03/21/2016   Cervicalgia 02/06/2016   Paresthesia 02/06/2016   Hypercholesterolemia 12/24/2015   Iron deficiency anemia 10/18/2015   Vitamin D deficiency disease 10/18/2015   Midline low back pain without sciatica 02/06/2015   Hyperglycemia 10/30/2014   History of colon cancer 09/27/2014   Postural orthostatic tachycardia syndrome 09/16/2003   Chronic fatigue 09/15/2000   Asthma 09/15/1988   Dyspnea 09/15/1988   Loralyn Freshwater PT, DPT  10/28/2021, 1:23 PM  [facility] [address] [place], [address] Phone: [phone]   Fax:  [phone]  Name: BELA BONAPARTE MRN: [id number] Date of Birth: [birth year]

## 2021-10-28 NOTE — Patient Instructions (Signed)
Access Code: [email or web address] URL: [email or web address] Date: 10/28/2021 Prepared by: Joneen Boers  Exercises Standing Gluteal Sets - 1 x daily - 7 x weekly - 3 sets - 10 reps - 5 seconds hold Standing Transverse Abdominis Contraction - 1 x daily - 7 x weekly - 3 sets - 10 reps - 5 seconds hold Seated Cervical Retraction - 1 x daily - 7 x weekly - 3 sets - 10 reps - 5 seconds hold Supine Transversus Abdominis Bracing - Hands on Stomach - 1-3 x daily - 7 x weekly - 1 sets - 1 reps - 1 to 1.5 minutes hold Seated Hip Adduction Isometrics with Ball - 1 x daily - 7 x weekly - 1-3 sets - 10 reps - 5 seconds hold

## 2021-10-28 NOTE — Telephone Encounter (Signed)
I sent her a message, she will look for a local Ketamine clinic. Thanks

## 2021-11-04 NOTE — Telephone Encounter (Signed)
Per my chart from patient Autumn Zhang is approved.

## 2021-11-05 ENCOUNTER — Ambulatory Visit: Payer: Medicare Other

## 2021-11-05 ENCOUNTER — Other Ambulatory Visit: Payer: Self-pay

## 2021-11-05 DIAGNOSIS — M6281 Muscle weakness (generalized): Secondary | ICD-10-CM

## 2021-11-05 DIAGNOSIS — R6884 Jaw pain: Secondary | ICD-10-CM | POA: Diagnosis not present

## 2021-11-05 DIAGNOSIS — G8929 Other chronic pain: Secondary | ICD-10-CM

## 2021-11-05 DIAGNOSIS — M26609 Unspecified temporomandibular joint disorder, unspecified side: Secondary | ICD-10-CM

## 2021-11-05 NOTE — Therapy (Signed)
[facility] [address], [place], [address] Phone: [phone]   Fax:  [phone]  Physical Therapy Treatment And Progress Report (08/26/2022 - 11/05/2021)  Patient Details  Name: Autumn Zhang MRN: [id number] Date of Birth: 1971/03/09 Referring Provider (PT): Rosalia Hammers, DO   Encounter Date: 11/05/2021   PT End of Session - 11/05/21 0808     Visit Number 10    Number of Visits 17    Date for PT Re-Evaluation 12/18/21    PT Start Time 0808    PT Stop Time 0843    PT Time Calculation (min) 35 min    Activity Tolerance Patient limited by pain;Patient limited by fatigue    Behavior During Therapy [facility] for tasks assessed/performed             Past Medical History:  Diagnosis Date   Allergy    Anemia    Anxiety    Arthritis    Asthma    Attention deficit hyperactivity disorder (ADHD), combined type 10/18/2015   Diagnosed many years ago.  Has been doing well on Ritalin $RemoveBe'5mg'EjYErMHNE$  QID to six times a day. Long acting hasn't worked well in the past and she has not wanted to try Adderall. She is taking wellbutrin as well, but she has been weaning medication due to SE migraine.  She is now down to Wellbutrin one quarter tablet daily.  Last Assessment & Plan:  A/P: Stable.  Continue current dosing.  Refilled 3 months   Basal cell carcinoma 05/16/2021   Superficial, R ant lower leg. 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[place])    Migraines    Neck pain    Occipital neuralgia    Sleep apnea     Past Surgical History:  Procedure Laterality  Date   APPENDECTOMY     removed with colectomy   BREAST SURGERY     reduction   COLON SURGERY  2008   for colon cancer, ~1/3 of colon removed, primary reanastimosis   COLONOSCOPY WITH PROPOFOL N/A 10/07/2018   Procedure: COLONOSCOPY WITH PROPOFOL;  Surgeon: Lin Landsman, MD;  Location: [facility] ENDOSCOPY;  Service: Gastroenterology;  Laterality: N/A;   COLONOSCOPY WITH PROPOFOL N/A 08/14/2021   Procedure: COLONOSCOPY WITH PROPOFOL;  Surgeon: Lin Landsman, MD;  Location: [facility] ENDOSCOPY;  Service: Gastroenterology;  Laterality: N/A;   ENDOMETRIAL ABLATION     trigeminal stimulator      There were no vitals filed for this visit.   Subjective Assessment - 11/05/21 0809     Subjective Doing ok. Jaw is about the same, 4/10 currently. Jaw was not great the rest of the day after last session, back to normal the next day. Forgot to do the tongue depressor exercise on her recliner. Both feet bother her currently.    Pertinent History TMJ. Had PT at [facility] which went well. No masks however, and preferes masks due to immune compromise. Tried learning to engage the pterygoid muscle. Has to learn how to use the right muscles due to EDH. 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 L jaw bothers her a lot. R jaw dislocates but can pop it back in. L jaw pops out but the soft tissue is very loose that even at rest, it is subluxed.    Currently in Pain? Yes    Pain Score 4     Pain Location Jaw                                        PT Education - 11/05/21 0945     Education Details ther-ex    Person(s) Educated Patient    Methods Explanation;Demonstration;Tactile cues;Verbal cues    Comprehension Returned demonstration;Verbalized understanding           Medbridge Access Code [email or web address]   (R jaw deviation movement per pt)         There-ex:   Reclined position  Gentle posterior jaw clenching against tongue depressor, about 10x5 seconds.  Fatigued   Transversus abdominis contraction 10x5 seconds for 3 sets   Scapular retraction 10x5 seconds for 3 sets   Glute max squeeze 10x5 seconds   gentle manually resisted L jaw isometrics in netural to decrease R deviation 5x5 seconds. Fatiged. Difficult to perform.    Improved exercise technique, movement at target joints, use of target muscles after mod verbal, visual, tactile cues.      Response to treatment/Clinical impression Pt demonstrates overall decreased L and R jaw pain as well as improved ability to ambulate longer distances with less jaw symptoms, especially with addition of a strap to help stabilize her jaw. Continued working on improving glute, trunk, scapular, and jaw strength to promote stability and control of jaw movement. Fair tolerance to today's session. 30-35 minute session secondary to muscle fatigue. Pt will benefit from continued skilled physical therapy services to continue to improve jaw strength and neutral jaw movements, as well as cervical and scapular strengthening, and core strength to help decrease jaw instability and dislocations.      PT Short Term Goals - 10/23/21 8546       PT SHORT TERM GOAL #1   Title Patient will be independent with her initial HEP to decrease pain, improve strength, and ability to open and close her mouth more comfortably.    Baseline Pt has started her HEP (08/26/2021); 10/23/21: is independent, has decreased pain by 2-3 points.    Time 3    Period Weeks    Status Achieved    Target Date 10/23/21               PT Long Term Goals - 11/05/21 0811       PT LONG TERM GOAL #1   Title Pt will have a decrease in L jaw pain to 3/10 or less at worst to promote ability to open and close her mouth, eat, as well as ambulate more comfortably.    Baseline 9/10 L jaw pain at worst for the past 3 months; 10/23/21: 6/10 NPS; 6/10 at most for the past 7 days (11/05/2021)    Time 8    Period Weeks    Status On-going    Target  Date 12/18/21      PT LONG TERM GOAL #2   Title Pt will have a decrease in R jaw pain to 2/10 or less at worst to promote ability to open and close her mouth, eat, as well as  ambulate more comfortably.    Baseline 5/10 R jaw pain at worst for the past 3 months (08/26/2021); 10/23/21: 2.5-3/10 NPS. 3/10 at most for the past 7 days (11/05/2021)    Time 8    Period Weeks    Status On-going    Target Date 12/18/21      PT LONG TERM GOAL #3   Title Pt will improve her FOTO score by at least 10 points as a demonstration of improved function.    Baseline Orofascial FOTO 40 (pt might have answered questions based on wearing her jaw strap which helps decrease pain) 08/26/2021.; 10/23/21: 43    Time 8    Period Weeks    Status On-going    Target Date 12/18/21      PT LONG TERM GOAL #4   Title Pt will report decrease in L jaw pain when ambulating to promote ability to walk longer distances.    Baseline Increased L jaw pain with gait, limiting the distance she ambulates (08/26/2021); 10/23/21: Initially 9/10 with ambulation, is now reported down to a 6/10 NPS. Walking is getting better, jaw feels more stable and better able to engage the muscles better, the additional band also helps (11/05/2021)    Time 8    Period Weeks    Status Achieved    Target Date 10/24/21      PT LONG TERM GOAL #5   Title Pt will report ability to ambulate at least 60 minutes with </= 4/10 NPS to maintain pain within tolerable levels for pt to stay active with recreational activity.    Baseline 10/23/21: 6/10 NPS L jaw pain tolerating 35 minutes; 50 minutes at the longest, about 5/10 jaw pain (11/05/2021)    Time 8    Period Weeks    Status Partially Met    Target Date 12/18/21                   Plan - 11/05/21 0948     Clinical Impression Statement Pt demonstrates overall decreased L and R jaw pain as well as improved ability to ambulate longer distances with less jaw symptoms, especially with addition of a strap to  help stabilize her jaw. Continued working on improving glute, trunk, scapular, and jaw strength to promote stability and control of jaw movement. Fair tolerance to today's session. 30-35 minute session secondary to muscle fatigue. Pt will benefit from continued skilled physical therapy services to continue to improve jaw strength and neutral jaw movements, as well as cervical and scapular strengthening, and core strength to help decrease jaw instability and dislocations.    Personal Factors and Comorbidities Comorbidity 3+;Time since onset of injury/illness/exacerbation;Fitness;Past/Current Experience    Comorbidities Ehlers-Danlos syndrome type III, dysautonomia, occipital neuralgia, basal cell carcinoma, colon CA, arthritis    Examination-Activity Limitations Self Feeding;Locomotion Level    Stability/Clinical Decision Making Stable/Uncomplicated    Clinical Decision Making Low    Rehab Potential Fair    PT Frequency 1x / week    PT Duration 8 weeks    PT Treatment/Interventions Therapeutic activities;Therapeutic exercise;Neuromuscular re-education;Patient/family education;Manual techniques;Dry needling;Biofeedback;Electrical Stimulation;Iontophoresis 4mg /ml Dexamethasone    PT Next Visit Plan posture, core, scapular, cervical, isometric jaw strengthening, manual techniques, modalities PRN    PT Home Exercise Plan Medbridge Access Code [email or web address]    Consulted and Agree with Plan of Care Patient             Patient will benefit from skilled therapeutic intervention in order to improve the following deficits  and impairments:  Pain, Improper body mechanics, Postural dysfunction, Decreased strength, Difficulty walking  Visit Diagnosis: Chronic jaw pain  Muscle weakness (generalized)  TMJ (temporomandibular joint syndrome)     Problem List Patient Active Problem List   Diagnosis Date Noted   Cervical spondylosis with radiculopathy 10/09/2021   Psoriasis 10/08/2021   Exocrine  pancreatic insufficiency 09/26/2021   Urinary frequency 09/26/2021   Atypical facial pain (1ry area of Pain) (Left) 08/29/2021   Chronic ear pain (Left) 08/29/2021   Temporal headache (Left) 08/29/2021   Inner ear pain (Left) 08/29/2021   Trigeminal nerve disorder (Left) 08/29/2021   History of photophobia 08/29/2021   TMJ (temporomandibular joint syndrome) 08/29/2021   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   Polypharmacy 08/29/2021   Severe frontal headaches 08/29/2021   Chronic occipital headache (Midline) (2ry area of Pain) 08/29/2021   Chronic neck pain (posterior) (4th area of Pain) (Bilateral) (L>R) 08/29/2021   Musculoskeletal disorder involving upper trapezius muscle 08/29/2021   Musculoskeletal disorder involving sternocleidomastoid (Left) 08/29/2021   Abnormal MRI, cervical spine (02/11/2019) 08/29/2021   Shoulder stiffness (Right) 08/29/2021   Pharmacologic therapy 08/27/2021   Disorder of skeletal system 08/27/2021   Problems influencing health status 08/27/2021   Anxiety, generalized 07/17/2021   Difficulty walking 07/17/2021   Headache disorder (3ry area of Pain) 07/17/2021   Photophobia 07/17/2021   Speech disturbance 07/17/2021   Tear film insufficiency 07/15/2021   Left lower quadrant abdominal pain 07/05/2021   Palpitations 06/11/2021   Axillary lymphadenopathy 06/11/2021   Immunosuppressed status (HCC) 04/19/2021   History of seronegative inflammatory arthritis 06/28/2020   Osteopenia of multiple sites 06/14/2020   Psoriatic arthritis (HCC) 06/12/2020   Iodine deficiency 06/12/2020   OSA on CPAP 06/12/2020   Chronic pain syndrome 06/12/2020   Psychophysiological insomnia 11/26/2019   Calcific tendinitis of shoulder (Right) 04/10/2019   History of iron deficiency anemia 11/04/2018   History of basal cell carcinoma 10/07/2018   Vaginal dryness 09/07/2018   Chronic jaw pain (Left) 07/15/2018   Chronic migraine with aura 07/15/2018   Rectocele  05/21/2018   Ehlers-Danlos syndrome 03/15/2018   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   Cardiac arrhythmia 01/21/2018   Polyp of colon 01/21/2018   High cholesterol 01/21/2018   Chronic constipation 01/21/2018   Instability of shoulder joint (Right) 01/12/2018   Trigeminal neuralgia (V1-3) (Left) 12/25/2017   Instability of shoulder joint (Left) 12/25/2017   Nausea 12/25/2017   Occipital neuralgia 12/25/2017   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   Hiatal hernia 10/14/2017   TMJ (dislocation of temporomandibular joint) 09/29/2017   Irritable bowel syndrome 07/02/2017   Anal fissure 07/02/2017   Anemia 07/02/2017   Gastroesophageal reflux disease 07/02/2017   Hemorrhoids 07/02/2017   Malignant neoplasm of skin 07/02/2017   Malignant tumor of colon (HCC) 07/02/2017   Arthritis 07/02/2017   Fibromyalgia 07/02/2017   Pulsatile tinnitus of ear (Left) 12/12/2016   Restless leg syndrome 08/01/2016   Cervical dystonia 03/21/2016   Cervical spine disease 03/21/2016   Cervicalgia 02/06/2016   Paresthesia 02/06/2016   Hypercholesterolemia 12/24/2015   Iron deficiency anemia 10/18/2015   Vitamin D deficiency disease 10/18/2015   Midline low back pain without sciatica 02/06/2015   Hyperglycemia 10/30/2014   History of colon cancer 09/27/2014   Postural orthostatic tachycardia syndrome 09/16/2003   Chronic fatigue 09/15/2000   Asthma 09/15/1988   Dyspnea 09/15/1988   Thank you for your referral.  Loralyn Freshwater PT, DPT  11/05/2021, 9:55 AM  [facility] [address] [place], [address] Phone: [phone]   Fax:  [phone]  Name: SUZANNE KHO MRN: [id number] Date of Birth: 07/12/1971

## 2021-11-12 ENCOUNTER — Ambulatory Visit: Payer: Medicare Other

## 2021-11-12 ENCOUNTER — Other Ambulatory Visit: Payer: Self-pay

## 2021-11-12 DIAGNOSIS — G8929 Other chronic pain: Secondary | ICD-10-CM

## 2021-11-12 DIAGNOSIS — M26609 Unspecified temporomandibular joint disorder, unspecified side: Secondary | ICD-10-CM

## 2021-11-12 DIAGNOSIS — R6884 Jaw pain: Secondary | ICD-10-CM

## 2021-11-12 DIAGNOSIS — M6281 Muscle weakness (generalized): Secondary | ICD-10-CM

## 2021-11-12 NOTE — Therapy (Signed)
[facility] [address], [place], [address] Phone: [phone]   Fax:  [phone]  Physical Therapy Treatment  Patient Details  Name: Autumn Zhang MRN: [id number] Date of Birth: 11-Feb-1971 Referring Provider (PT): Rosalia Hammers, DO   Encounter Date: 11/12/2021   PT End of Session - 11/12/21 0806     Visit Number 11    Number of Visits 17    Date for PT Re-Evaluation 12/18/21    PT Start Time 0806    PT Stop Time 0846    PT Time Calculation (min) 40 min    Activity Tolerance Patient limited by pain;Patient limited by fatigue    Behavior During Therapy [facility] for tasks assessed/performed             Past Medical History:  Diagnosis Date   Allergy    Anemia    Anxiety    Arthritis    Asthma    Attention deficit hyperactivity disorder (ADHD), combined type 10/18/2015   Diagnosed many years ago.  Has been doing well on Ritalin 53m QID to six times a day. Long acting hasn't worked well in the past and she has not wanted to try Adderall. She is taking wellbutrin as well, but she has been weaning medication due to SE migraine.  She is now down to Wellbutrin one quarter tablet daily.  Last Assessment & Plan:  A/P: Stable.  Continue current dosing.  Refilled 3 months   Basal cell carcinoma 05/16/2021   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[place]    Migraines    Neck pain    Occipital neuralgia    Sleep apnea     Past Surgical History:  Procedure Laterality Date   APPENDECTOMY     removed with colectomy    BREAST SURGERY     reduction   COLON SURGERY  2008   for colon cancer, ~1/3 of colon removed, primary reanastimosis   COLONOSCOPY WITH PROPOFOL N/A 10/07/2018   Procedure: COLONOSCOPY WITH PROPOFOL;  Surgeon: VLin Landsman MD;  Location: [facility] ENDOSCOPY;  Service: Gastroenterology;  Laterality: N/A;   COLONOSCOPY WITH PROPOFOL N/A 08/14/2021   Procedure: COLONOSCOPY WITH PROPOFOL;  Surgeon: VLin Landsman MD;  [facility];  Service: Gastroenterology;  Laterality: N/A;   ENDOMETRIAL ABLATION     trigeminal stimulator      There were no vitals filed for this visit.   Subjective Assessment - 11/12/21 0807     Subjective Jaw is the same. 3/10 L jaw currently. Jaw was aggravated after last session the rest of the day. Jaw was back to normal the next day and has been normal since then, about 3-5/10 average pain.  5-6/10 at most for the past 7 days.    Pertinent History TMJ. Had PT at [facility] which went well. No masks however, and preferes masks due to immune compromise. Tried learning to engage the pterygoid muscle. Has to learn how to use the right muscles due to EDH. Has an EMT  muscle trainer which helped her a lot. L jaw bothers her a lot. R jaw dislocates but can pop it back in. L jaw pops out but the soft tissue is very loose that even at rest, it is subluxed.    Currently in Pain? Yes    Pain Score 3                                         PT Education - 11/12/21 1251     Education Details ther-ex    Person(s) Educated Patient    Methods Explanation;Demonstration;Tactile cues;Verbal cues    Comprehension Returned demonstration;Verbalized understanding               Medbridge Access Code [email or web address]   (R jaw deviation movement per pt)    Adhesive allergy.      There-ex:    Reclined position                       Scapular retraction 10x5 seconds for 3 sets   gentle manually resisted L jaw isometrics in netural to  decrease R deviation 5x5 seconds. Fatiged. Difficult to perform.       Gentle posterior jaw clenching against tongue depressor, about 10x5 seconds. Fatigued               Transversus abdominis contraction 10x5 seconds for 3 sets  Utilized latex free bands to help assist to promote L jaw movement to counter R jaw deviation and anterior jaw deviation.   Bands given to pt to experiment with it at home to see if it helps her activate appropriate muscles.         Improved exercise technique, movement at target joints, use of target muscles after mod verbal, visual, tactile cues.        Response to treatment/Clinical impression Fair tolerance to today's session. Demonstrates difficulty with exercise tolerance secondary to EDS. Worked on decreasing anterior and R lateral jaw deviation as well as promoting scapular and trunk strength to promote better mechanics at her TMJ. Pt will benefit from continued skilled physical therapy services to continue to improve jaw strength and neutral jaw movements, as well as cervical and scapular strengthening, and core strength to help decrease jaw instability and dislocations.     PT Short Term Goals - 10/23/21 8119       PT SHORT TERM GOAL #1   Title Patient will be independent with her initial HEP to decrease pain, improve strength, and ability to open and close her mouth more comfortably.    Baseline Pt has started her HEP (08/26/2021); 10/23/21: is independent, has decreased pain by 2-3 points.    Time 3    Period Weeks    Status Achieved    Target Date 10/23/21               PT Long Term Goals - 11/05/21 0811       PT LONG TERM GOAL #1   Title Pt will have a decrease in L jaw pain to 3/10 or less at worst to promote ability to open and close her mouth, eat, as well as ambulate more comfortably.    Baseline 9/10 L jaw pain at worst for the past 3 months; 10/23/21: 6/10 NPS; 6/10 at most for the past 7 days (11/05/2021)    Time 8    Period  Weeks  Status On-going    Target Date 12/18/21      PT LONG TERM GOAL #2   Title Pt will have a decrease in R jaw pain to 2/10 or less at worst to promote ability to open and close her mouth, eat, as well as ambulate more comfortably.    Baseline 5/10 R jaw pain at worst for the past 3 months (08/26/2021); 10/23/21: 2.5-3/10 NPS. 3/10 at most for the past 7 days (11/05/2021)    Time 8    Period Weeks    Status On-going    Target Date 12/18/21      PT LONG TERM GOAL #3   Title Pt will improve her FOTO score by at least 10 points as a demonstration of improved function.    Baseline Orofascial FOTO 40 (pt might have answered questions based on wearing her jaw strap which helps decrease pain) 08/26/2021.; 10/23/21: 43    Time 8    Period Weeks    Status On-going    Target Date 12/18/21      PT LONG TERM GOAL #4   Title Pt will report decrease in L jaw pain when ambulating to promote ability to walk longer distances.    Baseline Increased L jaw pain with gait, limiting the distance she ambulates (08/26/2021); 10/23/21: Initially 9/10 with ambulation, is now reported down to a 6/10 NPS. Walking is getting better, jaw feels more stable and better able to engage the muscles better, the additional band also helps (11/05/2021)    Time 8    Period Weeks    Status Achieved    Target Date 10/24/21      PT LONG TERM GOAL #5   Title Pt will report ability to ambulate at least 60 minutes with </= 4/10 NPS to maintain pain within tolerable levels for pt to stay active with recreational activity.    Baseline 10/23/21: 6/10 NPS L jaw pain tolerating 35 minutes; 50 minutes at the longest, about 5/10 jaw pain (11/05/2021)    Time 8    Period Weeks    Status Partially Met    Target Date 12/18/21                   Plan - 11/12/21 1251     Clinical Impression Statement Fair tolerance to today's session. Demonstrates difficulty with exercise tolerance secondary to EDS. Worked on decreasing anterior  and R lateral jaw deviation as well as promoting scapular and trunk strength to promote better mechanics at her TMJ. Pt will benefit from continued skilled physical therapy services to continue to improve jaw strength and neutral jaw movements, as well as cervical and scapular strengthening, and core strength to help decrease jaw instability and dislocations.    Personal Factors and Comorbidities Comorbidity 3+;Time since onset of injury/illness/exacerbation;Fitness;Past/Current Experience    Comorbidities Ehlers-Danlos syndrome type III, dysautonomia, occipital neuralgia, basal cell carcinoma, colon CA, arthritis    Examination-Activity Limitations Self Feeding;Locomotion Level    Stability/Clinical Decision Making Stable/Uncomplicated    Rehab Potential Fair    PT Frequency 1x / week    PT Duration 8 weeks    PT Treatment/Interventions Therapeutic activities;Therapeutic exercise;Neuromuscular re-education;Patient/family education;Manual techniques;Dry needling;Biofeedback;Electrical Stimulation;Iontophoresis 80m/ml Dexamethasone    PT Next Visit Plan posture, core, scapular, cervical, isometric jaw strengthening, manual techniques, modalities PRN    PT Home Exercise Plan Medbridge Access Code [email or web address]   Consulted and Agree with Plan of Care Patient  Patient will benefit from skilled therapeutic intervention in order to improve the following deficits and impairments:  Pain, Improper body mechanics, Postural dysfunction, Decreased strength, Difficulty walking  Visit Diagnosis: Chronic jaw pain  Muscle weakness (generalized)  TMJ (temporomandibular joint syndrome)     Problem List Patient Active Problem List   Diagnosis Date Noted   Cervical spondylosis with radiculopathy 10/09/2021   Psoriasis 10/08/2021   Exocrine pancreatic insufficiency 09/26/2021   Urinary frequency 09/26/2021   Atypical facial pain (1ry area of Pain) (Left) 08/29/2021   Chronic ear pain (Left)  08/29/2021   Temporal headache (Left) 08/29/2021   Inner ear pain (Left) 08/29/2021   Trigeminal nerve disorder (Left) 08/29/2021   History of photophobia 08/29/2021   TMJ (temporomandibular joint syndrome) 08/29/2021   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   Polypharmacy 08/29/2021   Severe frontal headaches 08/29/2021   Chronic occipital headache (Midline) (2ry area of Pain) 08/29/2021   Chronic neck pain (posterior) (4th area of Pain) (Bilateral) (L>R) 08/29/2021   Musculoskeletal disorder involving upper trapezius muscle 08/29/2021   Musculoskeletal disorder involving sternocleidomastoid (Left) 08/29/2021   Abnormal MRI, cervical spine (02/11/2019) 08/29/2021   Shoulder stiffness (Right) 08/29/2021   Pharmacologic therapy 08/27/2021   Disorder of skeletal system 08/27/2021   Problems influencing health status 08/27/2021   Anxiety, generalized 07/17/2021   Difficulty walking 07/17/2021   Headache disorder (3ry area of Pain) 07/17/2021   Photophobia 07/17/2021   Speech disturbance 07/17/2021   Tear film insufficiency 07/15/2021   Left lower quadrant abdominal pain 07/05/2021   Palpitations 06/11/2021   Axillary lymphadenopathy 06/11/2021   Immunosuppressed status ([employer]) 04/19/2021   History of seronegative inflammatory arthritis 06/28/2020   Osteopenia of multiple sites 06/14/2020   Psoriatic arthritis ([place]) 06/12/2020   Iodine deficiency 06/12/2020   OSA on CPAP 06/12/2020   Chronic pain syndrome 06/12/2020   Psychophysiological insomnia 11/26/2019   Calcific tendinitis of shoulder (Right) 04/10/2019   History of iron deficiency anemia 11/04/2018   History of basal cell carcinoma 10/07/2018   Vaginal dryness 09/07/2018   Chronic jaw pain (Left) 07/15/2018   Chronic migraine with aura 07/15/2018   Rectocele 05/21/2018   Ehlers-Danlos syndrome 03/15/2018   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   Cardiac arrhythmia  01/21/2018   Polyp of colon 01/21/2018   High cholesterol 01/21/2018   Chronic constipation 01/21/2018   Instability of shoulder joint (Right) 01/12/2018   Trigeminal neuralgia (V1-3) (Left) 12/25/2017   Instability of shoulder joint (Left) 12/25/2017   Nausea 12/25/2017   Occipital neuralgia 12/25/2017   Dysautonomia ([place]) 12/25/2017   Hiatal hernia 10/14/2017   TMJ (dislocation of temporomandibular joint) 09/29/2017   Irritable bowel syndrome 07/02/2017   Anal fissure 07/02/2017   Anemia 07/02/2017   Gastroesophageal reflux disease 07/02/2017   Hemorrhoids 07/02/2017   Malignant neoplasm of skin 07/02/2017   Malignant tumor of colon ([place]) 07/02/2017   Arthritis 07/02/2017   Fibromyalgia 07/02/2017   Pulsatile tinnitus of ear (Left) 12/12/2016   Restless leg syndrome 08/01/2016   Cervical dystonia 03/21/2016   Cervical spine disease 03/21/2016   Cervicalgia 02/06/2016   Paresthesia 02/06/2016   Hypercholesterolemia 12/24/2015   Iron deficiency anemia 10/18/2015   Vitamin D deficiency disease 10/18/2015   Midline low back pain without sciatica 02/06/2015   Hyperglycemia 10/30/2014   History of colon cancer 09/27/2014   Postural orthostatic tachycardia syndrome 09/16/2003   Chronic fatigue 09/15/2000   Asthma 09/15/1988   Dyspnea 09/15/1988  Joneen Boers PT, DPT   11/12/2021, 12:59 PM  [facility] [address], [place], [address] Phone: [phone]   Fax:  [phone]  Name: TAJI SATHER MRN: [id number] Date of Birth: 01-13-1971

## 2021-11-12 NOTE — Patient Instructions (Signed)
Utilized latex free bands to help assist to promote L jaw movement to counter R jaw deviation and anterior jaw deviation.   Bands given to pt to experiment with it at home to see if it helps her activate appropriate muscles.

## 2021-11-14 ENCOUNTER — Encounter: Payer: Self-pay | Admitting: Psychiatry

## 2021-11-14 NOTE — Telephone Encounter (Signed)
Referral sent to [facility] [phone] 

## 2021-11-21 ENCOUNTER — Ambulatory Visit: Payer: Medicare Other

## 2021-11-21 ENCOUNTER — Encounter: Payer: Self-pay | Admitting: Obstetrics and Gynecology

## 2021-11-21 DIAGNOSIS — N898 Other specified noninflammatory disorders of vagina: Secondary | ICD-10-CM

## 2021-11-21 MED ORDER — ESTRADIOL 0.1 MG/GM VA CREA: TOPICAL_CREAM | VAGINAL | 1 refills | Status: DC

## 2021-11-25 NOTE — Telephone Encounter (Signed)
Referral sent to [facility] [phone]. ?

## 2021-11-28 ENCOUNTER — Other Ambulatory Visit: Payer: Self-pay

## 2021-11-28 ENCOUNTER — Ambulatory Visit: Payer: Medicare Other | Attending: Family Medicine

## 2021-11-28 DIAGNOSIS — R6884 Jaw pain: Secondary | ICD-10-CM | POA: Insufficient documentation

## 2021-11-28 DIAGNOSIS — G8929 Other chronic pain: Secondary | ICD-10-CM | POA: Diagnosis present

## 2021-11-28 DIAGNOSIS — M6281 Muscle weakness (generalized): Secondary | ICD-10-CM | POA: Diagnosis present

## 2021-11-28 DIAGNOSIS — M26609 Unspecified temporomandibular joint disorder, unspecified side: Secondary | ICD-10-CM | POA: Diagnosis present

## 2021-11-28 NOTE — Therapy (Signed)
[facility] ?[address] ?[place], [place], [address] ?Phone: [phone]   Fax:  [phone] ? ?Physical Therapy Treatment ?And ?Discharge Summary ? ?Patient Details  ?Name: Autumn Zhang ?MRN: [id number] ?Date of Birth: [birth year] ?Referring Provider (PT): Rosalia Hammers, DO ? ? ?Encounter Date: 11/28/2021 ? ? PT End of Session - 11/28/21 0848   ? ? Visit Number 12   ? Number of Visits 17   ? Date for PT Re-Evaluation 12/18/21   ? PT Start Time [phone]   ? PT Stop Time 0941   ? PT Time Calculation (min) 52 min   ? Activity Tolerance Patient limited by pain;Patient limited by fatigue   ? Behavior During Therapy [facility] for tasks assessed/performed   ? ?  ?  ? ?  ? ? ?Past Medical History:  ?Diagnosis Date  ? Allergy   ? Anemia   ? Anxiety   ? Arthritis   ? Asthma   ? Attention deficit hyperactivity disorder (ADHD), combined type 10/18/2015  ? Diagnosed many years ago.  Has been doing well on Ritalin $RemoveBe'5mg'[id number]$  QID to six times a day. Long acting hasn't worked well in the past and she has not wanted to try Adderall. She is taking wellbutrin as well, but she has been weaning medication due to SE migraine.  She is now down to Wellbutrin one quarter tablet daily.  Last Assessment & Plan:  A/P: Stable.  Continue current dosing.  Refilled ?3 months  ? Basal cell carcinoma 05/16/2021  ? Superficial, R ant lower leg. [place], [place]  ? BRCA negative 09/2018  ? 2015 [facility] at [facility]; has VUS; 1/20 MyRisk neg except RPS20 VUS; IBIS=10.8%/riskscore=12%  ? Colon cancer ([place])   ? Dysautonomia ([place])   ? Ehlers-Danlos syndrome type III   ? Family history of colon cancer   ? GERD (gastroesophageal reflux disease)   ? Hyperlipidemia   ? Idiopathic small fiber peripheral neuropathy   ? Leiomyoma   ? 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 Migraines   ? Neck pain   ? Occipital neuralgia   ? Sleep apnea   ? ? ?Past Surgical History:  ?Procedure Laterality Date  ? APPENDECTOMY    ?  removed with colectomy  ? BREAST SURGERY    ? reduction  ? COLON SURGERY  2008  ? for colon cancer, ~1/3 of colon removed, primary reanastimosis  ? COLONOSCOPY WITH PROPOFOL N/A 10/07/2018  ? Procedure: COLONOSCOPY WITH PROPOFOL;  Surgeon: Lin Landsman, MD;  Location[facility]COPY;  Service: Gastroenterology;  Laterality: N/A;  ? COLONOSCOPY WITH PROPOFOL N/A 08/14/2021  ? Procedure: COLONOSCOPY WITH PROPOFOL;  Surgeon: Lin Landsman, MD;  Location: [facility] ENDOSCOPY;  Service: Gastroenterology;  Laterality: N/A;  ? ENDOMETRIAL ABLATION    ? trigeminal stimulator    ? ? ?There were no vitals filed for this visit. ? ? Subjective Assessment - 11/28/21 0849   ? ? Subjective Wants to wind down and take a break for a while. Feels like she is maxed out doing her HEP. Wants today to be her last day. Has other doctor's appointments next week to go to. 5/10 L jaw pain currently.   ? Pertinent History TMJ. Had PT at [facility] which went well. No masks however, and preferes masks due to immune compromise. Tried learning to engage the pterygoid muscle. Has to learn how to use the right muscles due to EDH. Has an EMT muscle trainer  which helped her a lot. L jaw bothers her a lot. R jaw dislocates but can pop it back in. L jaw pops out but the soft tissue is very loose that even at rest, it is subluxed.   ? Currently in Pain? Yes   ? Pain Score 5    ? ?  ?  ? ?  ? ? ? ? ? ? ? ? ? ? ? ? ? ? ? ? ? ? ? ? ? ? ? ? ? ? ? ? ? PT Education - 11/28/21 1050   ? ? Education Details lateral pterygoid muscle   ? Person(s) Educated Patient   ? Methods Explanation;Demonstration   ? Comprehension Verbalized understanding   ? ?  ?  ? ?  ? ?   ?Medbridge Access Code [id number] ?  ?(R jaw deviation movement per pt) ?  ? ?Adhesive allergy.  ?  ?  ?Wants to graduate PT today per subjective ? ?There-ex:  ?  ?Majority of session focused on pt education of lateral pterygoid movement, activation, attacement to articular disc, with comparison to TMJ  MRI results and pt R jaw deviation movement.  ? ?Vsisuals such as videos, pictures and drawing utilized.  ? ?Recommended to continue gentle isometric L lateral deviation in neutral positing to balance muscle pull (improve R lateral pterygoid muscle activation) ? ? ?Followed up with pt education on gentle massage of L lateral pterygoid to hopefully decrease tension on disc and decrease R lateral jaw deviation.  ? ?Dizziness after session ?Blood pressure L arm sitting, mechanically taken, normal cuff: 106/67, HR 68 ? ?Pt states its normal to be dizzy due to disautonomia.  ? ? ?Skilled physical therapy services discharged for now seocndary to pt reports of being maxed out with exercises.  ? ? ?  ?   ?Improved exercise technique, movement at target joints, use of target muscles after mod verbal, visual, tactile cues.  ?  ?  ?  ?Response to treatment/Clinical impression ?Fair tolerance to today's session. Majority of session was focused on pt education of lateral pterygoid muscle/movement, activation, attachment to articular disc, with comparison to TMJ MRI results and pt R jaw deviation movement. Worked on pt education of gentle massage of L lateral pterygoid afterwards to help decrease tension and promote better jaw mechanics to decrease R jaw deviation and tension to L articular disk. Pt however reports dizziness afterwards (blood pressure 106/67, HR 68). Reports having dizziness at times due to dysautonomia. Overall pt demonstrates decrease of L jaw pain level at worst from 9/10 to 7/10 as well as improved ability to ambulate longer distances (7/10 highest pain level after walking for 90 minutes). Pt has made some progress with PT towards goals. Challenges to progress include Ehlers-Danlos Syndrome as well as dysautonomia. Skilled physical therapy services discharged for now secondary to pt reports of being maxed out with her exercises at the moment. Pt to continue with her exercises at home as best as possible to  maintain gains. ? ? ? ? ? PT Short Term Goals - 10/23/21 0807   ? ?  ? PT SHORT TERM GOAL #1  ? Title Patient will be independent with her initial HEP to decrease pain, improve strength, and ability to open and close her mouth more comfortably.   ? Baseline Pt has started her HEP (08/26/2021); 10/23/21: is independent, has decreased pain by 2-3 points.   ? Time 3   ? Period Weeks   ? Status Achieved   ?  Target Date 10/23/21   ? ?  ?  ? ?  ? ? ? ? PT Long Term Goals - 11/28/21 0851   ? ?  ? PT LONG TERM GOAL #1  ? Title Pt will have a decrease in L jaw pain to 3/10 or less at worst to promote ability to open and close her mouth, eat, as well as ambulate more comfortably.   ? Baseline 9/10 L jaw pain at worst for the past 3 months; 10/23/21: 6/10 NPS; 6/10 at most for the past 7 days (11/05/2021); 7/10 at most for the past 7 days. (11/28/2021)   ? Time 8   ? Period Weeks   ? Status On-going   ? Target Date 12/18/21   ?  ? PT LONG TERM GOAL #2  ? Title Pt will have a decrease in R jaw pain to 2/10 or less at worst to promote ability to open and close her mouth, eat, as well as ambulate more comfortably.   ? Baseline 5/10 R jaw pain at worst for the past 3 months (08/26/2021); 10/23/21: 2.5-3/10 NPS. 3/10 at most for the past 7 days (11/05/2021), (11/28/2021)   ? Time 8   ? Period Weeks   ? Status On-going   ? Target Date 12/18/21   ?  ? PT LONG TERM GOAL #3  ? Title Pt will improve her FOTO score by at least 10 points as a demonstration of improved function.   ? Baseline Orofascial FOTO 40 (pt might have answered questions based on wearing her jaw strap which helps decrease pain) 08/26/2021.; 10/23/21: 43   ? Time 8   ? Period Weeks   ? Status On-going   ? Target Date 12/18/21   ?  ? PT LONG TERM GOAL #4  ? Title Pt will report decrease in L jaw pain when ambulating to promote ability to walk longer distances.   ? Baseline Increased L jaw pain with gait, limiting the distance she ambulates (08/26/2021); 10/23/21: Initially 9/10 with  ambulation, is now reported down to a 6/10 NPS. Walking is getting better, jaw feels more stable and better able to engage the muscles better, the additional band also helps (11/05/2021); The walking

## 2021-12-04 ENCOUNTER — Encounter: Payer: Self-pay | Admitting: Family Medicine

## 2021-12-06 ENCOUNTER — Encounter: Payer: Self-pay | Admitting: Physician Assistant

## 2021-12-06 ENCOUNTER — Other Ambulatory Visit: Payer: Self-pay

## 2021-12-06 ENCOUNTER — Ambulatory Visit (INDEPENDENT_AMBULATORY_CARE_PROVIDER_SITE_OTHER): Payer: Medicare Other | Admitting: Physician Assistant

## 2021-12-06 VITALS — BP 106/66 | HR 64 | Temp 98.6°F | Wt 136.7 lb

## 2021-12-06 DIAGNOSIS — G509 Disorder of trigeminal nerve, unspecified: Secondary | ICD-10-CM | POA: Diagnosis not present

## 2021-12-06 DIAGNOSIS — D8940 Mast cell activation, unspecified: Secondary | ICD-10-CM

## 2021-12-06 DIAGNOSIS — G8929 Other chronic pain: Secondary | ICD-10-CM | POA: Diagnosis not present

## 2021-12-06 DIAGNOSIS — H9202 Otalgia, left ear: Secondary | ICD-10-CM

## 2021-12-06 MED ORDER — HYDROCORTISONE-ACETIC ACID 1-2 % OT SOLN: 2.0000 [drp] | Freq: Two times a day (BID) | OTIC | 0 refills | Status: DC

## 2021-12-06 NOTE — Progress Notes (Signed)
?  ? ?I,Tierney Behl Robinson,acting as a Education administrator for [employer], PA-C.,have documented all relevant documentation on the behalf of [name], PA-C,as directed by  [employer], PA-C while in the presence of [employer], PA-C.  ? ?Established patient visit ? ? ?Patient: Autumn Zhang   DOB: 23-Jun-1971   51 y.o. Female  MRN: [id number] ?Visit Date: 12/06/2021 ? ?Today's healthcare provider: [name], PA-C  ? ?Chief Complaint  ?Patient presents with  ? Otalgia  ? ?Subjective  ?  ?Pt with PMHx significant for mast cell activation syndrome, Ehlers-Danlos syndrome, TMJ , Trigeminal nerve disorder(left), Psoriatic arthritis, chronic ear pain(left), inner ear pain(left), chronic headache, chronic neck pain(left>right) and multiple other medical problems presents for left ear pain, fullness, itching deep in ear canal and pressure off and on since Dec'22 or before.  Pt reports taking Tylenol with mild relief.  Pain with tragus palpation, ear pulling. ? ?EAR PAIN ?Duration: months ?Involved ear(s): left ?Severity:  mild  ?Quality:  dull, itchy, pressure-like ?Fever: no ?Otorrhea: no ?Upper respiratory infection symptoms: seasonal allergy ?Hearing loss: no, she is hypersensitive to noises and has to wear ear plugs to make it more bearable. ?Dizziness: no ?Tinnitus: yes, chronic, no changes in tinnitus with current ear pain. ?Noticed that it has been worsening for the past year ?Water immersion yes ?Using Q-tips: yes ?Recurrent otitis media: no ?Treatments attempted: no ?Denies having ear surgeries or ear trauma, no hx of diabetes ? ?Patient fu with her current specialists, including Allergy/Immunology ?Medications: ?Outpatient Medications Prior to Visit  ?Medication Sig  ? acetaminophen (TYLENOL) 500 MG tablet Take 500 mg by mouth every 6 (six) hours as needed.  ? albuterol (PROVENTIL HFA;VENTOLIN HFA) 108 (90 Base) MCG/ACT inhaler Inhale 2 puffs into the lungs every 4 (four) hours as needed.   ? Ascorbic Acid (VITAMIN C)  1000 MG tablet Take 1,000 mg by mouth daily. probably total is 900 mg  ? aspirin 325 MG tablet Take 325 mg by mouth.  ? augmented betamethasone dipropionate (DIPROLENE-AF) 0.05 % cream APPLY TWICE DAILY AS NEEDED TO THE AFFECTED AREAS FOR 5-7 DAYS FOR ECZEMA.  ? b complex vitamins capsule Take 1 capsule by mouth daily. 1/4 capsule a day  ? BELSOMRA 5 MG TABS Take 1 tablet by mouth at bedtime as needed.   ? Blood Pressure Monitoring (BLOOD PRESSURE CUFF) MISC Take blood pressure as needed for symptoms.  ? Boswellia Serrata (BOSWELLIA PO) Take 150 mg by mouth every morning.   ? Calcium Citrate 200 MG TABS Take 600 mg by mouth.  ? cromolyn (GASTROCROM) 100 MG/5ML solution Take by mouth 4 (four) times daily -  before meals and at bedtime.  ? Cyanocobalamin (VITAMIN B-12) 1000 MCG SUBL Place under the tongue.  ? desloratadine (CLARINEX) 5 MG tablet Take 5 mg by mouth as needed.  ? diphenhydrAMINE (BENADRYL) 12.5 MG/5ML liquid Take by mouth 4 (four) times daily as needed.  ? diphenhydrAMINE-Allantoin 2-0.5 % CREA Apply 1 application topically as needed.   ? docusate sodium (COLACE) 100 MG capsule Take 100 mg by mouth every other day.   ? econazole nitrate 1 % cream APPLY TO FEET TWICE A DAY FOR 2 4 WEEKS AS NEEDED FOR FLARE  ? EPINEPHrine 0.3 mg/0.3 mL IJ SOAJ injection INJECT ONE AUTO-INJECTOR INTRAMUSCULARLY AS NEEDED  ? estradiol (ESTRACE) 0.1 MG/GM vaginal cream INSERT 1 GRAM VAGINALLY TWICE WEEKLY  ? famotidine (PEPCID) 10 MG tablet Take 10 mg by mouth daily.   ? fexofenadine (ALLEGRA) 180  MG tablet Take 180 mg by mouth 2 (two) times daily.  ? guaifenesin (HUMIBID E) 400 MG TABS tablet Take 400 mg by mouth as needed.  ? hydrOXYzine (VISTARIL) 25 MG capsule Take by mouth. 25-50 MG PRN  ? IODINE EX Apply 225 mcg topically. Every 3 days  ? KETAMINE HCL SL Place into right nostril as needed.  ? ketotifen (ZADITOR) 0.025 % ophthalmic solution Place 1 drop into both eyes daily as needed.  ? Ketotifen Fumarate POWD 1 mg by  Does not apply route. 5-6 times a day  ? L-Lysine 500 MG CAPS Take by mouth 3 (three) times daily.  ? L-Theanine 100 MG CAPS Take 200 mg by mouth daily.   ? levocetirizine (XYZAL) 2.5 MG/5ML solution as needed.  ? Light Mineral Oil-Mineral Oil 0.5-0.5 % EMUL Apply 1 application to eye daily.  ? lipase/protease/amylase (CREON) 12000-38000 units CPEP capsule Take 12,000 Units by mouth as directed.  ? loperamide (IMODIUM) 2 MG capsule Take by mouth as needed for diarrhea or loose stools.  ? Magnesium Citrate POWD Take 133 mg by mouth daily.  ? Melatonin 1 MG/ML LIQD Take by mouth daily.   ? montelukast (SINGULAIR) 5 MG chewable tablet Chew 5 mg by mouth in the morning and at bedtime.  ? Non Gelatin Capsules, Empty, (CAPSULE #3 CLEAR/CLEAR VEG) CAPS Take 1 capsule by mouth daily.  ? NONFORMULARY OR COMPOUNDED ITEM Bupivacaine nasal 0.5% drops w/o epi  ? omega-3 acid ethyl esters (LOVAZA) 1 g capsule Take 2 capsules (2 g total) by mouth 2 (two) times daily.  ? pseudoephedrine (SUDAFED) 30 MG tablet Take 30 mg by mouth every 4 (four) hours as needed for congestion.   ? pyridostigmine (MESTINON) 60 MG tablet Take 15 mg by mouth. 3-4 times a day  ? Quercetin 500 MG CAPS Take by mouth daily.  ? RESTASIS 0.05 % ophthalmic emulsion INSTILL 1 DROP INTO BOTH EYES TWICE A DAY  ? Riboflavin (VITAMIN B-2 PO) Take 100 mg by mouth 3 (three) times daily.  ? Rimegepant Sulfate (NURTEC) 75 MG TBDP Take 1 tablet by mouth as needed.  ? Selenium 200 MCG CAPS Take 1 capsule by mouth once a week.  ? simethicone (MYLICON) 80 MG chewable tablet Chew 80 mg by mouth every 6 (six) hours as needed for flatulence.  ? Sodium Fluoride 1.1 % PSTE Place onto teeth.  ? TRIAMCINOLONE ACETONIDE,NASAL, NA Place into the nose as needed.  ? trolamine salicylate (ASPERCREME/ALOE) 10 % cream Apply topically.  ? Vitamin D, Cholecalciferol, 10 MCG (400 UNIT) TABS Take by mouth daily.   ? Atogepant (QULIPTA) 10 MG TABS Take 10 mg by mouth daily.  ? ?No  facility-administered medications prior to visit.  ? ? ?Review of Systems  ?HENT:  Positive for ear pain. Negative for ear discharge, hearing loss, rhinorrhea and sore throat.   ?Respiratory:  Negative for cough.   ?Gastrointestinal:  Negative for abdominal pain, diarrhea and vomiting.  ?Musculoskeletal:  Negative for neck pain.  ?Skin:  Negative for rash.  ?Neurological:  Positive for headaches.  ?See HPI ? ?  Objective  ?  ?BP 106/66 (BP Location: Right Arm, Patient Position: Sitting, Cuff Size: Normal)   Pulse 64   Temp 98.6 ?F (37 ?C) (Oral)   Wt 136 lb 11.2 oz (62 kg)   SpO2 99%   BMI 26.70 kg/m?  ? ?Physical Exam ?Vitals and nursing note reviewed.  ?Constitutional:   ?   General: She is in acute distress (  mild).  ?   Appearance: Normal appearance.  ?HENT:  ?   Head: Normocephalic and atraumatic.  ?   Comments: Jaw band in place ?   Right Ear: Tympanic membrane normal.  ?   Ears:  ?   Comments:  ?Tenderness when the tragus is pushed or the auricle is pulled or during the otoscopic exam. ?Mild erythema and swelling of the external auditory canal. No discharge. ?   Nose: Nose normal.  ?   Mouth/Throat:  ?   Mouth: Mucous membranes are moist.  ?Eyes:  ?   Pupils: Pupils are equal, round, and reactive to light.  ?Cardiovascular:  ?   Rate and Rhythm: Normal rate.  ?   Pulses: Normal pulses.  ?Pulmonary:  ?   Effort: Pulmonary effort is normal.  ?Musculoskeletal:  ?   Cervical back: Normal range of motion and neck supple. No tenderness.  ?Lymphadenopathy:  ?   Cervical: No cervical adenopathy.  ?Neurological:  ?   Mental Status: She is alert.  ?  ? Assessment & Plan  ?  ? ?  1. Ear pain, left ?Suspected 2/2 mild otitis externa ?Based on HPI, PE  ?- acetic acid-hydrocortisone (VOSOL-HC) OTIC solution; Place 2 drops into the left ear 2 (two) times daily.  Dispense: 10 mL; Refill: 0  ?- Patient is encouraged to keep ears dry. Avoid getting water in the affected ear when bathing or showering by using a cotton ball  coated with petroleum jelly in the ear to create a barrier ?- We discussed about her possible reaction to steroid in otic solution, however, patient prefers to try the ear drops and stated that she did not have any

## 2021-12-09 ENCOUNTER — Encounter: Payer: Self-pay | Admitting: Physician Assistant

## 2021-12-12 ENCOUNTER — Ambulatory Visit (INDEPENDENT_AMBULATORY_CARE_PROVIDER_SITE_OTHER): Payer: Medicare Other | Admitting: Physician Assistant

## 2021-12-12 VITALS — BP 110/66 | HR 58 | Temp 98.6°F | Wt 146.1 lb

## 2021-12-12 DIAGNOSIS — H9202 Otalgia, left ear: Secondary | ICD-10-CM

## 2021-12-12 NOTE — Progress Notes (Signed)
? ? ?I,Elena D DeSanto,acting as a scribe for [employer], PA-C.,have documented all relevant documentation on the behalf of [name], PA-C,as directed by  [employer], PA-C while in the presence of [employer], PA-C. ? ? ?Established Patient Office Visit ? ?Subjective:  ?Patient ID: Autumn Zhang, female    DOB: 09-16-1970  Age: 51 y.o. MRN: [id number] ? ?CC:  ?Chief Complaint  ?Patient presents with  ? Follow-up  ? Ear Pain  ? ? ?HPI ?Autumn Zhang presents for follow up of her left ear pain.  She was last seen on 12/06/21.  At that time she was diagnosed with mild otitis externa.  Management included Vosol otic solution which she was not able to use after 3 days due to reaction.  Patient reports mildly improved.   ? ?Past Medical History:  ?Diagnosis Date  ? Allergy   ? Anemia   ? Anxiety   ? Arthritis   ? Asthma   ? Attention deficit hyperactivity disorder (ADHD), combined type 10/18/2015  ? Diagnosed many years ago.  Has been doing well on Ritalin 81m QID to six times a day. Long acting hasn't worked well in the past and she has not wanted to try Adderall. She is taking wellbutrin as well, but she has been weaning medication due to SE migraine.  She is now down to Wellbutrin one quarter tablet daily.  Last Assessment & Plan:  A/P: Stable.  Continue current dosing.  Refilled ?3 months  ? Basal cell carcinoma 05/16/2021  ? Superf[place] ? BRCA negative 09/2018  ? [facility] [place] [facility]; 1/20 MyRisk neg except RPS20 VUS; IBIS=10.8%/riskscore=12%  ? [place] cancer ([place]   ? [place]   ? Ehlers-Danlos syndrome type III   ? Family history of colon cancer   ? GERD (gastroesophageal reflux disease)   ? Hyperlipidemia   ? Idiopathic small fiber peripheral neuropathy   ? Leiomyoma   ? [place]ast cell activation ([place]   ? Migraines   ? Neck pain   ? Occipital neuralgia   ? Sleep apnea   ? ? ?Past Surgical History:  ?Procedure Laterality Date  ? APPENDECTOMY     ? removed with colectomy  ? BREAST SURGERY    ? reduction  ? COLON SURGERY  2008  ? for colon cancer, ~1/3 of colon removed, primary reanastimosis  ? COLONOSCOPY WITH PROPOFOL N/A 10/07/2018  ? Procedure: COLONOSCOPY WITH PROPOFOL;  Surgeon: VLin Landsman MD;  [facility];  Service: Gastroenterology;  Laterality: N/A;  ? COLONOSCOPY WITH PROPOFOL N/A 08/14/2021  ? Procedure: COLONOSCOPY WITH PROPOFOL;  Surgeon: VLin Landsman MD;  [facility];  Service: Gastroenterology;  Laterality: N/A;  ? ENDOMETRIAL ABLATION    ? trigeminal stimulator    ? ? ?Family History  ?Problem Relation Age of Onset  ? Colon cancer Father 455 ? Breast cancer Maternal Aunt 547 ?     BRCA neg  ? Endometrial cancer Maternal Aunt 50  ? Colon cancer Maternal Grandmother 841 ? Migraines Maternal Grandmother   ? Tremor Maternal Grandmother   ? Leukemia Maternal Grandfather 857 ? Food Allergy Maternal Grandfather   ? Migraines Mother   ? Glaucoma Mother   ? Cataracts Mother   ? Tremor Mother   ? Fibromyalgia Mother   ? Heart disease Mother   ? Colon polyps Brother   ?     non-cancerous  ? ? ?  Social History  ? ?Socioeconomic History  ? Marital status: Married  ?  Spouse name: Not on file  ? Number of children: 0  ? Years of education: Not on file  ? Highest education level: Not on file  ?Occupational History  ? Occupation: SSI and disability  ?Tobacco Use  ? Smoking status: Former  ?  Types: Cigarettes  ? Smokeless tobacco: Never  ? Tobacco comments:  ?  Quit 30 yrs ago  ?Vaping Use  ? Vaping Use: Never used  ?Substance and Sexual Activity  ? Alcohol use: Never  ? Drug use: Never  ? Sexual activity: Not Currently  ?  Birth control/protection: None  ?Other Topics Concern  ? Not on file  ?Social History Narrative  ? Not on file  ? ?Social Determinants of Health  ? ?Financial Resource Strain: Not on file  ?Food Insecurity: Not on file  ?Transportation Needs: Not on file  ?Physical Activity: Not on file   ?Stress: Not on file  ?Social Connections: Not on file  ?Intimate Partner Violence: Not on file  ? ? ?Outpatient Medications Prior to Visit  ?Medication Sig Dispense Refill  ? acetaminophen (TYLENOL) 500 MG tablet Take 500 mg by mouth every 6 (six) hours as needed.    ? acetic acid-hydrocortisone (VOSOL-HC) OTIC solution Place 2 drops into the left ear 2 (two) times daily. 10 mL 0  ? albuterol (PROVENTIL HFA;VENTOLIN HFA) 108 (90 Base) MCG/ACT inhaler Inhale 2 puffs into the lungs every 4 (four) hours as needed.     ? Ascorbic Acid (VITAMIN C) 1000 MG tablet Take 1,000 mg by mouth daily. probably total is 900 mg    ? aspirin 325 MG tablet Take 325 mg by mouth.    ? augmented betamethasone dipropionate (DIPROLENE-AF) 0.05 % cream APPLY TWICE DAILY AS NEEDED TO THE AFFECTED AREAS FOR 5-7 DAYS FOR ECZEMA.    ? b complex vitamins capsule Take 1 capsule by mouth daily. 1/4 capsule a day    ? BELSOMRA 5 MG TABS Take 1 tablet by mouth at bedtime as needed.     ? Blood Pressure Monitoring (BLOOD PRESSURE CUFF) MISC Take blood pressure as needed for symptoms.    ? Boswellia Serrata (BOSWELLIA PO) Take 150 mg by mouth every morning.     ? Calcium Citrate 200 MG TABS Take 600 mg by mouth.    ? cromolyn (GASTROCROM) 100 MG/5ML solution Take by mouth 4 (four) times daily -  before meals and at bedtime.    ? Cyanocobalamin (VITAMIN B-12) 1000 MCG SUBL Place under the tongue.    ? desloratadine (CLARINEX) 5 MG tablet Take 5 mg by mouth as needed.    ? diphenhydrAMINE (BENADRYL) 12.5 MG/5ML liquid Take by mouth 4 (four) times daily as needed.    ? diphenhydrAMINE-Allantoin 2-0.5 % CREA Apply 1 application topically as needed.     ? docusate sodium (COLACE) 100 MG capsule Take 100 mg by mouth every other day.     ? econazole nitrate 1 % cream APPLY TO FEET TWICE A DAY FOR 2 4 WEEKS AS NEEDED FOR FLARE    ? EPINEPHrine 0.3 mg/0.3 mL IJ SOAJ injection INJECT ONE AUTO-INJECTOR INTRAMUSCULARLY AS NEEDED    ? estradiol (ESTRACE) 0.1  MG/GM vaginal cream INSERT 1 GRAM VAGINALLY TWICE WEEKLY 42.5 g 1  ? famotidine (PEPCID) 10 MG tablet Take 10 mg by mouth daily.     ? fexofenadine (ALLEGRA) 180 MG tablet Take 180 mg by mouth 2 (  two) times daily.    ? guaifenesin (HUMIBID E) 400 MG TABS tablet Take 400 mg by mouth as needed.    ? hydrOXYzine (VISTARIL) 25 MG capsule Take by mouth. 25-50 MG PRN    ? IODINE EX Apply 225 mcg topically. Every 3 days    ? KETAMINE HCL SL Place into right nostril as needed.    ? ketotifen (ZADITOR) 0.025 % ophthalmic solution Place 1 drop into both eyes daily as needed.    ? Ketotifen Fumarate POWD 1 mg by Does not apply route. 5-6 times a day    ? L-Lysine 500 MG CAPS Take by mouth 3 (three) times daily.    ? L-Theanine 100 MG CAPS Take 200 mg by mouth daily.     ? levocetirizine (XYZAL) 2.5 MG/5ML solution as needed.    ? Light Mineral Oil-Mineral Oil 0.5-0.5 % EMUL Apply 1 application to eye daily.    ? lipase/protease/amylase (CREON) 12000-38000 units CPEP capsule Take 12,000 Units by mouth as directed.    ? loperamide (IMODIUM) 2 MG capsule Take by mouth as needed for diarrhea or loose stools.    ? Magnesium Citrate POWD Take 133 mg by mouth daily.    ? Melatonin 1 MG/ML LIQD Take by mouth daily.     ? montelukast (SINGULAIR) 5 MG chewable tablet Chew 5 mg by mouth in the morning and at bedtime.    ? Non Gelatin Capsules, Empty, (CAPSULE #3 CLEAR/CLEAR VEG) CAPS Take 1 capsule by mouth daily.    ? NONFORMULARY OR COMPOUNDED ITEM Bupivacaine nasal 0.5% drops w/o epi    ? omega-3 acid ethyl esters (LOVAZA) 1 g capsule Take 2 capsules (2 g total) by mouth 2 (two) times daily. 360 capsule 3  ? pseudoephedrine (SUDAFED) 30 MG tablet Take 30 mg by mouth every 4 (four) hours as needed for congestion.     ? pyridostigmine (MESTINON) 60 MG tablet Take 15 mg by mouth. 3-4 times a day    ? Quercetin 500 MG CAPS Take by mouth daily.    ? RESTASIS 0.05 % ophthalmic emulsion INSTILL 1 DROP INTO BOTH EYES TWICE A DAY 180 mL 1  ?  Riboflavin (VITAMIN B-2 PO) Take 100 mg by mouth 3 (three) times daily.    ? Rimegepant Sulfate (NURTEC) 75 MG TBDP Take 1 tablet by mouth as needed.    ? Selenium 200 MCG CAPS Take 1 capsule by mouth once a we

## 2021-12-13 ENCOUNTER — Ambulatory Visit: Payer: Medicare Other | Admitting: Physician Assistant

## 2021-12-15 ENCOUNTER — Encounter: Payer: Self-pay | Admitting: Physician Assistant

## 2021-12-16 ENCOUNTER — Other Ambulatory Visit: Payer: Self-pay

## 2021-12-16 MED ORDER — CYCLOSPORINE 0.05 % OP EMUL: 1.0000 [drp] | Freq: Two times a day (BID) | OPHTHALMIC | 1 refills | Status: DC

## 2021-12-18 NOTE — Progress Notes (Signed)
?  ?I,Autumn Zhang,acting as a scribe for [employer], PA-C.,have documented all relevant documentation on the behalf of Autumn Speak, PA-C,as directed by  [employer], PA-C while in the presence of [employer], PA-C. ? ? ? ?Established patient visit ? ? ?Patient: Autumn Zhang   DOB: 06-Nov-1970   51 y.o. Female  MRN: [id number] ?Visit Date: 12/19/2021 ? ?Today's healthcare provider: [name], PA-C  ? ?CC: follow up ? ? ?Subjective  ?  ?HPI  ?Patient is a 51 year old female who presents for follow up of ear pain.  She was last seen for this on 12/12/21 and initially seen on 12/06/21.  Patient reports she feels she is finally better and has no complaints of problems with her ears. ? ? ?Medications: ?Outpatient Medications Prior to Visit  ?Medication Sig  ? acetaminophen (TYLENOL) 500 MG tablet Take 500 mg by mouth every 6 (six) hours as needed.  ? albuterol (PROVENTIL HFA;VENTOLIN HFA) 108 (90 Base) MCG/ACT inhaler Inhale 2 puffs into the lungs every 4 (four) hours as needed.   ? Ascorbic Acid (VITAMIN C) 1000 MG tablet Take 1,000 mg by mouth daily. probably total is 900 mg  ? aspirin 325 MG tablet Take 325 mg by mouth.  ? augmented betamethasone dipropionate (DIPROLENE-AF) 0.05 % cream APPLY TWICE DAILY AS NEEDED TO THE AFFECTED AREAS FOR 5-7 DAYS FOR ECZEMA.  ? b complex vitamins capsule Take 1 capsule by mouth daily. 1/4 capsule a day  ? BELSOMRA 5 MG TABS Take 1 tablet by mouth at bedtime as needed.   ? Blood Pressure Monitoring (BLOOD PRESSURE CUFF) MISC Take blood pressure as needed for symptoms.  ? Boswellia Serrata (BOSWELLIA PO) Take 150 mg by mouth every morning.   ? Calcium Citrate 200 MG TABS Take 600 mg by mouth.  ? cromolyn (GASTROCROM) 100 MG/5ML solution Take by mouth 4 (four) times daily -  before meals and at bedtime.  ? Cyanocobalamin (VITAMIN B-12) 1000 MCG SUBL Place under the tongue.  ? cycloSPORINE (RESTASIS) 0.05 % ophthalmic emulsion Place 1 drop into both eyes 2 (two) times  daily.  ? desloratadine (CLARINEX) 5 MG tablet Take 5 mg by mouth as needed.  ? diphenhydrAMINE (BENADRYL) 12.5 MG/5ML liquid Take by mouth 4 (four) times daily as needed.  ? diphenhydrAMINE-Allantoin 2-0.5 % CREA Apply 1 application topically as needed.   ? docusate sodium (COLACE) 100 MG capsule Take 100 mg by mouth every other day.   ? econazole nitrate 1 % cream APPLY TO FEET TWICE A DAY FOR 2 4 WEEKS AS NEEDED FOR FLARE  ? EPINEPHrine 0.3 mg/0.3 mL IJ SOAJ injection INJECT ONE AUTO-INJECTOR INTRAMUSCULARLY AS NEEDED  ? estradiol (ESTRACE) 0.1 MG/GM vaginal cream INSERT 1 GRAM VAGINALLY TWICE WEEKLY  ? famotidine (PEPCID) 10 MG tablet Take 10 mg by mouth daily.   ? fexofenadine (ALLEGRA) 180 MG tablet Take 180 mg by mouth 2 (two) times daily.  ? guaifenesin (HUMIBID E) 400 MG TABS tablet Take 400 mg by mouth as needed.  ? hydrOXYzine (VISTARIL) 25 MG capsule Take by mouth. 25-50 MG PRN  ? IODINE EX Apply 225 mcg topically. Every 3 days  ? KETAMINE HCL SL Place into right nostril as needed.  ? ketotifen (ZADITOR) 0.025 % ophthalmic solution Place 1 drop into both eyes daily as needed.  ? Ketotifen Fumarate POWD 1 mg by Does not apply route. 5-6 times a day  ? L-Lysine 500 MG CAPS Take by mouth 3 (three) times  daily.  ? L-Theanine 100 MG CAPS Take 200 mg by mouth daily.   ? levocetirizine (XYZAL) 2.5 MG/5ML solution as needed.  ? Light Mineral Oil-Mineral Oil 0.5-0.5 % EMUL Apply 1 application to eye daily.  ? lipase/protease/amylase (CREON) 12000-38000 units CPEP capsule Take 12,000 Units by mouth as directed.  ? loperamide (IMODIUM) 2 MG capsule Take by mouth as needed for diarrhea or loose stools.  ? Magnesium Citrate POWD Take 133 mg by mouth daily.  ? Melatonin 1 MG/ML LIQD Take by mouth daily.   ? montelukast (SINGULAIR) 5 MG chewable tablet Chew 5 mg by mouth in the morning and at bedtime.  ? Non Gelatin Capsules, Empty, (CAPSULE #3 CLEAR/CLEAR VEG) CAPS Take 1 capsule by mouth daily.  ? NONFORMULARY OR  COMPOUNDED ITEM Bupivacaine nasal 0.5% drops w/o epi  ? omega-3 acid ethyl esters (LOVAZA) 1 g capsule Take 2 capsules (2 g total) by mouth 2 (two) times daily.  ? pseudoephedrine (SUDAFED) 30 MG tablet Take 30 mg by mouth every 4 (four) hours as needed for congestion.   ? pyridostigmine (MESTINON) 60 MG tablet Take 15 mg by mouth. 3-4 times a day  ? Quercetin 500 MG CAPS Take by mouth daily.  ? Riboflavin (VITAMIN B-2 PO) Take 100 mg by mouth 3 (three) times daily.  ? Rimegepant Sulfate (NURTEC) 75 MG TBDP Take 1 tablet by mouth as needed.  ? Selenium 200 MCG CAPS Take 1 capsule by mouth once a week.  ? simethicone (MYLICON) 80 MG chewable tablet Chew 80 mg by mouth every 6 (six) hours as needed for flatulence.  ? Sodium Fluoride 1.1 % PSTE Place onto teeth.  ? TRIAMCINOLONE ACETONIDE,NASAL, NA Place into the nose as needed.  ? trolamine salicylate (ASPERCREME/ALOE) 10 % cream Apply topically.  ? Vitamin D, Cholecalciferol, 10 MCG (400 UNIT) TABS Take by mouth daily.   ? ?No facility-administered medications prior to visit.  ? ? ?Review of Systems  ?All other systems reviewed and are negative. ? ? ?  Objective  ?  ?BP 110/67 (BP Location: Left Arm, Patient Position: Sitting, Cuff Size: Normal)   Pulse 71   Temp (!) 97.5 ?F (36.4 ?C) (Oral)   Wt 147 lb (66.7 kg)   SpO2 100%   BMI 28.71 kg/m?  ? ? ?Physical Exam ?Vitals and nursing note reviewed.  ?Constitutional:   ?   Appearance: Normal appearance.  ?HENT:  ?   Head: Normocephalic and atraumatic.  ?   Right Ear: Tympanic membrane normal.  ?   Left Ear: Tympanic membrane normal.  ?Eyes:  ?   Extraocular Movements: Extraocular movements intact.  ?   Conjunctiva/sclera: Conjunctivae normal.  ?   Pupils: Pupils are equal, round, and reactive to light.  ?Neurological:  ?   Mental Status: She is alert.  ?Psychiatric:     ?   Mood and Affect: Mood normal.     ?   Behavior: Behavior normal.     ?   Thought Content: Thought content normal.     ?   Judgment: Judgment  normal.  ?  ? ? Assessment & Plan  ?  ? ?1. Ear pain, left ?Resolved. ?FU with Dr. Brita Romp ? ?   ?The patient was advised to call back or seek an in-person evaluation if the symptoms worsen or if the condition fails to improve as anticipated. ? ?I discussed the assessment and treatment plan with the patient. The patient was provided an opportunity to ask questions and  all were answered. The patient agreed with the plan and demonstrated an understanding of the instructions. ? ?The entirety of the information documented in the History of Present Illness, Review of Systems and Physical Exam were personally obtained by me. Portions of this information were initially documented by the CMA and reviewed by me for thoroughness and accuracy.   ? ? ?Autumn Speak, PA-C  ?[facility] ?[phone] (phone) ?[phone] (fax) ? ?[facility] ?

## 2021-12-19 ENCOUNTER — Ambulatory Visit (INDEPENDENT_AMBULATORY_CARE_PROVIDER_SITE_OTHER): Payer: Medicare Other | Admitting: Physician Assistant

## 2021-12-19 ENCOUNTER — Encounter: Payer: Self-pay | Admitting: Physician Assistant

## 2021-12-19 VITALS — BP 110/67 | HR 71 | Temp 97.5°F | Wt 147.0 lb

## 2021-12-19 DIAGNOSIS — H9202 Otalgia, left ear: Secondary | ICD-10-CM

## 2021-12-25 ENCOUNTER — Inpatient Hospital Stay: Payer: Medicare Other | Attending: Oncology

## 2021-12-25 ENCOUNTER — Inpatient Hospital Stay (HOSPITAL_BASED_OUTPATIENT_CLINIC_OR_DEPARTMENT_OTHER): Payer: Medicare Other | Admitting: Oncology

## 2021-12-25 ENCOUNTER — Telehealth: Payer: Self-pay | Admitting: Oncology

## 2021-12-25 ENCOUNTER — Encounter: Payer: Self-pay | Admitting: Oncology

## 2021-12-25 DIAGNOSIS — G2581 Restless legs syndrome: Secondary | ICD-10-CM | POA: Diagnosis not present

## 2021-12-25 DIAGNOSIS — D509 Iron deficiency anemia, unspecified: Secondary | ICD-10-CM

## 2021-12-25 DIAGNOSIS — Z862 Personal history of diseases of the blood and blood-forming organs and certain disorders involving the immune mechanism: Secondary | ICD-10-CM

## 2021-12-25 LAB — IRON AND TIBC
Iron: 63 ug/dL (ref 28–170)
Saturation Ratios: 16 % (ref 10.4–31.8)
TIBC: 393 ug/dL (ref 250–450)
UIBC: 330 ug/dL

## 2021-12-25 LAB — CBC
HCT: 40.3 % (ref 36.0–46.0)
Hemoglobin: 13 g/dL (ref 12.0–15.0)
MCH: 29.8 pg (ref 26.0–34.0)
MCHC: 32.3 g/dL (ref 30.0–36.0)
MCV: 92.4 fL (ref 80.0–100.0)
Platelets: 320 10*3/uL (ref 150–400)
RBC: 4.36 MIL/uL (ref 3.87–5.11)
RDW: 12 % (ref 11.5–15.5)
WBC: 9.5 10*3/uL (ref 4.0–10.5)
nRBC: 0 % (ref 0.0–0.2)

## 2021-12-25 LAB — FERRITIN: Ferritin: 60 ng/mL (ref 11–307)

## 2021-12-25 NOTE — Telephone Encounter (Signed)
Pt called in to resch appt for 04/17 to 05/05 and 12/18 est visit to my chart on 12/19 KJ  ?

## 2021-12-26 ENCOUNTER — Encounter: Payer: Self-pay | Admitting: Oncology

## 2021-12-28 ENCOUNTER — Encounter: Payer: Self-pay | Admitting: Oncology

## 2021-12-28 NOTE — Progress Notes (Signed)
? ? ? ?Hematology/Oncology Consult note ?[facility]  ?[phone] [phone] ? ?Patient Care Team: ?Virginia Crews, MD as PCP - General (Family Medicine) ?Clent Jacks, RN as Registered Nurse  ? ?Name of the patient: Autumn Zhang  ?[id number]  ?Feb 06, 1971  ? ?Date of visit: 12/28/21 ? ?Diagnosis-iron deficiency anemia ? ?Chief complaint/ Reason for visit-routine follow-up of iron deficiency anemia ? 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  She underwent right hemicolectomy which revealed poorly differentiated invasive adenocarcinoma T3N0.  0 out of 34 lymph nodes were positive.  She received 12 cycles of adjuvant chemotherapy with 5-FU leucovorin.  No oxaliplatin.  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 her father died of colon cancer at the age of 32.  Her only brother has also had colon polyps.  She has therefore been managed as a Lynch syndrome patient despite not having genetic features suggestive of Lynch syndrome.  She took prophylactic aspirin for a while and then she stopped taking it.  She has been getting colonoscopies every 1 to 2 years.  She has also had capsule endoscopy in 2014 for iron deficiency anemia that was normal.  She has not required any surveillance scans as she has been more than 5 years since her diagnosis of colon cancer. She has established care with Dr. Marius Ditch for this. ?  ?Other than that patient has a history of basal cell skin cancer x2 and is in the process of finding a dermatologist.  She was also getting yearly pelvic exams and pelvic ultrasound to screen for GYN cancers.  Patient has a history of mast cell activation syndrome and often needs Benadryl before taking oral iron.  Also reports that she has dysautonomia and pots for which she sees cardiology. Also has a h/o Ehler Danlos syndrome ?   ?Currently patient has an ongoing migraine attack.  She is wearing dark glasses and a cat and unable to see eye to eye.  She denies any blood in her stool or urine presently.  Denies any dark melanotic stools.  Her last IV iron was about 6 months ago and she does not remember what type of IV iron she has received.  ?  ?She sees Dr. Esperanza Sheets from [facility] for mast cell activation syndrome ?  ?  ? ?Interval history- Patient reports ongoing fatigue and restless leg symptoms. Also has mast cell flare up episodes intermittently ? ?ECOG PS- 0 ?Pain scale- 0 ? ? ?Review of systems- Review of Systems  ?Constitutional:  Positive for malaise/fatigue. Negative for chills, fever and weight loss.  ?HENT:  Negative for congestion, ear discharge and nosebleeds.   ?Eyes:  Negative for blurred vision.  ?Respiratory:  Negative for cough, hemoptysis, sputum production, shortness of breath and wheezing.   ?Cardiovascular:  Negative for chest pain, palpitations, orthopnea and claudication.  ?Gastrointestinal:  Negative for abdominal pain, blood in stool, constipation, diarrhea, heartburn, melena, nausea and vomiting.  ?Genitourinary:  Negative for dysuria, flank pain, frequency, hematuria and urgency.  ?Musculoskeletal:  Negative for back pain, joint pain and myalgias.  ?Skin:  Negative for rash.  ?Neurological:  Negative for dizziness, tingling, focal weakness, seizures, weakness and headaches.  ?Endo/Heme/Allergies:  Does not bruise/bleed easily.  ?Psychiatric/Behavioral:  Negative for depression and suicidal ideas. The patient does not have insomnia.    ? ? ? ?Allergies  ?Allergen Reactions  ? Iodinated  Contrast Media Hives, Itching, Palpitations, Photosensitivity and Shortness Of Breath  ?  Other reaction(s): Headache ?Other reaction(s): Headache  ? Ioversol Anxiety, Hives, Itching and Palpitations  ? Ciprofloxacin Other (See Comments)  ? Eggs Or Egg-Derived Products Other (See Comments)  ?  Exacerbates neuralgia symptoms ?Exacerbates  neuralgia symptoms ?Congestion & Headaches ?Exacerbates neuralgia symptoms ?Congestion & Headaches ?  ? Ginger Other (See Comments)  ?  Congestion & Headaches ?Congestion & Headaches ?  ? Levofloxacin Other (See Comments)  ?  Congestion & Headaches ?Congestion & Headaches ?  ? Other Other (See Comments)  ?  Novocaine (IV) form ?Hearing loss ?Novocaine (IV) form ?Hearing loss ? ?Congestion & Headaches ?Congestion & Headaches ?Congestion & Headaches ?Congestion & Headaches ? ?Other reaction(s): Unknown  ? Soy Allergy   ?  Other reaction(s): Other (See Comments), Unknown ?Congestion & Headaches ?Congestion & Headaches  ? Soybean-Containing Drug Products Other (See Comments)  ?  Congestion & Headaches ?  ? Bee Pollen   ? Bupropion   ? Celebrex [Celecoxib]   ? Codeine   ? Dexilant [Dexlansoprazole]   ? Dulera [Mometasone Furo-Formoterol Fum]   ? Fentanyl   ? Fluticasone   ?  Inhaled into lungs it does not do well  ? Gabapentin   ? Gluten Meal   ? Histidine   ? Lidocaine   ?  Rash to topical cream  ? Lyrica [Pregabalin]   ? Mold Extract [Trichophyton]   ? Morphine And Related   ? Penicillins   ?  Skin testing positive for allergy  ? Pollen Extract   ? Polysorbate [Sorbitan]   ? Prilosec [Omeprazole]   ? Rizatriptan Other (See Comments)  ?  Dizziness, headache, myalgias, nausea  ? Salmeterol Xinafoate   ? Silenor [Doxepin Hcl]   ? Stevioside Other (See Comments)  ? Tomato   ? Xylitol Other (See Comments)  ? Brewer's Dried Yeast [Yeast] Other (See Comments)  ?  Headache, flushing  ? Ethanol Dermatitis, Other (See Comments), Itching and Palpitations  ? Hydrocodone Nausea Only  ? Ibuprofen Nausea And Vomiting  ?  At high doses ?At high doses ?  ? Lac Bovis Diarrhea  ?  Facial burning sensation ?Facial burning sensation ?Facial burning sensation ?Facial burning sensation ?  ? Lanolin Other (See Comments)  ?  Exacerbates neuralgia symptoms ?Wool causes hives. ?  ? Milk-Related Compounds Diarrhea  ?  Facial burning sensation ?   ? Morphine Nausea Only  ? Wheat Bran Diarrhea  ? Yeast-Related Products Other (See Comments)  ?  Headache, flushing  ? ? ? ?Past Medical History:  ?Diagnosis Date  ? Allergy   ? Anemia   ? Anxiety   ? Arthritis   ? Asthma   ? Attention deficit hyperactivity disorder (ADHD), combined type 10/18/2015  ? Diagnosed many years ago.  Has been doing well on Ritalin 65m QID to six times a day. Long acting hasn't worked well in the past and she has not wanted to try Adderall. She is taking wellbutrin as well, but she has been weaning medication due to SE migraine.  She is now down to Wellbutrin one quarter tablet daily.  Last Assessment & Plan:  A/P: Stable.  Continue current dosing.  Refilled ?3 months  ? Basal cell carcinoma 05/16/2021  ? Superf[place] BRCA negative 09/2018  ? [facility] [place] [facility]; 1/20 MyRisk neg except RPS20 VUS; IBIS=10.8%/riskscore=12%  ? Colon cancer (  [place])   ? Dysautonomia ([place])   ? Ehlers-Danlos syndrome type III   ? Family history of colon cancer   ? GERD (gastroesophageal reflux disease)   ? Hyperlipidemia   ? Idiopathic small fiber peripheral neuropathy   ? Leiomyoma   ? Mast cell activation ([place])   ? Migraines   ? Neck pain   ? Occipital neuralgia   ? Sleep apnea   ? ? ? ?Past Surgical History:  ?Procedure Laterality Date  ? APPENDECTOMY    ? removed with colectomy  ? BREAST SURGERY    ? reduction  ? COLON SURGERY  2008  ? for colon cancer, ~1/3 of colon removed, primary reanastimosis  ? COLONOSCOPY WITH PROPOFOL N/A 10/07/2018  ? Procedure: COLONOSCOPY WITH PROPOFOL;  Surgeon: Lin Landsman, MD;  Location[facility] ENDOSCOPY;  Service: Gastroenterology;  Laterality: N/A;  ? COLONOSCOPY WITH PROPOFOL N/A 08/14/2021  ? Procedure: COLONOSCOPY WITH PROPOFOL;  Surgeon: Lin Landsman, MD;  Location[facility]COPY;  Service: Gastroenterology;  Laterality: N/A;  ? ENDOMETRIAL ABLATION    ? trigeminal stimulator    ? ? ?Social History   ? ?Socioeconomic History  ? Marital status: Married  ?  Spouse name: Not on file  ? Number of children: 0  ? Years of education: Not on file  ? Highest education level: Not on file  ?Occupational History  ? [employer]

## 2021-12-31 ENCOUNTER — Ambulatory Visit (INDEPENDENT_AMBULATORY_CARE_PROVIDER_SITE_OTHER): Payer: Medicare Other | Admitting: Dermatology

## 2021-12-31 ENCOUNTER — Encounter: Payer: Self-pay | Admitting: *Deleted

## 2021-12-31 DIAGNOSIS — D2271 Melanocytic nevi of right lower limb, including hip: Secondary | ICD-10-CM

## 2021-12-31 DIAGNOSIS — L814 Other melanin hyperpigmentation: Secondary | ICD-10-CM

## 2021-12-31 DIAGNOSIS — D1800 Hemangioma unspecified site: Secondary | ICD-10-CM

## 2021-12-31 DIAGNOSIS — L578 Other skin changes due to chronic exposure to nonionizing radiation: Secondary | ICD-10-CM

## 2021-12-31 DIAGNOSIS — L409 Psoriasis, unspecified: Secondary | ICD-10-CM

## 2021-12-31 DIAGNOSIS — L853 Xerosis cutis: Secondary | ICD-10-CM

## 2021-12-31 DIAGNOSIS — D2239 Melanocytic nevi of other parts of face: Secondary | ICD-10-CM

## 2021-12-31 DIAGNOSIS — Z85828 Personal history of other malignant neoplasm of skin: Secondary | ICD-10-CM

## 2021-12-31 DIAGNOSIS — L821 Other seborrheic keratosis: Secondary | ICD-10-CM

## 2021-12-31 DIAGNOSIS — D2272 Melanocytic nevi of left lower limb, including hip: Secondary | ICD-10-CM

## 2021-12-31 DIAGNOSIS — Z1283 Encounter for screening for malignant neoplasm of skin: Secondary | ICD-10-CM | POA: Diagnosis not present

## 2021-12-31 DIAGNOSIS — L738 Other specified follicular disorders: Secondary | ICD-10-CM

## 2021-12-31 DIAGNOSIS — D229 Melanocytic nevi, unspecified: Secondary | ICD-10-CM

## 2021-12-31 NOTE — Patient Instructions (Addendum)
? ? ? ? ?For Feet , legs, and arms or any dry areas of body  ? ?Recommend starting moisturizer with exfoliant (Urea, Salicylic acid, or Lactic acid) one to two times daily to help smooth rough and bumpy skin.  OTC options include Cetaphil Rough and Bumpy lotion (Urea), Eucerin Roughness Relief lotion or spot treatment cream (Urea), CeraVe SA lotion/cream for Rough and Bumpy skin (Sal Acid), Gold Bond Rough and Bumpy cream (Sal Acid), and AmLactin 12% lotion/cream (Lactic Acid).  If applying in morning, also apply sunscreen to sun-exposed areas, since these exfoliating moisturizers can increase sensitivity to sun. ? ?Gentle Skin Care Guide ? ?1. Bathe no more than once a day. ? ?2. Avoid bathing in hot water ? ?3. Use a mild soap like Dove, Vanicream, Cetaphil, CeraVe. Can use Lever 2000 or Cetaphil antibacterial soap ? ?4. Use soap only where you need it. On most days, use it under your arms, between your legs, and on your feet. Let the water rinse other areas unless visibly dirty. ? ?5. When you get out of the bath/shower, use a towel to gently blot your skin dry, don't rub it. ? ?6. While your skin is still a little damp, apply a moisturizing cream such as Vanicream, CeraVe, Cetaphil, Eucerin, Sarna lotion or plain Vaseline Jelly. For hands apply Neutrogena [place] Hand Cream or Excipial Hand Cream. ? ?7. Reapply moisturizer any time you start to itch or feel dry. ? ?8. Sometimes using free and clear laundry detergents can be helpful. Fabric softener sheets should be avoided. Downy Free & Gentle liquid, or any liquid fabric softener that is free of dyes and perfumes, it acceptable to use ? ?9. If your doctor has given you prescription creams you may apply moisturizers over them  ? ? ? ? ? ?Melanoma ABCDEs ? ?Melanoma is the most dangerous type of skin cancer, and is the leading cause of death from skin disease.  You are more likely to develop melanoma if you: ?Have light-colored skin, light-colored eyes, or red  or blond hair ?Spend a lot of time in the sun ?Tan regularly, either outdoors or in a tanning bed ?Have had blistering sunburns, especially during childhood ?Have a close family member who has had a melanoma ?Have atypical moles or large birthmarks ? ?Early detection of melanoma is key since treatment is typically straightforward and cure rates are extremely high if we catch it early.  ? ?The first sign of melanoma is often a change in a mole or a new dark spot.  The ABCDE system is a way of remembering the signs of melanoma. ? ?A for asymmetry:  The two halves do not match. ?B for border:  The edges of the growth are irregular. ?C for color:  A mixture of colors are present instead of an even brown color. ?D for diameter:  Melanomas are usually (but not always) greater than 60m - the size of a pencil eraser. ?E for evolution:  The spot keeps changing in size, shape, and color. ? ?Please check your skin once per month between visits. You can use a small mirror in front and a large mirror behind you to keep an eye on the back side or your body.  ? ?If you see any new or changing lesions before your next follow-up, please call to schedule a visit. ? ?Please continue daily skin protection including broad spectrum sunscreen SPF 30+ to sun-exposed areas, reapplying every 2 hours as needed when you're outdoors.  ? ?Staying  in the shade or wearing long sleeves, sun glasses (UVA+UVB protection) and wide brim hats (4-inch brim around the entire circumference of the hat) are also recommended for sun protection.   ? ?If You Need Anything After Your Visit ? ?If you have any questions or concerns for your doctor, please call our main line at [phone] and press option 4 to reach your doctor's medical assistant. If no one answers, please leave a voicemail as directed and we will return your call as soon as possible. Messages left after 4 pm will be answered the following business day.  ? ?You may also send [place] a message via  MyChart. We typically respond to MyChart messages within 1-2 business days. ? ?For prescription refills, please ask your pharmacy to contact our office. Our fax number is [phone]. ? ?If you have an urgent issue when the clinic is closed that cannot wait until the next business day, you can page your doctor at the number below.   ? ?Please note that while we do our best to be available for urgent issues outside of office hours, we are not available 24/7.  ? ?If you have an urgent issue and are unable to reach [place], you may choose to seek medical care at your doctor's office, retail clinic, urgent care center, or emergency room. ? ?If you have a medical emergency, please immediately call 911 or go to the emergency department. ? ?Pager Numbers ? ?- Dr. Nehemiah Massed: [phone] ? ?- Dr. Laurence Ferrari: [phone] ? ?- Dr. Nicole Kindred: [phone] ? ?In the event of inclement weather, please call our main line at [phone] for an update on the status of any delays or closures. ? ?Dermatology Medication Tips: ?Please keep the boxes that topical medications come in in order to help keep track of the instructions about where and how to use these. Pharmacies typically print the medication instructions only on the boxes and not directly on the medication tubes.  ? ?If your medication is too expensive, please contact our office at [phone] option 4 or send [place] a message through [place].  ? ?We are unable to tell what your co-pay for medications will be in advance as this is different depending on your insurance coverage. However, we may be able to find a substitute medication at lower cost or fill out paperwork to get insurance to cover a needed medication.  ? ?If a prior authorization is required to get your medication covered by your insurance company, please allow [place] 1-2 business days to complete this process. ? ?Drug prices often vary depending on where the prescription is filled and some pharmacies may offer cheaper  prices. ? ?The website www.goodrx.com contains coupons for medications through different pharmacies. The prices here do not account for what the cost may be with help from insurance (it may be cheaper with your insurance), but the website can give you the price if you did not use any insurance.  ?- You can print the associated coupon and take it with your prescription to the pharmacy.  ?- You may also stop by our office during regular business hours and pick up a GoodRx coupon card.  ?- If you need your prescription sent electronically to a different pharmacy, notify our office through [facility] or by phone at [phone] option 4. ? ? ? ? ?Si Usted Necesita Algo Despu?s de Su Visita ? ?Tambi?n puede enviarnos un mensaje a trav?s de MyChart. Por lo general respondemos a los mensajes de Therapist, occupational transcurso  de 1 a 2 d?as h?biles. ? ?Para renovar recetas, por favor pida a su farmacia que se ponga en contacto con nuestra oficina. Nuestro n?mero de fax es el [phone]. ? ?Si tiene un asunto urgente cuando la cl?nica est? cerrada y que no puede esperar hasta el siguiente d?a h?bil, puede llamar/localizar a su doctor(a) al n?mero que aparece a continuaci?n.  ? ?Por favor, tenga en cuenta que aunque hacemos todo lo posible para estar disponibles para asuntos urgentes fuera del horario de oficina, no estamos disponibles las 24 horas del d?a, los 7 d?as de la semana.  ? ?Si tiene un problema urgente y no puede comunicarse con nosotros, puede optar por buscar atenci?n m?dica  en el consultorio de su doctor(a), en una cl?nica privada, en un centro de atenci?n urgente o en una sala de emergencias. ? ?Si tiene Engineer, maintenance (IT) m?dica, por favor llame inmediatamente al 911 o vaya a la sala de emergencias. ? ?N?meros de b?per ? ?- Dr. Nehemiah Massed: [phone] ? ?- Dra. Moye: [phone] ? ?- Dra. Nicole Kindred: [phone] ? ?En caso de inclemencias del tiempo, por favor llame a nuestra l?nea principal al [phone]  para una actualizaci?n sobre el estado de cualquier retraso o cierre. ? ?Consejos para la medicaci?n en dermatolog?a: ?Por favor, guarde las cajas en las que vienen los medicamentos de uso t?pico para ayudarle a s

## 2021-12-31 NOTE — Progress Notes (Signed)
? ?New Patient Visit ? ?Subjective  ?Autumn Zhang is a 51 y.o. female who presents for the following: New Patient (Initial Visit) ( Reports history of bcc on right calf removed 6 months. hx of psoriasis bx proven no current treatment, swelling in joints working with rheumatologist,  severe dryness at lower legs, Red spot at side of right thigh occasionally gets irritated and bleeds,  Spot at nose that occasional bleeds,  Flaky dry platches at inner arms that occasionally bleeds when scratches.very dry flaky cracked skin at feet, heel pain.. Patient reports a sharp pain at heel.  ). ? ? ?The patient presents for Total-Body Skin Exam (TBSE) for skin cancer screening and mole check.  The patient has spots, moles and lesions to be evaluated, some may be new or changing and the patient has concerns that these could be cancer. ? ? ?The following portions of the chart were reviewed this encounter and updated as appropriate:  ?  ?  ? ?Review of Systems:  No other skin or systemic complaints except as noted in HPI or Assessment and Plan. ? ?Objective  ?Well appearing patient in no apparent distress; mood and affect are within normal limits. ? ?A full examination was performed including scalp, head, eyes, ears, nose, lips, neck, chest, axillae, abdomen, back, buttocks, bilateral upper extremities, bilateral lower extremities, hands, feet, fingers, toes, fingernails, and toenails. All findings within normal limits unless otherwise noted below. ? ?b/l elbow and scalp ?Mild erythema and scale at elbows , thickening of skin at feet with hyperkeratosis at heels, mild scale scalp, nails are clear ? ?arms. legs, b/l plantar feet, toes, heels ?Focal hyperkeratosis heels , plantar feet and toes, diffuse dry flaky skin on arms/legs ? ?Mid Tip of Nose ?2 mm indistinct light pink macule at mid nasal tip, pt states will swell and bleed off and on  ? ? ? ? ? ? ? ?b/l feet ?Multiple moles at feet see photos  ? ?left lower calf ?3 mm  medium dark speckled macule left lower calf  ? ? ? ? ? ? ? ? ? ? ? ? ? ? ?Assessment & Plan  ?Psoriasis ?b/l elbow and scalp ? ?Mild, with possible associated arthritis ? ?Psoriasis is a chronic non-curable, but treatable genetic/hereditary disease that may have other systemic features affecting other organ systems such as joints (Psoriatic Arthritis). It is associated with an increased risk of inflammatory bowel disease, heart disease, non-alcoholic fatty liver disease, and depression.   ?Bx proven at elbows ? ?Hx of Cosentyx for PsA - no longer taking due to side effects.  Currently seeing rheumatologist for joint swelling/pain ? ?Recommend Vanicream dandruff shampoo with Zinc, use 2 - 3 times weekly, let sit several minutes prior to rinsing ?Start Cetaphil or Eucerin rough and bumpy lotion/cream to aas feet, elbows qd/bid ? ?Xerosis cutis ?arms. legs, b/l plantar feet, toes, heels ? ?Recommend mild soap and moisturizing cream 1-2 times daily.  Gentle skin care handout provided.   ? ?Recommend starting moisturizer with exfoliant (Urea, Salicylic acid, or Lactic acid) one to two times daily to help smooth rough and bumpy skin.  OTC options include Cetaphil Rough and Bumpy lotion (Urea), Eucerin Roughness Relief lotion or spot treatment cream (Urea), CeraVe SA lotion/cream for Rough and Bumpy skin (Sal Acid), Gold Bond Rough and Bumpy cream (Sal Acid), and AmLactin 12% lotion/cream (Lactic Acid).  If applying in morning, also apply sunscreen to sun-exposed areas, since these exfoliating moisturizers can increase sensitivity to sun. ? ? ?  Fibrous papule of nose ?Mid Tip of Nose ? ?Angiofibroma, with hx of irritation  ? ?Photos today ? ?Benign-appearing.  Observation.  Call clinic for new or changing lesions.  Recommend daily use of broad spectrum spf 30+ sunscreen to sun-exposed areas.  Will continue to monitor  ? ?May consider Ln2 or bx in future if grows/changes ? ?Nevus (2) ?b/l feet; left lower  calf ? ?Benign-appearing.  Observation.  Call clinic for new or changing lesions.  Recommend daily use of broad spectrum spf 30+ sunscreen to sun-exposed areas.  ? ?See photos ? ?Lentigines ?- Scattered tan macules ?- Due to sun exposure ?- Benign-appearing, observe ?- Recommend daily broad spectrum sunscreen SPF 30+ to sun-exposed areas, reapply every 2 hours as needed. ?- Call for any changes ? ?Seborrheic Keratoses ?- Stuck-on, waxy, tan-brown papules and/or plaques  ?- Benign-appearing ?- Discussed benign etiology and prognosis. ?- Observe ?- Call for any changes ? ?Sebaceous Hyperplasia ?- Small yellow papules with a central dell at nose  ?- Benign ?- Observe ? ?Melanocytic Nevi ?- Tan-brown and/or pink-flesh-colored symmetric macules and papules ?- Benign appearing on exam today ?- Observation ?- Call clinic for new or changing moles ?- Recommend daily use of broad spectrum spf 30+ sunscreen to sun-exposed areas.  ? ?Xerosis ?- diffuse xerotic patches at legs  ?- recommend gentle, hydrating skin care, can try Vanicream products since she has very sensitive skin. ?- gentle skin care handout given ? ?Hemangiomas ?- Red papules  ?right lower hip  ?- Discussed benign nature ?- Observe ?- Call for any changes ? ?Actinic Damage ?- Chronic condition, secondary to cumulative UV/sun exposure ?- diffuse scaly erythematous macules with underlying dyspigmentation ?- Recommend daily broad spectrum sunscreen SPF 30+ to sun-exposed areas, reapply every 2 hours as needed.  ?- Staying in the shade or wearing long sleeves, sun glasses (UVA+UVB protection) and wide brim hats (4-inch brim around the entire circumference of the hat) are also recommended for sun protection.  ?- Call for new or changing lesions. ? ?History of Basal Cell Carcinoma of the Skin ?- No evidence of recurrence today at right calf 2022 Done by Dr. Keitha Butte ?And chest, and right arm reports hx BCC years ago ?- Recommend regular full body skin exams ?-  Recommend daily broad spectrum sunscreen SPF 30+ to sun-exposed areas, reapply every 2 hours as needed.  ?- Call if any new or changing lesions are noted between office visits ? ?Skin cancer screening performed today. ?Return in about 6 months (around 07/02/2022) for recheck moles and nose. ? ?I, Ruthell Rummage, CMA, am acting as scribe for Brendolyn Patty, MD. ? ?Documentation: I have reviewed the above documentation for accuracy and completeness, and I agree with the above. ? ?Brendolyn Patty MD  ? ? ?

## 2022-01-02 ENCOUNTER — Encounter: Payer: Self-pay | Admitting: Oncology

## 2022-01-03 ENCOUNTER — Inpatient Hospital Stay: Payer: Medicare Other

## 2022-01-03 ENCOUNTER — Other Ambulatory Visit: Payer: Self-pay | Admitting: *Deleted

## 2022-01-10 ENCOUNTER — Ambulatory Visit: Payer: Medicare Other

## 2022-01-13 ENCOUNTER — Encounter: Payer: Self-pay | Admitting: Family Medicine

## 2022-01-16 ENCOUNTER — Encounter: Payer: Self-pay | Admitting: *Deleted

## 2022-01-16 MED FILL — Ferumoxytol Inj 510 MG/17ML (30 MG/ML) (Elemental Fe): INTRAVENOUS | Qty: 17 | Status: AC

## 2022-01-17 ENCOUNTER — Telehealth: Payer: Self-pay

## 2022-01-17 ENCOUNTER — Inpatient Hospital Stay: Payer: No Typology Code available for payment source

## 2022-01-17 NOTE — Telephone Encounter (Signed)
Per Cecille Rubin, at Dr. Lestine Box office we need to fax over a release of information in order to retrieve records from 2008-2012 regarding history of IV irons, any reactions, hematology/oncology notes. Autumn Zhang was able to upload a release of medical information via MyChart and pt was able to sign it and send it back. Faxed requested information this morning to [phone] and have receieved a fax confirmation.  ? ?

## 2022-01-30 ENCOUNTER — Telehealth: Payer: Self-pay

## 2022-01-30 NOTE — Telephone Encounter (Signed)
No full dose. Why 1/2 dose?

## 2022-01-30 NOTE — Telephone Encounter (Signed)
Appt changed and pt notified

## 2022-01-30 NOTE — Telephone Encounter (Signed)
Just iv setroid and benadryl at the time of infusion. If she wants it spread out every 3-4 weeks then that's fine with me

## 2022-01-30 NOTE — Telephone Encounter (Signed)
Copied from [place] [phone]. Topic: Appointment Scheduling - Scheduling Inquiry for Clinic >> Jan 30, 2022 12:46 PM Bayard Beaver wrote: Reason for CRM:Pt called in wanting to change office appt on May 22 to virtual visit. She says its for weight management and discuss Rheumatology appt.Please call back to make sure this is ok before changing.

## 2022-01-30 NOTE — Progress Notes (Signed)
I,[name],acting as a Education administrator for [name], MD.,have documented all relevant documentation on the behalf of [name], MD,as directed by  [name], MD while in the presence of [name], MD.  MyChart Video Visit    Virtual Visit via Video Note   This visit type was conducted due to national recommendations for restrictions regarding the COVID-19 Pandemic (e.g. social distancing) in an effort to limit this patient's exposure and mitigate transmission in our community. This patient is at least at moderate risk for complications without adequate follow up. This format is felt to be most appropriate for this patient at this time. Physical exam was limited by quality of the video and audio technology used for the visit.    Patient location: home Provider location: [place] involved in the visit: patient, provider  I discussed the limitations of evaluation and management by telemedicine and the availability of in person appointments. The patient expressed understanding and agreed to proceed.  Patient: Autumn Zhang   DOB: 09/01/1971   51 y.o. Female  MRN: [id number] Visit Date: 02/03/2022  Today's healthcare provider: [name], MD   Chief Complaint  Patient presents with   Follow-up   Subjective    HPI  Follow up for jaw pain  The patient was last seen for this 3 months ago. Changes made at last visit include no changes.  She reports excellent compliance with treatment. She feels that condition is Unchanged. She is not having side effects.   ----------------------------------------------------------------------------------------- "It has now been 5 months off all therapy and she is again without evidence of disease activity today. Overall, I suspect her joint/muscle symptoms have been related to her hypermobility and early OA. She is in agreement with this. As such, no longer needing to follow with  rheumatology. Did discuss importance of physical therapy and offered referral." - per Rheum appt  She is going to be doing PT at [facility] for her feet. She is using Voltaren gel prn - we will prescribe this prn.  "I've been trying to lose weight.  I'd like to get down to 134 to be at a lower BMI. I've been trying to lose since June of 2022 and have lost a grand total of.4 pounds. I walk about 10,000 steps each day and eat about 1,500 calories per day. (I've been tracking my food in a spreadsheet all this time.)   Could you tell me what you think my calorie range should be for 134 lb?  I've been eating well within the range set by the online calculators I can find (and at the low end of what my nutritionist suggested to lose weight), but that's obviously not cutting it.  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  She is concerned about more than the scale. She is exercising regularly and eating well per nutritionist.  She is unable to lose weight if eating any more than 1500 calories per day.  If she eats 1600 calories, she will gain 3 pounds in a week.  Having central abd weight gain.  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  More steroids in the last few years than previously, but  not on them continuously.  No periods since uterine ablation in 2011.  Unclear when she went through menopause, maybe in 2018 (had some symptoms).  Normal AM cortisol in 10/22.  [place] in postmenopausal range in 2021.  Normal A1c, but feels hungover if she takes in any normal amount of carbs.  Never done a GTT.   Medications: Outpatient Medications Prior to Visit  Medication Sig   acetaminophen (TYLENOL) 500 MG tablet Take 500 mg by mouth every 6 (six) hours as  needed.   albuterol (PROVENTIL HFA;VENTOLIN HFA) 108 (90 Base) MCG/ACT inhaler Inhale 2 puffs into the lungs every 4 (four) hours as needed.    aspirin 325 MG tablet Take 325 mg by mouth.   Atogepant (QULIPTA) 10 MG TABS Take 10 mg by mouth 2 (two) times daily. Patient takes 5 mg in morning and 5 mg at night   augmented betamethasone dipropionate (DIPROLENE-AF) 0.05 % cream APPLY TWICE DAILY AS NEEDED TO THE AFFECTED AREAS FOR 5-7 DAYS FOR ECZEMA.   b complex vitamins capsule Take 1 capsule by mouth daily. 1/4 capsule a day   BELSOMRA 5 MG TABS Take 1 tablet by mouth at bedtime as needed.    Blood Pressure Monitoring (BLOOD PRESSURE CUFF) MISC Take blood pressure as needed for symptoms.   Boswellia Serrata (BOSWELLIA PO) Take 150 mg by mouth every morning.    Calcium Citrate 200 MG TABS Take 600 mg by mouth.   cromolyn (GASTROCROM) 100 MG/5ML solution Take by mouth 4 (four) times daily -  before meals and at bedtime.   Cyanocobalamin (VITAMIN B-12) 1000 MCG SUBL Place under the tongue.   cycloSPORINE (RESTASIS) 0.05 % ophthalmic emulsion Place 1 drop into both eyes 2 (two) times daily.   desloratadine (CLARINEX) 5 MG tablet Take 5 mg by mouth as needed.   diphenhydrAMINE (BENADRYL) 12.5 MG/5ML liquid Take by mouth 4 (four) times daily as needed.   diphenhydrAMINE-Allantoin 2-0.5 % CREA Apply 1 application topically as needed.    docusate sodium (COLACE) 100 MG capsule Take 100 mg by mouth every other day.    econazole nitrate 1 % cream    EPINEPHrine 0.3 mg/0.3 mL IJ SOAJ injection INJECT ONE AUTO-INJECTOR INTRAMUSCULARLY AS NEEDED   estradiol (ESTRACE) 0.1 MG/GM vaginal cream INSERT 1 GRAM VAGINALLY TWICE WEEKLY   famotidine (PEPCID) 10 MG tablet Take 10 mg by mouth daily.    fexofenadine (ALLEGRA) 180 MG tablet Take 180 mg by mouth 2 (two) times daily.   guaifenesin (HUMIBID E) 400 MG TABS tablet Take 400 mg by mouth as needed.   hydrOXYzine (VISTARIL) 25 MG capsule Take by mouth. 25-50 MG  PRN   IODINE EX Apply 225 mcg topically. Every 3 days   KETAMINE HCL SL Place into right nostril as needed.   ketotifen (ZADITOR) 0.025 % ophthalmic solution Place 1 drop into both eyes daily as needed.   Ketotifen Fumarate POWD 1 mg by Does not apply route. 5-6 times a day   L-Lysine 500 MG CAPS Take by mouth 3 (three) times daily.   L-Theanine 100 MG CAPS Take 200 mg by mouth daily.    levocetirizine (XYZAL) 2.5 MG/5ML solution as needed.   Light Mineral Oil-Mineral Oil 0.5-0.5 % EMUL Apply 1 application to eye daily.   lipase/protease/amylase (CREON) 12000-38000 units CPEP capsule Take 12,000 Units by mouth as directed.   loperamide (IMODIUM) 2 MG capsule Take by mouth as needed for diarrhea or loose stools.   Lutein-Zeaxanthin 25-5 MG CAPS Take 1  capsule by mouth every other day.   Magnesium Citrate POWD Take 133 mg by mouth daily.   Melatonin 1 MG/ML LIQD Take by mouth daily.   montelukast (SINGULAIR) 5 MG chewable tablet Chew 5 mg by mouth in the morning and at bedtime.   Non Gelatin Capsules, Empty, (CAPSULE #3 CLEAR/CLEAR VEG) CAPS Take 1 capsule by mouth daily.   NONFORMULARY OR COMPOUNDED ITEM Bupivacaine nasal 0.5% drops w/o epi   omega-3 acid ethyl esters (LOVAZA) 1 g capsule Take 2 capsules (2 g total) by mouth 2 (two) times daily.   pseudoephedrine (SUDAFED) 30 MG tablet Take 30 mg by mouth every 4 (four) hours as needed for congestion.    pyridostigmine (MESTINON) 60 MG tablet Take 15 mg by mouth. 3-4 times a day   Quercetin 500 MG CAPS Take by mouth daily.   Riboflavin (VITAMIN B-2 PO) Take 100 mg by mouth 3 (three) times daily.   Rimegepant Sulfate (NURTEC) 75 MG TBDP Take 1 tablet by mouth as needed.   Selenium 200 MCG CAPS Take 1 capsule by mouth once a week.   simethicone (MYLICON) 80 MG chewable tablet Chew 80 mg by mouth every 6 (six) hours as needed for flatulence.   Sodium Fluoride 1.1 % PSTE Place onto teeth.   TRIAMCINOLONE ACETONIDE,NASAL, NA Place into the nose  as needed.   trolamine salicylate (ASPERCREME/ALOE) 10 % cream Apply topically.   vitamin C (ASCORBIC ACID) 500 MG tablet Take 500 mg by mouth daily.   Vitamin D, Cholecalciferol, 10 MCG (400 UNIT) TABS Take by mouth daily.    No facility-administered medications prior to visit.    Review of Systems Per HPI  Last CBC Lab Results  Component Value Date   WBC 9.5 12/25/2021   HGB 13.0 12/25/2021   HCT 40.3 12/25/2021   MCV 92.4 12/25/2021   MCH 29.8 12/25/2021   RDW 12.0 12/25/2021   PLT 320 03/55/9741   Last metabolic panel Lab Results  Component Value Date   GLUCOSE 103 (H) 07/02/2021   NA 140 07/02/2021   K 3.8 07/02/2021   CL 106 07/02/2021   CO2 28 07/02/2021   BUN 16 07/02/2021   CREATININE 0.62 07/02/2021   GFRNONAA >60 07/02/2021   CALCIUM 9.5 07/02/2021   PROT 7.6 07/02/2021   ALBUMIN 4.3 07/02/2021   LABGLOB 2.1 06/18/2021   AGRATIO 2.2 06/18/2021   BILITOT 0.7 07/02/2021   ALKPHOS 90 07/02/2021   AST 23 07/02/2021   ALT 18 07/02/2021   ANIONGAP 6 07/02/2021   Last lipids Lab Results  Component Value Date   CHOL 188 06/18/2021   HDL 72 06/18/2021   LDLCALC 105 (H) 06/18/2021   TRIG 59 06/18/2021   CHOLHDL 2.6 06/18/2021   Last hemoglobin A1c Lab Results  Component Value Date   HGBA1C 5.1 06/18/2021   Last thyroid functions Lab Results  Component Value Date   TSH 1.770 06/18/2021       Objective    There were no vitals taken for this visit.      Physical Exam Constitutional:      General: She is not in acute distress.    Appearance: Normal appearance.  HENT:     Head: Normocephalic.  Pulmonary:     Effort: Pulmonary effort is normal. No respiratory distress.  Neurological:     Mental Status: She is alert and oriented to person, place, and time. Mental status is at baseline.       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 Out of the acute phase that was associated with dysphagia and dysarthria previously Continue to follow with current specialists       Polyarthralgia    Has previously been followed by rheumatology, but as of their last visit, they do not believe she needs to continue with rheumatology She has not been on any DMARDs in the last 5 to 6 months and is not showing any signs of psoriatic arthritis as this was previously thought to be It is now thought to be early OA and downstream effects of hypermobility We will continue to monitor       Abnormal weight gain    See plan above       Relevant Orders   Cortisol, urine, free   Central obesity    Despite not significant elevation in weight for her height, patient is getting more central obesity of the abdomen She has had a normal random cortisol level previously, but she does have 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     I discussed the assessment and treatment plan with the patient. The patient was provided an opportunity to ask questions and all were answered. The patient agreed with the plan and demonstrated  an understanding of the instructions.   The patient was advised to call back or seek an in-person evaluation if the symptoms worsen or if the condition fails to improve as anticipated.  I, [name], MD, have reviewed all documentation for this visit. The documentation on 02/03/22 for the exam, diagnosis, procedures, and orders are all accurate and complete.   March Joos, Dionne Bucy, MD, MPH [facility]

## 2022-01-30 NOTE — Telephone Encounter (Signed)
Sure. Virtual is fine. Please change visit type.

## 2022-01-31 ENCOUNTER — Other Ambulatory Visit: Payer: Self-pay | Admitting: Oncology

## 2022-01-31 NOTE — Telephone Encounter (Signed)
Lets just do full dose if she is ok 2-3 weeks apart

## 2022-02-03 ENCOUNTER — Encounter: Payer: Self-pay | Admitting: Family Medicine

## 2022-02-03 ENCOUNTER — Telehealth (INDEPENDENT_AMBULATORY_CARE_PROVIDER_SITE_OTHER): Payer: Medicare Other | Admitting: Family Medicine

## 2022-02-03 DIAGNOSIS — R635 Abnormal weight gain: Secondary | ICD-10-CM | POA: Insufficient documentation

## 2022-02-03 DIAGNOSIS — R6884 Jaw pain: Secondary | ICD-10-CM

## 2022-02-03 DIAGNOSIS — M2550 Pain in unspecified joint: Secondary | ICD-10-CM

## 2022-02-03 DIAGNOSIS — E65 Localized adiposity: Secondary | ICD-10-CM | POA: Insufficient documentation

## 2022-02-03 DIAGNOSIS — R7301 Impaired fasting glucose: Secondary | ICD-10-CM | POA: Insufficient documentation

## 2022-02-03 NOTE — Assessment & Plan Note (Signed)
Though patient has had normal A1c, she reports very low carb diet and feeling bad after eating carb load Discussed possibility of GTT and she will reach out to her GYN to see if she could do this at their office as they carry glucola - will send copy of note to GYN office

## 2022-02-03 NOTE — Assessment & Plan Note (Signed)
See plan above.

## 2022-02-03 NOTE — Assessment & Plan Note (Signed)
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

## 2022-02-03 NOTE — Assessment & Plan Note (Signed)
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

## 2022-02-03 NOTE — Assessment & Plan Note (Signed)
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

## 2022-02-04 ENCOUNTER — Encounter: Payer: Self-pay | Admitting: Family Medicine

## 2022-02-04 ENCOUNTER — Encounter: Payer: Self-pay | Admitting: Obstetrics and Gynecology

## 2022-02-04 DIAGNOSIS — Z131 Encounter for screening for diabetes mellitus: Secondary | ICD-10-CM

## 2022-02-05 ENCOUNTER — Telehealth: Payer: Self-pay

## 2022-02-05 ENCOUNTER — Other Ambulatory Visit: Payer: Self-pay

## 2022-02-05 DIAGNOSIS — R7301 Impaired fasting glucose: Secondary | ICD-10-CM

## 2022-02-05 DIAGNOSIS — E65 Localized adiposity: Secondary | ICD-10-CM

## 2022-02-05 DIAGNOSIS — R635 Abnormal weight gain: Secondary | ICD-10-CM

## 2022-02-05 NOTE — Telephone Encounter (Signed)
Patients husband came in today and spoke to Mr. Palacios.

## 2022-02-05 NOTE — Telephone Encounter (Signed)
Copied from [place] [id number]. Topic: General - Other >> Feb 05, 2022 11:02 AM Bayard Beaver wrote: Reason for CRM: pt called in wanted to know if she can pick up another 24hr urine bottle. She needs a second one. >> Feb 05, 2022  2:11 PM Pawlus, Brayton Layman A wrote: Pt made an additional call regarding picking up a urine test bottle.

## 2022-02-06 ENCOUNTER — Inpatient Hospital Stay: Payer: Medicare Other | Attending: Oncology

## 2022-02-06 ENCOUNTER — Telehealth: Payer: Self-pay

## 2022-02-06 VITALS — BP 102/70 | HR 68 | Temp 99.0°F | Resp 17

## 2022-02-06 DIAGNOSIS — D509 Iron deficiency anemia, unspecified: Secondary | ICD-10-CM | POA: Diagnosis not present

## 2022-02-06 DIAGNOSIS — Z862 Personal history of diseases of the blood and blood-forming organs and certain disorders involving the immune mechanism: Secondary | ICD-10-CM

## 2022-02-06 MED ORDER — ACETAMINOPHEN 325 MG PO TABS
650.0000 mg | ORAL_TABLET | Freq: Once | ORAL | Status: AC
  Administered 2022-02-06: 650 mg via ORAL
  Filled 2022-02-06: qty 2

## 2022-02-06 MED ORDER — SODIUM CHLORIDE 0.9 % IV SOLN
125.0000 mg | INTRAVENOUS | Status: DC
  Administered 2022-02-06: 125 mg via INTRAVENOUS
  Filled 2022-02-06: qty 10

## 2022-02-06 MED ORDER — SODIUM CHLORIDE 0.9 % IV SOLN
Freq: Once | INTRAVENOUS | Status: AC
  Filled 2022-02-06: qty 250

## 2022-02-06 MED ORDER — DIPHENHYDRAMINE HCL 50 MG/ML IJ SOLN
50.0000 mg | Freq: Once | INTRAMUSCULAR | Status: AC
  Administered 2022-02-06: 50 mg via INTRAVENOUS
  Filled 2022-02-06: qty 1

## 2022-02-06 MED ORDER — METHYLPREDNISOLONE SODIUM SUCC 125 MG IJ SOLR
40.0000 mg | Freq: Once | INTRAMUSCULAR | Status: AC
  Administered 2022-02-06: 40 mg via INTRAVENOUS
  Filled 2022-02-06: qty 2

## 2022-02-06 NOTE — Patient Instructions (Signed)
Sodium Ferric Gluconate Complex Injection What is this medication? SODIUM FERRIC GLUCONATE COMPLEX (SOE dee um FER ik GLOO koe nate KOM pleks) treats low levels of iron (iron deficiency anemia) in people with kidney disease. Iron is a mineral that plays an important role in making red blood cells, which carry oxygen from your lungs to the rest of your body. This medicine may be used for other purposes; ask your health care provider or pharmacist if you have questions. COMMON BRAND NAME(S): Ferrlecit, Nulecit What should I tell my care team before I take this medication? They need to know if you have any of the following conditions: Anemia that is not from iron deficiency High levels of iron in the blood An unusual or allergic reaction to iron, other medications, foods, dyes, or preservatives Pregnant or are trying to become pregnant Breast-feeding How should I use this medication? This medication is injected into a vein. It is given by your care team in a hospital or clinic setting. Talk to your care team about the use of this medication in children. While it may be prescribed for children as young as 6 years for selected conditions, precautions do apply. Overdosage: If you think you have taken too much of this medicine contact a poison control center or emergency room at once. NOTE: This medicine is only for you. Do not share this medicine with others. What if I miss a dose? It is important not to miss your dose. Call your care team if you are unable to keep an appointment. What may interact with this medication? Do not take this medication with any of the following: Deferasirox Deferoxamine Dimercaprol This medication may also interact with the following: Other iron products This list may not describe all possible interactions. Give your health care provider a list of all the medicines, herbs, non-prescription drugs, or dietary supplements you use. Also tell them if you smoke, drink  alcohol, or use illegal drugs. Some items may interact with your medicine. What should I watch for while using this medication? Your condition will be monitored carefully while you are receiving this medication. Visit your care team for regular checks on your progress. You may need blood work while you are taking this medication. What side effects may I notice from receiving this medication? Side effects that you should report to your care team as soon as possible: Allergic reactions--skin rash, itching, hives, swelling of the face, lips, tongue, or throat Low blood pressure--dizziness, feeling faint or lightheaded, blurry vision Shortness of breath Side effects that usually do not require medical attention (report to your care team if they continue or are bothersome): Flushing Headache Joint pain Muscle pain Nausea Pain, redness, or irritation at injection site This list may not describe all possible side effects. Call your doctor for medical advice about side effects. You may report side effects to FDA at 1-800-FDA-1088. Where should I keep my medication? This medication is given in a hospital or clinic and will not be stored at home. NOTE: This sheet is a summary. It may not cover all possible information. If you have questions about this medicine, talk to your doctor, pharmacist, or health care provider.  2023 Elsevier/Gold Standard (2021-01-25 00:00:00)  

## 2022-02-06 NOTE — Progress Notes (Signed)
0840: Per Dr. Janese Banks, Give Ferrlecit over 2 hours due to previous reactions to iron products. Ordered changed **see MAR**  1131: Pt tolerated infusion well. Pt and VS stable at discharge.   1155: Pt and VS stable at discharge.

## 2022-02-11 ENCOUNTER — Other Ambulatory Visit: Payer: Medicare Other

## 2022-02-11 DIAGNOSIS — Z131 Encounter for screening for diabetes mellitus: Secondary | ICD-10-CM

## 2022-02-12 LAB — GESTATIONAL GLUCOSE TOLERANCE
Glucose, Fasting: 101 mg/dL — ABNORMAL HIGH (ref 70–94)
Glucose, GTT - 1 Hour: 179 mg/dL (ref 70–179)
Glucose, GTT - 2 Hour: 105 mg/dL (ref 70–154)
Glucose, GTT - 3 Hour: 107 mg/dL (ref 70–139)

## 2022-02-12 NOTE — Progress Notes (Signed)
Here are the results from the 3 hr GTT per your request.  Autumn Zhang

## 2022-02-13 ENCOUNTER — Encounter: Payer: Self-pay | Admitting: Family Medicine

## 2022-02-13 LAB — CORTISOL, URINE, FREE
Cortisol (Ur), Free: 61 ug/24 hr — ABNORMAL HIGH (ref 6–42)
Cortisol,F,ug/L,U: 17 ug/L

## 2022-02-14 ENCOUNTER — Other Ambulatory Visit: Payer: Self-pay

## 2022-02-14 DIAGNOSIS — R7989 Other specified abnormal findings of blood chemistry: Secondary | ICD-10-CM

## 2022-02-14 NOTE — Progress Notes (Signed)
Patient request referral to endocrine.

## 2022-02-14 NOTE — Telephone Encounter (Signed)
Please place referral per result note.

## 2022-02-20 ENCOUNTER — Ambulatory Visit: Payer: Medicare Other

## 2022-02-23 ENCOUNTER — Encounter: Payer: Self-pay | Admitting: Psychiatry

## 2022-02-23 ENCOUNTER — Encounter: Payer: Self-pay | Admitting: Oncology

## 2022-02-24 ENCOUNTER — Encounter: Payer: Self-pay | Admitting: Psychiatry

## 2022-02-24 ENCOUNTER — Telehealth (INDEPENDENT_AMBULATORY_CARE_PROVIDER_SITE_OTHER): Payer: Medicare Other | Admitting: Psychiatry

## 2022-02-24 ENCOUNTER — Telehealth: Payer: Self-pay | Admitting: *Deleted

## 2022-02-24 DIAGNOSIS — G43119 Migraine with aura, intractable, without status migrainosus: Secondary | ICD-10-CM | POA: Diagnosis not present

## 2022-02-24 MED ORDER — UBRELVY 50 MG PO TABS: 50.0000 mg | ORAL_TABLET | ORAL | 6 refills | Status: DC | PRN

## 2022-02-24 MED ORDER — QULIPTA 10 MG PO TABS: ORAL_TABLET | ORAL | 6 refills | Status: DC

## 2022-02-24 NOTE — Telephone Encounter (Signed)
Documents attached, PA sent to plan.  If [employer] has not responded to your request within 24 hours, contact [place] at [phone]

## 2022-02-24 NOTE — Progress Notes (Signed)
   Virtual Visit via Video Note  I connected with Autumn Zhang on 02/24/22 at  9:00 AM EDT by a video enabled telemedicine application and verified that I am speaking with the correct person using two identifiers.  Location: Patient: Home Provider: Office   CC:  headaches  Follow-up Visit  Last visit: 10/22/21  Brief HPI: 51 year old female with a history of POTS, IBS, Mast cell activation syndrome, TMJ, Ehler's danlos syndrome who follows in clinic for migraines and facial pain.  At her last visit she was started on Qulipta for headache prevention.  Interval History: Headaches have improved since started [place]. She only has a severe migraine ~once per month. Has lower level headaches constantly, but pain is not as bothersome as the light sensitivity and nausea. She takes Tylenol as needed which does help somewhat.   Headache days per month: 30 Headache free days per month: 0   Current Headache Regimen: Preventative: Qulipta 10 mg PRN Abortive: Tylenol  Prior Therapies                                  Botox Emgality Qulipta Amitriptyline Lyrica Gabapentin Vimpat Maxalt Nurtec Ubrelvy Ketamine nasal spray Liquid lidocaine SPG block Occipital nerve block  Physical Exam:   GENERAL:  well appearing, in no acute distress, alert  SKIN:  Color, texture, turgor normal. No rashes or lesions HEAD:  Normocephalic/atraumatic.  NEUROLOGICAL: Mental Status: Alert, oriented to person, place and time, Follows commands, and Speech fluent and appropriate. Cranial Nerves: PERRL, face symmetric, no dysarthria, hearing grossly intact Motor: moves all extremities equally Gait: normal-based.  IMPRESSION: 51 year old female with a history of POTS, IBS, Mast cell activation syndrome, TMJ, Ehler's danlos syndrome who follows in clinic for migraines. She has had a decrease in severe migraines on Qulipta 10 mg daily but continues to have frequent lower level headaches. Will slowly  uptitrate Qulipta dose. Counseled that she can take a home migraine cocktail of benadryl, tylenol, magnesium, and an anti-emetic as needed. Will prescribe [name] to take as needed for her monthly severe migraine.  PLAN: -Prevention: Increase Qulipta to 15 mg daily x 1 week, then 20 mg daily -Rescue: Tylenol, magnesium, benadryl, and anti-emetic. She will look up compazine and let me know if she would like to try it. Restart Ubrelvy 50 mg PRN for severe migraine -Next steps: consider propranolol or topamax for migraine prevention  Follow-up: 5 months  I spent a total of 32 minutes on the date of the service. Headache education was done.  Discussed treatment options including preventive and acute medications, and natural supplements. Discussed medication overuse headache and to limit use of acute treatments to no more than 2 days/week or 10 days/month. Discussed medication side effects, adverse reactions and drug interactions. Written educational materials and patient instructions outlining all of the above were given.  Genia Harold, MD 02/24/22 9:22 AM

## 2022-02-24 NOTE — Telephone Encounter (Signed)
Autumn Zhang is the correct med, thanks

## 2022-02-24 NOTE — Patient Instructions (Signed)
-  Increase Qulipta to 15 mg (1.5 pills) daily. If you tolerate this well then can increase to 20 mg (2 pills) daily -Can take Tylenol, magnesium, benadryl, and anti-nausea medicine as needed for migraines. Let me know if you would like to try compazine -Take Ubrelvy 50 mg as needed for severe migraine

## 2022-02-24 NOTE — Telephone Encounter (Signed)
[name] PA, [employer]Keene Breath, Bailee.Maki. faxed office notes to be attached to key. Per CMM, does appear she must try/fail 2 triptans. She had side effects form rizatriptan.

## 2022-02-24 NOTE — Telephone Encounter (Signed)
Qulipta PA< Key: Windy Canny  [employer] typically takes 5-10 minutes to respond, but it may take a little longer in some cases. You will be notified by email when available. You can also check for an update later by opening this request from your dashboard. Please do not fax or call [employer] to resubmit this request. If you need assistance, call us at [phone]. If it has been longer than 24 hours, please reach out to [place].

## 2022-02-25 ENCOUNTER — Other Ambulatory Visit: Payer: Self-pay | Admitting: Psychiatry

## 2022-02-25 ENCOUNTER — Encounter: Payer: Self-pay | Admitting: Psychiatry

## 2022-02-25 MED ORDER — QULIPTA 30 MG PO TABS: 15.0000 mg | ORAL_TABLET | Freq: Every day | ORAL | 6 refills | Status: DC

## 2022-02-25 NOTE — Telephone Encounter (Signed)
Received questions on CMM for Qulipta, answered questions and faxed office notes to be attached.

## 2022-02-25 NOTE — Telephone Encounter (Signed)
[name] denied: Your plan only covers this drug if you have tried other drugs and they did not work well for you. We have denied your request because A) You have not tried two triptan 5-HT1 receptor agonists, and B) You do not have a medical reason not to take them. Sent to MD.

## 2022-02-25 NOTE — Telephone Encounter (Signed)
Qulipta denied: Why your request was denied: *Quantity Limits- Your request for additional quantities was denied because your pharmacy benefit plan does not cover additional quantities of the requested drug.  Appeal the decision: You are the only person who has a right to request an appeal. Parties acting on your behalf, such as medical providers, must include a copy of your specific written consent with the review request. ADDITIONAL ACTIONS YOUR DOCTOR CAN TAKE There are also things your doctor can do to address the coverage denial.  Talk to a clinical reviewer about this decision Your doctor can call [facility], and we can arrange to have them speak with a clinical reviewer. Sent to MD.

## 2022-02-25 NOTE — Telephone Encounter (Signed)
I sent an rx for 30 mg tablets, she can take a half pill daily for a total of 15 mg daily

## 2022-02-25 NOTE — Telephone Encounter (Signed)
She was taking Ubrelvy already when she came to me so I'm not sure why it would be denied now. I sent her a message for clarification

## 2022-02-25 NOTE — Telephone Encounter (Signed)
Can we appeal by listing triptans as an allergy with the side effects she listed in the mychart message? We can try adding the reference to the paper in the message as well. Thanks

## 2022-02-26 ENCOUNTER — Encounter: Payer: Self-pay | Admitting: *Deleted

## 2022-02-26 ENCOUNTER — Inpatient Hospital Stay: Payer: Medicare Other | Attending: Oncology

## 2022-02-26 VITALS — BP 98/58 | HR 68 | Temp 98.0°F | Resp 19

## 2022-02-26 DIAGNOSIS — D509 Iron deficiency anemia, unspecified: Secondary | ICD-10-CM | POA: Diagnosis present

## 2022-02-26 DIAGNOSIS — Z862 Personal history of diseases of the blood and blood-forming organs and certain disorders involving the immune mechanism: Secondary | ICD-10-CM

## 2022-02-26 MED ORDER — SODIUM CHLORIDE 0.9 % IV SOLN
Freq: Once | INTRAVENOUS | Status: AC
  Filled 2022-02-26: qty 250

## 2022-02-26 MED ORDER — ACETAMINOPHEN 325 MG PO TABS
650.0000 mg | ORAL_TABLET | Freq: Once | ORAL | Status: AC
  Administered 2022-02-26: 650 mg via ORAL
  Filled 2022-02-26: qty 2

## 2022-02-26 MED ORDER — SODIUM CHLORIDE 0.9 % IV SOLN
125.0000 mg | INTRAVENOUS | Status: DC
  Administered 2022-02-26: 125 mg via INTRAVENOUS
  Filled 2022-02-26: qty 10

## 2022-02-26 MED ORDER — DIPHENHYDRAMINE HCL 50 MG/ML IJ SOLN
50.0000 mg | Freq: Once | INTRAMUSCULAR | Status: AC
  Administered 2022-02-26: 50 mg via INTRAVENOUS
  Filled 2022-02-26: qty 1

## 2022-02-26 NOTE — Telephone Encounter (Signed)
Faxed Autumn Zhang appeal letter, office notes and patient's authorized representative form to [place], [phone]. Per denial letter, appeals are reviewed within 15 days after received.

## 2022-02-26 NOTE — Progress Notes (Signed)
Per pt she does not want to take the solumedrol prior to Ferrlecit infusion today. MD made aware. Per MD okay to proceed with tylenol 650 mg PO and benadryl 50 mg IV prior to infusion. Per MD to give Ferrlecit over 2 hours due to previous reactions to iron products. Ferrlecit completed at 1133. Pt tolerated infusion well with no complications. RN educated pt on the importance of notifying the clinic if any complications occur at home. Pt verbalized understanding. VSS. Pt stable at discharge.   Autumn Zhang [employer]

## 2022-02-26 NOTE — Patient Instructions (Signed)
Sodium Ferric Gluconate Complex Injection What is this medication? SODIUM FERRIC GLUCONATE COMPLEX (SOE dee um FER ik GLOO koe nate KOM pleks) treats low levels of iron (iron deficiency anemia) in people with kidney disease. Iron is a mineral that plays an important role in making red blood cells, which carry oxygen from your lungs to the rest of your body. This medicine may be used for other purposes; ask your health care provider or pharmacist if you have questions. COMMON BRAND NAME(S): Ferrlecit, Nulecit What should I tell my care team before I take this medication? They need to know if you have any of the following conditions: Anemia that is not from iron deficiency High levels of iron in the blood An unusual or allergic reaction to iron, other medications, foods, dyes, or preservatives Pregnant or are trying to become pregnant Breast-feeding How should I use this medication? This medication is injected into a vein. It is given by your care team in a hospital or clinic setting. Talk to your care team about the use of this medication in children. While it may be prescribed for children as young as 6 years for selected conditions, precautions do apply. Overdosage: If you think you have taken too much of this medicine contact a poison control center or emergency room at once. NOTE: This medicine is only for you. Do not share this medicine with others. What if I miss a dose? It is important not to miss your dose. Call your care team if you are unable to keep an appointment. What may interact with this medication? Do not take this medication with any of the following: Deferasirox Deferoxamine Dimercaprol This medication may also interact with the following: Other iron products This list may not describe all possible interactions. Give your health care provider a list of all the medicines, herbs, non-prescription drugs, or dietary supplements you use. Also tell them if you smoke, drink  alcohol, or use illegal drugs. Some items may interact with your medicine. What should I watch for while using this medication? Your condition will be monitored carefully while you are receiving this medication. Visit your care team for regular checks on your progress. You may need blood work while you are taking this medication. What side effects may I notice from receiving this medication? Side effects that you should report to your care team as soon as possible: Allergic reactions--skin rash, itching, hives, swelling of the face, lips, tongue, or throat Low blood pressure--dizziness, feeling faint or lightheaded, blurry vision Shortness of breath Side effects that usually do not require medical attention (report to your care team if they continue or are bothersome): Flushing Headache Joint pain Muscle pain Nausea Pain, redness, or irritation at injection site This list may not describe all possible side effects. Call your doctor for medical advice about side effects. You may report side effects to FDA at 1-800-FDA-1088. Where should I keep my medication? This medication is given in a hospital or clinic and will not be stored at home. NOTE: This sheet is a summary. It may not cover all possible information. If you have questions about this medicine, talk to your doctor, pharmacist, or health care provider.  2023 Elsevier/Gold Standard (2021-01-25 00:00:00)  

## 2022-03-06 NOTE — Telephone Encounter (Signed)
Received fax from [facility]: Ubrelvy approved 03/05/22 to 03/06/23. Approval letter faxed to pharmacy, sent patient my chart.

## 2022-03-10 ENCOUNTER — Other Ambulatory Visit: Payer: Self-pay | Admitting: Family Medicine

## 2022-03-11 MED ORDER — PANCRELIPASE (LIP-PROT-AMYL) 12000-38000 UNITS PO CPEP
12000.0000 [IU] | ORAL_CAPSULE | ORAL | 1 refills | Status: DC
Start: 2022-03-11 — End: 2022-06-09

## 2022-03-13 ENCOUNTER — Inpatient Hospital Stay: Payer: Medicare Other

## 2022-03-13 ENCOUNTER — Telehealth: Payer: Self-pay | Admitting: *Deleted

## 2022-03-13 ENCOUNTER — Encounter: Payer: Self-pay | Admitting: Oncology

## 2022-03-13 NOTE — Telephone Encounter (Signed)
Message from answering service that patient wants to cancel and reschedule her appointment today due to an asthma flare up . Patient also sent mychart msg

## 2022-03-20 ENCOUNTER — Other Ambulatory Visit: Payer: Self-pay | Admitting: Family Medicine

## 2022-03-20 ENCOUNTER — Telehealth: Payer: Self-pay | Admitting: Family Medicine

## 2022-03-20 ENCOUNTER — Telehealth: Payer: Self-pay

## 2022-03-20 DIAGNOSIS — Q796 Ehlers-Danlos syndrome, unspecified: Secondary | ICD-10-CM

## 2022-03-20 NOTE — Telephone Encounter (Signed)
Copied from [place] [phone]. Topic: General - Other >> Mar 20, 2022 12:06 PM Autumn Zhang wrote: Reason for GEZ:MOQHUTM called in for DME order of ring splits for her hands, because her hands hyepr extend. She has seen Dr Brita Romp about this. She is requesting it be sent to [facility], Dr Alexis Goodell. Fx # is [phone]

## 2022-03-21 ENCOUNTER — Inpatient Hospital Stay: Payer: Medicare Other | Attending: Oncology

## 2022-03-21 VITALS — BP 119/78 | HR 74 | Temp 97.1°F | Resp 18

## 2022-03-21 DIAGNOSIS — Z862 Personal history of diseases of the blood and blood-forming organs and certain disorders involving the immune mechanism: Secondary | ICD-10-CM

## 2022-03-21 DIAGNOSIS — D509 Iron deficiency anemia, unspecified: Secondary | ICD-10-CM | POA: Diagnosis present

## 2022-03-21 MED ORDER — DIPHENHYDRAMINE HCL 50 MG/ML IJ SOLN
50.0000 mg | Freq: Once | INTRAMUSCULAR | Status: AC
  Administered 2022-03-21: 50 mg via INTRAVENOUS
  Filled 2022-03-21: qty 1

## 2022-03-21 MED ORDER — SODIUM CHLORIDE 0.9 % IV SOLN
125.0000 mg | INTRAVENOUS | Status: DC
  Administered 2022-03-21: 125 mg via INTRAVENOUS
  Filled 2022-03-21: qty 10

## 2022-03-21 MED ORDER — SODIUM CHLORIDE 0.9 % IV SOLN
Freq: Once | INTRAVENOUS | Status: AC
  Filled 2022-03-21: qty 250

## 2022-03-21 MED ORDER — ACETAMINOPHEN 325 MG PO TABS
650.0000 mg | ORAL_TABLET | Freq: Once | ORAL | Status: AC
  Administered 2022-03-21: 650 mg via ORAL
  Filled 2022-03-21: qty 2

## 2022-03-21 NOTE — Patient Instructions (Signed)
Autumn Zhang [facility] AT [place]  Discharge Instructions: Thank you for choosing [facility] to provide your oncology and hematology care.  If you have a lab appointment with the [facility], please go directly to the [facility] and check in at the registration area.  Wear comfortable clothing and clothing appropriate for easy access to any Portacath or PICC line.   We strive to give you quality time with your provider. You may need to reschedule your appointment if you arrive late (15 or more minutes).  Arriving late affects you and other patients whose appointments are after yours.  Also, if you miss three or more appointments without notifying the office, you may be dismissed from the clinic at the provider's discretion.      For prescription refill requests, have your pharmacy contact our office and allow 72 hours for refills to be completed.    Today you received the following chemotherapy and/or immunotherapy agents FERRLECIT      To help prevent nausea and vomiting after your treatment, we encourage you to take your nausea medication as directed.  BELOW ARE SYMPTOMS THAT SHOULD BE REPORTED IMMEDIATELY: *FEVER GREATER THAN 100.4 F (38 C) OR HIGHER *CHILLS OR SWEATING *NAUSEA AND VOMITING THAT IS NOT CONTROLLED WITH YOUR NAUSEA MEDICATION *UNUSUAL SHORTNESS OF BREATH *UNUSUAL BRUISING OR BLEEDING *URINARY PROBLEMS (pain or burning when urinating, or frequent urination) *BOWEL PROBLEMS (unusual diarrhea, constipation, pain near the anus) TENDERNESS IN MOUTH AND THROAT WITH OR WITHOUT PRESENCE OF ULCERS (sore throat, sores in mouth, or a toothache) UNUSUAL RASH, SWELLING OR PAIN  UNUSUAL VAGINAL DISCHARGE OR ITCHING   Items with * indicate a potential emergency and should be followed up as soon as possible or go to the Emergency Department if any problems should occur.  Please show the CHEMOTHERAPY ALERT CARD or IMMUNOTHERAPY ALERT CARD at check-in to  the Emergency Department and triage nurse.  Should you have questions after your visit or need to cancel or reschedule your appointment, please contact [facility] CANCER [place] AT [place]  [phone] and follow the prompts.  Office hours are 8:00 a.m. to 4:30 p.m. Monday - Friday. Please note that voicemails left after 4:00 p.m. may not be returned until the following business day.  We are closed weekends and major holidays. You have access to a nurse at all times for urgent questions. Please call the main number to the clinic [phone] and follow the prompts.  For any non-urgent questions, you may also contact your provider using MyChart. We now offer e-Visits for anyone 35 and older to request care online for non-urgent symptoms. For details visit [email or web address].   Also download the MyChart app! Go to the app store, search "MyChart", open the app, select [facility], and log in with your MyChart username and password.  Masks are optional in the cancer centers. If you would like for your care team to wear a mask while they are taking care of you, please let them know. For doctor visits, patients may have with them one support person who is at least 53 years old. At this time, visitors are not allowed in the infusion area.  Sodium Ferric Gluconate Complex Injection What is this medication? SODIUM FERRIC GLUCONATE COMPLEX (SOE dee um FER ik GLOO koe nate KOM pleks) treats low levels of iron (iron deficiency anemia) in people with kidney disease. Iron is a mineral that plays an important role in making red blood cells, which carry oxygen from your lungs  to the rest of your body. This medicine may be used for other purposes; ask your health care provider or pharmacist if you have questions. COMMON BRAND NAME(S): Ferrlecit, Nulecit What should I tell my care team before I take this medication? They need to know if you have any of the following conditions: Anemia that is not from  iron deficiency High levels of iron in the blood An unusual or allergic reaction to iron, other medications, foods, dyes, or preservatives Pregnant or are trying to become pregnant Breast-feeding How should I use this medication? This medication is injected into a vein. It is given by your care team in a hospital or clinic setting. Talk to your care team about the use of this medication in children. While it may be prescribed for children as young as 6 years for selected conditions, precautions do apply. Overdosage: If you think you have taken too much of this medicine contact a poison control center or emergency room at once. NOTE: This medicine is only for you. Do not share this medicine with others. What if I miss a dose? It is important not to miss your dose. Call your care team if you are unable to keep an appointment. What may interact with this medication? Do not take this medication with any of the following: Deferasirox Deferoxamine Dimercaprol This medication may also interact with the following: Other iron products This list may not describe all possible interactions. Give your health care provider a list of all the medicines, herbs, non-prescription drugs, or dietary supplements you use. Also tell them if you smoke, drink alcohol, or use illegal drugs. Some items may interact with your medicine. What should I watch for while using this medication? Your condition will be monitored carefully while you are receiving this medication. Visit your care team for regular checks on your progress. You may need blood work while you are taking this medication. What side effects may I notice from receiving this medication? Side effects that you should report to your care team as soon as possible: Allergic reactions--skin rash, itching, hives, swelling of the face, lips, tongue, or throat Low blood pressure--dizziness, feeling faint or lightheaded, blurry vision Shortness of breath Side  effects that usually do not require medical attention (report to your care team if they continue or are bothersome): Flushing Headache Joint pain Muscle pain Nausea Pain, redness, or irritation at injection site This list may not describe all possible side effects. Call your doctor for medical advice about side effects. You may report side effects to FDA at 1-800-FDA-1088. Where should I keep my medication? This medication is given in a hospital or clinic and will not be stored at home. NOTE: This sheet is a summary. It may not cover all possible information. If you have questions about this medicine, talk to your doctor, pharmacist, or health care provider.  2023 Elsevier/Gold Standard (2021-01-25 00:00:00)

## 2022-03-22 ENCOUNTER — Other Ambulatory Visit: Payer: Self-pay | Admitting: Psychiatry

## 2022-04-09 ENCOUNTER — Encounter: Payer: Self-pay | Admitting: Psychiatry

## 2022-04-09 ENCOUNTER — Other Ambulatory Visit: Payer: Self-pay | Admitting: Psychiatry

## 2022-04-09 MED ORDER — QULIPTA 10 MG PO TABS
10.0000 mg | ORAL_TABLET | Freq: Every day | ORAL | 6 refills | Status: DC
Start: 2022-04-09 — End: 2022-12-22

## 2022-04-14 LAB — HM MAMMOGRAPHY

## 2022-04-21 ENCOUNTER — Ambulatory Visit: Payer: No Typology Code available for payment source | Admitting: Dermatology

## 2022-04-21 ENCOUNTER — Encounter: Payer: Self-pay | Admitting: *Deleted

## 2022-04-28 ENCOUNTER — Inpatient Hospital Stay: Payer: Medicare Other | Attending: Oncology

## 2022-04-28 DIAGNOSIS — D509 Iron deficiency anemia, unspecified: Secondary | ICD-10-CM | POA: Insufficient documentation

## 2022-04-28 LAB — CBC
HCT: 37.5 % (ref 36.0–46.0)
Hemoglobin: 12.2 g/dL (ref 12.0–15.0)
MCH: 30.1 pg (ref 26.0–34.0)
MCHC: 32.5 g/dL (ref 30.0–36.0)
MCV: 92.6 fL (ref 80.0–100.0)
Platelets: 293 10*3/uL (ref 150–400)
RBC: 4.05 MIL/uL (ref 3.87–5.11)
RDW: 13 % (ref 11.5–15.5)
WBC: 7.7 10*3/uL (ref 4.0–10.5)
nRBC: 0 % (ref 0.0–0.2)

## 2022-04-28 LAB — FERRITIN: Ferritin: 99 ng/mL (ref 11–307)

## 2022-04-28 LAB — IRON AND TIBC
Iron: 65 ug/dL (ref 28–170)
Saturation Ratios: 19 % (ref 10.4–31.8)
TIBC: 343 ug/dL (ref 250–450)
UIBC: 278 ug/dL

## 2022-05-04 ENCOUNTER — Encounter: Payer: Self-pay | Admitting: Psychiatry

## 2022-05-06 ENCOUNTER — Telehealth (INDEPENDENT_AMBULATORY_CARE_PROVIDER_SITE_OTHER): Payer: Medicare Other | Admitting: Psychiatry

## 2022-05-06 DIAGNOSIS — S0300XD Dislocation of jaw, unspecified side, subsequent encounter: Secondary | ICD-10-CM

## 2022-05-06 DIAGNOSIS — G43119 Migraine with aura, intractable, without status migrainosus: Secondary | ICD-10-CM

## 2022-05-06 NOTE — Progress Notes (Signed)
   CC:  headaches  Virtual Visit via Video Note  I connected with Autumn Zhang on 05/06/22 at  8:45 AM EDT by a video enabled telemedicine application and verified that I am speaking with the correct person using two identifiers.  Location: Patient: home Provider: office   I discussed the limitations of evaluation and management by telemedicine and the availability of in person appointments. The patient expressed understanding and agreed to proceed.   Last visit: 02/24/22  Brief HPI: 51 year old female with a history of POTS, IBS, Mast cell activation syndrome, TMJ, Ehler's danlos syndrome who follows in clinic for migraines and facial pain.  At her last visit, Autumn Zhang was increased to 20 mg daily. Autumn Zhang was started for rescue.  Interval History: She was unable to tolerate higher doses of Qulipta so it was decreased back down to 10 mg. She has baseline daily headaches but has not had any severe headaches. Has not had to use Ubrelvy.  She continues to have significant jaw pain. Has seen multiple pain specialists and surgeons without any improvement. She would like to find someone who makes custom jaw support devices and has been researching options.  Headache days per month: 30 (0 migraines) Headache free days per month: 0  Current Headache Regimen: Preventative: Qulipta 10 mg daily Abortive: Ubrelvy 50 mg PRN  Prior Therapies                                  Botox Emgality Qulipta Amitriptyline Lyrica Gabapentin Vimpat Maxalt Nurtec Ubrelvy Ketamine nasal spray Liquid lidocaine SPG block Occipital nerve block  Physical Exam:  GENERAL:  well appearing, in no acute distress, alert  HEAD:  Normocephalic/atraumatic.   NEUROLOGICAL: Mental Status: Alert, oriented to person, place and time, Follows commands, and Speech fluent and appropriate. Cranial Nerves: face symmetric, no dysarthria, hearing grossly intact Motor: moves all extremities  equally   IMPRESSION: 51 year old female with a history of POTS, IBS, Mast cell activation syndrome, TMJ, Ehler's danlos syndrome who presents for follow up of migraines and facial pain. Her migraines have improved with Qulipta and she is happy with her current headache control. She continues to struggle with jaw pain from her TMJ and jaw subluxation. Unfortunately she has already been to many specialists including surgeons at [facility] without any success. She is not interested in starting any new medications at this time.  PLAN: -Preventive: Continue Qulipta 10 mg daily -Rescue: Continue Ubrelvy 50 mg PRN -Next steps: continue propranolol or Topamax for migraine prevention  Follow-up: February 2024  I spent a total of 19 minutes on the date of the service. Headache education was done. Discussed medication side effects, adverse reactions and drug interactions. Written educational materials and patient instructions outlining all of the above were given.  Genia Harold, MD 05/06/22 9:08 AM

## 2022-05-07 ENCOUNTER — Encounter: Payer: Self-pay | Admitting: Psychiatry

## 2022-05-07 DIAGNOSIS — S0300XS Dislocation of jaw, unspecified side, sequela: Secondary | ICD-10-CM

## 2022-05-08 ENCOUNTER — Telehealth: Payer: Self-pay | Admitting: Psychiatry

## 2022-05-08 NOTE — Telephone Encounter (Signed)
Referral sent to [facility] at [place], [place] [address] Office: [phone]  Fax: [phone]

## 2022-05-09 ENCOUNTER — Ambulatory Visit
Admission: RE | Admit: 2022-05-09 | Discharge: 2022-05-09 | Disposition: A | Discharge status: Home / Self Care | Payer: Medicare Other | Source: Ambulatory Visit | Attending: Sports Medicine | Admitting: Sports Medicine

## 2022-05-09 ENCOUNTER — Other Ambulatory Visit: Payer: Self-pay | Admitting: Sports Medicine

## 2022-05-09 DIAGNOSIS — M4125 Other idiopathic scoliosis, thoracolumbar region: Secondary | ICD-10-CM | POA: Diagnosis present

## 2022-05-09 DIAGNOSIS — M545 Low back pain, unspecified: Secondary | ICD-10-CM

## 2022-05-09 DIAGNOSIS — G8929 Other chronic pain: Secondary | ICD-10-CM | POA: Insufficient documentation

## 2022-05-27 ENCOUNTER — Telehealth: Payer: Medicare Other | Admitting: Psychiatry

## 2022-06-02 ENCOUNTER — Telehealth: Payer: Medicare Other | Admitting: Psychiatry

## 2022-06-06 ENCOUNTER — Encounter: Payer: Self-pay | Admitting: Family Medicine

## 2022-06-07 ENCOUNTER — Other Ambulatory Visit: Payer: Self-pay | Admitting: Family Medicine

## 2022-06-09 NOTE — Telephone Encounter (Signed)
Requested medication (s) are due for refill today:no  Requested medication (s) are on the active medication list: yes    Last refill: 03/11/22 #270  1 refill  Future visit scheduled yes 06/19/22  Notes to clinic:Off protocol. Cannot refuse non delegated meds.Please review, thank you.  Requested Prescriptions  Pending Prescriptions Disp Refills   CREON 12000-38000 units CPEP capsule [Pharmacy Med Name: CREON DR 12,000 UNIT CAPSULE] 270 capsule 1    Sig: TAKE 1 CAPSULE (12,000 UNITS TOTAL) BY MOUTH AS DIRECTED.     Off-Protocol Failed - 06/07/2022  8:49 AM      Failed - Medication not assigned to a protocol, review manually.      Passed - Valid encounter within last 12 months    Recent Outpatient Visits           4 months ago Jaw pain   [facility] [place], Dionne Bucy, MD   5 months ago Ear pain, left   [facility] [place], [place], PA-C   5 months ago Ear pain, left   [facility] [place], [place], PA-C   6 months ago Ear pain, left   [facility] [place], [place], PA-C   8 months ago Jaw pain   [facility] [place], Dionne Bucy, MD       Future Appointments             In 4 weeks Brendolyn Patty, MD [facility]   In 2 months Sindy Gu[place], MD [facility] [place]   In 4 months Genia Harold, MD [facility]

## 2022-06-13 ENCOUNTER — Ambulatory Visit: Payer: Medicare Other | Admitting: Family Medicine

## 2022-06-18 NOTE — Progress Notes (Addendum)
I,[name],acting as a Education administrator for [name], MD.,have documented all relevant documentation on the behalf of [name], MD,as directed by  [name], MD while in the presence of [name], MD.    Annual Wellness Visit     Patient: Autumn Zhang, Female    DOB: [birth year], 51 y.o.   MRN: [id number] Visit Date: 06/19/2022  Today's Provider: [name], MD   Chief Complaint  Patient presents with   Medicare Wellness   Subjective    Autumn Zhang is a 51 y.o. female who presents today for her Annual Wellness Visit. She reports consuming a low fat diet. Home exercise routine includes walking 1 hrs per days. She generally feels fairly well. She reports sleeping poorly. She does have additional problems to discuss today.   HPI  Medications: Outpatient Medications Prior to Visit  Medication Sig   acetaminophen (TYLENOL) 500 MG tablet Take 500 mg by mouth every 6 (six) hours as needed.   albuterol (PROVENTIL HFA;VENTOLIN HFA) 108 (90 Base) MCG/ACT inhaler Inhale 2 puffs into the lungs every 4 (four) hours as needed.    aspirin 325 MG tablet Take 325 mg by mouth.   Atogepant (QULIPTA) 10 MG TABS Take 10 mg by mouth daily.   augmented betamethasone dipropionate (DIPROLENE-AF) 0.05 % cream APPLY TWICE DAILY AS NEEDED TO THE AFFECTED AREAS FOR 5-7 DAYS FOR ECZEMA.   b complex vitamins capsule Take 1 capsule by mouth daily. 1/4 capsule a day   BELSOMRA 5 MG TABS Take 1 tablet by mouth at bedtime as needed.    Blood Pressure Monitoring (BLOOD PRESSURE CUFF) MISC Take blood pressure as needed for symptoms.   Boswellia Serrata (BOSWELLIA PO) Take 150 mg by mouth every morning.    Calcium Citrate 200 MG TABS Take 600 mg by mouth.   CREON 12000-38000 units CPEP capsule TAKE 1 CAPSULE (12,000 UNITS TOTAL) BY MOUTH AS DIRECTED.   cromolyn (GASTROCROM) 100 MG/5ML solution Take by mouth 4 (four) times daily -  before meals and at bedtime.    Cyanocobalamin (VITAMIN B-12) 1000 MCG SUBL Place under the tongue.   cycloSPORINE (RESTASIS) 0.05 % ophthalmic emulsion Place 1 drop into both eyes 2 (two) times daily.   desloratadine (CLARINEX) 5 MG tablet Take 5 mg by mouth as needed.   diphenhydrAMINE (BENADRYL) 12.5 MG/5ML liquid Take by mouth 4 (four) times daily as needed.   diphenhydrAMINE-Allantoin 2-0.5 % CREA Apply 1 application topically as needed.    docusate sodium (COLACE) 100 MG capsule Take 100 mg by mouth every other day.    econazole nitrate 1 % cream    EPINEPHrine 0.3 mg/0.3 mL IJ SOAJ injection INJECT ONE AUTO-INJECTOR INTRAMUSCULARLY AS NEEDED   estradiol (ESTRACE) 0.1 MG/GM vaginal cream INSERT 1 GRAM VAGINALLY TWICE WEEKLY   famotidine (PEPCID) 10 MG tablet Take 10 mg by mouth daily.    fexofenadine (ALLEGRA) 180 MG tablet Take 180 mg by mouth 2 (two) times daily.   guaifenesin (HUMIBID E) 400 MG TABS tablet Take 400 mg by mouth as needed.   hydrOXYzine (VISTARIL) 25 MG capsule Take by mouth. 25-50 MG PRN   IODINE EX Apply 225 mcg topically. Every 3 days   KETAMINE HCL SL Place into right nostril as needed.   ketotifen (ZADITOR) 0.025 % ophthalmic solution Place 1 drop into both eyes daily as needed.   Ketotifen Fumarate POWD 1 mg by Does not apply route. 5-6 times a day   L-Lysine 500 MG CAPS  Take by mouth 3 (three) times daily.   L-Theanine 100 MG CAPS Take 200 mg by mouth daily.    levocetirizine (XYZAL) 2.5 MG/5ML solution as needed.   Light Mineral Oil-Mineral Oil 0.5-0.5 % EMUL Apply 1 application to eye daily.   loperamide (IMODIUM) 2 MG capsule Take by mouth as needed for diarrhea or loose stools.   Lutein-Zeaxanthin 25-5 MG CAPS Take 1 capsule by mouth every other day.   Magnesium Citrate POWD Take 133 mg by mouth daily.   Melatonin 1 MG/ML LIQD Take by mouth daily.   montelukast (SINGULAIR) 5 MG chewable tablet Chew 5 mg by mouth in the morning and at bedtime.   Non Gelatin Capsules, Empty, (CAPSULE #3  CLEAR/CLEAR VEG) CAPS Take 1 capsule by mouth daily.   NONFORMULARY OR COMPOUNDED ITEM Bupivacaine nasal 0.5% drops w/o epi   omega-3 acid ethyl esters (LOVAZA) 1 g capsule Take 2 capsules (2 g total) by mouth 2 (two) times daily.   pseudoephedrine (SUDAFED) 30 MG tablet Take 30 mg by mouth every 4 (four) hours as needed for congestion.    pyridostigmine (MESTINON) 60 MG tablet Take 15 mg by mouth. 3-4 times a day   Quercetin 500 MG CAPS Take by mouth daily.   Riboflavin (VITAMIN B-2 PO) Take 100 mg by mouth 3 (three) times daily.   Selenium 200 MCG CAPS Take 1 capsule by mouth once a week.   simethicone (MYLICON) 80 MG chewable tablet Chew 80 mg by mouth every 6 (six) hours as needed for flatulence.   Sodium Fluoride 1.1 % PSTE Place onto teeth.   TRIAMCINOLONE ACETONIDE,NASAL, NA Place into the nose as needed.   trolamine salicylate (ASPERCREME/ALOE) 10 % cream Apply topically.   Ubrogepant (UBRELVY) 50 MG TABS Take 50 mg by mouth as needed (for migriane). May repeat a dose in 2 hours if headache persists   vitamin C (ASCORBIC ACID) 500 MG tablet Take 500 mg by mouth daily.   Vitamin D, Cholecalciferol, 10 MCG (400 UNIT) TABS Take by mouth daily.    No facility-administered medications prior to visit.    Allergies  Allergen Reactions   Iodinated Contrast Media Hives, Itching, Palpitations, Photosensitivity and Shortness Of Breath    Other reaction(s): Headache Other reaction(s): Headache   Ioversol Anxiety, Hives, Itching and Palpitations   Ciprofloxacin Other (See Comments)   Eggs Or Egg-Derived Products Other (See Comments)    Exacerbates neuralgia symptoms Exacerbates neuralgia symptoms Congestion & Headaches Exacerbates neuralgia symptoms Congestion & Headaches    Ginger Other (See Comments)    Congestion & Headaches Congestion & Headaches    Levofloxacin Other (See Comments)    Congestion & Headaches Congestion & Headaches    Other Other (See Comments)    Novocaine  (IV) form Hearing loss Novocaine (IV) form Hearing loss  Congestion & Headaches Congestion & Headaches Congestion & Headaches Congestion & Headaches  Other reaction(s): Unknown   Soy Allergy     Other reaction(s): Other (See Comments), Unknown Congestion & Headaches Congestion & Headaches   Soybean-Containing Drug Products Other (See Comments)    Congestion & Headaches    Bee Pollen    Bupropion    Celebrex [Celecoxib]    Codeine    Dexilant [Dexlansoprazole]    Dulera [Mometasone Furo-Formoterol Fum]    Fentanyl    Fluticasone     Inhaled into lungs it does not do well   Gabapentin    Gluten Meal    Histidine    Lidocaine  Rash to topical cream   Lyrica [Pregabalin]    Mold Extract [Trichophyton]    Morphine And Related    Penicillins     Skin testing positive for allergy   Pollen Extract    Polysorbate [Sorbitan]    Prilosec [Omeprazole]    Rizatriptan Other (See Comments)    Dizziness, headache, myalgias, nausea   Salmeterol Xinafoate    Silenor [Doxepin Hcl]    Stevioside Other (See Comments)   Tomato    Xylitol Other (See Comments)   Brewer's Dried Yeast [Yeast] Other (See Comments)    Headache, flushing   Ethanol Dermatitis, Other (See Comments), Itching and Palpitations   Hydrocodone Nausea Only   Ibuprofen Nausea And Vomiting    At high doses At high doses    Lanolin Other (See Comments)    Exacerbates neuralgia symptoms Wool causes hives.    Milk (Cow) Diarrhea    Facial burning sensation Facial burning sensation Facial burning sensation Facial burning sensation    Milk-Related Compounds Diarrhea    Facial burning sensation    Morphine Nausea Only   Wheat Bran Diarrhea   Yeast-Related Products Other (See Comments)    Headache, flushing    Patient Care Team: Virginia Crews, MD as PCP - General (Family Medicine) Clent Jacks, RN as Registered Nurse  Review of Systems  Constitutional:  Positive for appetite change and  fatigue.  HENT:  Positive for congestion, ear pain, postnasal drip, sinus pressure, sore throat, tinnitus and trouble swallowing.   Eyes:  Positive for photophobia, pain, redness and itching.  Respiratory:  Positive for apnea, chest tightness and shortness of breath.   Cardiovascular:  Positive for chest pain and palpitations.  Gastrointestinal:  Positive for abdominal distention, abdominal pain, constipation, diarrhea and nausea.  Endocrine: Positive for cold intolerance, heat intolerance, polydipsia and polyuria.  Genitourinary:  Positive for enuresis, flank pain, frequency and urgency.  Musculoskeletal:  Positive for arthralgias, back pain, joint swelling, myalgias, neck pain and neck stiffness.  Skin: Negative.   Allergic/Immunologic: Positive for environmental allergies and food allergies.  Neurological:  Positive for dizziness, facial asymmetry, speech difficulty, weakness, light-headedness, numbness and headaches.  Hematological:  Bruises/bleeds easily.  Psychiatric/Behavioral: Negative.      Last CBC Lab Results  Component Value Date   WBC 7.7 04/28/2022   HGB 12.2 04/28/2022   HCT 37.5 04/28/2022   MCV 92.6 04/28/2022   MCH 30.1 04/28/2022   RDW 13.0 04/28/2022   PLT 293 59/74/1638   Last metabolic panel Lab Results  Component Value Date   GLUCOSE 103 (H) 07/02/2021   NA 140 07/02/2021   K 3.8 07/02/2021   CL 106 07/02/2021   CO2 28 07/02/2021   BUN 16 07/02/2021   CREATININE 0.62 07/02/2021   GFRNONAA >60 07/02/2021   CALCIUM 9.5 07/02/2021   PROT 7.6 07/02/2021   ALBUMIN 4.3 07/02/2021   LABGLOB 2.1 06/18/2021   AGRATIO 2.2 06/18/2021   BILITOT 0.7 07/02/2021   ALKPHOS 90 07/02/2021   AST 23 07/02/2021   ALT 18 07/02/2021   ANIONGAP 6 07/02/2021   Last lipids Lab Results  Component Value Date   CHOL 188 06/18/2021   HDL 72 06/18/2021   LDLCALC 105 (H) 06/18/2021   TRIG 59 06/18/2021   CHOLHDL 2.6 06/18/2021   Last hemoglobin A1c Lab Results   Component Value Date   HGBA1C 5.1 06/18/2021   Last thyroid functions Lab Results  Component Value Date   TSH 1.770 06/18/2021  Last vitamin D Lab Results  Component Value Date   25OHVITD2 1.5 08/28/2021   25OHVITD3 37 08/28/2021   VD25OH 32.8 06/12/2020   Last vitamin B12 and Folate Lab Results  Component Value Date   VITAMINB12 723 09/25/2021   FOLATE 11.2 09/26/2021        Objective    Vitals: There were no vitals taken for this visit. BP Readings from Last 3 Encounters:  03/21/22 119/78  02/26/22 (!) 98/58  02/06/22 102/70   Wt Readings from Last 3 Encounters:  12/19/21 147 lb (66.7 kg)  12/12/21 146 lb 1.6 oz (66.3 kg)  12/06/21 136 lb 11.2 oz (62 kg)       Physical Exam Vitals reviewed.  Constitutional:      General: She is not in acute distress.    Appearance: Normal appearance. She is well-developed. She is not diaphoretic.  HENT:     Head: Normocephalic and atraumatic.     Right Ear: Tympanic membrane, ear canal and external ear normal.     Left Ear: Tympanic membrane, ear canal and external ear normal.     Nose: Nose normal.     Mouth/Throat:     Mouth: Mucous membranes are moist.     Pharynx: Oropharynx is clear. No oropharyngeal exudate.  Eyes:     General: No scleral icterus.    Conjunctiva/sclera: Conjunctivae normal.     Pupils: Pupils are equal, round, and reactive to light.  Neck:     Thyroid: No thyromegaly.  Cardiovascular:     Rate and Rhythm: Normal rate and regular rhythm.     Pulses: Normal pulses.     Heart sounds: Normal heart sounds. No murmur heard. Pulmonary:     Effort: Pulmonary effort is normal. No respiratory distress.     Breath sounds: Normal breath sounds. No wheezing or rales.  Abdominal:     General: There is no distension.     Palpations: Abdomen is soft.     Tenderness: There is no abdominal tenderness.  Musculoskeletal:        General: No deformity.     Cervical back: Neck supple.     Right lower leg:  Edema present.     Left lower leg: Edema present.  Lymphadenopathy:     Cervical: No cervical adenopathy.  Skin:    General: Skin is warm and dry.     Findings: No rash.  Neurological:     Mental Status: She is alert and oriented to person, place, and time. Mental status is at baseline.     Sensory: No sensory deficit.     Motor: No weakness.     Gait: Gait normal.  Psychiatric:        Mood and Affect: Mood normal.        Behavior: Behavior normal.        Thought Content: Thought content normal.      Most recent functional status assessment:    06/19/2022    9:01 AM  In your present state of health, do you have any difficulty performing the following activities:  Hearing? 1  Vision? 1  Difficulty concentrating or making decisions? 0  Walking or climbing stairs? 0  Dressing or bathing? 0  Doing errands, shopping? 0   Most recent fall risk assessment:    06/19/2022    9:01 AM  [place] in the past year? 0  Number falls in past yr: 0  Injury with Fall? 0  Risk for  fall due to : No Fall Risks  Follow up Falls evaluation completed    Most recent depression screenings:    06/19/2022    9:01 AM 10/09/2021   11:47 AM  PHQ 2/9 Scores  PHQ - 2 Score 0 0  PHQ- 9 Score 11    Most recent cognitive screening:    06/11/2021    9:37 AM  6CIT Screen  What Year? 0 points  What month? 0 points  What time? 0 points  Count back from 20 0 points  Months in reverse 0 points  Repeat phrase 0 points  Total Score 0 points   Most recent Audit-C alcohol use screening    06/19/2022    9:02 AM  Alcohol Use Disorder Test (AUDIT)  1. How often do you have a drink containing alcohol? 0  2. How many drinks containing alcohol do you have on a typical day when you are drinking? 0  3. How often do you have six or more drinks on one occasion? 0  AUDIT-C Score 0   A score of 3 or more in women, and 4 or more in men indicates increased risk for alcohol abuse, EXCEPT if all of  the points are from question 1   No results found for any visits on 06/19/22.  Assessment & Plan     Annual wellness visit done today including the all of the following: Reviewed patient's Family Medical History Reviewed and updated list of patient's medical providers Assessment of cognitive impairment was done Assessed patient's functional ability Established a written schedule for health screening [place] Completed and Reviewed  Exercise Activities and Dietary recommendations  Goals   None     Immunization History  Administered Date(s) Administered   Influenza Inj Mdck Quad Pf 05/24/2018   Influenza, High Dose Seasonal PF 05/24/2018   Influenza,inj,Quad PF,6+ Mos 05/29/2016, 06/18/2017, 05/20/2019, 06/08/2020   Influenza,inj,quad, With Preservative 05/24/2018   Influenza-Unspecified 05/24/2018, 05/20/2019, 06/18/2021   Moderna Covid-19 Vaccine Bivalent Booster 38yr & up 06/05/2022   Moderna Sars-Covid-2 Vaccination 12/28/2020, 07/08/2021   PFIZER(Purple Top)SARS-COV-2 Vaccination 12/01/2019, 12/22/2019, 06/21/2020   Pneumococcal Conjugate-13 04/06/2015, 11/08/2017   Tdap 07/17/2005, 10/29/2015   Zoster Recombinat (Shingrix) 07/13/2020, 09/25/2020    Health Maintenance  Topic Date Due   INFLUENZA VACCINE  04/22/2023 (Originally 04/15/2022)   COVID-19 Vaccine (7 - Pfizer risk series) 07/31/2022   MAMMOGRAM  04/15/2023   PAP SMEAR-Modifier  09/08/2023   TETANUS/TDAP  10/28/2025   COLONOSCOPY (Pts [id number] coverage will need to be confirmed)  08/14/2026   Hepatitis C Screening  Completed   HIV Screening  Completed   Zoster Vaccines- Shingrix  Completed   HPV VACCINES  Aged Out     Discussed health benefits of physical activity, and encouraged her to engage in regular exercise appropriate for her age and condition.    Problem List Items Addressed This Visit       Digestive   Exocrine pancreatic insufficiency   Relevant Orders    VITAMIN D 25 Hydroxy (Vit-D Deficiency, Fractures)   Comprehensive metabolic panel   Vitamin K1, Serum   Vitamin A   Vitamin E   Homocysteine   TSH     Endocrine   Impaired fasting glucose   Relevant Orders   Insulin, Free and Total   Comprehensive metabolic panel   Hemoglobin A1c     Other   Problems influencing health status (Chronic)   Relevant Orders   VITAMIN D 25 Hydroxy (Vit-D  Deficiency, Fractures)   Vitamin K1, Serum   Vitamin A   Vitamin E   Homocysteine   TSH   High cholesterol   Relevant Orders   Lipid panel   Comprehensive metabolic panel   Abnormal weight gain   Relevant Orders   Lipid panel   Comprehensive metabolic panel   Vitamin K1, Serum   Vitamin A   Vitamin E   Hemoglobin A1c   Homocysteine   TSH   Other Visit Diagnoses     Encounter for annual wellness visit (AWV) in Medicare patient    -  Primary   Avitaminosis D       Relevant Orders   VITAMIN D 25 Hydroxy (Vit-D Deficiency, Fractures)   Hyperlipidemia, unspecified       Relevant Orders   Homocysteine        Return in about 6 months (around 12/19/2022) for chronic disease f/u.     I, [name], MD, have reviewed all documentation for this visit. The documentation on 06/19/22 for the exam, diagnosis, procedures, and orders are all accurate and complete.   Autumn Zhang, Dionne Bucy, MD, MPH [facility]

## 2022-06-19 ENCOUNTER — Ambulatory Visit (INDEPENDENT_AMBULATORY_CARE_PROVIDER_SITE_OTHER): Payer: Medicare Other | Admitting: Family Medicine

## 2022-06-19 ENCOUNTER — Encounter: Payer: Self-pay | Admitting: Family Medicine

## 2022-06-19 DIAGNOSIS — Z23 Encounter for immunization: Secondary | ICD-10-CM

## 2022-06-19 DIAGNOSIS — E559 Vitamin D deficiency, unspecified: Secondary | ICD-10-CM

## 2022-06-19 DIAGNOSIS — R7301 Impaired fasting glucose: Secondary | ICD-10-CM | POA: Diagnosis not present

## 2022-06-19 DIAGNOSIS — R635 Abnormal weight gain: Secondary | ICD-10-CM | POA: Diagnosis not present

## 2022-06-19 DIAGNOSIS — K8681 Exocrine pancreatic insufficiency: Secondary | ICD-10-CM

## 2022-06-19 DIAGNOSIS — E785 Hyperlipidemia, unspecified: Secondary | ICD-10-CM

## 2022-06-19 DIAGNOSIS — E7800 Pure hypercholesterolemia, unspecified: Secondary | ICD-10-CM

## 2022-06-19 DIAGNOSIS — Z789 Other specified health status: Secondary | ICD-10-CM

## 2022-06-19 DIAGNOSIS — Z0000 Encounter for general adult medical examination without abnormal findings: Secondary | ICD-10-CM | POA: Diagnosis not present

## 2022-06-19 IMAGING — US US ABDOMEN LIMITED
1 series · 14 of 25 positions shown · non-contrast
Comparison: July 05, 2021

CLINICAL DATA: indeterminate finding of liver on CT

EXAM:
ULTRASOUND ABDOMEN LIMITED RIGHT UPPER QUADRANT

[Series 1: us abdomen limited · 0.19mm/px · 14 of 34 slices shown]
[im 1/34]
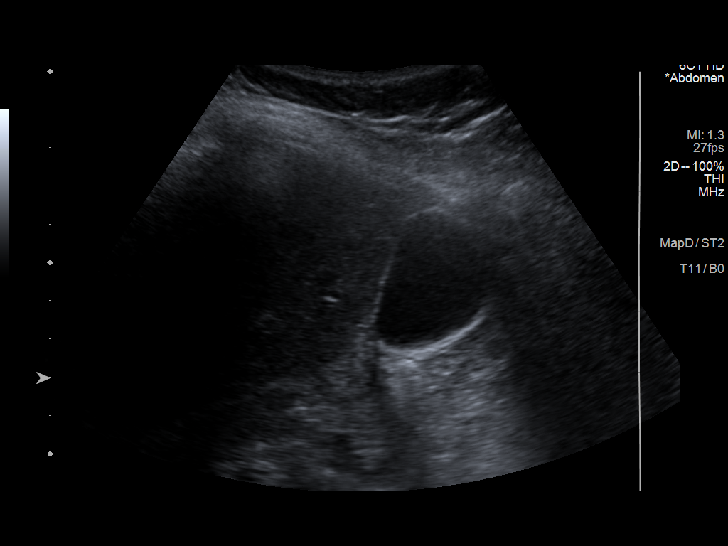
[im 3/34]
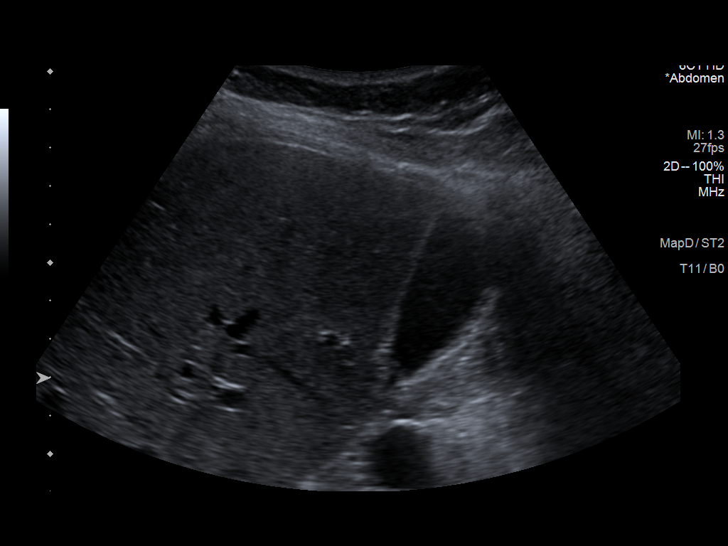
[im 6/34]
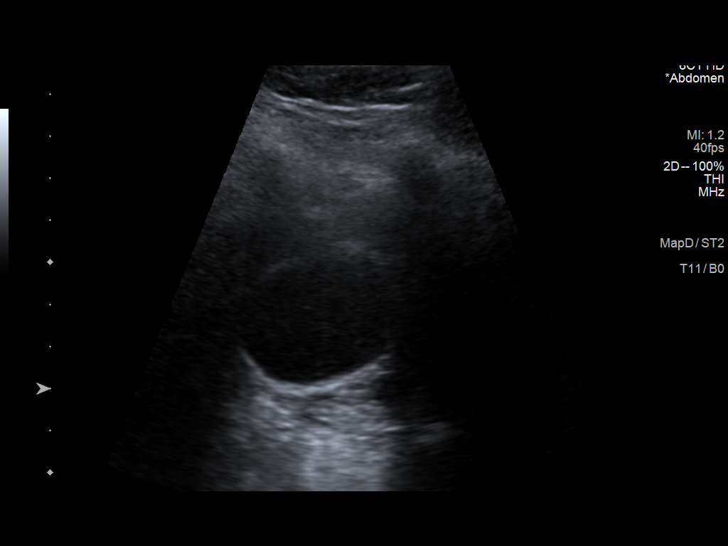
[im 9/34]
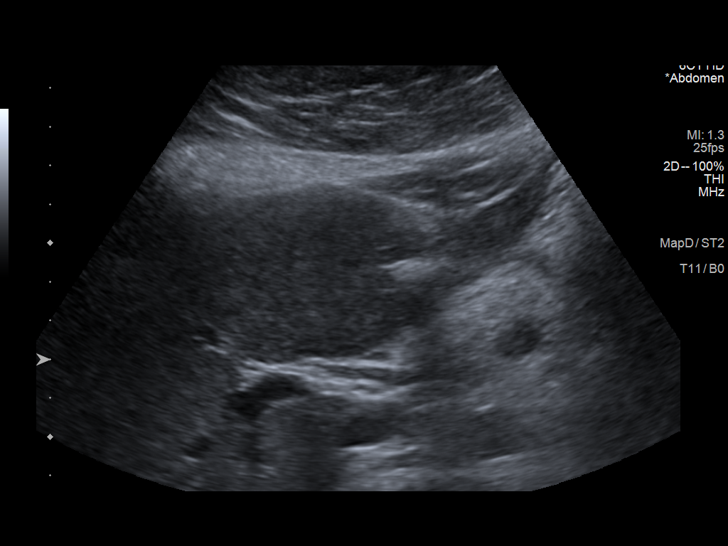
[im 12/34]
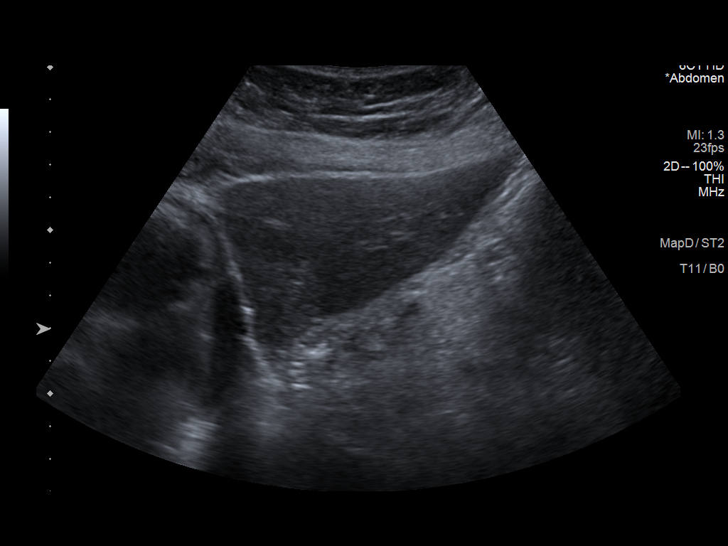
[im 13/34]
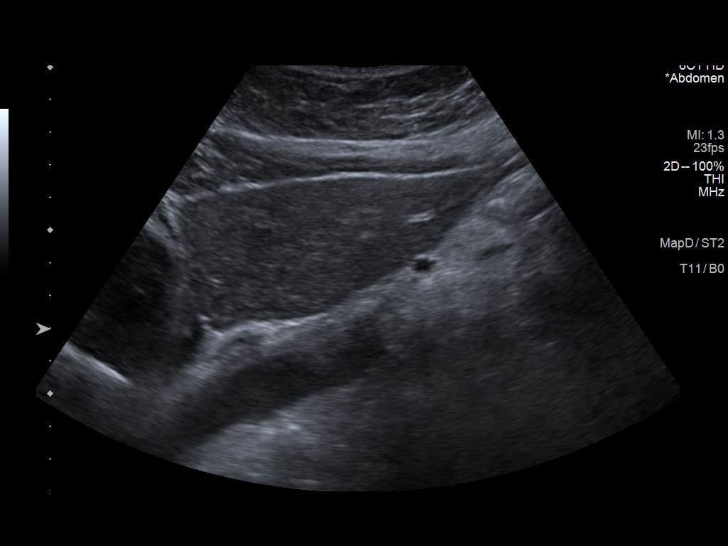
[im 16/34]
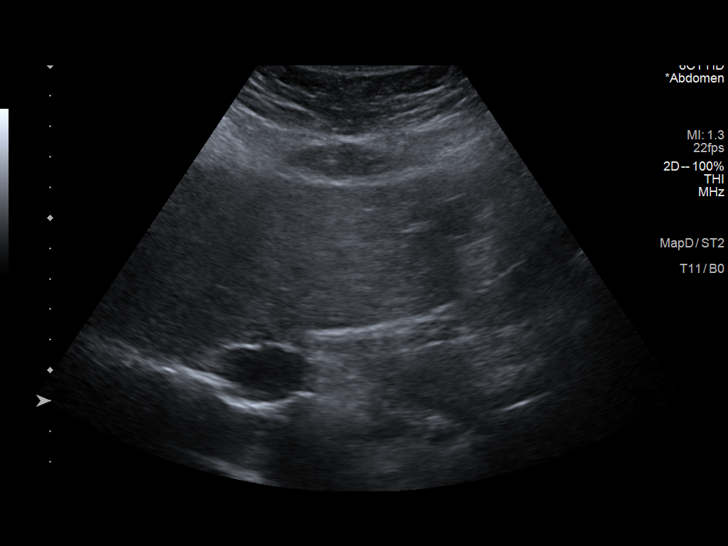
[im 18/34]
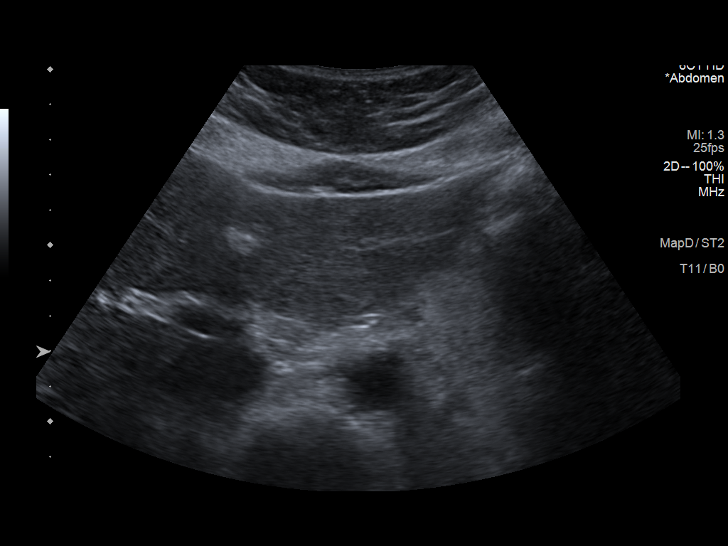
[im 21/34]
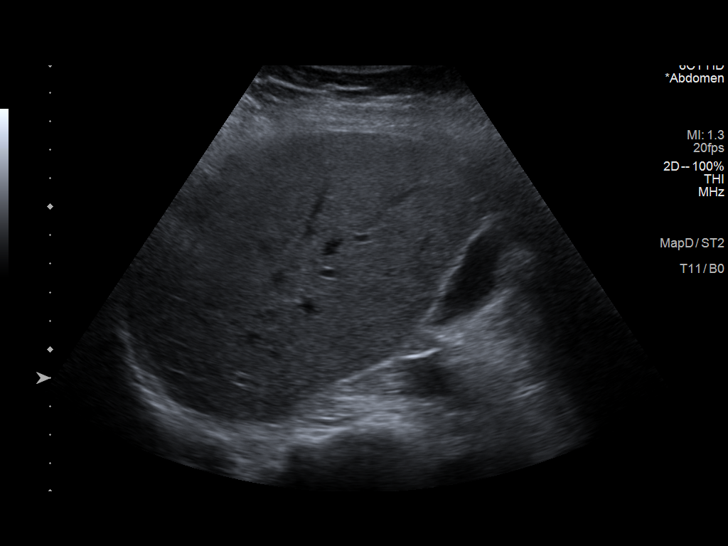
[im 23/34]
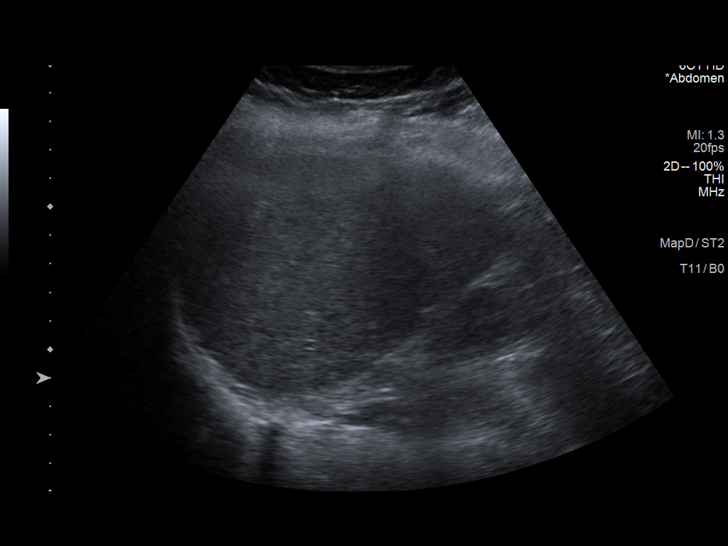
[im 25/34]
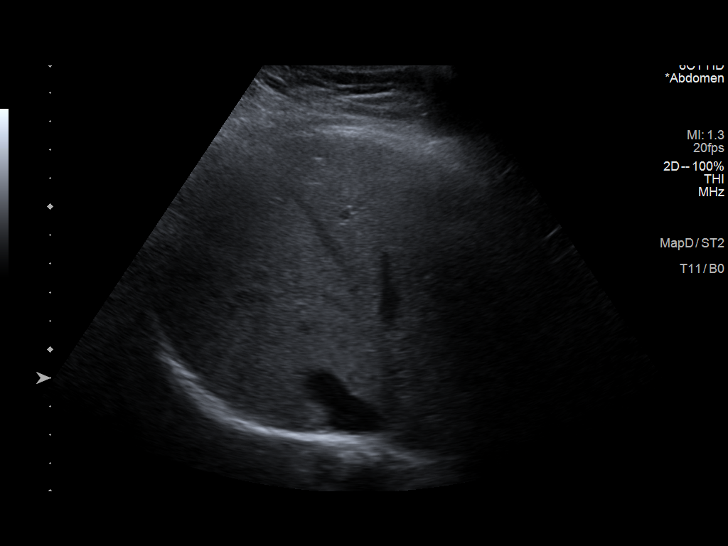
[im 28/34]
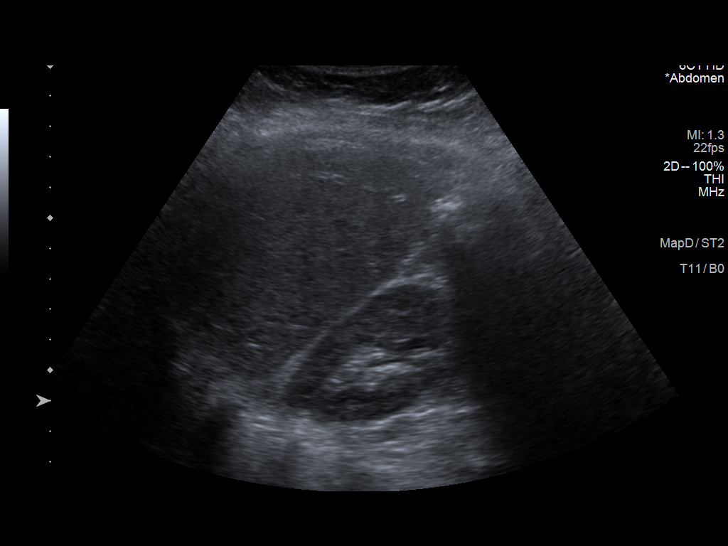
[im 31/34]
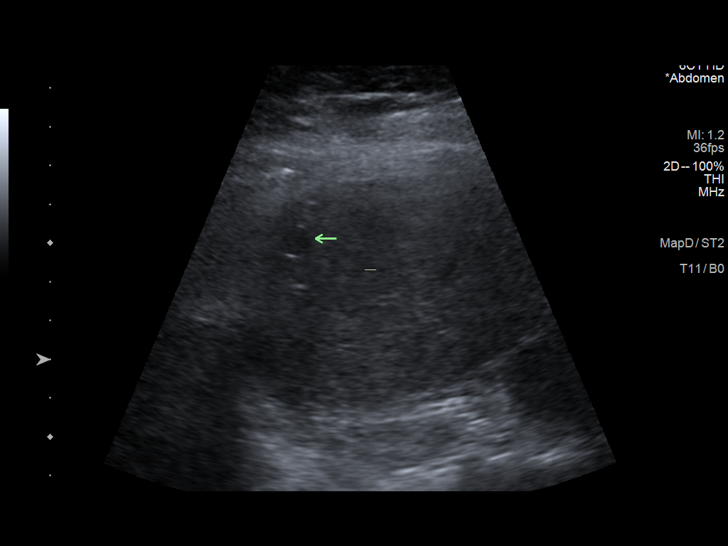
[im 34/34]
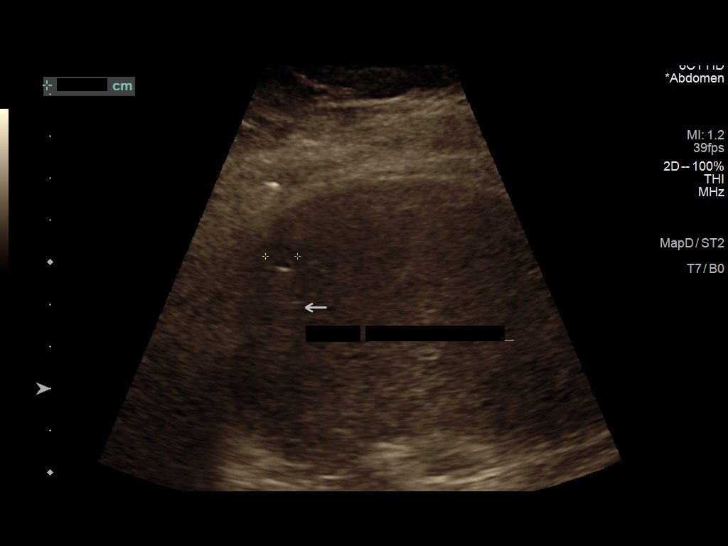

[14 of 25 positions shown; findings below may reference images not displayed]

FINDINGS: Gallbladder:

No gallstones or wall thickening visualized. No sonographic Murphy
sign noted by sonographer.

Common bile duct:

Diameter: 4 mm, normal

Liver:

In the LEFT liver, there is an oval circumscribed anechoic mass with
posterior acoustic enhancement which measures 19 x 17 x 16 mm and is
consistent with a benign cyst. In the dome of the RIGHT liver there
is an oval near anechoic mass with suggestion of posterior acoustic
enhancement. It measures 10 x 8 x 9 mm. Within normal limits in
parenchymal echogenicity. Portal vein is patent on color Doppler
imaging with normal direction of blood flow towards the liver.

Other: None.
IMPRESSION: 1. Benign LEFT hepatic cyst. Additional mass in the dome of the
RIGHT liver likely reflects an additional cyst but definitive
characterization is difficult due to positioning and small size. If
additional imaging is desired, this could be followed with dedicated
ultrasound in 1 year or definitively characterized with liver MRI.

## 2022-06-20 ENCOUNTER — Encounter (INDEPENDENT_AMBULATORY_CARE_PROVIDER_SITE_OTHER): Payer: Self-pay | Admitting: Psychiatry

## 2022-06-20 ENCOUNTER — Telehealth: Payer: Self-pay | Admitting: Psychiatry

## 2022-06-20 DIAGNOSIS — Z0289 Encounter for other administrative examinations: Secondary | ICD-10-CM

## 2022-06-20 DIAGNOSIS — S0300XD Dislocation of jaw, unspecified side, subsequent encounter: Secondary | ICD-10-CM

## 2022-06-20 NOTE — Telephone Encounter (Signed)
[facility](Matt)  regards to prescription for facial orthotic Would like to speak with Dr. Billey Gosling.

## 2022-06-23 NOTE — Progress Notes (Unsigned)
I,Autumn Zhang,acting as a Education administrator for [name], MD.,have documented all relevant documentation on the behalf of [name], MD,as directed by  [name], MD while in the presence of [name], MD.   MyChart Video Visit    Virtual Visit via Video Note   This visit type was conducted due to national recommendations for restrictions regarding the COVID-19 Pandemic (e.g. social distancing) in an effort to limit this patient's exposure and mitigate transmission in our community. This patient is at least at moderate risk for complications without adequate follow up. This format is felt to be most appropriate for this patient at this time. Physical exam was limited by quality of the video and audio technology used for the visit.   Patient location: *** Provider location: ***  I discussed the limitations of evaluation and management by telemedicine and the availability of in person appointments. The patient expressed understanding and agreed to proceed.  Patient: Autumn Zhang   DOB: [birth year]   51 y.o. Female  MRN: [id number] Visit Date: 06/24/2022  Today's healthcare provider: [name], MD   No chief complaint on file.  Subjective    HPI  ***   Medications: Outpatient Medications Prior to Visit  Medication Sig   acetaminophen (TYLENOL) 500 MG tablet Take 500 mg by mouth every 6 (six) hours as needed.   albuterol (PROVENTIL HFA;VENTOLIN HFA) 108 (90 Base) MCG/ACT inhaler Inhale 2 puffs into the lungs every 4 (four) hours as needed.    aspirin 325 MG tablet Take 325 mg by mouth.   Atogepant (QULIPTA) 10 MG TABS Take 10 mg by mouth daily.   augmented betamethasone dipropionate (DIPROLENE-AF) 0.05 % cream APPLY TWICE DAILY AS NEEDED TO THE AFFECTED AREAS FOR 5-7 DAYS FOR ECZEMA.   b complex vitamins capsule Take 1 capsule by mouth daily. 1/4 capsule a day   BELSOMRA 5 MG TABS Take 1 tablet by mouth at bedtime as needed.    Blood Pressure  Monitoring (BLOOD PRESSURE CUFF) MISC Take blood pressure as needed for symptoms.   Boswellia Serrata (BOSWELLIA PO) Take 150 mg by mouth every morning.    Calcium Citrate 200 MG TABS Take 600 mg by mouth.   CREON 12000-38000 units CPEP capsule TAKE 1 CAPSULE (12,000 UNITS TOTAL) BY MOUTH AS DIRECTED.   cromolyn (GASTROCROM) 100 MG/5ML solution Take by mouth 4 (four) times daily -  before meals and at bedtime.   Cyanocobalamin (VITAMIN B-12) 1000 MCG SUBL Place under the tongue.   cycloSPORINE (RESTASIS) 0.05 % ophthalmic emulsion Place 1 drop into both eyes 2 (two) times daily.   desloratadine (CLARINEX) 5 MG tablet Take 5 mg by mouth as needed.   diphenhydrAMINE (BENADRYL) 12.5 MG/5ML liquid Take by mouth 4 (four) times daily as needed.   diphenhydrAMINE-Allantoin 2-0.5 % CREA Apply 1 application topically as needed.    docusate sodium (COLACE) 100 MG capsule Take 100 mg by mouth every other day.    econazole nitrate 1 % cream    EPINEPHrine 0.3 mg/0.3 mL IJ SOAJ injection INJECT ONE AUTO-INJECTOR INTRAMUSCULARLY AS NEEDED   estradiol (ESTRACE) 0.1 MG/GM vaginal cream INSERT 1 GRAM VAGINALLY TWICE WEEKLY   famotidine (PEPCID) 10 MG tablet Take 10 mg by mouth daily.    fexofenadine (ALLEGRA) 180 MG tablet Take 180 mg by mouth 2 (two) times daily.   guaifenesin (HUMIBID E) 400 MG TABS tablet Take 400 mg by mouth as needed.   hydrOXYzine (VISTARIL) 25 MG capsule Take by mouth. 25-50  MG PRN   IODINE EX Apply 225 mcg topically. Every 3 days   KETAMINE HCL SL Place into right nostril as needed.   ketotifen (ZADITOR) 0.025 % ophthalmic solution Place 1 drop into both eyes daily as needed.   Ketotifen Fumarate POWD 1 mg by Does not apply route. 5-6 times a day   L-Lysine 500 MG CAPS Take by mouth 3 (three) times daily.   L-Theanine 100 MG CAPS Take 200 mg by mouth daily.    levocetirizine (XYZAL) 2.5 MG/5ML solution as needed.   Light Mineral Oil-Mineral Oil 0.5-0.5 % EMUL Apply 1 application to  eye daily.   loperamide (IMODIUM) 2 MG capsule Take by mouth as needed for diarrhea or loose stools.   Lutein-Zeaxanthin 25-5 MG CAPS Take 1 capsule by mouth every other day.   Magnesium Citrate POWD Take 133 mg by mouth daily.   Melatonin 1 MG/ML LIQD Take by mouth daily.   montelukast (SINGULAIR) 5 MG chewable tablet Chew 5 mg by mouth in the morning and at bedtime.   Non Gelatin Capsules, Empty, (CAPSULE #3 CLEAR/CLEAR VEG) CAPS Take 1 capsule by mouth daily.   NONFORMULARY OR COMPOUNDED ITEM Bupivacaine nasal 0.5% drops w/o epi   omega-3 acid ethyl esters (LOVAZA) 1 g capsule Take 2 capsules (2 g total) by mouth 2 (two) times daily.   pseudoephedrine (SUDAFED) 30 MG tablet Take 30 mg by mouth every 4 (four) hours as needed for congestion.    pyridostigmine (MESTINON) 60 MG tablet Take 15 mg by mouth. 3-4 times a day   Quercetin 500 MG CAPS Take by mouth daily.   Riboflavin (VITAMIN B-2 PO) Take 100 mg by mouth 3 (three) times daily.   Selenium 200 MCG CAPS Take 1 capsule by mouth once a week.   simethicone (MYLICON) 80 MG chewable tablet Chew 80 mg by mouth every 6 (six) hours as needed for flatulence.   Sodium Fluoride 1.1 % PSTE Place onto teeth.   TRIAMCINOLONE ACETONIDE,NASAL, NA Place into the nose as needed.   trolamine salicylate (ASPERCREME/ALOE) 10 % cream Apply topically.   Ubrogepant (UBRELVY) 50 MG TABS Take 50 mg by mouth as needed (for migriane). May repeat a dose in 2 hours if headache persists   vitamin C (ASCORBIC ACID) 500 MG tablet Take 500 mg by mouth daily.   Vitamin D, Cholecalciferol, 10 MCG (400 UNIT) TABS Take by mouth daily.    No facility-administered medications prior to visit.    Review of Systems  {Labs  Heme  Chem  Endocrine  Serology  Results Review (optional):23779}   Objective    There were no vitals taken for this visit.  {Show previous vital signs (optional):23777}   Physical Exam     Assessment & Plan     ***  No follow-ups on  file.     I discussed the assessment and treatment plan with the patient. The patient was provided an opportunity to ask questions and all were answered. The patient agreed with the plan and demonstrated an understanding of the instructions.   The patient was advised to call back or seek an in-person evaluation if the symptoms worsen or if the condition fails to improve as anticipated.  I provided *** minutes of non-face-to-face time during this encounter.  {provider attestation***:1}  [name], MD [facility] [phone] (phone) [phone] (fax)  [place]

## 2022-06-24 ENCOUNTER — Telehealth (INDEPENDENT_AMBULATORY_CARE_PROVIDER_SITE_OTHER): Payer: Medicare Other | Admitting: Family Medicine

## 2022-06-24 DIAGNOSIS — K7689 Other specified diseases of liver: Secondary | ICD-10-CM | POA: Diagnosis not present

## 2022-06-24 DIAGNOSIS — R1084 Generalized abdominal pain: Secondary | ICD-10-CM | POA: Insufficient documentation

## 2022-06-24 DIAGNOSIS — R630 Anorexia: Secondary | ICD-10-CM

## 2022-06-24 DIAGNOSIS — R635 Abnormal weight gain: Secondary | ICD-10-CM

## 2022-06-24 NOTE — Assessment & Plan Note (Signed)
Patient has been working on fasting and low-carb diet without any improvement She is not a candidate for weight loss medications She has worked with a Engineer, maintenance (IT)

## 2022-06-24 NOTE — Assessment & Plan Note (Signed)
See plan above.

## 2022-06-24 NOTE — Assessment & Plan Note (Addendum)
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

## 2022-06-24 NOTE — Assessment & Plan Note (Signed)
Noted on last CT Repeat ultrasound as it has been a year since last imaging

## 2022-06-29 ENCOUNTER — Encounter: Payer: Self-pay | Admitting: Family Medicine

## 2022-06-29 LAB — COMPREHENSIVE METABOLIC PANEL
ALT: 20 IU/L (ref 0–32)
AST: 23 IU/L (ref 0–40)
Albumin/Globulin Ratio: 2 (ref 1.2–2.2)
Albumin: 4.8 g/dL (ref 3.8–4.9)
Alkaline Phosphatase: 83 IU/L (ref 44–121)
BUN/Creatinine Ratio: 15 (ref 9–23)
BUN: 10 mg/dL (ref 6–24)
Bilirubin Total: 0.6 mg/dL (ref 0.0–1.2)
CO2: 25 mmol/L (ref 20–29)
Calcium: 9.9 mg/dL (ref 8.7–10.2)
Chloride: 100 mmol/L (ref 96–106)
Creatinine, Ser: 0.67 mg/dL (ref 0.57–1.00)
Globulin, Total: 2.4 g/dL (ref 1.5–4.5)
Glucose: 99 mg/dL (ref 70–99)
Potassium: 4 mmol/L (ref 3.5–5.2)
Sodium: 140 mmol/L (ref 134–144)
Total Protein: 7.2 g/dL (ref 6.0–8.5)
eGFR: 104 mL/min/{1.73_m2} (ref >59)

## 2022-06-29 LAB — LIPID PANEL
Chol/HDL Ratio: 2.9 ratio (ref 0.0–4.4)
Cholesterol, Total: 210 mg/dL — ABNORMAL HIGH (ref 100–199)
HDL: 72 mg/dL (ref >39)
LDL Chol Calc (NIH): 127 mg/dL — ABNORMAL HIGH (ref 0–99)
Triglycerides: 62 mg/dL (ref 0–149)
VLDL Cholesterol Cal: 11 mg/dL (ref 5–40)

## 2022-06-29 LAB — VITAMIN D 25 HYDROXY (VIT D DEFICIENCY, FRACTURES): Vit D, 25-Hydroxy: 39 ng/mL (ref 30.0–100.0)

## 2022-06-29 LAB — INSULIN, FREE AND TOTAL
Free Insulin: 3 uU/mL
Total Insulin: 3 uU/mL

## 2022-06-29 LAB — VITAMIN K1, SERUM: VITAMIN K1: 0.15 ng/mL (ref 0.10–2.20)

## 2022-06-29 LAB — SPECIMEN STATUS REPORT

## 2022-06-29 LAB — HEMOGLOBIN A1C
Est. average glucose Bld gHb Est-mCnc: 108 mg/dL
Hgb A1c MFr Bld: 5.4 % (ref 4.8–5.6)

## 2022-06-29 LAB — VITAMIN A: Vitamin A: 35.8 ug/dL (ref 20.1–62.0)

## 2022-06-29 LAB — TSH: TSH: 1.38 u[IU]/mL (ref 0.450–4.500)

## 2022-06-29 LAB — VITAMIN E
Vitamin E (Alpha Tocopherol): 10.9 mg/L (ref 7.0–25.1)
Vitamin E(Gamma Tocopherol): 0.4 mg/L — ABNORMAL LOW (ref 0.5–5.5)

## 2022-07-01 ENCOUNTER — Ambulatory Visit
Admission: RE | Admit: 2022-07-01 | Discharge: 2022-07-01 | Disposition: A | Discharge status: Home / Self Care | Payer: Medicare Other | Source: Ambulatory Visit | Attending: Family Medicine | Admitting: Family Medicine

## 2022-07-01 DIAGNOSIS — K7689 Other specified diseases of liver: Secondary | ICD-10-CM | POA: Diagnosis present

## 2022-07-02 NOTE — Telephone Encounter (Signed)
FYI attempted to call x2, but was unable to get through due to nobody answering the phone

## 2022-07-03 ENCOUNTER — Telehealth: Payer: Medicare Other | Admitting: Family Medicine

## 2022-07-03 NOTE — Telephone Encounter (Signed)
I faxed the document that was on my desk from Dr. Billey Gosling for [facility]inal [facility]osis [facility] to Adria Devon at [facility] fax # [phone].

## 2022-07-08 ENCOUNTER — Ambulatory Visit (INDEPENDENT_AMBULATORY_CARE_PROVIDER_SITE_OTHER): Payer: Medicare Other | Admitting: Dermatology

## 2022-07-08 ENCOUNTER — Encounter: Payer: Self-pay | Admitting: Dermatology

## 2022-07-08 DIAGNOSIS — D2239 Melanocytic nevi of other parts of face: Secondary | ICD-10-CM | POA: Diagnosis not present

## 2022-07-08 DIAGNOSIS — L409 Psoriasis, unspecified: Secondary | ICD-10-CM

## 2022-07-08 DIAGNOSIS — D2272 Melanocytic nevi of left lower limb, including hip: Secondary | ICD-10-CM | POA: Diagnosis not present

## 2022-07-08 DIAGNOSIS — D2271 Melanocytic nevi of right lower limb, including hip: Secondary | ICD-10-CM

## 2022-07-08 DIAGNOSIS — D229 Melanocytic nevi, unspecified: Secondary | ICD-10-CM

## 2022-07-08 DIAGNOSIS — Z85828 Personal history of other malignant neoplasm of skin: Secondary | ICD-10-CM

## 2022-07-08 DIAGNOSIS — L814 Other melanin hyperpigmentation: Secondary | ICD-10-CM

## 2022-07-08 MED ORDER — ZORYVE 0.3 % EX CREA: TOPICAL_CREAM | CUTANEOUS | 2 refills | Status: DC

## 2022-07-08 NOTE — Patient Instructions (Signed)
     Melanoma ABCDEs  Melanoma is the most dangerous type of skin cancer, and is the leading cause of death from skin disease.  You are more likely to develop melanoma if you: Have light-colored skin, light-colored eyes, or red or blond hair Spend a lot of time in the sun Tan regularly, either outdoors or in a tanning bed Have had blistering sunburns, especially during childhood Have a close family member who has had a melanoma Have atypical moles or large birthmarks  Early detection of melanoma is key since treatment is typically straightforward and cure rates are extremely high if we catch it early.   The first sign of melanoma is often a change in a mole or a new dark spot.  The ABCDE system is a way of remembering the signs of melanoma.  A for asymmetry:  The two halves do not match. B for border:  The edges of the growth are irregular. C for color:  A mixture of colors are present instead of an even brown color. D for diameter:  Melanomas are usually (but not always) greater than 6mm - the size of a pencil eraser. E for evolution:  The spot keeps changing in size, shape, and color.  Please check your skin once per month between visits. You can use a small mirror in front and a large mirror behind you to keep an eye on the back side or your body.   If you see any new or changing lesions before your next follow-up, please call to schedule a visit.  Please continue daily skin protection including broad spectrum sunscreen SPF 30+ to sun-exposed areas, reapplying every 2 hours as needed when you're outdoors.   Staying in the shade or wearing long sleeves, sun glasses (UVA+UVB protection) and wide brim hats (4-inch brim around the entire circumference of the hat) are also recommended for sun protection.    Due to recent changes in healthcare laws, you may see results of your pathology and/or laboratory studies on MyChart before the doctors have had a chance to review them. We  understand that in some cases there may be results that are confusing or concerning to you. Please understand that not all results are received at the same time and often the doctors may need to interpret multiple results in order to provide you with the best plan of care or course of treatment. Therefore, we ask that you please give us 2 business days to thoroughly review all your results before contacting the office for clarification. Should we see a critical lab result, you will be contacted sooner.   If You Need Anything After Your Visit  If you have any questions or concerns for your doctor, please call our main line at [phone] and press option 4 to reach your doctor's medical assistant. If no one answers, please leave a voicemail as directed and we will return your call as soon as possible. Messages left after 4 pm will be answered the following business day.   You may also send us a message via MyChart. We typically respond to MyChart messages within 1-2 business days.  For prescription refills, please ask your pharmacy to contact our office. Our fax number is [phone].  If you have an urgent issue when the clinic is closed that cannot wait until the next business day, you can page your doctor at the number below.    Please note that while we do our best to be available for urgent issues   outside of office hours, we are not available 24/7.   If you have an urgent issue and are unable to reach us, you may choose to seek medical care at your doctor's office, retail clinic, urgent care center, or emergency room.  If you have a medical emergency, please immediately call 911 or go to the emergency department.  Pager Numbers  - Dr. Kowalski: [phone]  - Dr. Moye: [phone]  - Dr. Stewart: [phone]  In the event of inclement weather, please call our main line at [phone] for an update on the status of any delays or closures.  Dermatology Medication Tips: Please  keep the boxes that topical medications come in in order to help keep track of the instructions about where and how to use these. Pharmacies typically print the medication instructions only on the boxes and not directly on the medication tubes.   If your medication is too expensive, please contact our office at [phone] option 4 or send us a message through MyChart.   We are unable to tell what your co-pay for medications will be in advance as this is different depending on your insurance coverage. However, we may be able to find a substitute medication at lower cost or fill out paperwork to get insurance to cover a needed medication.   If a prior authorization is required to get your medication covered by your insurance company, please allow us 1-2 business days to complete this process.  Drug prices often vary depending on where the prescription is filled and some pharmacies may offer cheaper prices.  The website www.goodrx.com contains coupons for medications through different pharmacies. The prices here do not account for what the cost may be with help from insurance (it may be cheaper with your insurance), but the website can give you the price if you did not use any insurance.  - You can print the associated coupon and take it with your prescription to the pharmacy.  - You may also stop by our office during regular business hours and pick up a GoodRx coupon card.  - If you need your prescription sent electronically to a different pharmacy, notify our office through [facility] MyChart or by phone at [phone] option 4.     Si Usted Necesita Algo Despus de Su Visita  Tambin puede enviarnos un mensaje a travs de MyChart. Por lo general respondemos a los mensajes de MyChart en el transcurso de 1 a 2 das hbiles.  Para renovar recetas, por favor pida a su farmacia que se ponga en contacto con nuestra oficina. Nuestro nmero de fax es el [phone].  Si tiene un asunto urgente  cuando la clnica est cerrada y que no puede esperar hasta el siguiente da hbil, puede llamar/localizar a su doctor(a) al nmero que aparece a continuacin.   Por favor, tenga en cuenta que aunque hacemos todo lo posible para estar disponibles para asuntos urgentes fuera del horario de oficina, no estamos disponibles las 24 horas del da, los 7 das de la semana.   Si tiene un problema urgente y no puede comunicarse con nosotros, puede optar por buscar atencin mdica  en el consultorio de su doctor(a), en una clnica privada, en un centro de atencin urgente o en una sala de emergencias.  Si tiene una emergencia mdica, por favor llame inmediatamente al 911 o vaya a la sala de emergencias.  Nmeros de bper  - Dr. Kowalski: [phone]  - Dra. Moye: [phone]  - Dra. Stewart: [phone]  En caso   de inclemencias del tiempo, por favor llame a nuestra lnea principal al [phone] para una actualizacin sobre el estado de cualquier retraso o cierre.  Consejos para la medicacin en dermatologa: Por favor, guarde las cajas en las que vienen los medicamentos de uso tpico para ayudarle a seguir las instrucciones sobre dnde y cmo usarlos. Las farmacias generalmente imprimen las instrucciones del medicamento slo en las cajas y no directamente en los tubos del medicamento.   Si su medicamento es muy caro, por favor, pngase en contacto con nuestra oficina llamando al [phone] y presione la opcin 4 o envenos un mensaje a travs de MyChart.   No podemos decirle cul ser su copago por los medicamentos por adelantado ya que esto es diferente dependiendo de la cobertura de su seguro. Sin embargo, es posible que podamos encontrar un medicamento sustituto a menor costo o llenar un formulario para que el seguro cubra el medicamento que se considera necesario.   Si se requiere una autorizacin previa para que su compaa de seguros cubra su medicamento, por favor permtanos de 1 a 2  das hbiles para completar este proceso.  Los precios de los medicamentos varan con frecuencia dependiendo del lugar de dnde se surte la receta y alguna farmacias pueden ofrecer precios ms baratos.  El sitio web www.goodrx.com tiene cupones para medicamentos de diferentes farmacias. Los precios aqu no tienen en cuenta lo que podra costar con la ayuda del seguro (puede ser ms barato con su seguro), pero el sitio web puede darle el precio si no utiliz ningn seguro.  - Puede imprimir el cupn correspondiente y llevarlo con su receta a la farmacia.  - Tambin puede pasar por nuestra oficina durante el horario de atencin regular y recoger una tarjeta de cupones de GoodRx.  - Si necesita que su receta se enve electrnicamente a una farmacia diferente, informe a nuestra oficina a travs de MyChart de [place] o por telfono llamando al [phone] y presione la opcin 4.  

## 2022-07-08 NOTE — Progress Notes (Signed)
Follow-Up Visit   Subjective  Autumn Zhang is a 51 y.o. female who presents for the following: Follow-up.  Patient presents for 6 month follow-up and mole check. She hasn't noticed any changes in the moles on her feet and legs. 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 She is using lactic acid moisturizers to treat which burns her skin. She is not able to use cortisone. Patient has multiple allergies. H/o BCC treated by Dr. Keitha Butte.  The following portions of the chart were reviewed this encounter and updated as appropriate:       Review of Systems:  No other skin or systemic complaints except as noted in HPI or Assessment and Plan.  Objective  Well appearing patient in no apparent distress; mood and affect are within normal limits.  A focused examination was performed including face, arms, legs. Relevant physical exam findings are noted in the Assessment and Plan.  Mid Tip of Nose 2 mm indistinct light pink macule at mid nasal tip   elbows Pink scaly patches  Left Lower Calf; bilateral feet 3 mm medium dark speckled macule left lower calf; multiple brown macules of the bilateral feet. Photos compared no changes       Right calf Well healed scar with no evidence of recurrence. With adjacent brown macule c/w nevus         Assessment & Plan  Fibrous papule of nose Mid Tip of Nose  with hx of irritation  Photos compared today. Improved, seems to be resolved today  Benign-appearing.  Observation.  Call clinic for new or changing lesions.  Recommend daily use of broad spectrum spf 30+ sunscreen to sun-exposed areas.  Will continue to monitor   Psoriasis elbows  Chronic and persistent condition with duration or expected duration over one year. Condition is bothersome/symptomatic for patient. Currently flared.   Psoriasis is a chronic non-curable, but treatable genetic/hereditary disease that may have other systemic features affecting other organ systems  such as joints (Psoriatic Arthritis). It is associated with an increased risk of inflammatory bowel disease, heart disease, non-alcoholic fatty liver disease, and depression.    Start Zoryve Cream Apply to elbows QD dsp 60g 2Rf. Samples given. Pt not able to use steroid cream due to high cortisol condition.   Roflumilast (ZORYVE) 0.3 % CREA - elbows Apply to elbows once daily.  Nevus Left Lower Calf; bilateral feet  Benign-appearing. Stable compared to previous visit. Observation.  Call clinic for new or changing moles.  Recommend daily use of broad spectrum spf 30+ sunscreen to sun-exposed areas.     History of basal cell carcinoma (BCC) Right calf  Clear. Observe for recurrence. Call clinic for new or changing lesions.  Recommend regular skin exams, daily broad-spectrum spf 30+ sunscreen use, and photoprotection.    Melanocytic Nevi - Tan-brown and/or pink-flesh-colored symmetric macules and papules - Benign appearing on exam today - Observation - Call clinic for new or changing moles - Recommend daily use of broad spectrum spf 30+ sunscreen to sun-exposed areas.    Lentigines - Scattered tan macules - Due to sun exposure - Benign-appearing, observe - Recommend daily broad spectrum sunscreen SPF 30+ to sun-exposed areas, reapply every 2 hours as needed. - Call for any changes   Return in about 6 months (around 01/07/2023) for Hx BCC, check moles.  Lindi Adie, CMA, am acting as scribe for Brendolyn Patty, MD .  Documentation: I have reviewed the above documentation for accuracy and completeness, and  I agree with the above.  Brendolyn Patty MD

## 2022-07-09 ENCOUNTER — Other Ambulatory Visit: Payer: Self-pay | Admitting: Family Medicine

## 2022-07-10 ENCOUNTER — Other Ambulatory Visit: Payer: Self-pay | Admitting: Family Medicine

## 2022-07-10 MED ORDER — PANCRELIPASE (LIP-PROT-AMYL) 12000-38000 UNITS PO CPEP: 12000.0000 [IU] | ORAL_CAPSULE | Freq: Three times a day (TID) | ORAL | 0 refills | Status: DC

## 2022-07-14 ENCOUNTER — Encounter: Payer: Self-pay | Admitting: Dermatology

## 2022-07-14 MED ORDER — CALCIPOTRIENE 0.005 % EX OINT: TOPICAL_OINTMENT | Freq: Two times a day (BID) | CUTANEOUS | 0 refills | Status: DC

## 2022-07-14 NOTE — Telephone Encounter (Signed)
Zoryve denied due to patient has not tried or failed Calcipotriene Ointment or Solution. Appeal would be hard to do or prove for approval in this matter.

## 2022-07-18 ENCOUNTER — Encounter: Payer: Self-pay | Admitting: Family Medicine

## 2022-07-28 ENCOUNTER — Encounter: Payer: Self-pay | Admitting: Family Medicine

## 2022-08-04 ENCOUNTER — Encounter: Payer: Self-pay | Admitting: Oncology

## 2022-08-18 ENCOUNTER — Telehealth: Payer: Self-pay

## 2022-08-18 NOTE — Telephone Encounter (Signed)
Patient called Calcipotriene cream is not helping her psoriasis, patient think its making psoriasis worse, she has a few samples of Zoryve cream she would like to know if she should start using, discussed with patient she can stop Calcipotriene cream and start samples of Zoryve, pt will call back if she need a rx called in to the pharmacy.

## 2022-08-30 ENCOUNTER — Other Ambulatory Visit: Payer: Self-pay | Admitting: Family Medicine

## 2022-09-01 ENCOUNTER — Other Ambulatory Visit: Payer: Medicare Other

## 2022-09-01 ENCOUNTER — Ambulatory Visit: Payer: Medicare Other | Admitting: Oncology

## 2022-09-02 ENCOUNTER — Encounter: Payer: Self-pay | Admitting: Dermatology

## 2022-09-02 ENCOUNTER — Ambulatory Visit: Payer: Medicare Other | Admitting: Oncology

## 2022-09-02 NOTE — Telephone Encounter (Signed)
A new prior Josem Kaufmann has been submitted on Cover My Meds.   Abby will you let me know if you see anything on this before I do? Thank you!

## 2022-09-03 ENCOUNTER — Telehealth: Payer: Self-pay

## 2022-09-03 ENCOUNTER — Encounter: Payer: Self-pay | Admitting: Oncology

## 2022-09-03 IMAGING — US US PELVIS COMPLETE WITH TRANSVAGINAL
1 series · 13 of 25 positions shown · non-contrast
Comparison: Outside exam 08/22/2021

CLINICAL DATA: Follow LEFT ovary, history of colon cancer,
screening for ovarian cancer. Prior RIGHT hemicolectomy, endometrial
ablation in 4288



[Series 1: us pelvic complete with transvaginal · 13 of 75 slices shown]
[im 1/75]
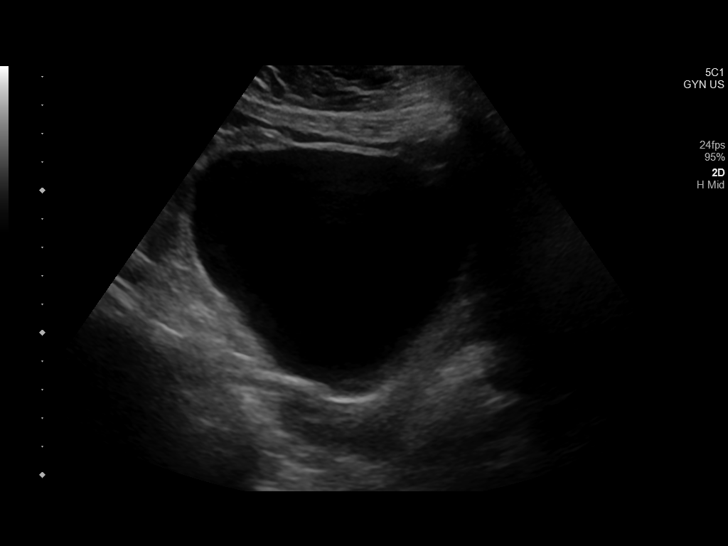
[im 7/75]
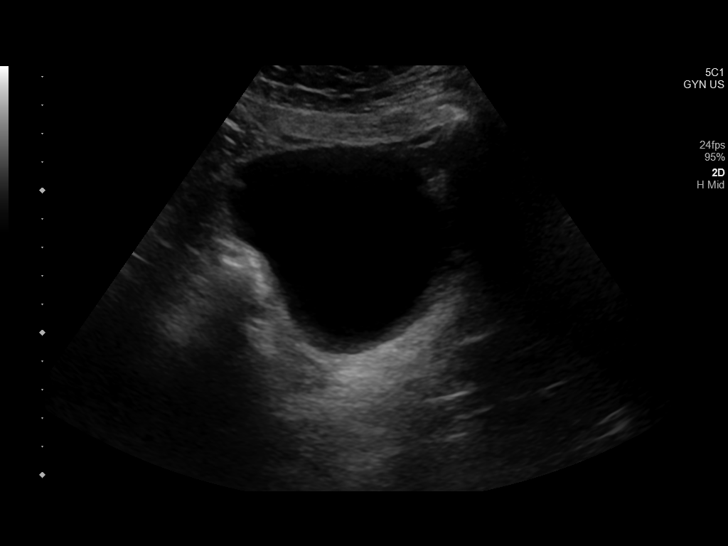
[im 13/75]
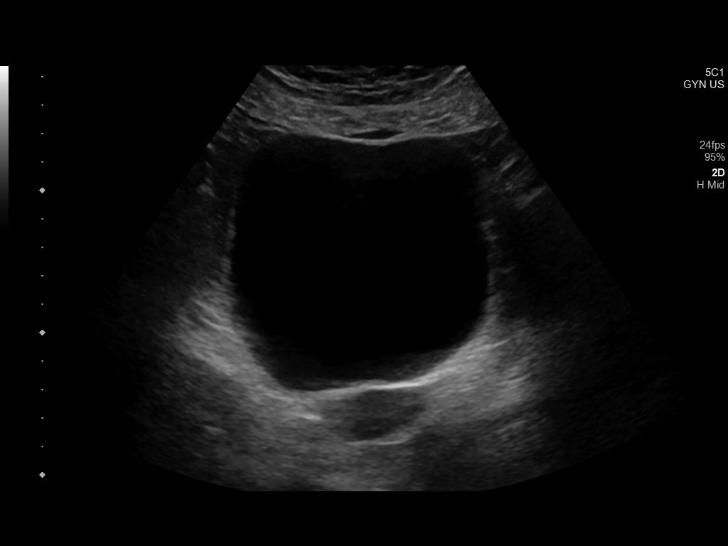
[im 19/75]
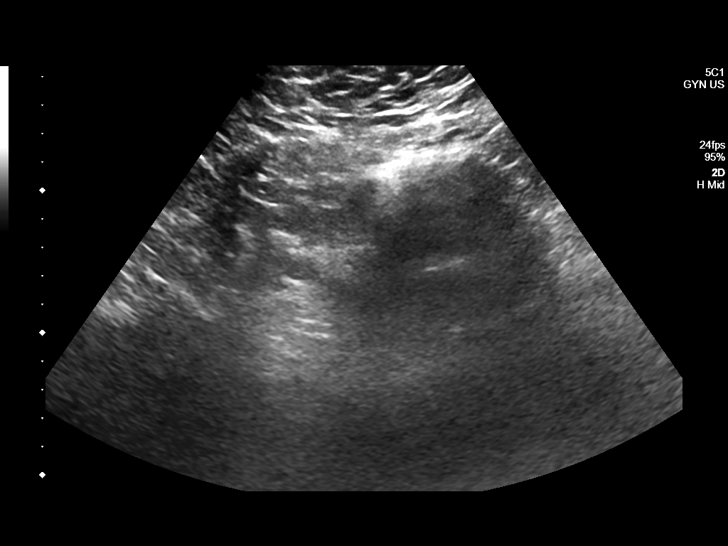
[im 25/75]
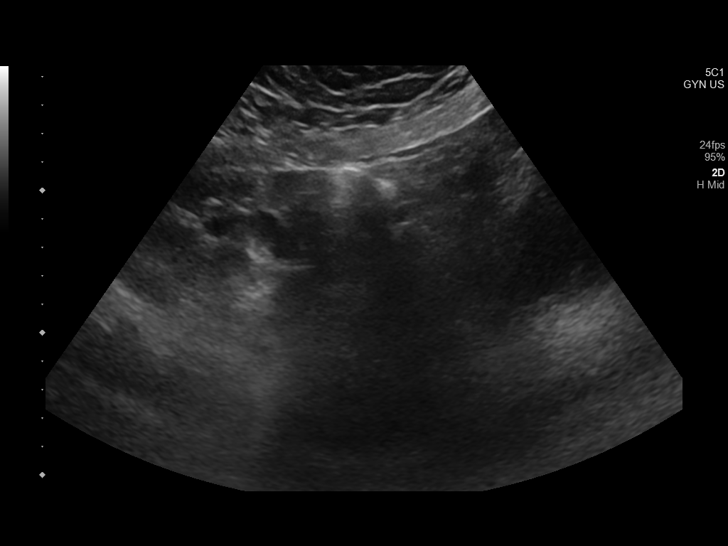
[im 31/75]
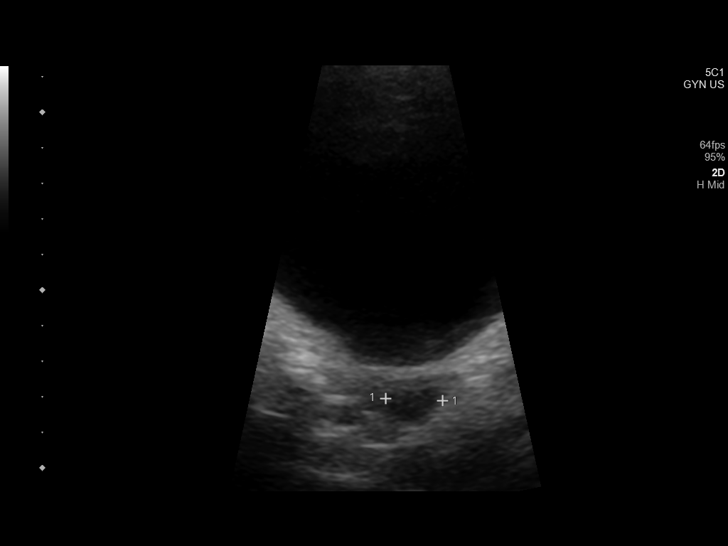
[im 38/75]
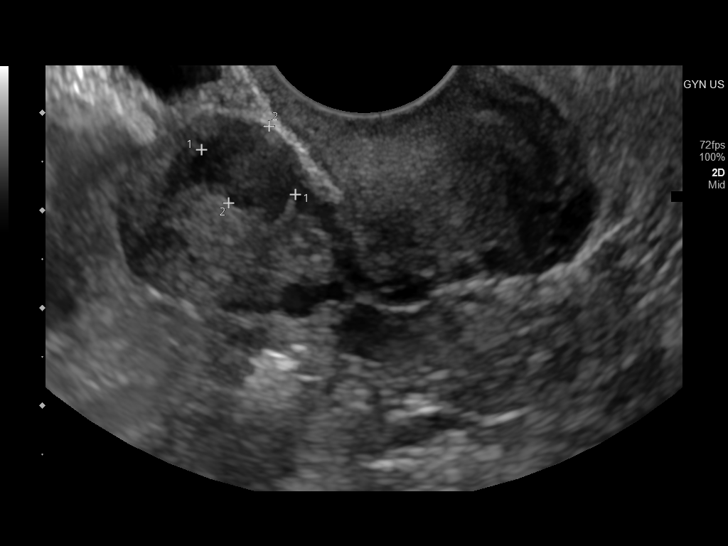
[im 44/75]
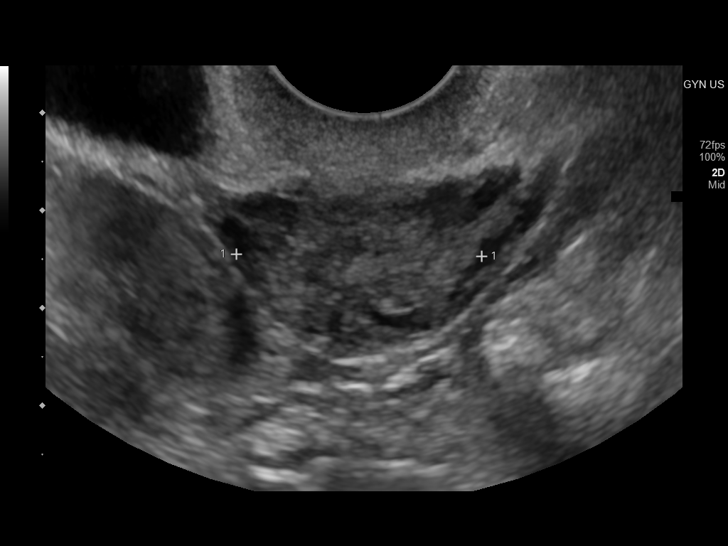
[im 50/75]
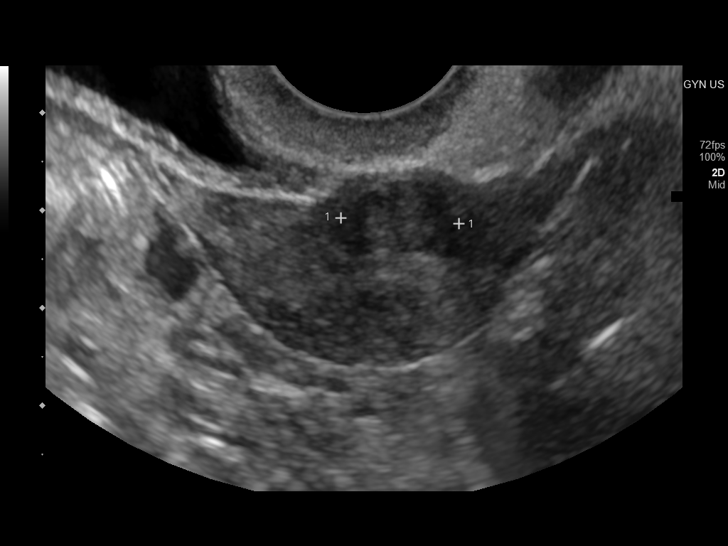
[im 56/75]
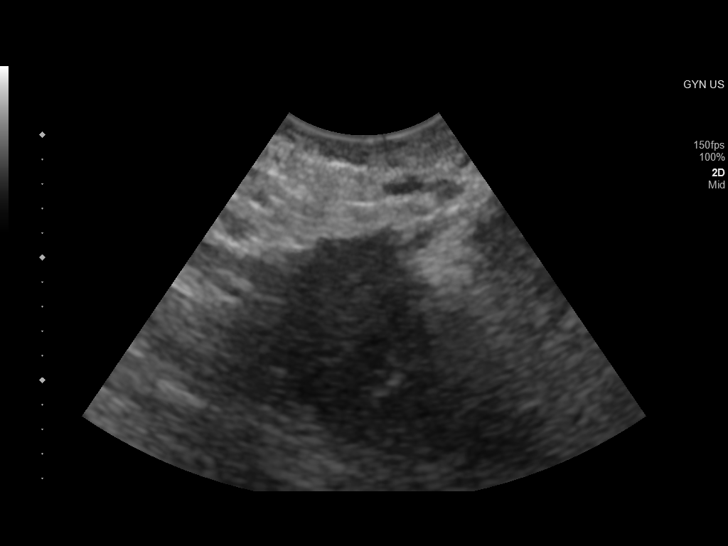
[im 62/75]
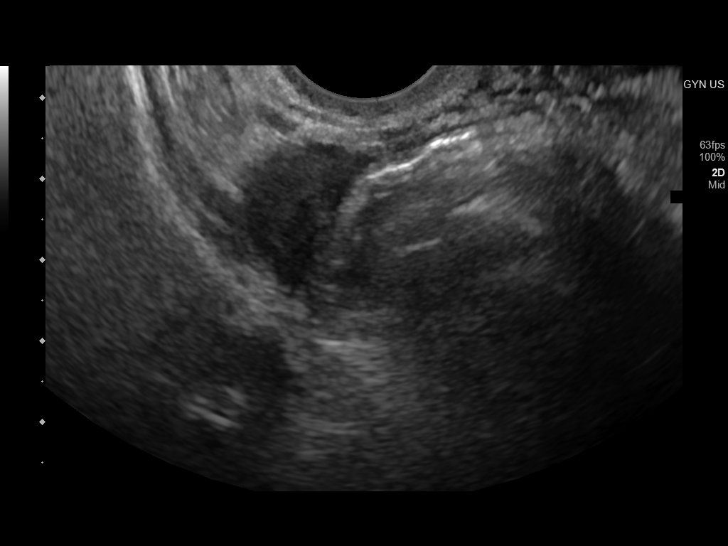
[im 68/75]
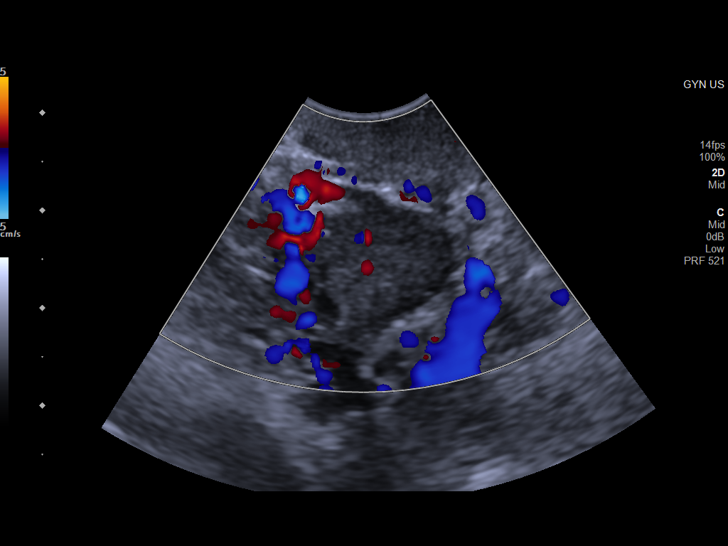
[im 75/75]
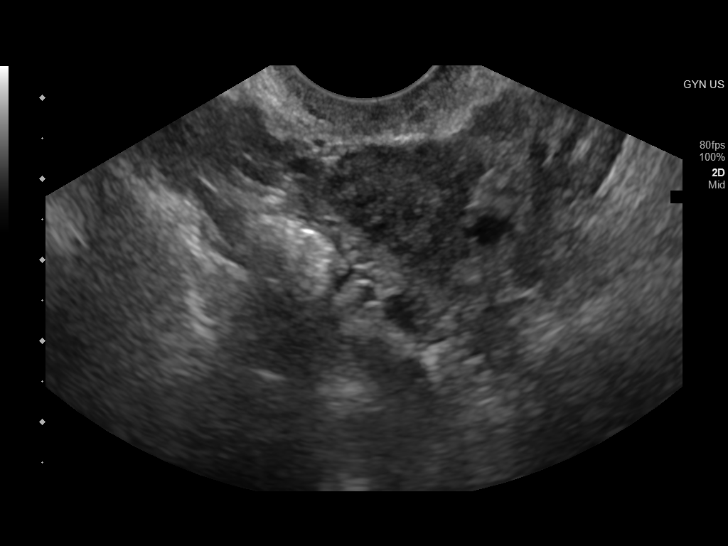

[13 of 25 positions shown; findings below may reference images not displayed]

FINDINGS: Uterus

Measurements: 4.9 x 1.7 x 2.5 cm = volume: 11 mL. Anteverted.
Atrophic. Small anterior wall subserosal leiomyoma 1 x 9 x 12 mm.
Mildly heterogeneous myometrium without additional mass.

Endometrium

Thickness: 3 mm.  No endometrial fluid or mass

Right ovary

Measurements: 1.7 x 1.0 x 1.1 cm = volume: 0.9 mL. Normal morphology
without mass

Left ovary

Measurements: 1.6 x 1.5 x 1.2 cm = volume: 1.5 mL. Normal morphology
without mass

Other findings

No free pelvic fluid.  No adnexal masses.
IMPRESSION: Small subserosal leiomyoma at anterior wall of uterus measuring 12
mm diameter.

Remainder of exam unremarkable.

## 2022-09-03 NOTE — Telephone Encounter (Signed)
Per. Dr. Janese Banks- patient does not need IV iron based on lab results scanned in on 12/19. Patient has been informed and expressed understanding. She is requesting for her appointment Friday to be a video visit. Per Judeen Hammans that is ok. Anderson Malta would you mind changing the appointment please? Thank you!

## 2022-09-04 ENCOUNTER — Encounter: Payer: Self-pay | Admitting: Oncology

## 2022-09-05 ENCOUNTER — Encounter: Payer: Self-pay | Admitting: Oncology

## 2022-09-05 ENCOUNTER — Inpatient Hospital Stay: Payer: Medicare Other | Attending: Oncology | Admitting: Oncology

## 2022-09-05 DIAGNOSIS — D509 Iron deficiency anemia, unspecified: Secondary | ICD-10-CM | POA: Diagnosis not present

## 2022-09-05 NOTE — Progress Notes (Signed)
I connected with Autumn Zhang on 09/05/22 at 11:30 AM EST by video enabled telemedicine visit and verified that I am speaking with the correct person using two identifiers.   I discussed the limitations, risks, security and privacy concerns of performing an evaluation and management service by telemedicine and the availability of in-person appointments. I also discussed with the patient that there may be a patient responsible charge related to this service. The patient expressed understanding and agreed to proceed.  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  She underwent right hemicolectomy which revealed poorly differentiated invasive adenocarcinoma T3N0.  0 out of 34 lymph nodes were positive.  She received 12 cycles of adjuvant chemotherapy with 5-FU leucovorin.  No oxaliplatin.  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 her father died of colon cancer at the age of 23.  Her only brother has also had colon polyps.  She has therefore been managed as a Lynch syndrome patient despite not having genetic features suggestive of Lynch syndrome.  She took prophylactic aspirin for a while and then she stopped taking it.  She has been getting colonoscopies every 1 to 2 years.  She has also had capsule endoscopy in 2014 for iron deficiency anemia that was normal.  She has not required any surveillance scans as she has been more than 5 years since her diagnosis of colon cancer. She has established care with Dr. Marius Ditch for this.   Other than that patient has a history of basal cell skin cancer x2 and is in the process of finding a dermatologist.  She was also getting yearly pelvic exams  and pelvic ultrasound to screen for GYN cancers.  Patient has a history of mast cell activation syndrome and often needs Benadryl before taking oral iron.  Also reports that she has dysautonomia and pots for which she sees cardiology. Also has a h/o Ehler Danlos syndrome   Currently patient has an ongoing migraine attack.  She is wearing dark glasses and a cat and unable to see eye to eye.  She denies any blood in her stool or urine presently.  Denies any dark melanotic stools.  Her last IV iron was about 6 months ago and she does not remember what type of IV iron she has received.    She sees Dr. Esperanza Sheets from [facility] for mast cell activation syndrome    Interval history patient does report some baseline fatigue.  Denies any new complaints at this time   Review of Systems  Constitutional:  Positive for malaise/fatigue. Negative for chills, fever and weight loss.  HENT:  Negative for congestion, ear discharge and nosebleeds.   Eyes:  Negative for blurred vision.  Respiratory:  Negative for cough, hemoptysis, sputum production, shortness of breath and wheezing.   Cardiovascular:  Negative for chest pain, palpitations, orthopnea and claudication.  Gastrointestinal:  Negative for abdominal pain, blood in stool, constipation, diarrhea, heartburn, melena, nausea and vomiting.  Genitourinary:  Negative for dysuria, flank pain, frequency, hematuria and urgency.  Musculoskeletal:  Negative for back pain, joint pain and myalgias.  Skin:  Negative for rash.  Neurological:  Negative for dizziness, tingling, focal weakness, seizures, weakness and headaches.  Endo/Heme/Allergies:  Does not bruise/bleed easily.  Psychiatric/Behavioral:  Negative for depression and suicidal ideas. The patient does not have insomnia.     Allergies  Allergen Reactions   Iodinated Contrast Media Hives, Itching, Palpitations, Photosensitivity and Shortness Of Breath    Other reaction(s): Headache Other reaction(s): Headache    Ioversol Anxiety, Hives, Itching and Palpitations   Ciprofloxacin Other (See Comments)   Eggs Or Egg-Derived Products Other (See Comments)    Exacerbates neuralgia symptoms Exacerbates neuralgia symptoms Congestion & Headaches Exacerbates neuralgia symptoms Congestion & Headaches    Ginger Other (See Comments)    Congestion & Headaches Congestion & Headaches    Levofloxacin Other (See Comments)    Congestion & Headaches Congestion & Headaches    Other Other (See Comments)    Novocaine (IV) form Hearing loss Novocaine (IV) form Hearing loss  Congestion & Headaches Congestion & Headaches Congestion & Headaches Congestion & Headaches  Other reaction(s): Unknown   Soy Allergy     Other reaction(s): Other (See Comments), Unknown Congestion & Headaches Congestion & Headaches   Soybean-Containing Drug Products Other (See Comments)    Congestion & Headaches    Bee Pollen    Bupropion    Celebrex [Celecoxib]    Codeine    Dexilant [Dexlansoprazole]    Doxepin     Other Reaction(s): Unknown   Dulera [Mometasone Furo-Formoterol Fum]    Fentanyl    Fluticasone     Inhaled into lungs it does not do well   Gabapentin    Gluten Meal    Histidine    Lidocaine     Rash to topical cream   Lyrica [Pregabalin]    Mold Extract [Trichophyton]    Morphine And Related    Penicillins     Skin testing positive for allergy   Pollen Extract    Polysorbate [Sorbitan]    Prilosec [Omeprazole]    Rizatriptan Other (See Comments)    Dizziness, headache, myalgias, nausea   Salmeterol Xinafoate    Silenor [Doxepin Hcl]    Stevioside Other (See Comments)   Tomato    Xylitol Other (See Comments)   Brewer's Dried Yeast [Yeast] Other (See Comments)    Headache, flushing   Ethanol Dermatitis, Other (See Comments), Itching and Palpitations   Hydrocodone Nausea Only   Ibuprofen Nausea And Vomiting    At high doses At high doses    Lanolin Other (See Comments)    Exacerbates  neuralgia symptoms Wool causes hives.    Milk (Cow) Diarrhea    Facial burning sensation Facial burning sensation Facial burning sensation Facial burning sensation    Milk-Related Compounds Diarrhea    Facial burning sensation    Morphine Nausea Only   Wheat Bran Diarrhea   Yeast-Related Products Other (See Comments)    Headache, flushing    Past Medical History:  Diagnosis Date   Allergy    Anemia    Anxiety    Arthritis    Asthma    Attention deficit hyperactivity disorder (ADHD), combined type 10/18/2015   Diagnosed many years ago.  Has been doing well on Ritalin 28m QID to six times a day. Long acting hasn't worked well in the past and she has not wanted to try Adderall. She is taking wellbutrin as well, but she has been weaning medication due to SE migraine.  She is now down to Wellbutrin one quarter tablet daily.  Last Assessment & Plan:  A/P: Stable.  Continue current dosing.  Refilled 3 months   Basal  cell carcinoma 05/16/2021   Superficial, R post lower leg. [facility] [place], [place]   Basal cell carcinoma 09/03/2006   L chest, R forearm. [place], [place]   BRCA negative 09/2018   2015 [place] at [facility]; has VUS; 1/20 MyRisk neg except RPS20 VUS; IBIS=10.8%/riskscore=12%   Colon cancer ([place])    Dysautonomia ([place])    Dysplastic nevus 05/28/2009   right lateral back, [facility] [place], [place]   Ehlers-Danlos syndrome type III    Family history of colon cancer    GERD (gastroesophageal reflux disease)    Hyperlipidemia    Idiopathic small fiber peripheral neuropathy    Leiomyoma    Mast cell activation ([place])    Migraines    Neck pain    Occipital neuralgia    Sleep apnea     Past Surgical History:  Procedure Laterality Date   APPENDECTOMY     removed with colectomy   BREAST SURGERY     reduction   COLON SURGERY  2008   for colon cancer, ~1/3 of colon removed, primary reanastimosis   COLONOSCOPY WITH PROPOFOL N/A 10/07/2018    Procedure: COLONOSCOPY WITH PROPOFOL;  Surgeon: Lin Landsman, MD;  Location: [facility] ENDOSCOPY;  Service: Gastroenterology;  Laterality: N/A;   COLONOSCOPY WITH PROPOFOL N/A 08/14/2021   Procedure: COLONOSCOPY WITH PROPOFOL;  Surgeon: Lin Landsman, MD;  Location: [facility] ENDOSCOPY;  Service: Gastroenterology;  Laterality: N/A;   ENDOMETRIAL ABLATION     trigeminal stimulator      Social History   Socioeconomic History   Marital status: Married    Spouse name: Not on file   Number of children: 0   Years of education: Not on file   Highest education level: Not on file  Occupational History   Occupation: SSI and disability  Tobacco Use   Smoking status: Former    Types: Cigarettes   Smokeless tobacco: Never   Tobacco comments:    Quit 30 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 Use: Never used  Substance and Sexual Activity   Alcohol use: Never   Drug use: Never   Sexual activity: Not Currently    Birth control/protection: None  Other Topics Concern   Not on file  Social History Narrative   Not on file   Social Determinants of Health   Financial Resource Strain: Not on file  Food Insecurity: Not on file  Transportation Needs: Not on file  Physical Activity: Not on file  Stress: Not on file  Social Connections: Not on file  Intimate Partner Violence: Not on file    Family History  Problem Relation Age of Onset   Colon cancer Father 74   Breast cancer Maternal Aunt 60       BRCA neg   Endometrial cancer Maternal Aunt 53   Colon cancer Maternal Grandmother 8   Migraines Maternal Grandmother    Tremor Maternal Grandmother    Leukemia Maternal Grandfather 35   Food Allergy Maternal Grandfather    Migraines Mother    Glaucoma Mother    Cataracts Mother    Tremor Mother    Fibromyalgia Mother    Heart disease Mother    Colon polyps Brother        non-cancerous     Current Outpatient Medications:    acetaminophen (TYLENOL) 500 MG  tablet, Take 500 mg by mouth every 6 (six) hours as needed., Disp: , Rfl:    albuterol (PROVENTIL HFA;VENTOLIN  HFA) 108 (90 Base) MCG/ACT inhaler, Inhale 2 puffs into the lungs every 4 (four) hours as needed. , Disp: , Rfl:    aspirin 325 MG tablet, Take 325 mg by mouth., Disp: , Rfl:    Atogepant (QULIPTA) 10 MG TABS, Take 10 mg by mouth daily., Disp: 30 tablet, Rfl: 6   augmented betamethasone dipropionate (DIPROLENE-AF) 0.05 % cream, APPLY TWICE DAILY AS NEEDED TO THE AFFECTED AREAS FOR 5-7 DAYS FOR ECZEMA., Disp: , Rfl:    b complex vitamins capsule, Take 1 capsule by mouth daily. 1/4 capsule a day, Disp: , Rfl:    Blood Pressure Monitoring (BLOOD PRESSURE CUFF) MISC, Take blood pressure as needed for symptoms., Disp: , Rfl:    Boswellia Serrata (BOSWELLIA PO), Take 150 mg by mouth every morning. , Disp: , Rfl:    Calcium Citrate 200 MG TABS, Take 600 mg by mouth., Disp: , Rfl:    cromolyn (GASTROCROM) 100 MG/5ML solution, Take by mouth 4 (four) times daily -  before meals and at bedtime., Disp: , Rfl:    Cyanocobalamin (VITAMIN B-12) 1000 MCG SUBL, Place under the tongue., Disp: , Rfl:    desloratadine (CLARINEX) 5 MG tablet, Take 5 mg by mouth as needed., Disp: , Rfl:    Diclofenac Sodium 1.5 % SOLN, 40 drops Externally Four times a day, Disp: , Rfl:    diphenhydrAMINE (BENADRYL) 12.5 MG/5ML liquid, Take by mouth 4 (four) times daily as needed., Disp: , Rfl:    diphenhydrAMINE-Allantoin 2-0.5 % CREA, Apply 1 application topically as needed. , Disp: , Rfl:    docusate sodium (COLACE) 100 MG capsule, Take 100 mg by mouth every other day. , Disp: , Rfl:    EPINEPHrine 0.3 mg/0.3 mL IJ SOAJ injection, INJECT ONE AUTO-INJECTOR INTRAMUSCULARLY AS NEEDED, Disp: , Rfl:    estradiol (ESTRACE) 0.1 MG/GM vaginal cream, INSERT 1 GRAM VAGINALLY TWICE WEEKLY, Disp: 42.5 g, Rfl: 1   famotidine (PEPCID) 10 MG tablet, Take 10 mg by mouth daily. , Disp: , Rfl:    fexofenadine (ALLEGRA) 180 MG tablet, Take 180  mg by mouth 2 (two) times daily., Disp: , Rfl:    guaifenesin (HUMIBID E) 400 MG TABS tablet, Take 400 mg by mouth as needed., Disp: , Rfl:    hydrOXYzine (VISTARIL) 25 MG capsule, Take by mouth. 25-50 MG PRN, Disp: , Rfl:    IODINE EX, Apply 225 mcg topically. Every 3 days, Disp: , Rfl:    KETAMINE HCL SL, Place into right nostril as needed., Disp: , Rfl:    ketotifen (ZADITOR) 0.025 % ophthalmic solution, Place 1 drop into both eyes daily as needed., Disp: , Rfl:    Ketotifen Fumarate POWD, 1 mg by Does not apply route. 5-6 times a day, Disp: , Rfl:    L-Lysine 500 MG CAPS, Take by mouth 3 (three) times daily., Disp: , Rfl:    L-Theanine 100 MG CAPS, Take 200 mg by mouth daily. , Disp: , Rfl:    levocetirizine (XYZAL) 2.5 MG/5ML solution, as needed., Disp: , Rfl:    Light Mineral Oil-Mineral Oil 0.5-0.5 % EMUL, Apply 1 application to eye daily., Disp: , Rfl:    lipase/protease/amylase (CREON) 12000-38000 units CPEP capsule, Take 1-3 capsules (12,000-36,000 Units total) by mouth 3 (three) times daily before meals., Disp: 330 capsule, Rfl: 0   loperamide (IMODIUM) 2 MG capsule, Take by mouth as needed for diarrhea or loose stools., Disp: , Rfl:    Lutein-Zeaxanthin 25-5 MG CAPS, Take 1 capsule  by mouth every other day., Disp: , Rfl:    Magnesium Citrate POWD, Take 133 mg by mouth daily., Disp: , Rfl:    Melatonin 1 MG/ML LIQD, Take by mouth daily., Disp: , Rfl:    montelukast (SINGULAIR) 5 MG chewable tablet, Chew 5 mg by mouth in the morning and at bedtime., Disp: , Rfl:    Non Gelatin Capsules, Empty, (CAPSULE #3 CLEAR/CLEAR VEG) CAPS, Take 1 capsule by mouth daily., Disp: , Rfl:    NONFORMULARY OR COMPOUNDED ITEM, Bupivacaine nasal 0.5% drops w/o epi, Disp: , Rfl:    omega-3 acid ethyl esters (LOVAZA) 1 g capsule, TAKE 2 CAPSULES BY MOUTH 2 TIMES DAILY., Disp: 360 capsule, Rfl: 3   pseudoephedrine (SUDAFED) 30 MG tablet, Take 30 mg by mouth every 4 (four) hours as needed for congestion. ,  Disp: , Rfl:    pyridostigmine (MESTINON) 60 MG tablet, Take 15 mg by mouth. 3-4 times a day, Disp: , Rfl:    Quercetin 500 MG CAPS, Take by mouth daily., Disp: , Rfl:    Riboflavin (VITAMIN B-2 PO), Take 100 mg by mouth 3 (three) times daily., Disp: , Rfl:    Roflumilast (ZORYVE) 0.3 % CREA, Apply to elbows once daily., Disp: 60 g, Rfl: 2   Selenium 200 MCG CAPS, Take 1 capsule by mouth once a week., Disp: , Rfl:    simethicone (MYLICON) 80 MG chewable tablet, Chew 80 mg by mouth every 6 (six) hours as needed for flatulence., Disp: , Rfl:    Sodium Fluoride 1.1 % PSTE, Place onto teeth., Disp: , Rfl:    trolamine salicylate (ASPERCREME/ALOE) 10 % cream, Apply topically., Disp: , Rfl:    Ubrogepant (UBRELVY) 50 MG TABS, Take 50 mg by mouth as needed (for migriane). May repeat a dose in 2 hours if headache persists, Disp: 16 tablet, Rfl: 6   vitamin C (ASCORBIC ACID) 500 MG tablet, Take 500 mg by mouth daily., Disp: , Rfl:    Vitamin D, Cholecalciferol, 10 MCG (400 UNIT) TABS, Take by mouth daily. , Disp: , Rfl:   No results found.  No images are attached to the encounter.      Latest Ref Rng & Units 06/20/2022    7:13 AM  CMP  Glucose 70 - 99 mg/dL 99   BUN 6 - 24 mg/dL 10   Creatinine 0.57 - 1.00 mg/dL 0.67   Sodium 134 - 144 mmol/L 140   Potassium 3.5 - 5.2 mmol/L 4.0   Chloride 96 - 106 mmol/L 100   CO2 20 - 29 mmol/L 25   Calcium 8.7 - 10.2 mg/dL 9.9   Total Protein 6.0 - 8.5 g/dL 7.2   Total Bilirubin 0.0 - 1.2 mg/dL 0.6   Alkaline Phos 44 - 121 IU/L 83   AST 0 - 40 IU/L 23   ALT 0 - 32 IU/L 20       Latest Ref Rng & Units 04/28/2022    1:08 PM  CBC  WBC 4.0 - 10.5 K/uL 7.7   Hemoglobin 12.0 - 15.0 g/dL 12.2   Hematocrit 36.0 - 46.0 % 37.5   Platelets 150 - 400 K/uL 293      Observation/objective: Appears in no acute distress over video visit today.  Breathing is nonlabored  Assessment and plan: Patient is a 51 year old female with history of iron deficiency anemia  of unclear etiology.  This is a routine follow-up visit  Patient has had a EGD colonoscopy and capsule study in the  past.  She is getting colonoscopy roughly every 3 to 5 years and her last colonoscopy from 2022 was otherwise unremarkable.  She is not presently anemicToday with a hemoglobin of 12.9 and ferritin of 125.  Iron saturation was also normal at 21%.  She does not require any IV iron at this time.  CBC ferritin and iron studies in 3 months and I will see her back in 6 months for in person or video visit.  Follow-up instructions: As above  I discussed the assessment and treatment plan with the patient. The patient was provided an opportunity to ask questions and all were answered. The patient agreed with the plan and demonstrated an understanding of the instructions.   The patient was advised to call back or seek an in-person evaluation if the symptoms worsen or if the condition fails to improve as anticipated.  I provided 12 minutes of face-to-face video visit time during this encounter.  Time spent discussing results of lab work and coordinating future appointments.  Visit Diagnosis: No diagnosis found.  Dr. Randa Evens, MD, MPH [place] [facility] Tel- [phone] 09/05/2022 1:22 PM

## 2022-09-13 ENCOUNTER — Other Ambulatory Visit: Payer: Self-pay | Admitting: Family Medicine

## 2022-10-13 NOTE — Progress Notes (Signed)
PCP: Virginia Crews, MD   No chief complaint on file.   HPI:      Autumn Zhang is a 52 y.o. G1P0010 who LMP was No LMP recorded. Patient is postmenopausal., presents today for her annual examination.  Her menses are absent due to endometrial ablation due to menorrhagia/anemia (labs suggestive of menopause 5/21).  She does not have PMB. She does not have vasomotor sx. Hx of leiomyoma in past.  Sex activity: not sexually active due to pain/medical conditions. She does have vaginal dryness and uses estrace crm with some relief. Needs RF sent to [facility].  Last Pap: September 07, 2018  Results were: no abnormalities /neg HPV DNA.  Hx of STDs: none  Last mammogram: 04/14/22 at [facility] Results were: normal--routine follow-up in 12 months  There is a FH of breast cancer in her mat aunt who was BRCA neg. Strong FH cancers on both sides, and pt has personal hx of colon cancer age 5.  Had neg cancer genetic testing at [facility] in ~2015/2016 Cephus Shelling) with a POLB VUS in research trials. Being followed at [facility]. Pt did [facility] testing 1/20r with RPS20 VUS. IBIS=10.8%/riskscore=12%. The patient does not do self-breast exams.  Dr. Janese Banks wants to follow pt for Lynch cancers even though she had negative genetic testing. Needs yearly GYN u/s. Last one 1/23 was normal. Normal [place] Pt has been having LLQ pain since 10/22. Sx very severe at first for a week, had neg barium CT abd/pelvis 10/22 (can't have contrast). Severity has since decreased but still tender to touch at times.   She is current on colonoscopies; had one 11/22. Case being reviewed. Had stage 2 colon cancer with RT hemicolectomy 2016.  Tobacco use: The patient denies current or previous tobacco use. Alcohol use: none No drug use Exercise: very active usually  She does get adequate calcium and Vitamin D in her diet. Hx of osteopenia and mom has osteoporosis. She exercises regularly, as well as does PT for connective tissue disorder.    Labs with PCP/other MDs. Is complex medical pt.   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   Urinary frequency 09/26/2021   Atypical facial pain (1ry area of Pain) (Left) 08/29/2021   Chronic ear pain (Left) 08/29/2021   Temporal headache (Left) 08/29/2021   Inner ear pain (Left) 08/29/2021   Trigeminal nerve disorder (Left) 08/29/2021   History of photophobia 08/29/2021   TMJ (temporomandibular joint syndrome) 08/29/2021   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   Polypharmacy 08/29/2021   Severe frontal headaches 08/29/2021   Chronic occipital headache (Midline) (2ry area of Pain) 08/29/2021   Chronic neck pain (posterior) (4th area of Pain) (Bilateral) (L>R) 08/29/2021   Musculoskeletal disorder involving upper trapezius muscle 08/29/2021   Musculoskeletal disorder involving sternocleidomastoid (Left) 08/29/2021   Abnormal MRI, cervical spine (02/11/2019) 08/29/2021   Shoulder stiffness (Right) 08/29/2021   Pharmacologic therapy 08/27/2021   Disorder of skeletal system 08/27/2021   Problems influencing health status 08/27/2021   Anxiety, generalized 07/17/2021   Difficulty walking 07/17/2021   Headache disorder (3ry area of Pain) 07/17/2021   Photophobia 07/17/2021   Speech disturbance 07/17/2021   Tear film insufficiency 07/15/2021  Left lower quadrant abdominal pain 07/05/2021   Palpitations 06/11/2021   Axillary lymphadenopathy 06/11/2021   Immunosuppressed status ([place]) 04/19/2021   History of seronegative inflammatory arthritis 06/28/2020   Osteopenia of multiple sites 06/14/2020   Polyarthralgia 06/12/2020   Iodine deficiency  06/12/2020   OSA on CPAP 06/12/2020   Chronic pain syndrome 06/12/2020   Psychophysiological insomnia 11/26/2019   Calcific tendinitis of shoulder (Right) 04/10/2019   History of iron deficiency anemia 11/04/2018   History of basal cell carcinoma 10/07/2018   Vaginal dryness 09/07/2018   Chronic jaw pain (Left) 07/15/2018   Chronic migraine with aura 07/15/2018   Rectocele 05/21/2018   Ehlers-Danlos syndrome 03/15/2018   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   Cardiac arrhythmia 01/21/2018   Polyp of colon 01/21/2018   High cholesterol 01/21/2018   Chronic constipation 01/21/2018   Instability of shoulder joint (Right) 01/12/2018   Trigeminal neuralgia (V1-3) (Left) 12/25/2017   Instability of shoulder joint (Left) 12/25/2017   Nausea 12/25/2017   Occipital neuralgia 12/25/2017   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   Hiatal hernia 10/14/2017   TMJ (dislocation of temporomandibular joint) 09/29/2017   Irritable bowel syndrome 07/02/2017   Anal fissure 07/02/2017   Anemia 07/02/2017   Gastroesophageal reflux disease 07/02/2017   Hemorrhoids 07/02/2017   Malignant neoplasm of skin 07/02/2017   Malignant tumor of colon ([place]) 07/02/2017   Arthritis 07/02/2017   Fibromyalgia 07/02/2017   Pulsatile tinnitus of ear (Left) 12/12/2016   Restless leg syndrome 08/01/2016   Cervical dystonia 03/21/2016   Cervical spine disease 03/21/2016   Cervicalgia 02/06/2016   Paresthesia 02/06/2016   Hypercholesterolemia 12/24/2015   Iron deficiency anemia 10/18/2015   Vitamin D deficiency disease 10/18/2015   Midline low back pain without sciatica 02/06/2015   Hyperglycemia 10/30/2014   History of colon cancer 09/27/2014   Postural orthostatic tachycardia syndrome 09/16/2003   Chronic fatigue 09/15/2000   Asthma 09/15/1988   Dyspnea 09/15/1988    Past Surgical History:  Procedure Laterality Date   APPENDECTOMY     removed with colectomy    BREAST SURGERY     reduction   COLON SURGERY  2008   for colon cancer, ~1/3 of colon removed, primary reanastimosis   COLONOSCOPY WITH PROPOFOL N/A 10/07/2018   Procedure: COLONOSCOPY WITH PROPOFOL;  Surgeon: Lin Landsman, MD;  Location: [facility] ENDOSCOPY;  Service: Gastroenterology;  Laterality: N/A;   COLONOSCOPY WITH PROPOFOL N/A 08/14/2021   Procedure: COLONOSCOPY WITH PROPOFOL;  Surgeon: Lin Landsman, MD;  Location: [facility] ENDOSCOPY;  Service: Gastroenterology;  Laterality: N/A;   ENDOMETRIAL ABLATION     trigeminal stimulator      Family History  Problem Relation Age of Onset   Colon cancer Father 43   Breast cancer Maternal Aunt 25       BRCA neg   Endometrial cancer Maternal Aunt 50   Colon cancer Maternal Grandmother 75   Migraines Maternal Grandmother    Tremor Maternal Grandmother    Leukemia Maternal Grandfather 87   Food Allergy Maternal Grandfather    Migraines Mother    Glaucoma Mother    Cataracts Mother    Tremor Mother    Fibromyalgia Mother    Heart disease Mother    Colon polyps Brother        non-cancerous    Social History   Socioeconomic History   Marital status: Married    Spouse name: Not on file   Number of children: 0  Years of education: Not on file   Highest education level: Not on file  Occupational History   Occupation: SSI and disability  Tobacco Use   Smoking status: Former    Types: Cigarettes   Smokeless tobacco: Never   Tobacco comments:    Quit 30 yrs ago- only sometimes in college  Vaping Use   Vaping Use: Never used  Substance and Sexual Activity   Alcohol use: Never   Drug use: Never   Sexual activity: Not Currently    Birth control/protection: None  Other Topics Concern   Not on file  Social History Narrative   Not on file   Social Determinants of Health   Financial Resource Strain: Not on file  Food Insecurity: Not on file  Transportation Needs: Not on file  Physical Activity: Not on file  Stress:  Not on file  Social Connections: Not on file  Intimate Partner Violence: Not on file     Current Outpatient Medications:    acetaminophen (TYLENOL) 500 MG tablet, Take 500 mg by mouth every 6 (six) hours as needed., Disp: , Rfl:    albuterol (PROVENTIL HFA;VENTOLIN HFA) 108 (90 Base) MCG/ACT inhaler, Inhale 2 puffs into the lungs every 4 (four) hours as needed. , Disp: , Rfl:    aspirin 325 MG tablet, Take 325 mg by mouth., Disp: , Rfl:    Atogepant (QULIPTA) 10 MG TABS, Take 10 mg by mouth daily., Disp: 30 tablet, Rfl: 6   augmented betamethasone dipropionate (DIPROLENE-AF) 0.05 % cream, APPLY TWICE DAILY AS NEEDED TO THE AFFECTED AREAS FOR 5-7 DAYS FOR ECZEMA., Disp: , Rfl:    b complex vitamins capsule, Take 1 capsule by mouth daily. 1/4 capsule a day, Disp: , Rfl:    Blood Pressure Monitoring (BLOOD PRESSURE CUFF) MISC, Take blood pressure as needed for symptoms., Disp: , Rfl:    Boswellia Serrata (BOSWELLIA PO), Take 150 mg by mouth every morning. , Disp: , Rfl:    Calcium Citrate 200 MG TABS, Take 600 mg by mouth., Disp: , Rfl:    CREON 12000-38000 units CPEP capsule, TAKE 1-3 CAPSULES (12,000-36,000 UNITS TOTAL) BY MOUTH 3 (THREE) TIMES DAILY BEFORE MEALS., Disp: 330 capsule, Rfl: 0   cromolyn (GASTROCROM) 100 MG/5ML solution, Take by mouth 4 (four) times daily -  before meals and at bedtime., Disp: , Rfl:    Cyanocobalamin (VITAMIN B-12) 1000 MCG SUBL, Place under the tongue., Disp: , Rfl:    desloratadine (CLARINEX) 5 MG tablet, Take 5 mg by mouth as needed., Disp: , Rfl:    Diclofenac Sodium 1.5 % SOLN, 40 drops Externally Four times a day, Disp: , Rfl:    diphenhydrAMINE (BENADRYL) 12.5 MG/5ML liquid, Take by mouth 4 (four) times daily as needed., Disp: , Rfl:    diphenhydrAMINE-Allantoin 2-0.5 % CREA, Apply 1 application topically as needed. , Disp: , Rfl:    docusate sodium (COLACE) 100 MG capsule, Take 100 mg by mouth every other day. , Disp: , Rfl:    EPINEPHrine 0.3 mg/0.3 mL  IJ SOAJ injection, INJECT ONE AUTO-INJECTOR INTRAMUSCULARLY AS NEEDED, Disp: , Rfl:    estradiol (ESTRACE) 0.1 MG/GM vaginal cream, INSERT 1 GRAM VAGINALLY TWICE WEEKLY, Disp: 42.5 g, Rfl: 1   famotidine (PEPCID) 10 MG tablet, Take 10 mg by mouth daily. , Disp: , Rfl:    fexofenadine (ALLEGRA) 180 MG tablet, Take 180 mg by mouth 2 (two) times daily., Disp: , Rfl:    guaifenesin (HUMIBID E) 400 MG TABS tablet,  Take 400 mg by mouth as needed., Disp: , Rfl:    hydrOXYzine (VISTARIL) 25 MG capsule, Take by mouth. 25-50 MG PRN, Disp: , Rfl:    IODINE EX, Apply 225 mcg topically. Every 3 days, Disp: , Rfl:    KETAMINE HCL SL, Place into right nostril as needed., Disp: , Rfl:    ketotifen (ZADITOR) 0.025 % ophthalmic solution, Place 1 drop into both eyes daily as needed., Disp: , Rfl:    Ketotifen Fumarate POWD, 1 mg by Does not apply route. 5-6 times a day, Disp: , Rfl:    L-Lysine 500 MG CAPS, Take by mouth 3 (three) times daily., Disp: , Rfl:    L-Theanine 100 MG CAPS, Take 200 mg by mouth daily. , Disp: , Rfl:    levocetirizine (XYZAL) 2.5 MG/5ML solution, as needed., Disp: , Rfl:    Light Mineral Oil-Mineral Oil 0.5-0.5 % EMUL, Apply 1 application to eye daily., Disp: , Rfl:    loperamide (IMODIUM) 2 MG capsule, Take by mouth as needed for diarrhea or loose stools., Disp: , Rfl:    Lutein-Zeaxanthin 25-5 MG CAPS, Take 1 capsule by mouth every other day., Disp: , Rfl:    Magnesium Citrate POWD, Take 133 mg by mouth daily., Disp: , Rfl:    Melatonin 1 MG/ML LIQD, Take by mouth daily., Disp: , Rfl:    montelukast (SINGULAIR) 5 MG chewable tablet, Chew 5 mg by mouth in the morning and at bedtime., Disp: , Rfl:    Non Gelatin Capsules, Empty, (CAPSULE #3 CLEAR/CLEAR VEG) CAPS, Take 1 capsule by mouth daily., Disp: , Rfl:    NONFORMULARY OR COMPOUNDED ITEM, Bupivacaine nasal 0.5% drops w/o epi, Disp: , Rfl:    omega-3 acid ethyl esters (LOVAZA) 1 g capsule, TAKE 2 CAPSULES BY MOUTH 2 TIMES DAILY., Disp:  360 capsule, Rfl: 3   pseudoephedrine (SUDAFED) 30 MG tablet, Take 30 mg by mouth every 4 (four) hours as needed for congestion. , Disp: , Rfl:    pyridostigmine (MESTINON) 60 MG tablet, Take 15 mg by mouth. 3-4 times a day, Disp: , Rfl:    Quercetin 500 MG CAPS, Take by mouth daily., Disp: , Rfl:    Riboflavin (VITAMIN B-2 PO), Take 100 mg by mouth 3 (three) times daily., Disp: , Rfl:    Roflumilast (ZORYVE) 0.3 % CREA, Apply to elbows once daily., Disp: 60 g, Rfl: 2   Selenium 200 MCG CAPS, Take 1 capsule by mouth once a week., Disp: , Rfl:    simethicone (MYLICON) 80 MG chewable tablet, Chew 80 mg by mouth every 6 (six) hours as needed for flatulence., Disp: , Rfl:    Sodium Fluoride 1.1 % PSTE, Place onto teeth., Disp: , Rfl:    trolamine salicylate (ASPERCREME/ALOE) 10 % cream, Apply topically., Disp: , Rfl:    Ubrogepant (UBRELVY) 50 MG TABS, Take 50 mg by mouth as needed (for migriane). May repeat a dose in 2 hours if headache persists, Disp: 16 tablet, Rfl: 6   vitamin C (ASCORBIC ACID) 500 MG tablet, Take 500 mg by mouth daily., Disp: , Rfl:    Vitamin D, Cholecalciferol, 10 MCG (400 UNIT) TABS, Take by mouth daily. , Disp: , Rfl:      ROS:  Review of Systems  Constitutional:  Positive for fatigue. Negative for fever and unexpected weight change.  Respiratory:  Positive for shortness of breath. Negative for cough and wheezing.   Cardiovascular:  Positive for chest pain and palpitations. Negative for leg  swelling.  Gastrointestinal:  Positive for constipation, diarrhea and nausea. Negative for blood in stool and vomiting.  Endocrine: Positive for cold intolerance, heat intolerance and polyuria.  Genitourinary:  Positive for dyspareunia, frequency, pelvic pain and urgency. Negative for dysuria, flank pain, genital sores, hematuria, menstrual problem, vaginal bleeding, vaginal discharge and vaginal pain.  Musculoskeletal:  Positive for arthralgias, back pain, joint swelling and  myalgias.  Skin:  Negative for rash.  Neurological:  Positive for dizziness, light-headedness, numbness and headaches. Negative for syncope.  Hematological:  Negative for adenopathy.  Psychiatric/Behavioral:  Negative for agitation, confusion, sleep disturbance and suicidal ideas. The patient is not nervous/anxious.   BREAST: No symptoms    Objective: There were no vitals taken for this visit.   Physical Exam Constitutional:      Appearance: She is well-developed.  Genitourinary:     Vulva normal.     Right Labia: No rash, tenderness or lesions.    Left Labia: No tenderness, lesions or rash.    No vaginal discharge, erythema or tenderness.      Right Adnexa: not tender and no mass present.    Left Adnexa: tender.    Left Adnexa: no mass present.    No cervical friability or polyp.     Uterus is not enlarged or tender.  Breasts:    Right: No mass, nipple discharge, skin change or tenderness.     Left: No mass, nipple discharge, skin change or tenderness.  Neck:     Thyroid: No thyromegaly.  Cardiovascular:     Rate and Rhythm: Normal rate and regular rhythm.     Heart sounds: Normal heart sounds. No murmur heard. Pulmonary:     Effort: Pulmonary effort is normal.     Breath sounds: Normal breath sounds.  Abdominal:     Palpations: Abdomen is soft.     Tenderness: There is abdominal tenderness in the left lower quadrant. There is no guarding or rebound.    Musculoskeletal:        General: Normal range of motion.     Cervical back: Normal range of motion.  Lymphadenopathy:     Cervical: No cervical adenopathy.  Neurological:     General: No focal deficit present.     Mental Status: She is alert and oriented to person, place, and time.     Cranial Nerves: No cranial nerve deficit.  Skin:    General: Skin is warm and dry.  Psychiatric:        Mood and Affect: Mood normal.        Behavior: Behavior normal.        Thought Content: Thought content normal.         Judgment: Judgment normal.  Vitals reviewed.     Assessment/Plan:  Encounter for annual routine gynecological examination  Encounter for screening mammogram for malignant neoplasm of breast; pt current on mammo with PCP  History of colon cancer--followed by GI; will check with [facility] to see if pt qualifies for update testing  Screening for ovarian cancer - Plan: US PELVIS TRANSVAGINAL NON-OB (TV ONLY)  LLQ pain - Plan: US PELVIS TRANSVAGINAL NON-OB (TV ONLY); check GYN u/s for LLQ pain. If neg, not GYN and question MSK given location and neg imaging. Will f/u with results.   Vaginal dryness - Rx RF estrace crm to [facility].  - Plan: estradiol (ESTRACE) 0.1 MG/GM vaginal cream,    No orders of the defined types were placed in this encounter.  GYN counsel breast self exam, mammography screening, menopause, adequate intake of calcium and vitamin D, diet and exercise    F/U  No follow-ups on file.  Rodnesha Elie B. Vasily Fedewa, PA-C 10/13/2022 7:15 PM

## 2022-10-14 ENCOUNTER — Other Ambulatory Visit (HOSPITAL_COMMUNITY)
Admission: RE | Admit: 2022-10-14 | Discharge: 2022-10-14 | Disposition: A | Discharge status: Home / Self Care | Payer: Medicare Other | Source: Ambulatory Visit | Attending: Obstetrics and Gynecology | Admitting: Obstetrics and Gynecology

## 2022-10-14 ENCOUNTER — Encounter: Payer: Self-pay | Admitting: Obstetrics and Gynecology

## 2022-10-14 ENCOUNTER — Ambulatory Visit (INDEPENDENT_AMBULATORY_CARE_PROVIDER_SITE_OTHER): Payer: Medicare Other | Admitting: Obstetrics and Gynecology

## 2022-10-14 VITALS — BP 100/68 | Ht 61.75 in | Wt 144.0 lb

## 2022-10-14 DIAGNOSIS — Z124 Encounter for screening for malignant neoplasm of cervix: Secondary | ICD-10-CM | POA: Diagnosis present

## 2022-10-14 DIAGNOSIS — Z01419 Encounter for gynecological examination (general) (routine) without abnormal findings: Secondary | ICD-10-CM | POA: Diagnosis present

## 2022-10-14 DIAGNOSIS — N898 Other specified noninflammatory disorders of vagina: Secondary | ICD-10-CM

## 2022-10-14 DIAGNOSIS — Z01411 Encounter for gynecological examination (general) (routine) with abnormal findings: Secondary | ICD-10-CM | POA: Diagnosis not present

## 2022-10-14 DIAGNOSIS — Z1151 Encounter for screening for human papillomavirus (HPV): Secondary | ICD-10-CM

## 2022-10-14 DIAGNOSIS — Z1231 Encounter for screening mammogram for malignant neoplasm of breast: Secondary | ICD-10-CM

## 2022-10-14 DIAGNOSIS — Z1273 Encounter for screening for malignant neoplasm of ovary: Secondary | ICD-10-CM

## 2022-10-14 DIAGNOSIS — R399 Unspecified symptoms and signs involving the genitourinary system: Secondary | ICD-10-CM

## 2022-10-14 DIAGNOSIS — Z85038 Personal history of other malignant neoplasm of large intestine: Secondary | ICD-10-CM

## 2022-10-14 LAB — POCT URINALYSIS DIPSTICK
Bilirubin, UA: NEGATIVE
Blood, UA: NEGATIVE
Glucose, UA: NEGATIVE
Ketones, UA: NEGATIVE
Nitrite, UA: NEGATIVE
Protein, UA: NEGATIVE
Spec Grav, UA: 1.01 (ref 1.010–1.025)
pH, UA: 6 (ref 5.0–8.0)

## 2022-10-14 MED ORDER — ESTRADIOL 0.1 MG/GM VA CREA: TOPICAL_CREAM | VAGINAL | 1 refills | Status: DC

## 2022-10-14 NOTE — Patient Instructions (Signed)
I value your feedback and you entrusting us with your care. If you get a [place] patient survey, I would appreciate you taking the time to let us know about your experience today. Thank you! ? ? ?

## 2022-10-16 ENCOUNTER — Other Ambulatory Visit: Payer: Self-pay

## 2022-10-16 LAB — URINE CULTURE: Organism ID, Bacteria: NO GROWTH

## 2022-10-17 ENCOUNTER — Telehealth: Payer: Self-pay

## 2022-10-17 NOTE — Progress Notes (Unsigned)
I,Autumn Zhang,acting as a scribe for Autumn Paganini, MD.,have documented all relevant documentation on the behalf of Autumn Paganini, MD,as directed by  Autumn Paganini, MD while in the presence of Autumn Paganini, MD.     Established patient visit   Patient: Autumn Zhang   DOB: 01/24/1971   52 y.o. Female  MRN: [id number] Visit Date: 10/20/2022  Today's healthcare provider: [name], MD   Chief Complaint  Patient presents with   Anxiety   Subjective    Anxiety Presents for initial visit. Episode onset: December. The problem has been gradually worsening. Symptoms include confusion, decreased concentration, excessive worry, insomnia, irritability, malaise, muscle tension, nausea, nervous/anxious behavior, obsessions, palpitations, panic and restlessness. Patient reports no chest pain. Symptoms occur constantly. The severity of symptoms is severe. The symptoms are aggravated by family issues.    a kid that she has been close to for many years who has now gone to prison. She is struggling with this emotionally.  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   Medications: Outpatient Medications Prior to Visit  Medication Sig   acetaminophen (TYLENOL) 500 MG tablet Take 500 mg by mouth every 6 (six) hours as needed.   albuterol (PROVENTIL HFA;VENTOLIN HFA) 108 (90 Base) MCG/ACT inhaler Inhale 2 puffs into the lungs every 4 (four) hours as needed.    aspirin 325 MG tablet Take 325 mg by mouth.   Atogepant (QULIPTA) 10 MG TABS Take 10 mg by mouth daily.   azelastine (ASTELIN) 0.1 % nasal spray Place into both nostrils as needed for rhinitis. Use in each nostril as directed   Blood Pressure Monitoring (BLOOD PRESSURE CUFF) MISC Take blood pressure as needed for symptoms.   Boswellia Serrata (BOSWELLIA PO) Take 150 mg by mouth every morning.    bupivacaine (MARCAINE) 0.5 % injection as directed Injection   Calcium Citrate 200 MG  TABS Take 600 mg by mouth.   CREON 12000-38000 units CPEP capsule TAKE 1-3 CAPSULES (12,000-36,000 UNITS TOTAL) BY MOUTH 3 (THREE) TIMES DAILY BEFORE MEALS.   cromolyn (GASTROCROM) 100 MG/5ML solution Take by mouth 4 (four) times daily -  before meals and at bedtime.   Cyanocobalamin (VITAMIN B-12) 1000 MCG SUBL Place under the tongue.   desloratadine (CLARINEX) 5 MG tablet Take 5 mg by mouth as needed.   dexamethasone (DECADRON) 1 MG tablet Take 1 mg by mouth at bedtime.   Diclofenac Sodium 1.5 % SOLN 40 drops Externally Four times a day   diphenhydrAMINE (BENADRYL) 12.5 MG/5ML liquid Take by mouth 4 (four) times daily as needed.   diphenhydrAMINE-Allantoin 2-0.5 % CREA Apply 1 application topically as needed.    docusate sodium (COLACE) 100 MG capsule Take 100 mg by mouth every other day.    EPINEPHrine 0.3 mg/0.3 mL IJ SOAJ injection INJECT ONE AUTO-INJECTOR INTRAMUSCULARLY AS NEEDED   estradiol (ESTRACE) 0.1 MG/GM vaginal cream INSERT 1 GRAM VAGINALLY TWICE WEEKLY   famotidine (PEPCID) 10 MG tablet Take 10 mg by mouth daily.    fexofenadine (ALLEGRA) 180 MG tablet Take 180 mg by mouth 2 (two) times daily.   guaifenesin (HUMIBID E) 400 MG TABS tablet Take 400 mg by mouth as needed.   hydrOXYzine (ATARAX) 25 MG tablet Take 25 mg by mouth as needed.   IODINE EX Apply 225 mcg topically. Every 3 days   KETAMINE HCL SL Place into right nostril as needed.   ketotifen (ZADITOR) 0.025 % ophthalmic solution Place 1 drop into both eyes  daily as needed.   Ketotifen Fumarate POWD 0.5 mg by Does not apply route. 6-12 times a day   L-Lysine 500 MG CAPS Take by mouth 3 (three) times daily.   L-Lysine HCl 500 MG CAPS Take by mouth.   L-Theanine 100 MG CAPS Take 200 mg by mouth as needed.   levocetirizine (XYZAL) 2.5 MG/5ML solution as needed.   Light Mineral Oil-Mineral Oil 0.5-0.5 % EMUL Apply 1 application to eye daily.   loperamide (IMODIUM) 2 MG capsule Take by mouth as needed for diarrhea or loose  stools.   Lutein-Zeaxanthin 25-5 MG CAPS Take 1 capsule by mouth every other day.   Magnesium Citrate POWD Take 100 mg by mouth daily.   Melatonin 1 MG/ML LIQD Take by mouth daily.   montelukast (SINGULAIR) 5 MG chewable tablet Chew 5 mg by mouth in the morning and at bedtime.   Non Gelatin Capsules, Empty, (CAPSULE #3 CLEAR/CLEAR VEG) CAPS Take 1 capsule by mouth daily.   NONFORMULARY OR COMPOUNDED ITEM Bupivacaine nasal 0.5% drops w/o epi   omega-3 acid ethyl esters (LOVAZA) 1 g capsule TAKE 2 CAPSULES BY MOUTH 2 TIMES DAILY.   pseudoephedrine (SUDAFED) 30 MG tablet Take 30 mg by mouth every 4 (four) hours as needed for congestion.    pyridostigmine (MESTINON) 60 MG tablet Take 15 mg by mouth. 3-4 times a day   Quercetin 500 MG CAPS Take by mouth as needed.   Riboflavin (VITAMIN B-2 PO) Take 100 mg by mouth 3 (three) times daily.   Roflumilast (ZORYVE) 0.3 % CREA Apply to elbows once daily.   Selenium 200 MCG CAPS Take 1 capsule by mouth. Twice a month   simethicone (MYLICON) 80 MG chewable tablet Chew 80 mg by mouth every 6 (six) hours as needed for flatulence.   Sodium Fluoride 1.1 % PSTE Place onto teeth.   Suvorexant (BELSOMRA) 5 MG TABS 1 tablet at bedtime as needed Orally Once a day   trolamine salicylate (ASPERCREME/ALOE) 10 % cream Apply topically.   Ubrogepant (UBRELVY) 50 MG TABS Take 50 mg by mouth as needed (for migriane). May repeat a dose in 2 hours if headache persists   Varenicline Tartrate (TYRVAYA) 0.03 MG/ACT SOLN Place into the nose.   vitamin C (ASCORBIC ACID) 500 MG tablet Take 500 mg by mouth daily.   Vitamin D, Cholecalciferol, 10 MCG (400 UNIT) TABS Take by mouth daily.    [DISCONTINUED] augmented betamethasone dipropionate (DIPROLENE-AF) 0.05 % cream APPLY TWICE DAILY AS NEEDED TO THE AFFECTED AREAS FOR 5-7 DAYS FOR ECZEMA. (Patient not taking: Reported on 10/20/2022)   [DISCONTINUED] b complex vitamins capsule Take 1 capsule by mouth daily. 1/4 capsule a day (Patient  not taking: Reported on 10/20/2022)   No facility-administered medications prior to visit.    Review of Systems  Constitutional:  Positive for fatigue and irritability.  Respiratory:  Negative for chest tightness.   Cardiovascular:  Positive for palpitations. Negative for chest pain.  Gastrointestinal:  Positive for nausea.  Psychiatric/Behavioral:  Positive for agitation, confusion, decreased concentration and sleep disturbance. The patient is nervous/anxious, has insomnia and is hyperactive.        Objective    BP 107/70 (BP Location: Left Arm, Patient Position: Sitting, Cuff Size: Normal)   Pulse 88   Temp 97.8 F (36.6 C) (Temporal)   Resp 16   Wt 142 lb 8 oz (64.6 kg)   BMI 26.28 kg/m    Physical Exam Vitals reviewed.  Constitutional:  General: She is not in acute distress.    Appearance: Normal appearance. She is well-developed. She is not diaphoretic.  HENT:     Head: Normocephalic and atraumatic.  Eyes:     General: No scleral icterus.    Conjunctiva/sclera: Conjunctivae normal.  Neck:     Thyroid: No thyromegaly.  Cardiovascular:     Rate and Rhythm: Normal rate and regular rhythm.     Pulses: Normal pulses.     Heart sounds: Normal heart sounds. No murmur heard. Pulmonary:     Effort: Pulmonary effort is normal. No respiratory distress.     Breath sounds: Normal breath sounds. No wheezing, rhonchi or rales.  Musculoskeletal:     Cervical back: Neck supple.     Right lower leg: No edema.     Left lower leg: No edema.  Lymphadenopathy:     Cervical: No cervical adenopathy.  Skin:    General: Skin is warm and dry.     Findings: No rash.  Neurological:     Mental Status: She is alert and oriented to person, place, and time. Mental status is at baseline.  Psychiatric:        Mood and Affect: Mood is anxious.        Behavior: Behavior normal.       No results found for any visits on 10/20/22.  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      I, Autumn Paganini, MD, have reviewed all documentation for this visit. The documentation on 10/20/22 for the exam, diagnosis, procedures, and orders are all accurate and complete.   Bacigalupo, Dionne Bucy, MD, MPH [facility]

## 2022-10-17 NOTE — Telephone Encounter (Signed)
Patient has appointment with our office on 10/22/2022 and she saw [facility] on 12/04/2021. Called and left a message for call back to find out why patient went and saw [facility] and why she is transferring her care back to [place]

## 2022-10-17 NOTE — Telephone Encounter (Signed)
Patient states she just had a consult wit [facility] back in March she wants to keep her care with [place]. Is this okay she see [place] on 10/22/2022

## 2022-10-20 ENCOUNTER — Encounter: Payer: Self-pay | Admitting: Family Medicine

## 2022-10-20 ENCOUNTER — Ambulatory Visit (INDEPENDENT_AMBULATORY_CARE_PROVIDER_SITE_OTHER): Payer: Medicare Other | Admitting: Family Medicine

## 2022-10-20 VITALS — BP 107/70 | HR 88 | Temp 97.8°F | Resp 16 | Wt 142.5 lb

## 2022-10-20 DIAGNOSIS — F411 Generalized anxiety disorder: Secondary | ICD-10-CM

## 2022-10-20 MED ORDER — BUSPIRONE HCL 5 MG PO TABS: 5.0000 mg | ORAL_TABLET | Freq: Two times a day (BID) | ORAL | 1 refills | Status: DC

## 2022-10-20 NOTE — Assessment & Plan Note (Signed)
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

## 2022-10-21 LAB — CYTOLOGY - PAP
Comment: NEGATIVE
Diagnosis: NEGATIVE
Diagnosis: REACTIVE
High risk HPV: NEGATIVE

## 2022-10-22 ENCOUNTER — Encounter: Payer: Self-pay | Admitting: Gastroenterology

## 2022-10-22 ENCOUNTER — Ambulatory Visit (INDEPENDENT_AMBULATORY_CARE_PROVIDER_SITE_OTHER): Payer: Medicare Other | Admitting: Gastroenterology

## 2022-10-22 VITALS — BP 138/81 | HR 82 | Temp 98.1°F | Ht 61.75 in | Wt 142.5 lb

## 2022-10-22 DIAGNOSIS — K8681 Exocrine pancreatic insufficiency: Secondary | ICD-10-CM | POA: Diagnosis not present

## 2022-10-22 NOTE — Progress Notes (Signed)
Autumn Darby, MD [address]  [place]  [place], [place] [address]  Main: [phone]  Fax: [phone]    Gastroenterology Consultation  Referring Provider:     Virginia Crews, MD Primary Care Physician:  Autumn Crews, MD Primary Gastroenterologist:  Dr. Cephas Zhang Reason for Consultation: EPI        HPI:   Autumn Zhang is a 52 y.o. Caucasian female referred by Dr. Brita Zhang, Autumn Bucy, MD  for consultation & management of constellation of upper and lower GI symptoms.  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  She underwent right hemicolectomy which revealed poorly differentiated invasive adenocarcinoma T3N0. She received 12 cycles of adjuvant chemotherapy with 5-FU leucovorin. 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 her father died of colon cancer at the age of 34.  Her only brother has also had colon polyps.  She has therefore been managed as a Lynch syndrome patient despite not having genetic features suggestive of Lynch syndrome.  She took prophylactic aspirin for a while and then she stopped taking it.  She has been getting colonoscopies every 1 to 2 years.  Her last colonoscopy was in 2018 at outside hospital.  Patient has history of Ehlers-Danlos syndrome.  Currently, patient is going through migraine attack and waiting glasses and he hat.  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  Moved from [place] to [place] and here to establish care for her ongoing GI symptoms.  Previously, she saw Dr. Carlton Zhang at [facility], Dr Autumn Zhang at [facility] in [place].  Her GI symptoms include pelvic floor dyssynergia, chronic constipation although having several bowel movements daily with incomplete emptying, nonbloody, abdominal bloating.  She is diagnosed with mild pelvic floor dyssynergia based on MRI  defecography, and mild rectocele at [facility] in 05/2018.  She also underwent smart pill study at [facility] in 08/2018 which revealed normal small bowel transit time.  She underwent pelvic floor PT in [place] and did not find it helpful.  Her most concerning symptom today is frequent soft bowel movements anywhere from 5 to 6/day, nonbloody associated with significant abdominal bloating.  She was told that she had considered based on the imaging studies and therefore tried linaclotide in the past and did not tolerate it well.   She has history of heartburn, epigastric pain, regurgitation which are manageable.  Her weight has been stable.  She underwent nuclear medicine gastric emptying study which was normal in 03/2018.  Patient is also here to discuss about surveillance colonoscopy given her history of colon cancer and treating as Lynch syndrome.  She reports undergoing surveillance EGD in 2016 which was reportedly normal.  Patient reports that she has history of iron deficiency anemia but her most recent labs returned unremarkable including normal iron studies.  She takes iron, B12, vitamin C, calcium and vitamin D.  She has history of mast cell activation syndrome for which she takes cromolyn sodium and scheduled to see allergy immunology at [facility].  Patient reports that she has several intolerances and allergies to medications and is very careful about trying new medication.  Follow-up visit 11/02/2018 She underwent colonoscopy which revealed diminutive tubular adenoma only.  Otherwise it was unremarkable.  Today, she wanted to discuss about chronic left upper abdominal pain that she  has been dealing with for last 2 to 3 years.  She sees eating fatty foods triggers the pain, sometimes laying on the left side makes the pain worse.  She states that is mild discomfort, nagging, more of an annoyance than anything.  The pain does not interfere with her daily life.  She had  cross-sectional imaging which was unremarkable.  She also had EGD which was unremarkable.  She would like to know if there is anything that she could try.  She tried Amitiza 8 MCG 2 times a day which resulted in constipation.  She is taking Dulcolax as needed.  She brings in with her a stool diary where she reports that her stools are variable consistency, sometimes dark, restarted oral iron.  Her weight has been stable.  She is currently on amitriptyline 10 mg at bedtime.  She would like to know if she has fat malabsorption because she notices that sometimes her stools float in the toilet bowl and she has significant fat craving.  She said she had pancreatic fecal elastase done in the past and it was negative.  Follow-up video visit 03/04/2019 Sayre reports that she has tried low-dose Creon and Zenpep which resulted in massive reactivation.  Therefore, she stopped taking pancreatic enzymes.  She continues to have mild degree of chronic left upper quadrant discomfort.  Her diet consists of mostly blended, liquid food.  She is trying to eat chicken but not able to tolerate it well.  Her weight has been stable.  She continues to have increased bowel frequency. She is currently on amitriptyline 10 mg daily.  She also has appointment with [facility] that she is planning to keep it.  She did not see the dietitian at [facility] referred by her allergy specialist. 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  She could not open her mouth wide enough to fit even the pediatric bite block.  Follow-up visit 07/16/2021 Patient is here for follow-up of her GI symptoms.  She was followed by Dr. Gunnar Zhang at [facility] for 2 years.  She decided to stay local and made a follow-up appointment to see me today. Patient has stayed on low-dose Creon.  She reports that even though her stools are loose and frequent, she has constant feeling of incomplete emptying.  She is not able to lose weight even though  her calorie restriction is between 1600 to 1700 cal/day.  She reports severe abdominal distention.  She does report left-sided abdominal discomfort.  She also wants to undergo colonoscopy for surveillance of colon cancer.  Her most recent labs are unremarkable  Follow-up visit 10/22/2022 Patient is here for follow-up of various GI symptoms.  She was seen by Dr. Donnie Mesa at [facility] and she was recommended to undergo colonoscopy every 3 years.  Patient wants to know what she could take when she has episodes of gastroparesis.  Discussed about Reglan.  She is managing pancreatic insufficiency well with enzyme replacement therapy.  She does not smoke or drink alcohol  NSAIDs: None  Antiplts/Anticoagulants/Anti thrombotics: None  GI Procedures:  Colonoscopy in 2018 by Dr Gala Lewandowsky   Colonoscopy 10/07/2018 - Patent end-to-side ileo-colonic anastomosis, characterized by healthy appearing mucosa. - Two diminutive polyps in the transverse colon, removed with a cold biopsy forceps. Resected and retrieved. - The distal rectum and anal verge are normal on retroflexion view. - The examination was otherwise normal. - The examined portion of the ileum was normal.  DIAGNOSIS:  A.  COLON POLYP X2, TRANSVERSE; COLD BIOPSY:  - TUBULAR ADENOMA (1).  - COLONIC MUCOSA WITH FOCAL LYMPHOID AGGREGATE (1).  - NEGATIVE FOR HIGH-GRADE DYSPLASIA AND MALIGNANCY.   Past Medical History:  Diagnosis Date   Allergy    Anemia    Anxiety    Arthritis    Asthma    Attention deficit hyperactivity disorder (ADHD), combined type 10/18/2015   Diagnosed many years ago.  Has been doing well on Ritalin '5mg'$  QID to six times a day. Long acting hasn't worked well in the past and she has not wanted to try Adderall. She is taking wellbutrin as well, but she has been weaning medication due to SE migraine.  She is now down to Wellbutrin one quarter tablet daily.  Last Assessment & Plan:  A/P: Stable.  Continue current dosing.  Refilled 3  months   Basal cell carcinoma 05/16/2021   Superficial, R post lower leg. [facility] [place], [place]   Basal cell carcinoma 09/03/2006   L chest, R forearm. [place], [place]   BRCA negative 09/2018   2015 [place] at [facility]; has VUS; 1/20 MyRisk neg except RPS20 VUS; IBIS=10.8%/riskscore=12%   Colon cancer ([place])    Dysautonomia ([place])    Dysplastic nevus 05/28/2009   right lateral back, [facility] [place], [place]   Ehlers-Danlos syndrome type III    Family history of colon cancer    GERD (gastroesophageal reflux disease)    Hyperlipidemia    Idiopathic small fiber peripheral neuropathy    Leiomyoma    Mast cell activation ([place])    Migraines    Neck pain    Occipital neuralgia    Sleep apnea     Past Surgical History:  Procedure Laterality Date   APPENDECTOMY     removed with colectomy   BREAST SURGERY     reduction   COLON SURGERY  2008   for colon cancer, ~1/3 of colon removed, primary reanastimosis   COLONOSCOPY WITH PROPOFOL N/A 10/07/2018   Procedure: COLONOSCOPY WITH PROPOFOL;  Surgeon: Lin Landsman, MD;  Location: [facility] ENDOSCOPY;  Service: Gastroenterology;  Laterality: N/A;   COLONOSCOPY WITH PROPOFOL N/A 08/14/2021   Procedure: COLONOSCOPY WITH PROPOFOL;  Surgeon: Lin Landsman, MD;  Location: [facility] ENDOSCOPY;  Service: Gastroenterology;  Laterality: N/A;   ENDOMETRIAL ABLATION     trigeminal stimulator      Current Outpatient Medications:    acetaminophen (TYLENOL) 500 MG tablet, Take 500 mg by mouth every 6 (six) hours as needed., Disp: , Rfl:    albuterol (PROVENTIL HFA;VENTOLIN HFA) 108 (90 Base) MCG/ACT inhaler, Inhale 2 puffs into the lungs every 4 (four) hours as needed. , Disp: , Rfl:    aspirin 325 MG tablet, Take 325 mg by mouth., Disp: , Rfl:    Atogepant (QULIPTA) 10 MG TABS, Take 10 mg by mouth daily., Disp: 30 tablet, Rfl: 6   azelastine (ASTELIN) 0.1 % nasal spray, Place into both nostrils as needed for rhinitis.  Use in each nostril as directed, Disp: , Rfl:    Blood Pressure Monitoring (BLOOD PRESSURE CUFF) MISC, Take blood pressure as needed for symptoms., Disp: , Rfl:    Boswellia Serrata (BOSWELLIA PO), Take 150 mg by mouth every morning. , Disp: , Rfl:    bupivacaine (MARCAINE) 0.5 % injection, as directed Injection, Disp: , Rfl:    busPIRone (BUSPAR) 5 MG tablet, Take 1 tablet (5 mg total) by mouth 2 (two) times daily., Disp: 60 tablet,  Rfl: 1   Calcium Citrate 200 MG TABS, Take 600 mg by mouth., Disp: , Rfl:    CREON 12000-38000 units CPEP capsule, TAKE 1-3 CAPSULES (12,000-36,000 UNITS TOTAL) BY MOUTH 3 (THREE) TIMES DAILY BEFORE MEALS., Disp: 330 capsule, Rfl: 0   cromolyn (GASTROCROM) 100 MG/5ML solution, Take by mouth 4 (four) times daily -  before meals and at bedtime., Disp: , Rfl:    Cyanocobalamin (VITAMIN B-12) 1000 MCG SUBL, Place under the tongue., Disp: , Rfl:    desloratadine (CLARINEX) 5 MG tablet, Take 5 mg by mouth as needed., Disp: , Rfl:    dexamethasone (DECADRON) 1 MG tablet, Take 1 mg by mouth at bedtime., Disp: , Rfl:    Diclofenac Sodium 1.5 % SOLN, 40 drops Externally Four times a day, Disp: , Rfl:    diphenhydrAMINE (BENADRYL) 12.5 MG/5ML liquid, Take by mouth 4 (four) times daily as needed., Disp: , Rfl:    diphenhydrAMINE-Allantoin 2-0.5 % CREA, Apply 1 application topically as needed. , Disp: , Rfl:    docusate sodium (COLACE) 100 MG capsule, Take 100 mg by mouth every other day. , Disp: , Rfl:    EPINEPHrine 0.3 mg/0.3 mL IJ SOAJ injection, INJECT ONE AUTO-INJECTOR INTRAMUSCULARLY AS NEEDED, Disp: , Rfl:    estradiol (ESTRACE) 0.1 MG/GM vaginal cream, INSERT 1 GRAM VAGINALLY TWICE WEEKLY, Disp: 42.5 g, Rfl: 1   famotidine (PEPCID) 10 MG tablet, Take 10 mg by mouth daily. , Disp: , Rfl:    fexofenadine (ALLEGRA) 180 MG tablet, Take 180 mg by mouth 2 (two) times daily., Disp: , Rfl:    guaifenesin (HUMIBID E) 400 MG TABS tablet, Take 400 mg by mouth as needed., Disp: ,  Rfl:    hydrOXYzine (ATARAX) 25 MG tablet, Take 25 mg by mouth as needed., Disp: , Rfl:    IODINE EX, Apply 225 mcg topically. Every 3 days, Disp: , Rfl:    KETAMINE HCL SL, Place into right nostril as needed., Disp: , Rfl:    ketotifen (ZADITOR) 0.025 % ophthalmic solution, Place 1 drop into both eyes daily as needed., Disp: , Rfl:    Ketotifen Fumarate POWD, 0.5 mg by Does not apply route. 6-12 times a day, Disp: , Rfl:    L-Lysine 500 MG CAPS, Take by mouth 3 (three) times daily., Disp: , Rfl:    L-Lysine HCl 500 MG CAPS, Take by mouth., Disp: , Rfl:    L-Theanine 100 MG CAPS, Take 200 mg by mouth as needed., Disp: , Rfl:    levocetirizine (XYZAL) 2.5 MG/5ML solution, as needed., Disp: , Rfl:    Light Mineral Oil-Mineral Oil 0.5-0.5 % EMUL, Apply 1 application to eye daily., Disp: , Rfl:    loperamide (IMODIUM) 2 MG capsule, Take by mouth as needed for diarrhea or loose stools., Disp: , Rfl:    Lutein-Zeaxanthin 25-5 MG CAPS, Take 1 capsule by mouth every other day., Disp: , Rfl:    Magnesium Citrate POWD, Take 100 mg by mouth daily., Disp: , Rfl:    Melatonin 1 MG/ML LIQD, Take by mouth daily., Disp: , Rfl:    montelukast (SINGULAIR) 5 MG chewable tablet, Chew 5 mg by mouth in the morning and at bedtime., Disp: , Rfl:    Non Gelatin Capsules, Empty, (CAPSULE #3 CLEAR/CLEAR VEG) CAPS, Take 1 capsule by mouth daily., Disp: , Rfl:    NONFORMULARY OR COMPOUNDED ITEM, Bupivacaine nasal 0.5% drops w/o epi, Disp: , Rfl:    omega-3 acid ethyl esters (LOVAZA) 1 g  capsule, TAKE 2 CAPSULES BY MOUTH 2 TIMES DAILY., Disp: 360 capsule, Rfl: 3   pseudoephedrine (SUDAFED) 30 MG tablet, Take 30 mg by mouth every 4 (four) hours as needed for congestion. , Disp: , Rfl:    pyridostigmine (MESTINON) 60 MG tablet, Take 15 mg by mouth. 3-4 times a day, Disp: , Rfl:    Quercetin 500 MG CAPS, Take by mouth as needed., Disp: , Rfl:    Riboflavin (VITAMIN B-2 PO), Take 100 mg by mouth 3 (three) times daily., Disp: ,  Rfl:    Roflumilast (ZORYVE) 0.3 % CREA, Apply to elbows once daily., Disp: 60 g, Rfl: 2   Selenium 200 MCG CAPS, Take 1 capsule by mouth. Twice a month, Disp: , Rfl:    simethicone (MYLICON) 80 MG chewable tablet, Chew 80 mg by mouth every 6 (six) hours as needed for flatulence., Disp: , Rfl:    Sodium Fluoride 1.1 % PSTE, Place onto teeth., Disp: , Rfl:    Suvorexant (BELSOMRA) 5 MG TABS, 1 tablet at bedtime as needed Orally Once a day, Disp: , Rfl:    trolamine salicylate (ASPERCREME/ALOE) 10 % cream, Apply topically., Disp: , Rfl:    Ubrogepant (UBRELVY) 50 MG TABS, Take 50 mg by mouth as needed (for migriane). May repeat a dose in 2 hours if headache persists, Disp: 16 tablet, Rfl: 6   Varenicline Tartrate (TYRVAYA) 0.03 MG/ACT SOLN, Place into the nose., Disp: , Rfl:    vitamin C (ASCORBIC ACID) 500 MG tablet, Take 500 mg by mouth daily., Disp: , Rfl:    Vitamin D, Cholecalciferol, 10 MCG (400 UNIT) TABS, Take by mouth daily. , Disp: , Rfl:    Family History  Problem Relation Age of Onset   Colon cancer Father 32   Breast cancer Maternal Aunt 36       BRCA neg   Endometrial cancer Maternal Aunt 50   Colon cancer Maternal Grandmother 84   Migraines Maternal Grandmother    Tremor Maternal Grandmother    Leukemia Maternal Grandfather 22   Food Allergy Maternal Grandfather    Migraines Mother    Glaucoma Mother    Cataracts Mother    Tremor Mother    Fibromyalgia Mother    Heart disease Mother    Colon polyps Brother        non-cancerous     Social History   Tobacco Use   Smoking status: Former    Types: Cigarettes   Smokeless tobacco: Never   Tobacco comments:    Quit 30 yrs ago- only sometimes in college  Vaping Use   Vaping Use: Never used  Substance Use Topics   Alcohol use: Never   Drug use: Never    Allergies as of 10/22/2022 - Review Complete 10/22/2022  Allergen Reaction Noted   Iodinated contrast media Hives, Itching, Palpitations, Photosensitivity, and  Shortness Of Breath 12/13/2020   Ioversol Anxiety, Hives, Itching, and Palpitations 12/05/2016   Ciprofloxacin Other (See Comments) 01/12/2018   Levofloxacin Other (See Comments) 01/12/2018   Other Other (See Comments) 12/17/2017   Soy allergy  12/17/2017   Soybean-containing drug products Other (See Comments) 12/17/2017   Bee pollen  08/11/2018   Bupropion  08/11/2018   Celebrex [celecoxib]  08/11/2018   Codeine  08/11/2018   Dexilant [dexlansoprazole]  08/11/2018   Doxepin  09/05/2022   Dulera [mometasone furo-formoterol fum]  08/11/2018   Fentanyl  08/11/2018   Fluticasone  08/11/2018   Gabapentin  08/11/2018   Gluten meal  08/11/2018   Histidine  09/26/2021   Lidocaine  08/11/2018   Lyrica [pregabalin]  08/11/2018   Mold extract [trichophyton]  08/11/2018   Morphine and related  08/11/2018   Penicillins  08/11/2018   Pollen extract  08/11/2018   Polysorbate [sorbitan]  09/26/2021   Prilosec [omeprazole]  08/11/2018   Rizatriptan Other (See Comments) 04/14/2019   Salmeterol xinafoate  02/27/2012   Silenor [doxepin hcl]  08/11/2018   Stevioside Other (See Comments) 03/15/2020   Tomato  08/11/2018   Xylitol Other (See Comments) 03/15/2020   Brewer's dried yeast [yeast] Other (See Comments) 06/11/2021   Ethanol Dermatitis, Other (See Comments), Itching, and Palpitations 05/31/2019   Hydrocodone Nausea Only 09/03/2018   Ibuprofen Nausea And Vomiting 02/27/2012   Lanolin Other (See Comments) 12/09/2017   Morphine Nausea Only 08/11/2018   Wheat bran Diarrhea 11/13/2014   Yeast-related products Other (See Comments) 06/11/2021    Review of Systems:    All systems reviewed and negative except where noted in HPI.   Physical Exam:  BP 138/81 (BP Location: Left Arm, Patient Position: Sitting, Cuff Size: Normal)   Pulse 82   Temp 98.1 F (36.7 C) (Oral)   Ht 5' 1.75" (1.568 m)   Wt 142 lb 8 oz (64.6 kg)   BMI 26.28 kg/m  No LMP recorded. Patient is  postmenopausal.  General:   Alert,  Well-developed, well-nourished, pleasant and cooperative in NAD Head:  Normocephalic and atraumatic. Eyes:  Sclera clear, no icterus.   Conjunctiva pink. Ears:  Normal auditory acuity. Nose:  No deformity, discharge, or lesions. Mouth:  No deformity or lesions,oropharynx pink & moist. Neck:  Supple; no masses or thyromegaly. Lungs:  Respirations even and unlabored.  Clear throughout to auscultation.   No wheezes, crackles, or rhonchi. No acute distress. Heart:  Regular rate and rhythm; no murmurs, clicks, rubs, or gallops. Abdomen:  Normal bowel sounds. Soft, non-tender and nondistended, faint midline vertical scar without masses, hepatosplenomegaly or hernias noted.  No guarding or rebound tenderness.   Rectal: Not performed Msk:  Symmetrical without gross deformities. Good, equal movement & strength bilaterally. Pulses:  Normal pulses noted. Extremities:  No clubbing or edema.  No cyanosis. Neurologic:  Alert and oriented x3;  grossly normal neurologically. Skin:  Intact without significant lesions or rashes. No jaundice. Psych:  Alert and cooperative. Normal mood and affect.  Imaging Studies: Reviewed  Assessment and Plan:   Autumn Zhang is a 52 y.o. Caucasian female with history of colon cancer in 2009 status post right hemicolectomy with primary anastomosis, neoadjuvant chemo, in remission, mast cell activation syndrome, mild pelvic floor dysfunction with mild rectocele, pelvic floor dysfunction, exocrine pancreatic insufficiency  Continue current dose of Creon for pancreatic insufficiency Discussed about Reglan as needed for flareup of gastroparesis symptoms.  History of colon cancer, history of tubular adenomas of colon: Recommend surveillance colonoscopy every 3 to 5 years  Spent majority of the time answering her questions regarding long-term management of HPI  Follow up as needed   Autumn Darby, MD

## 2022-10-22 NOTE — Progress Notes (Unsigned)
Virtual Visit via Video Note  I connected with Autumn Zhang on 10/23/22 at  9:00 AM EST by a video enabled telemedicine application and verified that I am speaking with the correct person using two identifiers.  Location: Patient: home Provider: office   I discussed the limitations of evaluation and management by telemedicine and the availability of in person appointments. The patient expressed understanding and agreed to proceed.  CC:  headaches  Follow-up Visit  Last visit: 05/06/22  Brief HPI: 52 year old female with a history of POTS, IBS, Mast cell activation syndrome, TMJ, Ehler's danlos syndrome who follows in clinic for migraines and facial pain.   At her last visit she was continued on Qulipta 10 mg daily and Ubrelvy 50 mg PRN for migraine prevention.  Interval History: She was unable to pick up her [name] due to increase in cost once the new year started. She was cutting it in half for the past 2 months and now is her first day without the medication at all. She has noticed a worsening in her headaches. Still has not any severe migraine headaches but has noticed more moderate headache days.  She has a lot of stress in her life as someone in her life is about to go to prison. She was recently started on Buspar for anxiety but this has been causing side effects. She is considering starting a beta blocker and plans to speak with her Cardiologist about it.   Current Headache Regimen: Preventative: Qulipta 10 mg daily Abortive: Ubrelvy 50 mg PRN   Prior Therapies                                  Prevention: Botox Emgality Qulipta - helped but unable to afford Amitriptyline Lyrica Gabapentin Vimpat  RescueL Maxalt Nurtec Ubrelvy Ketamine nasal spray Liquid lidocaine SPG block Occipital nerve block  Physical Exam:   GENERAL:  well appearing, in no acute distress, alert  HEAD:  Normocephalic/atraumatic.  NEUROLOGICAL: Mental Status: Alert, oriented to  person, place and time, Follows commands, and Speech fluent and appropriate. Cranial Nerves: face symmetric, no dysarthria, hearing grossly intact Motor: moves all extremities equally  IMPRESSION: 52 year old female with a history of POTS, IBS, Mast cell activation syndrome, TMJ, Ehler's danlos syndrome who presents for follow up of migraines and facial pain. She had been doing relatively well until she was unable to afford [place] due to insurance changes. She does have some leftover Nurtec at home. Advised her to start taking this every other day to see if she tolerates it. If she tolerates it well will plan to start Nurtec every other day for prevention. Counseled to avoid taking Ubrelvy with Nurtec. Agree that a beta blocker may be useful for both her anxiety and migraines. She will speak to her Cardiologist about starting this.   PLAN: -Prevention: She will try Nurtec every other day with her remaining pills. Will send in rx if this is effective -Rescue: Nurtec as needed. Counseled to avoid taking Ubrelvy and Nurtec together -Next steps: consider beta blocker, Topamax for prevention  Follow-up: 6 months  I spent a total of 20 minutes on the date of the service. Headache education was done. Discussed treatment options including preventive and acute medications. Discussed medication side effects, adverse reactions and drug interactions. Written educational materials and patient instructions outlining all of the above were given.  Genia Harold, MD 10/23/22 9:17  AM

## 2022-10-23 ENCOUNTER — Telehealth (INDEPENDENT_AMBULATORY_CARE_PROVIDER_SITE_OTHER): Payer: Medicare Other | Admitting: Psychiatry

## 2022-10-23 DIAGNOSIS — G43119 Migraine with aura, intractable, without status migrainosus: Secondary | ICD-10-CM | POA: Diagnosis not present

## 2022-10-24 ENCOUNTER — Telehealth: Payer: Self-pay | Admitting: Family Medicine

## 2022-10-24 NOTE — Telephone Encounter (Signed)
Received fax from covermymeds for restasis 0.05%   Key: [facility]

## 2022-10-26 ENCOUNTER — Other Ambulatory Visit: Payer: Self-pay | Admitting: Family Medicine

## 2022-10-27 NOTE — Telephone Encounter (Signed)
PA not needed. Patient reports she does not use medication anymore.

## 2022-11-11 ENCOUNTER — Other Ambulatory Visit: Payer: Self-pay | Admitting: Family Medicine

## 2022-11-18 ENCOUNTER — Encounter: Payer: Self-pay | Admitting: Family Medicine

## 2022-11-18 ENCOUNTER — Telehealth (INDEPENDENT_AMBULATORY_CARE_PROVIDER_SITE_OTHER): Payer: Medicare Other | Admitting: Family Medicine

## 2022-11-18 VITALS — BP 96/72

## 2022-11-18 DIAGNOSIS — F411 Generalized anxiety disorder: Secondary | ICD-10-CM | POA: Diagnosis not present

## 2022-11-18 NOTE — Progress Notes (Signed)
MyChart Video Visit    Virtual Visit via Video Note   This format is felt to be most appropriate for this patient at this time. Physical exam was limited by quality of the video and audio technology used for the visit.    Patient location: home Provider location: [place] involved in the visit: patient, provider   I discussed the limitations of evaluation and management by telemedicine and the availability of in person appointments. The patient expressed understanding and agreed to proceed.  Patient: Autumn Zhang   DOB: 1971-07-16   52 y.o. Female  MRN: [id number] Visit Date: 11/18/2022  Today's healthcare provider: [name], MD   Chief Complaint  Patient presents with   Anxiety   Subjective    HPI  Anxiety, Follow-up  She was last seen for anxiety 4 weeks ago. Changes made at last visit include start buspar 5 mg. Patient has restarted Qulipta 10 mg daily.    She reports  side effects. Will discuss with provider   stopped buspar on Nov 01, 2022.  She reports poor tolerance of treatment. For the few first days worked really well, then got SEs - jitteriness, palpitations, chest pain but wihtout the anxiety that usually comes with them. She is having side effects.   Anxiety is gone since going back on qulipta   GAD-7 Results    11/18/2022   10:33 AM 10/20/2022   10:37 AM  GAD-7 Generalized Anxiety Disorder Screening Tool  1. Feeling Nervous, Anxious, or on Edge 1 3  2. Not Being Able to Stop or Control Worrying 0 3  3. Worrying Too Much About Different Things 0 3  4. Trouble Relaxing 0 3  5. Being So Restless it's Hard To Sit Still 0 3  6. Becoming Easily Annoyed or Irritable 1 2  7. Feeling Afraid As If Something Awful Might Happen 0 3  Total GAD-7 Score 2 20  Difficulty At Work, Home, or Getting  Along With Others? Somewhat difficult Somewhat difficult    PHQ-9 Scores    11/18/2022   10:32 AM 10/20/2022   10:45 AM 06/19/2022     9:01 AM  PHQ9 SCORE ONLY  PHQ-9 Total Score '7 11 11    '$ ---------------------------------------------------------------------------------------------------    Medications: Outpatient Medications Prior to Visit  Medication Sig   acetaminophen (TYLENOL) 500 MG tablet Take 500 mg by mouth every 6 (six) hours as needed.   albuterol (PROVENTIL HFA;VENTOLIN HFA) 108 (90 Base) MCG/ACT inhaler Inhale 2 puffs into the lungs every 4 (four) hours as needed.    aspirin 325 MG tablet Take 325 mg by mouth.   Atogepant (QULIPTA) 10 MG TABS Take 10 mg by mouth daily.   azelastine (ASTELIN) 0.1 % nasal spray Place into both nostrils as needed for rhinitis. Use in each nostril as directed   Blood Pressure Monitoring (BLOOD PRESSURE CUFF) MISC Take blood pressure as needed for symptoms.   Boswellia Serrata (BOSWELLIA PO) Take 150 mg by mouth every morning.    Calcium Citrate 200 MG TABS Take 600 mg by mouth.   cromolyn (GASTROCROM) 100 MG/5ML solution Take by mouth 4 (four) times daily -  before meals and at bedtime.   Cyanocobalamin (VITAMIN B-12) 1000 MCG SUBL Place under the tongue.   desloratadine (CLARINEX) 5 MG tablet Take 5 mg by mouth as needed.   dexamethasone (DECADRON) 1 MG tablet Take 1 mg by mouth at bedtime.   Diclofenac Sodium 1.5 % SOLN 40 drops  Externally Four times a day   diphenhydrAMINE (BENADRYL) 12.5 MG/5ML liquid Take by mouth 4 (four) times daily as needed.   diphenhydrAMINE-Allantoin 2-0.5 % CREA Apply 1 application topically as needed.    docusate sodium (COLACE) 100 MG capsule Take 100 mg by mouth every other day.    EPINEPHrine 0.3 mg/0.3 mL IJ SOAJ injection INJECT ONE AUTO-INJECTOR INTRAMUSCULARLY AS NEEDED   estradiol (ESTRACE) 0.1 MG/GM vaginal cream INSERT 1 GRAM VAGINALLY TWICE WEEKLY   famotidine (PEPCID) 10 MG tablet Take 10 mg by mouth daily.    fexofenadine (ALLEGRA) 180 MG tablet Take 180 mg by mouth 2 (two) times daily.   guaifenesin (HUMIBID E) 400 MG TABS  tablet Take 400 mg by mouth as needed.   hydrOXYzine (ATARAX) 25 MG tablet Take 25 mg by mouth as needed.   IODINE EX Apply 225 mcg topically. Every 3 days   KETAMINE HCL SL Place into right nostril as needed.   ketotifen (ZADITOR) 0.025 % ophthalmic solution Place 1 drop into both eyes daily as needed.   Ketotifen Fumarate POWD 0.5 mg by Does not apply route. 6-12 times a day   L-Lysine 500 MG CAPS Take by mouth 3 (three) times daily.   L-Lysine HCl 500 MG CAPS Take by mouth.   L-Theanine 100 MG CAPS Take 200 mg by mouth as needed.   levocetirizine (XYZAL) 2.5 MG/5ML solution as needed.   Light Mineral Oil-Mineral Oil 0.5-0.5 % EMUL Apply 1 application to eye daily.   lipase/protease/amylase (CREON) 12000-38000 units CPEP capsule TAKE 1-3 CAPSULES (12,000-36,000 UNITS TOTAL) BY MOUTH 3 (THREE) TIMES DAILY BEFORE MEALS.   loperamide (IMODIUM) 2 MG capsule Take by mouth as needed for diarrhea or loose stools.   Lutein-Zeaxanthin 25-5 MG CAPS Take 1 capsule by mouth every other day.   Magnesium Citrate POWD Take 100 mg by mouth daily.   Melatonin 1 MG/ML LIQD Take by mouth daily.   montelukast (SINGULAIR) 5 MG chewable tablet Chew 5 mg by mouth in the morning and at bedtime.   Non Gelatin Capsules, Empty, (CAPSULE #3 CLEAR/CLEAR VEG) CAPS Take 1 capsule by mouth daily.   NONFORMULARY OR COMPOUNDED ITEM Bupivacaine nasal 0.5% drops w/o epi   omega-3 acid ethyl esters (LOVAZA) 1 g capsule TAKE 2 CAPSULES BY MOUTH 2 TIMES DAILY.   pseudoephedrine (SUDAFED) 30 MG tablet Take 30 mg by mouth every 4 (four) hours as needed for congestion.    pyridostigmine (MESTINON) 60 MG tablet Take 15 mg by mouth. 3-4 times a day   Quercetin 500 MG CAPS Take by mouth as needed.   Riboflavin (VITAMIN B-2 PO) Take 100 mg by mouth 3 (three) times daily.   Roflumilast (ZORYVE) 0.3 % CREA Apply to elbows once daily.   Selenium 200 MCG CAPS Take 1 capsule by mouth. Twice a month   simethicone (MYLICON) 80 MG chewable  tablet Chew 80 mg by mouth every 6 (six) hours as needed for flatulence.   Sodium Fluoride 1.1 % PSTE Place onto teeth.   Suvorexant (BELSOMRA) 5 MG TABS 1 tablet at bedtime as needed Orally Once a day   trolamine salicylate (ASPERCREME/ALOE) 10 % cream Apply topically.   Ubrogepant (UBRELVY) 50 MG TABS Take 50 mg by mouth as needed (for migriane). May repeat a dose in 2 hours if headache persists   vitamin C (ASCORBIC ACID) 500 MG tablet Take 500 mg by mouth daily.   Vitamin D, Cholecalciferol, 10 MCG (400 UNIT) TABS Take by mouth daily.  bupivacaine (MARCAINE) 0.5 % injection as directed Injection   busPIRone (BUSPAR) 5 MG tablet TAKE 1 TABLET BY MOUTH TWICE A DAY   Varenicline Tartrate (TYRVAYA) 0.03 MG/ACT SOLN Place into the nose.   No facility-administered medications prior to visit.    Review of Systems     Objective    BP 96/72      Physical Exam Constitutional:      General: She is not in acute distress.    Appearance: Normal appearance.  HENT:     Head: Normocephalic.  Pulmonary:     Effort: Pulmonary effort is normal. No respiratory distress.  Neurological:     Mental Status: She is alert and oriented to person, place, and time. Mental status is at baseline.        Assessment & Plan     Problem List Items Addressed This Visit       Other   GAD (generalized anxiety disorder) - Primary    Chronic and stable Buspar gave increase in POTs symptoms, despite helping anxiety Small literature that it may have norepi-effect Well controlled currently, but consider in the future a beta blocker (natolol) possibly Also taking choline supplement        No follow-ups on file.     I discussed the assessment and treatment plan with the patient. The patient was provided an opportunity to ask questions and all were answered. The patient agreed with the plan and demonstrated an understanding of the instructions.   The patient was advised to call back or seek an  in-person evaluation if the symptoms worsen or if the condition fails to improve as anticipated.  I, [name], MD, have reviewed all documentation for this visit. The documentation on 11/18/22 for the exam, diagnosis, procedures, and orders are all accurate and complete.   Nivaan Dicenzo, Dionne Bucy, MD, MPH [facility]

## 2022-11-18 NOTE — Assessment & Plan Note (Signed)
Chronic and stable Buspar gave increase in POTs symptoms, despite helping anxiety Small literature that it may have norepi-effect Well controlled currently, but consider in the future a beta blocker (natolol) possibly Also taking choline supplement

## 2022-12-01 ENCOUNTER — Encounter: Payer: Self-pay | Admitting: Family Medicine

## 2022-12-03 ENCOUNTER — Other Ambulatory Visit: Payer: Self-pay | Admitting: Family Medicine

## 2022-12-03 MED ORDER — PANCRELIPASE (LIP-PROT-AMYL) 12000-38000 UNITS PO CPEP: ORAL_CAPSULE | ORAL | 0 refills | Status: DC

## 2022-12-05 ENCOUNTER — Encounter: Payer: Self-pay | Admitting: Oncology

## 2022-12-05 ENCOUNTER — Telehealth: Payer: Self-pay | Admitting: *Deleted

## 2022-12-05 NOTE — Telephone Encounter (Signed)
I called the pt. And wanted to know if she would like IV iron and she says that she is started back with restless syndrome . She asked if she can have something  else and not use benadryl. She does not want steroids at all. I just seen a message from her about the meds. I sent it to [facility]

## 2022-12-06 NOTE — Telephone Encounter (Signed)
Pt wants cetrizine IV for pre med instead of benadryl for iv iron. Is that on our formulary?

## 2022-12-09 ENCOUNTER — Encounter: Payer: Self-pay | Admitting: *Deleted

## 2022-12-11 ENCOUNTER — Encounter: Payer: Self-pay | Admitting: *Deleted

## 2022-12-12 ENCOUNTER — Telehealth: Payer: Self-pay | Admitting: *Deleted

## 2022-12-12 NOTE — Telephone Encounter (Signed)
I have been sending messages to several staff members to get this pt set up. She has mast cell disease. She gets  Allergies when taking medications. She asked for IV cetrizine and I reach out to [place] the pharmacist and she can order it and will com in nextwed. Then I checked with donna to make sure her insurance will give her ferrlecit and she sent me a secure chat that it does not need auth. Pt has been set up to get the infusion on 4/8. Pt aware

## 2022-12-19 ENCOUNTER — Ambulatory Visit: Payer: Medicare Other | Admitting: Family Medicine

## 2022-12-20 ENCOUNTER — Other Ambulatory Visit: Payer: Self-pay | Admitting: Psychiatry

## 2022-12-20 ENCOUNTER — Encounter: Payer: Self-pay | Admitting: Psychiatry

## 2022-12-22 ENCOUNTER — Other Ambulatory Visit: Payer: Self-pay | Admitting: Oncology

## 2022-12-22 ENCOUNTER — Inpatient Hospital Stay: Payer: Medicare Other | Attending: Oncology

## 2022-12-22 VITALS — BP 109/75 | HR 72 | Temp 96.8°F | Resp 18

## 2022-12-22 DIAGNOSIS — Z862 Personal history of diseases of the blood and blood-forming organs and certain disorders involving the immune mechanism: Secondary | ICD-10-CM

## 2022-12-22 DIAGNOSIS — D509 Iron deficiency anemia, unspecified: Secondary | ICD-10-CM | POA: Diagnosis not present

## 2022-12-22 MED ORDER — SODIUM CHLORIDE 0.9 % IV SOLN
125.0000 mg | INTRAVENOUS | Status: DC
  Administered 2022-12-22: 125 mg via INTRAVENOUS
  Filled 2022-12-22: qty 10

## 2022-12-22 MED ORDER — CETIRIZINE HCL 10 MG/ML IV SOLN
10.0000 mg | INTRAVENOUS | Status: DC
  Administered 2022-12-22: 10 mg via INTRAVENOUS
  Filled 2022-12-22: qty 1

## 2022-12-22 MED ORDER — DIPHENHYDRAMINE HCL 50 MG/ML IJ SOLN: 50.0000 mg | Freq: Once | INTRAMUSCULAR | Status: DC

## 2022-12-22 MED ORDER — ACETAMINOPHEN 325 MG PO TABS: 650.0000 mg | ORAL_TABLET | Freq: Once | ORAL | Status: DC

## 2022-12-22 MED ORDER — QULIPTA 10 MG PO TABS: 1.0000 | ORAL_TABLET | Freq: Every day | ORAL | 6 refills | Status: DC

## 2022-12-22 MED ORDER — SODIUM CHLORIDE 0.9 % IV SOLN
Freq: Once | INTRAVENOUS | Status: AC
  Filled 2022-12-22: qty 250

## 2022-12-22 MED ORDER — METHYLPREDNISOLONE SODIUM SUCC 125 MG IJ SOLR: 40.0000 mg | Freq: Once | INTRAMUSCULAR | Status: DC

## 2022-12-22 NOTE — Telephone Encounter (Signed)
Last seen on 10/23/22 Video visit scheduled on 05/28/23 Last filled 11/25/22 #30 tablets

## 2022-12-22 NOTE — Patient Instructions (Signed)
Sodium Ferric Gluconate Complex Injection What is this medication? SODIUM FERRIC GLUCONATE COMPLEX (SOE dee um FER ik GLOO koe nate KOM pleks) treats low levels of iron (iron deficiency anemia) in people with kidney disease. Iron is a mineral that plays an important role in making red blood cells, which carry oxygen from your lungs to the rest of your body. This medicine may be used for other purposes; ask your health care provider or pharmacist if you have questions. COMMON BRAND NAME(S): Ferrlecit, Nulecit What should I tell my care team before I take this medication? They need to know if you have any of the following conditions: Anemia that is not from iron deficiency High levels of iron in the blood An unusual or allergic reaction to iron, other medications, foods, dyes, or preservatives Pregnant or are trying to become pregnant Breast-feeding How should I use this medication? This medication is injected into a vein. It is given by your care team in a hospital or clinic setting. Talk to your care team about the use of this medication in children. While it may be prescribed for children as young as 6 years for selected conditions, precautions do apply. Overdosage: If you think you have taken too much of this medicine contact a poison control center or emergency room at once. NOTE: This medicine is only for you. Do not share this medicine with others. What if I miss a dose? It is important not to miss your dose. Call your care team if you are unable to keep an appointment. What may interact with this medication? Do not take this medication with any of the following: Deferasirox Deferoxamine Dimercaprol This medication may also interact with the following: Other iron products This list may not describe all possible interactions. Give your health care provider a list of all the medicines, herbs, non-prescription drugs, or dietary supplements you use. Also tell them if you smoke, drink  alcohol, or use illegal drugs. Some items may interact with your medicine. What should I watch for while using this medication? Your condition will be monitored carefully while you are receiving this medication. Visit your care team for regular checks on your progress. You may need blood work while you are taking this medication. What side effects may I notice from receiving this medication? Side effects that you should report to your care team as soon as possible: Allergic reactions--skin rash, itching, hives, swelling of the face, lips, tongue, or throat Low blood pressure--dizziness, feeling faint or lightheaded, blurry vision Shortness of breath Side effects that usually do not require medical attention (report to your care team if they continue or are bothersome): Flushing Headache Joint pain Muscle pain Nausea Pain, redness, or irritation at injection site This list may not describe all possible side effects. Call your doctor for medical advice about side effects. You may report side effects to FDA at 1-800-FDA-1088. Where should I keep my medication? This medication is given in a hospital or clinic and will not be stored at home. NOTE: This sheet is a summary. It may not cover all possible information. If you have questions about this medicine, talk to your doctor, pharmacist, or health care provider.  2023 Elsevier/Gold Standard (2021-01-25 00:00:00)  

## 2022-12-22 NOTE — Telephone Encounter (Signed)
I went ahead and sent a qulipta refill in for her, thanks

## 2022-12-25 ENCOUNTER — Encounter: Payer: Self-pay | Admitting: *Deleted

## 2023-01-05 ENCOUNTER — Inpatient Hospital Stay: Payer: Medicare Other

## 2023-01-05 VITALS — BP 121/69 | HR 77 | Temp 96.3°F | Resp 18

## 2023-01-05 DIAGNOSIS — D509 Iron deficiency anemia, unspecified: Secondary | ICD-10-CM | POA: Diagnosis not present

## 2023-01-05 DIAGNOSIS — Z862 Personal history of diseases of the blood and blood-forming organs and certain disorders involving the immune mechanism: Secondary | ICD-10-CM

## 2023-01-05 MED ORDER — SODIUM CHLORIDE 0.9 % IV SOLN
125.0000 mg | INTRAVENOUS | Status: DC
  Administered 2023-01-05: 125 mg via INTRAVENOUS
  Filled 2023-01-05: qty 10

## 2023-01-05 MED ORDER — CETIRIZINE HCL 10 MG/ML IV SOLN
10.0000 mg | INTRAVENOUS | Status: DC
  Administered 2023-01-05: 10 mg via INTRAVENOUS
  Filled 2023-01-05: qty 1

## 2023-01-05 MED ORDER — SODIUM CHLORIDE 0.9 % IV SOLN
INTRAVENOUS | Status: DC
  Filled 2023-01-05: qty 250

## 2023-01-05 NOTE — Patient Instructions (Signed)
Sodium Ferric Gluconate Complex Injection What is this medication? SODIUM FERRIC GLUCONATE COMPLEX (SOE dee um FER ik GLOO koe nate KOM pleks) treats low levels of iron (iron deficiency anemia) in people with kidney disease. Iron is a mineral that plays an important role in making red blood cells, which carry oxygen from your lungs to the rest of your body. This medicine may be used for other purposes; ask your health care provider or pharmacist if you have questions. COMMON BRAND NAME(S): Ferrlecit, Nulecit What should I tell my care team before I take this medication? They need to know if you have any of the following conditions: Anemia that is not from iron deficiency High levels of iron in the blood An unusual or allergic reaction to iron, other medications, foods, dyes, or preservatives Pregnant or are trying to become pregnant Breast-feeding How should I use this medication? This medication is injected into a vein. It is given by your care team in a hospital or clinic setting. Talk to your care team about the use of this medication in children. While it may be prescribed for children as young as 6 years for selected conditions, precautions do apply. Overdosage: If you think you have taken too much of this medicine contact a poison control center or emergency room at once. NOTE: This medicine is only for you. Do not share this medicine with others. What if I miss a dose? It is important not to miss your dose. Call your care team if you are unable to keep an appointment. What may interact with this medication? Do not take this medication with any of the following: Deferasirox Deferoxamine Dimercaprol This medication may also interact with the following: Other iron products This list may not describe all possible interactions. Give your health care provider a list of all the medicines, herbs, non-prescription drugs, or dietary supplements you use. Also tell them if you smoke, drink  alcohol, or use illegal drugs. Some items may interact with your medicine. What should I watch for while using this medication? Your condition will be monitored carefully while you are receiving this medication. Visit your care team for regular checks on your progress. You may need blood work while you are taking this medication. What side effects may I notice from receiving this medication? Side effects that you should report to your care team as soon as possible: Allergic reactions--skin rash, itching, hives, swelling of the face, lips, tongue, or throat Low blood pressure--dizziness, feeling faint or lightheaded, blurry vision Shortness of breath Side effects that usually do not require medical attention (report to your care team if they continue or are bothersome): Flushing Headache Joint pain Muscle pain Nausea Pain, redness, or irritation at injection site This list may not describe all possible side effects. Call your doctor for medical advice about side effects. You may report side effects to FDA at 1-800-FDA-1088. Where should I keep my medication? This medication is given in a hospital or clinic and will not be stored at home. NOTE: This sheet is a summary. It may not cover all possible information. If you have questions about this medicine, talk to your doctor, pharmacist, or health care provider.  2023 Elsevier/Gold Standard (2021-01-25 00:00:00)  

## 2023-01-10 ENCOUNTER — Other Ambulatory Visit: Payer: Self-pay | Admitting: Family Medicine

## 2023-01-10 ENCOUNTER — Encounter: Payer: Self-pay | Admitting: Family Medicine

## 2023-01-12 MED ORDER — PANCRELIPASE (LIP-PROT-AMYL) 12000-38000 UNITS PO CPEP: ORAL_CAPSULE | ORAL | 0 refills | Status: DC

## 2023-01-13 ENCOUNTER — Encounter: Payer: Self-pay | Admitting: Psychiatry

## 2023-01-13 ENCOUNTER — Telehealth: Payer: Self-pay | Admitting: *Deleted

## 2023-01-13 ENCOUNTER — Ambulatory Visit (INDEPENDENT_AMBULATORY_CARE_PROVIDER_SITE_OTHER): Payer: Medicare Other | Admitting: Dermatology

## 2023-01-13 ENCOUNTER — Encounter: Payer: Self-pay | Admitting: Dermatology

## 2023-01-13 VITALS — BP 107/68 | HR 71

## 2023-01-13 DIAGNOSIS — Z85828 Personal history of other malignant neoplasm of skin: Secondary | ICD-10-CM

## 2023-01-13 DIAGNOSIS — D485 Neoplasm of uncertain behavior of skin: Secondary | ICD-10-CM

## 2023-01-13 DIAGNOSIS — D2271 Melanocytic nevi of right lower limb, including hip: Secondary | ICD-10-CM

## 2023-01-13 DIAGNOSIS — L578 Other skin changes due to chronic exposure to nonionizing radiation: Secondary | ICD-10-CM

## 2023-01-13 DIAGNOSIS — D2272 Melanocytic nevi of left lower limb, including hip: Secondary | ICD-10-CM

## 2023-01-13 DIAGNOSIS — L814 Other melanin hyperpigmentation: Secondary | ICD-10-CM

## 2023-01-13 DIAGNOSIS — Z1283 Encounter for screening for malignant neoplasm of skin: Secondary | ICD-10-CM

## 2023-01-13 DIAGNOSIS — X32XXXA Exposure to sunlight, initial encounter: Secondary | ICD-10-CM

## 2023-01-13 DIAGNOSIS — D229 Melanocytic nevi, unspecified: Secondary | ICD-10-CM

## 2023-01-13 DIAGNOSIS — L409 Psoriasis, unspecified: Secondary | ICD-10-CM

## 2023-01-13 DIAGNOSIS — D2239 Melanocytic nevi of other parts of face: Secondary | ICD-10-CM

## 2023-01-13 DIAGNOSIS — W908XXA Exposure to other nonionizing radiation, initial encounter: Secondary | ICD-10-CM

## 2023-01-13 DIAGNOSIS — L821 Other seborrheic keratosis: Secondary | ICD-10-CM

## 2023-01-13 NOTE — Telephone Encounter (Signed)
Pt sent mychart stating PA for Autumn Zhang will expire in 60 days. She uploaded new insurance cards to media. Can you please help with this? Thank you

## 2023-01-13 NOTE — Progress Notes (Signed)
Follow-Up Visit   Subjective  Autumn Zhang is a 52 y.o. female who presents for the following: Skin Cancer Screening and Full Body Skin Exam  The patient presents for Total-Body Skin Exam (TBSE) for skin cancer screening and mole check. The patient has spots, moles and lesions to be evaluated, some may be new or changing. She has itching and scaling of the ears, and has tried Zoryve Cream. Helps some, but not clear. She has psoriasis of the elbows, controlled with Zoryve Cream. History of BCC.  Wants to avoid using topical steroids due to risk of absorption/high cortisol condition.   The following portions of the chart were reviewed this encounter and updated as appropriate: medications, allergies, medical history  Review of Systems:  No other skin or systemic complaints except as noted in HPI or Assessment and Plan.  Objective  Well appearing patient in no apparent distress; mood and affect are within normal limits.  A full examination was performed including scalp, head, eyes, ears, nose, lips, neck, chest, axillae, abdomen, back, buttocks, bilateral upper extremities, bilateral lower extremities, hands, feet, fingers, toes, fingernails, and toenails. All findings within normal limits unless otherwise noted below.   Relevant physical exam findings are noted in the Assessment and Plan.  Left lower calf 4 mm medium dark speckled macule; slightly larger and darker from previous exam and photo         Assessment & Plan   LENTIGINES, SEBORRHEIC KERATOSES, HEMANGIOMAS - Benign normal skin lesions - Benign-appearing - Call for any changes  MELANOCYTIC NEVI - Tan-brown and/or pink-flesh-colored symmetric macules and papules - Left Lower Calf; bilateral feet  multiple brown macules of the bilateral feet. Photos compared from 12/31/2021 and 07/08/2022 no changes - 0.5 cm brown papule R 4th dorsal toe  - Benign appearing on exam today - Observation - Call clinic for new or  changing moles - Recommend daily use of broad spectrum spf 30+ sunscreen to sun-exposed areas.   ACTINIC DAMAGE - Chronic condition, secondary to cumulative UV/sun exposure - diffuse scaly erythematous macules with underlying dyspigmentation - Recommend daily broad spectrum sunscreen SPF 30+ to sun-exposed areas, reapply every 2 hours as needed.  - Staying in the shade or wearing long sleeves, sun glasses (UVA+UVB protection) and wide brim hats (4-inch brim around the entire circumference of the hat) are also recommended for sun protection.  - Call for new or changing lesions.  SKIN CANCER SCREENING PERFORMED TODAY.   Fibrous Papule Exam: 1 mm indistinct pink flesh macule at mid nasal tip without features suspicious for malignancy on dermoscopy Treatment Plan:  Benign-appearing.  Observation.  Call clinic for new or changing lesions.   Neoplasm of uncertain behavior of skin Left lower calf  Epidermal / dermal shaving  Lesion diameter (cm):  0.6 Informed consent: discussed and consent obtained   Patient was prepped and draped in usual sterile fashion: Area prepped with alcohol. Anesthesia: the lesion was anesthetized in a standard fashion   Local anesthetic: 0.5% bupivacaine. Instrument used: flexible razor blade   Hemostasis achieved with: pressure, aluminum chloride and electrodesiccation   Outcome: patient tolerated procedure well   Post-procedure details: wound care instructions given   Post-procedure details comment:  Ointment and small bandage applied  Specimen 1 - Surgical pathology Differential Diagnosis: Nevus r/o Dysplasia Check Margins: Yes  PSORIASIS Exam: Mild erythema and scale of the bilateral elbows; scaling of the ear canals and scalp.   Chronic and persistent condition with duration or expected duration over  one year. Condition is bothersome/symptomatic for patient. Not at goal. Currently flared in the ears,  elbows improving.  Psoriasis is a chronic  non-curable, but treatable genetic/hereditary disease that may have other systemic features affecting other organ systems such as joints (Psoriatic Arthritis). It is associated with an increased risk of inflammatory bowel disease, heart disease, non-alcoholic fatty liver disease, and depression.  Treatments include light and laser treatments; topical medications; and systemic medications including oral and injectables.  Treatment Plan: Continue Zoryve cream once a day to affected areas. Vtama Cream - samples x 2 given to apply in ears once a day. Patient will call for Rx if improves. Lot [id number] Exp Dec 2024. Pt not able to use steroid cream due to high cortisol condition.   HISTORY OF BASAL CELL CARCINOMA OF THE SKIN - Well healed scar of the right calf with no evidence of recurrence. With adjacent brown macule c/w nevus, photo compared, no change - No evidence of recurrence today of the left chest, right forearm - Recommend regular full body skin exams - Recommend daily broad spectrum sunscreen SPF 30+ to sun-exposed areas, reapply every 2 hours as needed.  - Call if any new or changing lesions are noted between office visits   Return in about 1 year (around 01/13/2024) for TBSE, Hx BCC.  ICherlyn Labella, CMA, am acting as scribe for Willeen Niece, MD .   Documentation: I have reviewed the above documentation for accuracy and completeness, and I agree with the above.  Willeen Niece, MD

## 2023-01-13 NOTE — Patient Instructions (Addendum)
Start Vtama Cream - apply in ears once a day as needed.   Wound Care Instructions  Cleanse wound gently with soap and water once a day then pat dry with clean gauze. Apply a thin coat of Petrolatum (petroleum jelly, "Vaseline") over the wound (unless you have an allergy to this). We recommend that you use a new, sterile tube of Vaseline. Do not pick or remove scabs. Do not remove the yellow or white "healing tissue" from the base of the wound.  Cover the wound with fresh, clean, nonstick gauze and secure with paper tape. You may use Band-Aids in place of gauze and tape if the wound is small enough, but would recommend trimming much of the tape off as there is often too much. Sometimes Band-Aids can irritate the skin.  You should call the office for your biopsy report after 1 week if you have not already been contacted.  If you experience any problems, such as abnormal amounts of bleeding, swelling, significant bruising, significant pain, or evidence of infection, please call the office immediately.  FOR ADULT SURGERY PATIENTS: If you need something for pain relief you may take 1 extra strength Tylenol (acetaminophen) AND 2 Ibuprofen (200mg  each) together every 4 hours as needed for pain. (do not take these if you are allergic to them or if you have a reason you should not take them.) Typically, you may only need pain medication for 1 to 3 days.     Melanoma ABCDEs  Melanoma is the most dangerous type of skin cancer, and is the leading cause of death from skin disease.  You are more likely to develop melanoma if you: Have light-colored skin, light-colored eyes, or red or blond hair Spend a lot of time in the sun Tan regularly, either outdoors or in a tanning bed Have had blistering sunburns, especially during childhood Have a close family member who has had a melanoma Have atypical moles or large birthmarks  Early detection of melanoma is key since treatment is typically straightforward and  cure rates are extremely high if we catch it early.   The first sign of melanoma is often a change in a mole or a new dark spot.  The ABCDE system is a way of remembering the signs of melanoma.  A for asymmetry:  The two halves do not match. B for border:  The edges of the growth are irregular. C for color:  A mixture of colors are present instead of an even brown color. D for diameter:  Melanomas are usually (but not always) greater than 6mm - the size of a pencil eraser. E for evolution:  The spot keeps changing in size, shape, and color.  Please check your skin once per month between visits. You can use a small mirror in front and a large mirror behind you to keep an eye on the back side or your body.   If you see any new or changing lesions before your next follow-up, please call to schedule a visit.  Please continue daily skin protection including broad spectrum sunscreen SPF 30+ to sun-exposed areas, reapplying every 2 hours as needed when you're outdoors.   Staying in the shade or wearing long sleeves, sun glasses (UVA+UVB protection) and wide brim hats (4-inch brim around the entire circumference of the hat) are also recommended for sun protection.     Due to recent changes in healthcare laws, you may see results of your pathology and/or laboratory studies on MyChart before the doctors have  had a chance to review them. We understand that in some cases there may be results that are confusing or concerning to you. Please understand that not all results are received at the same time and often the doctors may need to interpret multiple results in order to provide you with the best plan of care or course of treatment. Therefore, we ask that you please give [place] 2 business days to thoroughly review all your results before contacting the office for clarification. Should we see a critical lab result, you will be contacted sooner.   If You Need Anything After Your Visit  If you have any  questions or concerns for your doctor, please call our main line at [phone] and press option 4 to reach your doctor's medical assistant. If no one answers, please leave a voicemail as directed and we will return your call as soon as possible. Messages left after 4 pm will be answered the following business day.   You may also send [place] a message via [place]. We typically respond to MyChart messages within 1-2 business days.  For prescription refills, please ask your pharmacy to contact our office. Our fax number is [phone].  If you have an urgent issue when the clinic is closed that cannot wait until the next business day, you can page your doctor at the number below.    Please note that while we do our best to be available for urgent issues outside of office hours, we are not available 24/7.   If you have an urgent issue and are unable to reach [place], you may choose to seek medical care at your doctor's office, retail clinic, urgent care center, or emergency room.  If you have a medical emergency, please immediately call 911 or go to the emergency department.  Pager Numbers  - Dr. Nehemiah Massed: [phone]  - Dr. Laurence Ferrari: [phone]  - Dr. Nicole Kindred: [phone]  In the event of inclement weather, please call our main line at [phone] for an update on the status of any delays or closures.  Dermatology Medication Tips: Please keep the boxes that topical medications come in in order to help keep track of the instructions about where and how to use these. Pharmacies typically print the medication instructions only on the boxes and not directly on the medication tubes.   If your medication is too expensive, please contact our office at [phone] option 4 or send [place] a message through [place].   We are unable to tell what your co-pay for medications will be in advance as this is different depending on your insurance coverage. However, we may be able to find a substitute medication at  lower cost or fill out paperwork to get insurance to cover a needed medication.   If a prior authorization is required to get your medication covered by your insurance company, please allow [place] 1-2 business days to complete this process.  Drug prices often vary depending on where the prescription is filled and some pharmacies may offer cheaper prices.  The website www.goodrx.com contains coupons for medications through different pharmacies. The prices here do not account for what the cost may be with help from insurance (it may be cheaper with your insurance), but the website can give you the price if you did not use any insurance.  - You can print the associated coupon and take it with your prescription to the pharmacy.  - You may also stop by our office during regular business hours and pick up a  GoodRx coupon card.  - If you need your prescription sent electronically to a different pharmacy, notify our office through [facility] or by phone at [phone] option 4.     Si Usted Necesita Algo Despus de Su Visita  Tambin puede enviarnos un mensaje a travs de Pharmacist, community. Por lo general respondemos a los mensajes de MyChart en el transcurso de 1 a 2 das hbiles.  Para renovar recetas, por favor pida a su farmacia que se ponga en contacto con nuestra oficina. Harland Dingwall de fax es [place] [phone].  Si tiene un asunto urgente cuando la clnica est cerrada y que no puede esperar hasta el siguiente da hbil, puede llamar/localizar a su doctor(a) al nmero que aparece a continuacin.   Por favor, tenga en cuenta que aunque hacemos todo lo posible para estar disponibles para asuntos urgentes fuera del horario de [place], no estamos disponibles las 24 horas del da, los 7 das de la [place].   Si tiene un problema urgente y no puede comunicarse con nosotros, puede optar por buscar atencin mdica  en el consultorio de su doctor(a), en una clnica privada, en un centro de atencin urgente  o en una sala de emergencias.  Si tiene Engineering geologist, por favor llame inmediatamente al 911 o vaya a la sala de emergencias.  Nmeros de bper  - Dr. Nehemiah Massed: [phone]  - Dra. Moye: [phone]  - Dra. Nicole Kindred: [phone]  En caso de inclemencias del [place], por favor llame a Johnsie Kindred principal al [phone] para una actualizacin sobre el [place] de cualquier retraso o cierre.  Consejos para la medicacin en dermatologa: Por favor, guarde las cajas en las que vienen los medicamentos de uso tpico para ayudarle a seguir las instrucciones sobre dnde y cmo usarlos. Las farmacias generalmente imprimen las instrucciones del medicamento slo en las cajas y no directamente en los tubos del [place].   Si su medicamento es muy caro, por favor, pngase en contacto con Zigmund Daniel llamando al [phone] y presione la opcin 4 o envenos un mensaje a travs de Pharmacist, community.   No podemos decirle cul ser su copago por los medicamentos por adelantado ya que esto es diferente dependiendo de la cobertura de su seguro. Sin embargo, es posible que podamos encontrar un medicamento sustituto a Electrical engineer un formulario para que el seguro cubra el medicamento que se considera necesario.   Si se requiere una autorizacin previa para que su compaa de seguros [place] su medicamento, por favor permtanos de 1 a 2 das hbiles para completar este proceso.  Los precios de los medicamentos varan con frecuencia dependiendo del Environmental consultant de dnde se surte la receta y alguna farmacias pueden ofrecer precios ms baratos.  El sitio web www.goodrx.com tiene cupones para medicamentos de Airline pilot. Los precios aqu no tienen en cuenta lo que podra costar con la ayuda del seguro (puede ser ms barato con su seguro), pero el sitio web puede darle el precio si no utiliz Research scientist (physical sciences).  - Puede imprimir el cupn correspondiente y llevarlo con su receta a la farmacia.  - Tambin  puede pasar por nuestra oficina durante el horario de atencin regular y Charity fundraiser una tarjeta de cupones de GoodRx.  - Si necesita que su receta se enve electrnicamente a una farmacia diferente, informe a nuestra oficina a travs de MyChart de [place] o por telfono llamando al [phone] y presione la opcin 4.

## 2023-01-19 ENCOUNTER — Inpatient Hospital Stay: Payer: Medicare Other | Attending: Oncology

## 2023-01-19 VITALS — BP 121/72 | HR 71 | Temp 99.1°F | Resp 18

## 2023-01-19 DIAGNOSIS — Z862 Personal history of diseases of the blood and blood-forming organs and certain disorders involving the immune mechanism: Secondary | ICD-10-CM

## 2023-01-19 DIAGNOSIS — D509 Iron deficiency anemia, unspecified: Secondary | ICD-10-CM | POA: Insufficient documentation

## 2023-01-19 MED ORDER — SODIUM CHLORIDE 0.9 % IV SOLN
125.0000 mg | INTRAVENOUS | Status: DC
  Administered 2023-01-19: 125 mg via INTRAVENOUS
  Filled 2023-01-19: qty 10

## 2023-01-19 MED ORDER — SODIUM CHLORIDE 0.9 % IV SOLN
INTRAVENOUS | Status: DC
  Filled 2023-01-19: qty 250

## 2023-01-19 MED ORDER — CETIRIZINE HCL 10 MG/ML IV SOLN
10.0000 mg | INTRAVENOUS | Status: DC
  Administered 2023-01-19: 10 mg via INTRAVENOUS
  Filled 2023-01-19: qty 1

## 2023-01-19 NOTE — Patient Instructions (Signed)
Sodium Ferric Gluconate Complex Injection What is this medication? SODIUM FERRIC GLUCONATE COMPLEX (SOE dee um FER ik GLOO koe nate KOM pleks) treats low levels of iron (iron deficiency anemia) in people with kidney disease. Iron is a mineral that plays an important role in making red blood cells, which carry oxygen from your lungs to the rest of your body. This medicine may be used for other purposes; ask your health care provider or pharmacist if you have questions. COMMON BRAND NAME(S): Ferrlecit, Nulecit What should I tell my care team before I take this medication? They need to know if you have any of the following conditions: Anemia that is not from iron deficiency High levels of iron in the blood An unusual or allergic reaction to iron, other medications, foods, dyes, or preservatives Pregnant or are trying to become pregnant Breast-feeding How should I use this medication? This medication is injected into a vein. It is given by your care team in a hospital or clinic setting. Talk to your care team about the use of this medication in children. While it may be prescribed for children as young as 6 years for selected conditions, precautions do apply. Overdosage: If you think you have taken too much of this medicine contact a poison control center or emergency room at once. NOTE: This medicine is only for you. Do not share this medicine with others. What if I miss a dose? It is important not to miss your dose. Call your care team if you are unable to keep an appointment. What may interact with this medication? Do not take this medication with any of the following: Deferasirox Deferoxamine Dimercaprol This medication may also interact with the following: Other iron products This list may not describe all possible interactions. Give your health care provider a list of all the medicines, herbs, non-prescription drugs, or dietary supplements you use. Also tell them if you smoke, drink  alcohol, or use illegal drugs. Some items may interact with your medicine. What should I watch for while using this medication? Your condition will be monitored carefully while you are receiving this medication. Visit your care team for regular checks on your progress. You may need blood work while you are taking this medication. What side effects may I notice from receiving this medication? Side effects that you should report to your care team as soon as possible: Allergic reactions--skin rash, itching, hives, swelling of the face, lips, tongue, or throat Low blood pressure--dizziness, feeling faint or lightheaded, blurry vision Shortness of breath Side effects that usually do not require medical attention (report to your care team if they continue or are bothersome): Flushing Headache Joint pain Muscle pain Nausea Pain, redness, or irritation at injection site This list may not describe all possible side effects. Call your doctor for medical advice about side effects. You may report side effects to FDA at 1-800-FDA-1088. Where should I keep my medication? This medication is given in a hospital or clinic and will not be stored at home. NOTE: This sheet is a summary. It may not cover all possible information. If you have questions about this medicine, talk to your doctor, pharmacist, or health care provider.  2023 Elsevier/Gold Standard (2021-01-25 00:00:00)  

## 2023-01-21 ENCOUNTER — Other Ambulatory Visit (HOSPITAL_COMMUNITY): Payer: Self-pay

## 2023-01-21 ENCOUNTER — Telehealth: Payer: Self-pay

## 2023-01-21 ENCOUNTER — Encounter: Payer: Self-pay | Admitting: Oncology

## 2023-01-21 NOTE — Telephone Encounter (Signed)
A new telephone call encounter has been made for this PA Request-please see telephone note dated 01/21/2023. 

## 2023-01-21 NOTE — Telephone Encounter (Signed)
Pharmacy Patient Advocate Encounter  Prior Authorization for Bernita Raisin 50MG  tablets has been approved by [employer] (ins).    PA # PA Case ID: [id number] Effective dates: 01/21/2023 through 01/21/2024

## 2023-01-21 NOTE — Telephone Encounter (Signed)
Pharmacy Patient Advocate Encounter   Received notification from [facility] that prior authorization for Ubrelvy 50 MG Tablet is required/requested.   PA submitted on 01/21/2023 to (ins) [employer] via [employer] or [facility]) confirmation # [id number] Status is pending

## 2023-01-22 NOTE — Telephone Encounter (Signed)
Sent mychart to pt that PA approved.

## 2023-01-26 ENCOUNTER — Encounter: Payer: Self-pay | Admitting: Family Medicine

## 2023-01-26 ENCOUNTER — Telehealth: Payer: Self-pay

## 2023-01-26 NOTE — Telephone Encounter (Signed)
-----   Message from Willeen Niece, MD sent at 01/26/2023  1:39 PM EDT ----- Skin , left lower calf JUNCTIONAL DYSPLASTIC MELANOCYTIC NEVUS WITH MODERATE TO SEVERE ATYPIA, CLOSE TO MARGIN, SEE DESCRIPTION  Moderately to severely atypical mole- needs repeat shave removal   - please call patient

## 2023-01-26 NOTE — Telephone Encounter (Signed)
Advised patient biopsy of the left lower calf was  JUNCTIONAL DYSPLASTIC MELANOCYTIC NEVUS WITH MODERATE TO SEVERE ATYPIA, CLOSE TO MARGIN, and needs repeat shave removal. Scheduled 03/16/23 at 8:30 AM.

## 2023-01-27 ENCOUNTER — Encounter: Payer: Self-pay | Admitting: Psychiatry

## 2023-01-29 ENCOUNTER — Encounter: Payer: Self-pay | Admitting: *Deleted

## 2023-01-29 NOTE — Telephone Encounter (Signed)
Patient asking for a letter to be allowed wearing visors,  with a dx and symptom of photophobia.    Dear Autumn Zhang  , please write a note and let me sign it.   Yes , of course we can , I will ask Dr Quentin Mulling nurse to write a letter on your behalf that I can sign. Melvyn Novas, MD

## 2023-02-03 ENCOUNTER — Telehealth: Payer: Self-pay | Admitting: Family Medicine

## 2023-02-03 MED ORDER — PANCRELIPASE (LIP-PROT-AMYL) 12000-38000 UNITS PO CPEP: ORAL_CAPSULE | ORAL | 0 refills | Status: DC

## 2023-02-03 NOTE — Telephone Encounter (Signed)
[facility] pharmacy requesting prescription refill lipase/protease/amylase (CREON) 12000-38000 units CPEP capsule   Please advise

## 2023-03-16 ENCOUNTER — Encounter: Payer: Self-pay | Admitting: Dermatology

## 2023-03-16 ENCOUNTER — Ambulatory Visit (INDEPENDENT_AMBULATORY_CARE_PROVIDER_SITE_OTHER): Payer: Medicare Other | Admitting: Dermatology

## 2023-03-16 VITALS — BP 115/65

## 2023-03-16 DIAGNOSIS — D239 Other benign neoplasm of skin, unspecified: Secondary | ICD-10-CM

## 2023-03-16 DIAGNOSIS — L570 Actinic keratosis: Secondary | ICD-10-CM | POA: Diagnosis not present

## 2023-03-16 DIAGNOSIS — W908XXA Exposure to other nonionizing radiation, initial encounter: Secondary | ICD-10-CM | POA: Diagnosis not present

## 2023-03-16 DIAGNOSIS — Z85828 Personal history of other malignant neoplasm of skin: Secondary | ICD-10-CM | POA: Diagnosis not present

## 2023-03-16 DIAGNOSIS — L814 Other melanin hyperpigmentation: Secondary | ICD-10-CM

## 2023-03-16 DIAGNOSIS — D2371 Other benign neoplasm of skin of right lower limb, including hip: Secondary | ICD-10-CM

## 2023-03-16 NOTE — Progress Notes (Unsigned)
   Follow-Up Visit   Subjective  Autumn Zhang is a 52 y.o. female who presents for the following: Moderate to severe dysplastic nevus,L lower calf,  bx proven, pt presents for shave removal, check spot R lower leg, 30m, no symptoms   The following portions of the chart were reviewed this encounter and updated as appropriate: medications, allergies, medical history  Review of Systems:  No other skin or systemic complaints except as noted in HPI or Assessment and Plan.  Objective  Well appearing patient in no apparent distress; mood and affect are within normal limits.   A focused examination was performed of the following areas: Left lower leg  Relevant exam findings are noted in the Assessment and Plan.  L lower calf Pink bx site 0.6cm  R mid pretibia x 2 (2) Pink scaly macules    Assessment & Plan     Dysplastic nevus L lower calf  Moderate to severe dysplastic nevus, bx proven, shave removal today  Epidermal / dermal shaving - L lower calf  Lesion diameter (cm):  0.6 Informed consent: discussed and consent obtained   Patient was prepped and draped in usual sterile fashion: area prepped with alcohol. Anesthesia: the lesion was anesthetized in a standard fashion   Local anesthetic: Bupivicaine. Instrument used: flexible razor blade   Hemostasis achieved with: pressure, aluminum chloride and electrodesiccation   Outcome: patient tolerated procedure well   Post-procedure details: wound care instructions given   Post-procedure details comment:  Ointment and small bandage applied Additional details:  Post txt defect   Specimen 1 - Surgical pathology Differential Diagnosis: D48.5 Bx proven moderate to severe dysplastic nevus  Check Margins: yes Pink bx site 0.6cm [id number]  AK (actinic keratosis) (2) R mid pretibia x 2  Vs ISK  Destruction of lesion - R mid pretibia x 2  Destruction method: cryotherapy   Informed consent: discussed and consent obtained    Lesion destroyed using liquid nitrogen: Yes   Region frozen until ice ball extended beyond lesion: Yes   Outcome: patient tolerated procedure well with no complications   Post-procedure details: wound care instructions given   Additional details:  Prior to procedure, discussed risks of blister formation, small wound, skin dyspigmentation, or rare scar following cryotherapy. Recommend Vaseline ointment to treated areas while healing.     Return for as scheduled .  I, Ardis Rowan, RMA, am acting as scribe for Willeen Niece, MD .   Documentation: I have reviewed the above documentation for accuracy and completeness, and I agree with the above.  Willeen Niece, MD

## 2023-03-16 NOTE — Patient Instructions (Addendum)
Wound Care Instructions  Cleanse wound gently with soap and water once a day then pat dry with clean gauze. Apply a thin coat of Petrolatum (petroleum jelly, "Vaseline") over the wound (unless you have an allergy to this). We recommend that you use a new, sterile tube of Vaseline. Do not pick or remove scabs. Do not remove the yellow or white "healing tissue" from the base of the wound.  Cover the wound with fresh, clean, nonstick gauze and secure with paper tape. You may use Band-Aids in place of gauze and tape if the wound is small enough, but would recommend trimming much of the tape off as there is often too much. Sometimes Band-Aids can irritate the skin.  You should call the office for your biopsy report after 1 week if you have not already been contacted.  If you experience any problems, such as abnormal amounts of bleeding, swelling, significant bruising, significant pain, or evidence of infection, please call the office immediately.  FOR ADULT SURGERY PATIENTS: If you need something for pain relief you may take 1 extra strength Tylenol (acetaminophen) AND 2 Ibuprofen (200mg each) together every 4 hours as needed for pain. (do not take these if you are allergic to them or if you have a reason you should not take them.) Typically, you may only need pain medication for 1 to 3 days.     Due to recent changes in healthcare laws, you may see results of your pathology and/or laboratory studies on MyChart before the doctors have had a chance to review them. We understand that in some cases there may be results that are confusing or concerning to you. Please understand that not all results are received at the same time and often the doctors may need to interpret multiple results in order to provide you with the best plan of care or course of treatment. Therefore, we ask that you please give us 2 business days to thoroughly review all your results before contacting the office for clarification. Should  we see a critical lab result, you will be contacted sooner.   If You Need Anything After Your Visit  If you have any questions or concerns for your doctor, please call our main line at [phone] and press option 4 to reach your doctor's medical assistant. If no one answers, please leave a voicemail as directed and we will return your call as soon as possible. Messages left after 4 pm will be answered the following business day.   You may also send us a message via MyChart. We typically respond to MyChart messages within 1-2 business days.  For prescription refills, please ask your pharmacy to contact our office. Our fax number is [phone].  If you have an urgent issue when the clinic is closed that cannot wait until the next business day, you can page your doctor at the number below.    Please note that while we do our best to be available for urgent issues outside of office hours, we are not available 24/7.   If you have an urgent issue and are unable to reach us, you may choose to seek medical care at your doctor's office, retail clinic, urgent care center, or emergency room.  If you have a medical emergency, please immediately call 911 or go to the emergency department.  Pager Numbers  - Dr. Kowalski: [phone]  - Dr. Moye: [phone]  - Dr. Stewart: [phone]  In the event of inclement weather, please call our main line at   [phone] for an update on the status of any delays or closures.  Dermatology Medication Tips: Please keep the boxes that topical medications come in in order to help keep track of the instructions about where and how to use these. Pharmacies typically print the medication instructions only on the boxes and not directly on the medication tubes.   If your medication is too expensive, please contact our office at [phone] option 4 or send us a message through MyChart.   We are unable to tell what your co-pay for medications will be in  advance as this is different depending on your insurance coverage. However, we may be able to find a substitute medication at lower cost or fill out paperwork to get insurance to cover a needed medication.   If a prior authorization is required to get your medication covered by your insurance company, please allow us 1-2 business days to complete this process.  Drug prices often vary depending on where the prescription is filled and some pharmacies may offer cheaper prices.  The website www.goodrx.com contains coupons for medications through different pharmacies. The prices here do not account for what the cost may be with help from insurance (it may be cheaper with your insurance), but the website can give you the price if you did not use any insurance.  - You can print the associated coupon and take it with your prescription to the pharmacy.  - You may also stop by our office during regular business hours and pick up a GoodRx coupon card.  - If you need your prescription sent electronically to a different pharmacy, notify our office through [place] MyChart or by phone at [phone] option 4.     Si Usted Necesita Algo Despus de Su Visita  Tambin puede enviarnos un mensaje a travs de MyChart. Por lo general respondemos a los mensajes de MyChart en el transcurso de 1 a 2 das hbiles.  Para renovar recetas, por favor pida a su farmacia que se ponga en contacto con nuestra oficina. Nuestro nmero de fax es el [phone].  Si tiene un asunto urgente cuando la clnica est cerrada y que no puede esperar hasta el siguiente da hbil, puede llamar/localizar a su doctor(a) al nmero que aparece a continuacin.   Por favor, tenga en cuenta que aunque hacemos todo lo posible para estar disponibles para asuntos urgentes fuera del horario de oficina, no estamos disponibles las 24 horas del da, los 7 das de la semana.   Si tiene un problema urgente y no puede comunicarse con nosotros, puede  optar por buscar atencin mdica  en el consultorio de su doctor(a), en una clnica privada, en un centro de atencin urgente o en una sala de emergencias.  Si tiene una emergencia mdica, por favor llame inmediatamente al 911 o vaya a la sala de emergencias.  Nmeros de bper  - Dr. Kowalski: [phone]  - Dra. Moye: [phone]  - Dra. Stewart: [phone]  En caso de inclemencias del tiempo, por favor llame a nuestra lnea principal al [phone] para una actualizacin sobre el estado de cualquier retraso o cierre.  Consejos para la medicacin en dermatologa: Por favor, guarde las cajas en las que vienen los medicamentos de uso tpico para ayudarle a seguir las instrucciones sobre dnde y cmo usarlos. Las farmacias generalmente imprimen las instrucciones del medicamento slo en las cajas y no directamente en los tubos del medicamento.   Si su medicamento es muy caro, por favor, pngase en contacto con   nuestra oficina llamando al [phone] y presione la opcin 4 o envenos un mensaje a travs de MyChart.   No podemos decirle cul ser su copago por los medicamentos por adelantado ya que esto es diferente dependiendo de la cobertura de su seguro. Sin embargo, es posible que podamos encontrar un medicamento sustituto a menor costo o llenar un formulario para que el seguro cubra el medicamento que se considera necesario.   Si se requiere una autorizacin previa para que su compaa de seguros cubra su medicamento, por favor permtanos de 1 a 2 das hbiles para completar este proceso.  Los precios de los medicamentos varan con frecuencia dependiendo del lugar de dnde se surte la receta y alguna farmacias pueden ofrecer precios ms baratos.  El sitio web www.goodrx.com tiene cupones para medicamentos de diferentes farmacias. Los precios aqu no tienen en cuenta lo que podra costar con la ayuda del seguro (puede ser ms barato con su seguro), pero el sitio web puede darle el  precio si no utiliz ningn seguro.  - Puede imprimir el cupn correspondiente y llevarlo con su receta a la farmacia.  - Tambin puede pasar por nuestra oficina durante el horario de atencin regular y recoger una tarjeta de cupones de GoodRx.  - Si necesita que su receta se enve electrnicamente a una farmacia diferente, informe a nuestra oficina a travs de MyChart de [place] o por telfono llamando al [phone] y presione la opcin 4.  

## 2023-03-25 ENCOUNTER — Telehealth: Payer: Self-pay

## 2023-03-25 NOTE — Telephone Encounter (Signed)
Advised pt of bx results/sh ?

## 2023-03-25 NOTE — Telephone Encounter (Signed)
-----   Message from Willeen Niece, MD sent at 03/24/2023  7:22 PM EDT ----- Skin , left lower calf NO RESIDUAL DYSPLASTIC NEVUS, MARGINS FREE   - please call patient

## 2023-04-11 ENCOUNTER — Encounter: Payer: Self-pay | Admitting: Family Medicine

## 2023-04-11 ENCOUNTER — Other Ambulatory Visit: Payer: Self-pay | Admitting: Family Medicine

## 2023-04-13 ENCOUNTER — Other Ambulatory Visit: Payer: Self-pay | Admitting: Psychiatry

## 2023-04-13 ENCOUNTER — Telehealth (INDEPENDENT_AMBULATORY_CARE_PROVIDER_SITE_OTHER): Payer: Medicare Other | Admitting: Family Medicine

## 2023-04-13 DIAGNOSIS — R6884 Jaw pain: Secondary | ICD-10-CM

## 2023-04-13 DIAGNOSIS — M26609 Unspecified temporomandibular joint disorder, unspecified side: Secondary | ICD-10-CM | POA: Diagnosis not present

## 2023-04-13 DIAGNOSIS — M2530 Other instability, unspecified joint: Secondary | ICD-10-CM

## 2023-04-13 DIAGNOSIS — R479 Unspecified speech disturbances: Secondary | ICD-10-CM

## 2023-04-13 DIAGNOSIS — R220 Localized swelling, mass and lump, head: Secondary | ICD-10-CM

## 2023-04-13 NOTE — Assessment & Plan Note (Signed)
Chronic. Has worsened over the past year, especially within the past month due to a fall. Requesting text to speech assistance and referral for proper imaging. DME referral sent to [facility]  [place] pending recs from outside providers

## 2023-04-13 NOTE — Progress Notes (Signed)
MyChart Video Visit    Virtual Visit via Video Note   This format is felt to be most appropriate for this patient at this time. Physical exam was limited by quality of the video and audio technology used for the visit.   Patient location: Home Provider location: [facility]  I discussed the limitations of evaluation and management by telemedicine and the availability of in person appointments. The patient expressed understanding and agreed to proceed.  Patient: Autumn Zhang   DOB: 1971/02/17   52 y.o. Female  MRN: [id number] Visit Date: 04/13/2023  Today's healthcare provider: Shirlee Latch, MD   Chief Complaint  Patient presents with   Medical Management of Chronic Issues   Subjective    Tonica is here today for management of her chronic conditions She has had worsening of her TMJ due to a fall within the past month. However over the past year, she has been having increasing difficulties with joint instability. This has lead to problems with speech, eating, and general functions of daily life. She is having to wear a wrap / brace at all time around her face for jaw stability. She would like to discuss getting an adaptive text to speech function to allow her to communicate better. Additionally, she had imaging of her jaw in 2022, but would like to know if further imaging could be done due to her fall. She has also had increased facial swelling and would like a compression device to help manage that. Also she wanted to know if any medical management was available, specifically a referral to PT    Medications: Outpatient Medications Prior to Visit  Medication Sig   acetaminophen (TYLENOL) 500 MG tablet Take 500 mg by mouth every 6 (six) hours as needed.   albuterol (PROVENTIL HFA;VENTOLIN HFA) 108 (90 Base) MCG/ACT inhaler Inhale 2 puffs into the lungs every 4 (four) hours as needed.    aspirin 325 MG tablet Take 325 mg by mouth.   Atogepant (QULIPTA) 10 MG TABS  Take 1 tablet by mouth daily.   azelastine (ASTELIN) 0.1 % nasal spray Place into both nostrils as needed for rhinitis. Use in each nostril as directed   Blood Pressure Monitoring (BLOOD PRESSURE CUFF) MISC Take blood pressure as needed for symptoms.   Boswellia Serrata (BOSWELLIA PO) Take 150 mg by mouth every morning.    Calcium Citrate 200 MG TABS Take 600 mg by mouth.   cromolyn (GASTROCROM) 100 MG/5ML solution Take by mouth 4 (four) times daily -  before meals and at bedtime.   Cyanocobalamin (VITAMIN B-12) 1000 MCG SUBL Place under the tongue.   desloratadine (CLARINEX) 5 MG tablet Take 5 mg by mouth as needed.   Diclofenac Sodium 1.5 % SOLN 40 drops Externally Four times a day   diphenhydrAMINE (BENADRYL) 12.5 MG/5ML liquid Take by mouth 4 (four) times daily as needed.   diphenhydrAMINE-Allantoin 2-0.5 % CREA Apply 1 application topically as needed.    docusate sodium (COLACE) 100 MG capsule Take 100 mg by mouth every other day.    EPINEPHrine 0.3 mg/0.3 mL IJ SOAJ injection INJECT ONE AUTO-INJECTOR INTRAMUSCULARLY AS NEEDED   estradiol (ESTRACE) 0.1 MG/GM vaginal cream INSERT 1 GRAM VAGINALLY TWICE WEEKLY   famotidine (PEPCID) 10 MG tablet Take 10 mg by mouth daily.    fexofenadine (ALLEGRA) 180 MG tablet Take 180 mg by mouth 2 (two) times daily.   Folic Acid (FOLATE PO) Take 0.5 tablets by mouth daily. Folate as calcium foliate  680 mcg DFE   guaifenesin (HUMIBID E) 400 MG TABS tablet Take 400 mg by mouth as needed.   hydrOXYzine (ATARAX) 25 MG tablet Take 25 mg by mouth as needed.   IODINE EX Apply 225 mcg topically. Every 3 days   KETAMINE HCL SL Place into right nostril as needed.   ketotifen (ZADITOR) 0.025 % ophthalmic solution Place 1 drop into both eyes daily as needed.   Ketotifen Fumarate POWD 0.5 mg by Does not apply route. 6-12 times a day   L-Lysine 500 MG CAPS Take by mouth 3 (three) times daily.   L-Lysine HCl 500 MG CAPS Take by mouth.   L-Theanine 100 MG CAPS Take 200  mg by mouth as needed.   levocetirizine (XYZAL) 2.5 MG/5ML solution as needed.   Light Mineral Oil-Mineral Oil 0.5-0.5 % EMUL Apply 1 application to eye daily.   lipase/protease/amylase (CREON) 12000-38000 units CPEP capsule TAKE 1-3 CAPSULES (12,000-36,000 UNITS TOTAL) BY MOUTH 3 (THREE) TIMES DAILY BEFORE MEALS. PATIENT MAY TAKE AN ADDITIONAL 3 CAPSULES PER DAY FOR SNACK.   loperamide (IMODIUM) 2 MG capsule Take by mouth as needed for diarrhea or loose stools.   Lutein-Zeaxanthin 25-5 MG CAPS Take 1 capsule by mouth every other day.   Magnesium Citrate POWD Take 100 mg by mouth daily.   Melatonin 1 MG/ML LIQD Take by mouth daily.   montelukast (SINGULAIR) 5 MG chewable tablet Chew 5 mg by mouth in the morning and at bedtime.   Non Gelatin Capsules, Empty, (CAPSULE #3 CLEAR/CLEAR VEG) CAPS Take 1 capsule by mouth daily.   NONFORMULARY OR COMPOUNDED ITEM Bupivacaine nasal 0.5% drops w/o epi   omega-3 acid ethyl esters (LOVAZA) 1 g capsule TAKE 2 CAPSULES BY MOUTH 2 TIMES DAILY.   PHOSPHATIDYL CHOLINE PO Take 1 tablet by mouth daily.   pseudoephedrine (SUDAFED) 30 MG tablet Take 30 mg by mouth every 4 (four) hours as needed for congestion.    pyridostigmine (MESTINON) 60 MG tablet Take 15 mg by mouth. 3-4 times a day   Quercetin 500 MG CAPS Take by mouth as needed.   Riboflavin (VITAMIN B-2 PO) Take 100 mg by mouth 3 (three) times daily.   Roflumilast (ZORYVE) 0.3 % CREA Apply to elbows once daily.   Selenium 200 MCG CAPS Take 1 capsule by mouth. Twice a month   simethicone (MYLICON) 80 MG chewable tablet Chew 80 mg by mouth every 6 (six) hours as needed for flatulence.   Sodium Fluoride 1.1 % PSTE Place onto teeth.   Suvorexant (BELSOMRA) 5 MG TABS 1 tablet at bedtime as needed Orally Once a day   trolamine salicylate (ASPERCREME/ALOE) 10 % cream Apply topically.   Ubrogepant (UBRELVY) 50 MG TABS Take 50 mg by mouth as needed (for migriane). May repeat a dose in 2 hours if headache persists    vitamin C (ASCORBIC ACID) 500 MG tablet Take 500 mg by mouth daily.   Vitamin D, Cholecalciferol, 10 MCG (400 UNIT) TABS Take by mouth daily.    No facility-administered medications prior to visit.    Review of Systems  Constitutional:  Positive for activity change.  HENT:  Positive for facial swelling and trouble swallowing.   Musculoskeletal:  Positive for arthralgias, joint swelling, myalgias and neck pain.     Objective    There were no vitals taken for this visit.  Physical Exam unable to be performed due to nature of virtual visit.    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 Has worsened over the past year, especially within the past month due to a fall. Requesting text to speech assistance and referral for proper imaging. DME referral sent to [facility]  [place] pending recs from outside providers      Relevant Orders   Ambulatory referral to Physical Therapy     Other   Chronic jaw pain (Left) (Chronic)   Relevant Orders   Ambulatory referral to Physical Therapy   Difficulty with speech    Chronic, worsening over the past year. Will evaluate with imaging and aid in getting devices to assist with activities of daily living.  CTM Consider speech / occupational therapy referral if no improvement or worsening      Facial swelling    New. Is having worsening facial swelling at the end of the day. Is requesting a compression device to help with swelling. DME referral for facial compression device      Other Visit Diagnoses     Instability of joint       Relevant Orders   Ambulatory referral to Physical Therapy        Return if symptoms worsen or fail to improve.     I discussed the assessment and treatment plan with the patient. The patient was provided an opportunity to ask questions and all were answered. The patient agreed with  the plan and demonstrated an understanding of the instructions.   The patient was advised to call back or seek an in-person evaluation if the symptoms worsen or if the condition fails to improve as anticipated.  Patient seen along with MS3 student Jodi Marble. I personally evaluated this patient along with the student, and verified all aspects of the history, physical exam, and medical decision making as documented by the student. I agree with the student's documentation and have made all necessary edits.  Silvano Garofano, Marzella Schlein, MD, MPH [facility]

## 2023-04-13 NOTE — Assessment & Plan Note (Signed)
New. Is having worsening facial swelling at the end of the day. Is requesting a compression device to help with swelling. DME referral for facial compression device

## 2023-04-13 NOTE — Telephone Encounter (Signed)
Last seen on 10/23/22 Follow up scheduled on 05/28/23

## 2023-04-13 NOTE — Assessment & Plan Note (Signed)
Chronic, worsening over the past year. Will evaluate with imaging and aid in getting devices to assist with activities of daily living.  CTM Consider speech / occupational therapy referral if no improvement or worsening

## 2023-04-16 ENCOUNTER — Encounter: Payer: Self-pay | Admitting: Family Medicine

## 2023-04-16 ENCOUNTER — Telehealth: Payer: Self-pay

## 2023-04-16 LAB — HM MAMMOGRAPHY

## 2023-04-16 NOTE — Telephone Encounter (Signed)
I don't know either. Patient asked for this device due to communication difficulties. Patient may need to do research further into what she is looking for.

## 2023-04-16 NOTE — Telephone Encounter (Signed)
Copied from CRM [id number]. Topic: General - Other >> Apr 14, 2023  1:56 PM Dondra Prader E wrote: Reason for CRM: Text to speech device is not available, and they are not sure where to find this.   Diannia Ruder calling from [employer]

## 2023-04-20 DIAGNOSIS — R479 Unspecified speech disturbances: Secondary | ICD-10-CM

## 2023-04-20 DIAGNOSIS — M26609 Unspecified temporomandibular joint disorder, unspecified side: Secondary | ICD-10-CM

## 2023-04-20 NOTE — Telephone Encounter (Signed)
Messaged patient through mychart.

## 2023-04-22 ENCOUNTER — Encounter (INDEPENDENT_AMBULATORY_CARE_PROVIDER_SITE_OTHER): Payer: Medicare Other | Admitting: Psychiatry

## 2023-04-22 ENCOUNTER — Other Ambulatory Visit: Payer: Self-pay | Admitting: Psychiatry

## 2023-04-22 ENCOUNTER — Encounter: Payer: Self-pay | Admitting: *Deleted

## 2023-04-22 ENCOUNTER — Encounter: Payer: Self-pay | Admitting: Oncology

## 2023-04-22 DIAGNOSIS — G44309 Post-traumatic headache, unspecified, not intractable: Secondary | ICD-10-CM

## 2023-04-22 NOTE — Telephone Encounter (Signed)
Please see the MyChart message reply(ies) for my assessment and plan.    This patient gave consent for this Medical Advice Message and is aware that it may result in a bill to [employer], as well as the possibility of receiving a bill for a co-payment or deductible. They are an established patient, but are not seeking medical advice exclusively about a problem treated during an in person or video visit in the last seven days. I did not recommend an in person or video visit within seven days of my reply.    I spent a total of 5 minutes cumulative time within 7 days through [employer].  Ocie Doyne, MD  04/22/23 3:47 PM

## 2023-04-24 ENCOUNTER — Ambulatory Visit
Admission: RE | Admit: 2023-04-24 | Discharge: 2023-04-24 | Disposition: A | Discharge status: Home / Self Care | Payer: Medicare Other | Source: Ambulatory Visit | Attending: Psychiatry | Admitting: Psychiatry

## 2023-04-24 DIAGNOSIS — G44309 Post-traumatic headache, unspecified, not intractable: Secondary | ICD-10-CM | POA: Diagnosis not present

## 2023-04-27 ENCOUNTER — Encounter: Payer: Self-pay | Admitting: *Deleted

## 2023-04-28 ENCOUNTER — Ambulatory Visit (INDEPENDENT_AMBULATORY_CARE_PROVIDER_SITE_OTHER): Payer: Medicare Other | Admitting: Family Medicine

## 2023-04-28 ENCOUNTER — Other Ambulatory Visit: Payer: Self-pay | Admitting: *Deleted

## 2023-04-28 VITALS — BP 115/75 | HR 80 | Temp 99.2°F | Ht 61.0 in | Wt 144.0 lb

## 2023-04-28 DIAGNOSIS — I890 Lymphedema, not elsewhere classified: Secondary | ICD-10-CM

## 2023-04-28 DIAGNOSIS — Z862 Personal history of diseases of the blood and blood-forming organs and certain disorders involving the immune mechanism: Secondary | ICD-10-CM

## 2023-04-28 NOTE — Progress Notes (Addendum)
   Established Patient Office Visit  Subjective   Patient ID: Autumn Zhang, female    DOB: 1970/10/01  Age: 52 y.o. MRN: [id number]  Chief Complaint  Patient presents with   Edema    Patient reports swelling in hands, feet and face.  Patient is here because there has been a change in Medicare coverage for lymphedema devices.  Patient states she does not know if she has lymphedema but wanted to see if this was something that might help her swelling.   Autumn Zhang is here with swelling of her hands, legs, and face. This is a chronic issue but has been worsening over the past couple of months. It is usually worse at night and improves with use of her current compression devices. She is in touch with a medical device company for compression devices. They need to know if she has lymphedema for their billing purposes.  She is also wondering if we can see the results of her most recent head CT.  She has no other questions or concerns today.    Review of Systems  Cardiovascular:  Positive for leg swelling.  Musculoskeletal:  Positive for joint pain and myalgias.      Objective:     BP 115/75 (BP Location: Left Arm, Patient Position: Sitting, Cuff Size: Normal)   Pulse 80   Temp 99.2 F (37.3 C) (Oral)   Ht 5\' 1"  (1.549 m)   Wt 144 lb (65.3 kg)   SpO2 99%   BMI 27.21 kg/m   Physical Exam Constitutional:      Appearance: Normal appearance.  Musculoskeletal:     Right lower leg: Edema present.     Left lower leg: Edema present.  Neurological:     Mental Status: She is alert.     No results found for any visits on 04/28/23.  The 10-year ASCVD risk score (Arnett DK, et al., 2019) is: 1.1%    Assessment & Plan:   Problem List Items Addressed This Visit       Other   Lymphedema - Primary    Chronic but worsening  Prescriptions sent to medical device company [employer] for multiple compression devices per patient request CTM     Rx for day and night leg compression, foot and hand  compression and facial compression  Addendum: pt does have lipidemia in head, face, arms and legs and needs custom compression to control the swelling   Return if symptoms worsen or fail to improve.   Total time spent on today's visit was greater than 25 minutes, including both face-to-face time and nonface-to-face time personally spent on review of chart (labs and imaging), discussing labs and goals, discussing treatment options, answering patient's questions, and coordinating care.   Patient seen along with MS3 student Jodi Marble. I personally evaluated this patient along with the student, and verified all aspects of the history, physical exam, and medical decision making as documented by the student. I agree with the student's documentation and have made all necessary edits.  Agam Davenport, Marzella Schlein, MD, MPH [facility]

## 2023-04-28 NOTE — Assessment & Plan Note (Signed)
Chronic but worsening  Prescriptions sent to medical device company [employer] for multiple compression devices per patient request CTM

## 2023-05-07 ENCOUNTER — Ambulatory Visit: Payer: Medicare Other | Attending: Family Medicine

## 2023-05-07 DIAGNOSIS — R29898 Other symptoms and signs involving the musculoskeletal system: Secondary | ICD-10-CM | POA: Diagnosis not present

## 2023-05-07 DIAGNOSIS — R479 Unspecified speech disturbances: Secondary | ICD-10-CM | POA: Insufficient documentation

## 2023-05-07 DIAGNOSIS — G501 Atypical facial pain: Secondary | ICD-10-CM | POA: Diagnosis not present

## 2023-05-07 DIAGNOSIS — M2669 Other specified disorders of temporomandibular joint: Secondary | ICD-10-CM | POA: Diagnosis not present

## 2023-05-07 DIAGNOSIS — Q796 Ehlers-Danlos syndrome, unspecified: Secondary | ICD-10-CM | POA: Diagnosis not present

## 2023-05-07 DIAGNOSIS — R4782 Fluency disorder in conditions classified elsewhere: Secondary | ICD-10-CM | POA: Diagnosis not present

## 2023-05-07 DIAGNOSIS — M26609 Unspecified temporomandibular joint disorder, unspecified side: Secondary | ICD-10-CM | POA: Insufficient documentation

## 2023-05-07 NOTE — Therapy (Addendum)
OUTPATIENT SPEECH LANGUAGE PATHOLOGY  MOTOR SPEECH EVALUATION   Patient Name: Autumn Zhang MRN: [id number] DOB:March 23, 1971, 52 y.o., female Today's Date: 05/07/2023  PCP: Shirlee Latch, MD REFERRING PROVIDER: same   navigator section and refresh this SmartLink. 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   Urinary frequency 09/26/2021   Atypical facial pain (1ry area of Pain) (Left) 08/29/2021   Chronic ear pain (Left) 08/29/2021   Temporal headache (Left) 08/29/2021   Inner ear pain (Left) 08/29/2021   Trigeminal nerve disorder (Left) 08/29/2021   History of photophobia 08/29/2021   TMJ (temporomandibular joint syndrome) 08/29/2021   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   Polypharmacy 08/29/2021   Severe frontal headaches 08/29/2021   Chronic occipital headache (Midline) (2ry area of Pain) 08/29/2021   Chronic neck pain (posterior) (4th area of Pain) (Bilateral) (L>R) 08/29/2021   Musculoskeletal disorder involving upper trapezius muscle 08/29/2021   Musculoskeletal disorder involving sternocleidomastoid (Left) 08/29/2021   Abnormal MRI, cervical spine (02/11/2019) 08/29/2021   Shoulder stiffness (Right) 08/29/2021   Pharmacologic therapy 08/27/2021   Disorder of skeletal system 08/27/2021   Problems influencing health status 08/27/2021   GAD (generalized anxiety disorder) 07/17/2021   Difficulty walking 07/17/2021   Headache disorder (3ry area of Pain) 07/17/2021   Photophobia 07/17/2021   Difficulty with speech 07/17/2021   Tear film insufficiency 07/15/2021   Left lower  quadrant abdominal pain 07/05/2021   Palpitations 06/11/2021   Axillary lymphadenopathy 06/11/2021   Immunosuppressed status (HCC) 04/19/2021   History of seronegative inflammatory arthritis 06/28/2020   Osteopenia of multiple sites 06/14/2020   Polyarthralgia 06/12/2020   Iodine deficiency 06/12/2020   OSA on CPAP 06/12/2020   Chronic pain syndrome 06/12/2020   Psychophysiological insomnia 11/26/2019   Calcific tendinitis of shoulder (Right) 04/10/2019   History of iron deficiency anemia 11/04/2018   History of basal cell carcinoma 10/07/2018   Vaginal dryness 09/07/2018   Chronic jaw pain (Left) 07/15/2018   Chronic migraine with aura 07/15/2018   Ehlers-Danlos syndrome, type 3 07/15/2018   Rectocele 05/21/2018   Ehlers-Danlos syndrome 03/15/2018   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   Cardiac arrhythmia 01/21/2018   Polyp of colon 01/21/2018   High cholesterol 01/21/2018   Chronic constipation 01/21/2018   Instability of shoulder joint (Right) 01/12/2018   Trigeminal neuralgia (V1-3) (Left) 12/25/2017   Instability of shoulder joint (Left) 12/25/2017   Nausea 12/25/2017   Occipital neuralgia 12/25/2017   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   Hiatal hernia 10/14/2017   TMJ (dislocation of temporomandibular joint) 09/29/2017   Irritable bowel syndrome 07/02/2017   Anal fissure 07/02/2017   Anemia 07/02/2017   Gastroesophageal reflux disease 07/02/2017   Hemorrhoids 07/02/2017   Malignant neoplasm of skin 07/02/2017   Malignant tumor of colon (HCC) 07/02/2017   Arthritis 07/02/2017   Fibromyalgia 07/02/2017   Pulsatile tinnitus of ear (Left) 12/12/2016   Restless leg syndrome 08/01/2016   Cervical dystonia 03/21/2016   Cervical spine disease 03/21/2016   Cervicalgia 02/06/2016   Paresthesia 02/06/2016   Hypercholesterolemia 12/24/2015   Iron deficiency anemia 10/18/2015   Vitamin D deficiency disease 10/18/2015  Midline low back pain without sciatica 02/06/2015   Hyperglycemia 10/30/2014   History of colon cancer 09/27/2014   Postural orthostatic tachycardia syndrome 09/16/2003   Chronic fatigue 09/15/2000   Asthma 09/15/1988   Dyspnea 09/15/1988    ONSET DATE: difficulty communicating since 2022; referral 04/20/23  REFERRING DIAG: communication difficulty  THERAPY DIAG:  Difficulty with speech  Rationale for Evaluation and Treatment Rehabilitation  SUBJECTIVE:   SUBJECTIVE STATEMENT: Pt pleasant and cooperative. "The reason I wear my strap is actually that the strap helps keep my jaw in (or at least closer to) the joint socket.  Without it, the jaw becomes much more severely subluxed and, if I open my mouth, dislocates entirely." (Per pt email). Pt reports having chronic instability to TMJ joint to to EDS. Pt writing down everything to communicate.  Pt accompanied by: self  PERTINENT HISTORY: see problem list above   PAIN:  Are you having pain? Yes: NPRS scale: 5/10 Pain location: L lower face Pain description: burning Aggravating factors: talking Relieving factors: not talking; wearing strap   FALLS: Has patient fallen in last 6 months? Yes, "fell on 03/02/23 and have had a flare up of symptoms ever since." (Per pt email)  LIVING ENVIRONMENT: Lives with: lives with their spouse and chosen family member Lives in: House/apartment  PLOF:  Level of assistance: Needed assistance with IADLS Employment: On disability   PATIENT GOALS      to obtain a communication device  OBJECTIVE:  COGNITION: Overall cognitive status: Within functional limits for tasks assessed   MOTOR SPEECH:(assessed with and without sling) Without sling: Overall motor speech: Appears intact Respiration: diaphragmatic/abdominal breathing Phonation: normal Resonance: WFL Articulation: Appears intact Intelligibility: Intelligible With sling:   Reduced jaw ROM affecting overall speech intelligibility as  pt talking through a clenched jaw   ORAL MOTOR EXAMINATION: Facial : WFL; L facial swelling Lingual: WFL Velum: WFL Mandible: Comment: reduced ROM Voice: WFL      PATIENT REPORTED OUTCOME MEASURES (PROM): Attempted, but pt asked to discontine as it was too hard to fill out given fluctuations in symptoms       PATIENT EDUCATION: Education details: role of SLP, "Text to Speech" app, POC Person educated: Patient Education method: Explanation Education comprehension: verbalized understanding   HOME EXERCISE PROGRAM: TBD  GOALS: Goals reviewed with patient? Yes - briefly  SHORT TERM GOALS: Target date: 9 sessions    With Mod I, patient will add new vocabulary/messages to text-to-speech app in >90% opportunities in order to improve ability to communicate basic wants and needs.  Baseline: Goal status: INITIAL  2.  With Mod I, patient will use text-to-speech app or writing to communicate basic wants and needs in >90% of opportunities.  Baseline:  Goal status: INITIAL  3.  With Mod I, pt will text-to-speech app to communicate in a structured conversation setting with >90% accuracy.    Baseline:  Goal status: INITIAL    LONG TERM GOALS: Target date: 07/02/23  Pt will utilize multimodal communication for ease of communication in a variety of settings. Baseline:  Goal status: INITIAL  ASSESSMENT:  CLINICAL IMPRESSION: Patient is a 52 y.o. female who was seen today for a functional communication evaluation. Evaluation completed via functional/dynamic assessment. Pt with preference for writing and text-to-speech to communicate due to significant L facial pain due to chronic jaw instability in setting of Ehlers-Danlos Syndrome. Pt wears a sling to limited mandibular ROM to prevent "swelling" and "pain." Pt's speech without sling during brief evaluation was  functional. Pt with reduced speech intelligibility with sling in place to due reduced mandibular ROM. Pt requesting an  AAC device for improvement communication. Given pt's CLOF and ability to communicate via writing and text-to-speech functionality on phone, pt would not likely qualify for AAC device. Pt may benefit from use of text-to-speech apps with additional functionality to improve ease of communication in person and via telephone. Recommend trial of speech therapy to identify an appropriate app to meet pt's communication needs.  OBJECTIVE IMPAIRMENTS include  communication difficulty . These impairments are limiting patient from effectively communicating at home and in community. Factors affecting potential to achieve goals and functional outcome are co-morbidities, previous level of function, and severity of impairments. Patient will benefit from skilled SLP services to address above impairments and improve overall function.  REHAB POTENTIAL: Good  PLAN: SLP FREQUENCY: 1x/week  SLP DURATION: 8 weeks  PLANNED INTERVENTIONS: Multimodal communication approach, SLP instruction and feedback, and Patient/family education     [facility] [address] [place], [place], [address] Phone: [phone]   Fax:  [phone]

## 2023-05-11 ENCOUNTER — Telehealth: Payer: Self-pay | Admitting: Family Medicine

## 2023-05-11 ENCOUNTER — Ambulatory Visit: Payer: Medicare Other | Admitting: Speech Pathology

## 2023-05-11 NOTE — Telephone Encounter (Signed)
Addended 8/13 note. OK to resend to [employer]

## 2023-05-11 NOTE — Telephone Encounter (Signed)
Addended has been refaxed on 8/26 to [facility]

## 2023-05-11 NOTE — Telephone Encounter (Signed)
Jason with [employer] is calling in because they need an addendum to the last treatment notes they received. Barbara Cower says the form needs to state that the pt does have lipidemia in head, face, arms and legs and needs custom compression to control the swelling.  Fax: [phone]

## 2023-05-14 ENCOUNTER — Ambulatory Visit: Payer: Medicare Other

## 2023-05-14 ENCOUNTER — Other Ambulatory Visit: Payer: Self-pay | Admitting: Oncology

## 2023-05-14 DIAGNOSIS — Z006 Encounter for examination for normal comparison and control in clinical research program: Secondary | ICD-10-CM

## 2023-05-14 DIAGNOSIS — Q796 Ehlers-Danlos syndrome, unspecified: Secondary | ICD-10-CM | POA: Diagnosis not present

## 2023-05-14 DIAGNOSIS — R479 Unspecified speech disturbances: Secondary | ICD-10-CM

## 2023-05-14 NOTE — Therapy (Signed)
OUTPATIENT SPEECH LANGUAGE PATHOLOGY  MOTOR SPEECH EVALUATION   Patient Name: Autumn Zhang MRN: [id number] DOB:[birth year], 52 y.o., female Today's Date: 05/14/2023  PCP: Shirlee Latch, MD REFERRING PROVIDER: same   End of Session - 05/14/23 1243     Visit Number 2    Number of Visits 9    Date for SLP Re-Evaluation 07/02/23    SLP Start Time 1150    SLP Stop Time  1230    SLP Time Calculation (min) 40 min            navigator section and refresh this SmartLink. 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   Urinary frequency 09/26/2021   Atypical facial pain (1ry area of Pain) (Left) 08/29/2021   Chronic ear pain (Left) 08/29/2021   Temporal headache (Left) 08/29/2021   Inner ear pain (Left) 08/29/2021   Trigeminal nerve disorder (Left) 08/29/2021   History of photophobia 08/29/2021   TMJ (temporomandibular joint syndrome) 08/29/2021   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   Polypharmacy 08/29/2021   Severe frontal headaches 08/29/2021   Chronic occipital headache (Midline) (2ry area of Pain) 08/29/2021   Chronic neck pain (posterior) (4th area of Pain) (Bilateral) (L>R) 08/29/2021   Musculoskeletal disorder involving upper trapezius muscle 08/29/2021   Musculoskeletal disorder involving sternocleidomastoid (Left) 08/29/2021   Abnormal MRI, cervical spine (02/11/2019) 08/29/2021   Shoulder stiffness (Right) 08/29/2021   Pharmacologic therapy 08/27/2021   Disorder of skeletal system 08/27/2021   Problems influencing health status 08/27/2021   GAD (generalized anxiety disorder)  07/17/2021   Difficulty walking 07/17/2021   Headache disorder (3ry area of Pain) 07/17/2021   Photophobia 07/17/2021   Difficulty with speech 07/17/2021   Tear film insufficiency 07/15/2021   Left lower quadrant abdominal pain 07/05/2021   Palpitations 06/11/2021   Axillary lymphadenopathy 06/11/2021   Immunosuppressed status (HCC) 04/19/2021   History of seronegative inflammatory arthritis 06/28/2020   Osteopenia of multiple sites 06/14/2020   Polyarthralgia 06/12/2020   Iodine deficiency 06/12/2020   OSA on CPAP 06/12/2020   Chronic pain syndrome 06/12/2020   Psychophysiological insomnia 11/26/2019   Calcific tendinitis of shoulder (Right) 04/10/2019   History of iron deficiency anemia 11/04/2018   History of basal cell carcinoma 10/07/2018   Vaginal dryness 09/07/2018   Chronic jaw pain (Left) 07/15/2018   Chronic migraine with aura 07/15/2018   Ehlers-Danlos syndrome, type 3 07/15/2018   Rectocele 05/21/2018   Ehlers-Danlos syndrome 03/15/2018   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   Cardiac arrhythmia 01/21/2018   Polyp of colon 01/21/2018   High cholesterol 01/21/2018   Chronic constipation 01/21/2018   Instability of shoulder joint (Right) 01/12/2018   Trigeminal neuralgia (V1-3) (Left) 12/25/2017   Instability of shoulder joint (Left) 12/25/2017   Nausea 12/25/2017   Occipital neuralgia 12/25/2017   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   Hiatal hernia 10/14/2017   TMJ (dislocation of temporomandibular joint) 09/29/2017   Irritable bowel syndrome 07/02/2017   Anal fissure 07/02/2017   Anemia 07/02/2017   Gastroesophageal reflux disease 07/02/2017   Hemorrhoids 07/02/2017   Malignant neoplasm of skin 07/02/2017   Malignant tumor of  colon (HCC) 07/02/2017   Arthritis 07/02/2017   Fibromyalgia 07/02/2017   Pulsatile tinnitus of ear (Left) 12/12/2016   Restless leg syndrome 08/01/2016   Cervical dystonia 03/21/2016    Cervical spine disease 03/21/2016   Cervicalgia 02/06/2016   Paresthesia 02/06/2016   Hypercholesterolemia 12/24/2015   Iron deficiency anemia 10/18/2015   Vitamin D deficiency disease 10/18/2015   Midline low back pain without sciatica 02/06/2015   Hyperglycemia 10/30/2014   History of colon cancer 09/27/2014   Postural orthostatic tachycardia syndrome 09/16/2003   Chronic fatigue 09/15/2000   Asthma 09/15/1988   Dyspnea 09/15/1988    ONSET DATE: difficulty communicating since 2022; referral 04/20/23  REFERRING DIAG: communication difficulty  THERAPY DIAG:  Difficulty with speech  Rationale for Evaluation and Treatment Rehabilitation  SUBJECTIVE:   SUBJECTIVE STATEMENT: Pt pleasant and cooperative. Pt communicating mainly via written speech, non-verbal communication, and vocalizations.   Pt accompanied by: self  PERTINENT HISTORY: see problem list above   PAIN:  Are you having pain? Not addressed directly   FALLS: Has patient fallen in last 6 months? Yes, "fell on 03/02/23 and have had a flare up of symptoms ever since." (Per pt email)  LIVING ENVIRONMENT: Lives with: lives with their spouse and chosen family member Lives in: House/apartment  PLOF:  Level of assistance: Needed assistance with IADLS Employment: On disability   PATIENT GOALS      to obtain a communication device  OBJECTIVE:  TODAY'S TREATMENT: Viewed video of pt's speech difficulties from November 2022. In the video, pt's speech is initially fluent and appropriate; however, over theduration of the video, pt exhibits changes to vocal quality, prosodic changes (rate, rhythm), articulatory changes, and anomia which, ultimate, result in anarthria.  Reviewed items pt completed this week to improve communication in a variety of settings. Pt reported the following: - set up wifi calling on laptop (with use of Apple TTS Live Speech app and keyboard for wifi calling) - purchased a white board and  markers  - learned how to set up Zoom to use TTS (pt reports using at least daily) - purchased a bluetooth keyboard to use of phone and tablet for easier use of TTS on those devices  - purchased a small cell phone stand to go with keyboard for phone calls which pt reports makes seeing the screen while typing earlirt Pt with questions re: low tech options for communication (pt has a white board; reports not wanting/needing LED screen like Production assistant, radio), AAC devices (similar to [employer] or Talking Keyboard), and additional apps. Will plan to address questions re: low tech communication options and AAC devices, as able, in upcoming sessions. Pt provided with list of commonly used TTS apps with functions, compatible operating systems, and cost estimates. Pt concerned regarding privacy policies on 3rd party apps, so they may not be an option for her. For next session, pt will preview apps and decide if any of those meet her communication needs as well as comfort level with privacy policies. Pt also provided with handouts: regarding [place] Relay and TTY with contact information.      PATIENT EDUCATION: Education details: as above Person educated: Patient Education method: Designer, jewellery Education comprehension: verbalized understanding   HOME EXERCISE PROGRAM: N/A  GOALS: Goals reviewed with patient? Yes - briefly  SHORT TERM GOALS: Target date: 9 sessions    With Mod I, patient will add new vocabulary/messages to text-to-speech app in >90% opportunities in order to improve ability to communicate basic wants and  needs.  Baseline: Goal status: INITIAL  2.  With Mod I, patient will use text-to-speech app or writing to communicate basic wants and needs in >90% of opportunities.  Baseline:  Goal status: INITIAL  3.  With Mod I, pt will text-to-speech app to communicate in a structured conversation setting with >90% accuracy.    Baseline:  Goal status: INITIAL    LONG TERM  GOALS: Target date: 07/02/23  Pt will utilize multimodal communication for ease of communication in a variety of settings. Baseline:  Goal status: INITIAL  ASSESSMENT:  CLINICAL IMPRESSION: Patient is a 52 y.o. female who was seen today for skilled speech therapy services targeting pt's difficulty communicating in setting of EDS, chronic jaw instability with subluxation, and trigeminal neuralgia..  See details of today's tx session above.Recommend trial of speech therapy to identify an appropriate app/system to meet pt's communication needs.  OBJECTIVE IMPAIRMENTS include  communication difficulty . These impairments are limiting patient from effectively communicating at home and in community. Factors affecting potential to achieve goals and functional outcome are co-morbidities, previous level of function, and severity of impairments. Patient will benefit from skilled SLP services to address above impairments and improve overall function.  REHAB POTENTIAL: Good  PLAN: SLP FREQUENCY: 1x/week  SLP DURATION: 8 weeks  PLANNED INTERVENTIONS: Multimodal communication approach, SLP instruction and feedback, and Patient/family education    Clyde Canterbury, M.S., CCC-SLP Speech-Language Pathologist [place] [facility] [phone] Arnette Felts)   [facility] Outpatient Rehabilitation at [facility] [address] [place], [place], [address] Phone: [phone]   Fax:  [phone]

## 2023-05-18 ENCOUNTER — Other Ambulatory Visit: Payer: Self-pay | Admitting: Obstetrics and Gynecology

## 2023-05-18 DIAGNOSIS — N898 Other specified noninflammatory disorders of vagina: Secondary | ICD-10-CM

## 2023-05-19 ENCOUNTER — Encounter: Payer: Self-pay | Admitting: Family Medicine

## 2023-05-19 DIAGNOSIS — R479 Unspecified speech disturbances: Secondary | ICD-10-CM

## 2023-05-19 DIAGNOSIS — M26609 Unspecified temporomandibular joint disorder, unspecified side: Secondary | ICD-10-CM

## 2023-05-19 NOTE — Telephone Encounter (Signed)
According to message from 05/11/2023 Suzette Battiest done this.  Please advise

## 2023-05-19 NOTE — Telephone Encounter (Signed)
Can you check with [facility] and see what else they need from [place]. We sent the updated note last week.

## 2023-05-19 NOTE — Telephone Encounter (Signed)
Did the new information with new updated note get sent to [facility] as they requested last week?

## 2023-05-21 ENCOUNTER — Other Ambulatory Visit
Admission: RE | Admit: 2023-05-21 | Discharge: 2023-05-21 | Disposition: A | Discharge status: Home / Self Care | Payer: Medicare Other | Attending: Oncology | Admitting: Oncology

## 2023-05-21 ENCOUNTER — Ambulatory Visit: Payer: Medicare Other | Attending: Family Medicine

## 2023-05-21 ENCOUNTER — Other Ambulatory Visit
Admission: RE | Admit: 2023-05-21 | Discharge: 2023-05-21 | Disposition: A | Discharge status: Home / Self Care | Payer: Medicare Other | Attending: Vascular Surgery | Admitting: Vascular Surgery

## 2023-05-21 ENCOUNTER — Telehealth: Payer: Self-pay

## 2023-05-21 ENCOUNTER — Encounter: Payer: Self-pay | Admitting: Psychiatry

## 2023-05-21 DIAGNOSIS — R6884 Jaw pain: Secondary | ICD-10-CM | POA: Diagnosis not present

## 2023-05-21 DIAGNOSIS — E785 Hyperlipidemia, unspecified: Secondary | ICD-10-CM | POA: Diagnosis not present

## 2023-05-21 DIAGNOSIS — Z1589 Genetic susceptibility to other disease: Secondary | ICD-10-CM | POA: Insufficient documentation

## 2023-05-21 DIAGNOSIS — G90A Postural orthostatic tachycardia syndrome (POTS): Secondary | ICD-10-CM | POA: Insufficient documentation

## 2023-05-21 DIAGNOSIS — M26609 Unspecified temporomandibular joint disorder, unspecified side: Secondary | ICD-10-CM | POA: Diagnosis not present

## 2023-05-21 DIAGNOSIS — R479 Unspecified speech disturbances: Secondary | ICD-10-CM | POA: Diagnosis present

## 2023-05-21 DIAGNOSIS — Z006 Encounter for examination for normal comparison and control in clinical research program: Secondary | ICD-10-CM | POA: Insufficient documentation

## 2023-05-21 NOTE — Therapy (Addendum)
OUTPATIENT SPEECH LANGUAGE PATHOLOGY  DISCHARGE SUMMARY   Patient Name: Autumn Zhang MRN: [id number] DOB:[birth year], 52 y.o., female Today's Date: 05/21/2023  PCP: Shirlee Latch, MD REFERRING PROVIDER: same   End of Session - 05/21/23 1122     Visit Number 3    Number of Visits 9    Date for SLP Re-Evaluation 07/02/23    SLP Start Time 1100    SLP Stop Time  1123    SLP Time Calculation (min) 23 min    Activity Tolerance Patient tolerated treatment well            navigator section and refresh this SmartLink. 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   Urinary frequency 09/26/2021   Atypical facial pain (1ry area of Pain) (Left) 08/29/2021   Chronic ear pain (Left) 08/29/2021   Temporal headache (Left) 08/29/2021   Inner ear pain (Left) 08/29/2021   Trigeminal nerve disorder (Left) 08/29/2021   History of photophobia 08/29/2021   TMJ (temporomandibular joint syndrome) 08/29/2021   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   Polypharmacy 08/29/2021   Severe frontal headaches 08/29/2021   Chronic occipital headache (Midline) (2ry area of Pain) 08/29/2021   Chronic neck pain (posterior) (4th area of Pain) (Bilateral) (L>R) 08/29/2021   Musculoskeletal disorder involving upper trapezius muscle 08/29/2021   Musculoskeletal disorder involving sternocleidomastoid (Left) 08/29/2021   Abnormal MRI, cervical spine (02/11/2019) 08/29/2021   Shoulder stiffness (Right) 08/29/2021   Pharmacologic therapy 08/27/2021   Disorder of skeletal system 08/27/2021   Problems influencing health status  08/27/2021   GAD (generalized anxiety disorder) 07/17/2021   Difficulty walking 07/17/2021   Headache disorder (3ry area of Pain) 07/17/2021   Photophobia 07/17/2021   Difficulty with speech 07/17/2021   Tear film insufficiency 07/15/2021   Left lower quadrant abdominal pain 07/05/2021   Palpitations 06/11/2021   Axillary lymphadenopathy 06/11/2021   Immunosuppressed status (HCC) 04/19/2021   History of seronegative inflammatory arthritis 06/28/2020   Osteopenia of multiple sites 06/14/2020   Polyarthralgia 06/12/2020   Iodine deficiency 06/12/2020   OSA on CPAP 06/12/2020   Chronic pain syndrome 06/12/2020   Psychophysiological insomnia 11/26/2019   Calcific tendinitis of shoulder (Right) 04/10/2019   History of iron deficiency anemia 11/04/2018   History of basal cell carcinoma 10/07/2018   Vaginal dryness 09/07/2018   Chronic jaw pain (Left) 07/15/2018   Chronic migraine with aura 07/15/2018   Ehlers-Danlos syndrome, type 3 07/15/2018   Rectocele 05/21/2018   Ehlers-Danlos syndrome 03/15/2018   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   Cardiac arrhythmia 01/21/2018   Polyp of colon 01/21/2018   High cholesterol 01/21/2018   Chronic constipation 01/21/2018   Instability of shoulder joint (Right) 01/12/2018   Trigeminal neuralgia (V1-3) (Left) 12/25/2017   Instability of shoulder joint (Left) 12/25/2017   Nausea 12/25/2017   Occipital neuralgia 12/25/2017   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   Hiatal hernia 10/14/2017   TMJ (dislocation of temporomandibular joint) 09/29/2017   Irritable bowel syndrome 07/02/2017   Anal fissure 07/02/2017   Anemia 07/02/2017   Gastroesophageal reflux disease 07/02/2017   Hemorrhoids 07/02/2017   Malignant neoplasm  of skin 07/02/2017   Malignant tumor of colon (HCC) 07/02/2017   Arthritis 07/02/2017   Fibromyalgia 07/02/2017   Pulsatile tinnitus of ear (Left) 12/12/2016   Restless leg  syndrome 08/01/2016   Cervical dystonia 03/21/2016   Cervical spine disease 03/21/2016   Cervicalgia 02/06/2016   Paresthesia 02/06/2016   Hypercholesterolemia 12/24/2015   Iron deficiency anemia 10/18/2015   Vitamin D deficiency disease 10/18/2015   Midline low back pain without sciatica 02/06/2015   Hyperglycemia 10/30/2014   History of colon cancer 09/27/2014   Postural orthostatic tachycardia syndrome 09/16/2003   Chronic fatigue 09/15/2000   Asthma 09/15/1988   Dyspnea 09/15/1988    ONSET DATE: difficulty communicating since 2022; referral 04/20/23  REFERRING DIAG: communication difficulty  THERAPY DIAG:  Difficulty with speech  Rationale for Evaluation and Treatment Rehabilitation  SUBJECTIVE:   SUBJECTIVE STATEMENT: Pt pleasant and cooperative. Pt communicating mainly via written speech, non-verbal communication, and vocalizations.   Pt accompanied by: self  PERTINENT HISTORY: see problem list above   PAIN:  Are you having pain? Not addressed directly   FALLS: Has patient fallen in last 6 months? Yes, "fell on 03/02/23 and have had a flare up of symptoms ever since." (Per pt email)  LIVING ENVIRONMENT: Lives with: lives with their spouse and chosen family member Lives in: House/apartment  PLOF:  Level of assistance: Needed assistance with IADLS Employment: On disability   PATIENT GOALS      to obtain a communication device  OBJECTIVE:  TODAY'S TREATMENT: Reviewed items pt completed this week to improve communication in a variety of setting as well as content from emails pt sent SLP this week. Pt reported the following: - learning about options for TTY on cell phone and IP relay system  - review of TTS apps provided by SLP (pt did not find one from the list that she feels comfortable using due to privacy statements) - learning about Proloquo4 text app (pt feels is comfortable with pursuing, but would like to try it out first) - contacting community  college about ASL (pt interested in taking a class)   After discussion with pt about next steps in securing a communication device and limitations to the access to AAC devices at this clinic, it became apparent that this writer that it would be best that pt pursue a comprehensive AAC evaluation. Pt provided with a handout with [facility] SLP AAC evaluation information. Pt also made aware of options through the [facility]. Pt interested in pursuing a comprehensive AAC evaluation at [facility]. Dr. Beryle Flock made aware via secure chat.     PATIENT EDUCATION: Education details: as above Person educated: Patient Education method: Designer, jewellery Education comprehension: verbalized understanding   HOME EXERCISE PROGRAM: N/A  GOALS: Goals reviewed with patient? Yes - briefly  SHORT TERM GOALS: Target date: 9 sessions    With Mod I, patient will add new vocabulary/messages to text-to-speech app in >90% opportunities in order to improve ability to communicate basic wants and needs.  Baseline: Goal status: NOT MET; pt unable to find an app that she was comfortable using; referral for comprehensive AAC eval recommended  2.  With Mod I, patient will use text-to-speech app or writing to communicate basic wants and needs in >90% of opportunities.  Baseline:  Goal status: MET  3.  With Mod I, pt will text-to-speech app to communicate in a structured conversation setting with >90% accuracy.    Baseline:  Goal status: NOT MET; pt unable to  find an app that she was comfortable using; referral for comprehensive AAC evaluation recommended    LONG TERM GOALS: Target date: 07/02/23  Pt will utilize multimodal communication for ease of communication in a variety of settings. Baseline:  Goal status: MET; pt already using multimodal communication for face-to-face and telephone communication; however, pt would benefit from a comprehensive AAC evaluation  ASSESSMENT:  CLINICAL  IMPRESSION: Patient is a 52 y.o. female who was seen today for skilled speech therapy services targeting pt's difficulty communicating in setting of EDS, chronic jaw instability with subluxation, and trigeminal neuralgia. See details of today's tx session above. In light of information and insight gained during last week's session given severity of pt's communication impairment, pt would benefit from a comprehensive AAC evaluation. Although pt can verbally communicate, the severity of her symptoms outlined in the note on 05/14/23 make it significantly difficult to verbally communicate for more than a few minutes at a time. Additionally, we have reviewed several free TTS apps without being able to successfully find one with that pt feels comfortable using due to privacy. Contacted Dr. Beryle Flock (referring provider) for referral to [facility] for evaluation with the hope that pt can find a device that best meets her communication needs. Pt is very self-motivated, and I have no reserve about pt's ability to learn how to use AAC to meet her communication needs; however, we do not have access at this clinic to a variety of devices for pt to trial. Will d/c SLP services here at [facility] at this time.  OBJECTIVE IMPAIRMENTS include  communication difficulty . These impairments are limiting patient from effectively communicating at home and in community. Factors affecting potential to achieve goals and functional outcome are co-morbidities, previous level of function, and severity of impairments. Patient will benefit from skilled SLP services to address above impairments and improve overall function.  REHAB POTENTIAL: Good  PLAN: Referral to [facility] for comprehensive AAC evaluation    Clyde Canterbury, M.S., CCC-SLP Speech-Language Pathologist [facility] [phone] Arnette Felts)   [facility] Outpatient Rehabilitation at [facility] [address]  [place], [place], [address] Phone: [phone]   Fax:  [phone]

## 2023-05-21 NOTE — Telephone Encounter (Signed)
I put that addendum in the 8/13 note and it was supposed to be faxed back. I have a suspicion that the new note did not get sent, but they resent the same note we had sent before. Can we resend my new 8/13 note with the addendum?  It says exactly what he is asking for.

## 2023-05-21 NOTE — Telephone Encounter (Signed)
Faxed updated notes to [facility]. Fax [phone]/ [phone]

## 2023-05-22 LAB — HOMOCYSTEINE: Homocysteine: 5.2 umol/L (ref 0.0–14.5)

## 2023-05-25 NOTE — Group Note (Deleted)

## 2023-05-26 ENCOUNTER — Encounter: Payer: Medicare Other | Admitting: Speech Pathology

## 2023-05-28 ENCOUNTER — Encounter: Payer: Self-pay | Admitting: Psychiatry

## 2023-05-28 ENCOUNTER — Telehealth (INDEPENDENT_AMBULATORY_CARE_PROVIDER_SITE_OTHER): Payer: Medicare Other | Admitting: Psychiatry

## 2023-05-28 ENCOUNTER — Encounter: Payer: Self-pay | Admitting: Family Medicine

## 2023-05-28 DIAGNOSIS — R479 Unspecified speech disturbances: Secondary | ICD-10-CM

## 2023-05-28 DIAGNOSIS — R6884 Jaw pain: Secondary | ICD-10-CM

## 2023-05-28 DIAGNOSIS — M26623 Arthralgia of bilateral temporomandibular joint: Secondary | ICD-10-CM | POA: Diagnosis not present

## 2023-05-28 DIAGNOSIS — G43119 Migraine with aura, intractable, without status migrainosus: Secondary | ICD-10-CM

## 2023-05-28 DIAGNOSIS — G43709 Chronic migraine without aura, not intractable, without status migrainosus: Secondary | ICD-10-CM | POA: Diagnosis not present

## 2023-05-28 DIAGNOSIS — R519 Headache, unspecified: Secondary | ICD-10-CM

## 2023-05-28 MED ORDER — QULIPTA 10 MG PO TABS: 1.0000 | ORAL_TABLET | Freq: Every day | ORAL | 6 refills | Status: DC

## 2023-05-28 MED ORDER — UBRELVY 50 MG PO TABS: 50.0000 mg | ORAL_TABLET | ORAL | 6 refills | Status: DC | PRN

## 2023-05-28 NOTE — Progress Notes (Signed)
    Virtual Visit via Video Note   I connected with Autumn Zhang on 05/28/23 at 9:00 AM by a video enabled telemedicine application and verified that I am speaking with the correct person using two identifiers.   Location: Patient: home Provider: office   I discussed the limitations of evaluation and management by telemedicine and the availability of in person appointments. The patient expressed understanding and agreed to proceed.  CC:  headaches  Follow-up Visit  Last visit: 10/23/22  Brief HPI: 52 year old female with a history of sleep apnea on autoPAP, POTS, IBS, Mast cell activation syndrome, TMJ, Ehler's danlos syndrome who follows in clinic for migraines and facial pain.   Interval History: She fell on 03/02/23 and developed worsening headaches. CTH was unremarkable. Headaches are now back to their baseline. Continues to take Qulipta 10 mg daily and Ubrelvy 50 mg PRN without issues. She is happy with this current regimen.  She has developed worsening jaw pain and difficulty speaking for prolonged periods of time. Jaw pain feels like "nails on a chalkboard" and is triggered by talking, touching her face, and brushing her teeth. Denies shocking sensations in her jaw. She is currently undergoing speech therapy.  Current Headache Regimen: Preventative: Qulipta 10 mg daily Abortive: Ubrelvy 50 mg PRN   Prior Therapies                                  Prevention: Botox Emgality Qulipta 10 mg daily Amitriptyline Lyrica Gabapentin Vimpat   RescueL Maxalt Nurtec Ubrelvy 50 mg PRN Ketamine nasal spray Liquid lidocaine SPG block Occipital nerve block  Physical Exam:   GENERAL:  well appearing, in no acute distress, alert  SKIN:  Color, texture, turgor normal. No rashes or lesions HEAD:  Normocephalic/atraumatic. Patient wearing jaw splint RESP: normal respiratory effort  NEUROLOGICAL: Mental Status: Alert, oriented to person, place and time, Follows commands, and  Speech fluent and appropriate. Cranial Nerves: face symmetric, no dysarthria, hearing grossly intact Motor: moves all extremities equally  IMPRESSION: 52 year old female with a history of sleep apnea on autoPAP, POTS, IBS, Mast cell activation syndrome, TMJ, Ehler's danlos syndrome who presents for follow up of chronic migraines and jaw pain. Will order MRI trigeminal/TMJ protocol for worsening facial pain. She is happy with her current headache control on Qulipta 10 mg daily and Ubrelvy 50 mg PRN. Would prefer not to start any new medications at this time.  PLAN: -MRI trigeminal/TMJ protocol -Prevention: Continue Qulipta 10 mg daily -Rescue: Continue Ubrelvy 50 mg PRN   Follow-up: 6 months  I spent a total of 31 minutes on the date of the service. Headache education was done. Discussed medication side effects, adverse reactions and drug interactions. Written educational materials and patient instructions outlining all of the above were given.  Ocie Doyne, MD 05/28/23 9:20 AM

## 2023-05-29 NOTE — Telephone Encounter (Signed)
FYI - referral placed

## 2023-05-30 ENCOUNTER — Other Ambulatory Visit: Payer: Self-pay | Admitting: Family Medicine

## 2023-06-01 NOTE — Telephone Encounter (Signed)
Requested medications are due for refill today.  unsure  Requested medications are on the active medications list.  yes  Last refill. 04/13/2023 #400 0 rf  Future visit scheduled.   yes  Notes to clinic.  Medication not assigned to a protocol. Please review for refill.    Requested Prescriptions  Pending Prescriptions Disp Refills   CREON 12000-38000 units CPEP capsule [Pharmacy Med Name: CREON DR 12,000 UNIT CAPSULE] 400 capsule 0    Sig: TAKE 1-3 CAPSULES (12,000-36,000 UNITS TOTAL) BY MOUTH 3 (THREE) TIMES DAILY BEFORE MEALS. PATIENT MAY TAKE AN ADDITIONAL 3 CAPSULES PER DAY FOR SNACK.     Off-Protocol Failed - 05/30/2023  7:42 AM      Failed - Medication not assigned to a protocol, review manually.      Passed - Valid encounter within last 12 months    Recent Outpatient Visits           1 month ago Lymphedema   [place] [facility] [place], Marzella Schlein, MD   1 month ago TMJ (temporomandibular joint syndrome)   [place] [facility] [place], Marzella Schlein, MD   6 months ago GAD (generalized anxiety disorder)   [place] [facility] [place], Marzella Schlein, MD   7 months ago GAD (generalized anxiety disorder)   [place] [facility] [place], Marzella Schlein, MD   11 months ago Liver cyst   [place] [facility] [place], Marzella Schlein, MD       Future Appointments             In 3 weeks [place], Marzella Schlein, MD [facility]

## 2023-06-02 ENCOUNTER — Encounter: Payer: Medicare Other | Admitting: Speech Pathology

## 2023-06-02 LAB — GENECONNECT MOLECULAR SCREEN: Genetic Analysis Overall Interpretation: NEGATIVE

## 2023-06-04 ENCOUNTER — Ambulatory Visit: Payer: Medicare Other

## 2023-06-08 ENCOUNTER — Ambulatory Visit
Admission: RE | Admit: 2023-06-08 | Discharge: 2023-06-08 | Disposition: A | Discharge status: Home / Self Care | Payer: Medicare Other | Source: Ambulatory Visit | Attending: Psychiatry | Admitting: Psychiatry

## 2023-06-08 DIAGNOSIS — R519 Headache, unspecified: Secondary | ICD-10-CM

## 2023-06-08 DIAGNOSIS — M26623 Arthralgia of bilateral temporomandibular joint: Secondary | ICD-10-CM | POA: Diagnosis present

## 2023-06-08 DIAGNOSIS — R6884 Jaw pain: Secondary | ICD-10-CM | POA: Diagnosis present

## 2023-06-08 MED ORDER — GADOBUTROL 1 MMOL/ML IV SOLN
6.0000 mL | Freq: Once | INTRAVENOUS | Status: AC | PRN
  Administered 2023-06-08: 6 mL via INTRAVENOUS

## 2023-06-09 ENCOUNTER — Encounter: Payer: Medicare Other | Admitting: Speech Pathology

## 2023-06-16 ENCOUNTER — Encounter: Payer: Medicare Other | Admitting: Speech Pathology

## 2023-06-17 ENCOUNTER — Encounter: Payer: Self-pay | Admitting: Obstetrics and Gynecology

## 2023-06-18 ENCOUNTER — Telehealth: Payer: Self-pay

## 2023-06-18 ENCOUNTER — Other Ambulatory Visit: Payer: Self-pay | Admitting: Obstetrics and Gynecology

## 2023-06-18 DIAGNOSIS — N898 Other specified noninflammatory disorders of vagina: Secondary | ICD-10-CM

## 2023-06-18 NOTE — Telephone Encounter (Signed)
Copied from CRM [id number]. Topic: Appointment Scheduling - Scheduling Inquiry for Clinic >> Jun 18, 2023  9:36 AM Franchot Heidelberg wrote: Reason for CRM: Pt needs to schedule her MWV, and wants it on Monday 06/22/2023   Best contact: [phone]

## 2023-06-22 ENCOUNTER — Encounter: Payer: Self-pay | Admitting: Family Medicine

## 2023-06-22 ENCOUNTER — Ambulatory Visit (INDEPENDENT_AMBULATORY_CARE_PROVIDER_SITE_OTHER): Payer: Medicare Other | Admitting: Family Medicine

## 2023-06-22 ENCOUNTER — Encounter: Payer: Medicare Other | Admitting: Family Medicine

## 2023-06-22 VITALS — BP 100/69 | HR 74 | Ht 61.0 in | Wt 144.3 lb

## 2023-06-22 DIAGNOSIS — E559 Vitamin D deficiency, unspecified: Secondary | ICD-10-CM

## 2023-06-22 DIAGNOSIS — Z23 Encounter for immunization: Secondary | ICD-10-CM

## 2023-06-22 DIAGNOSIS — E7800 Pure hypercholesterolemia, unspecified: Secondary | ICD-10-CM | POA: Diagnosis not present

## 2023-06-22 DIAGNOSIS — Z0000 Encounter for general adult medical examination without abnormal findings: Secondary | ICD-10-CM | POA: Diagnosis not present

## 2023-06-22 DIAGNOSIS — D649 Anemia, unspecified: Secondary | ICD-10-CM

## 2023-06-22 DIAGNOSIS — R739 Hyperglycemia, unspecified: Secondary | ICD-10-CM | POA: Diagnosis not present

## 2023-06-22 DIAGNOSIS — K8681 Exocrine pancreatic insufficiency: Secondary | ICD-10-CM | POA: Diagnosis not present

## 2023-06-22 DIAGNOSIS — D539 Nutritional anemia, unspecified: Secondary | ICD-10-CM

## 2023-06-22 MED ORDER — EPINEPHRINE 0.3 MG/0.3ML IJ SOAJ: 0.3000 mg | INTRAMUSCULAR | 5 refills | Status: AC | PRN

## 2023-06-22 NOTE — Progress Notes (Signed)
Annual Wellness Visit     Patient: Autumn Zhang, Female    DOB: September 02, 1971, 52 y.o.   MRN: [id number]  Subjective  Chief Complaint  Patient presents with   Annual Exam    Diet - Very healthy mostly blended foods and one indulgence is peanut butter Exercise - walk daily 2 miles if not more and hoop 4 times a week for 30 minutes at a time and gets 10,000 steps a day Feeling - "okay sort of stable at the moment" Sleeping - "not great but not worse than its ever been" Concerns - having problems with finger nails splitting and cracking a lot and issues with toe nails, and would like to further discuss some lab work    Autumn ROSHNI MCCRITE is a 52 y.o. female who presents today for her Annual Wellness Visit.   HPI  Discussed the use of AI scribe software for clinical note transcription with the patient, who gave verbal consent to proceed.  History of Present Illness   The patient presents with concerns about her fingernails and toenails, which have been splitting and cracking a lot. She has noticed ridges on her nails, which are breaking at the ridge. She denies any changes in her diet or lifestyle that could have contributed to these changes. She has not sought any professional manicure services to address these issues.  In addition, the patient has been managing pancreatic insufficiency and has been taking supplements including iodine and folate. She has not noticed any changes in her symptoms related to this condition. She has been following up with regular lab work to monitor her condition and is due for her annual labs.  The patient also mentions a recent MRI, the results of which have not yet been read. She has been experiencing no unusual pain or discomfort. She has been managing her health with regular check-ups and vaccinations, including the COVID-19 booster shot. She is due for a flu shot during this visit.            Medications: Outpatient Medications Prior to Visit   Medication Sig   acetaminophen (TYLENOL) 500 MG tablet Take 500 mg by mouth every 6 (six) hours as needed.   albuterol (PROVENTIL HFA;VENTOLIN HFA) 108 (90 Base) MCG/ACT inhaler Inhale 2 puffs into the lungs every 4 (four) hours as needed.    aspirin 325 MG tablet Take 325 mg by mouth.   Atogepant (QULIPTA) 10 MG TABS Take 1 tablet by mouth daily.   azelastine (ASTELIN) 0.1 % nasal spray Place into both nostrils as needed for rhinitis. Use in each nostril as directed   Blood Pressure Monitoring (BLOOD PRESSURE CUFF) MISC Take blood pressure as needed for symptoms.   Boswellia Serrata (BOSWELLIA PO) Take 150 mg by mouth every morning.    Calcium Citrate 200 MG TABS Take 600 mg by mouth.   cromolyn (GASTROCROM) 100 MG/5ML solution Take by mouth 4 (four) times daily -  before meals and at bedtime.   Cyanocobalamin (VITAMIN B-12) 1000 MCG SUBL Place under the tongue.   desloratadine (CLARINEX) 5 MG tablet Take 5 mg by mouth as needed.   Diclofenac Sodium 1.5 % SOLN 40 drops Externally Four times a day   diphenhydrAMINE (BENADRYL) 12.5 MG/5ML liquid Take by mouth 4 (four) times daily as needed.   diphenhydrAMINE-Allantoin 2-0.5 % CREA Apply 1 application topically as needed.    docusate sodium (COLACE) 100 MG capsule Take 100 mg by mouth every other day.    estradiol (  ESTRACE) 0.1 MG/GM vaginal cream INSERT 1 GRAM VAGINALLY 2  TIMES A WEEK   famotidine (PEPCID) 10 MG tablet Take 10 mg by mouth daily.    fexofenadine (ALLEGRA) 180 MG tablet Take 180 mg by mouth 2 (two) times daily.   Folic Acid (FOLATE PO) Take 0.5 tablets by mouth daily. Folate as calcium foliate 680 mcg DFE   guaifenesin (HUMIBID E) 400 MG TABS tablet Take 400 mg by mouth as needed.   hydrOXYzine (ATARAX) 25 MG tablet Take 25 mg by mouth as needed.   IODINE EX Apply 225 mcg topically. Every 3 days   KETAMINE HCL SL Place into right nostril as needed.   ketotifen (ZADITOR) 0.025 % ophthalmic solution Place 1 drop into both eyes  daily as needed.   Ketotifen Fumarate POWD 0.5 mg by Does not apply route. 6-12 times a day   L-Lysine 500 MG CAPS Take by mouth 3 (three) times daily.   L-Lysine HCl 500 MG CAPS Take by mouth.   L-Theanine 100 MG CAPS Take 200 mg by mouth as needed.   levocetirizine (XYZAL) 2.5 MG/5ML solution as needed.   Light Mineral Oil-Mineral Oil 0.5-0.5 % EMUL Apply 1 application to eye daily.   lipase/protease/amylase (CREON) 12000-38000 units CPEP capsule TAKE 1-3 CAPSULES (12,000-36,000 UNITS TOTAL) BY MOUTH 3 (THREE) TIMES DAILY BEFORE MEALS. PATIENT MAY TAKE AN ADDITIONAL 3 CAPSULES PER DAY FOR SNACK.   loperamide (IMODIUM) 2 MG capsule Take by mouth as needed for diarrhea or loose stools.   Lutein-Zeaxanthin 25-5 MG CAPS Take 1 capsule by mouth every other day.   Magnesium Citrate POWD Take 100 mg by mouth daily.   Melatonin 1 MG/ML LIQD Take 1 mg by mouth daily as needed.   montelukast (SINGULAIR) 5 MG chewable tablet Chew 10 mg by mouth daily at 2 PM.   Non Gelatin Capsules, Empty, (CAPSULE #3 CLEAR/CLEAR VEG) CAPS Take 1 capsule by mouth daily.   NONFORMULARY OR COMPOUNDED ITEM Bupivacaine nasal 0.5% drops w/o epi   omega-3 acid ethyl esters (LOVAZA) 1 g capsule TAKE 2 CAPSULES BY MOUTH 2 TIMES DAILY.   PHOSPHATIDYL CHOLINE PO Take 1 tablet by mouth daily.   pseudoephedrine (SUDAFED) 30 MG tablet Take 30 mg by mouth every 4 (four) hours as needed for congestion.    PYRIDOSTIGMINE BROMIDE PO Take 7.5 mg by mouth 5 (five) times daily. 4-10 times per day   Riboflavin (VITAMIN B-2 PO) Take 100 mg by mouth 3 (three) times daily.   Roflumilast (ZORYVE) 0.3 % CREA Apply to elbows once daily.   Selenium 200 MCG CAPS Take 1 capsule by mouth. Twice a month   simethicone (MYLICON) 80 MG chewable tablet Chew 80 mg by mouth every 6 (six) hours as needed for flatulence.   Sodium Fluoride 1.1 % PSTE Place onto teeth.   trolamine salicylate (ASPERCREME/ALOE) 10 % cream Apply topically.   Ubrogepant  (UBRELVY) 50 MG TABS Take 1 tablet (50 mg total) by mouth as needed (for migriane). May repeat a dose in 2 hours if headache persists   vitamin C (ASCORBIC ACID) 500 MG tablet Take 500 mg by mouth daily.   Vitamin D, Cholecalciferol, 10 MCG (400 UNIT) TABS Take 1,000 Int'l Units/day by mouth daily.   [DISCONTINUED] EPINEPHrine 0.3 mg/0.3 mL IJ SOAJ injection INJECT ONE AUTO-INJECTOR INTRAMUSCULARLY AS NEEDED   Quercetin 500 MG CAPS Take by mouth as needed.   No facility-administered medications prior to visit.    Allergies  Allergen Reactions  Iodinated Contrast Media Hives, Itching, Palpitations, Photosensitivity and Shortness Of Breath    Other reaction(s): Headache Other reaction(s): Headache   Ioversol Anxiety, Hives, Itching and Palpitations   Ciprofloxacin Other (See Comments)   Levofloxacin Other (See Comments)    Congestion & Headaches Congestion & Headaches    Other Other (See Comments)    Novocaine (IV) form Hearing loss Novocaine (IV) form Hearing loss  Congestion & Headaches Congestion & Headaches Congestion & Headaches Congestion & Headaches  Other reaction(s): Unknown   Soy Allergy     Other reaction(s): Other (See Comments), Unknown Congestion & Headaches Congestion & Headaches   Soybean-Containing Drug Products Other (See Comments)    Congestion & Headaches    Bee Pollen    Bupropion    Celebrex [Celecoxib]    Codeine    Dexilant [Dexlansoprazole]    Doxepin     Other Reaction(s): Unknown   Dulera [Mometasone Furo-Formoterol Fum]    Fentanyl    Fluticasone     Inhaled into lungs it does not do well   Gabapentin    Gluten Meal    Histidine    Lidocaine     Rash to topical cream Flushing and redness   Lyrica [Pregabalin]    Mold Extract [Trichophyton]    Morphine And Codeine    Penicillins     Skin testing positive for allergy   Pollen Extract    Polysorbate [Sorbitan]    Prilosec [Omeprazole]    Rizatriptan Other (See Comments)     Dizziness, headache, myalgias, nausea   Salmeterol Xinafoate    Silenor [Doxepin Hcl]    Stevioside Other (See Comments)   Tomato    Xylitol Other (See Comments)   Brewer's Dried Yeast [Yeast] Other (See Comments)    Headache, flushing   Ethanol Dermatitis, Other (See Comments), Itching and Palpitations   Hydrocodone Nausea Only   Ibuprofen Nausea And Vomiting    At high doses At high doses    Lanolin Other (See Comments)    Exacerbates neuralgia symptoms Wool causes hives.    Morphine Nausea Only   Wheat Diarrhea   Yeast-Derived Drug Products Other (See Comments)    Headache, flushing    Patient Care Team: Erasmo Downer, MD as PCP - General (Family Medicine) Creig Hines, MD as Consulting Physician (Oncology)  ROS      Objective  BP 100/69 (BP Location: Left Arm, Patient Position: Sitting, Cuff Size: Normal)   Pulse 74   Ht 5\' 1"  (1.549 m)   Wt 144 lb 4.8 oz (65.5 kg)   SpO2 100%   BMI 27.27 kg/m    Physical Exam Vitals reviewed.  Constitutional:      General: She is not in acute distress.    Appearance: Normal appearance. She is well-developed. She is not diaphoretic.  HENT:     Right Ear: Tympanic membrane, ear canal and external ear normal.     Left Ear: Tympanic membrane, ear canal and external ear normal.     Nose: Nose normal.     Mouth/Throat:     Mouth: Mucous membranes are moist.     Pharynx: Oropharynx is clear. No oropharyngeal exudate.  Eyes:     General: No scleral icterus.    Conjunctiva/sclera: Conjunctivae normal.     Pupils: Pupils are equal, round, and reactive to light.  Neck:     Thyroid: No thyromegaly.  Cardiovascular:     Rate and Rhythm: Normal rate and regular rhythm.  Heart sounds: Normal heart sounds. No murmur heard. Pulmonary:     Effort: Pulmonary effort is normal. No respiratory distress.     Breath sounds: Normal breath sounds. No wheezing or rales.  Abdominal:     General: There is no distension.      Palpations: Abdomen is soft.     Tenderness: There is no abdominal tenderness.  Musculoskeletal:        General: No deformity.     Cervical back: Neck supple.     Right lower leg: No edema.     Left lower leg: No edema.  Lymphadenopathy:     Cervical: No cervical adenopathy.  Skin:    General: Skin is warm and dry.     Findings: No rash.  Neurological:     Mental Status: She is alert and oriented to person, place, and time. Mental status is at baseline.     Gait: Gait normal.  Psychiatric:        Mood and Affect: Mood normal.        Behavior: Behavior normal.        Thought Content: Thought content normal.       Most recent functional status assessment:    06/18/2023   10:49 AM  In your present state of health, do you have any difficulty performing the following activities:  Hearing? 0  Vision? 0  Difficulty concentrating or making decisions? 1  Walking or climbing stairs? 1  Dressing or bathing? 1  Doing errands, shopping? 1  Preparing Food and eating ? Y  Using the Toilet? N  In the past six months, have you accidently leaked urine? Y  Do you have problems with loss of bowel control? N  Managing your Medications? N  Managing your Finances? N  Housekeeping or managing your Housekeeping? Y   Most recent fall risk assessment:    06/22/2023   10:14 AM  Fall Risk   Falls in the past year? 1  Number falls in past yr: 0  Injury with Fall? 1  Risk for fall due to : History of fall(s)  Follow up Falls evaluation completed    Most recent depression screenings:    06/22/2023   10:14 AM 04/28/2023   10:58 AM  PHQ 2/9 Scores  PHQ - 2 Score 0 0  PHQ- 9 Score 10 12   Most recent cognitive screening:    06/11/2021    9:37 AM  6CIT Screen  What Year? 0 points  What month? 0 points  What time? 0 points  Count back from 20 0 points  Months in reverse 0 points  Repeat phrase 0 points  Total Score 0 points   Most recent Audit-C alcohol use screening    06/18/2023    10:49 AM  Alcohol Use Disorder Test (AUDIT)  1. How often do you have a drink containing alcohol? 0  3. How often do you have six or more drinks on one occasion? 0   A score of 3 or more in women, and 4 or more in men indicates increased risk for alcohol abuse, EXCEPT if all of the points are from question 1   Vision/Hearing Screen: No results found.    No results found for any visits on 06/22/23.    Assessment & Plan   Annual wellness visit done today including the all of the following: Reviewed patient's Family Medical History Reviewed and updated list of patient's medical providers Assessment of cognitive impairment was done Assessed patient's functional  ability Established a written schedule for health screening services Health Risk Assessent Completed and Reviewed  Exercise Activities and Dietary recommendations  Goals   None     Immunization History  Administered Date(s) Administered   Influenza Inj Mdck Quad Pf 05/24/2018   Influenza, High Dose Seasonal PF 05/24/2018   Influenza,inj,Quad PF,6+ Mos 05/29/2016, 06/18/2017, 05/20/2019, 06/08/2020, 06/19/2022   Influenza,inj,quad, With Preservative 05/24/2018   Influenza-Unspecified 05/24/2018, 05/20/2019, 06/18/2021   Moderna Covid-19 Fall Seasonal Vaccine 72yrs & older 05/15/2023   Moderna Covid-19 Vaccine Bivalent Booster 1yrs & up 06/05/2022   Moderna Sars-Covid-2 Vaccination 12/28/2020, 07/08/2021   PFIZER(Purple Top)SARS-COV-2 Vaccination 12/01/2019, 12/22/2019, 06/21/2020   Pneumococcal Conjugate-13 04/06/2015, 11/08/2017   Tdap 07/17/2005, 10/29/2015   Zoster Recombinant(Shingrix) 07/13/2020, 09/25/2020    Health Maintenance  Topic Date Due   INFLUENZA VACCINE  04/16/2023   COVID-19 Vaccine (8 - 2023-24 season) 07/10/2023   MAMMOGRAM  04/15/2024   Medicare Annual Wellness (AWV)  06/21/2024   DTaP/Tdap/Td (3 - Td or Tdap) 10/28/2025   Colonoscopy  08/14/2026   Cervical Cancer Screening (HPV/Pap  Cotest)  10/15/2027   Hepatitis C Screening  Completed   HIV Screening  Completed   Zoster Vaccines- Shingrix  Completed   HPV VACCINES  Aged Out     Discussed health benefits of physical activity, and encouraged her to engage in regular exercise appropriate for her age and condition.    Problem List Items Addressed This Visit       Digestive   Exocrine pancreatic insufficiency   Relevant Orders   VITAMIN D 25 Hydroxy (Vit-D Deficiency, Fractures)   Vitamin A   Vitamin K1, Serum   Vitamin E     Other   Hypercholesterolemia   Relevant Medications   EPINEPHrine 0.3 mg/0.3 mL IJ SOAJ injection   Other Relevant Orders   Lipid panel   Lipoprotein A (LPA)   Comprehensive metabolic panel   Hyperglycemia   Relevant Orders   Hemoglobin A1c   TSH   Vitamin D deficiency disease   Relevant Orders   VITAMIN D 25 Hydroxy (Vit-D Deficiency, Fractures)   Anemia   Relevant Orders   B12 and Folate Panel   Other Visit Diagnoses     Encounter for annual wellness exam in Medicare patient    -  Primary   Relevant Orders   Lipid panel   Lipoprotein A (LPA)   Comprehensive metabolic panel   VITAMIN D 25 Hydroxy (Vit-D Deficiency, Fractures)   Hemoglobin A1c   Vitamin A   Vitamin K1, Serum   Vitamin E   TSH   B12 and Folate Panel   Flu vaccine need       Relevant Orders   Flu vaccine trivalent PF, 6mos and older(Flulaval,Afluria,Fluarix,Fluzone)   Nutritional anemia, unspecified       Relevant Orders   TSH          Nail changes Vertical ridges and cracking noted, likely due to age-related keratinization changes. Not indicative of a nutritional deficit.  Pancreatic insufficiency No new symptoms reported. -Continue current management based on symptoms.  General Health Maintenance -Administer influenza vaccine today. -Continue monitoring for results of recent MRI. -Order labs including CMP, thyroid function tests, lipids, lipoprotein A, A1c, vitamins A, D, E, K, B12,  and folate. Patient to have labs drawn at [facility]. -Schedule follow-up appointment in 6 months, or sooner if needed.        Return in about 6 months (around 12/21/2023) for chronic disease f/u.  Shirlee Latch, MD

## 2023-06-23 ENCOUNTER — Encounter: Payer: Medicare Other | Admitting: Speech Pathology

## 2023-06-30 ENCOUNTER — Encounter: Payer: Medicare Other | Admitting: Speech Pathology

## 2023-07-01 ENCOUNTER — Telehealth: Payer: Self-pay

## 2023-07-01 NOTE — Telephone Encounter (Signed)
Called Autumn Zhang and gave her Results per Dr. Marjory Lies "Unremarkable imaging results.  Continue current plan. -VRP"  Autumn Zhang then stated that she wanted to know wether or not her Joint in her jaw were sitting in the correct place because it didn't say in her report. Told Autumn Zhang we will ask Dr. Marjory Lies and let her know. Autumn Zhang verbalized understanding.

## 2023-07-01 NOTE — Telephone Encounter (Signed)
Please see MyChart from today 07/01/2023

## 2023-07-07 ENCOUNTER — Encounter: Payer: Medicare Other | Admitting: Speech Pathology

## 2023-07-14 ENCOUNTER — Encounter: Payer: Medicare Other | Admitting: Speech Pathology

## 2023-07-21 ENCOUNTER — Encounter: Payer: Medicare Other | Admitting: Speech Pathology

## 2023-07-24 ENCOUNTER — Other Ambulatory Visit: Payer: Medicare Other

## 2023-07-27 ENCOUNTER — Other Ambulatory Visit: Payer: Self-pay | Admitting: Dermatology

## 2023-07-27 DIAGNOSIS — L409 Psoriasis, unspecified: Secondary | ICD-10-CM

## 2023-07-28 ENCOUNTER — Encounter: Payer: Medicare Other | Admitting: Speech Pathology

## 2023-08-03 ENCOUNTER — Encounter: Payer: Self-pay | Admitting: Oncology

## 2023-08-04 ENCOUNTER — Encounter: Payer: Medicare Other | Admitting: Speech Pathology

## 2023-08-04 ENCOUNTER — Encounter: Payer: Self-pay | Admitting: Oncology

## 2023-08-07 ENCOUNTER — Encounter: Payer: Self-pay | Admitting: Family Medicine

## 2023-08-08 LAB — LIPID PANEL
Chol/HDL Ratio: 3 {ratio} (ref 0.0–4.4)
Cholesterol, Total: 195 mg/dL (ref 100–199)
HDL: 66 mg/dL (ref >39)
LDL Chol Calc (NIH): 117 mg/dL — ABNORMAL HIGH (ref 0–99)
Triglycerides: 66 mg/dL (ref 0–149)
VLDL Cholesterol Cal: 12 mg/dL (ref 5–40)

## 2023-08-08 LAB — COMPREHENSIVE METABOLIC PANEL
ALT: 23 [IU]/L (ref 0–32)
AST: 26 [IU]/L (ref 0–40)
Albumin: 4.4 g/dL (ref 3.8–4.9)
Alkaline Phosphatase: 90 [IU]/L (ref 44–121)
BUN/Creatinine Ratio: 23 (ref 9–23)
BUN: 17 mg/dL (ref 6–24)
Bilirubin Total: 0.5 mg/dL (ref 0.0–1.2)
CO2: 27 mmol/L (ref 20–29)
Calcium: 9.7 mg/dL (ref 8.7–10.2)
Chloride: 101 mmol/L (ref 96–106)
Creatinine, Ser: 0.74 mg/dL (ref 0.57–1.00)
Globulin, Total: 2.6 g/dL (ref 1.5–4.5)
Glucose: 95 mg/dL (ref 70–99)
Potassium: 4.3 mmol/L (ref 3.5–5.2)
Sodium: 140 mmol/L (ref 134–144)
Total Protein: 7 g/dL (ref 6.0–8.5)
eGFR: 96 mL/min/{1.73_m2} (ref >59)

## 2023-08-08 LAB — HEMOGLOBIN A1C
Est. average glucose Bld gHb Est-mCnc: 114 mg/dL
Hgb A1c MFr Bld: 5.6 % (ref 4.8–5.6)

## 2023-08-08 LAB — VITAMIN K1, SERUM: VITAMIN K1: 0.29 ng/mL (ref 0.10–2.20)

## 2023-08-08 LAB — VITAMIN E
Vitamin E (Alpha Tocopherol): 12.9 mg/L (ref 7.0–25.1)
Vitamin E(Gamma Tocopherol): 0.4 mg/L — ABNORMAL LOW (ref 0.5–5.5)

## 2023-08-08 LAB — TSH: TSH: 1.42 u[IU]/mL (ref 0.450–4.500)

## 2023-08-08 LAB — VITAMIN A: Vitamin A: 43.9 ug/dL (ref 20.1–62.0)

## 2023-08-08 LAB — LIPOPROTEIN A (LPA): Lipoprotein (a): 195.9 nmol/L — ABNORMAL HIGH (ref <75.0)

## 2023-08-08 LAB — B12 AND FOLATE PANEL
Folate: >20 ng/mL (ref >3.0)
Vitamin B-12: 887 pg/mL (ref 232–1245)

## 2023-08-08 LAB — VITAMIN D 25 HYDROXY (VIT D DEFICIENCY, FRACTURES): Vit D, 25-Hydroxy: 40.1 ng/mL (ref 30.0–100.0)

## 2023-08-10 ENCOUNTER — Other Ambulatory Visit: Payer: Self-pay | Admitting: Family Medicine

## 2023-08-10 MED ORDER — PANCRELIPASE (LIP-PROT-AMYL) 12000-38000 UNITS PO CPEP: ORAL_CAPSULE | ORAL | 0 refills | Status: DC

## 2023-08-10 NOTE — Progress Notes (Signed)
Significant elevation of Lipoprotein a. Given previous LDL elevation; diet and exercise can be tweaked as you noted in your [facility] note; however, a statin is recommended to help improve LDL and reduce ASCVD risk. You could also consider follow up with cardiology team to discuss next steps including the use of a coronary CT scan.  A1c had increased to borderline at 5.6% Continue to recommend balanced, lower carb meals. Smaller meal size, adding snacks. Choosing water as drink of choice and increasing purposeful exercise.  All other labs including vitamins appears stable.

## 2023-08-11 ENCOUNTER — Encounter: Payer: Medicare Other | Admitting: Speech Pathology

## 2023-08-18 ENCOUNTER — Encounter: Payer: Medicare Other | Admitting: Speech Pathology

## 2023-08-25 ENCOUNTER — Encounter: Payer: Medicare Other | Admitting: Speech Pathology

## 2023-09-01 ENCOUNTER — Encounter: Payer: Medicare Other | Admitting: Speech Pathology

## 2023-09-08 ENCOUNTER — Encounter: Payer: Medicare Other | Admitting: Speech Pathology

## 2023-09-11 ENCOUNTER — Other Ambulatory Visit: Payer: Self-pay | Admitting: Cardiology

## 2023-09-11 DIAGNOSIS — R9430 Abnormal result of cardiovascular function study, unspecified: Secondary | ICD-10-CM

## 2023-09-11 DIAGNOSIS — Z8249 Family history of ischemic heart disease and other diseases of the circulatory system: Secondary | ICD-10-CM

## 2023-09-11 DIAGNOSIS — E785 Hyperlipidemia, unspecified: Secondary | ICD-10-CM

## 2023-09-15 ENCOUNTER — Ambulatory Visit
Admission: RE | Admit: 2023-09-15 | Discharge: 2023-09-15 | Disposition: A | Discharge status: Home / Self Care | Payer: Self-pay | Source: Ambulatory Visit | Attending: Cardiology | Admitting: Cardiology

## 2023-09-15 DIAGNOSIS — E785 Hyperlipidemia, unspecified: Secondary | ICD-10-CM | POA: Insufficient documentation

## 2023-09-15 DIAGNOSIS — Z8249 Family history of ischemic heart disease and other diseases of the circulatory system: Secondary | ICD-10-CM | POA: Insufficient documentation

## 2023-09-15 DIAGNOSIS — R9430 Abnormal result of cardiovascular function study, unspecified: Secondary | ICD-10-CM | POA: Insufficient documentation

## 2023-09-23 ENCOUNTER — Other Ambulatory Visit: Payer: Self-pay | Admitting: Obstetrics and Gynecology

## 2023-09-23 DIAGNOSIS — N898 Other specified noninflammatory disorders of vagina: Secondary | ICD-10-CM

## 2023-10-07 ENCOUNTER — Encounter: Payer: Self-pay | Admitting: Oncology

## 2023-10-23 ENCOUNTER — Encounter: Payer: Self-pay | Admitting: Oncology

## 2023-10-26 ENCOUNTER — Other Ambulatory Visit: Payer: Medicare Other

## 2023-10-26 ENCOUNTER — Ambulatory Visit: Payer: Medicare Other | Admitting: Oncology

## 2023-10-26 NOTE — Progress Notes (Signed)
PCP: Erasmo Downer, MD   Chief Complaint  Patient presents with   Gynecologic Exam    Vag irritation, little itching, vag pain x several months.    HPI:      [name] is a 53 y.o. G1P0010 who LMP was No LMP recorded. Patient is postmenopausal., presents today for her annual examination.  Her menses are absent due to endometrial ablation due to menorrhagia/anemia (labs suggestive of menopause 5/21).  She does not have PMB. She does not have vasomotor sx. Hx of leiomyoma in past.  Sex activity: not sexually active due to pain/medical conditions. She does have vaginal dryness and uses estrace crm 1 g twice wkly with some relief. Needs RF sent to [facility]. Has been having some external vaginal irritation/itching on labia and at introitus, no increased d/c or odor. Not using scented products/ no regular pad use. Not using vag ERT externally   Last Pap: 10/14/22 Results were: no abnormalities /neg HPV DNA.  Hx of STDs: none  Last mammogram: 04/16/23 at [facility] Results were: normal--routine follow-up in 12 months  There is a FH of breast cancer in her mat aunt who was BRCA neg. Strong FH cancers on both sides, and pt has personal hx of colon cancer age 66.  Had neg cancer genetic testing at [facility] in ~2015/2016 [place]) with a POLB VUS in research trials. Being followed at [facility]. Pt did MyRisk testing 1/20 with RPS20 VUS. IBIS=10.8%/riskscore=12%. The patient does not do self-breast exams.  Dr. Smith Robert was having [place] follow pt for Lynch cancers even though she had negative genetic testing. Was doing yearly GYN u/s and ROMA. Last GYN u/s 1/23 was normal. Normal Roma 12/22. Per pt, GC doesn't think ovarian cancer screening indicated for pt so pt ok to not do it.   She is current on colonoscopies; had one 11/22 with [facility]; repeat after 3-5 yrs per pt. Case being reviewed. Had stage 2 colon cancer with RT hemicolectomy 2016.  Tobacco use: The patient denies current or previous tobacco  use. Alcohol use: none No drug use Exercise: very active usually  She does get adequate calcium and Vitamin D in her diet. Hx of osteopenia and mom has osteoporosis. She exercises regularly, as well as does PT for connective tissue disorder. DEXA done 2021 with PCP.   Labs with PCP/other MDs. Is complex medical pt.  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   Urinary frequency 09/26/2021   Atypical facial pain (1ry area of Pain) (Left) 08/29/2021   Chronic ear pain (Left) 08/29/2021   Temporal headache (Left) 08/29/2021   Inner ear pain (Left) 08/29/2021   Trigeminal nerve disorder (Left) 08/29/2021   History of photophobia 08/29/2021   TMJ (temporomandibular joint syndrome) 08/29/2021   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   Polypharmacy 08/29/2021   Severe frontal headaches 08/29/2021   Chronic occipital headache (Midline) (2ry area of Pain) 08/29/2021   Chronic neck pain (posterior) (4th area of Pain) (Bilateral) (L>R) 08/29/2021   Musculoskeletal disorder involving upper trapezius muscle 08/29/2021   Musculoskeletal disorder involving sternocleidomastoid (Left) 08/29/2021   Abnormal MRI, cervical spine (02/11/2019)  08/29/2021   Shoulder stiffness (Right) 08/29/2021   Pharmacologic therapy 08/27/2021   Disorder of skeletal system 08/27/2021   Problems influencing health status 08/27/2021   GAD (generalized anxiety disorder) 07/17/2021   Difficulty walking 07/17/2021   Headache disorder (3ry area of Pain) 07/17/2021   Photophobia 07/17/2021   Difficulty with speech 07/17/2021   Tear  film insufficiency 07/15/2021   Left lower quadrant abdominal pain 07/05/2021   Palpitations 06/11/2021   Axillary lymphadenopathy 06/11/2021   Immunosuppressed status (HCC) 04/19/2021   History of seronegative inflammatory arthritis 06/28/2020   Osteopenia of multiple sites 06/14/2020   Polyarthralgia 06/12/2020   Iodine deficiency 06/12/2020   OSA on CPAP 06/12/2020   Chronic pain syndrome 06/12/2020   Psychophysiological insomnia 11/26/2019   Calcific tendinitis of shoulder (Right) 04/10/2019   History of iron deficiency anemia 11/04/2018   History of basal cell carcinoma 10/07/2018   Vaginal dryness 09/07/2018   Chronic jaw pain (Left) 07/15/2018   Chronic migraine with aura 07/15/2018   Ehlers-Danlos syndrome, type 3 07/15/2018   Rectocele 05/21/2018   Ehlers-Danlos syndrome 03/15/2018   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   Cardiac arrhythmia 01/21/2018   Polyp of colon 01/21/2018   High cholesterol 01/21/2018   Chronic constipation 01/21/2018   Instability of shoulder joint (Right) 01/12/2018   Trigeminal neuralgia (V1-3) (Left) 12/25/2017   Instability of shoulder joint (Left) 12/25/2017   Nausea 12/25/2017   Occipital neuralgia 12/25/2017   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   Hiatal hernia 10/14/2017   TMJ (dislocation of temporomandibular joint) 09/29/2017   Irritable bowel syndrome 07/02/2017   Anal fissure 07/02/2017   Anemia 07/02/2017   Gastroesophageal reflux disease 07/02/2017   Hemorrhoids 07/02/2017   Malignant neoplasm of skin 07/02/2017   Malignant tumor of colon (HCC) 07/02/2017   Arthritis 07/02/2017   Fibromyalgia 07/02/2017   Pulsatile tinnitus of ear (Left) 12/12/2016   Restless leg syndrome 08/01/2016   Cervical dystonia 03/21/2016   Cervical spine disease 03/21/2016   Cervicalgia 02/06/2016   Paresthesia 02/06/2016   Hypercholesterolemia 12/24/2015   Iron deficiency anemia 10/18/2015    Vitamin D deficiency disease 10/18/2015   Midline low back pain without sciatica 02/06/2015   Hyperglycemia 10/30/2014   History of colon cancer 09/27/2014   Postural orthostatic tachycardia syndrome 09/16/2003   Chronic fatigue 09/15/2000   Asthma 09/15/1988   Dyspnea 09/15/1988    Past Surgical History:  Procedure Laterality Date   APPENDECTOMY     removed with colectomy   BREAST SURGERY     reduction   COLON SURGERY  2008   for colon cancer, ~1/3 of colon removed, primary reanastimosis   COLONOSCOPY WITH PROPOFOL N/A 10/07/2018   Procedure: COLONOSCOPY WITH PROPOFOL;  Surgeon: Toney Reil, MD;  Location: [facility] ENDOSCOPY;  Service: Gastroenterology;  Laterality: N/A;   COLONOSCOPY WITH PROPOFOL N/A 08/14/2021   Procedure: COLONOSCOPY WITH PROPOFOL;  Surgeon: Toney Reil, MD;  Location: [facility] ENDOSCOPY;  Service: Gastroenterology;  Laterality: N/A;   ENDOMETRIAL ABLATION     trigeminal stimulator      Family History  Problem Relation Age of Onset   Colon cancer Father 33   Cancer Father    Early death Father    Breast cancer Maternal Aunt 5       BRCA neg   Endometrial cancer Maternal Aunt 50   Cancer Maternal Aunt    Obesity Maternal Aunt    Colon cancer Maternal Grandmother 91   Migraines Maternal  Grandmother    Tremor Maternal Grandmother    Cancer Maternal Grandmother    Varicose Veins Maternal Grandmother    Leukemia Maternal Grandfather 6   Food Allergy Maternal Grandfather    Cancer Maternal Grandfather    Heart disease Maternal Grandfather    Migraines Mother    Glaucoma Mother    Cataracts Mother    Tremor Mother    Fibromyalgia Mother    Heart disease Mother    ADD / ADHD Mother    Anxiety disorder Mother    Arthritis Mother    Hearing loss Mother    Hyperlipidemia Mother    Hypertension Mother    Varicose Veins Mother    Colon polyps Brother        non-cancerous   ADD / ADHD Brother     Social History   Socioeconomic History    Marital status: Married    Spouse name: Not on file   Number of children: 0   Years of education: Not on file   Highest education level: Master's degree (e.g., MA, MS, MEng, MEd, MSW, MBA)  Occupational History   Occupation: SSI and disability  Tobacco Use   Smoking status: Former    Current packs/day: 0.00    Average packs/day: 0.1 packs/day for 5.0 years (0.5 ttl pk-yrs)    Types: Cigarettes    Quit date: 09/15/1994    Years since quitting: 29.1   Smokeless tobacco: Never   Tobacco comments:    Smoked a few cigarettes in high school and college, but never a regular smoker  Vaping Use   Vaping status: Never Used  Substance and Sexual Activity   Alcohol use: Not Currently   Drug use: Not Currently    Types: Marijuana    Comment: Marijuana occasionally in my 20's   Sexual activity: Not Currently    Birth control/protection: Abstinence, Post-menopausal  Other Topics Concern   Not on file  Social History Narrative   Not on file   Social Drivers of Health   Financial Resource Strain: Medium Risk (04/22/2023)   Overall Financial Resource Strain (CARDIA)    Difficulty of Paying Living Expenses: Somewhat hard  Food Insecurity: No Food Insecurity (04/22/2023)   Hunger Vital Sign    Worried About Running Out of Food in the Last Year: Never true    Ran Out of Food in the Last Year: Never true  Transportation Needs: Unmet Transportation Needs (04/22/2023)   PRAPARE - Transportation    Lack of Transportation (Medical): Yes    Lack of Transportation (Non-Medical): Yes  Physical Activity: Insufficiently Active (04/22/2023)   Exercise Vital Sign    Days of Exercise per Week: 6 days    Minutes of Exercise per Session: 20 min  Stress: Stress Concern Present (04/22/2023)   [employer] of Occupational Health - Occupational Stress Questionnaire    Feeling of Stress : To some extent  Social Connections: Socially Integrated (04/22/2023)   Social Connection and Isolation Panel [NHANES]     Frequency of Communication with Friends and Family: Twice a week    Frequency of Social Gatherings with Friends and Family: Once a week    Attends Religious Services: More than 4 times per year    Active Member of [employer] or Organizations: Yes    Attends Engineer, structural: More than 4 times per year    Marital Status: Married  Catering manager Violence: Not on file     Current Outpatient Medications:    acetaminophen (TYLENOL)  500 MG tablet, Take 500 mg by mouth every 6 (six) hours as needed., Disp: , Rfl:    albuterol (PROVENTIL HFA;VENTOLIN HFA) 108 (90 Base) MCG/ACT inhaler, Inhale 2 puffs into the lungs every 4 (four) hours as needed. , Disp: , Rfl:    aspirin 325 MG tablet, Take 325 mg by mouth., Disp: , Rfl:    Atogepant (QULIPTA) 10 MG TABS, Take 1 tablet by mouth daily., Disp: 30 tablet, Rfl: 6   azelastine (ASTELIN) 0.1 % nasal spray, Place into both nostrils as needed for rhinitis. Use in each nostril as directed, Disp: , Rfl:    Blood Pressure Monitoring (BLOOD PRESSURE CUFF) MISC, Take blood pressure as needed for symptoms., Disp: , Rfl:    Boswellia Serrata (BOSWELLIA PO), Take 150 mg by mouth every morning. , Disp: , Rfl:    Calcium Citrate 200 MG TABS, Take 600 mg by mouth., Disp: , Rfl:    cromolyn (GASTROCROM) 100 MG/5ML solution, Take by mouth 4 (four) times daily -  before meals and at bedtime., Disp: , Rfl:    Cyanocobalamin (VITAMIN B-12) 1000 MCG SUBL, Place under the tongue., Disp: , Rfl:    desloratadine (CLARINEX) 5 MG tablet, Take 5 mg by mouth as needed., Disp: , Rfl:    Diclofenac Sodium 1.5 % SOLN, 40 drops Externally Four times a day, Disp: , Rfl:    diphenhydrAMINE (BENADRYL) 12.5 MG/5ML liquid, Take by mouth 4 (four) times daily as needed., Disp: , Rfl:    diphenhydrAMINE-Allantoin 2-0.5 % CREA, Apply 1 application topically as needed. , Disp: , Rfl:    docusate sodium (COLACE) 100 MG capsule, Take 100 mg by mouth every other day. , Disp: , Rfl:     EPINEPHrine 0.3 mg/0.3 mL IJ SOAJ injection, Inject 0.3 mg into the muscle as needed for anaphylaxis. INJECT ONE AUTO-INJECTOR INTRAMUSCULARLY AS NEEDED, Disp: 2 each, Rfl: 5   famotidine (PEPCID) 10 MG tablet, Take 10 mg by mouth daily. , Disp: , Rfl:    fexofenadine (ALLEGRA) 180 MG tablet, Take 180 mg by mouth 2 (two) times daily., Disp: , Rfl:    Folic Acid (FOLATE PO), Take 0.5 tablets by mouth daily. Folate as calcium foliate 680 mcg DFE, Disp: , Rfl:    guaifenesin (HUMIBID E) 400 MG TABS tablet, Take 400 mg by mouth as needed., Disp: , Rfl:    hydrOXYzine (ATARAX) 25 MG tablet, Take 25 mg by mouth as needed., Disp: , Rfl:    IODINE EX, Apply 225 mcg topically. Every 3 days, Disp: , Rfl:    KETAMINE HCL SL, Place into right nostril as needed., Disp: , Rfl:    ketotifen (ZADITOR) 0.025 % ophthalmic solution, Place 1 drop into both eyes daily as needed., Disp: , Rfl:    Ketotifen Fumarate POWD, 0.5 mg by Does not apply route. 6-12 times a day, Disp: , Rfl:    L-Lysine 500 MG CAPS, Take by mouth 3 (three) times daily., Disp: , Rfl:    L-Lysine HCl 500 MG CAPS, Take by mouth., Disp: , Rfl:    L-Theanine 100 MG CAPS, Take 200 mg by mouth as needed., Disp: , Rfl:    levocetirizine (XYZAL) 2.5 MG/5ML solution, as needed., Disp: , Rfl:    Light Mineral Oil-Mineral Oil 0.5-0.5 % EMUL, Apply 1 application to eye daily., Disp: , Rfl:    lipase/protease/amylase (CREON) 12000-38000 units CPEP capsule, TAKE 1-3 CAPSULES (12,000-36,000 UNITS TOTAL) BY MOUTH 3 (THREE) TIMES DAILY BEFORE MEALS. PATIENT MAY TAKE  AN ADDITIONAL 3 CAPSULES PER DAY FOR SNACK., Disp: 1080 capsule, Rfl: 0   loperamide (IMODIUM) 2 MG capsule, Take by mouth as needed for diarrhea or loose stools., Disp: , Rfl:    Lutein-Zeaxanthin 25-5 MG CAPS, Take 1 capsule by mouth every other day., Disp: , Rfl:    Magnesium Citrate POWD, Take 100 mg by mouth daily., Disp: , Rfl:    Melatonin 1 MG/ML LIQD, Take 1 mg by mouth daily as needed.,  Disp: , Rfl:    montelukast (SINGULAIR) 5 MG chewable tablet, Chew 10 mg by mouth daily at 2 PM., Disp: , Rfl:    Non Gelatin Capsules, Empty, (CAPSULE #3 CLEAR/CLEAR VEG) CAPS, Take 1 capsule by mouth daily., Disp: , Rfl:    NONFORMULARY OR COMPOUNDED ITEM, Bupivacaine nasal 0.5% drops w/o epi, Disp: , Rfl:    omega-3 acid ethyl esters (LOVAZA) 1 g capsule, TAKE 2 CAPSULES BY MOUTH 2 TIMES DAILY., Disp: 360 capsule, Rfl: 3   PHOSPHATIDYL CHOLINE PO, Take 1 tablet by mouth daily., Disp: , Rfl:    pseudoephedrine (SUDAFED) 30 MG tablet, Take 30 mg by mouth every 4 (four) hours as needed for congestion. , Disp: , Rfl:    PYRIDOSTIGMINE BROMIDE PO, Take 7.5 mg by mouth 5 (five) times daily. 4-10 times per day, Disp: , Rfl:    Riboflavin (VITAMIN B-2 PO), Take 100 mg by mouth 3 (three) times daily., Disp: , Rfl:    Selenium 200 MCG CAPS, Take 1 capsule by mouth. Twice a month, Disp: , Rfl:    simethicone (MYLICON) 80 MG chewable tablet, Chew 80 mg by mouth every 6 (six) hours as needed for flatulence., Disp: , Rfl:    Sodium Fluoride 1.1 % PSTE, Place onto teeth., Disp: , Rfl:    trolamine salicylate (ASPERCREME/ALOE) 10 % cream, Apply topically., Disp: , Rfl:    Ubrogepant (UBRELVY) 50 MG TABS, Take 1 tablet (50 mg total) by mouth as needed (for migriane). May repeat a dose in 2 hours if headache persists, Disp: 16 tablet, Rfl: 6   vitamin C (ASCORBIC ACID) 500 MG tablet, Take 500 mg by mouth daily., Disp: , Rfl:    Vitamin D, Cholecalciferol, 10 MCG (400 UNIT) TABS, Take 1,000 Int'l Units/day by mouth daily., Disp: , Rfl:    ZORYVE 0.3 % CREA, APPLY TO ELBOWS ONCE DAILY., Disp: 60 g, Rfl: 2   estradiol (ESTRACE) 0.1 MG/GM vaginal cream, INSERT 1 GRAM VAGINALLY 2  TIMES A WEEK, Disp: 42.5 g, Rfl: 2     ROS:  Review of Systems  Constitutional:  Positive for fatigue. Negative for fever and unexpected weight change.  Respiratory:  Positive for shortness of breath. Negative for cough and wheezing.    Cardiovascular:  Positive for chest pain. Negative for palpitations.  Gastrointestinal:  Positive for constipation, diarrhea and nausea. Negative for blood in stool and vomiting.  Endocrine: Positive for cold intolerance and heat intolerance.  Genitourinary:  Positive for dyspareunia and dysuria. Negative for flank pain, frequency, genital sores, hematuria, menstrual problem, pelvic pain, urgency, vaginal bleeding, vaginal discharge and vaginal pain.  Musculoskeletal:  Positive for arthralgias, joint swelling and myalgias.  Skin:  Positive for rash.  Allergic/Immunologic: Positive for environmental allergies.  Neurological:  Positive for headaches. Negative for dizziness, syncope, light-headedness and numbness.  Hematological:  Negative for adenopathy. Bruises/bleeds easily.  Psychiatric/Behavioral:  Positive for agitation. Negative for confusion, sleep disturbance and suicidal ideas. The patient is not nervous/anxious.   BREAST: tenderness  Objective: BP 95/63   Pulse 73   Ht 5\' 2"  (1.575 m)   Wt 146 lb (66.2 kg)   BMI 26.70 kg/m    Physical Exam Constitutional:      Appearance: She is well-developed.  Genitourinary:     Vulva normal.     Genitourinary Comments: NEG EXT EXAM EXCEPT MILD ATROPHY; NO LESIONS/FISSURES     Right Labia: No rash, tenderness or lesions.    Left Labia: No tenderness, lesions or rash.       No vaginal discharge, erythema or tenderness.     Mild vaginal atrophy present.     Right Adnexa: not tender and no mass present.    Left Adnexa: not tender and no mass present.    No cervical friability or polyp.     Uterus is not enlarged or tender.  Breasts:    Right: No mass, nipple discharge, skin change or tenderness.     Left: No mass, nipple discharge, skin change or tenderness.  Neck:     Thyroid: No thyromegaly.  Cardiovascular:     Rate and Rhythm: Normal rate and regular rhythm.     Heart sounds: Normal heart sounds. No murmur  heard. Pulmonary:     Effort: Pulmonary effort is normal.     Breath sounds: Normal breath sounds.  Abdominal:     Palpations: Abdomen is soft.     Tenderness: There is abdominal tenderness in the suprapubic area. There is no guarding or rebound.  Musculoskeletal:        General: Normal range of motion.     Cervical back: Normal range of motion.  Lymphadenopathy:     Cervical: No cervical adenopathy.  Neurological:     General: No focal deficit present.     Mental Status: She is alert and oriented to person, place, and time.     Cranial Nerves: No cranial nerve deficit.  Skin:    General: Skin is warm and dry.  Psychiatric:        Mood and Affect: Mood normal.        Behavior: Behavior normal.        Thought Content: Thought content normal.        Judgment: Judgment normal.  Vitals reviewed.     Assessment/Plan: Encounter for annual routine gynecological examination  Encounter for screening mammogram for malignant neoplasm of breast - Plan: MM 3D SCREENING MAMMOGRAM BILATERAL BREAST; pt to schedule mammo 8/25  Vaginal dryness - Plan: estradiol (ESTRACE) 0.1 MG/GM vaginal cream; sx improved with twice wkly dosing, Rx RF eRxd to mail order. Use pea sized amount ext twice wkly for ext sx. F/u prn.   Vaginal itching--neg exam, mild sx, question atrophy. Start vag ERT externally and f/u prn sx.   Screening for ovarian cancer--no longer indicated per GC/pt  History of colon cancer--followed by GI   Meds ordered this encounter  Medications   estradiol (ESTRACE) 0.1 MG/GM vaginal cream    Sig: INSERT 1 GRAM VAGINALLY 2  TIMES A WEEK    Dispense:  42.5 g    Refill:  2    Supervising Provider:   Waymon Budge           GYN counsel breast self exam, mammography screening, menopause, adequate intake of calcium and vitamin D, diet and exercise    F/U  Return in about 1 year (around 10/26/2024).  Autumn Zhang B. Rhiley Tarver, PA-C 10/27/2023 11:35 AM

## 2023-10-27 ENCOUNTER — Ambulatory Visit (INDEPENDENT_AMBULATORY_CARE_PROVIDER_SITE_OTHER): Payer: Medicare Other | Admitting: Obstetrics and Gynecology

## 2023-10-27 ENCOUNTER — Encounter: Payer: Self-pay | Admitting: Obstetrics and Gynecology

## 2023-10-27 VITALS — BP 95/63 | HR 73 | Ht 62.0 in | Wt 146.0 lb

## 2023-10-27 DIAGNOSIS — Z85038 Personal history of other malignant neoplasm of large intestine: Secondary | ICD-10-CM

## 2023-10-27 DIAGNOSIS — Z01419 Encounter for gynecological examination (general) (routine) without abnormal findings: Secondary | ICD-10-CM | POA: Diagnosis not present

## 2023-10-27 DIAGNOSIS — Z1231 Encounter for screening mammogram for malignant neoplasm of breast: Secondary | ICD-10-CM

## 2023-10-27 DIAGNOSIS — N898 Other specified noninflammatory disorders of vagina: Secondary | ICD-10-CM

## 2023-10-27 MED ORDER — ESTRADIOL 0.1 MG/GM VA CREA
TOPICAL_CREAM | VAGINAL | 2 refills | Status: DC
Start: 2023-10-27 — End: 2024-05-02

## 2023-10-27 NOTE — Patient Instructions (Signed)
I value your feedback and you entrusting [place] with your care. If you get a [place] patient survey, I would appreciate you taking the time to let us know about your experience today. Thank you! ? ? ?

## 2023-10-30 ENCOUNTER — Encounter: Payer: Self-pay | Admitting: Oncology

## 2023-10-30 ENCOUNTER — Telehealth: Payer: Self-pay

## 2023-10-30 ENCOUNTER — Inpatient Hospital Stay: Payer: Medicare Other | Attending: Oncology

## 2023-10-30 ENCOUNTER — Inpatient Hospital Stay (HOSPITAL_BASED_OUTPATIENT_CLINIC_OR_DEPARTMENT_OTHER): Payer: Medicare Other | Admitting: Oncology

## 2023-10-30 VITALS — HR 72 | Temp 98.3°F | Resp 18 | Wt 146.0 lb

## 2023-10-30 DIAGNOSIS — Z1509 Genetic susceptibility to other malignant neoplasm: Secondary | ICD-10-CM | POA: Diagnosis not present

## 2023-10-30 DIAGNOSIS — D509 Iron deficiency anemia, unspecified: Secondary | ICD-10-CM

## 2023-10-30 DIAGNOSIS — Z85038 Personal history of other malignant neoplasm of large intestine: Secondary | ICD-10-CM | POA: Diagnosis not present

## 2023-10-30 DIAGNOSIS — Z87891 Personal history of nicotine dependence: Secondary | ICD-10-CM | POA: Diagnosis not present

## 2023-10-30 DIAGNOSIS — Z862 Personal history of diseases of the blood and blood-forming organs and certain disorders involving the immune mechanism: Secondary | ICD-10-CM

## 2023-10-30 LAB — FERRITIN: Ferritin: 39 ng/mL (ref 11–307)

## 2023-10-30 LAB — IRON AND TIBC
Iron: 72 ug/dL (ref 28–170)
Saturation Ratios: 18 % (ref 10.4–31.8)
TIBC: 405 ug/dL (ref 250–450)
UIBC: 333 ug/dL

## 2023-10-30 LAB — CBC
HCT: 38.9 % (ref 36.0–46.0)
Hemoglobin: 12.6 g/dL (ref 12.0–15.0)
MCH: 29.9 pg (ref 26.0–34.0)
MCHC: 32.4 g/dL (ref 30.0–36.0)
MCV: 92.4 fL (ref 80.0–100.0)
Platelets: 288 10*3/uL (ref 150–400)
RBC: 4.21 MIL/uL (ref 3.87–5.11)
RDW: 12.5 % (ref 11.5–15.5)
WBC: 8.5 10*3/uL (ref 4.0–10.5)
nRBC: 0 % (ref 0.0–0.2)

## 2023-10-30 NOTE — Telephone Encounter (Signed)
Called patient per Autumn Zhang Mention, to let her know that she will need to come back to pick up the lab form she will need when she goes to get her next labs drawn.

## 2023-10-31 ENCOUNTER — Encounter: Payer: Self-pay | Admitting: Oncology

## 2023-10-31 NOTE — Telephone Encounter (Signed)
Megan please schedule ferlicit Q2 weeks for 3 doses like she wants it

## 2023-10-31 NOTE — Progress Notes (Signed)
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  She underwent right hemicolectomy which revealed poorly differentiated invasive adenocarcinoma T3N0.  0 out of 34 lymph nodes were positive.  She received 12 cycles of adjuvant chemotherapy with 5-FU leucovorin.  No oxaliplatin.  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 her father died of colon cancer at the age of 78.  Her only brother has also had colon polyps.  She has therefore been managed as a Lynch syndrome patient despite not having genetic features suggestive of Lynch syndrome.  She took prophylactic aspirin for a while and then she stopped taking it.  She has been getting colonoscopies every 1 to 2 years.  She has also had capsule endoscopy in 2014 for iron deficiency anemia that was normal.  She has not required any surveillance scans as she has been more than 5 years since her diagnosis of colon cancer. She has established care with Dr. Allegra Lai for this.   Other than that patient has a history of basal cell skin cancer x2 and is in the process of finding a dermatologist.  She was also getting yearly pelvic exams and pelvic ultrasound to screen for GYN cancers.  Patient has a history of mast cell activation syndrome and often needs Benadryl before taking oral iron.  Also reports that she has dysautonomia and pots for which she sees cardiology. Also has a h/o Ehler Danlos syndrome    Currently patient has an ongoing migraine attack.  She is wearing dark glasses and a cat and unable to see eye to eye.  She denies any blood in her stool or urine presently.  Denies any dark melanotic stools.  Her last IV iron was about 6 months ago and she does not remember what type of IV iron she has received.    She sees Dr. Hoy Finlay from [facility] for mast cell activation syndrome    Interval history- reports pain in her left side of her throat for last 1 week. Denies any fever, cough or shortness of breath  ECOG PS- 1 Pain scale- 0   Review of systems- Review of Systems  Constitutional:  Negative for chills, fever, malaise/fatigue and weight loss.  HENT:  Negative for congestion, ear discharge and nosebleeds.        Left throat pain  Eyes:  Negative for blurred vision.  Respiratory:  Negative for cough, hemoptysis, sputum production, shortness of breath and wheezing.   Cardiovascular:  Negative for chest pain, palpitations, orthopnea and claudication.  Gastrointestinal:  Negative for abdominal pain, blood in stool, constipation, diarrhea, heartburn, melena, nausea and vomiting.  Genitourinary:  Negative for dysuria, flank pain, frequency, hematuria and urgency.  Musculoskeletal:  Negative for back pain, joint pain and myalgias.  Skin:  Negative for rash.  Neurological:  Negative for dizziness, tingling, focal weakness, seizures, weakness and headaches.  Endo/Heme/Allergies:  Does not bruise/bleed easily.  Psychiatric/Behavioral:  Negative for depression and suicidal ideas. The patient does not have insomnia.  Allergies  Allergen Reactions   Iodinated Contrast Media Hives, Itching, Palpitations, Photosensitivity and Shortness Of Breath    Other reaction(s): Headache Other reaction(s): Headache   Ioversol Anxiety, Hives, Itching and Palpitations   Ciprofloxacin Other (See Comments)   Levofloxacin Other (See Comments)    Congestion & Headaches Congestion & Headaches    Other  Other (See Comments)    Novocaine (IV) form Hearing loss Novocaine (IV) form Hearing loss  Congestion & Headaches Congestion & Headaches Congestion & Headaches Congestion & Headaches  Other reaction(s): Unknown   Soy Allergy (Obsolete)     Other reaction(s): Other (See Comments), Unknown Congestion & Headaches Congestion & Headaches   Soybean-Containing Drug Products Other (See Comments)    Congestion & Headaches    Bee Pollen    Bupropion    Celebrex [Celecoxib]    Codeine    Dexilant [Dexlansoprazole]    Doxepin     Other Reaction(s): Unknown   Dulera [Mometasone Furo-Formoterol Fum]    Fentanyl    Fluticasone     Inhaled into lungs it does not do well   Gabapentin    Gluten Meal    Histidine    Lidocaine     Rash to topical cream Flushing and redness   Lyrica [Pregabalin]    Mold Extract [Trichophyton]    Morphine And Codeine    Penicillins     Skin testing positive for allergy   Pollen Extract    Polysorbate [Sorbitan]    Prilosec [Omeprazole]    Rizatriptan Other (See Comments)    Dizziness, headache, myalgias, nausea   Salmeterol Xinafoate    Silenor [Doxepin Hcl]    Stevioside Other (See Comments)   Tomato    Xylitol Other (See Comments)   Brewer's Dried Yeast [Yeast] Other (See Comments)    Headache, flushing   Ethanol Dermatitis, Other (See Comments), Itching and Palpitations   Hydrocodone Nausea Only   Ibuprofen Nausea And Vomiting    At high doses At high doses    Lanolin Other (See Comments)    Exacerbates neuralgia symptoms Wool causes hives.    Morphine Nausea Only   Wheat Diarrhea   Yeast-Derived Drug Products Other (See Comments)    Headache, flushing     Past Medical History:  Diagnosis Date   Allergy    Anemia    Anxiety    Arthritis    Asthma    Attention deficit hyperactivity disorder (ADHD), combined type 10/18/2015   Diagnosed many years ago.  Has been doing well on Ritalin 5mg  QID to six times a day. Long acting  hasn't worked well in the past and she has not wanted to try Adderall. She is taking wellbutrin as well, but she has been weaning medication due to SE migraine.  She is now down to Wellbutrin one quarter tablet daily.  Last Assessment & Plan:  A/P: Stable.  Continue current dosing.  Refilled 3 months   Basal cell carcinoma 05/16/2021   Superficial, R post lower leg. [facility] [place], [place]   Basal cell carcinoma 09/03/2006   L chest, R forearm. 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   Dysautonomia (HCC)    Dysplastic nevus 05/28/2009   right lateral back, [facility] [place], [place]   Dysplastic nevus 01/13/2023  left lower calf, mod to severe, shave removal 03/16/23, margins free 03/16/23   Ehlers-Danlos syndrome type III    Family history of colon cancer    GERD (gastroesophageal reflux disease)    Hyperlipidemia    Idiopathic small fiber peripheral neuropathy    Leiomyoma    Mast cell activation (HCC)    Migraines    Neck pain    Occipital neuralgia    Sleep apnea      Past Surgical History:  Procedure Laterality Date   APPENDECTOMY     removed with colectomy   BREAST SURGERY     reduction   COLON SURGERY  2008   for colon cancer, ~1/3 of colon removed, primary reanastimosis   COLONOSCOPY WITH PROPOFOL N/A 10/07/2018   Procedure: COLONOSCOPY WITH PROPOFOL;  Surgeon: Toney Reil, MD;  Location: [facility] ENDOSCOPY;  Service: Gastroenterology;  Laterality: N/A;   COLONOSCOPY WITH PROPOFOL N/A 08/14/2021   Procedure: COLONOSCOPY WITH PROPOFOL;  Surgeon: Toney Reil, MD;  Location: [facility] ENDOSCOPY;  Service: Gastroenterology;  Laterality: N/A;   ENDOMETRIAL ABLATION     trigeminal stimulator      Social History   Socioeconomic History   Marital status: Married     Spouse name: Not on file   Number of children: 0   Years of education: Not on file   Highest education level: Master's degree (e.g., MA, MS, MEng, MEd, MSW, MBA)  Occupational History   Occupation: SSI and disability  Tobacco Use   Smoking status: Former    Current packs/day: 0.00    Average packs/day: 0.1 packs/day for 5.0 years (0.5 ttl pk-yrs)    Types: Cigarettes    Quit date: 09/15/1994    Years since quitting: 29.1   Smokeless tobacco: Never   Tobacco comments:    Smoked a few cigarettes in high school and college, but never a regular smoker  Vaping Use   Vaping status: Never Used  Substance and Sexual Activity   Alcohol use: Not Currently   Drug use: Not Currently    Types: Marijuana    Comment: Marijuana occasionally in my 20's   Sexual activity: Not Currently    Birth control/protection: Abstinence, Post-menopausal  Other Topics Concern   Not on file  Social History Narrative   Not on file   Social Drivers of Health   Financial Resource Strain: Medium Risk (04/22/2023)   Overall Financial Resource Strain (CARDIA)    Difficulty of Paying Living Expenses: Somewhat hard  Food Insecurity: No Food Insecurity (04/22/2023)   Hunger Vital Sign    Worried About Running Out of Food in the Last Year: Never true    Ran Out of Food in the Last Year: Never true  Transportation Needs: Unmet Transportation Needs (04/22/2023)   PRAPARE - Transportation    Lack of Transportation (Medical): Yes    Lack of Transportation (Non-Medical): Yes  Physical Activity: Insufficiently Active (04/22/2023)   Exercise Vital Sign    Days of Exercise per Week: 6 days    Minutes of Exercise per Session: 20 min  Stress: Stress Concern Present (04/22/2023)   [employer] of Occupational Health - Occupational Stress Questionnaire    Feeling of Stress : To some extent  Social Connections: Socially Integrated (04/22/2023)   Social Connection and Isolation Panel [NHANES]    Frequency of Communication with  Friends and Family: Twice a week    Frequency of Social Gatherings with Friends and Family: Once a week    Attends Religious Services:  More than 4 times per year    Active Member of Clubs or Organizations: Yes    Attends Engineer, structural: More than 4 times per year    Marital Status: Married  Catering manager Violence: Not on file    Family History  Problem Relation Age of Onset   Colon cancer Father 56   Cancer Father    Early death Father    Breast cancer Maternal Aunt 50       BRCA neg   Endometrial cancer Maternal Aunt 50   Cancer Maternal Aunt    Obesity Maternal Aunt    Colon cancer Maternal Grandmother 70   Migraines Maternal Grandmother    Tremor Maternal Grandmother    Cancer Maternal Grandmother    Varicose Veins Maternal Grandmother    Leukemia Maternal Grandfather 74   Food Allergy Maternal Grandfather    Cancer Maternal Grandfather    Heart disease Maternal Grandfather    Migraines Mother    Glaucoma Mother    Cataracts Mother    Tremor Mother    Fibromyalgia Mother    Heart disease Mother    ADD / ADHD Mother    Anxiety disorder Mother    Arthritis Mother    Hearing loss Mother    Hyperlipidemia Mother    Hypertension Mother    Varicose Veins Mother    Colon polyps Brother        non-cancerous   ADD / ADHD Brother      Current Outpatient Medications:    acetaminophen (TYLENOL) 500 MG tablet, Take 500 mg by mouth every 6 (six) hours as needed., Disp: , Rfl:    albuterol (PROVENTIL HFA;VENTOLIN HFA) 108 (90 Base) MCG/ACT inhaler, Inhale 2 puffs into the lungs every 4 (four) hours as needed. , Disp: , Rfl:    aspirin 325 MG tablet, Take 325 mg by mouth., Disp: , Rfl:    Atogepant (QULIPTA) 10 MG TABS, Take 1 tablet by mouth daily., Disp: 30 tablet, Rfl: 6   azelastine (ASTELIN) 0.1 % nasal spray, Place into both nostrils as needed for rhinitis. Use in each nostril as directed, Disp: , Rfl:    Blood Pressure Monitoring (BLOOD PRESSURE CUFF)  MISC, Take blood pressure as needed for symptoms., Disp: , Rfl:    Boswellia Serrata (BOSWELLIA PO), Take 150 mg by mouth every morning. , Disp: , Rfl:    Calcium Citrate 200 MG TABS, Take 600 mg by mouth., Disp: , Rfl:    cromolyn (GASTROCROM) 100 MG/5ML solution, Take by mouth 4 (four) times daily -  before meals and at bedtime., Disp: , Rfl:    Cyanocobalamin (VITAMIN B-12) 1000 MCG SUBL, Place under the tongue., Disp: , Rfl:    desloratadine (CLARINEX) 5 MG tablet, Take 5 mg by mouth as needed., Disp: , Rfl:    Diclofenac Sodium 1.5 % SOLN, 40 drops Externally Four times a day, Disp: , Rfl:    diphenhydrAMINE (BENADRYL) 12.5 MG/5ML liquid, Take by mouth 4 (four) times daily as needed., Disp: , Rfl:    diphenhydrAMINE-Allantoin 2-0.5 % CREA, Apply 1 application topically as needed. , Disp: , Rfl:    docusate sodium (COLACE) 100 MG capsule, Take 100 mg by mouth every other day. , Disp: , Rfl:    EPINEPHrine 0.3 mg/0.3 mL IJ SOAJ injection, Inject 0.3 mg into the muscle as needed for anaphylaxis. INJECT ONE AUTO-INJECTOR INTRAMUSCULARLY AS NEEDED, Disp: 2 each, Rfl: 5   estradiol (ESTRACE) 0.1 MG/GM vaginal cream, INSERT  1 GRAM VAGINALLY 2  TIMES A WEEK, Disp: 42.5 g, Rfl: 2   famotidine (PEPCID) 10 MG tablet, Take 10 mg by mouth daily. , Disp: , Rfl:    fexofenadine (ALLEGRA) 180 MG tablet, Take 180 mg by mouth 2 (two) times daily., Disp: , Rfl:    Folic Acid (FOLATE PO), Take 0.5 tablets by mouth daily. Folate as calcium foliate 680 mcg DFE, Disp: , Rfl:    guaifenesin (HUMIBID E) 400 MG TABS tablet, Take 400 mg by mouth as needed., Disp: , Rfl:    hydrOXYzine (ATARAX) 25 MG tablet, Take 25 mg by mouth as needed., Disp: , Rfl:    IODINE EX, Apply 225 mcg topically. Every 3 days, Disp: , Rfl:    KETAMINE HCL SL, Place into right nostril as needed., Disp: , Rfl:    ketotifen (ZADITOR) 0.025 % ophthalmic solution, Place 1 drop into both eyes daily as needed., Disp: , Rfl:    Ketotifen Fumarate  POWD, 0.5 mg by Does not apply route. 6-12 times a day, Disp: , Rfl:    L-Lysine 500 MG CAPS, Take by mouth 3 (three) times daily., Disp: , Rfl:    L-Lysine HCl 500 MG CAPS, Take by mouth., Disp: , Rfl:    L-Theanine 100 MG CAPS, Take 200 mg by mouth as needed., Disp: , Rfl:    levocetirizine (XYZAL) 2.5 MG/5ML solution, as needed., Disp: , Rfl:    Light Mineral Oil-Mineral Oil 0.5-0.5 % EMUL, Apply 1 application to eye daily., Disp: , Rfl:    lipase/protease/amylase (CREON) 12000-38000 units CPEP capsule, TAKE 1-3 CAPSULES (12,000-36,000 UNITS TOTAL) BY MOUTH 3 (THREE) TIMES DAILY BEFORE MEALS. PATIENT MAY TAKE AN ADDITIONAL 3 CAPSULES PER DAY FOR SNACK., Disp: 1080 capsule, Rfl: 0   loperamide (IMODIUM) 2 MG capsule, Take by mouth as needed for diarrhea or loose stools., Disp: , Rfl:    Lutein-Zeaxanthin 25-5 MG CAPS, Take 1 capsule by mouth every other day., Disp: , Rfl:    Magnesium Citrate POWD, Take 100 mg by mouth daily., Disp: , Rfl:    Melatonin 1 MG/ML LIQD, Take 1 mg by mouth daily as needed., Disp: , Rfl:    montelukast (SINGULAIR) 5 MG chewable tablet, Chew 10 mg by mouth daily at 2 PM., Disp: , Rfl:    Non Gelatin Capsules, Empty, (CAPSULE #3 CLEAR/CLEAR VEG) CAPS, Take 1 capsule by mouth daily., Disp: , Rfl:    NONFORMULARY OR COMPOUNDED ITEM, Bupivacaine nasal 0.5% drops w/o epi, Disp: , Rfl:    omega-3 acid ethyl esters (LOVAZA) 1 g capsule, TAKE 2 CAPSULES BY MOUTH 2 TIMES DAILY., Disp: 360 capsule, Rfl: 3   PHOSPHATIDYL CHOLINE PO, Take 1 tablet by mouth daily., Disp: , Rfl:    pseudoephedrine (SUDAFED) 30 MG tablet, Take 30 mg by mouth every 4 (four) hours as needed for congestion. , Disp: , Rfl:    PYRIDOSTIGMINE BROMIDE PO, Take 7.5 mg by mouth 5 (five) times daily. 4-10 times per day, Disp: , Rfl:    Riboflavin (VITAMIN B-2 PO), Take 100 mg by mouth 3 (three) times daily., Disp: , Rfl:    Selenium 200 MCG CAPS, Take 1 capsule by mouth. Twice a month, Disp: , Rfl:     simethicone (MYLICON) 80 MG chewable tablet, Chew 80 mg by mouth every 6 (six) hours as needed for flatulence., Disp: , Rfl:    Sodium Fluoride 1.1 % PSTE, Place onto teeth., Disp: , Rfl:    trolamine salicylate (ASPERCREME/ALOE) 10 %  cream, Apply topically., Disp: , Rfl:    Ubrogepant (UBRELVY) 50 MG TABS, Take 1 tablet (50 mg total) by mouth as needed (for migriane). May repeat a dose in 2 hours if headache persists, Disp: 16 tablet, Rfl: 6   vitamin C (ASCORBIC ACID) 500 MG tablet, Take 500 mg by mouth daily., Disp: , Rfl:    Vitamin D, Cholecalciferol, 10 MCG (400 UNIT) TABS, Take 1,000 Int'l Units/day by mouth daily., Disp: , Rfl:    ZORYVE 0.3 % CREA, APPLY TO ELBOWS ONCE DAILY., Disp: 60 g, Rfl: 2  Physical exam:  Vitals:   10/30/23 1402  Pulse: 72  Resp: 18  Temp: 98.3 F (36.8 C)  TempSrc: Tympanic  SpO2: 99%  Weight: 146 lb (66.2 kg)   Physical Exam HENT:     Mouth/Throat:     Mouth: Mucous membranes are moist.     Pharynx: Oropharynx is clear.     Comments: B/l tonsilloliths noted left >right Cardiovascular:     Rate and Rhythm: Normal rate and regular rhythm.     Heart sounds: Normal heart sounds.  Pulmonary:     Effort: Pulmonary effort is normal.     Breath sounds: Normal breath sounds.  Abdominal:     General: Bowel sounds are normal.     Palpations: Abdomen is soft.  Skin:    General: Skin is warm and dry.  Neurological:     Mental Status: She is alert and oriented to person, place, and time.         Latest Ref Rng & Units 08/03/2023    7:13 AM  CMP  Glucose 70 - 99 mg/dL 95   BUN 6 - 24 mg/dL 17   Creatinine 8.46 - 1.00 mg/dL 9.62   Sodium 952 - 841 mmol/L 140   Potassium 3.5 - 5.2 mmol/L 4.3   Chloride 96 - 106 mmol/L 101   CO2 20 - 29 mmol/L 27   Calcium 8.7 - 10.2 mg/dL 9.7   Total Protein 6.0 - 8.5 g/dL 7.0   Total Bilirubin 0.0 - 1.2 mg/dL 0.5   Alkaline Phos 44 - 121 IU/L 90   AST 0 - 40 IU/L 26   ALT 0 - 32 IU/L 23       Latest Ref  Rng & Units 10/30/2023    1:48 PM  CBC  WBC 4.0 - 10.5 K/uL 8.5   Hemoglobin 12.0 - 15.0 g/dL 32.4   Hematocrit 40.1 - 46.0 % 38.9   Platelets 150 - 400 K/uL 288      Assessment and plan- Patient is a 53 y.o. female here for routine f/u of iron deficiency anemia  Patient is not presently anemic with a hemoglobin of 12.6/38.9.  Ferritin levels are 39 and therefore borderline low.  Moreover she has symptoms of restless leg syndrome and therefore we are trying to keep her ferritin greater than 75.  We will plan for 3 doses of Ferrlecit at this time with cetirizine as a premedication.  She would like to separate out Ferrlecit by 2 weeks.  Repeat CBC ferritin and iron studies in [facility] in 3 months and I will see her back in 6 months with labs.  Left-sided throat pain: No evidence of mouth sores or inflammation noted in the oropharynx.  Patient has bilateral tonsilloliths/tonsil stones left greater than right.  Usually these are harmless and it is unlikely that this is the cause of her left throat pain.  She could try salt water gargles  or water pick to flush the stones out.  If her left throat pain does not get better over the next 1 week she can be seen by ENT.   Visit Diagnosis 1. Iron deficiency anemia, unspecified iron deficiency anemia type      Dr. Owens Shark, MD, MPH [facility] at [facility] [phone] 10/31/2023 11:49 AM

## 2023-11-10 ENCOUNTER — Ambulatory Visit: Payer: Commercial Managed Care - PPO

## 2023-11-10 ENCOUNTER — Inpatient Hospital Stay: Payer: Medicare Other

## 2023-11-10 VITALS — BP 112/72 | HR 72 | Temp 97.1°F | Resp 16

## 2023-11-10 DIAGNOSIS — D509 Iron deficiency anemia, unspecified: Secondary | ICD-10-CM | POA: Diagnosis not present

## 2023-11-10 DIAGNOSIS — Z862 Personal history of diseases of the blood and blood-forming organs and certain disorders involving the immune mechanism: Secondary | ICD-10-CM

## 2023-11-10 MED ORDER — SODIUM CHLORIDE 0.9 % IV SOLN
125.0000 mg | INTRAVENOUS | Status: DC
  Administered 2023-11-10: 125 mg via INTRAVENOUS
  Filled 2023-11-10: qty 10

## 2023-11-10 MED ORDER — SODIUM CHLORIDE 0.9 % IV SOLN
INTRAVENOUS | Status: DC
  Filled 2023-11-10 (×2): qty 250

## 2023-11-10 MED ORDER — CETIRIZINE HCL 10 MG/ML IV SOLN
10.0000 mg | INTRAVENOUS | Status: DC
  Administered 2023-11-10: 10 mg via INTRAVENOUS
  Filled 2023-11-10: qty 1

## 2023-11-10 NOTE — Patient Instructions (Signed)
Sodium Ferric Gluconate Complex Injection What is this medication? SODIUM FERRIC GLUCONATE COMPLEX (SOE dee um FER ik GLOO koe nate KOM pleks) treats low levels of iron (iron deficiency anemia) in people with kidney disease. Iron is a mineral that plays an important role in making red blood cells, which carry oxygen from your lungs to the rest of your body. This medicine may be used for other purposes; ask your health care provider or pharmacist if you have questions. COMMON BRAND NAME(S): Ferrlecit, Nulecit What should I tell my care team before I take this medication? They need to know if you have any of the following conditions: Anemia that is not from iron deficiency High levels of iron in the blood An unusual or allergic reaction to iron, other medications, foods, dyes, or preservatives Pregnant or are trying to become pregnant Breast-feeding How should I use this medication? This medication is injected into a vein. It is given by your care team in a hospital or clinic setting. Talk to your care team about the use of this medication in children. While it may be prescribed for children as young as 6 years for selected conditions, precautions do apply. Overdosage: If you think you have taken too much of this medicine contact a poison control center or emergency room at once. NOTE: This medicine is only for you. Do not share this medicine with others. What if I miss a dose? It is important not to miss your dose. Call your care team if you are unable to keep an appointment. What may interact with this medication? Do not take this medication with any of the following: Deferasirox Deferoxamine Dimercaprol This medication may also interact with the following: Other iron products This list may not describe all possible interactions. Give your health care provider a list of all the medicines, herbs, non-prescription drugs, or dietary supplements you use. Also tell them if you smoke, drink  alcohol, or use illegal drugs. Some items may interact with your medicine. What should I watch for while using this medication? Your condition will be monitored carefully while you are receiving this medication. Visit your care team for regular checks on your progress. You may need blood work while you are taking this medication. What side effects may I notice from receiving this medication? Side effects that you should report to your care team as soon as possible: Allergic reactions--skin rash, itching, hives, swelling of the face, lips, tongue, or throat Low blood pressure--dizziness, feeling faint or lightheaded, blurry vision Shortness of breath Side effects that usually do not require medical attention (report to your care team if they continue or are bothersome): Flushing Headache Joint pain Muscle pain Nausea Pain, redness, or irritation at injection site This list may not describe all possible side effects. Call your doctor for medical advice about side effects. You may report side effects to FDA at 1-800-FDA-1088. Where should I keep my medication? This medication is given in a hospital or clinic and will not be stored at home. NOTE: This sheet is a summary. It may not cover all possible information. If you have questions about this medicine, talk to your doctor, pharmacist, or health care provider.  2024 Elsevier/Gold Standard (2021-01-25 00:00:00)

## 2023-11-16 ENCOUNTER — Encounter: Payer: Self-pay | Admitting: Family Medicine

## 2023-11-16 ENCOUNTER — Telehealth: Payer: Self-pay | Admitting: Psychiatry

## 2023-11-16 NOTE — Telephone Encounter (Signed)
 Dr. Ahern:Former [facility] pt. Would like to be assigned with dr. Lucia Gaskins. Would you be willing to accommodate.   RxPA team:pa for qulipta needed

## 2023-11-16 NOTE — Telephone Encounter (Signed)
 Pt requesting to be assigned with Dr. Lucia Gaskins for migraines. Pt will be running out in a week. Pt said believe pharmacy needs PA from neurologist due to pharmacy waiting on something the provider for  Atogepant (QULIPTA) 10 MG TABS.   Patient request provider be a female.Marland Kitchen  [facility] [phone]

## 2023-11-17 ENCOUNTER — Ambulatory Visit: Payer: Commercial Managed Care - PPO

## 2023-11-17 ENCOUNTER — Telehealth: Payer: Self-pay

## 2023-11-17 ENCOUNTER — Encounter: Payer: Self-pay | Admitting: Oncology

## 2023-11-17 ENCOUNTER — Other Ambulatory Visit (HOSPITAL_COMMUNITY): Payer: Self-pay

## 2023-11-17 MED ORDER — QULIPTA 10 MG PO TABS: 1.0000 | ORAL_TABLET | Freq: Every day | ORAL | 1 refills | Status: DC

## 2023-11-17 NOTE — Telephone Encounter (Signed)
*  [facility]  Pharmacy Patient Advocate Encounter  Received notification from [facility] that Prior Authorization for Qulipta 10MG  tablets  has been APPROVED from 11/17/2023 to 02/15/2024. Ran test claim, Copay is $0.00. This test claim was processed through [facility]- copay amounts may vary at other pharmacies due to pharmacy/plan contracts, or as the patient moves through the different stages of their insurance plan.   PA #/Case ID/Reference #: [id number]   [employer], the mfr of QULIPTA 10 MG paid 272.95 toward your plan copay. $2952.84 out of $7000.00 in benefits remaining.

## 2023-11-17 NOTE — Telephone Encounter (Signed)
 Patient can follow up with NP for her migraines and of coiurse I will be here if needed thanks

## 2023-11-17 NOTE — Addendum Note (Signed)
 Addended by: Danne Harbor on: 11/17/2023 09:13 AM   Modules accepted: Orders

## 2023-11-17 NOTE — Telephone Encounter (Signed)
 Phone room: Please call and assist in getting scheduled w/np. Let pt know we sent refill and a request to pa team to start the pa

## 2023-11-17 NOTE — Telephone Encounter (Signed)
 PA request has been Approved. New Encounter has been or will be created for follow up. For additional info see Pharmacy Prior Auth telephone encounter from 03/04.

## 2023-11-24 ENCOUNTER — Inpatient Hospital Stay: Payer: Commercial Managed Care - PPO | Attending: Oncology

## 2023-11-24 ENCOUNTER — Other Ambulatory Visit: Payer: Self-pay | Admitting: Nurse Practitioner

## 2023-11-24 VITALS — BP 103/66 | HR 73 | Temp 99.7°F | Resp 18

## 2023-11-24 DIAGNOSIS — Z862 Personal history of diseases of the blood and blood-forming organs and certain disorders involving the immune mechanism: Secondary | ICD-10-CM

## 2023-11-24 DIAGNOSIS — D509 Iron deficiency anemia, unspecified: Secondary | ICD-10-CM | POA: Insufficient documentation

## 2023-11-24 MED ORDER — CETIRIZINE HCL 10 MG/ML IV SOLN
10.0000 mg | INTRAVENOUS | Status: DC
  Administered 2023-11-24: 10 mg via INTRAVENOUS
  Filled 2023-11-24: qty 1

## 2023-11-24 MED ORDER — SODIUM CHLORIDE 0.9 % IV SOLN
Freq: Once | INTRAVENOUS | Status: AC
  Filled 2023-11-24: qty 250

## 2023-11-24 MED ORDER — SODIUM CHLORIDE 0.9 % IV SOLN
125.0000 mg | INTRAVENOUS | Status: DC
  Administered 2023-11-24: 125 mg via INTRAVENOUS
  Filled 2023-11-24: qty 125

## 2023-11-24 MED ORDER — METHYLPREDNISOLONE SODIUM SUCC 125 MG IJ SOLR: 40.0000 mg | Freq: Once | INTRAMUSCULAR | Status: DC

## 2023-12-08 ENCOUNTER — Other Ambulatory Visit: Payer: Self-pay | Admitting: Oncology

## 2023-12-08 ENCOUNTER — Inpatient Hospital Stay: Payer: Commercial Managed Care - PPO

## 2023-12-08 VITALS — BP 99/66 | HR 70 | Temp 98.0°F | Resp 18

## 2023-12-08 DIAGNOSIS — Z862 Personal history of diseases of the blood and blood-forming organs and certain disorders involving the immune mechanism: Secondary | ICD-10-CM

## 2023-12-08 DIAGNOSIS — D509 Iron deficiency anemia, unspecified: Secondary | ICD-10-CM | POA: Diagnosis not present

## 2023-12-08 MED ORDER — SODIUM CHLORIDE 0.9 % IV SOLN
Freq: Once | INTRAVENOUS | Status: AC
  Filled 2023-12-08: qty 250

## 2023-12-08 MED ORDER — CETIRIZINE HCL 10 MG/ML IV SOLN
10.0000 mg | Freq: Once | INTRAVENOUS | Status: AC
  Administered 2023-12-08: 10 mg via INTRAVENOUS
  Filled 2023-12-08: qty 1

## 2023-12-08 MED ORDER — SODIUM CHLORIDE 0.9 % IV SOLN
125.0000 mg | Freq: Once | INTRAVENOUS | Status: AC
  Administered 2023-12-08: 125 mg via INTRAVENOUS
  Filled 2023-12-08: qty 125

## 2023-12-09 ENCOUNTER — Other Ambulatory Visit: Payer: Self-pay | Admitting: Family Medicine

## 2023-12-10 NOTE — Telephone Encounter (Signed)
 Requested medication (s) are due for refill today: Yes  Requested medication (s) are on the active medication list: Yes  Last refill:  08/10/23  Future visit scheduled: Yes  Notes to clinic:  Unable to refill due to no refill protocol for this medication.      Requested Prescriptions  Pending Prescriptions Disp Refills   CREON 12000-38000 units CPEP capsule [Pharmacy Med Name: CREON DR 12,000 UNIT CAPSULE] 1,100 capsule     Sig: TAKE 1-3 CAPSULES (12,000-36,000 UNITS TOTAL) BY MOUTH 3 (THREE) TIMES DAILY BEFORE MEALS. PATIENT MAY TAKE AN ADDITIONAL 3 CAPSULES PER DAY FOR SNACK.     Off-Protocol Failed - 12/10/2023  1:55 PM      Failed - Medication not assigned to a protocol, review manually.      Failed - Valid encounter within last 12 months    Recent Outpatient Visits   None     Future Appointments             In 1 week Bacigalupo, Marzella Schlein, MD [facility]   In 1 month Willeen Niece, MD [facility]   In 1 month Vanga, Loel Dubonnet, MD [facility]

## 2023-12-21 ENCOUNTER — Ambulatory Visit: Payer: Self-pay | Admitting: Family Medicine

## 2023-12-21 ENCOUNTER — Encounter: Payer: Self-pay | Admitting: Family Medicine

## 2023-12-21 VITALS — BP 117/73 | HR 70 | Resp 16 | Wt 142.6 lb

## 2023-12-21 DIAGNOSIS — Z1589 Genetic susceptibility to other disease: Secondary | ICD-10-CM | POA: Insufficient documentation

## 2023-12-21 DIAGNOSIS — R7301 Impaired fasting glucose: Secondary | ICD-10-CM

## 2023-12-21 DIAGNOSIS — Z87311 Personal history of (healed) other pathological fracture: Secondary | ICD-10-CM

## 2023-12-21 DIAGNOSIS — F411 Generalized anxiety disorder: Secondary | ICD-10-CM

## 2023-12-21 DIAGNOSIS — G4733 Obstructive sleep apnea (adult) (pediatric): Secondary | ICD-10-CM | POA: Diagnosis not present

## 2023-12-21 DIAGNOSIS — Z23 Encounter for immunization: Secondary | ICD-10-CM | POA: Diagnosis not present

## 2023-12-21 DIAGNOSIS — E7800 Pure hypercholesterolemia, unspecified: Secondary | ICD-10-CM

## 2023-12-21 DIAGNOSIS — G901 Familial dysautonomia [Riley-Day]: Secondary | ICD-10-CM

## 2023-12-21 DIAGNOSIS — M8589 Other specified disorders of bone density and structure, multiple sites: Secondary | ICD-10-CM

## 2023-12-21 DIAGNOSIS — R296 Repeated falls: Secondary | ICD-10-CM

## 2023-12-21 DIAGNOSIS — Z79899 Other long term (current) drug therapy: Secondary | ICD-10-CM

## 2023-12-21 DIAGNOSIS — D8940 Mast cell activation, unspecified: Secondary | ICD-10-CM

## 2023-12-21 DIAGNOSIS — E559 Vitamin D deficiency, unspecified: Secondary | ICD-10-CM

## 2023-12-21 DIAGNOSIS — L4050 Arthropathic psoriasis, unspecified: Secondary | ICD-10-CM

## 2023-12-21 MED ORDER — UBRELVY 50 MG PO TABS: 50.0000 mg | ORAL_TABLET | ORAL | 6 refills | Status: AC | PRN

## 2023-12-21 MED ORDER — OMEGA-3-ACID ETHYL ESTERS 1 G PO CAPS
1.0000 g | ORAL_CAPSULE | Freq: Every day | ORAL | 3 refills | Status: AC
Start: 2023-12-21 — End: ?

## 2023-12-21 NOTE — Progress Notes (Signed)
 Established patient visit   Patient: Autumn Zhang   DOB: 02-23-1971   53 y.o. Female  MRN: [id number] Visit Date: 12/21/2023  Today's healthcare provider: Shirlee Latch, MD   Chief Complaint  Patient presents with   Medical Management of Chronic Issues    Follow-up HDL   Subjective    HPI HPI     Medical Management of Chronic Issues    Additional comments: Follow-up HDL      Last edited by Marjie Skiff, CMA on 12/21/2023 10:33 AM.       Discussed the use of AI scribe software for clinical note transcription with the patient, who gave verbal consent to proceed.  History of Present Illness   The patient, with a history of Ehlers-Danlos syndrome, osteopenia, and mast cell activation syndrome, presents for a follow-up visit. She reports recent falls, which she attributes to clumsiness rather than dizziness. She expresses concern about the frequency of these falls, noting that they are events she would have recovered from without falling a decade ago. The patient also reports a wrist fracture from a fall seven weeks ago, which is still causing swelling and limited range of motion. She expresses dissatisfaction with her orthopedic care and is seeking physical therapy for rehabilitation.  The patient is also concerned about her medication regimen, specifically the anticholinergic properties of her medications. She expresses a desire to work with a Teacher, music or other professional to analyze these properties and potentially adjust her regimen.  The patient also discusses her diet, specifically her high intake of peanut butter, and expresses concern about potential cadmium intake. She is interested in follow-up lipid labs to assess the impact of her diet changes on her health.  The patient also requests a DEXA scan due to a recent fracture and history of osteopenia. She notes that this is her second fracture in a year and is concerned about her bone health.  She  also agrees to receive a pneumonia vaccine during the visit.         Medications: Outpatient Medications Prior to Visit  Medication Sig   Epinastine HCl 0.05 % ophthalmic solution Apply 1 drop to eye 2 (two) times daily.   acetaminophen (TYLENOL) 500 MG tablet Take 500 mg by mouth every 6 (six) hours as needed.   albuterol (PROVENTIL HFA;VENTOLIN HFA) 108 (90 Base) MCG/ACT inhaler Inhale 2 puffs into the lungs every 4 (four) hours as needed.    aspirin 325 MG tablet Take 325 mg by mouth. Every 3 days   Atogepant (QULIPTA) 10 MG TABS Take 1 tablet by mouth daily.   azelastine (ASTELIN) 0.1 % nasal spray Place into both nostrils as needed for rhinitis. Use in each nostril as directed   Blood Pressure Monitoring (BLOOD PRESSURE CUFF) MISC Take blood pressure as needed for symptoms.   Boswellia Serrata (BOSWELLIA PO) Take 150 mg by mouth every morning.    Calcium Citrate 200 MG TABS Take 600 mg by mouth. powder   cromolyn (GASTROCROM) 100 MG/5ML solution Take by mouth 4 (four) times daily -  before meals and at bedtime.   Cyanocobalamin (VITAMIN B-12) 1000 MCG SUBL Place 500 mcg under the tongue daily.   desloratadine (CLARINEX) 5 MG tablet Take 5 mg by mouth as needed.   Diclofenac Sodium 1.5 % SOLN 40 drops Externally Four times a day   diphenhydrAMINE-Allantoin 2-0.5 % CREA Apply 1 application topically as needed.    docusate sodium (COLACE) 100 MG capsule Take  100 mg by mouth every other day.    EPINEPHrine 0.3 mg/0.3 mL IJ SOAJ injection Inject 0.3 mg into the muscle as needed for anaphylaxis. INJECT ONE AUTO-INJECTOR INTRAMUSCULARLY AS NEEDED   estradiol (ESTRACE) 0.1 MG/GM vaginal cream INSERT 1 GRAM VAGINALLY 2  TIMES A WEEK   famotidine (PEPCID) 10 MG tablet Take 10 mg by mouth daily.    fexofenadine (ALLEGRA) 180 MG tablet Take 180 mg by mouth 2 (two) times daily.   Folic Acid (FOLATE PO) Take 0.5 tablets by mouth daily. Folate as calcium foliate 680 mcg DFE   guaifenesin (HUMIBID  E) 400 MG TABS tablet Take 400 mg by mouth as needed.   hydrOXYzine (ATARAX) 25 MG tablet Take 25 mg by mouth as needed.   IODINE EX Take 225 mcg by mouth. Every 3 days   KETAMINE HCL SL Place into right nostril as needed.   Ketotifen Fumarate POWD 0.5 mg by Does not apply route. 6-12 times a day   L-Lysine 500 MG CAPS Take by mouth 3 (three) times daily.   L-Theanine 100 MG CAPS Take 200 mg by mouth as needed.   levocetirizine (XYZAL) 2.5 MG/5ML solution as needed.   Light Mineral Oil-Mineral Oil 0.5-0.5 % EMUL Apply 1 application to eye daily.   lipase/protease/amylase (CREON) 12000-38000 units CPEP capsule TAKE 1-3 CAPSULES (12,000-36,000 UNITS TOTAL) BY MOUTH 3 (THREE) TIMES DAILY BEFORE MEALS. PATIENT MAY TAKE AN ADDITIONAL 3 CAPSULES PER DAY FOR SNACK.   loperamide (IMODIUM) 2 MG capsule Take by mouth as needed for diarrhea or loose stools.   Magnesium Citrate POWD Take 100 mg by mouth daily.   Melatonin 1 MG/ML LIQD Take 1 mg by mouth daily as needed.   Menatetrenone (VITAMIN K2) 100 MCG TABS 100 mcg daily.   montelukast (SINGULAIR) 5 MG chewable tablet Chew 10 mg by mouth daily at 2 PM.   Non Gelatin Capsules, Empty, (CAPSULE #3 CLEAR/CLEAR VEG) CAPS Take 1 capsule by mouth daily.   NONFORMULARY OR COMPOUNDED ITEM Bupivacaine nasal 0.5% drops w/o epi   PHOSPHATIDYL CHOLINE PO Take 1 tablet by mouth daily.   Phytonadione (VITAMIN K1) POWD 100 mcg daily.   PYRIDOSTIGMINE BROMIDE PO Take 7.5 mg by mouth 5 (five) times daily. 4-10 times per day   Riboflavin (VITAMIN B-2 PO) Take 100 mg by mouth 3 (three) times daily.   Selenium 200 MCG CAPS Take 1 capsule by mouth. Twice a month   simethicone (MYLICON) 80 MG chewable tablet Chew 80 mg by mouth every 6 (six) hours as needed for flatulence.   Sodium Fluoride 1.1 % PSTE Place onto teeth.   trolamine salicylate (ASPERCREME/ALOE) 10 % cream Apply topically.   vitamin C (ASCORBIC ACID) 500 MG tablet Take 500 mg by mouth daily. With calcium    Vitamin D, Cholecalciferol, 10 MCG (400 UNIT) TABS Take 1,000 Int'l Units/day by mouth daily.   ZORYVE 0.3 % CREA APPLY TO ELBOWS ONCE DAILY.   [DISCONTINUED] diphenhydrAMINE (BENADRYL) 12.5 MG/5ML liquid Take by mouth 4 (four) times daily as needed.   [DISCONTINUED] ketotifen (ZADITOR) 0.025 % ophthalmic solution Place 1 drop into both eyes daily as needed.   [DISCONTINUED] L-Lysine HCl 500 MG CAPS Take by mouth.   [DISCONTINUED] Lutein-Zeaxanthin 25-5 MG CAPS Take 1 capsule by mouth every other day.   [DISCONTINUED] omega-3 acid ethyl esters (LOVAZA) 1 g capsule TAKE 2 CAPSULES BY MOUTH 2 TIMES DAILY.   [DISCONTINUED] pseudoephedrine (SUDAFED) 30 MG tablet Take 30 mg by mouth every 4 (  four) hours as needed for congestion.    [DISCONTINUED] Ubrogepant (UBRELVY) 50 MG TABS Take 1 tablet (50 mg total) by mouth as needed (for migriane). May repeat a dose in 2 hours if headache persists   No facility-administered medications prior to visit.    Review of Systems     Objective    BP 117/73 (BP Location: Left Arm, Patient Position: Sitting, Cuff Size: Normal)   Pulse 70   Resp 16   Wt 142 lb 9.6 oz (64.7 kg)   SpO2 100%   BMI 26.08 kg/m    Physical Exam Vitals reviewed.  Constitutional:      General: She is not in acute distress.    Appearance: Normal appearance. She is well-developed. She is not diaphoretic.  Eyes:     General: No scleral icterus.    Conjunctiva/sclera: Conjunctivae normal.  Neck:     Thyroid: No thyromegaly.  Cardiovascular:     Rate and Rhythm: Normal rate and regular rhythm.     Heart sounds: Normal heart sounds. No murmur heard. Pulmonary:     Effort: Pulmonary effort is normal. No respiratory distress.     Breath sounds: Normal breath sounds. No wheezing, rhonchi or rales.  Musculoskeletal:     Cervical back: Neck supple.     Right lower leg: No edema.     Left lower leg: No edema.     Comments: No swelling currently, is present later in the day   Lymphadenopathy:     Cervical: No cervical adenopathy.  Skin:    General: Skin is warm and dry.  Neurological:     Mental Status: She is alert. Mental status is at baseline.  Psychiatric:        Mood and Affect: Mood normal.        Behavior: Behavior normal.      No results found for any visits on 12/21/23.  Assessment & Plan     Problem List Items Addressed This Visit       Respiratory   OSA on CPAP - Primary     Endocrine   Impaired fasting glucose   MTHFR mutation   Relevant Orders   RBC Folate     Nervous and Auditory   Dysautonomia (HCC)     Musculoskeletal and Integument   Osteopenia of multiple sites   Relevant Orders   DG Bone Density   Psoriatic arthritis (HCC)     Other   Polypharmacy (Chronic)   Relevant Orders   AMB Referral VBCI Care Management   Hypercholesterolemia   Relevant Medications   omega-3 acid ethyl esters (LOVAZA) 1 g capsule   Other Relevant Orders   Lipid Panel With LDL/HDL Ratio   Comprehensive metabolic panel with GFR   Apolipoprotein B   CRP High sensitivity   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HCC)   Vitamin D deficiency disease   GAD (generalized anxiety disorder)   Other Visit Diagnoses       History of fragility fracture       Relevant Orders   DG Bone Density     Frequent falls         Immunization due       Relevant Orders   Pneumococcal conjugate vaccine 20-valent (Prevnar 20) (Completed)           Mast Cell Activation Syndrome She experiences flares managed with cetirizine, famotidine, and Mestinon. She avoids anticholinergic medications and finds cetirizine effective during IV iron treatments. Emergency protocols include IV cetirizine, famotidine,  and Mestinon. - Document emergency protocol including IV cetirizine, famotidine, and Mestinon  Medication Management Her medication list was reviewed and updated, with adjustments to calcium, vitamin C, aspirin, vitamin B12, and folic acid. She aims to minimize  anticholinergic medications and uses cetirizine, famotidine, ibuprofen, and Mestinon for mast cell activation syndrome. She is interested in a clinical pharmacist's review for anticholinergic properties, a Medicare-covered service. - Update medication list with correct dosages and forms - Refer to clinical pharmacist for polypharmacy review  Frequent Falls She has experienced multiple falls attributed to clumsiness, possibly due to Ehlers-Danlos syndrome contributing to joint instability. Physical therapy focusing on stability and muscle strength may be beneficial. - Consider referral to physical therapy for stability and muscle strength training  Ehlers-Danlos Syndrome She has Ehlers-Danlos syndrome, which may contribute to joint instability and falls. Folate supplementation is beneficial due to an MTHFR variant associated with EDS. - Continue folate supplementation - Order RBC folate test during next lab work  Osteopenia She has osteopenia and experienced a fragility fracture in February 2025. A DEXA scan is indicated due to the recent fracture, to be performed on the same table as previous scans for accuracy. - Order DEXA scan at [facility]  Migraine Management She is on [place] for migraine management. Neurology has transferred medication management to the current provider. Refills for these medications will be sent, and prior authorization for Bernita Raisin will be ensured if needed. - Send refills for [place] - Ensure prior authorization for Bernita Raisin if needed  Lipid Management She has been making dietary changes to improve lipid levels. Follow-up lipid labs are due to assess the effectiveness of these changes. Kidney and liver function tests will also be conducted. - Order lipid panel, kidney, and liver function tests  Cardiac Risk Assessment Her cardiologist recommended repeating the C-reactive protein high sensitivity test for cardiac risk assessment.  Previous results showed normal risk. The apolipoprotein test will also be conducted. - Order C-reactive protein high sensitivity test - Order apolipoprotein test  Compression Garments She requires new compression garments for Ehlers-Danlos syndrome management. Previous fitting issues will be addressed by ordering two pairs of knee-high compression stockings and toe caps from [employer]. - Order two pairs of knee-high compression stockings and toe caps from [facility] Maintenance She is due for a Prevnar  20 vaccination, having received Prevnar 13 in 2019. She is interested in the updated vaccine for additional protection. - Administer Prevnar 20 vaccine  Follow-up She requires follow-up for health maintenance and management issues. An annual wellness visit is scheduled for October. Medications will be sent to [facility] and local pharmacy as appropriate. Lab work will be completed, and she will return for vaccination. - Schedule annual wellness visit for October - Send medications to [facility] and local pharmacy as appropriate - Complete lab work and return for vaccination       Return in about 6 months (around 06/21/2024) for AWV, chronic disease f/u, With PCP.      Total time spent on today's visit was greater than 40 minutes, including both face-to-face time and nonface-to-face time personally spent on review of chart (labs and imaging), discussing labs and goals, discussing further work-up, treatment options, answering patient's questions, and coordinating care.   Shirlee Latch, MD  [facility] [phone] (phone) [phone] (fax)  [facility]

## 2023-12-22 LAB — COMPREHENSIVE METABOLIC PANEL WITH GFR
ALT: 25 IU/L (ref 0–32)
AST: 28 IU/L (ref 0–40)
Albumin: 4.4 g/dL (ref 3.8–4.9)
Alkaline Phosphatase: 83 IU/L (ref 44–121)
BUN/Creatinine Ratio: 17 (ref 9–23)
BUN: 10 mg/dL (ref 6–24)
Bilirubin Total: 0.3 mg/dL (ref 0.0–1.2)
CO2: 25 mmol/L (ref 20–29)
Calcium: 9.9 mg/dL (ref 8.7–10.2)
Chloride: 102 mmol/L (ref 96–106)
Creatinine, Ser: 0.58 mg/dL (ref 0.57–1.00)
Globulin, Total: 2.5 g/dL (ref 1.5–4.5)
Glucose: 100 mg/dL — ABNORMAL HIGH (ref 70–99)
Potassium: 3.9 mmol/L (ref 3.5–5.2)
Sodium: 141 mmol/L (ref 134–144)
Total Protein: 6.9 g/dL (ref 6.0–8.5)
eGFR: 107 mL/min/{1.73_m2} (ref >59)

## 2023-12-22 LAB — HIGH SENSITIVITY CRP: CRP, High Sensitivity: 2.79 mg/L (ref 0.00–3.00)

## 2023-12-22 LAB — LIPID PANEL WITH LDL/HDL RATIO
Cholesterol, Total: 183 mg/dL (ref 100–199)
HDL: 62 mg/dL (ref >39)
LDL Chol Calc (NIH): 106 mg/dL — ABNORMAL HIGH (ref 0–99)
LDL/HDL Ratio: 1.7 ratio (ref 0.0–3.2)
Triglycerides: 84 mg/dL (ref 0–149)
VLDL Cholesterol Cal: 15 mg/dL (ref 5–40)

## 2023-12-22 LAB — FOLATE RBC
Folate, Hemolysate: 459 ng/mL
Folate, RBC: 1221 ng/mL (ref >498)
Hematocrit: 37.6 % (ref 34.0–46.6)

## 2023-12-22 LAB — APOLIPOPROTEIN B: Apolipoprotein B: 89 mg/dL (ref <90)

## 2023-12-25 ENCOUNTER — Encounter: Payer: Self-pay | Admitting: Family Medicine

## 2023-12-29 ENCOUNTER — Encounter: Payer: Self-pay | Admitting: Family Medicine

## 2024-01-04 ENCOUNTER — Telehealth: Payer: Self-pay

## 2024-01-04 ENCOUNTER — Ambulatory Visit: Payer: Self-pay

## 2024-01-04 NOTE — Telephone Encounter (Signed)
 Noted.

## 2024-01-04 NOTE — Progress Notes (Signed)
 Care Guide Pharmacy Note  01/04/2024 Name: Autumn Zhang MRN: [id number] DOB: 1971-01-06  Referred By: Mazie Speed, MD Reason for referral: Complex Care Management (Outreach to schedule with Pharm d )   Autumn Zhang is a 53 y.o. year old female who is a primary care patient of Bacigalupo, Angela M, MD.  Autumn Zhang was referred to the pharmacist for assistance related to:  Polypharmacy   Successful contact was made with the patient to discuss pharmacy services including being ready for the pharmacist to call at least 5 minutes before the scheduled appointment time and to have medication bottles and any blood pressure readings ready for review. The patient agreed to meet with the pharmacist via telephone visit on (date/time).01/21/2024  Lenton Rail , RMA     [place]  [name], [facility] Guide  Direct Dial: [phone]  Website: [email or web address]

## 2024-01-04 NOTE — Telephone Encounter (Signed)
 Chief Complaint: Hand Pain  Symptoms: Right hand pain, hand splinters Frequency: Constant onset 4-5 weeks ago Pertinent Negatives: Patient denies new injury, swelling  Disposition: [] ED /[] Urgent Care (no appt availability in office) / [x] Appointment(In office/virtual)/ []  [place] [facility]/ [] Home Care/ [] Refused Recommended Disposition /[] [place] [facility]/ []  Follow-up with PCP Additional Notes: Patient states she has hand pain that has continued since the removal of her cast after a fracture. Patient also reports what she believes to be fiberglass splinters in the hand as well. Patient states she has an ortho appointment scheduled but would like to have the splinters checked out before the appointment if possible. Care advice was given and patient has been scheduled for an appointment with PCP tomorrow morning.   Copied from CRM [id number]. Topic: Clinical - Red Word Triage >> Jan 04, 2024 11:16 AM Leory Rands wrote: Red Word that prompted transfer to Nurse Triage: Patient is calling to report pain with hand. Has fiberglass splinters in her hand since the end of February. Should she be worried about this? There is a spot under her cast Reason for Disposition  [1] MODERATE pain (e.g., interferes with normal activities) AND [2] present > 3 days  Answer Assessment - Initial Assessment Questions 1. ONSET: "When did the pain start?"     4-5 weeks  2. LOCATION: "Where is the pain located?"     Right hand  3. PAIN: "How bad is the pain?" (Scale 1-10; or mild, moderate, severe)   - MILD (1-3): doesn't interfere with normal activities   - MODERATE (4-7): interferes with normal activities (e.g., work or school) or awakens from sleep   - SEVERE (8-10): excruciating pain, unable to use hand at all     3/10 can shoot up to 8/10 4. WORK OR EXERCISE: "Has there been any recent work or exercise that involved this part (i.e., hand or wrist) of the body?"     No  5. CAUSE: "What do you think is  causing the pain?"    I broke the hand awhile pain  6. AGGRAVATING FACTORS: "What makes the pain worse?" (e.g., using computer)     Movement makes it worse  7. OTHER SYMPTOMS: "Do you have any other symptoms?" (e.g., neck pain, swelling, rash, numbness, fever)     Fiberglass splinters  8. PREGNANCY: "Is there any chance you are pregnant?" "When was your last menstrual period?"     N/A  Protocols used: Hand and Wrist Pain-A-AH

## 2024-01-05 ENCOUNTER — Ambulatory Visit
Admission: RE | Admit: 2024-01-05 | Discharge: 2024-01-05 | Disposition: A | Discharge status: Home / Self Care | Attending: Family Medicine | Admitting: Family Medicine

## 2024-01-05 ENCOUNTER — Encounter: Payer: Self-pay | Admitting: Family Medicine

## 2024-01-05 ENCOUNTER — Encounter: Payer: Self-pay | Admitting: Oncology

## 2024-01-05 ENCOUNTER — Ambulatory Visit (INDEPENDENT_AMBULATORY_CARE_PROVIDER_SITE_OTHER): Admitting: Family Medicine

## 2024-01-05 ENCOUNTER — Ambulatory Visit
Admission: RE | Admit: 2024-01-05 | Discharge: 2024-01-05 | Disposition: A | Discharge status: Home / Self Care | Source: Ambulatory Visit | Attending: Family Medicine | Admitting: Family Medicine

## 2024-01-05 VITALS — BP 100/64 | HR 72 | Ht 62.0 in | Wt 142.7 lb

## 2024-01-05 DIAGNOSIS — Z4789 Encounter for other orthopedic aftercare: Secondary | ICD-10-CM

## 2024-01-05 DIAGNOSIS — M795 Residual foreign body in soft tissue: Secondary | ICD-10-CM | POA: Diagnosis not present

## 2024-01-05 NOTE — Progress Notes (Signed)
 Acute visit   Patient: Autumn Zhang   DOB: [birth year]   53 y.o. Female  MRN: [id number] PCP: Mazie Speed, MD   Chief Complaint  Patient presents with   Hand Pain    Patient reports breaking arm Feburary 18, 2025 and has had ongoing hand pain since then that is not resolving as expected. Patient is seeing [facility] orthopedics next Tuesday but has some issues she would like to address before then. Patient reports pain is getting worse as well as losing function and strength. Patient is taking tylenol  and aspirin every other day with occasional naproxen also using topical ointments such as Bengay, lidocaine . Bengay is helpful to distract from the pain   GI Problem    Would love recommendations as her provider is now leaving   Subjective    Discussed the use of AI scribe software for clinical note transcription with the patient, who gave verbal consent to proceed.  History of Present Illness   The patient, with a known history of Ehlers-Danlos Syndrome (EDS), presents with ongoing issues related to a previous wrist fracture. She reports persistent pain, swelling, and numbness in the affected hand. The patient also expresses concern about suspected fiberglass splinters in the hand, which she believes were left behind from a previous splint. She describes the splinters as microscopic and difficult to remove, causing discomfort and concern about potential migration. The patient also reports a vein in the hand that is tender and visibly prominent.  In addition to these issues, the patient is experiencing difficulty with rehabilitation exercises due to her EDS. She reports that even minor movements can cause significant discomfort and instability in the hand. The patient has tried various methods for pain management, including bupivacaine  nasal drops applied topically, over-the-counter lidocaine , and Bengay. However, these measures have provided only limited relief.  The patient also mentions  a previous experience with a poorly fitted splint and brace, which had to be redone multiple times. This has led to a lack of trust in the medical advice she has received so far.        Review of Systems  Objective    BP 100/64 (BP Location: Left Arm, Patient Position: Sitting, Cuff Size: Normal)   Pulse 72   Ht 5\' 2"  (1.575 m)   Wt 142 lb 11.2 oz (64.7 kg)   SpO2 100%   BMI 26.10 kg/m  Physical Exam   Physical Exam   EXTREMITIES: Prominent and tender vein on hand. Hand and wrist swollen. K-Tape present on hand. Palpable fiberglass shard on hand, partially removed.      No results found for any visits on 01/05/24.  Assessment & Plan     Problem List Items Addressed This Visit   None Visit Diagnoses       Problem with fiberglass cast    -  Primary   Relevant Orders   DG Hand Complete Right   DG Wrist Complete Right     Foreign body (FB) in soft tissue       Relevant Orders   DG Hand Complete Right   DG Wrist Complete Right           Fracture of distal radius Fracture of the distal radius with associated bone pain in the ulna, swelling, bruising, and inability to bear weight. Numbness and nerve compression sensation, likely related to carpal tunnel syndrome. Previous use of full cast, brace, and splint with fitting issues. Physical therapy may have worsened the  condition due to Ehlers-Danlos syndrome (EDS). - Order x-ray of right hand and wrist to assess for fiberglass splinters and evaluate the fracture. - Consult with Dr. Windle Hatch to develop a rehabilitation plan balancing movement and stability, considering EDS. - Monitor numbness, which can persist up to six months post-injury.  Bone pain in ulna Bone pain in the ulna, likely due to the impact from the fall causing the distal radius fracture. The pain is expected and not indicative of a new injury.  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 Fiberglass is not radio-opaque, making detection on x-ray challenging, but may appear as dark white on imaging. Concern about splinters traveling through veins is unlikely. - Order x-ray of right hand and wrist to assess for fiberglass splinters. - Attempt to remove any visible splinters during the visit. - Advise using a paste of baking soda and water to draw out splinters. - Consult with Dr. Windle Hatch regarding the presence of fiberglass on x-rays.  Ehlers-Danlos syndrome Ehlers-Danlos syndrome contributing to joint instability and complicating rehabilitation for the distal radius fracture. Reports increased instability and issues with range of motion exercises. Passive range of motion is typically discouraged in EDS, while active range of motion is preferred to control the endpoint. - Consult with Dr. Windle Hatch to develop a rehabilitation plan considering EDS and avoiding exacerbation of instability.       No orders of the defined types were placed in this encounter.    Return if symptoms worsen or fail to improve.      Aden Agreste, MD  [facility] [phone] (phone) [phone] (fax)  [facility]

## 2024-01-06 ENCOUNTER — Encounter: Payer: Self-pay | Admitting: Family Medicine

## 2024-01-16 ENCOUNTER — Encounter: Payer: Self-pay | Admitting: Family Medicine

## 2024-01-18 ENCOUNTER — Telehealth: Payer: Self-pay

## 2024-01-18 ENCOUNTER — Other Ambulatory Visit: Payer: Self-pay

## 2024-01-18 DIAGNOSIS — M8589 Other specified disorders of bone density and structure, multiple sites: Secondary | ICD-10-CM

## 2024-01-18 DIAGNOSIS — Z87311 Personal history of (healed) other pathological fracture: Secondary | ICD-10-CM

## 2024-01-18 NOTE — Telephone Encounter (Signed)
 Patient sent the following message through mychart  I was notified that my prior authorization for [employer]  expires in 30 days. Could you send in a prior authorization request to [employer] for that?   Thank you

## 2024-01-19 ENCOUNTER — Encounter: Payer: Self-pay | Admitting: Dermatology

## 2024-01-19 ENCOUNTER — Encounter: Payer: Self-pay | Admitting: Oncology

## 2024-01-19 ENCOUNTER — Ambulatory Visit: Payer: Medicare Other | Admitting: Dermatology

## 2024-01-19 ENCOUNTER — Other Ambulatory Visit (HOSPITAL_COMMUNITY): Payer: Self-pay

## 2024-01-19 ENCOUNTER — Telehealth: Payer: Self-pay

## 2024-01-19 DIAGNOSIS — L905 Scar conditions and fibrosis of skin: Secondary | ICD-10-CM | POA: Diagnosis not present

## 2024-01-19 DIAGNOSIS — L918 Other hypertrophic disorders of the skin: Secondary | ICD-10-CM | POA: Diagnosis not present

## 2024-01-19 DIAGNOSIS — W908XXA Exposure to other nonionizing radiation, initial encounter: Secondary | ICD-10-CM

## 2024-01-19 DIAGNOSIS — D2271 Melanocytic nevi of right lower limb, including hip: Secondary | ICD-10-CM

## 2024-01-19 DIAGNOSIS — Z1283 Encounter for screening for malignant neoplasm of skin: Secondary | ICD-10-CM

## 2024-01-19 DIAGNOSIS — D2272 Melanocytic nevi of left lower limb, including hip: Secondary | ICD-10-CM

## 2024-01-19 DIAGNOSIS — Z86018 Personal history of other benign neoplasm: Secondary | ICD-10-CM

## 2024-01-19 DIAGNOSIS — Z85828 Personal history of other malignant neoplasm of skin: Secondary | ICD-10-CM

## 2024-01-19 DIAGNOSIS — L409 Psoriasis, unspecified: Secondary | ICD-10-CM

## 2024-01-19 DIAGNOSIS — L814 Other melanin hyperpigmentation: Secondary | ICD-10-CM

## 2024-01-19 DIAGNOSIS — D229 Melanocytic nevi, unspecified: Secondary | ICD-10-CM

## 2024-01-19 DIAGNOSIS — L578 Other skin changes due to chronic exposure to nonionizing radiation: Secondary | ICD-10-CM

## 2024-01-19 DIAGNOSIS — L821 Other seborrheic keratosis: Secondary | ICD-10-CM

## 2024-01-19 DIAGNOSIS — D1801 Hemangioma of skin and subcutaneous tissue: Secondary | ICD-10-CM

## 2024-01-19 DIAGNOSIS — D2262 Melanocytic nevi of left upper limb, including shoulder: Secondary | ICD-10-CM

## 2024-01-19 NOTE — Patient Instructions (Addendum)
 Cryotherapy Aftercare  Wash gently with soap and water everyday.   Apply Vaseline and Band-Aid daily until healed.     Continue Zoryve  cream once a day to affected areas. Continue Vanicream medicated shampoo    Recommend daily broad spectrum sunscreen SPF 30+ to sun-exposed areas, reapply every 2 hours as needed. Call for new or changing lesions.  Staying in the shade or wearing long sleeves, sun glasses (UVA+UVB protection) and wide brim hats (4-inch brim around the entire circumference of the hat) are also recommended for sun protection.      Melanoma ABCDEs  Melanoma is the most dangerous type of skin cancer, and is the leading cause of death from skin disease.  You are more likely to develop melanoma if you: Have light-colored skin, light-colored eyes, or red or blond hair Spend a lot of time in the sun Tan regularly, either outdoors or in a tanning bed Have had blistering sunburns, especially during childhood Have a close family member who has had a melanoma Have atypical moles or large birthmarks  Early detection of melanoma is key since treatment is typically straightforward and cure rates are extremely high if we catch it early.   The first sign of melanoma is often a change in a mole or a new dark spot.  The ABCDE system is a way of remembering the signs of melanoma.  A for asymmetry:  The two halves do not match. B for border:  The edges of the growth are irregular. C for color:  A mixture of colors are present instead of an even brown color. D for diameter:  Melanomas are usually (but not always) greater than 6mm - the size of a pencil eraser. E for evolution:  The spot keeps changing in size, shape, and color.  Please check your skin once per month between visits. You can use a small mirror in front and a large mirror behind you to keep an eye on the back side or your body.   If you see any new or changing lesions before your next follow-up, please call to schedule  a visit.  Please continue daily skin protection including broad spectrum sunscreen SPF 30+ to sun-exposed areas, reapplying every 2 hours as needed when you're outdoors.   Staying in the shade or wearing long sleeves, sun glasses (UVA+UVB protection) and wide brim hats (4-inch brim around the entire circumference of the hat) are also recommended for sun protection.      Due to recent changes in healthcare laws, you may see results of your pathology and/or laboratory studies on MyChart before the doctors have had a chance to review them. We understand that in some cases there may be results that are confusing or concerning to you. Please understand that not all results are received at the same time and often the doctors may need to interpret multiple results in order to provide you with the best plan of care or course of treatment. Therefore, we ask that you please give us  2 business days to thoroughly review all your results before contacting the office for clarification. Should we see a critical lab result, you will be contacted sooner.   If You Need Anything After Your Visit  If you have any questions or concerns for your doctor, please call our main line at [phone] and press option 4 to reach your doctor's medical assistant. If no one answers, please leave a voicemail as directed and we will return your call as soon as possible. Messages  left after 4 pm will be answered the following business day.   You may also send us  a message via MyChart. We typically respond to MyChart messages within 1-2 business days.  For prescription refills, please ask your pharmacy to contact our office. Our fax number is [phone].  If you have an urgent issue when the clinic is closed that cannot wait until the next business day, you can page your doctor at the number below.    Please note that while we do our best to be available for urgent issues outside of office hours, we are not available 24/7.    If you have an urgent issue and are unable to reach us , you may choose to seek medical care at your doctor's office, retail clinic, urgent care center, or emergency room.  If you have a medical emergency, please immediately call 911 or go to the emergency department.  Pager Numbers  - Dr. Bary Likes: [phone]  - Dr. Annette Barters: [phone]  - Dr. Felipe Horton: [phone]   In the event of inclement weather, please call our main line at [phone] for an update on the status of any delays or closures.  Dermatology Medication Tips: Please keep the boxes that topical medications come in in order to help keep track of the instructions about where and how to use these. Pharmacies typically print the medication instructions only on the boxes and not directly on the medication tubes.   If your medication is too expensive, please contact our office at [phone] option 4 or send us  a message through MyChart.   We are unable to tell what your co-pay for medications will be in advance as this is different depending on your insurance coverage. However, we may be able to find a substitute medication at lower cost or fill out paperwork to get insurance to cover a needed medication.   If a prior authorization is required to get your medication covered by your insurance company, please allow us  1-2 business days to complete this process.  Drug prices often vary depending on where the prescription is filled and some pharmacies may offer cheaper prices.  The website www.goodrx.com contains coupons for medications through different pharmacies. The prices here do not account for what the cost may be with help from insurance (it may be cheaper with your insurance), but the website can give you the price if you did not use any insurance.  - You can print the associated coupon and take it with your prescription to the pharmacy.  - You may also stop by our office during regular business hours and pick up a  GoodRx coupon card.  - If you need your prescription sent electronically to a different pharmacy, notify our office through [facility] or by phone at [phone] option 4.     Si Usted Necesita Algo Despus de Su Visita  Tambin puede enviarnos un mensaje a travs de Clinical cytogeneticist. Por lo general respondemos a los mensajes de MyChart en el transcurso de 1 a 2 das hbiles.  Para renovar recetas, por favor pida a su farmacia que se ponga en contacto con nuestra oficina. Franz Jacks de fax es Dasher [phone].  Si tiene un asunto urgente cuando la clnica est cerrada y que no puede esperar hasta el siguiente da hbil, puede llamar/localizar a su doctor(a) al nmero que aparece a continuacin.   Por favor, tenga en cuenta que aunque hacemos todo lo posible para estar disponibles para asuntos urgentes fuera del horario de [place], no  estamos disponibles las 24 horas del da, los 7 [address]  [address] de la [place].   Si tiene un problema urgente y no puede comunicarse con nosotros, puede optar por buscar atencin mdica  en el consultorio de su doctor(a), en una clnica privada, en un centro de atencin urgente o en una sala de emergencias.  Si tiene Engineer, drilling, por favor llame inmediatamente al 911 o vaya a la sala de emergencias.  Nmeros de bper  - Dr. Bary Likes: [phone]  - Dra. Annette Barters: [phone]  - Dr. Felipe Horton: [phone]   En caso de inclemencias del tiempo, por favor llame a Lajuan Pila principal al [phone] para una actualizacin sobre el [place] de cualquier retraso o cierre.  Consejos para la medicacin en dermatologa: Por favor, guarde las cajas en las que vienen los medicamentos de uso tpico para ayudarle a seguir las instrucciones sobre dnde y cmo usarlos. Las farmacias generalmente imprimen las instrucciones del medicamento slo en las cajas y no directamente en los tubos del [place].   Si su medicamento es muy caro, por favor, pngase en contacto  con Bettyjane Brunet llamando al [phone] y presione la opcin 4 o envenos un mensaje a travs de Clinical cytogeneticist.   No podemos decirle cul ser su copago por los medicamentos por adelantado ya que esto es diferente dependiendo de la cobertura de su seguro. Sin embargo, es posible que podamos encontrar un medicamento sustituto a Audiological scientist un formulario para que el seguro cubra el medicamento que se considera necesario.   Si se requiere una autorizacin previa para que su compaa de seguros [place] su medicamento, por favor permtanos de 1 a 2 das hbiles para completar este proceso.  Los precios de los medicamentos varan con frecuencia dependiendo del Environmental consultant de dnde se surte la receta y alguna farmacias pueden ofrecer precios ms baratos.  El sitio web www.goodrx.com tiene cupones para medicamentos de Health and safety inspector. Los precios aqu no tienen en cuenta lo que podra costar con la ayuda del seguro (puede ser ms barato con su seguro), pero el sitio web puede darle el precio si no utiliz Tourist information centre manager.  - Puede imprimir el cupn correspondiente y llevarlo con su receta a la farmacia.  - Tambin puede pasar por nuestra oficina durante el horario de atencin regular y Education officer, museum una tarjeta de cupones de GoodRx.  - Si necesita que su receta se enve electrnicamente a una farmacia diferente, informe a nuestra oficina a travs de MyChart de [place] o por telfono llamando al [phone] y presione la opcin 4.

## 2024-01-19 NOTE — Telephone Encounter (Signed)
 Pharmacy Patient Advocate Encounter   Received notification from CoverMyMeds that prior authorization for Qulipta  10MG  tablets is required/requested.   Insurance verification completed.   The patient is insured through [facility] .   Per test claim: PA required; PA started via CoverMyMeds. KEY [id number] . Waiting for clinical questions to populate.

## 2024-01-19 NOTE — Progress Notes (Signed)
 Follow-Up Visit   Subjective  Autumn Zhang is a 53 y.o. female who presents for the following: Skin Cancer Screening and Full Body Skin Exam. Hx of BCC, Hx of dysplastic nevus.   Recheck psoriasis on scalp. Using Zoryve  on affected body areas. States she is using Vanicream shampoo on scalp.   Lesion under right arm. Has been bleeding. Gets irritated.  C/O fiberglass splinters in right hand. H/O broken arm. Splint was not covered well.   The patient presents for Total-Body Skin Exam (TBSE) for skin cancer screening and mole check. The patient has spots, moles and lesions to be evaluated, some may be new or changing and the patient may have concern these could be cancer.    The following portions of the chart were reviewed this encounter and updated as appropriate: medications, allergies, medical history  Review of Systems:  No other skin or systemic complaints except as noted in HPI or Assessment and Plan.  Objective  Well appearing patient in no apparent distress; mood and affect are within normal limits.  A full examination was performed including scalp, head, eyes, ears, nose, lips, neck, chest, axillae, abdomen, back, buttocks, bilateral upper extremities, bilateral lower extremities, hands, feet, fingers, toes, fingernails, and toenails. All findings within normal limits unless otherwise noted below.   Relevant physical exam findings are noted in the Assessment and Plan.    Assessment & Plan   SKIN CANCER SCREENING PERFORMED TODAY.  HISTORY OF BASAL CELL CARCINOMA OF THE SKIN - Well healed scar of the right calf with no evidence of recurrence. With adjacent brown macule c/w nevus, photo compared, no change. Left chest, right forearm. - Recommend regular full body skin exams - Recommend daily broad spectrum sunscreen SPF 30+ to sun-exposed areas, reapply every 2 hours as needed.  - Call if any new or changing lesions are noted between office visits  HISTORY OF DYSPLASTIC  NEVI. Right lateral back. 05/28/2009. Left lower calf, moderate to severe, re-shave, margins free 03/16/2023. No evidence of recurrence today Recommend regular full body skin exams Recommend daily broad spectrum sunscreen SPF 30+ to sun-exposed areas, reapply every 2 hours as needed.  Call if any new or changing lesions are noted between office visits   ACTINIC DAMAGE - Chronic condition, secondary to cumulative UV/sun exposure - diffuse scaly erythematous macules with underlying dyspigmentation - Recommend daily broad spectrum sunscreen SPF 30+ to sun-exposed areas, reapply every 2 hours as needed.  - Staying in the shade or wearing long sleeves, sun glasses (UVA+UVB protection) and wide brim hats (4-inch brim around the entire circumference of the hat) are also recommended for sun protection.  - Call for new or changing lesions.  LENTIGINES, SEBORRHEIC KERATOSES, HEMANGIOMAS - Benign normal skin lesions - Benign-appearing - Call for any changes  MELANOCYTIC NEVI - Tan-brown and/or pink-flesh-colored symmetric macules and papules - multiple brown macules of the bilateral feet, left posterior shoulder, left calf. Photos compared from 12/31/2021 and 07/08/2022 no changes - 0.5 cm brown papule darker inferior at R 4th dorsal toe  - Benign appearing on exam today - Observation - Call clinic for new or changing moles - Recommend daily use of broad spectrum spf 30+ sunscreen to sun-exposed areas.    PSORIASIS Exam: Mild scaling at scalp, erythema at elbows. 2% BSA.   Chronic and persistent condition with duration or expected duration over one year. Condition is improving with treatment but not currently at goal.    Psoriasis is a chronic non-curable, but treatable  genetic/hereditary disease that may have other systemic features affecting other organ systems such as joints (Psoriatic Arthritis). It is associated with an increased risk of inflammatory bowel disease, heart disease, non-alcoholic  fatty liver disease, and depression.  Treatments include light and laser treatments; topical medications; and systemic medications including oral and injectables.   Treatment Plan: Continue Zoryve  cream once a day to affected areas. Continue Vanicream medicated shampoo to scalp Pt not able to use steroid cream due to high cortisol condition.   IRRITATED SKIN TAGS (Acrochordons) - Erythematous fleshy, skin-colored pedunculated papule - Benign appearing.  - Symptomatic, irritating, patient would like treated. - Prior to procedure, discussed risks of blister formation, small wound, skin dyspigmentation, or rare scar following treatment. Recommend Vaseline ointment to treated areas while healing. - Discussed risks (permanent scarring, infection, pain, bleeding, bruising, redness, and recurrence of the lesion) and benefits of the procedure, as well as the alternatives.  Informed consent was obtained.  Destruction Procedure Note Destruction method: cryotherapy   Informed consent: discussed and consent obtained   Lesion destroyed using liquid nitrogen: Yes   Outcome: patient tolerated procedure well with no complications   Post-procedure details: wound care instructions given   Locations: right lower axilla # of Lesions Treated: 1     Return in about 1 year (around 01/18/2025) for TBSE, HxBCC, HxDN.  I, Jill Parcell, CMA, am acting as scribe for Artemio Larry, MD.   Documentation: I have reviewed the above documentation for accuracy and completeness, and I agree with the above.  Artemio Larry, MD

## 2024-01-20 ENCOUNTER — Encounter: Payer: Self-pay | Admitting: Gastroenterology

## 2024-01-20 ENCOUNTER — Ambulatory Visit (INDEPENDENT_AMBULATORY_CARE_PROVIDER_SITE_OTHER): Admitting: Gastroenterology

## 2024-01-20 ENCOUNTER — Other Ambulatory Visit: Payer: Self-pay

## 2024-01-20 VITALS — BP 115/78 | HR 68 | Temp 98.1°F | Ht 62.0 in | Wt 141.5 lb

## 2024-01-20 DIAGNOSIS — R12 Heartburn: Secondary | ICD-10-CM

## 2024-01-20 DIAGNOSIS — Z85038 Personal history of other malignant neoplasm of large intestine: Secondary | ICD-10-CM | POA: Diagnosis not present

## 2024-01-20 DIAGNOSIS — Z860101 Personal history of adenomatous and serrated colon polyps: Secondary | ICD-10-CM

## 2024-01-20 DIAGNOSIS — K5909 Other constipation: Secondary | ICD-10-CM | POA: Diagnosis not present

## 2024-01-20 DIAGNOSIS — Z87891 Personal history of nicotine dependence: Secondary | ICD-10-CM

## 2024-01-20 DIAGNOSIS — K219 Gastro-esophageal reflux disease without esophagitis: Secondary | ICD-10-CM

## 2024-01-20 NOTE — Progress Notes (Signed)
 Autumn Oz, MD [address]  [address]  [place], [place] [address]  Main: [phone]  Fax: [phone]    Gastroenterology Consultation  Referring Provider:     Mazie Speed, MD Primary Care Physician:  Mazie Speed, MD Primary Gastroenterologist:  Dr. Karma Zhang Reason for Consultation: Flareup of reflux, recent diarrhea and now constipation        HPI:   Autumn Zhang is a 53 y.o. Caucasian female referred by Dr. David Escort, Stan Eans, MD  for consultation & management of constellation of upper and lower GI symptoms.  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  She underwent right hemicolectomy which revealed poorly differentiated invasive adenocarcinoma T3N0. She received 12 cycles of adjuvant chemotherapy with 5-FU leucovorin. 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 her father died of colon cancer at the age of 55.  Her only brother has also had colon polyps.  She has therefore been managed as a Lynch syndrome patient despite not having genetic features suggestive of Lynch syndrome.  She took prophylactic aspirin for a while and then she stopped taking it.  She has been getting colonoscopies every 1 to 2 years.  Her last colonoscopy was in 2018 at outside hospital.  Patient has history of Ehlers-Danlos syndrome.  Currently, patient is going through migraine attack and waiting glasses and he hat.  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  Moved from [place]  to [place]  and here to establish care for her ongoing GI symptoms.  Previously, she saw Dr. Bernarda Bride at [facility], Dr Maryagnes Small at [facility] in [place] .  Her GI symptoms include pelvic floor dyssynergia, chronic constipation although having several bowel movements daily with incomplete emptying, nonbloody, abdominal bloating.  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  She also underwent smart pill study at [facility] in 08/2018 which revealed normal small bowel transit time.  She underwent pelvic floor PT in [place] and did not find it helpful.  Her most concerning symptom today is frequent soft bowel movements anywhere from 5 to 6/day, nonbloody associated with significant abdominal bloating.  She was told that she had considered based on the imaging studies and therefore tried linaclotide in the past and did not tolerate it well.   She has history of heartburn, epigastric pain, regurgitation which are manageable.  Her weight has been stable.  She underwent nuclear medicine gastric emptying study which was normal in 03/2018.  Patient is also here to discuss about surveillance colonoscopy given her history of colon cancer and treating as Lynch syndrome.  She reports undergoing surveillance EGD in 2016 which was reportedly normal.  Patient reports that she has history of iron  deficiency anemia but her most recent labs returned unremarkable including normal iron  studies.  She takes iron , B12, vitamin C, calcium and vitamin D .  She has history of mast cell activation syndrome for which she takes cromolyn  sodium and scheduled to see allergy immunology at [facility].  Patient reports that she has several intolerances and allergies to medications and is very careful about trying new medication.  Follow-up visit 11/02/2018 She underwent colonoscopy which revealed diminutive tubular adenoma only.  Otherwise it was unremarkable.  Today, she wanted to discuss about  chronic left upper abdominal pain that she has been dealing with for last 2 to 3 years.  She sees eating fatty foods triggers the pain, sometimes laying on the left side makes the pain worse.  She states that is mild discomfort, nagging, more of an annoyance than anything.  The pain does not  interfere with her daily life.  She had cross-sectional imaging which was unremarkable.  She also had EGD which was unremarkable.  She would like to know if there is anything that she could try.  She tried Amitiza 8 MCG 2 times a day which resulted in constipation.  She is taking Dulcolax as needed.  She brings in with her a stool diary where she reports that her stools are variable consistency, sometimes dark, restarted oral iron .  Her weight has been stable.  She is currently on amitriptyline  10 mg at bedtime.  She would like to know if she has fat malabsorption because she notices that sometimes her stools float in the toilet bowl and she has significant fat craving.  She said she had pancreatic fecal elastase done in the past and it was negative.  Follow-up video visit 03/04/2019 Autumn Zhang reports that she has tried low-dose Creon  and Zenpep  which resulted in massive reactivation.  Therefore, she stopped taking pancreatic enzymes.  She continues to have mild degree of chronic left upper quadrant discomfort.  Her diet consists of mostly blended, liquid food.  She is trying to eat chicken but not able to tolerate it well.  Her weight has been stable.  She continues to have increased bowel frequency. She is currently on amitriptyline  10 mg daily.  She also has appointment with [facility] that she is planning to keep it.  She did not see the dietitian at [facility] referred by her allergy specialist. 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  She could not open her mouth wide enough to fit even the pediatric bite block.  Follow-up visit 07/16/2021 Patient is here for follow-up of her GI symptoms.  She was followed by Dr. Cathe Clore at [facility] for 2 years.  She decided to stay local and made a follow-up appointment to see me today. Patient has stayed on low-dose Creon .  She reports that even though her stools are loose and frequent, she has constant feeling of incomplete emptying.  She  is not able to lose weight even though her calorie restriction is between 1600 to 1700 cal/day.  She reports severe abdominal distention.  She does report left-sided abdominal discomfort.  She also wants to undergo colonoscopy for surveillance of colon cancer.  Her most recent labs are unremarkable  Follow-up visit 10/22/2022 Patient is here for follow-up of various GI symptoms.  She was seen by Dr. Annette Killings at [facility] and she was recommended to undergo colonoscopy every 3 years.  Patient wants to know what she could take when she has episodes of gastroparesis.  Discussed about Reglan.  She is managing pancreatic insufficiency well with enzyme replacement therapy.  Follow-up visit 01/20/2024 Autumn Zhang is here to discuss about recent flareup of reflux.  She is taking Pepcid  10 mg daily with mild relief.  She is needing to take NSAIDs for pain control while recovering from the fracture of her left hand.  She would like to know about other acid suppressive medications that she could try.  She also stated that recently she had diarrhea which has improved.  She is managing constipation which  is a chronic problem for her.  And insoluble fiber makes her constipation worse.  She is taking soluble fiber, takes Colace or magnesium citrate as needed.  She would like to know what other options she could try.  She did not tolerate Linzess in the past.  She does not smoke or drink alcohol  NSAIDs: None  Antiplts/Anticoagulants/Anti thrombotics: None  GI Procedures:  Colonoscopy 08/14/2021 - Patent end- to- side ileo- colonic anastomosis, characterized by healthy appearing mucosa. - The examined portion of the ileum was normal. - The entire examined colon is normal. - The distal rectum and anal verge are normal on retroflexion view. - No specimens collected.  Colonoscopy in 2018 by Dr Dann Dust   Colonoscopy 10/07/2018 - Patent end-to-side ileo-colonic anastomosis, characterized by healthy appearing mucosa. - Two  diminutive polyps in the transverse colon, removed with a cold biopsy forceps. Resected and retrieved. - The distal rectum and anal verge are normal on retroflexion view. - The examination was otherwise normal. - The examined portion of the ileum was normal.  DIAGNOSIS:  A.  COLON POLYP X2, TRANSVERSE; COLD BIOPSY:  - TUBULAR ADENOMA (1).  - COLONIC MUCOSA WITH FOCAL LYMPHOID AGGREGATE (1).  - NEGATIVE FOR HIGH-GRADE DYSPLASIA AND MALIGNANCY.   Past Medical History:  Diagnosis Date   Allergy    Anemia    Anxiety    Arthritis    Asthma    Attention deficit hyperactivity disorder (ADHD), combined type 10/18/2015   Diagnosed many years ago.  Has been doing well on Ritalin  5mg  QID to six times a day. Long acting hasn't worked well in the past and she has not wanted to try Adderall. She is taking wellbutrin as well, but she has been weaning medication due to SE migraine.  She is now down to Wellbutrin one quarter tablet daily.  Last Assessment & Plan:  A/P: Stable.  Continue current dosing.  Refilled 3 months   Basal cell carcinoma 05/16/2021   Superficial, R post lower leg. [facility] [place], [place]   Basal cell carcinoma 09/03/2006   L chest, R forearm. 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   Dysautonomia (HCC)    Dysplastic nevus 05/28/2009   right lateral back, [facility] [place], [place]   Dysplastic nevus 01/13/2023   left lower calf, mod to severe, shave removal 03/16/23, margins free 03/16/23   Ehlers-Danlos syndrome type III    Family history of colon cancer    GERD (gastroesophageal reflux disease)    Hyperlipidemia    Idiopathic small fiber peripheral neuropathy    Leiomyoma    Mast cell activation (HCC)    Migraines    Neck pain    Occipital neuralgia    Sleep apnea      Past Surgical History:  Procedure Laterality Date   APPENDECTOMY     removed with colectomy   BREAST SURGERY     reduction   COLON SURGERY  2008   for colon cancer, ~1/3 of colon removed, primary reanastimosis   COLONOSCOPY WITH PROPOFOL  N/A 10/07/2018   Procedure: COLONOSCOPY WITH PROPOFOL ;  Surgeon: Selena Daily, MD;  Location: [facility] ENDOSCOPY;  Service: Gastroenterology;  Laterality: N/A;   COLONOSCOPY WITH PROPOFOL  N/A 08/14/2021   Procedure: COLONOSCOPY WITH PROPOFOL ;  Surgeon: Selena Daily, MD;  Location: [facility] ENDOSCOPY;  Service: Gastroenterology;  Laterality: N/A;   ENDOMETRIAL ABLATION     trigeminal stimulator      Current Outpatient Medications:    acetaminophen  (TYLENOL ) 500 MG tablet, Take 500 mg by mouth every 6 (six) hours as needed., Disp: , Rfl:    albuterol  (PROVENTIL  HFA;VENTOLIN  HFA) 108 (90 Base) MCG/ACT inhaler, Inhale 2 puffs into the lungs every 4 (four) hours as needed. , Disp: , Rfl:    aspirin 325 MG tablet, Take 325 mg by mouth. Every 3 days, Disp: , Rfl:    Atogepant  (QULIPTA ) 10 MG TABS, Take 1 tablet by mouth daily., Disp: 90 tablet, Rfl: 1   azelastine (ASTELIN) 0.1 % nasal spray, Place into both nostrils as needed for rhinitis. Use in each nostril as directed, Disp: , Rfl:    Blood Pressure Monitoring (BLOOD PRESSURE CUFF) MISC, Take blood pressure as needed for symptoms., Disp: , Rfl:    Boswellia Serrata (BOSWELLIA PO), Take 150 mg by mouth every morning. , Disp: , Rfl:    Calcium Citrate 200 MG TABS, Take 600 mg by mouth. powder, Disp: , Rfl:    cromolyn  (GASTROCROM ) 100 MG/5ML solution, Take by mouth 4 (four) times daily -  before meals and at bedtime., Disp: , Rfl:    Cyanocobalamin (VITAMIN B-12) 1000 MCG SUBL, Place 500 mcg under the tongue daily., Disp: , Rfl:    desloratadine  (CLARINEX ) 5 MG tablet, Take 5 mg by mouth as needed., Disp: , Rfl:    Diclofenac Sodium 1.5 % SOLN, 40 drops Externally Four times a day, Disp: , Rfl:     diphenhydrAMINE -Allantoin 2-0.5 % CREA, Apply 1 application topically as needed. , Disp: , Rfl:    docusate sodium (COLACE) 100 MG capsule, Take 100 mg by mouth every other day. , Disp: , Rfl:    Epinastine HCl 0.05 % ophthalmic solution, Apply 1 drop to eye 2 (two) times daily., Disp: , Rfl:    EPINEPHrine  0.3 mg/0.3 mL IJ SOAJ injection, Inject 0.3 mg into the muscle as needed for anaphylaxis. INJECT ONE AUTO-INJECTOR INTRAMUSCULARLY AS NEEDED, Disp: 2 each, Rfl: 5   estradiol  (ESTRACE ) 0.1 MG/GM vaginal cream, INSERT 1 GRAM VAGINALLY 2  TIMES A WEEK, Disp: 42.5 g, Rfl: 2   famotidine  (PEPCID ) 10 MG tablet, Take 10 mg by mouth daily. , Disp: , Rfl:    fexofenadine (ALLEGRA) 180 MG tablet, Take 180 mg by mouth 2 (two) times daily., Disp: , Rfl:    Folic Acid  (FOLATE PO), Take 0.5 tablets by mouth daily. Folate as calcium foliate 680 mcg DFE, Disp: , Rfl:    guaifenesin (HUMIBID E) 400 MG TABS tablet, Take 400 mg by mouth as needed., Disp: , Rfl:    IODINE EX, Take 225 mcg by mouth. Every 3 days, Disp: , Rfl:    KETAMINE HCL SL, Place into right nostril as needed., Disp: , Rfl:    Ketotifen Fumarate POWD, 0.5 mg by Does not apply route. 6-12 times a day, Disp: , Rfl:    L-Lysine 500 MG CAPS, Take by mouth 3 (three) times daily., Disp: , Rfl:    levocetirizine (XYZAL ) 2.5 MG/5ML solution, as needed., Disp: , Rfl:    Light Mineral Oil-Mineral Oil 0.5-0.5 % EMUL, Apply 1 application to eye daily., Disp: , Rfl:    lipase/protease/amylase (CREON ) 12000-38000 units CPEP capsule, TAKE 1-3 CAPSULES (12,000-36,000 UNITS TOTAL) BY MOUTH 3 (THREE) TIMES DAILY BEFORE MEALS. PATIENT MAY TAKE AN ADDITIONAL 3 CAPSULES PER DAY FOR  SNACK., Disp: 1100 capsule, Rfl: 11   loperamide (IMODIUM) 2 MG capsule, Take by mouth as needed for diarrhea or loose stools., Disp: , Rfl:    Lutein 20 MG CAPS, Take 2 mg by mouth daily., Disp: , Rfl:    Magnesium Citrate POWD, Take 100 mg by mouth daily., Disp: , Rfl:    Melatonin 1  MG/ML LIQD, Take 1 mg by mouth daily as needed., Disp: , Rfl:    Menatetrenone (VITAMIN K2) 100 MCG TABS, 100 mcg daily., Disp: , Rfl:    montelukast  (SINGULAIR ) 5 MG chewable tablet, Chew 10 mg by mouth daily at 2 PM., Disp: , Rfl:    Non Gelatin Capsules, Empty, (CAPSULE #3 CLEAR/CLEAR VEG) CAPS, Take 1 capsule by mouth daily., Disp: , Rfl:    NONFORMULARY OR COMPOUNDED ITEM, Bupivacaine  nasal 0.5% drops w/o epi, Disp: , Rfl:    omega-3 acid ethyl esters (LOVAZA ) 1 g capsule, Take 1 capsule (1 g total) by mouth daily., Disp: 90 capsule, Rfl: 3   PHOSPHATIDYL CHOLINE PO, Take 1 tablet by mouth daily., Disp: , Rfl:    Phytonadione  (VITAMIN K1 ) POWD, 100 mcg daily., Disp: , Rfl:    pyridostigmine  (MESTINON ) 60 MG/5ML solution, Take 60 mg by mouth 5 (five) times daily., Disp: , Rfl:    Riboflavin (VITAMIN B-2 PO), Take 100 mg by mouth 3 (three) times daily., Disp: , Rfl:    Selenium 200 MCG CAPS, Take 1 capsule by mouth. Twice a month, Disp: , Rfl:    simethicone (MYLICON) 80 MG chewable tablet, Chew 80 mg by mouth every 6 (six) hours as needed for flatulence., Disp: , Rfl:    Sodium Fluoride  1.1 % PSTE, Place onto teeth., Disp: , Rfl:    Suvorexant  5 MG TABS, Take 5 mg by mouth., Disp: , Rfl:    trolamine salicylate (ASPERCREME/ALOE) 10 % cream, Apply topically., Disp: , Rfl:    Ubrogepant  (UBRELVY ) 50 MG TABS, Take 1 tablet (50 mg total) by mouth as needed (for migriane). May repeat a dose in 2 hours if headache persists, Disp: 16 tablet, Rfl: 6   vitamin C (ASCORBIC ACID) 500 MG tablet, Take 500 mg by mouth daily. With calcium, Disp: , Rfl:    Vitamin D , Cholecalciferol, 10 MCG (400 UNIT) TABS, Take 1,000 Int'l Units/day by mouth daily., Disp: , Rfl:    ZORYVE  0.3 % CREA, APPLY TO ELBOWS ONCE DAILY., Disp: 60 g, Rfl: 2   Family History  Problem Relation Age of Onset   Colon cancer Father 87   Cancer Father    Early death Father    Breast cancer Maternal Aunt 50       BRCA neg    Endometrial cancer Maternal Aunt 50   Cancer Maternal Aunt    Obesity Maternal Aunt    Colon cancer Maternal Grandmother 33   Migraines Maternal Grandmother    Tremor Maternal Grandmother    Cancer Maternal Grandmother    Varicose Veins Maternal Grandmother    Leukemia Maternal Grandfather 2   Food Allergy Maternal Grandfather    Cancer Maternal Grandfather    Heart disease Maternal Grandfather    Migraines Mother    Glaucoma Mother    Cataracts Mother    Tremor Mother    Fibromyalgia Mother    Heart disease Mother    ADD / ADHD Mother    Anxiety disorder Mother    Arthritis Mother    Hearing loss Mother    Hyperlipidemia Mother  Hypertension Mother    Varicose Veins Mother    Colon polyps Brother        non-cancerous   ADD / ADHD Brother      Social History   Tobacco Use   Smoking status: Former    Current packs/day: 0.00    Average packs/day: 0.1 packs/day for 5.0 years (0.5 ttl pk-yrs)    Types: Cigarettes    Quit date: 09/15/1994    Years since quitting: 29.3   Smokeless tobacco: Never   Tobacco comments:    Smoked a few cigarettes in high school and college, but never a regular smoker  Vaping Use   Vaping status: Never Used  Substance Use Topics   Alcohol use: Not Currently   Drug use: Not Currently    Types: Marijuana    Comment: Marijuana occasionally in my 20's    Allergies as of 01/20/2024 - Review Complete 01/20/2024  Allergen Reaction Noted   Iodinated contrast media Hives, Itching, Palpitations, Photosensitivity, and Shortness Of Breath 12/13/2020   Ioversol Anxiety, Hives, Itching, and Palpitations 12/05/2016   Ciprofloxacin Other (See Comments) 01/12/2018   Levofloxacin Other (See Comments) 01/12/2018   Other Other (See Comments) 12/17/2017   Soy allergy (obsolete)  12/17/2017   Soybean-containing drug products Other (See Comments) 12/17/2017   Bee pollen  08/11/2018   Bupropion  08/11/2018   Celebrex  [celecoxib ]  08/11/2018   Codeine   08/11/2018   Dexilant [dexlansoprazole]  08/11/2018   Doxepin  09/05/2022   Dulera [mometasone furo-formoterol fum]  08/11/2018   Fentanyl  08/11/2018   Fluticasone  08/11/2018   Gabapentin  08/11/2018   Gluten meal  08/11/2018   Histidine  09/26/2021   Lidocaine   08/11/2018   Lyrica [pregabalin]  08/11/2018   Mold extract [trichophyton]  08/11/2018   Morphine and codeine  08/11/2018   Penicillins  08/11/2018   Pollen extract  08/11/2018   Polysorbate [sorbitan]  09/26/2021   Prilosec [omeprazole ]  08/11/2018   Rizatriptan  Other (See Comments) 04/14/2019   Salmeterol xinafoate  02/27/2012   Silenor [doxepin hcl]  08/11/2018   Stevioside Other (See Comments) 03/15/2020   Tomato  08/11/2018   Xylitol Other (See Comments) 03/15/2020   Brewer's dried yeast [yeast] Other (See Comments) 06/11/2021   Ethanol Dermatitis, Other (See Comments), Itching, and Palpitations 05/31/2019   Hydrocodone Nausea Only 09/03/2018   Ibuprofen Nausea And Vomiting 02/27/2012   Lanolin Other (See Comments) 12/09/2017   Morphine Nausea Only 08/11/2018   Wheat Diarrhea 11/13/2014   Yeast-derived drug products Other (See Comments) 06/11/2021    Review of Systems:    All systems reviewed and negative except where noted in HPI.   Physical Exam:  BP 115/78 (BP Location: Left Arm, Patient Position: Sitting, Cuff Size: Normal)   Pulse 68   Temp 98.1 F (36.7 C) (Oral)   Ht 5\' 2"  (1.575 m)   Wt 141 lb 8 Zhang (64.2 kg)   BMI 25.88 kg/m  No LMP recorded. Patient is postmenopausal.  General:   Alert,  Well-developed, well-nourished, pleasant and cooperative in NAD Head:  Normocephalic and atraumatic. Eyes:  Sclera clear, no icterus.   Conjunctiva pink. Ears:  Normal auditory acuity. Nose:  No deformity, discharge, or lesions. Mouth:  No deformity or lesions,oropharynx pink & moist. Neck:  Supple; no masses or thyromegaly. Lungs:  Respirations even and unlabored.  Clear throughout to auscultation.   No  wheezes, crackles, or rhonchi. No acute distress. Heart:  Regular rate and rhythm; no murmurs, clicks,  rubs, or gallops. Abdomen:  Normal bowel sounds. Soft, non-tender and nondistended, faint midline vertical scar without masses, hepatosplenomegaly or hernias noted.  No guarding or rebound tenderness.   Rectal: Not performed Msk:  Symmetrical without gross deformities. Good, equal movement & strength bilaterally. Pulses:  Normal pulses noted. Extremities:  No clubbing or edema.  No cyanosis. Neurologic:  Alert and oriented x3;  grossly normal neurologically. Skin:  Intact without significant lesions or rashes. No jaundice. Psych:  Alert and cooperative. Normal mood and affect.  Imaging Studies: Reviewed  Assessment and Plan:   Autumn Zhang is a 54 y.o. Caucasian female with history of colon cancer in 2009 status post right hemicolectomy with primary anastomosis, neoadjuvant chemo, in remission, mast cell activation syndrome, mild pelvic floor dysfunction with mild rectocele, pelvic floor dysfunction, exocrine pancreatic insufficiency.  Patient is seen for follow-up of flareup of GERD symptoms in setting of NSAID use for right arm fracture and to discuss about management of constipation  Flareup of GERD Currently on Pepcid  10 mg daily, advised her that she could take Pepcid  up to 40 mg 1-2 times daily Discussed about alternative options such as sucralfate or vonoprazan She cannot take PPI due to mast cell activation syndrome  Chronic constipation In addition to Colace or magnesium citrate as needed, discussed about lactulose She will let me know if she needs a prescription  History of colon cancer, history of tubular adenomas of colon: Last colonoscopy was normal in 07/2021 Recommend surveillance colonoscopy every 5 years   Follow up as needed   Autumn Oz, MD

## 2024-01-21 ENCOUNTER — Other Ambulatory Visit: Payer: Self-pay | Admitting: Pharmacist

## 2024-01-22 NOTE — Progress Notes (Signed)
   01/22/2024  Patient ID: Autumn Zhang, female   DOB: [birth year], 53 y.o.   MRN: [id number]  Received referral from Dr. Bacigalupo for review of medication list and discussion of medications that have potential anticholinergic properties.   Reviewed medication and supplements with [facility] list- none of concern besides loperamide. Patient uses simethicone instead for diarrhea concerns. Will discontinue loperamide from list.  Patient has a history of mast cell activation syndrome. Has several medications to assist. Was told she can use a combination of 4 non-drowsy antihistamines consistently.  Newly started Hydroxyzine  10mg  up to five times a day as needed.  Extensive conversation about Hydroxyzine  sedation in comparison to Benadryl .   Sent 4 articles to patient's email surrounding [facility] treatment, antihistamine comparisons, and anticholinergic effects.   No follow-up needed.    Delvin File, PharmD [facility]  Phone Number: [phone]

## 2024-01-26 ENCOUNTER — Encounter: Payer: Self-pay | Admitting: Family Medicine

## 2024-01-26 ENCOUNTER — Ambulatory Visit: Admitting: Family Medicine

## 2024-01-26 ENCOUNTER — Other Ambulatory Visit (HOSPITAL_COMMUNITY): Payer: Self-pay

## 2024-01-26 ENCOUNTER — Ambulatory Visit: Payer: Self-pay

## 2024-01-26 ENCOUNTER — Encounter: Payer: Self-pay | Admitting: Oncology

## 2024-01-26 VITALS — BP 124/70 | HR 77 | Temp 98.6°F | Ht 62.0 in | Wt 141.8 lb

## 2024-01-26 DIAGNOSIS — S52501S Unspecified fracture of the lower end of right radius, sequela: Secondary | ICD-10-CM | POA: Diagnosis not present

## 2024-01-26 DIAGNOSIS — D8940 Mast cell activation, unspecified: Secondary | ICD-10-CM

## 2024-01-26 DIAGNOSIS — G5601 Carpal tunnel syndrome, right upper limb: Secondary | ICD-10-CM | POA: Diagnosis not present

## 2024-01-26 DIAGNOSIS — Q279 Congenital malformation of peripheral vascular system, unspecified: Secondary | ICD-10-CM

## 2024-01-26 DIAGNOSIS — M25531 Pain in right wrist: Secondary | ICD-10-CM

## 2024-01-26 DIAGNOSIS — Q7962 Hypermobile Ehlers-Danlos syndrome: Secondary | ICD-10-CM

## 2024-01-26 NOTE — Telephone Encounter (Signed)
  Chief Complaint: Wrist pain, vascular concerns, bruising Symptoms: above Frequency: Since February - however recent changes to the are Pertinent Negatives: Patient denies  Disposition: [] ED /[] Urgent Care (no appt availability in office) / [x] Appointment(In office/virtual)/ []  [facility]/ [] Home Care/ [] Refused Recommended Disposition /[] [facility]/ []  Follow-up with PCP Additional Notes: Pt broke her wrist in February. Pt is now noticing vascular changes in area, stabbing intermittent pain. Appt for today scheduled.  Copied from CRM [id number]. Topic: Clinical - Red Word Triage >> Jan 26, 2024  8:06 AM Oddis Bench wrote: Red Word that prompted transfer to Nurse Triage: Patient is calling stating she broke her risk in feb and she has bulging vein and since she had been seen she has more and she has stabbing pain in the vein it last just a couple of seconds but it wakes her up at night and where the pain is located she has had a new vein that came up last night looks like a bruise under her skin. Reason for Disposition  Numbness (i.e., loss of sensation) in hand or fingers  (Exception: Just tingling; numbness present > 2 weeks.)  Answer Assessment - Initial Assessment Questions 1. ONSET: "When did the pain start?"     February 2. LOCATION: "Where is the pain located?"     Wrist. 3. PAIN: "How bad is the pain?" (Scale 1-10; or mild, moderate, severe)   - MILD (1-3): doesn't interfere with normal activities   - MODERATE (4-7): interferes with normal activities (e.g., work or school) or awakens from sleep   - SEVERE (8-10): excruciating pain, unable to use hand at all     Sever - for very short periods. 4. WORK OR EXERCISE: "Has there been any recent work or exercise that involved this part (i.e., hand or wrist) of the body?"     Broke wrist in February 5. CAUSE: "What do you think is causing the pain?"     Broken wrist.  6. AGGRAVATING FACTORS: "What makes the pain  worse?" (e.g., using computer)     unsure 7. OTHER SYMPTOMS: "Do you have any other symptoms?" (e.g., neck pain, swelling, rash, numbness, fever)     Veins popping up - stabbing pain  Protocols used: Hand and Wrist Pain-A-AH

## 2024-01-26 NOTE — Telephone Encounter (Signed)
 PT has her PCP prescribing her Qulipta  so I closed out the PA I started to allow them yo handle the PA-they had already started working on it.

## 2024-01-26 NOTE — Progress Notes (Signed)
 Established patient visit   Patient: Autumn Zhang   DOB: Nov 12, 1970   53 y.o. Female  MRN: [id number] Visit Date: 01/26/2024  Today's healthcare provider: Carlean Charter, DO   Chief Complaint  Patient presents with   Wrist Pain    Pt broke her wrist in February. Pt is now noticing vascular changes in area associated with stabbing intermittent pain. She reports it wakes her up at night and where the pain is located she has had a new vein that came up last night looks like a bruise under her skin. Reports pain can be sever for sveral short seconds. Pt reports taking tylenol  and different topicals but nothing is helping with this pain.    Subjective    HPI Analyse Autumn Zhang is a 53 year old female with Ehlers-Danlos syndrome who presents with persistent wrist pain and vascular changes.  In February, she sustained a fracture of the distal radius after a fall. She was treated with a splint, cast, and brace, but did not undergo surgery. Since the injury, she has experienced persistent pain and swelling in the wrist. The pain is described as stabbing and radiates from the wrist down the arm, looping around and returning to the wrist. She also experiences numbness in the first four fingers when attempting to extend the wrist. A nerve test indicated mild carpal tunnel syndrome.  Over the past few weeks, she has noticed vascular changes in the wrist, with veins becoming more prominent and painful. She reports there is a dime-sized bruise under the skin that is visible under a red light. A compression stocking has been used, which helps with the pain. Supportive care for her wrist has been ongoing.  She has a history of Ehlers-Danlos syndrome, which complicates her rehabilitation due to joint instability. She has been using mast cell medications, topical cortisone, and Benadryl  for pain management, which she finds helpful. She tried naproxen but experienced mast cell flaring, so she discontinued it.  She takes 325 mg aspirin every three days.  She also has fiberglass splinters in her hand from the split used initially, which have been difficult to remove. Some have been extracted by her and her PCP, but others remain embedded.  She reports history of osteopenia, which may impact the healing process of her fracture. No recent fever or other systemic symptoms.     Medications: Outpatient Medications Prior to Visit  Medication Sig   acetaminophen  (TYLENOL ) 500 MG tablet Take 500 mg by mouth every 6 (six) hours as needed.   albuterol  (PROVENTIL  HFA;VENTOLIN  HFA) 108 (90 Base) MCG/ACT inhaler Inhale 2 puffs into the lungs every 4 (four) hours as needed.    aspirin 325 MG tablet Take 325 mg by mouth. Every 3 days   Atogepant  (QULIPTA ) 10 MG TABS Take 1 tablet by mouth daily.   azelastine (ASTELIN) 0.1 % nasal spray Place into both nostrils as needed for rhinitis. Use in each nostril as directed   Blood Pressure Monitoring (BLOOD PRESSURE CUFF) MISC Take blood pressure as needed for symptoms.   Boswellia Serrata (BOSWELLIA PO) Take 150 mg by mouth every morning.    Calcium Citrate 200 MG TABS Take 600 mg by mouth. powder   cromolyn  (GASTROCROM ) 100 MG/5ML solution Take by mouth 4 (four) times daily -  before meals and at bedtime.   Cyanocobalamin (VITAMIN B-12) 1000 MCG SUBL Place 500 mcg under the tongue daily.   Diclofenac Sodium 1.5 % SOLN 40 drops Externally Four  times a day   diphenhydrAMINE -Allantoin 2-0.5 % CREA Apply 1 application topically as needed.    docusate sodium (COLACE) 100 MG capsule Take 100 mg by mouth every other day.    Epinastine HCl 0.05 % ophthalmic solution Apply 1 drop to eye 2 (two) times daily.   EPINEPHrine  0.3 mg/0.3 mL IJ SOAJ injection Inject 0.3 mg into the muscle as needed for anaphylaxis. INJECT ONE AUTO-INJECTOR INTRAMUSCULARLY AS NEEDED   estradiol  (ESTRACE ) 0.1 MG/GM vaginal cream INSERT 1 GRAM VAGINALLY 2  TIMES A WEEK   famotidine  (PEPCID ) 10 MG  tablet Take 10 mg by mouth daily.    fexofenadine (ALLEGRA) 180 MG tablet Take 180 mg by mouth 2 (two) times daily.   Folic Acid  (FOLATE PO) Take 0.5 tablets by mouth daily. Folate as calcium foliate 680 mcg DFE   guaifenesin (HUMIBID E) 400 MG TABS tablet Take 400 mg by mouth as needed.   hydrOXYzine  (ATARAX ) 10 MG tablet Take 10 mg by mouth 5 (five) times daily as needed for itching (Hives).   IODINE EX Take 225 mcg by mouth. Every 3 days   KETAMINE HCL SL Place into right nostril as needed.   Ketotifen Fumarate POWD 0.5 mg by Does not apply route. 6-12 times a day   L-Lysine 500 MG CAPS Take by mouth 3 (three) times daily.   levocetirizine (XYZAL ) 2.5 MG/5ML solution as needed.   Light Mineral Oil-Mineral Oil 0.5-0.5 % EMUL Apply 1 application to eye daily.   lipase/protease/amylase (CREON ) 12000-38000 units CPEP capsule TAKE 1-3 CAPSULES (12,000-36,000 UNITS TOTAL) BY MOUTH 3 (THREE) TIMES DAILY BEFORE MEALS. PATIENT MAY TAKE AN ADDITIONAL 3 CAPSULES PER DAY FOR SNACK.   Magnesium Citrate POWD Take 100 mg by mouth daily.   Melatonin 1 MG/ML LIQD Take 1 mg by mouth daily as needed.   Menatetrenone (VITAMIN K2) 100 MCG TABS 100 mcg daily.   montelukast  (SINGULAIR ) 5 MG chewable tablet Chew 10 mg by mouth daily at 2 PM.   Non Gelatin Capsules, Empty, (CAPSULE #3 CLEAR/CLEAR VEG) CAPS Take 1 capsule by mouth daily.   NONFORMULARY OR COMPOUNDED ITEM Bupivacaine  nasal 0.5% drops w/o epi   omega-3 acid ethyl esters (LOVAZA ) 1 g capsule Take 1 capsule (1 g total) by mouth daily.   PHOSPHATIDYL CHOLINE PO Take 1 tablet by mouth daily.   Phytonadione  (VITAMIN K1 ) POWD 100 mcg daily.   pyridostigmine  (MESTINON ) 60 MG/5ML solution Take 60 mg by mouth 5 (five) times daily. Patient is taking 7.5ml   Riboflavin (VITAMIN B-2 PO) Take 100 mg by mouth 3 (three) times daily.   Selenium 200 MCG CAPS Take 1 capsule by mouth. Twice a month   simethicone (MYLICON) 80 MG chewable tablet Chew 80 mg by mouth every  6 (six) hours as needed for flatulence.   Sodium Fluoride  1.1 % PSTE Place onto teeth.   Suvorexant  5 MG TABS Take 5 mg by mouth.   trolamine salicylate (ASPERCREME/ALOE) 10 % cream Apply topically.   Ubrogepant  (UBRELVY ) 50 MG TABS Take 1 tablet (50 mg total) by mouth as needed (for migriane). May repeat a dose in 2 hours if headache persists   vitamin C (ASCORBIC ACID) 500 MG tablet Take 500 mg by mouth daily. With calcium   Vitamin D , Cholecalciferol, 10 MCG (400 UNIT) TABS Take 1,000 Int'l Units/day by mouth daily.   ZORYVE  0.3 % CREA APPLY TO ELBOWS ONCE DAILY.   loperamide (IMODIUM) 2 MG capsule Take by mouth as needed for diarrhea or  loose stools. (Patient not taking: Reported on 01/22/2024)   Lutein 20 MG CAPS Take 2 mg by mouth daily.   No facility-administered medications prior to visit.        Objective    BP 124/70 (BP Location: Left Arm, Patient Position: Sitting, Cuff Size: Normal)   Pulse 77   Temp 98.6 F (37 C) (Oral)   Ht 5\' 2"  (1.575 m)   Wt 141 lb 12.8 oz (64.3 kg)   SpO2 98%   BMI 25.94 kg/m     Physical Exam Vitals and nursing note reviewed.  Constitutional:      General: She is not in acute distress.    Appearance: Normal appearance.  HENT:     Head: Normocephalic and atraumatic.  Eyes:     General: No scleral icterus.    Conjunctiva/sclera: Conjunctivae normal.  Cardiovascular:     Rate and Rhythm: Normal rate.  Pulmonary:     Effort: Pulmonary effort is normal.  Skin:    Comments: Mild swelling noted to right wrist, with vasculature on ventral aspect more prominent, when compared to the left, and tender on palpation.  Neurological:     Mental Status: She is alert and oriented to person, place, and time. Mental status is at baseline.  Psychiatric:        Mood and Affect: Mood normal.        Behavior: Behavior normal.      Results for orders placed or performed in visit on 01/26/24  D-Dimer, Quantitative  Result Value Ref Range   D-DIMER  <0.20 0.00 - 0.49 mg/L FEU    Assessment & Plan    Right wrist pain  Venous anomaly -     D-dimer, quantitative  Closed fracture of distal end of right radius, unspecified fracture morphology, sequela  Carpal tunnel syndrome of right wrist  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HCC)  Ehlers-Danlos syndrome, type 3     Right wrist pain; fracture of distal radius; venous anomaly Persistent swelling, pain, and vascular changes post-fracture. Good new bone formation despite osteopenia noted during recent orthopedist visit. Ehlers-Danlos syndrome may complicate vascular changes, though it is not the predominantly-vascular type. - Order D-dimer test to assess clotting risk. - Recommend low-dose aspirin 81 mg daily. - Advise heat, elevation, and compression for swelling. - Consider ultrasound if D-dimer is elevated. - Potential referral to vascular specialist if indicated.  Carpal tunnel syndrome, right-sided Mild carpal tunnel syndrome with numbness in the first four fingers. Swelling likely contributing to symptoms.  Ehlers-Danlos syndrome Contributing to joint instability and complicating rehabilitation. Potential impact on vascular changes.  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  She finds her medications for MCAS seem to reduce pain. Aspirin provides better relief than naproxen. - Continue mast cell medications. - Use topical cortisone and Benadryl  for pain relief. - Avoid naproxen. - Consider increasing aspirin frequency as noted above if tolerated.    Return if symptoms worsen or fail to improve.      I discussed the assessment and treatment plan with the patient  The patient was provided an opportunity to ask questions and all were answered. The patient agreed with the plan and demonstrated an understanding of the instructions.   The patient was advised to call back or seek an in-person evaluation if  the symptoms worsen or if the condition fails to improve as anticipated.    Carlean Charter, DO  [facility] [phone] (phone) [phone] (fax)  [facility]

## 2024-01-27 ENCOUNTER — Ambulatory Visit: Payer: Self-pay | Admitting: Family Medicine

## 2024-01-27 LAB — D-DIMER, QUANTITATIVE: D-DIMER: <0.2 mg{FEU}/L (ref 0.00–0.49)

## 2024-01-28 ENCOUNTER — Encounter: Payer: Self-pay | Admitting: Family Medicine

## 2024-01-28 LAB — HM DEXA SCAN

## 2024-02-01 NOTE — Therapy (Signed)
 OUTPATIENT OCCUPATIONAL THERAPY ORTHO EVALUATION  Patient Name: Autumn Zhang MRN: [id number] DOB:Mar 22, 1971, 53 y.o., female Today's Date: 02/02/2024  PCP: Dr David Escort REFERRING PROVIDER: Dr Windle Hatch  END OF SESSION:  OT End of Session - 02/02/24 1901     Visit Number 1    Number of Visits 14    Date for OT Re-Evaluation 03/29/24    OT Start Time 0905    OT Stop Time 1007    OT Time Calculation (min) 62 min    Activity Tolerance Patient tolerated treatment well;Patient limited by pain    Behavior During Therapy [facility] for tasks assessed/performed             Past Medical History:  Diagnosis Date   Allergy    Anemia    Anxiety    Arthritis    Asthma    Attention deficit hyperactivity disorder (ADHD), combined type 10/18/2015   Diagnosed many years ago.  Has been doing well on Ritalin  5mg  QID to six times a day. Long acting hasn't worked well in the past and she has not wanted to try Adderall. She is taking wellbutrin as well, but she has been weaning medication due to SE migraine.  She is now down to Wellbutrin one quarter tablet daily.  Last Assessment & Plan:  A/P: Stable.  Continue current dosing.  Refilled 3 months   Basal cell carcinoma 05/16/2021   Superficial, R post lower leg. [facility] [place], [place]   Basal cell carcinoma 09/03/2006   L chest, R forearm. 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   Dysautonomia (HCC)    Dysplastic nevus 05/28/2009   right lateral back, [facility] [place], [place]   Dysplastic nevus 01/13/2023   left lower calf, mod to severe, shave removal 03/16/23, margins free 03/16/23   Ehlers-Danlos syndrome type III    Family history of colon cancer    GERD (gastroesophageal reflux disease)    Hyperlipidemia    Idiopathic small fiber peripheral  neuropathy    Leiomyoma    Mast cell activation (HCC)    Migraines    Neck pain    Occipital neuralgia    Sleep apnea    Past Surgical History:  Procedure Laterality Date   APPENDECTOMY     removed with colectomy   BREAST SURGERY     reduction   COLON SURGERY  2008   for colon cancer, ~1/3 of colon removed, primary reanastimosis   COLONOSCOPY WITH PROPOFOL  N/A 10/07/2018   Procedure: COLONOSCOPY WITH PROPOFOL ;  Surgeon: Selena Daily, MD;  Location: [facility] ENDOSCOPY;  Service: Gastroenterology;  Laterality: N/A;   COLONOSCOPY WITH PROPOFOL  N/A 08/14/2021   Procedure: COLONOSCOPY WITH PROPOFOL ;  Surgeon: Selena Daily, MD;  Location: [facility] ENDOSCOPY;  Service: Gastroenterology;  Laterality: N/A;   ENDOMETRIAL ABLATION     trigeminal stimulator     Patient Active Problem List   Diagnosis Date Noted   Psoriatic arthritis (HCC) 12/21/2023   MTHFR mutation 12/21/2023   Lymphedema 04/28/2023   Facial swelling 04/13/2023   Liver cyst 06/24/2022   Decreased appetite 06/24/2022   Generalized abdominal pain 06/24/2022   Abnormal weight gain 02/03/2022   Central obesity 02/03/2022   Impaired fasting glucose 02/03/2022   Cervical spondylosis with radiculopathy 10/09/2021   Psoriasis 10/08/2021  Exocrine pancreatic insufficiency 09/26/2021   Urinary frequency 09/26/2021   Atypical facial pain (1ry area of Pain) (Left) 08/29/2021   Chronic ear pain (Left) 08/29/2021   Temporal headache (Left) 08/29/2021   Inner ear pain (Left) 08/29/2021   Trigeminal nerve disorder (Left) 08/29/2021   History of photophobia 08/29/2021   TMJ (temporomandibular joint syndrome) 08/29/2021   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   Polypharmacy 08/29/2021   Severe frontal headaches 08/29/2021   Chronic occipital headache (Midline) (2ry area of Pain) 08/29/2021   Chronic neck pain (posterior) (4th area of Pain) (Bilateral) (L>R) 08/29/2021   Musculoskeletal disorder involving upper  trapezius muscle 08/29/2021   Musculoskeletal disorder involving sternocleidomastoid (Left) 08/29/2021   Abnormal MRI, cervical spine (02/11/2019) 08/29/2021   Shoulder stiffness (Right) 08/29/2021   Pharmacologic therapy 08/27/2021   Disorder of skeletal system 08/27/2021   Problems influencing health status 08/27/2021   GAD (generalized anxiety disorder) 07/17/2021   Difficulty walking 07/17/2021   Headache disorder (3ry area of Pain) 07/17/2021   Photophobia 07/17/2021   Difficulty with speech 07/17/2021   Tear film insufficiency 07/15/2021   Left lower quadrant abdominal pain 07/05/2021   Palpitations 06/11/2021   Axillary lymphadenopathy 06/11/2021   Immunosuppressed status (HCC) 04/19/2021   History of seronegative inflammatory arthritis 06/28/2020   Osteopenia of multiple sites 06/14/2020   Polyarthralgia 06/12/2020   Iodine deficiency 06/12/2020   OSA on CPAP 06/12/2020   Chronic pain syndrome 06/12/2020   Psychophysiological insomnia 11/26/2019   Calcific tendinitis of shoulder (Right) 04/10/2019   History of iron  deficiency anemia 11/04/2018   History of basal cell carcinoma 10/07/2018   Vaginal dryness 09/07/2018   Chronic jaw pain (Left) 07/15/2018   Chronic migraine with aura 07/15/2018   Ehlers-Danlos syndrome, type 3 07/15/2018   Rectocele 05/21/2018   Ehlers-Danlos syndrome 03/15/2018   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   Cardiac arrhythmia 01/21/2018   Polyp of colon 01/21/2018   Chronic constipation 01/21/2018   Instability of shoulder joint (Right) 01/12/2018   Trigeminal neuralgia (V1-3) (Left) 12/25/2017   Instability of shoulder joint (Left) 12/25/2017   Nausea 12/25/2017   Occipital neuralgia 12/25/2017   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   Hiatal hernia 10/14/2017   TMJ (dislocation of temporomandibular joint) 09/29/2017   Irritable bowel syndrome 07/02/2017   Anal fissure 07/02/2017   Anemia  07/02/2017   Gastroesophageal reflux disease 07/02/2017   Hemorrhoids 07/02/2017   Malignant neoplasm of skin 07/02/2017   Malignant tumor of colon (HCC) 07/02/2017   Arthritis 07/02/2017   Fibromyalgia 07/02/2017   Pulsatile tinnitus of ear (Left) 12/12/2016   Restless leg syndrome 08/01/2016   Cervical dystonia 03/21/2016   Cervical spine disease 03/21/2016   Cervicalgia 02/06/2016   Paresthesia 02/06/2016   Hypercholesterolemia 12/24/2015   Iron  deficiency anemia 10/18/2015   Vitamin D  deficiency disease 10/18/2015   Midline low back pain without sciatica 02/06/2015   Hyperglycemia 10/30/2014   History of colon cancer 09/27/2014   Postural orthostatic tachycardia syndrome 09/16/2003   Chronic fatigue 09/15/2000   Asthma 09/15/1988   Dyspnea 09/15/1988    ONSET DATE: 11/03/23  REFERRING DIAG: R wrist fx with numbness and pain   THERAPY DIAG:  Pain in right hand  Pain in right wrist  Stiffness of right hand, not elsewhere classified  Stiffness of right wrist, not elsewhere classified  Muscle weakness (generalized)  Rationale for Evaluation and Treatment: Rehabilitation  SUBJECTIVE:   SUBJECTIVE STATEMENT: I fractured my wrist in February.  I was in a cast for 3 weeks and then a brace for another 3 weeks that really hurts my thumb.  It was too big.  They tried to work with me therapy wise but the stretching and therapy just made everything worse my wrist my thumb.  My pain can increase to a 9/10 at the base of the thumb in my thumb and over the back of my hand.  I need stabilization at my carpal bones and my wrist Pt accompanied by: self  PERTINENT HISTORY: 01/13/24 ortho note Dr Windle Hatch  I personally reviewed and visualized the imaging studies if available.  - EMG/NCS RUE = evaluate for any acute nerve injury from her distal radius fracture causing her reported symptoms  Assessment:   ICD-10-CM  1. Closed Colles' fracture of right radius, initial encounter  S52.531A  2. Stiffness of right wrist joint M25.631  3. Numbness and tingling in right hand R20.0  R20.2   Plan:   I have discussed the nature of her current subjective complaints, clinical examination, test results and have reviewed treatment options. The plan is to do the following;  - The patient has ongoing right wrist pain, swelling, decreased motion, hand numbness and tingling, difficulty with hand function after a distal radius fracture on 11/03/2023. She seems to have been treated appropriately with splint followed by cast followed by brace then referred to occupational therapy. Some of her symptoms could simply be due to ordinary sequela I of injury and recovery has joint stiffness could be prolonged for up to 6-12 months. She could be developing carpal tunnel syndrome which can occur with a distal radius fracture as well. I am unsure about her ulnar sided fingers issue? This could be some nerve injury versus biomechanical abnormality because of significant loss to wrist motion versus developing complex regional pain syndrome. - She has had recent x-rays that show healing to her nondisplaced, extra-articular distal radius fracture. There is some osteopenia surrounding the joint which could be from disuse/immobilization. She has significant decreased her wrist motion. Pain limits a lot of examination. I discussed the normal recovery after distal radius fracture and how some of the things she is feeling can occur. Because of the complexity of her past medical history and her reported issues that seem neurologic, I recommended EMG ASAP to evaluate for any nerve damage. I also referred her to a different occupational therapist to see if they can provide any different treatments. Depending on the EMG results, she may need referral to neurosurgery versus hand surgery specialist versus general orthopedic surgeon. - Daily activity as tolerated. Modify activity as needed according to symptoms. No  limitations to pain-free weight bearing. No need for immobilization or bracing. - Refer to OT for pain relieving modalities, manual techniques as indicated, home exercise planning. Continue home exercise program to maintain strength, flexibility, and endurance. - Use chronic medications, over-the-counter medicines, topical diclofenac/pain cream, relative rest, compression, massage, and ice/heat as needed for pain.  - Follow up depending on EMG results. I did discuss that we could consider carpal tunnel steroid injection as a possible treatment.   PRECAUTIONS: none     WEIGHT BEARING RESTRICTIONS: No  PAIN:  Are you having pain? 2-9/10 radial wrist and thumb as well as dorsal hand and wrist  FALLS: Has patient fallen in last 6 months? ?  LIVING ENVIRONMENT: Lives with: lives with their spouse    PLOF: on disability -patient occupation was International aid/development worker.  Because of her medical history she does not  participate in cooking and laundry and cleaning.  But is active around the house.  Likes to walk.  On the computer and phone. Patient did prior to injury had pain at the thumb.  And at the carpal bones at times.  Appear active range of motion was within functional limits   PATIENT GOALS: I want the pain better my right hand and wrist and get my motion and strength back so I can use my hand  NEXT MD VISIT: ?  OBJECTIVE:  Note: Objective measures were completed at Evaluation unless otherwise noted.  HAND DOMINANCE: Right  ADLs: Severe difficulty using right hand. Unable to grip, pull or push or hold or carry.  Limited by severe wrist stiffness.   FUNCTIONAL OUTCOME MEASURES: Next session  UPPER EXTREMITY ROM:     Active ROM Right eval Left eval  Shoulder flexion    Shoulder abduction    Shoulder adduction    Shoulder extension    Shoulder internal rotation    Shoulder external rotation    Elbow flexion    Elbow extension    Wrist flexion 25   Wrist extension 5   Wrist  ulnar deviation 10   Wrist radial deviation 5   Wrist pronation 90   Wrist supination 63   (Blank rows = not tested)  Active ROM Right eval Left eval  Thumb MCP (0-60) 50   Thumb IP (0-80) 50   Thumb Radial abd/add (0-55) 45    Thumb Palmar abd/add (0-45) 50    Thumb Opposition to Small Finger Opposition to all digits but increased pain 3rd through 5th    Index MCP (0-90) 65    Index PIP (0-100) 80    Index DIP (0-70)      Long MCP (0-90)  70    Long PIP (0-100)  90    Long DIP (0-70)      Ring MCP (0-90)  70    Ring PIP (0-100)  90    Ring DIP (0-70)      Little MCP (0-90)  75    Little PIP (0-100)  90    Little DIP (0-70)      (Blank rows = not tested)  HAND FUNCTION: Grip strength: Right:   lbs; Left:   lbs, Lateral pinch: Right:   lbs, Left:   lbs, and 3 point pinch: Right:   lbs, Left:   lbs  COORDINATION: Impaired because of severe stiffness in right hand digits and wrist   SENSATION: Patient report nerve conduction showed minimal carpal tunnel. Will further assess   EDEMA: Appeared to have some edema in the digits as well as the hand.  COGNITION: Overall cognitive status: Within functional limits for tasks assessed     TREATMENT DATE: 02/02/24                                                                                                                            Modalities: Patient  to do contrast to 3 times a day. Prior to gentle pain-free active assisted range of motion for supination pronation, radial ulnar deviation wrist flexion extension.  10 reps. As well as gentle tendon glides pain-free for MC flexion, blocked intrinsic a fist and composite fist to 3 cm foam block 10 reps Attempted kinesiotaping for thumb patient with increased pain patient fitted with a CMC neoprene that can support the wrist but also the base of the thumb and thenar eminence.  Patient could not tolerate strap thu webspace  -but could tolerate thumb piece.  Modified bulkiness in  webspace.  Patient to try and wear it with home exercises.  As well as during the day on and off as well as nighttime if decreasing symptoms. Patient in the past did use Voltaren patient can use that in combination with modalities as well as splint wearing to decrease pain   PATIENT EDUCATION: Education details: findings of eval and HEP  Person educated: Patient Education method: Explanation, Demonstration, Tactile cues, Verbal cues, and Handouts Education comprehension: verbalized understanding, returned demonstration, verbal cues required, and needs further education    GOALS: Goals reviewed with patient? Yes   LONG TERM GOALS: Target date: 8 wks  Patient and husband to be independent in home program for patient to decrease resting hand pain to less than 5/10. Baseline: No knowledge about splinting or modalities and home exercise program Goal status: INITIAL  2.  Right digits  AROM improve for patient to be able to touch palm Baseline: MC flexion 65 to 75 degrees PIP flexion 80 to 90 degrees with increased pain and increase edema Goal status: INITIAL  3.  Patient pain decreased to less than 3/10 at rest for patient t at wrist to initiate PROM and AAROM  range of motion to wrist flexion extension Baseline: Wrist extension 5 degrees flexion 25.  Pain 2-9/10 at rest  Goal status: INITIAL  4.  R thumb AROM increase to [facility] for pt to do buttons, write, hold cup  Baseline: Right thumb palmar radial abduction 45 to 50 degrees with pain.  Unable to maintain position.  Wearing tape to support thumb into adduction at base.  Opposition pain to 3rd through 5th. Goal status: INITIAL  5.  Right forearm improved for patient to turn doorknobs symptoms.  Baseline: Supination 63 degrees unable to use forearm Goal status: INITIAL  6.  Right wrist active and improved for patient to perform 50% of ADLs Baseline: Wrist severely limited radial deviation5 /ulnar deviation 10; extension 5 degrees of  flexion 25 with pain Goal status: INITIAL  ASSESSMENT:  CLINICAL IMPRESSION: Patient seen today for occupational therapy evaluation for right Colles' fracture on 2/18 for wrist 25.  Patient was seen and cast presented in a prefab splint because a lot of pain.  Patient had  few OT sessions but progress was limited by pain.  Patient referred by Dr. Windle Hatch with severe stiffness in right dominant hand and wrist as well as pain and numbness.  Patient has history of Ehlers-Denlos syndrome-per patient nerve conduction test showed minimal carpal tunnel.  Patient present at OT evaluation today with severe stiffness in wrist in all planes.  As well as pain in the thumb and digits.  Patient reports prior to injury patient active range of motion in right wrist and hand was within functional limits did had some pain in thumb and carpal bones.  Patient hypersensitive and tender to touch as well as soft tissue mobilization.  Unable to tolerate.  Patient was fitted with a CMC neoprene to support wrist as well as thumb but patient was unable to tolerate strap through webspace.  Patient to use modalities to decrease pain, edema and increased motion in combination with splint use and medication for pain.  Patient can benefit from skilled OT services to decrease pain and edema and increase motion and strength to return to prior level of function.  PERFORMANCE DEFICITS: in functional skills including ADLs, IADLs, ROM, strength, pain, flexibility, decreased knowledge of use of DME, and UE functional use,   and psychosocial skills including environmental adaptation and routines and behaviors.   IMPAIRMENTS: are limiting patient from ADLs, IADLs, rest and sleep, play, leisure, and social participation.   COMORBIDITIES: has no other co-morbidities that affects occupational performance. Patient will benefit from skilled OT to address above impairments and improve overall function.  MODIFICATION OR ASSISTANCE TO COMPLETE  EVALUATION: No modification of tasks or assist necessary to complete an evaluation.  OT OCCUPATIONAL PROFILE AND HISTORY: Problem focused assessment: Including review of records relating to presenting problem.  CLINICAL DECISION MAKING: LOW - limited treatment options, no task modification necessary  REHAB POTENTIAL: Good for goals  EVALUATION COMPLEXITY: Low     PLAN:  OT FREQUENCY: 1-2x/week  OT DURATION: 8 weeks  PLANNED INTERVENTIONS: 97168 OT Re-evaluation, 97535 self care/ADL training, 09811 therapeutic exercise, 97530 therapeutic activity, 97140 manual therapy, 97018 paraffin, 91478 fluidotherapy, 97034 contrast bath, 97033 iontophoresis, passive range of motion, patient/family education, and DME and/or AE instructions    CONSULTED AND AGREED WITH PLAN OF CARE: Patient     Heloise Lobo, OTR/L,CLT 02/02/2024, 7:04 PM

## 2024-02-02 ENCOUNTER — Encounter: Payer: Self-pay | Admitting: Occupational Therapy

## 2024-02-02 ENCOUNTER — Ambulatory Visit: Attending: Sports Medicine | Admitting: Occupational Therapy

## 2024-02-02 DIAGNOSIS — M6281 Muscle weakness (generalized): Secondary | ICD-10-CM | POA: Diagnosis present

## 2024-02-02 DIAGNOSIS — M79641 Pain in right hand: Secondary | ICD-10-CM | POA: Diagnosis present

## 2024-02-02 DIAGNOSIS — M25641 Stiffness of right hand, not elsewhere classified: Secondary | ICD-10-CM | POA: Insufficient documentation

## 2024-02-02 DIAGNOSIS — M25531 Pain in right wrist: Secondary | ICD-10-CM | POA: Insufficient documentation

## 2024-02-02 DIAGNOSIS — M25631 Stiffness of right wrist, not elsewhere classified: Secondary | ICD-10-CM | POA: Insufficient documentation

## 2024-02-09 ENCOUNTER — Telehealth: Payer: Self-pay

## 2024-02-09 ENCOUNTER — Other Ambulatory Visit (HOSPITAL_COMMUNITY): Payer: Self-pay

## 2024-02-09 NOTE — Telephone Encounter (Signed)
 Pharmacy Patient Advocate Encounter  Received notification from [facility] that Prior Authorization for Qulipta  10MG  tablets has been APPROVED from 02/09/24 to 02/08/25. Ran test claim, Copay is $0. This test claim was processed through [facility]- copay amounts may vary at other pharmacies due to pharmacy/plan contracts, or as the patient moves through the different stages of their insurance plan.   PA #/Case ID/Reference #: [id number]

## 2024-02-11 ENCOUNTER — Ambulatory Visit: Admitting: Occupational Therapy

## 2024-02-11 DIAGNOSIS — M79641 Pain in right hand: Secondary | ICD-10-CM | POA: Diagnosis not present

## 2024-02-11 DIAGNOSIS — M25641 Stiffness of right hand, not elsewhere classified: Secondary | ICD-10-CM

## 2024-02-11 DIAGNOSIS — M6281 Muscle weakness (generalized): Secondary | ICD-10-CM

## 2024-02-11 DIAGNOSIS — M25631 Stiffness of right wrist, not elsewhere classified: Secondary | ICD-10-CM

## 2024-02-11 DIAGNOSIS — M25531 Pain in right wrist: Secondary | ICD-10-CM

## 2024-02-11 NOTE — Therapy (Signed)
 OUTPATIENT OCCUPATIONAL THERAPY ORTHO TREATMENT  Patient Name: Autumn Zhang MRN: [id number] DOB:08/07/1971, 53 y.o., female Today's Date: 02/11/2024  PCP: Dr David Escort REFERRING PROVIDER: Dr Windle Hatch  END OF SESSION:  OT End of Session - 02/11/24 0917     Visit Number 2    Number of Visits 14    Date for OT Re-Evaluation 03/29/24    OT Start Time 0820    OT Stop Time 0908    OT Time Calculation (min) 48 min    Activity Tolerance Patient tolerated treatment well;Patient limited by pain    Behavior During Therapy [facility] for tasks assessed/performed             Past Medical History:  Diagnosis Date   Allergy    Anemia    Anxiety    Arthritis    Asthma    Attention deficit hyperactivity disorder (ADHD), combined type 10/18/2015   Diagnosed many years ago.  Has been doing well on Ritalin  5mg  QID to six times a day. Long acting hasn't worked well in the past and she has not wanted to try Adderall. She is taking wellbutrin as well, but she has been weaning medication due to SE migraine.  She is now down to Wellbutrin one quarter tablet daily.  Last Assessment & Plan:  A/P: Stable.  Continue current dosing.  Refilled 3 months   Basal cell carcinoma 05/16/2021   Superficial, R post lower leg. [facility] [place], [place]   Basal cell carcinoma 09/03/2006   L chest, R forearm. 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   Dysautonomia (HCC)    Dysplastic nevus 05/28/2009   right lateral back, [facility] [place], [place]   Dysplastic nevus 01/13/2023   left lower calf, mod to severe, shave removal 03/16/23, margins free 03/16/23   Ehlers-Danlos syndrome type III    Family history of colon cancer    GERD (gastroesophageal reflux disease)    Hyperlipidemia    Idiopathic small fiber peripheral  neuropathy    Leiomyoma    Mast cell activation (HCC)    Migraines    Neck pain    Occipital neuralgia    Sleep apnea    Past Surgical History:  Procedure Laterality Date   APPENDECTOMY     removed with colectomy   BREAST SURGERY     reduction   COLON SURGERY  2008   for colon cancer, ~1/3 of colon removed, primary reanastimosis   COLONOSCOPY WITH PROPOFOL  N/A 10/07/2018   Procedure: COLONOSCOPY WITH PROPOFOL ;  Surgeon: Selena Daily, MD;  Location: [facility] ENDOSCOPY;  Service: Gastroenterology;  Laterality: N/A;   COLONOSCOPY WITH PROPOFOL  N/A 08/14/2021   Procedure: COLONOSCOPY WITH PROPOFOL ;  Surgeon: Selena Daily, MD;  Location: [facility] ENDOSCOPY;  Service: Gastroenterology;  Laterality: N/A;   ENDOMETRIAL ABLATION     trigeminal stimulator     Patient Active Problem List   Diagnosis Date Noted   Psoriatic arthritis (HCC) 12/21/2023   MTHFR mutation 12/21/2023   Lymphedema 04/28/2023   Facial swelling 04/13/2023   Liver cyst 06/24/2022   Decreased appetite 06/24/2022   Generalized abdominal pain 06/24/2022   Abnormal weight gain 02/03/2022   Central obesity 02/03/2022   Impaired fasting glucose 02/03/2022   Cervical spondylosis with radiculopathy 10/09/2021   Psoriasis 10/08/2021  Exocrine pancreatic insufficiency 09/26/2021   Urinary frequency 09/26/2021   Atypical facial pain (1ry area of Pain) (Left) 08/29/2021   Chronic ear pain (Left) 08/29/2021   Temporal headache (Left) 08/29/2021   Inner ear pain (Left) 08/29/2021   Trigeminal nerve disorder (Left) 08/29/2021   History of photophobia 08/29/2021   TMJ (temporomandibular joint syndrome) 08/29/2021   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   Polypharmacy 08/29/2021   Severe frontal headaches 08/29/2021   Chronic occipital headache (Midline) (2ry area of Pain) 08/29/2021   Chronic neck pain (posterior) (4th area of Pain) (Bilateral) (L>R) 08/29/2021   Musculoskeletal disorder involving upper  trapezius muscle 08/29/2021   Musculoskeletal disorder involving sternocleidomastoid (Left) 08/29/2021   Abnormal MRI, cervical spine (02/11/2019) 08/29/2021   Shoulder stiffness (Right) 08/29/2021   Pharmacologic therapy 08/27/2021   Disorder of skeletal system 08/27/2021   Problems influencing health status 08/27/2021   GAD (generalized anxiety disorder) 07/17/2021   Difficulty walking 07/17/2021   Headache disorder (3ry area of Pain) 07/17/2021   Photophobia 07/17/2021   Difficulty with speech 07/17/2021   Tear film insufficiency 07/15/2021   Left lower quadrant abdominal pain 07/05/2021   Palpitations 06/11/2021   Axillary lymphadenopathy 06/11/2021   Immunosuppressed status (HCC) 04/19/2021   History of seronegative inflammatory arthritis 06/28/2020   Osteopenia of multiple sites 06/14/2020   Polyarthralgia 06/12/2020   Iodine deficiency 06/12/2020   OSA on CPAP 06/12/2020   Chronic pain syndrome 06/12/2020   Psychophysiological insomnia 11/26/2019   Calcific tendinitis of shoulder (Right) 04/10/2019   History of iron  deficiency anemia 11/04/2018   History of basal cell carcinoma 10/07/2018   Vaginal dryness 09/07/2018   Chronic jaw pain (Left) 07/15/2018   Chronic migraine with aura 07/15/2018   Ehlers-Danlos syndrome, type 3 07/15/2018   Rectocele 05/21/2018   Ehlers-Danlos syndrome 03/15/2018   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   Cardiac arrhythmia 01/21/2018   Polyp of colon 01/21/2018   Chronic constipation 01/21/2018   Instability of shoulder joint (Right) 01/12/2018   Trigeminal neuralgia (V1-3) (Left) 12/25/2017   Instability of shoulder joint (Left) 12/25/2017   Nausea 12/25/2017   Occipital neuralgia 12/25/2017   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   Hiatal hernia 10/14/2017   TMJ (dislocation of temporomandibular joint) 09/29/2017   Irritable bowel syndrome 07/02/2017   Anal fissure 07/02/2017   Anemia  07/02/2017   Gastroesophageal reflux disease 07/02/2017   Hemorrhoids 07/02/2017   Malignant neoplasm of skin 07/02/2017   Malignant tumor of colon (HCC) 07/02/2017   Arthritis 07/02/2017   Fibromyalgia 07/02/2017   Pulsatile tinnitus of ear (Left) 12/12/2016   Restless leg syndrome 08/01/2016   Cervical dystonia 03/21/2016   Cervical spine disease 03/21/2016   Cervicalgia 02/06/2016   Paresthesia 02/06/2016   Hypercholesterolemia 12/24/2015   Iron  deficiency anemia 10/18/2015   Vitamin D  deficiency disease 10/18/2015   Midline low back pain without sciatica 02/06/2015   Hyperglycemia 10/30/2014   History of colon cancer 09/27/2014   Postural orthostatic tachycardia syndrome 09/16/2003   Chronic fatigue 09/15/2000   Asthma 09/15/1988   Dyspnea 09/15/1988    ONSET DATE: 11/03/23  REFERRING DIAG: R wrist fx with numbness and pain   THERAPY DIAG:  Pain in right hand  Pain in right wrist  Stiffness of right hand, not elsewhere classified  Stiffness of right wrist, not elsewhere classified  Muscle weakness (generalized)  Rationale for Evaluation and Treatment: Rehabilitation  SUBJECTIVE:   SUBJECTIVE STATEMENT: I have this past week more pain  in my thumb and wrist.  And look at my wrist is sideways if I look this way -so how could they expected me to move my wrist back if it sideways.  I had to cut down the black brace and take the Velcro off because she is so sensitive.  Pt accompanied by: self  PERTINENT HISTORY: 01/13/24 ortho note Dr Windle Hatch  I personally reviewed and visualized the imaging studies if available.  - EMG/NCS RUE = evaluate for any acute nerve injury from her distal radius fracture causing her reported symptoms  Assessment:   ICD-10-CM  1. Closed Colles' fracture of right radius, initial encounter S52.531A  2. Stiffness of right wrist joint M25.631  3. Numbness and tingling in right hand R20.0  R20.2   Plan:   I have discussed the nature of  her current subjective complaints, clinical examination, test results and have reviewed treatment options. The plan is to do the following;  - The patient has ongoing right wrist pain, swelling, decreased motion, hand numbness and tingling, difficulty with hand function after a distal radius fracture on 11/03/2023. She seems to have been treated appropriately with splint followed by cast followed by brace then referred to occupational therapy. Some of her symptoms could simply be due to ordinary sequela I of injury and recovery has joint stiffness could be prolonged for up to 6-12 months. She could be developing carpal tunnel syndrome which can occur with a distal radius fracture as well. I am unsure about her ulnar sided fingers issue? This could be some nerve injury versus biomechanical abnormality because of significant loss to wrist motion versus developing complex regional pain syndrome. - She has had recent x-rays that show healing to her nondisplaced, extra-articular distal radius fracture. There is some osteopenia surrounding the joint which could be from disuse/immobilization. She has significant decreased her wrist motion. Pain limits a lot of examination. I discussed the normal recovery after distal radius fracture and how some of the things she is feeling can occur. Because of the complexity of her past medical history and her reported issues that seem neurologic, I recommended EMG ASAP to evaluate for any nerve damage. I also referred her to a different occupational therapist to see if they can provide any different treatments. Depending on the EMG results, she may need referral to neurosurgery versus hand surgery specialist versus general orthopedic surgeon. - Daily activity as tolerated. Modify activity as needed according to symptoms. No limitations to pain-free weight bearing. No need for immobilization or bracing. - Refer to OT for pain relieving modalities, manual techniques as indicated, home  exercise planning. Continue home exercise program to maintain strength, flexibility, and endurance. - Use chronic medications, over-the-counter medicines, topical diclofenac/pain cream, relative rest, compression, massage, and ice/heat as needed for pain.  - Follow up depending on EMG results. I did discuss that we could consider carpal tunnel steroid injection as a possible treatment.   PRECAUTIONS: none     WEIGHT BEARING RESTRICTIONS: No  PAIN:  Are you having pain? 2-9/10 radial wrist and thumb   FALLS: Has patient fallen in last 6 months? ?  LIVING ENVIRONMENT: Lives with: lives with their spouse    PLOF: on disability -patient occupation was International aid/development worker.  Because of her medical history she does not participate in cooking and laundry and cleaning.  But is active around the house.  Likes to walk.  On the computer and phone. Patient did prior to injury had pain at the thumb.  And at  the carpal bones at times.  Appear active range of motion was within functional limits   PATIENT GOALS: I want the pain better my right hand and wrist and get my motion and strength back so I can use my hand  NEXT MD VISIT: ?  OBJECTIVE:  Note: Objective measures were completed at Evaluation unless otherwise noted.  HAND DOMINANCE: Right  ADLs: Severe difficulty using right hand. Unable to grip, pull or push or hold or carry.  Limited by severe wrist stiffness.   FUNCTIONAL OUTCOME MEASURES: Next session  UPPER EXTREMITY ROM:     Active ROM Right eval Left eval R 02/11/24  Shoulder flexion     Shoulder abduction     Shoulder adduction     Shoulder extension     Shoulder internal rotation     Shoulder external rotation     Elbow flexion     Elbow extension     Wrist flexion 25  35  Wrist extension 5  10  Wrist ulnar deviation 10  15  Wrist radial deviation 5  8  Wrist pronation 90  90  Wrist supination 63  60  (Blank rows = not tested)  Active ROM Right eval Left eval  R 02/11/24  Thumb MCP (0-60) 50    Thumb IP (0-80) 50    Thumb Radial abd/add (0-55) 45     Thumb Palmar abd/add (0-45) 50     Thumb Opposition to Small Finger Opposition to all digits but increased pain 3rd through 5th     Index MCP (0-90) 65   70  Index PIP (0-100) 80   90  Index DIP (0-70)       Long MCP (0-90)  70   75  Long PIP (0-100)  90   95  Long DIP (0-70)       Ring MCP (0-90)  70   80  Ring PIP (0-100)  90   95  Ring DIP (0-70)       Little MCP (0-90)  75   90  Little PIP (0-100)  90   90  Little DIP (0-70)       (Blank rows = not tested)  HAND FUNCTION:  NT at eval Grip strength: Right:   lbs; Left:   lbs, Lateral pinch: Right:   lbs, Left:   lbs, and 3 point pinch: Right:   lbs, Left:   lbs  COORDINATION: Impaired because of severe stiffness in right hand digits and wrist   SENSATION: Patient report nerve conduction showed minimal carpal tunnel. Will further assess   EDEMA: Appeared to have some edema in the digits as well as the hand.  COGNITION: Overall cognitive status: Within functional limits for tasks assessed     TREATMENT DATE: 02/11/24     Patient arrived with reports of noticing wrist this more ulnar deviated.  Limiting her extension and causing pain.  Patient continues to be hypersensitive and tender at the thumb webspace as well as thumb CMC and radial wrist.  Not tolerating any pressure or joint mobs Also not tolerating the CMC neoprene as much took some of the Velcro out.  Discussed with patient she is 12 weeks out immobilized not tolerating therapy so really supertight.  Not tolerating soft tissue. Done desensitization assessment.  Patient tolerating soft massage soft textures good.  She could tolerate rougher textures and tapping.  Patient to do several times during the day rougher textures, when showering drying with a towel as well  as tapping on right hand wrist and forearm. Reviewed with patient to continue to focus also on scapular  squeezes 5 times a day 10 reps as well as to 3 times a day some active assisted range of motion on the wall for shoulder flexion and abduction. Elbow flexion 15 reps 3 positions  Assess active range of motion to the right hand and wrist.  See flowsheet patient making progress                                                                                                                           Modalities: Done fluidotherapy today 8 minutes performing thumb palmar radial abduction, flexion of digits as well as wrist supination and flexion extension.  Patient report decreased pain able to tolerate exercises better in the Fluidil.  Patient to continue contrast to 3 times a day.  But can do warm water or whirlpool bathtub for heat  Abdomen review soft tissue massage using red foam roller to volar hand wrist and forearm.  20 reps Patient was able to tolerate gentle traction and mope's on 2nd through 5th metacarpals and digits. As well as gentle traction in joint mope's on the ulnar carpal rows. Reviewed with patient husband-was able to tolerate can do after soft tissue massage and contrast prior to range of motion at home to 3 times a day  Cont active assisted range of motion for supination pronation, radial ulnar deviation wrist flexion extension.  10 reps. As well as gentle tendon glides pain-free for MC flexion, blocked intrinsic a fist and composite fist to 3 cm foam block 10 reps Patient to continue with CMC neoprene without thumb strap Can wear your day on and off as well as nighttime if decreasing symptoms. Patient in the past did use Voltaren patient can use that in combination with modalities as well as splint wearing to decrease pain  Provide patient built-up handles to put on her utensils to initiate use of right hand for eating.  As well as brushing teeth   PATIENT EDUCATION: Education details: findings of eval and HEP  Person educated: Patient Education method: Explanation,  Demonstration, Tactile cues, Verbal cues, and Handouts Education comprehension: verbalized understanding, returned demonstration, verbal cues required, and needs further education    GOALS: Goals reviewed with patient? Yes   LONG TERM GOALS: Target date: 8 wks  Patient and husband to be independent in home program for patient to decrease resting hand pain to less than 5/10. Baseline: No knowledge about splinting or modalities and home exercise program Goal status: INITIAL  2.  Right digits  AROM improve for patient to be able to touch palm Baseline: MC flexion 65 to 75 degrees PIP flexion 80 to 90 degrees with increased pain and increase edema Goal status: INITIAL  3.  Patient pain decreased to less than 3/10 at rest for patient t at wrist to initiate PROM and AAROM  range of motion to wrist flexion extension Baseline: Wrist  extension 5 degrees flexion 25.  Pain 2-9/10 at rest  Goal status: INITIAL  4.  R thumb AROM increase to [facility] for pt to do buttons, write, hold cup  Baseline: Right thumb palmar radial abduction 45 to 50 degrees with pain.  Unable to maintain position.  Wearing tape to support thumb into adduction at base.  Opposition pain to 3rd through 5th. Goal status: INITIAL  5.  Right forearm improved for patient to turn doorknobs symptoms.  Baseline: Supination 63 degrees unable to use forearm Goal status: INITIAL  6.  Right wrist active and improved for patient to perform 50% of ADLs Baseline: Wrist severely limited radial deviation5 /ulnar deviation 10; extension 5 degrees of flexion 25 with pain Goal status: INITIAL  ASSESSMENT:  CLINICAL IMPRESSION: Patient seen  for occupational therapy  for right Colles' fracture on 11/03/23.  Patient had a cast t presented in a prefab splint because a lot of pain.  Patient had  few OT sessions but progress was limited by pain.  Patient referred by Dr. Windle Hatch with severe stiffness in right dominant hand and wrist as well as pain  and numbness.  Patient has history of Ehlers-Denlos syndrome-per patient nerve conduction test showed minimal carpal tunnel.  Patient present at OT evaluation today with severe stiffness in wrist in all planes.  As well as pain in the thumb and digits.  Patient reports prior to injury patient active range of motion in right wrist and hand was within functional limits did had some pain in thumb and carpal bones.  Patient hypersensitive and tender to touch as well as soft tissue mobilization.  Unable to tolerate.   NOW patient present today with continues increased pain and tenderness over the thumb or radial wrist.  Was able to initiate some soft tissue massage for patient to do at home as well as  mobs to 2nd through fifth digits  and MC's as well as ulnar wrist prior to patient performing active assisted range of motion.  Patient to continue with  CMC neoprene to support wrist as well as thumb but patient was unable to tolerate strap through webspace.  Patient to use modalities to decrease pain, edema -and increased motion in combination with splint use and medication for pain.  Patient can benefit from skilled OT services to decrease pain and edema and increase motion and strength to return to prior level of function.  PERFORMANCE DEFICITS: in functional skills including ADLs, IADLs, ROM, strength, pain, flexibility, decreased knowledge of use of DME, and UE functional use,   and psychosocial skills including environmental adaptation and routines and behaviors.   IMPAIRMENTS: are limiting patient from ADLs, IADLs, rest and sleep, play, leisure, and social participation.   COMORBIDITIES: has no other co-morbidities that affects occupational performance. Patient will benefit from skilled OT to address above impairments and improve overall function.  MODIFICATION OR ASSISTANCE TO COMPLETE EVALUATION: No modification of tasks or assist necessary to complete an evaluation.  OT OCCUPATIONAL PROFILE AND  HISTORY: Problem focused assessment: Including review of records relating to presenting problem.  CLINICAL DECISION MAKING: LOW - limited treatment options, no task modification necessary  REHAB POTENTIAL: Good for goals  EVALUATION COMPLEXITY: Low     PLAN:  OT FREQUENCY: 1-2x/week  OT DURATION: 8 weeks  PLANNED INTERVENTIONS: 97168 OT Re-evaluation, 97535 self care/ADL training, 16109 therapeutic exercise, 97530 therapeutic activity, 97140 manual therapy, 97018 paraffin, 60454 fluidotherapy, 97034 contrast bath, 97033 iontophoresis, passive range of motion, patient/family education, and DME and/or  AE instructions    CONSULTED AND AGREED WITH PLAN OF CARE: Patient     Heloise Lobo, OTR/L,CLT 02/11/2024, 9:18 AM

## 2024-02-15 ENCOUNTER — Ambulatory Visit: Attending: Sports Medicine | Admitting: Occupational Therapy

## 2024-02-15 DIAGNOSIS — M6281 Muscle weakness (generalized): Secondary | ICD-10-CM | POA: Insufficient documentation

## 2024-02-15 DIAGNOSIS — M79641 Pain in right hand: Secondary | ICD-10-CM | POA: Insufficient documentation

## 2024-02-15 DIAGNOSIS — M25531 Pain in right wrist: Secondary | ICD-10-CM | POA: Diagnosis present

## 2024-02-15 DIAGNOSIS — M25641 Stiffness of right hand, not elsewhere classified: Secondary | ICD-10-CM | POA: Insufficient documentation

## 2024-02-15 DIAGNOSIS — M25631 Stiffness of right wrist, not elsewhere classified: Secondary | ICD-10-CM | POA: Insufficient documentation

## 2024-02-15 NOTE — Therapy (Signed)
 OUTPATIENT OCCUPATIONAL THERAPY ORTHO TREATMENT  Patient Name: Autumn Zhang MRN: [id number] DOB:[birth year], 53 y.o., female Today's Date: 02/15/2024  PCP: Dr David Escort REFERRING PROVIDER: Dr Windle Hatch  END OF SESSION:  OT End of Session - 02/15/24 0951     Visit Number 3    Number of Visits 14    Date for OT Re-Evaluation 03/29/24    OT Start Time 0951    OT Stop Time 1035    OT Time Calculation (min) 44 min    Activity Tolerance Patient tolerated treatment well;Patient limited by pain    Behavior During Therapy [facility] for tasks assessed/performed             Past Medical History:  Diagnosis Date   Allergy    Anemia    Anxiety    Arthritis    Asthma    Attention deficit hyperactivity disorder (ADHD), combined type 10/18/2015   Diagnosed many years ago.  Has been doing well on Ritalin  5mg  QID to six times a day. Long acting hasn't worked well in the past and she has not wanted to try Adderall. She is taking wellbutrin as well, but she has been weaning medication due to SE migraine.  She is now down to Wellbutrin one quarter tablet daily.  Last Assessment & Plan:  A/P: Stable.  Continue current dosing.  Refilled 3 months   Basal cell carcinoma 05/16/2021   Superficial, R post lower leg. [facility] [place], [place]   Basal cell carcinoma 09/03/2006   L chest, R forearm. 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   Dysautonomia (HCC)    Dysplastic nevus 05/28/2009   right lateral back, [facility] [place], [place]   Dysplastic nevus 01/13/2023   left lower calf, mod to severe, shave removal 03/16/23, margins free 03/16/23   Ehlers-Danlos syndrome type III    Family history of colon cancer    GERD (gastroesophageal reflux disease)    Hyperlipidemia    Idiopathic small fiber peripheral  neuropathy    Leiomyoma    Mast cell activation (HCC)    Migraines    Neck pain    Occipital neuralgia    Sleep apnea    Past Surgical History:  Procedure Laterality Date   APPENDECTOMY     removed with colectomy   BREAST SURGERY     reduction   COLON SURGERY  2008   for colon cancer, ~1/3 of colon removed, primary reanastimosis   COLONOSCOPY WITH PROPOFOL  N/A 10/07/2018   Procedure: COLONOSCOPY WITH PROPOFOL ;  Surgeon: Selena Daily, MD;  Location: [facility] ENDOSCOPY;  Service: Gastroenterology;  Laterality: N/A;   COLONOSCOPY WITH PROPOFOL  N/A 08/14/2021   Procedure: COLONOSCOPY WITH PROPOFOL ;  Surgeon: Selena Daily, MD;  Location: [facility] ENDOSCOPY;  Service: Gastroenterology;  Laterality: N/A;   ENDOMETRIAL ABLATION     trigeminal stimulator     Patient Active Problem List   Diagnosis Date Noted   Psoriatic arthritis (HCC) 12/21/2023   MTHFR mutation 12/21/2023   Lymphedema 04/28/2023   Facial swelling 04/13/2023   Liver cyst 06/24/2022   Decreased appetite 06/24/2022   Generalized abdominal pain 06/24/2022   Abnormal weight gain 02/03/2022   Central obesity 02/03/2022   Impaired fasting glucose 02/03/2022   Cervical spondylosis with radiculopathy 10/09/2021   Psoriasis 10/08/2021  Exocrine pancreatic insufficiency 09/26/2021   Urinary frequency 09/26/2021   Atypical facial pain (1ry area of Pain) (Left) 08/29/2021   Chronic ear pain (Left) 08/29/2021   Temporal headache (Left) 08/29/2021   Inner ear pain (Left) 08/29/2021   Trigeminal nerve disorder (Left) 08/29/2021   History of photophobia 08/29/2021   TMJ (temporomandibular joint syndrome) 08/29/2021   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   Polypharmacy 08/29/2021   Severe frontal headaches 08/29/2021   Chronic occipital headache (Midline) (2ry area of Pain) 08/29/2021   Chronic neck pain (posterior) (4th area of Pain) (Bilateral) (L>R) 08/29/2021   Musculoskeletal disorder involving upper  trapezius muscle 08/29/2021   Musculoskeletal disorder involving sternocleidomastoid (Left) 08/29/2021   Abnormal MRI, cervical spine (02/11/2019) 08/29/2021   Shoulder stiffness (Right) 08/29/2021   Pharmacologic therapy 08/27/2021   Disorder of skeletal system 08/27/2021   Problems influencing health status 08/27/2021   GAD (generalized anxiety disorder) 07/17/2021   Difficulty walking 07/17/2021   Headache disorder (3ry area of Pain) 07/17/2021   Photophobia 07/17/2021   Difficulty with speech 07/17/2021   Tear film insufficiency 07/15/2021   Left lower quadrant abdominal pain 07/05/2021   Palpitations 06/11/2021   Axillary lymphadenopathy 06/11/2021   Immunosuppressed status (HCC) 04/19/2021   History of seronegative inflammatory arthritis 06/28/2020   Osteopenia of multiple sites 06/14/2020   Polyarthralgia 06/12/2020   Iodine deficiency 06/12/2020   OSA on CPAP 06/12/2020   Chronic pain syndrome 06/12/2020   Psychophysiological insomnia 11/26/2019   Calcific tendinitis of shoulder (Right) 04/10/2019   History of iron  deficiency anemia 11/04/2018   History of basal cell carcinoma 10/07/2018   Vaginal dryness 09/07/2018   Chronic jaw pain (Left) 07/15/2018   Chronic migraine with aura 07/15/2018   Ehlers-Danlos syndrome, type 3 07/15/2018   Rectocele 05/21/2018   Ehlers-Danlos syndrome 03/15/2018   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   Cardiac arrhythmia 01/21/2018   Polyp of colon 01/21/2018   Chronic constipation 01/21/2018   Instability of shoulder joint (Right) 01/12/2018   Trigeminal neuralgia (V1-3) (Left) 12/25/2017   Instability of shoulder joint (Left) 12/25/2017   Nausea 12/25/2017   Occipital neuralgia 12/25/2017   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   Hiatal hernia 10/14/2017   TMJ (dislocation of temporomandibular joint) 09/29/2017   Irritable bowel syndrome 07/02/2017   Anal fissure 07/02/2017   Anemia  07/02/2017   Gastroesophageal reflux disease 07/02/2017   Hemorrhoids 07/02/2017   Malignant neoplasm of skin 07/02/2017   Malignant tumor of colon (HCC) 07/02/2017   Arthritis 07/02/2017   Fibromyalgia 07/02/2017   Pulsatile tinnitus of ear (Left) 12/12/2016   Restless leg syndrome 08/01/2016   Cervical dystonia 03/21/2016   Cervical spine disease 03/21/2016   Cervicalgia 02/06/2016   Paresthesia 02/06/2016   Hypercholesterolemia 12/24/2015   Iron  deficiency anemia 10/18/2015   Vitamin D  deficiency disease 10/18/2015   Midline low back pain without sciatica 02/06/2015   Hyperglycemia 10/30/2014   History of colon cancer 09/27/2014   Postural orthostatic tachycardia syndrome 09/16/2003   Chronic fatigue 09/15/2000   Asthma 09/15/1988   Dyspnea 09/15/1988    ONSET DATE: 11/03/23  REFERRING DIAG: R wrist fx with numbness and pain   THERAPY DIAG:  Pain in right hand  Stiffness of right hand, not elsewhere classified  Pain in right wrist  Stiffness of right wrist, not elsewhere classified  Muscle weakness (generalized)  Rationale for Evaluation and Treatment: Rehabilitation  SUBJECTIVE:   SUBJECTIVE STATEMENT: I have some good progress since last  time - after doing the fluido -it gave me hope - switch to cold water  and heating pad and husband done some soft tissue massages-   Pt accompanied by: self  PERTINENT HISTORY: 01/13/24 ortho note Dr Windle Hatch  I personally reviewed and visualized the imaging studies if available.  - EMG/NCS RUE = evaluate for any acute nerve injury from her distal radius fracture causing her reported symptoms  Assessment:   ICD-10-CM  1. Closed Colles' fracture of right radius, initial encounter S52.531A  2. Stiffness of right wrist joint M25.631  3. Numbness and tingling in right hand R20.0  R20.2   Plan:   I have discussed the nature of her current subjective complaints, clinical examination, test results and have reviewed  treatment options. The plan is to do the following;  - The patient has ongoing right wrist pain, swelling, decreased motion, hand numbness and tingling, difficulty with hand function after a distal radius fracture on 11/03/2023. She seems to have been treated appropriately with splint followed by cast followed by brace then referred to occupational therapy. Some of her symptoms could simply be due to ordinary sequela I of injury and recovery has joint stiffness could be prolonged for up to 6-12 months. She could be developing carpal tunnel syndrome which can occur with a distal radius fracture as well. I am unsure about her ulnar sided fingers issue? This could be some nerve injury versus biomechanical abnormality because of significant loss to wrist motion versus developing complex regional pain syndrome. - She has had recent x-rays that show healing to her nondisplaced, extra-articular distal radius fracture. There is some osteopenia surrounding the joint which could be from disuse/immobilization. She has significant decreased her wrist motion. Pain limits a lot of examination. I discussed the normal recovery after distal radius fracture and how some of the things she is feeling can occur. Because of the complexity of her past medical history and her reported issues that seem neurologic, I recommended EMG ASAP to evaluate for any nerve damage. I also referred her to a different occupational therapist to see if they can provide any different treatments. Depending on the EMG results, she may need referral to neurosurgery versus hand surgery specialist versus general orthopedic surgeon. - Daily activity as tolerated. Modify activity as needed according to symptoms. No limitations to pain-free weight bearing. No need for immobilization or bracing. - Refer to OT for pain relieving modalities, manual techniques as indicated, home exercise planning. Continue home exercise program to maintain strength, flexibility,  and endurance. - Use chronic medications, over-the-counter medicines, topical diclofenac/pain cream, relative rest, compression, massage, and ice/heat as needed for pain.  - Follow up depending on EMG results. I did discuss that we could consider carpal tunnel steroid injection as a possible treatment.   PRECAUTIONS: none     WEIGHT BEARING RESTRICTIONS: No  PAIN:  Are you having pain? 3/10 radial wrist and thumb at rest increase to 8/10   FALLS: Has patient fallen in last 6 months? ?  LIVING ENVIRONMENT: Lives with: lives with their spouse    PLOF: on disability -patient occupation was International aid/development worker.  Because of her medical history she does not participate in cooking and laundry and cleaning.  But is active around the house.  Likes to walk.  On the computer and phone. Patient did prior to injury had pain at the thumb.  And at the carpal bones at times.  Appear active range of motion was within functional limits   PATIENT GOALS:  I want the pain better my right hand and wrist and get my motion and strength back so I can use my hand  NEXT MD VISIT: ?  OBJECTIVE:  Note: Objective measures were completed at Evaluation unless otherwise noted.  HAND DOMINANCE: Right  ADLs: Severe difficulty using right hand. Unable to grip, pull or push or hold or carry.  Limited by severe wrist stiffness.   FUNCTIONAL OUTCOME MEASURES: Next session  UPPER EXTREMITY ROM:     Active ROM Right eval Left eval R 02/11/24 R 02/15/24  Shoulder flexion      Shoulder abduction      Shoulder adduction      Shoulder extension      Shoulder internal rotation      Shoulder external rotation      Elbow flexion      Elbow extension      Wrist flexion 25  35 40  Wrist extension 5  10 20   Wrist ulnar deviation 10  15 18   Wrist radial deviation 5  8 12   Wrist pronation 90  90 90  Wrist supination 63  60 60  (Blank rows = not tested)  Active ROM Right eval Left eval R 02/11/24 R 02/15/24  Thumb  MCP (0-60) 50     Thumb IP (0-80) 50     Thumb Radial abd/add (0-55) 45    45  Thumb Palmar abd/add (0-45) 50    50  Thumb Opposition to Small Finger Opposition to all digits but increased pain 3rd through 5th    Opposition to all digits  Index MCP (0-90) 65   70 65  Index PIP (0-100) 80   90 95  Index DIP (0-70)        Long MCP (0-90)  70   75 85  Long PIP (0-100)  90   95 95  Long DIP (0-70)        Ring MCP (0-90)  70   80 90  Ring PIP (0-100)  90   95 100  Ring DIP (0-70)        Little MCP (0-90)  75   90 95  Little PIP (0-100)  90   90 100  Little DIP (0-70)        (Blank rows = not tested)  HAND FUNCTION:  NT at eval Grip strength: Right:   lbs; Left:   lbs, Lateral pinch: Right:   lbs, Left:   lbs, and 3 point pinch: Right:   lbs, Left:   lbs  COORDINATION: Impaired because of severe stiffness in right hand digits and wrist   SENSATION: Patient report nerve conduction showed minimal carpal tunnel. Will further assess   EDEMA: Appeared to have some edema in the digits as well as the hand.  COGNITION: Overall cognitive status: Within functional limits for tasks assessed     TREATMENT DATE: 02/15/24    Patient arrived with reports of increased motion since last time.  Decreased pain Switched to cold water during contrast and husband assisted with soft tissue massage   Patient show this day decrease hypersensitivity able to tolerate different textures, tapping and able to clap hands.  Patient continues to show progress in wrist motion as well as digit flexion and extension.    Patient continues to be hypersensitive and tender at the thumb webspace as well as thumb CMC -able to tolerate light pressure today.  As well as light clamp 3 x 20 seconds. Husband to do light pressure  prior to thumb CMC palmar radial abduction    Done desensitization assessment. This date done and was able to tolerate after fluidotherapy Graston tool #6 on volar hand as well as digits prior  to tendon glides  Was able to tolerate soft tissue massage by OT as well as Graston tool #2 for brushing and sweeping over volar forearm.  Not wrist.    patient to continue to focus also on scapular squeezes 5 times a day 10 reps as well as to 3 times a day some active assisted range of motion on the wall for shoulder flexion and abduction. Elbow flexion 15 reps 3 positions  Assess active range of motion to the right hand and wrist.  See flowsheet patient making progress                                                                                                                           Modalities: Done fluidotherapy today 8 minutes performing thumb palmar radial abduction, flexion of digits as well as wrist supination and flexion extension.  Patient report decreased pain able to tolerate exercises better in the Fluidotherapy Pt to continue doing soft tissue massage over volar hand and wrist at home right lower  Patient was able to tolerate gentle traction and mobs on 2nd through 5th metacarpals and digits. As well as gentle traction in joint mobs on the ulnar carpal rows. Reviewed with patient husband-was able to tolerate can do after soft tissue massage and contrast prior to range of motion at home to 3 times a day Reviewed with husband and patient to do as well as in the clinic active assisted range of motion for supination focusing on forearm 10 reps followed by 5 placing holds  Cont active range of motion for supination pronation,  AARON radial ulnar deviation wrist flexion extension.  10 reps. This date reviewed and add for patient active assisted range of motion over the edge of the table for wrist flexion extension 10 reps-moving elbow and wrist Continue  tendon glides pain-free for MC flexion, blocked intrinsic a fist and composite fist to palm 10 reps  patient to continue with CMC neoprene without thumb strap Can wear your day on and off as well as nighttime if decreasing  symptoms. Patient in the past did use Voltaren patient can use that in combination with modalities as well as splint wearing to decrease pain  Provide patient built-up handles to put on her utensils to initiate use of right hand for eating.  As well as brushing teeth   PATIENT EDUCATION: Education details: findings of eval and HEP  Person educated: Patient Education method: Explanation, Demonstration, Tactile cues, Verbal cues, and Handouts Education comprehension: verbalized understanding, returned demonstration, verbal cues required, and needs further education    GOALS: Goals reviewed with patient? Yes   LONG TERM GOALS: Target date: 8 wks  Patient and husband to be independent in home program for patient to decrease resting hand pain to  less than 5/10. Baseline: No knowledge about splinting or modalities and home exercise program Goal status: INITIAL  2.  Right digits  AROM improve for patient to be able to touch palm Baseline: MC flexion 65 to 75 degrees PIP flexion 80 to 90 degrees with increased pain and increase edema Goal status: INITIAL  3.  Patient pain decreased to less than 3/10 at rest for patient t at wrist to initiate PROM and AAROM  range of motion to wrist flexion extension Baseline: Wrist extension 5 degrees flexion 25.  Pain 2-9/10 at rest  Goal status: INITIAL  4.  R thumb AROM increase to [facility] for pt to do buttons, write, hold cup  Baseline: Right thumb palmar radial abduction 45 to 50 degrees with pain.  Unable to maintain position.  Wearing tape to support thumb into adduction at base.  Opposition pain to 3rd through 5th. Goal status: INITIAL  5.  Right forearm improved for patient to turn doorknobs symptoms.  Baseline: Supination 63 degrees unable to use forearm Goal status: INITIAL  6.  Right wrist active and improved for patient to perform 50% of ADLs Baseline: Wrist severely limited radial deviation5 /ulnar deviation 10; extension 5 degrees of  flexion 25 with pain Goal status: INITIAL  ASSESSMENT:  CLINICAL IMPRESSION: Patient seen  for occupational therapy  for right Colles' fracture on 11/03/23.  Patient had a cast t presented in a prefab splint because a lot of pain.  Patient had  few OT sessions but progress was limited by pain.  Patient referred by Dr. Windle Hatch with severe stiffness in right dominant hand and wrist as well as pain and numbness.  Patient has history of Ehlers-Denlos syndrome-per patient nerve conduction test showed minimal carpal tunnel.  Patient present at OT evaluation today with severe stiffness in wrist in all planes.  As well as pain in the thumb and digits.  Patient reports prior to injury patient active range of motion in right wrist and hand was within functional limits did had some pain in thumb and carpal bones.  Patient hypersensitive and tender to touch as well as soft tissue mobilization.  Unable to tolerate.   NOW patient  continues increased pain and tenderness over the thumb or radial wrist.  But patient improving in wrist and digits AROM- patient able to tolerate more soft tissue/and mobs to volar wrist and hand and digits prior to active assisted.  Patient to focus on supination as well as wrist flexion extension for next session -patient able to tolerate light pressure this date on the webspace prior to thumb palmar radial abduction -patient to continue with  CMC neoprene to support wrist as well as thumb -  Patient to use modalities to decrease pain, edema -and increased motion in combination with splint use and medication for pain.  Patient can benefit from skilled OT services to decrease pain and edema and increase motion and strength to return to prior level of function.  PERFORMANCE DEFICITS: in functional skills including ADLs, IADLs, ROM, strength, pain, flexibility, decreased knowledge of use of DME, and UE functional use,   and psychosocial skills including environmental adaptation and routines and  behaviors.   IMPAIRMENTS: are limiting patient from ADLs, IADLs, rest and sleep, play, leisure, and social participation.   COMORBIDITIES: has no other co-morbidities that affects occupational performance. Patient will benefit from skilled OT to address above impairments and improve overall function.  MODIFICATION OR ASSISTANCE TO COMPLETE EVALUATION: No modification of tasks or assist necessary to  complete an evaluation.  OT OCCUPATIONAL PROFILE AND HISTORY: Problem focused assessment: Including review of records relating to presenting problem.  CLINICAL DECISION MAKING: LOW - limited treatment options, no task modification necessary  REHAB POTENTIAL: Good for goals  EVALUATION COMPLEXITY: Low     PLAN:  OT FREQUENCY: 1-2x/week  OT DURATION: 8 weeks  PLANNED INTERVENTIONS: 97168 OT Re-evaluation, 97535 self care/ADL training, 21308 therapeutic exercise, 97530 therapeutic activity, 97140 manual therapy, 97018 paraffin, 65784 fluidotherapy, 97034 contrast bath, 97033 iontophoresis, passive range of motion, patient/family education, and DME and/or AE instructions    CONSULTED AND AGREED WITH PLAN OF CARE: Patient     Heloise Lobo, OTR/L,CLT 02/15/2024, 10:46 AM

## 2024-02-17 ENCOUNTER — Encounter: Payer: Self-pay | Admitting: Oncology

## 2024-02-17 ENCOUNTER — Other Ambulatory Visit: Payer: Self-pay | Admitting: *Deleted

## 2024-02-17 DIAGNOSIS — D509 Iron deficiency anemia, unspecified: Secondary | ICD-10-CM

## 2024-02-18 ENCOUNTER — Inpatient Hospital Stay: Attending: Oncology

## 2024-02-18 ENCOUNTER — Other Ambulatory Visit

## 2024-02-18 ENCOUNTER — Ambulatory Visit: Payer: Self-pay | Admitting: Oncology

## 2024-02-18 DIAGNOSIS — Z862 Personal history of diseases of the blood and blood-forming organs and certain disorders involving the immune mechanism: Secondary | ICD-10-CM

## 2024-02-18 DIAGNOSIS — D509 Iron deficiency anemia, unspecified: Secondary | ICD-10-CM | POA: Insufficient documentation

## 2024-02-18 LAB — IRON AND TIBC
Iron: 56 ug/dL (ref 28–170)
Saturation Ratios: 15 % (ref 10.4–31.8)
TIBC: 372 ug/dL (ref 250–450)
UIBC: 316 ug/dL

## 2024-02-18 LAB — CBC WITH DIFFERENTIAL (CANCER CENTER ONLY)
Abs Immature Granulocytes: 0.02 10*3/uL (ref 0.00–0.07)
Basophils Absolute: 0.1 10*3/uL (ref 0.0–0.1)
Basophils Relative: 1 %
Eosinophils Absolute: 0.2 10*3/uL (ref 0.0–0.5)
Eosinophils Relative: 2 %
HCT: 38 % (ref 36.0–46.0)
Hemoglobin: 12.2 g/dL (ref 12.0–15.0)
Immature Granulocytes: 0 %
Lymphocytes Relative: 26 %
Lymphs Abs: 1.6 10*3/uL (ref 0.7–4.0)
MCH: 29.9 pg (ref 26.0–34.0)
MCHC: 32.1 g/dL (ref 30.0–36.0)
MCV: 93.1 fL (ref 80.0–100.0)
Monocytes Absolute: 0.6 10*3/uL (ref 0.1–1.0)
Monocytes Relative: 9 %
Neutro Abs: 3.9 10*3/uL (ref 1.7–7.7)
Neutrophils Relative %: 62 %
Platelet Count: 279 10*3/uL (ref 150–400)
RBC: 4.08 MIL/uL (ref 3.87–5.11)
RDW: 12.5 % (ref 11.5–15.5)
WBC Count: 6.3 10*3/uL (ref 4.0–10.5)
nRBC: 0 % (ref 0.0–0.2)

## 2024-02-18 LAB — FERRITIN: Ferritin: 91 ng/mL (ref 11–307)

## 2024-02-19 ENCOUNTER — Encounter: Payer: Self-pay | Admitting: Oncology

## 2024-02-19 NOTE — Telephone Encounter (Signed)
 Per Dr. Randy Buttery "Ferritin > 75. Iron  saturation has always been < 20%. I dont think she needs iv iron . Can you call her?".  Outbound call to patient; informed of above.  Feels better than she was; reviewed upcoming lab and MD appointment on 04/29/24; patient stated that is about the time she thinks she'll have to have her labs checked.  No further questions / concerns at this time.

## 2024-02-19 NOTE — Telephone Encounter (Signed)
-----   Message from Avonne Boettcher sent at 02/18/2024  9:14 PM EDT ----- Ferritin > 75. Iron  saturation has always been < 20%. I dont think she needs iv iron . Can you call her?

## 2024-02-23 ENCOUNTER — Encounter: Payer: Self-pay | Admitting: Family Medicine

## 2024-02-23 ENCOUNTER — Ambulatory Visit: Admitting: Occupational Therapy

## 2024-02-23 DIAGNOSIS — M25641 Stiffness of right hand, not elsewhere classified: Secondary | ICD-10-CM

## 2024-02-23 DIAGNOSIS — M25631 Stiffness of right wrist, not elsewhere classified: Secondary | ICD-10-CM

## 2024-02-23 DIAGNOSIS — M25531 Pain in right wrist: Secondary | ICD-10-CM

## 2024-02-23 DIAGNOSIS — M79641 Pain in right hand: Secondary | ICD-10-CM

## 2024-02-23 DIAGNOSIS — M6281 Muscle weakness (generalized): Secondary | ICD-10-CM

## 2024-02-23 NOTE — Therapy (Signed)
 OUTPATIENT OCCUPATIONAL THERAPY ORTHO TREATMENT  Patient Name: Autumn Zhang MRN: [id number] DOB:Feb 01, 1971, 53 y.o., female Today's Date: 02/23/2024  PCP: Dr David Escort REFERRING PROVIDER: Dr Windle Hatch  END OF SESSION:  OT End of Session - 02/23/24 1202     Visit Number 4    Number of Visits 14    Date for OT Re-Evaluation 03/29/24    OT Start Time 1202    OT Stop Time 1301    OT Time Calculation (min) 59 min    Activity Tolerance Patient tolerated treatment well;Patient limited by pain    Behavior During Therapy [facility] for tasks assessed/performed             Past Medical History:  Diagnosis Date   Allergy    Anemia    Anxiety    Arthritis    Asthma    Attention deficit hyperactivity disorder (ADHD), combined type 10/18/2015   Diagnosed many years ago.  Has been doing well on Ritalin  5mg  QID to six times a day. Long acting hasn't worked well in the past and she has not wanted to try Adderall. She is taking wellbutrin as well, but she has been weaning medication due to SE migraine.  She is now down to Wellbutrin one quarter tablet daily.  Last Assessment & Plan:  A/P: Stable.  Continue current dosing.  Refilled 3 months   Basal cell carcinoma 05/16/2021   Superficial, R post lower leg. [facility] [place], [place]   Basal cell carcinoma 09/03/2006   L chest, R forearm. 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   Dysautonomia (HCC)    Dysplastic nevus 05/28/2009   right lateral back, [facility] [place], [place]   Dysplastic nevus 01/13/2023   left lower calf, mod to severe, shave removal 03/16/23, margins free 03/16/23   Ehlers-Danlos syndrome type III    Family history of colon cancer    GERD (gastroesophageal reflux disease)    Hyperlipidemia    Idiopathic small fiber peripheral  neuropathy    Leiomyoma    Mast cell activation (HCC)    Migraines    Neck pain    Occipital neuralgia    Sleep apnea    Past Surgical History:  Procedure Laterality Date   APPENDECTOMY     removed with colectomy   BREAST SURGERY     reduction   COLON SURGERY  2008   for colon cancer, ~1/3 of colon removed, primary reanastimosis   COLONOSCOPY WITH PROPOFOL  N/A 10/07/2018   Procedure: COLONOSCOPY WITH PROPOFOL ;  Surgeon: Selena Daily, MD;  Location: [facility] ENDOSCOPY;  Service: Gastroenterology;  Laterality: N/A;   COLONOSCOPY WITH PROPOFOL  N/A 08/14/2021   Procedure: COLONOSCOPY WITH PROPOFOL ;  Surgeon: Selena Daily, MD;  Location: [facility] ENDOSCOPY;  Service: Gastroenterology;  Laterality: N/A;   ENDOMETRIAL ABLATION     trigeminal stimulator     Patient Active Problem List   Diagnosis Date Noted   Psoriatic arthritis (HCC) 12/21/2023   MTHFR mutation 12/21/2023   Lymphedema 04/28/2023   Facial swelling 04/13/2023   Liver cyst 06/24/2022   Decreased appetite 06/24/2022   Generalized abdominal pain 06/24/2022   Abnormal weight gain 02/03/2022   Central obesity 02/03/2022   Impaired fasting glucose 02/03/2022   Cervical spondylosis with radiculopathy 10/09/2021   Psoriasis 10/08/2021  Exocrine pancreatic insufficiency 09/26/2021   Urinary frequency 09/26/2021   Atypical facial pain (1ry area of Pain) (Left) 08/29/2021   Chronic ear pain (Left) 08/29/2021   Temporal headache (Left) 08/29/2021   Inner ear pain (Left) 08/29/2021   Trigeminal nerve disorder (Left) 08/29/2021   History of photophobia 08/29/2021   TMJ (temporomandibular joint syndrome) 08/29/2021   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   Polypharmacy 08/29/2021   Severe frontal headaches 08/29/2021   Chronic occipital headache (Midline) (2ry area of Pain) 08/29/2021   Chronic neck pain (posterior) (4th area of Pain) (Bilateral) (L>R) 08/29/2021   Musculoskeletal disorder involving upper  trapezius muscle 08/29/2021   Musculoskeletal disorder involving sternocleidomastoid (Left) 08/29/2021   Abnormal MRI, cervical spine (02/11/2019) 08/29/2021   Shoulder stiffness (Right) 08/29/2021   Pharmacologic therapy 08/27/2021   Disorder of skeletal system 08/27/2021   Problems influencing health status 08/27/2021   GAD (generalized anxiety disorder) 07/17/2021   Difficulty walking 07/17/2021   Headache disorder (3ry area of Pain) 07/17/2021   Photophobia 07/17/2021   Difficulty with speech 07/17/2021   Tear film insufficiency 07/15/2021   Left lower quadrant abdominal pain 07/05/2021   Palpitations 06/11/2021   Axillary lymphadenopathy 06/11/2021   Immunosuppressed status (HCC) 04/19/2021   History of seronegative inflammatory arthritis 06/28/2020   Osteopenia of multiple sites 06/14/2020   Polyarthralgia 06/12/2020   Iodine deficiency 06/12/2020   OSA on CPAP 06/12/2020   Chronic pain syndrome 06/12/2020   Psychophysiological insomnia 11/26/2019   Calcific tendinitis of shoulder (Right) 04/10/2019   History of iron  deficiency anemia 11/04/2018   History of basal cell carcinoma 10/07/2018   Vaginal dryness 09/07/2018   Chronic jaw pain (Left) 07/15/2018   Chronic migraine with aura 07/15/2018   Ehlers-Danlos syndrome, type 3 07/15/2018   Rectocele 05/21/2018   Ehlers-Danlos syndrome 03/15/2018   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   Cardiac arrhythmia 01/21/2018   Polyp of colon 01/21/2018   Chronic constipation 01/21/2018   Instability of shoulder joint (Right) 01/12/2018   Trigeminal neuralgia (V1-3) (Left) 12/25/2017   Instability of shoulder joint (Left) 12/25/2017   Nausea 12/25/2017   Occipital neuralgia 12/25/2017   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   Hiatal hernia 10/14/2017   TMJ (dislocation of temporomandibular joint) 09/29/2017   Irritable bowel syndrome 07/02/2017   Anal fissure 07/02/2017   Anemia  07/02/2017   Gastroesophageal reflux disease 07/02/2017   Hemorrhoids 07/02/2017   Malignant neoplasm of skin 07/02/2017   Malignant tumor of colon (HCC) 07/02/2017   Arthritis 07/02/2017   Fibromyalgia 07/02/2017   Pulsatile tinnitus of ear (Left) 12/12/2016   Restless leg syndrome 08/01/2016   Cervical dystonia 03/21/2016   Cervical spine disease 03/21/2016   Cervicalgia 02/06/2016   Paresthesia 02/06/2016   Hypercholesterolemia 12/24/2015   Iron  deficiency anemia 10/18/2015   Vitamin D  deficiency disease 10/18/2015   Midline low back pain without sciatica 02/06/2015   Hyperglycemia 10/30/2014   History of colon cancer 09/27/2014   Postural orthostatic tachycardia syndrome 09/16/2003   Chronic fatigue 09/15/2000   Asthma 09/15/1988   Dyspnea 09/15/1988    ONSET DATE: 11/03/23  REFERRING DIAG: R wrist fx with numbness and pain   THERAPY DIAG:  Pain in right hand  Pain in right wrist  Stiffness of right hand, not elsewhere classified  Stiffness of right wrist, not elsewhere classified  Muscle weakness (generalized)  Rationale for Evaluation and Treatment: Rehabilitation  SUBJECTIVE:   SUBJECTIVE STATEMENT: Fingers are doing good but I feel  like I need some stability at the thumb and down here at the wrist when trying to bend wrist back - rotation felt like I had some instability at the elbow   Pt accompanied by: self  PERTINENT HISTORY: 01/13/24 ortho note Dr Windle Hatch  I personally reviewed and visualized the imaging studies if available.  - EMG/NCS RUE = evaluate for any acute nerve injury from her distal radius fracture causing her reported symptoms  Assessment:   ICD-10-CM  1. Closed Colles' fracture of right radius, initial encounter S52.531A  2. Stiffness of right wrist joint M25.631  3. Numbness and tingling in right hand R20.0  R20.2   Plan:   I have discussed the nature of her current subjective complaints, clinical examination, test results and  have reviewed treatment options. The plan is to do the following;  - The patient has ongoing right wrist pain, swelling, decreased motion, hand numbness and tingling, difficulty with hand function after a distal radius fracture on 11/03/2023. She seems to have been treated appropriately with splint followed by cast followed by brace then referred to occupational therapy. Some of her symptoms could simply be due to ordinary sequela I of injury and recovery has joint stiffness could be prolonged for up to 6-12 months. She could be developing carpal tunnel syndrome which can occur with a distal radius fracture as well. I am unsure about her ulnar sided fingers issue? This could be some nerve injury versus biomechanical abnormality because of significant loss to wrist motion versus developing complex regional pain syndrome. - She has had recent x-rays that show healing to her nondisplaced, extra-articular distal radius fracture. There is some osteopenia surrounding the joint which could be from disuse/immobilization. She has significant decreased her wrist motion. Pain limits a lot of examination. I discussed the normal recovery after distal radius fracture and how some of the things she is feeling can occur. Because of the complexity of her past medical history and her reported issues that seem neurologic, I recommended EMG ASAP to evaluate for any nerve damage. I also referred her to a different occupational therapist to see if they can provide any different treatments. Depending on the EMG results, she may need referral to neurosurgery versus hand surgery specialist versus general orthopedic surgeon. - Daily activity as tolerated. Modify activity as needed according to symptoms. No limitations to pain-free weight bearing. No need for immobilization or bracing. - Refer to OT for pain relieving modalities, manual techniques as indicated, home exercise planning. Continue home exercise program to maintain strength,  flexibility, and endurance. - Use chronic medications, over-the-counter medicines, topical diclofenac/pain cream, relative rest, compression, massage, and ice/heat as needed for pain.  - Follow up depending on EMG results. I did discuss that we could consider carpal tunnel steroid injection as a possible treatment.   PRECAUTIONS: none     WEIGHT BEARING RESTRICTIONS: No  PAIN:  Are you having pain? 3/10 radial wrist , pisiforme and base of thumb   FALLS: Has patient fallen in last 6 months? ?  LIVING ENVIRONMENT: Lives with: lives with their spouse    PLOF: on disability -patient occupation was International aid/development worker.  Because of her medical history she does not participate in cooking and laundry and cleaning.  But is active around the house.  Likes to walk.  On the computer and phone. Patient did prior to injury had pain at the thumb.  And at the carpal bones at times.  Appear active range of motion was within functional limits  PATIENT GOALS: I want the pain better my right hand and wrist and get my motion and strength back so I can use my hand  NEXT MD VISIT: ?  OBJECTIVE:  Note: Objective measures were completed at Evaluation unless otherwise noted.  HAND DOMINANCE: Right  ADLs: Severe difficulty using right hand. Unable to grip, pull or push or hold or carry.  Limited by severe wrist stiffness.   FUNCTIONAL OUTCOME MEASURES: Next session  UPPER EXTREMITY ROM:     Active ROM Right eval Left eval R 02/11/24 R 02/15/24 R 02/23/24  Shoulder flexion       Shoulder abduction       Shoulder adduction       Shoulder extension       Shoulder internal rotation       Shoulder external rotation       Elbow flexion       Elbow extension       Wrist flexion 25  35 40 50  Wrist extension 5  10 20 15  close fist 30  Wrist ulnar deviation 10  15 18    Wrist radial deviation 5  8 12    Wrist pronation 90  90 90 90  Wrist supination 63  60 60 70  (Blank rows = not tested)  Active ROM  Right eval Left eval R 02/11/24 R 02/15/24 R 02/23/24  Thumb MCP (0-60) 50    55  Thumb IP (0-80) 50    50  Thumb Radial abd/add (0-55) 45    45   Thumb Palmar abd/add (0-45) 50    50   Thumb Opposition to Small Finger Opposition to all digits but increased pain 3rd through 5th    Opposition to all digits   Index MCP (0-90) 65   70 65 75  Index PIP (0-100) 80   90 95   Index DIP (0-70)         Long MCP (0-90)  70   75 85   Long PIP (0-100)  90   95 95   Long DIP (0-70)         Ring MCP (0-90)  70   80 90   Ring PIP (0-100)  90   95 100   Ring DIP (0-70)         Little MCP (0-90)  75   90 95   Little PIP (0-100)  90   90 100   Little DIP (0-70)         (Blank rows = not tested)  HAND FUNCTION:  02/23/24  Grip strength: Right: 8 lbs; Left: 39 lbs, Lateral pinch: Right: 1 lbs, Left: 11 lbs, and 3 point pinch: Right: 2 lbs, Left: 11 lbs if providing stability at thumb CMC had 7 lbs lat pinch and 4 3 point pinch   COORDINATION: Impaired because of severe stiffness in right hand digits and wrist   SENSATION: Patient report nerve conduction showed minimal carpal tunnel. Will further assess   EDEMA: Appeared to have some edema in the digits as well as the hand.  COGNITION: Overall cognitive status: Within functional limits for tasks assessed     TREATMENT DATE: 02/23/24    Patient arrived with reports of feeling of instability at the base of the thumb as well as over the pisiform and with wrist extension and at the elbow with supination.  Active range of motion assessed.  See flowsheets. Grip and prehension strength assessed see flowsheets.  Patient continues to show  progress.  This date focused mostly on stability over the pisiform I performing active assistive range of motion for wrist extension. Patient to keep hand with a loose fist with husband assisting or patient doing self range or active assisted range for wrist extension. Practiced different techniques in the  session. Benik neoprene provide some stability at the pisiform eye for patient to perform wrist motion.  Reviewed with patient and husband active assisted range of motion for supination.  With husband giving stability at the radius head while doing a contract relax with patient for supination over the mid radius.  10 reps tolerating well.                                                                                                                             Modalities: Done fluidotherapy today 8 minutes performing thumb palmar radial abduction, flexion of digits as well as wrist supination and flexion extension.  Patient report decreased pain able to tolerate exercises better in the Fluidotherapy   Following fluidotherapy with metacarpal sprites and soft tissue patient very guarding with thumb radial abduction. Was able to do gentle joint mope's to carpal rows as well as gentle traction in joint mobs to the carpal rows   This date patient was able to do light blue putty for gripping 12-15 reps 2 times a day.  3 seconds into the putty pain-free   Fabricated patient hand-based thumb splint for stability at middle phalanges as well as CMC -to provide stability with lateral and 3-point pinch.   Patient reports she does need more lateral support than volar and dorsal.   Patient had increased pinch with hand-based splint.   Will reassess next session if needs to change lateral support.     patient was provide in the past built-up handles to put on her utensils to initiate use of right hand for eating.  As well as brushing teeth   PATIENT EDUCATION: Education details: findings of eval and HEP  Person educated: Patient Education method: Explanation, Demonstration, Tactile cues, Verbal cues, and Handouts Education comprehension: verbalized understanding, returned demonstration, verbal cues required, and needs further education    GOALS: Goals reviewed with patient? Yes   LONG TERM  GOALS: Target date: 8 wks  Patient and husband to be independent in home program for patient to decrease resting hand pain to less than 5/10. Baseline: No knowledge about splinting or modalities and home exercise program Goal status: INITIAL  2.  Right digits  AROM improve for patient to be able to touch palm Baseline: MC flexion 65 to 75 degrees PIP flexion 80 to 90 degrees with increased pain and increase edema Goal status: INITIAL  3.  Patient pain decreased to less than 3/10 at rest for patient t at wrist to initiate PROM and AAROM  range of motion to wrist flexion extension Baseline: Wrist extension 5 degrees flexion 25.  Pain 2-9/10 at rest  Goal status: INITIAL  4.  R thumb AROM increase to [facility] for pt to do buttons, write, hold cup  Baseline: Right thumb palmar radial abduction 45 to 50 degrees with pain.  Unable to maintain position.  Wearing tape to support thumb into adduction at base.  Opposition pain to 3rd through 5th. Goal status: INITIAL  5.  Right forearm improved for patient to turn doorknobs symptoms.  Baseline: Supination 63 degrees unable to use forearm Goal status: INITIAL  6.  Right wrist active and improved for patient to perform 50% of ADLs Baseline: Wrist severely limited radial deviation5 /ulnar deviation 10; extension 5 degrees of flexion 25 with pain Goal status: INITIAL  ASSESSMENT:  CLINICAL IMPRESSION: Patient seen  for occupational therapy  for right Colles' fracture on 11/03/23.  Patient had a cast t presented in a prefab splint because a lot of pain.  Patient had  few OT sessions but progress was limited by pain.  Patient referred by Dr. Windle Hatch with severe stiffness in right dominant hand and wrist as well as pain and numbness.  Patient has history of Ehlers-Denlos syndrome-per patient nerve conduction test showed minimal carpal tunnel.  Patient present at OT evaluation with severe stiffness in wrist in all planes.  As well as pain in the thumb and  digits.  Patient reports prior to injury patient active range of motion in right wrist and hand was within functional limits did had some pain in thumb and carpal bones.  Patient hypersensitive and tender to touch as well as soft tissue mobilization.  Unable to tolerate.   NOW  patient showing improvement every session in digits and wrist range of motion.  As well as thumb.  Patient continues to be limited in pain and instability at MCP and CMC of thumb, ulnar wrist with mostly at the pisiform I in with supination at the elbow radius head.  Modified some of her home exercises.  Did fabricate her a hand-based thumb splint to give stability at the MCP and CMC.  Patient had increased pinch grip.  Will have patient use it this the next few days and see if need to modify lateral support.  Modified patient's wrist extension and supination active assisted range of motion home exercises to provide more stability and less pain.   Patient to use modalities to decrease pain, edema -and increased motion in combination with splint use and medication for pain.  Patient can benefit from skilled OT services to decrease pain and edema and increase motion and strength to return to prior level of function.  PERFORMANCE DEFICITS: in functional skills including ADLs, IADLs, ROM, strength, pain, flexibility, decreased knowledge of use of DME, and UE functional use,   and psychosocial skills including environmental adaptation and routines and behaviors.   IMPAIRMENTS: are limiting patient from ADLs, IADLs, rest and sleep, play, leisure, and social participation.   COMORBIDITIES: has no other co-morbidities that affects occupational performance. Patient will benefit from skilled OT to address above impairments and improve overall function.  MODIFICATION OR ASSISTANCE TO COMPLETE EVALUATION: No modification of tasks or assist necessary to complete an evaluation.  OT OCCUPATIONAL PROFILE AND HISTORY: Problem focused assessment:  Including review of records relating to presenting problem.  CLINICAL DECISION MAKING: LOW - limited treatment options, no task modification necessary  REHAB POTENTIAL: Good for goals  EVALUATION COMPLEXITY: Low     PLAN:  OT FREQUENCY: 1-2x/week  OT DURATION: 8 weeks  PLANNED INTERVENTIONS: 97168 OT Re-evaluation, 97535 self care/ADL training, 29562 therapeutic exercise, 97530 therapeutic activity,  97140 manual therapy, 97018 paraffin, 16109 fluidotherapy, 97034 contrast bath, 97033 iontophoresis, passive range of motion, patient/family education, and DME and/or AE instructions    CONSULTED AND AGREED WITH PLAN OF CARE: Patient     Heloise Lobo, OTR/L,CLT 02/23/2024, 1:07 PM

## 2024-02-24 NOTE — Telephone Encounter (Signed)
 Okay to send Rx as requested for diclofenac sodium 1.5% solution.  Specify unscented in the comments.  Can be written for 4 times daily as needed application. okay to send 24-month supply with 3 refills.

## 2024-02-25 ENCOUNTER — Ambulatory Visit: Admitting: Occupational Therapy

## 2024-02-25 DIAGNOSIS — M79641 Pain in right hand: Secondary | ICD-10-CM | POA: Diagnosis not present

## 2024-02-25 DIAGNOSIS — M6281 Muscle weakness (generalized): Secondary | ICD-10-CM

## 2024-02-25 DIAGNOSIS — M25631 Stiffness of right wrist, not elsewhere classified: Secondary | ICD-10-CM

## 2024-02-25 DIAGNOSIS — M25531 Pain in right wrist: Secondary | ICD-10-CM

## 2024-02-25 DIAGNOSIS — M25641 Stiffness of right hand, not elsewhere classified: Secondary | ICD-10-CM

## 2024-02-25 MED ORDER — DICLOFENAC SODIUM 1.5 % EX SOLN
CUTANEOUS | 11 refills | Status: AC
Start: 2024-02-25 — End: ?

## 2024-02-25 NOTE — Therapy (Signed)
 OUTPATIENT OCCUPATIONAL THERAPY ORTHO TREATMENT  Patient Name: Autumn Zhang MRN: [id number] DOB:05/28/1971, 53 y.o., female Today's Date: 02/25/2024  PCP: Dr David Escort REFERRING PROVIDER: Dr Windle Hatch  END OF SESSION:  OT End of Session - 02/25/24 1228     Visit Number 5    Number of Visits 14    Date for OT Re-Evaluation 03/29/24    OT Start Time 1033    OT Stop Time 1119    OT Time Calculation (min) 46 min    Activity Tolerance Patient tolerated treatment well;Patient limited by pain    Behavior During Therapy [facility] for tasks assessed/performed          Past Medical History:  Diagnosis Date   Allergy    Anemia    Anxiety    Arthritis    Asthma    Attention deficit hyperactivity disorder (ADHD), combined type 10/18/2015   Diagnosed many years ago.  Has been doing well on Ritalin  5mg  QID to six times a day. Long acting hasn't worked well in the past and she has not wanted to try Adderall. She is taking wellbutrin as well, but she has been weaning medication due to SE migraine.  She is now down to Wellbutrin one quarter tablet daily.  Last Assessment & Plan:  A/P: Stable.  Continue current dosing.  Refilled 3 months   Basal cell carcinoma 05/16/2021   Superficial, R post lower leg. [facility] [place], [place]   Basal cell carcinoma 09/03/2006   L chest, R forearm. 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   Dysautonomia (HCC)    Dysplastic nevus 05/28/2009   right lateral back, [facility] [place], [place]   Dysplastic nevus 01/13/2023   left lower calf, mod to severe, shave removal 03/16/23, margins free 03/16/23   Ehlers-Danlos syndrome type III    Family history of colon cancer    GERD (gastroesophageal reflux disease)    Hyperlipidemia    Idiopathic small fiber peripheral  neuropathy    Leiomyoma    Mast cell activation (HCC)    Migraines    Neck pain    Occipital neuralgia    Sleep apnea    Past Surgical History:  Procedure Laterality Date   APPENDECTOMY     removed with colectomy   BREAST SURGERY     reduction   COLON SURGERY  2008   for colon cancer, ~1/3 of colon removed, primary reanastimosis   COLONOSCOPY WITH PROPOFOL  N/A 10/07/2018   Procedure: COLONOSCOPY WITH PROPOFOL ;  Surgeon: Selena Daily, MD;  Location: [facility] ENDOSCOPY;  Service: Gastroenterology;  Laterality: N/A;   COLONOSCOPY WITH PROPOFOL  N/A 08/14/2021   Procedure: COLONOSCOPY WITH PROPOFOL ;  Surgeon: Selena Daily, MD;  Location: [facility] ENDOSCOPY;  Service: Gastroenterology;  Laterality: N/A;   ENDOMETRIAL ABLATION     trigeminal stimulator     Patient Active Problem List   Diagnosis Date Noted   Psoriatic arthritis (HCC) 12/21/2023   MTHFR mutation 12/21/2023   Lymphedema 04/28/2023   Facial swelling 04/13/2023   Liver cyst 06/24/2022   Decreased appetite 06/24/2022   Generalized abdominal pain 06/24/2022   Abnormal weight gain 02/03/2022   Central obesity 02/03/2022   Impaired fasting glucose 02/03/2022   Cervical spondylosis with radiculopathy 10/09/2021   Psoriasis 10/08/2021   Exocrine pancreatic  insufficiency 09/26/2021   Urinary frequency 09/26/2021   Atypical facial pain (1ry area of Pain) (Left) 08/29/2021   Chronic ear pain (Left) 08/29/2021   Temporal headache (Left) 08/29/2021   Inner ear pain (Left) 08/29/2021   Trigeminal nerve disorder (Left) 08/29/2021   History of photophobia 08/29/2021   TMJ (temporomandibular joint syndrome) 08/29/2021   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   Polypharmacy 08/29/2021   Severe frontal headaches 08/29/2021   Chronic occipital headache (Midline) (2ry area of Pain) 08/29/2021   Chronic neck pain (posterior) (4th area of Pain) (Bilateral) (L>R) 08/29/2021   Musculoskeletal disorder involving upper  trapezius muscle 08/29/2021   Musculoskeletal disorder involving sternocleidomastoid (Left) 08/29/2021   Abnormal MRI, cervical spine (02/11/2019) 08/29/2021   Shoulder stiffness (Right) 08/29/2021   Pharmacologic therapy 08/27/2021   Disorder of skeletal system 08/27/2021   Problems influencing health status 08/27/2021   GAD (generalized anxiety disorder) 07/17/2021   Difficulty walking 07/17/2021   Headache disorder (3ry area of Pain) 07/17/2021   Photophobia 07/17/2021   Difficulty with speech 07/17/2021   Tear film insufficiency 07/15/2021   Left lower quadrant abdominal pain 07/05/2021   Palpitations 06/11/2021   Axillary lymphadenopathy 06/11/2021   Immunosuppressed status (HCC) 04/19/2021   History of seronegative inflammatory arthritis 06/28/2020   Osteopenia of multiple sites 06/14/2020   Polyarthralgia 06/12/2020   Iodine deficiency 06/12/2020   OSA on CPAP 06/12/2020   Chronic pain syndrome 06/12/2020   Psychophysiological insomnia 11/26/2019   Calcific tendinitis of shoulder (Right) 04/10/2019   History of iron  deficiency anemia 11/04/2018   History of basal cell carcinoma 10/07/2018   Vaginal dryness 09/07/2018   Chronic jaw pain (Left) 07/15/2018   Chronic migraine with aura 07/15/2018   Ehlers-Danlos syndrome, type 3 07/15/2018   Rectocele 05/21/2018   Ehlers-Danlos syndrome 03/15/2018   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   Cardiac arrhythmia 01/21/2018   Polyp of colon 01/21/2018   Chronic constipation 01/21/2018   Instability of shoulder joint (Right) 01/12/2018   Trigeminal neuralgia (V1-3) (Left) 12/25/2017   Instability of shoulder joint (Left) 12/25/2017   Nausea 12/25/2017   Occipital neuralgia 12/25/2017   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   Hiatal hernia 10/14/2017   TMJ (dislocation of temporomandibular joint) 09/29/2017   Irritable bowel syndrome 07/02/2017   Anal fissure 07/02/2017   Anemia  07/02/2017   Gastroesophageal reflux disease 07/02/2017   Hemorrhoids 07/02/2017   Malignant neoplasm of skin 07/02/2017   Malignant tumor of colon (HCC) 07/02/2017   Arthritis 07/02/2017   Fibromyalgia 07/02/2017   Pulsatile tinnitus of ear (Left) 12/12/2016   Restless leg syndrome 08/01/2016   Cervical dystonia 03/21/2016   Cervical spine disease 03/21/2016   Cervicalgia 02/06/2016   Paresthesia 02/06/2016   Hypercholesterolemia 12/24/2015   Iron  deficiency anemia 10/18/2015   Vitamin D  deficiency disease 10/18/2015   Midline low back pain without sciatica 02/06/2015   Hyperglycemia 10/30/2014   History of colon cancer 09/27/2014   Postural orthostatic tachycardia syndrome 09/16/2003   Chronic fatigue 09/15/2000   Asthma 09/15/1988   Dyspnea 09/15/1988    ONSET DATE: 11/03/23  REFERRING DIAG: R wrist fx with numbness and pain   THERAPY DIAG:  Pain in right hand  Pain in right wrist  Stiffness of right hand, not elsewhere classified  Stiffness of right wrist, not elsewhere classified  Muscle weakness (generalized)  Rationale for Evaluation and Treatment: Rehabilitation  SUBJECTIVE:   SUBJECTIVE STATEMENT: I was really hurting after last time.  We  were working on the rotation and extension.  In the tenderness at the pisiform I to try and stabilize that.  I feel like the blue neoprene is working somewhat.  But the finger-thumb splint is not giving me the right support.  I feel like I needed at the outside because then I can bend my thumb.  Pt accompanied by: self  PERTINENT HISTORY: 01/13/24 ortho note Dr Windle Hatch  I personally reviewed and visualized the imaging studies if available.  - EMG/NCS RUE = evaluate for any acute nerve injury from her distal radius fracture causing her reported symptoms  Assessment:   ICD-10-CM  1. Closed Colles' fracture of right radius, initial encounter S52.531A  2. Stiffness of right wrist joint M25.631  3. Numbness and tingling  in right hand R20.0  R20.2   Plan:   I have discussed the nature of her current subjective complaints, clinical examination, test results and have reviewed treatment options. The plan is to do the following;  - The patient has ongoing right wrist pain, swelling, decreased motion, hand numbness and tingling, difficulty with hand function after a distal radius fracture on 11/03/2023. She seems to have been treated appropriately with splint followed by cast followed by brace then referred to occupational therapy. Some of her symptoms could simply be due to ordinary sequela I of injury and recovery has joint stiffness could be prolonged for up to 6-12 months. She could be developing carpal tunnel syndrome which can occur with a distal radius fracture as well. I am unsure about her ulnar sided fingers issue? This could be some nerve injury versus biomechanical abnormality because of significant loss to wrist motion versus developing complex regional pain syndrome. - She has had recent x-rays that show healing to her nondisplaced, extra-articular distal radius fracture. There is some osteopenia surrounding the joint which could be from disuse/immobilization. She has significant decreased her wrist motion. Pain limits a lot of examination. I discussed the normal recovery after distal radius fracture and how some of the things she is feeling can occur. Because of the complexity of her past medical history and her reported issues that seem neurologic, I recommended EMG ASAP to evaluate for any nerve damage. I also referred her to a different occupational therapist to see if they can provide any different treatments. Depending on the EMG results, she may need referral to neurosurgery versus hand surgery specialist versus general orthopedic surgeon. - Daily activity as tolerated. Modify activity as needed according to symptoms. No limitations to pain-free weight bearing. No need for immobilization or bracing. - Refer  to OT for pain relieving modalities, manual techniques as indicated, home exercise planning. Continue home exercise program to maintain strength, flexibility, and endurance. - Use chronic medications, over-the-counter medicines, topical diclofenac/pain cream, relative rest, compression, massage, and ice/heat as needed for pain.  - Follow up depending on EMG results. I did discuss that we could consider carpal tunnel steroid injection as a possible treatment.   PRECAUTIONS: none     WEIGHT BEARING RESTRICTIONS: No  PAIN:  Are you having pain? 3/10 radial wrist , pisiforme and base of thumb   FALLS: Has patient fallen in last 6 months? ?  LIVING ENVIRONMENT: Lives with: lives with their spouse    PLOF: on disability -patient occupation was International aid/development worker.  Because of her medical history she does not participate in cooking and laundry and cleaning.  But is active around the house.  Likes to walk.  On the computer and phone. Patient did  prior to injury had pain at the thumb.  And at the carpal bones at times.  Appear active range of motion was within functional limits   PATIENT GOALS: I want the pain better my right hand and wrist and get my motion and strength back so I can use my hand  NEXT MD VISIT: ?  OBJECTIVE:  Note: Objective measures were completed at Evaluation unless otherwise noted.  HAND DOMINANCE: Right  ADLs: Severe difficulty using right hand. Unable to grip, pull or push or hold or carry.  Limited by severe wrist stiffness.   FUNCTIONAL OUTCOME MEASURES: Next session  UPPER EXTREMITY ROM:     Active ROM Right eval Left eval R 02/11/24 R 02/15/24 R 02/23/24  Shoulder flexion       Shoulder abduction       Shoulder adduction       Shoulder extension       Shoulder internal rotation       Shoulder external rotation       Elbow flexion       Elbow extension       Wrist flexion 25  35 40 50  Wrist extension 5  10 20 15  close fist 30  Wrist ulnar deviation 10   15 18    Wrist radial deviation 5  8 12    Wrist pronation 90  90 90 90  Wrist supination 63  60 60 70  (Blank rows = not tested)  Active ROM Right eval Left eval R 02/11/24 R 02/15/24 R 02/23/24  Thumb MCP (0-60) 50    55  Thumb IP (0-80) 50    50  Thumb Radial abd/add (0-55) 45    45   Thumb Palmar abd/add (0-45) 50    50   Thumb Opposition to Small Finger Opposition to all digits but increased pain 3rd through 5th    Opposition to all digits   Index MCP (0-90) 65   70 65 75  Index PIP (0-100) 80   90 95   Index DIP (0-70)         Long MCP (0-90)  70   75 85   Long PIP (0-100)  90   95 95   Long DIP (0-70)         Ring MCP (0-90)  70   80 90   Ring PIP (0-100)  90   95 100   Ring DIP (0-70)         Little MCP (0-90)  75   90 95   Little PIP (0-100)  90   90 100   Little DIP (0-70)         (Blank rows = not tested)  HAND FUNCTION:  02/23/24  Grip strength: Right: 8 lbs; Left: 39 lbs, Lateral pinch: Right: 1 lbs, Left: 11 lbs, and 3 point pinch: Right: 2 lbs, Left: 11 lbs if providing stability at thumb CMC had 7 lbs lat pinch and 4 3 point pinch   COORDINATION: Impaired because of severe stiffness in right hand digits and wrist   SENSATION: Patient report nerve conduction showed minimal carpal tunnel. Will further assess   EDEMA: Appeared to have some edema in the digits as well as the hand.  COGNITION: Overall cognitive status: Within functional limits for tasks assessed     TREATMENT DATE: 02/25/24    Patient arrived with reports of continued to feel instability at the base of the thumb as well as pain at the pisiform with  wrist extension and then with supination she feels also just pain  Fabricated patient  last visit hand-based thumb splint for stability at middle phalanges as well as CMC -to provide stability with lateral and 3-point pinch.   Patient reports she needs more stability and support at the radial side of the MCP and middle phalanges. Upon assessment  and looking at flexibility mobility. Change splint to smaller radial support splint from thumb IP to lateral CMC with Velcro through the webspace at the middle phalanges and at the base of the splint around the wrist for stability. Patient report and show increased stability and less pain with radial and palmar abduction as well as lateral pinch and opposition to all digits to second fold of fifth.  Reviewed with patient supination active assisted range.  Patient can provide stability at the radius head while husband assisting with a contract relax at endrange 10 reps Patient to focus on forearm motion not wrist.  That will cause a pull at the pisiform. Wrist extension patient to wear a Benik neoprene for stability and support of the pisiform I while performing active assisted range with a closed fist on the washcloth or towel performing wrist extension stretch 10 reps Feeling the pull over the volar mid wrist.   .                                                                                                                             Modalities: Done fluidotherapy today 8 minutes performing thumb palmar radial abduction, flexion of digits as well as wrist supination and flexion extension.  Patient report decreased pain able to tolerate exercises better in the Fluidotherapy  During and after fluidotherapy reviewed with patient's home program in order.  Patient needed mod verbal cueing for sequence and correct performance Patient to work on soft tissue massage and mobs in the thumb webspace, rolling over the right roller as well as the light blue putty rolling over the carpal tunnel and arches Followed by tendon glides light blue putty for gripping 12-15 reps 2 times a day.  3 seconds into the putty pain-free  With the hand-based splint patient can perform a light lateral and 3-point pinch into pink foam pain-free 12 reps  Followed by her radial ulnar deviation active assisted range of  motion Wrist flexion stretch over the edge of the table active assisted range 10 reps And then the above supination and wrist extension stretch.     patient was provide in the past built-up handles to put on her utensils to initiate use of right hand for eating.  As well as brushing teeth   PATIENT EDUCATION: Education details: findings of eval and HEP  Person educated: Patient Education method: Explanation, Demonstration, Tactile cues, Verbal cues, and Handouts Education comprehension: verbalized understanding, returned demonstration, verbal cues required, and needs further education    GOALS: Goals reviewed with patient? Yes   LONG TERM GOALS: Target date: 8 wks  Patient and husband to be independent in home program for patient to decrease resting hand pain to less than 5/10. Baseline: No knowledge about splinting or modalities and home exercise program Goal status: INITIAL  2.  Right digits  AROM improve for patient to be able to touch palm Baseline: MC flexion 65 to 75 degrees PIP flexion 80 to 90 degrees with increased pain and increase edema Goal status: INITIAL  3.  Patient pain decreased to less than 3/10 at rest for patient t at wrist to initiate PROM and AAROM  range of motion to wrist flexion extension Baseline: Wrist extension 5 degrees flexion 25.  Pain 2-9/10 at rest  Goal status: INITIAL  4.  R thumb AROM increase to [facility] for pt to do buttons, write, hold cup  Baseline: Right thumb palmar radial abduction 45 to 50 degrees with pain.  Unable to maintain position.  Wearing tape to support thumb into adduction at base.  Opposition pain to 3rd through 5th. Goal status: INITIAL  5.  Right forearm improved for patient to turn doorknobs symptoms.  Baseline: Supination 63 degrees unable to use forearm Goal status: INITIAL  6.  Right wrist active and improved for patient to perform 50% of ADLs Baseline: Wrist severely limited radial deviation5 /ulnar deviation 10;  extension 5 degrees of flexion 25 with pain Goal status: INITIAL  ASSESSMENT:  CLINICAL IMPRESSION: Patient seen  for occupational therapy  for right Colles' fracture on 11/03/23.  Patient had a cast t presented in a prefab splint because a lot of pain.  Patient had  few OT sessions but progress was limited by pain.  Patient referred by Dr. Windle Hatch with severe stiffness in right dominant hand and wrist as well as pain and numbness.  Patient has history of Ehlers-Denlos syndrome-per patient nerve conduction test showed minimal carpal tunnel.  Patient present at OT evaluation with severe stiffness in wrist in all planes.  As well as pain in the thumb and digits.  Patient reports prior to injury patient active range of motion in right wrist and hand was within functional limits did had some pain in thumb and carpal bones.  Patient hypersensitive and tender to touch as well as soft tissue mobilization.  Unable to tolerate.   NOW   Patient continues to be limited in pain and instability CMC and MCP of right thumb, ulnar wrist with mostly at the pisiform.  And with supination at the elbow radius head.  Modified couple of her home exercises-supination and wrist extension to provide more stability at the radius head and the pisiform eye.  Patient needed review again.  And to focus on contract relax and forearm motion with supination..  Modified her hand-based thumb splint to give stability the state more at the radial right thumb metacarpal and middle phalanges with a strap through the webspace at middle phalanges and around the wrist.  Patient had less pain and more stability with palmar radial abduction as well as opposition and lateral pinch.  Was able to adduct lateral and 3 point pinch and pink foam for home exercises.  Patient is showing great progress would like to focus on strengthening proximally as well as patient to tolerate more manual and soft tissue to the wrist and carpals.  With facilitation of more  flexibility at the wrist and forearm.   Patient to use modalities to decrease pain, edema -and increased motion in combination with splint use and medication for pain.  Patient can benefit from skilled OT  services to decrease pain and edema and increase motion and strength to return to prior level of function.  PERFORMANCE DEFICITS: in functional skills including ADLs, IADLs, ROM, strength, pain, flexibility, decreased knowledge of use of DME, and UE functional use,   and psychosocial skills including environmental adaptation and routines and behaviors.   IMPAIRMENTS: are limiting patient from ADLs, IADLs, rest and sleep, play, leisure, and social participation.   COMORBIDITIES: has no other co-morbidities that affects occupational performance. Patient will benefit from skilled OT to address above impairments and improve overall function.  MODIFICATION OR ASSISTANCE TO COMPLETE EVALUATION: No modification of tasks or assist necessary to complete an evaluation.  OT OCCUPATIONAL PROFILE AND HISTORY: Problem focused assessment: Including review of records relating to presenting problem.  CLINICAL DECISION MAKING: LOW - limited treatment options, no task modification necessary  REHAB POTENTIAL: Good for goals  EVALUATION COMPLEXITY: Low     PLAN:  OT FREQUENCY: 1-2x/week  OT DURATION: 8 weeks  PLANNED INTERVENTIONS: 97168 OT Re-evaluation, 97535 self care/ADL training, 44010 therapeutic exercise, 97530 therapeutic activity, 97140 manual therapy, 97018 paraffin, 27253 fluidotherapy, 97034 contrast bath, 97033 iontophoresis, passive range of motion, patient/family education, and DME and/or AE instructions    CONSULTED AND AGREED WITH PLAN OF CARE: Patient     Heloise Lobo, OTR/L,CLT 02/25/2024, 12:30 PM

## 2024-02-26 ENCOUNTER — Encounter: Payer: Self-pay | Admitting: Podiatry

## 2024-02-26 ENCOUNTER — Ambulatory Visit (INDEPENDENT_AMBULATORY_CARE_PROVIDER_SITE_OTHER): Admitting: Podiatry

## 2024-02-26 VITALS — Ht 62.0 in | Wt 141.8 lb

## 2024-02-26 DIAGNOSIS — L600 Ingrowing nail: Secondary | ICD-10-CM | POA: Diagnosis not present

## 2024-02-26 MED ORDER — CICLOPIROX 8 % EX SOLN: Freq: Every day | CUTANEOUS | 2 refills | Status: DC

## 2024-02-26 MED ORDER — CLOTRIMAZOLE-BETAMETHASONE 1-0.05 % EX CREA: 1.0000 | TOPICAL_CREAM | Freq: Every day | CUTANEOUS | 2 refills | Status: DC

## 2024-02-26 NOTE — Progress Notes (Signed)
 Chief Complaint  Patient presents with   Ingrown Toenail    Pt is here due to having a ingrown on right great toenail and will like to have it removed, also states she has also had trouble with her toenails and would like to know ho to properly cut them, last believes she has an toenail fungus she has been treating it with a cream that has help a little.    Subjective: Patient presents today for evaluation of pain to the medial border right great toe. Patient is concerned for possible ingrown nail.  It is very sensitive to touch.   Patient also concerned for possible toenail fungus.  She has noticed some thickening to the nail especially the right hallux.  Patient presents today for further treatment and evaluation.  Past Medical History:  Diagnosis Date   Allergy    Anemia    Anxiety    Arthritis    Asthma    Attention deficit hyperactivity disorder (ADHD), combined type 10/18/2015   Diagnosed many years ago.  Has been doing well on Ritalin  5mg  QID to six times a day. Long acting hasn't worked well in the past and she has not wanted to try Adderall. She is taking wellbutrin as well, but she has been weaning medication due to SE migraine.  She is now down to Wellbutrin one quarter tablet daily.  Last Assessment & Plan:  A/P: Stable.  Continue current dosing.  Refilled 3 months   Basal cell carcinoma 05/16/2021   Superficial, R post lower leg. [facility] [place], [place]   Basal cell carcinoma 09/03/2006   L chest, R forearm. 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   Dysautonomia (HCC)    Dysplastic nevus 05/28/2009   right lateral back, [facility] [place], [place]   Dysplastic nevus 01/13/2023   left lower calf, mod to severe, shave removal 03/16/23, margins free 03/16/23   Ehlers-Danlos  syndrome type III    Family history of colon cancer    GERD (gastroesophageal reflux disease)    Hyperlipidemia    Idiopathic small fiber peripheral neuropathy    Leiomyoma    Mast cell activation (HCC)    Migraines    Neck pain    Occipital neuralgia    Sleep apnea     Past Surgical History:  Procedure Laterality Date   APPENDECTOMY     removed with colectomy   BREAST SURGERY     reduction   COLON SURGERY  2008   for colon cancer, ~1/3 of colon removed, primary reanastimosis   COLONOSCOPY WITH PROPOFOL  N/A 10/07/2018   Procedure: COLONOSCOPY WITH PROPOFOL ;  Surgeon: Selena Daily, MD;  Location: [facility] ENDOSCOPY;  Service: Gastroenterology;  Laterality: N/A;   COLONOSCOPY WITH PROPOFOL  N/A 08/14/2021   Procedure: COLONOSCOPY WITH PROPOFOL ;  Surgeon: Selena Daily, MD;  Location: [facility];  Service: Gastroenterology;  Laterality: N/A;   ENDOMETRIAL ABLATION     trigeminal stimulator      Allergies  Allergen Reactions   Iodinated Contrast Media Hives, Itching, Palpitations, Photosensitivity and Shortness Of Breath    Other reaction(s): Headache Other reaction(s): Headache   Ioversol Anxiety, Hives, Itching and Palpitations   Ciprofloxacin Other (See Comments)   Levofloxacin Other (See Comments)    Congestion & Headaches Congestion &  Headaches    Other Other (See Comments)    Novocaine (IV) form Hearing loss Novocaine (IV) form Hearing loss  Congestion & Headaches Congestion & Headaches Congestion & Headaches Congestion & Headaches  Other reaction(s): Unknown   Soy Allergy (Obsolete)     Other reaction(s): Other (See Comments), Unknown Congestion & Headaches Congestion & Headaches   Soybean-Containing Drug Products Other (See Comments)    Congestion & Headaches    Bee Pollen    Bupropion    Celebrex  [Celecoxib ]    Codeine    Dexilant [Dexlansoprazole]    Doxepin     Other Reaction(s): Unknown   Dulera [Mometasone Furo-Formoterol Fum]     Fentanyl    Fluticasone     Inhaled into lungs it does not do well   Gabapentin    Gluten Meal    Histidine    Lidocaine      Rash to topical cream Flushing and redness   Lyrica [Pregabalin]    Mold Extract [Trichophyton]    Morphine And Codeine    Penicillins     Skin testing positive for allergy   Pollen Extract    Polysorbate [Sorbitan]    Prilosec [Omeprazole ]    Rizatriptan  Other (See Comments)    Dizziness, headache, myalgias, nausea   Salmeterol Xinafoate    Silenor [Doxepin Hcl]    Stevioside Other (See Comments)   Tomato    Xylitol Other (See Comments)   Brewer's Dried Yeast [Yeast] Other (See Comments)    Headache, flushing   Ethanol Dermatitis, Other (See Comments), Itching and Palpitations   Hydrocodone Nausea Only   Ibuprofen Nausea And Vomiting    At high doses At high doses    Lanolin Other (See Comments)    Exacerbates neuralgia symptoms Wool causes hives.    Morphine Nausea Only   Wheat Diarrhea   Yeast-Derived Drug Products Other (See Comments)    Headache, flushing    Objective:  General: Well developed, nourished, in no acute distress, alert and oriented x3   Dermatology: Skin is warm, dry and supple bilateral.  Medial border right great toe is tender with evidence of an ingrowing nail. Pain on palpation noted to the border of the nail fold.  Slight hyperkeratotic nail noted to the distal tip of the right hallux nail plate as well as the lesser digits to a lesser extent.  Consistent with findings of mild onychomycosis of the toenails  Vascular: DP and PT pulses palpable.  No clinical evidence of vascular compromise  Neruologic: Grossly intact via light touch bilateral.  Musculoskeletal: No pedal deformity noted  Assesement: #1 Paronychia with ingrowing nail medial border right great toe #2 chronic intermittent tinea pedis bilateral #3 onychomycosis of toenails bilateral  Plan of Care:  -Patient evaluated.  -Discussed treatment  alternatives and plan of care. Explained nail avulsion procedure and post procedure course to patient. -Patient opted for permanent partial nail avulsion of the ingrown portion of the nail.  -Prior to procedure, local anesthesia infiltration utilized using 3 ml of a 50:50 mixture of 2% plain lidocaine  and 0.5% plain marcaine in a normal hallux block fashion and a betadine prep performed.  -Partial permanent nail avulsion with chemical matrixectomy performed using 3x30sec applications of phenol followed by alcohol flush.  -Light dressing applied.  Post care instructions provided -Today we discussed pathology and etiology of fungal nail infection as well as tinea pedis to the foot. -Prescription for Penlac 8% topical solution -Prescription for Lotrisone cream use PRN -Return to clinic  3 weeks  Dot Gazella, DPM [facility]  Dr. Dot Gazella, DPM    [address] [place], [place] [address]                Office [phone]  Fax [phone]

## 2024-02-26 NOTE — Patient Instructions (Signed)

## 2024-02-29 ENCOUNTER — Ambulatory Visit: Admitting: Occupational Therapy

## 2024-02-29 DIAGNOSIS — M6281 Muscle weakness (generalized): Secondary | ICD-10-CM

## 2024-02-29 DIAGNOSIS — M79641 Pain in right hand: Secondary | ICD-10-CM | POA: Diagnosis not present

## 2024-02-29 DIAGNOSIS — M25531 Pain in right wrist: Secondary | ICD-10-CM

## 2024-02-29 DIAGNOSIS — M25631 Stiffness of right wrist, not elsewhere classified: Secondary | ICD-10-CM

## 2024-02-29 DIAGNOSIS — M25641 Stiffness of right hand, not elsewhere classified: Secondary | ICD-10-CM

## 2024-02-29 NOTE — Therapy (Signed)
 OUTPATIENT OCCUPATIONAL THERAPY ORTHO TREATMENT  Patient Name: Autumn Zhang MRN: [id number] DOB:1970/10/11, 53 y.o., female Today's Date: 02/29/2024  PCP: Dr David Escort REFERRING PROVIDER: Dr Windle Hatch  END OF SESSION:  OT End of Session - 02/29/24 1035     Visit Number 6    Number of Visits 14    Date for OT Re-Evaluation 03/29/24    OT Start Time 1036    OT Stop Time 1130    OT Time Calculation (min) 54 min    Activity Tolerance Patient tolerated treatment well;Patient limited by pain    Behavior During Therapy [facility] for tasks assessed/performed          Past Medical History:  Diagnosis Date   Allergy    Anemia    Anxiety    Arthritis    Asthma    Attention deficit hyperactivity disorder (ADHD), combined type 10/18/2015   Diagnosed many years ago.  Has been doing well on Ritalin  5mg  QID to six times a day. Long acting hasn't worked well in the past and she has not wanted to try Adderall. She is taking wellbutrin as well, but she has been weaning medication due to SE migraine.  She is now down to Wellbutrin one quarter tablet daily.  Last Assessment & Plan:  A/P: Stable.  Continue current dosing.  Refilled 3 months   Basal cell carcinoma 05/16/2021   Superficial, R post lower leg. [facility] [place], [place]   Basal cell carcinoma 09/03/2006   L chest, R forearm. 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   Dysautonomia (HCC)    Dysplastic nevus 05/28/2009   right lateral back, [facility] [place], [place]   Dysplastic nevus 01/13/2023   left lower calf, mod to severe, shave removal 03/16/23, margins free 03/16/23   Ehlers-Danlos syndrome type III    Family history of colon cancer    GERD (gastroesophageal reflux disease)    Hyperlipidemia    Idiopathic small fiber peripheral  neuropathy    Leiomyoma    Mast cell activation (HCC)    Migraines    Neck pain    Occipital neuralgia    Sleep apnea    Past Surgical History:  Procedure Laterality Date   APPENDECTOMY     removed with colectomy   BREAST SURGERY     reduction   COLON SURGERY  2008   for colon cancer, ~1/3 of colon removed, primary reanastimosis   COLONOSCOPY WITH PROPOFOL  N/A 10/07/2018   Procedure: COLONOSCOPY WITH PROPOFOL ;  Surgeon: Selena Daily, MD;  Location: [facility] ENDOSCOPY;  Service: Gastroenterology;  Laterality: N/A;   COLONOSCOPY WITH PROPOFOL  N/A 08/14/2021   Procedure: COLONOSCOPY WITH PROPOFOL ;  Surgeon: Selena Daily, MD;  Location: [facility] ENDOSCOPY;  Service: Gastroenterology;  Laterality: N/A;   ENDOMETRIAL ABLATION     trigeminal stimulator     Patient Active Problem List   Diagnosis Date Noted   Psoriatic arthritis (HCC) 12/21/2023   MTHFR mutation 12/21/2023   Lymphedema 04/28/2023   Facial swelling 04/13/2023   Liver cyst 06/24/2022   Decreased appetite 06/24/2022   Generalized abdominal pain 06/24/2022   Abnormal weight gain 02/03/2022   Central obesity 02/03/2022   Impaired fasting glucose 02/03/2022   Cervical spondylosis with radiculopathy 10/09/2021   Psoriasis 10/08/2021   Exocrine pancreatic  insufficiency 09/26/2021   Urinary frequency 09/26/2021   Atypical facial pain (1ry area of Pain) (Left) 08/29/2021   Chronic ear pain (Left) 08/29/2021   Temporal headache (Left) 08/29/2021   Inner ear pain (Left) 08/29/2021   Trigeminal nerve disorder (Left) 08/29/2021   History of photophobia 08/29/2021   TMJ (temporomandibular joint syndrome) 08/29/2021   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   Polypharmacy 08/29/2021   Severe frontal headaches 08/29/2021   Chronic occipital headache (Midline) (2ry area of Pain) 08/29/2021   Chronic neck pain (posterior) (4th area of Pain) (Bilateral) (L>R) 08/29/2021   Musculoskeletal disorder involving upper  trapezius muscle 08/29/2021   Musculoskeletal disorder involving sternocleidomastoid (Left) 08/29/2021   Abnormal MRI, cervical spine (02/11/2019) 08/29/2021   Shoulder stiffness (Right) 08/29/2021   Pharmacologic therapy 08/27/2021   Disorder of skeletal system 08/27/2021   Problems influencing health status 08/27/2021   GAD (generalized anxiety disorder) 07/17/2021   Difficulty walking 07/17/2021   Headache disorder (3ry area of Pain) 07/17/2021   Photophobia 07/17/2021   Difficulty with speech 07/17/2021   Tear film insufficiency 07/15/2021   Left lower quadrant abdominal pain 07/05/2021   Palpitations 06/11/2021   Axillary lymphadenopathy 06/11/2021   Immunosuppressed status (HCC) 04/19/2021   History of seronegative inflammatory arthritis 06/28/2020   Osteopenia of multiple sites 06/14/2020   Polyarthralgia 06/12/2020   Iodine deficiency 06/12/2020   OSA on CPAP 06/12/2020   Chronic pain syndrome 06/12/2020   Psychophysiological insomnia 11/26/2019   Calcific tendinitis of shoulder (Right) 04/10/2019   History of iron  deficiency anemia 11/04/2018   History of basal cell carcinoma 10/07/2018   Vaginal dryness 09/07/2018   Chronic jaw pain (Left) 07/15/2018   Chronic migraine with aura 07/15/2018   Ehlers-Danlos syndrome, type 3 07/15/2018   Rectocele 05/21/2018   Ehlers-Danlos syndrome 03/15/2018   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   Cardiac arrhythmia 01/21/2018   Polyp of colon 01/21/2018   Chronic constipation 01/21/2018   Instability of shoulder joint (Right) 01/12/2018   Trigeminal neuralgia (V1-3) (Left) 12/25/2017   Instability of shoulder joint (Left) 12/25/2017   Nausea 12/25/2017   Occipital neuralgia 12/25/2017   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   Hiatal hernia 10/14/2017   TMJ (dislocation of temporomandibular joint) 09/29/2017   Irritable bowel syndrome 07/02/2017   Anal fissure 07/02/2017   Anemia  07/02/2017   Gastroesophageal reflux disease 07/02/2017   Hemorrhoids 07/02/2017   Malignant neoplasm of skin 07/02/2017   Malignant tumor of colon (HCC) 07/02/2017   Arthritis 07/02/2017   Fibromyalgia 07/02/2017   Pulsatile tinnitus of ear (Left) 12/12/2016   Restless leg syndrome 08/01/2016   Cervical dystonia 03/21/2016   Cervical spine disease 03/21/2016   Cervicalgia 02/06/2016   Paresthesia 02/06/2016   Hypercholesterolemia 12/24/2015   Iron  deficiency anemia 10/18/2015   Vitamin D  deficiency disease 10/18/2015   Midline low back pain without sciatica 02/06/2015   Hyperglycemia 10/30/2014   History of colon cancer 09/27/2014   Postural orthostatic tachycardia syndrome 09/16/2003   Chronic fatigue 09/15/2000   Asthma 09/15/1988   Dyspnea 09/15/1988    ONSET DATE: 11/03/23  REFERRING DIAG: R wrist fx with numbness and pain   THERAPY DIAG:  Pain in right hand  Pain in right wrist  Stiffness of right hand, not elsewhere classified  Stiffness of right wrist, not elsewhere classified  Muscle weakness (generalized)  Rationale for Evaluation and Treatment: Rehabilitation  SUBJECTIVE:   SUBJECTIVE STATEMENT: It did help thank you put the exercises in  order for me to be done at home..  The left thumb splint helps but feels like it makes that I cannot bend my thumb.  My rotation is getting , better my thumb is getting better ,fingers is better.  But my pisiform area just stays swollen pt accompanied by: self  PERTINENT HISTORY: 01/13/24 ortho note Dr Windle Hatch  I personally reviewed and visualized the imaging studies if available.  - EMG/NCS RUE = evaluate for any acute nerve injury from her distal radius fracture causing her reported symptoms  Assessment:   ICD-10-CM  1. Closed Colles' fracture of right radius, initial encounter S52.531A  2. Stiffness of right wrist joint M25.631  3. Numbness and tingling in right hand R20.0  R20.2   Plan:   I have discussed  the nature of her current subjective complaints, clinical examination, test results and have reviewed treatment options. The plan is to do the following;  - The patient has ongoing right wrist pain, swelling, decreased motion, hand numbness and tingling, difficulty with hand function after a distal radius fracture on 11/03/2023. She seems to have been treated appropriately with splint followed by cast followed by brace then referred to occupational therapy. Some of her symptoms could simply be due to ordinary sequela I of injury and recovery has joint stiffness could be prolonged for up to 6-12 months. She could be developing carpal tunnel syndrome which can occur with a distal radius fracture as well. I am unsure about her ulnar sided fingers issue? This could be some nerve injury versus biomechanical abnormality because of significant loss to wrist motion versus developing complex regional pain syndrome. - She has had recent x-rays that show healing to her nondisplaced, extra-articular distal radius fracture. There is some osteopenia surrounding the joint which could be from disuse/immobilization. She has significant decreased her wrist motion. Pain limits a lot of examination. I discussed the normal recovery after distal radius fracture and how some of the things she is feeling can occur. Because of the complexity of her past medical history and her reported issues that seem neurologic, I recommended EMG ASAP to evaluate for any nerve damage. I also referred her to a different occupational therapist to see if they can provide any different treatments. Depending on the EMG results, she may need referral to neurosurgery versus hand surgery specialist versus general orthopedic surgeon. - Daily activity as tolerated. Modify activity as needed according to symptoms. No limitations to pain-free weight bearing. No need for immobilization or bracing. - Refer to OT for pain relieving modalities, manual techniques as  indicated, home exercise planning. Continue home exercise program to maintain strength, flexibility, and endurance. - Use chronic medications, over-the-counter medicines, topical diclofenac /pain cream, relative rest, compression, massage, and ice/heat as needed for pain.  - Follow up depending on EMG results. I did discuss that we could consider carpal tunnel steroid injection as a possible treatment.   PRECAUTIONS: none     WEIGHT BEARING RESTRICTIONS: No  PAIN:  Are you having pain? 2/10 radial wrist pisiforme and thumb MC and CMC -increase to 4-5/10 in session   FALLS: Has patient fallen in last 6 months? ?  LIVING ENVIRONMENT: Lives with: lives with their spouse    PLOF: on disability -patient occupation was International aid/development worker.  Because of her medical history she does not participate in cooking and laundry and cleaning.  But is active around the house.  Likes to walk.  On the computer and phone. Patient did prior to injury had pain at  the thumb.  And at the carpal bones at times.  Appear active range of motion was within functional limits   PATIENT GOALS: I want the pain better my right hand and wrist and get my motion and strength back so I can use my hand  NEXT MD VISIT: ?  OBJECTIVE:  Note: Objective measures were completed at Evaluation unless otherwise noted.  HAND DOMINANCE: Right  ADLs: Severe difficulty using right hand. Unable to grip, pull or push or hold or carry.  Limited by severe wrist stiffness.   FUNCTIONAL OUTCOME MEASURES: Next session  UPPER EXTREMITY ROM:     Active ROM Right eval Left eval R 02/11/24 R 02/15/24 R 02/23/24  Shoulder flexion       Shoulder abduction       Shoulder adduction       Shoulder extension       Shoulder internal rotation       Shoulder external rotation       Elbow flexion       Elbow extension       Wrist flexion 25  35 40 50  Wrist extension 5  10 20 15  close fist 30  Wrist ulnar deviation 10  15 18    Wrist radial  deviation 5  8 12    Wrist pronation 90  90 90 90  Wrist supination 63  60 60 70  (Blank rows = not tested)  Active ROM Right eval Left eval R 02/11/24 R 02/15/24 R 02/23/24  Thumb MCP (0-60) 50    55  Thumb IP (0-80) 50    50  Thumb Radial abd/add (0-55) 45    45   Thumb Palmar abd/add (0-45) 50    50   Thumb Opposition to Small Finger Opposition to all digits but increased pain 3rd through 5th    Opposition to all digits   Index MCP (0-90) 65   70 65 75  Index PIP (0-100) 80   90 95   Index DIP (0-70)         Long MCP (0-90)  70   75 85   Long PIP (0-100)  90   95 95   Long DIP (0-70)         Ring MCP (0-90)  70   80 90   Ring PIP (0-100)  90   95 100   Ring DIP (0-70)         Little MCP (0-90)  75   90 95   Little PIP (0-100)  90   90 100   Little DIP (0-70)         (Blank rows = not tested)  HAND FUNCTION:  02/23/24  Grip strength: Right: 8 lbs; Left: 39 lbs, Lateral pinch: Right: 1 lbs, Left: 11 lbs, and 3 point pinch: Right: 2 lbs, Left: 11 lbs if providing stability at thumb CMC had 7 lbs lat pinch and 4 3 point pinch  02/29/24 Grip strength: Right: 8 lbs; Left: 39 lbs, Lateral pinch: Right: 5 lbs, Left: 11 lbs, and 3 point pinch: Right: 3 lbs, Left: 11 lbs  COORDINATION: Impaired because of severe stiffness in right hand digits and wrist   SENSATION: Patient report nerve conduction showed minimal carpal tunnel. Will further assess   EDEMA: Appeared to have some edema in the digits as well as the hand.  COGNITION: Overall cognitive status: Within functional limits for tasks assessed     TREATMENT DATE: 02/29/24     Pt  arrive with  custom small hand base splint  that is providing radial support on thumb middle phalanges - from thumb IP to lateral CMC with Velcro through the webspace at the middle phalanges and velcro at the base of the splint around the wrist for stability. Good stability and less pain with radial and palmar abduction as well as lateral pinch and  opposition to all digits to second fold of fifth. Assess grip and 3 point and lateral pinch. Patient so increased prehension but grip about the same. Reviewed with patient light blue putty focusing on composite grip pain-free. Patient continue with 15 reps for gripping. Also at lateral 3 point pinch into putty pain-free 10 reps 2 times a day but with custom small splint for stability Attempted to add rubber band for palmar radial abduction. Patient done great with a placing hold but felt some instability at medial thumb MCP coming into adduction Held off on palmar abduction strengthening   Modalities: Done fluidotherapy today 8 minutes performing thumb palmar radial abduction, flexion of digits as well as wrist supination and flexion extension.  Patient report decreased pain able to tolerate exercises better in the Fluidotherapy better   Was able to do some soft tissue lopes to thumb webspace with 2 x 20-second pressure clip to do release adduction prior to palmar radial abduction active range of motion Patient also able to tolerate Graston tool #2 on volar forearm and wrist  Followed by Graston tool #2 for brushing as well as sweeping over the volar hand and digits. Has been to continue with thumb webspace and soft tissue lopes As well as self mobilization over the right foam roller and putty on the forearm and palm.   Reviewed with patient using elbow flexion and extension -using bicep to assist with supination and pronation active range of motion and active assisted range of motion   Patient to continue with same Wrist extension patient to wear a Benik neoprene for stability and support of the pisiform I while performing active assisted range with a closed fist on the washcloth or towel performing wrist extension stretch 10 reps Feeling the pull over the volar mid wrist.   .                                                                                                                               PATIENT EDUCATION: Education details: findings of eval and HEP  Person educated: Patient Education method: Explanation, Demonstration, Tactile cues, Verbal cues, and Handouts Education comprehension: verbalized understanding, returned demonstration, verbal cues required, and needs further education    GOALS: Goals reviewed with patient? Yes   LONG TERM GOALS: Target date: 8 wks  Patient and husband to be independent in home program for patient to decrease resting hand pain to less than 5/10. Baseline: No knowledge about splinting or modalities and home exercise program Goal status: INITIAL  2.  Right digits  AROM improve for patient to  be able to touch palm Baseline: MC flexion 65 to 75 degrees PIP flexion 80 to 90 degrees with increased pain and increase edema Goal status: INITIAL  3.  Patient pain decreased to less than 3/10 at rest for patient t at wrist to initiate PROM and AAROM  range of motion to wrist flexion extension Baseline: Wrist extension 5 degrees flexion 25.  Pain 2-9/10 at rest  Goal status: INITIAL  4.  R thumb AROM increase to [facility] for pt to do buttons, write, hold cup  Baseline: Right thumb palmar radial abduction 45 to 50 degrees with pain.  Unable to maintain position.  Wearing tape to support thumb into adduction at base.  Opposition pain to 3rd through 5th. Goal status: INITIAL  5.  Right forearm improved for patient to turn doorknobs symptoms.  Baseline: Supination 63 degrees unable to use forearm Goal status: INITIAL  6.  Right wrist active and improved for patient to perform 50% of ADLs Baseline: Wrist severely limited radial deviation5 /ulnar deviation 10; extension 5 degrees of flexion 25 with pain Goal status: INITIAL  ASSESSMENT:  CLINICAL IMPRESSION: Patient seen  for occupational therapy  for right Colles' fracture on 11/03/23.  Patient had a cast t presented in a prefab splint because a lot of pain.  Patient had  few OT sessions  but progress was limited by pain.  Patient referred by Dr. Windle Hatch with severe stiffness in right dominant hand and wrist as well as pain and numbness.  Patient has history of Ehlers-Denlos syndrome-per patient nerve conduction test showed minimal carpal tunnel.  Patient present at OT evaluation with severe stiffness in wrist in all planes.  As well as pain in the thumb and digits.  Patient reports prior to injury patient active range of motion in right wrist and hand was within functional limits did had some pain in thumb and carpal bones.  Patient hypersensitive and tender to touch as well as soft tissue mobilization.  Unable to tolerate.   NOW   Patient continues to be limited in pain and instability R thumb MC and CMC , ulnar wrist with mostly at the pisiform.  And with supination at the elbow radius head.  Modified couple of her home exercises-supination and wrist extension to provide more stability at the radius head and the pisiform eye.  Add to patient another exercise using bicep for elbow flexion extension in combination with active range of motion active assisted range of motion for supination pronation.  Patient show with custom hand-based thumb splint more stability with lateral and 3 point pinch.  Patient show increase strength as well as able to add light blue putty for 3 point pinch and lateral pinch- patient had less pain and more stability with palmar/radial abduction -opposition to fifth patient still show some discomfort but was able to do opposition to fourth sliding down to 2nd and 3rd fold.  Patient is showing great progress would like to focus on strengthening proximally as well as patient to tolerate more manual and soft tissue to the wrist and carpals.  Patient continues to have tenderness and discomfort and pain at the ulnar wrist and pisiform.  Will reach out to Dr. Kubinski for possible ultrasound-guided shot if indicated.   Patient to use modalities to decrease pain, edema -and  increased motion in combination with splint use and medication for pain.  Patient can benefit from skilled OT services to decrease pain and edema and increase motion and strength to return to prior level  of function.  PERFORMANCE DEFICITS: in functional skills including ADLs, IADLs, ROM, strength, pain, flexibility, decreased knowledge of use of DME, and UE functional use,   and psychosocial skills including environmental adaptation and routines and behaviors.   IMPAIRMENTS: are limiting patient from ADLs, IADLs, rest and sleep, play, leisure, and social participation.   COMORBIDITIES: has no other co-morbidities that affects occupational performance. Patient will benefit from skilled OT to address above impairments and improve overall function.  MODIFICATION OR ASSISTANCE TO COMPLETE EVALUATION: No modification of tasks or assist necessary to complete an evaluation.  OT OCCUPATIONAL PROFILE AND HISTORY: Problem focused assessment: Including review of records relating to presenting problem.  CLINICAL DECISION MAKING: LOW - limited treatment options, no task modification necessary  REHAB POTENTIAL: Good for goals  EVALUATION COMPLEXITY: Low     PLAN:  OT FREQUENCY: 1-2x/week  OT DURATION: 8 weeks  PLANNED INTERVENTIONS: 97168 OT Re-evaluation, 97535 self care/ADL training, 16109 therapeutic exercise, 97530 therapeutic activity, 97140 manual therapy, 97018 paraffin, 60454 fluidotherapy, 97034 contrast bath, 97033 iontophoresis, passive range of motion, patient/family education, and DME and/or AE instructions    CONSULTED AND AGREED WITH PLAN OF CARE: Patient     Heloise Lobo, OTR/L,CLT 02/29/2024, 11:47 AM

## 2024-03-02 ENCOUNTER — Telehealth: Payer: Self-pay

## 2024-03-02 NOTE — Telephone Encounter (Signed)
 PA request received fro [employer]. PA submitted through CoverMyMeds and waiting on response.  Tom Fudala  (Key: [id number]) PA Case ID #: [id number]

## 2024-03-03 ENCOUNTER — Encounter: Payer: Self-pay | Admitting: Occupational Therapy

## 2024-03-03 ENCOUNTER — Ambulatory Visit: Admitting: Occupational Therapy

## 2024-03-03 DIAGNOSIS — M79641 Pain in right hand: Secondary | ICD-10-CM

## 2024-03-03 DIAGNOSIS — M25631 Stiffness of right wrist, not elsewhere classified: Secondary | ICD-10-CM

## 2024-03-03 DIAGNOSIS — M25641 Stiffness of right hand, not elsewhere classified: Secondary | ICD-10-CM

## 2024-03-03 DIAGNOSIS — M6281 Muscle weakness (generalized): Secondary | ICD-10-CM

## 2024-03-03 DIAGNOSIS — M25531 Pain in right wrist: Secondary | ICD-10-CM

## 2024-03-03 NOTE — Therapy (Signed)
 OUTPATIENT OCCUPATIONAL THERAPY ORTHO TREATMENT  Patient Name: Autumn Zhang MRN: [id number] DOB:December 22, 1970, 53 y.o., female Today's Date: 03/03/2024  PCP: Dr David Escort REFERRING PROVIDER: Dr Windle Hatch  END OF SESSION:  OT End of Session - 03/03/24 1031     Visit Number 7    Number of Visits 14    Date for OT Re-Evaluation 03/29/24    OT Start Time 1031    OT Stop Time 1112    OT Time Calculation (min) 41 min    Activity Tolerance Patient tolerated treatment well;Patient limited by pain    Behavior During Therapy [facility] for tasks assessed/performed          Past Medical History:  Diagnosis Date   Allergy    Anemia    Anxiety    Arthritis    Asthma    Attention deficit hyperactivity disorder (ADHD), combined type 10/18/2015   Diagnosed many years ago.  Has been doing well on Ritalin  5mg  QID to six times a day. Long acting hasn't worked well in the past and she has not wanted to try Adderall. She is taking wellbutrin as well, but she has been weaning medication due to SE migraine.  She is now down to Wellbutrin one quarter tablet daily.  Last Assessment & Plan:  A/P: Stable.  Continue current dosing.  Refilled 3 months   Basal cell carcinoma 05/16/2021   Superficial, R post lower leg. [facility] [place], [place]   Basal cell carcinoma 09/03/2006   L chest, R forearm. 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   Dysautonomia (HCC)    Dysplastic nevus 05/28/2009   right lateral back, [facility] - [place], [place]   Dysplastic nevus 01/13/2023   left lower calf, mod to severe, shave removal 03/16/23, margins free 03/16/23   Ehlers-Danlos syndrome type III    Family history of colon cancer    GERD (gastroesophageal reflux disease)    Hyperlipidemia    Idiopathic small fiber peripheral  neuropathy    Leiomyoma    Mast cell activation (HCC)    Migraines    Neck pain    Occipital neuralgia    Sleep apnea    Past Surgical History:  Procedure Laterality Date   APPENDECTOMY     removed with colectomy   BREAST SURGERY     reduction   COLON SURGERY  2008   for colon cancer, ~1/3 of colon removed, primary reanastimosis   COLONOSCOPY WITH PROPOFOL  N/A 10/07/2018   Procedure: COLONOSCOPY WITH PROPOFOL ;  Surgeon: Selena Daily, MD;  Location: [facility] ENDOSCOPY;  Service: Gastroenterology;  Laterality: N/A;   COLONOSCOPY WITH PROPOFOL  N/A 08/14/2021   Procedure: COLONOSCOPY WITH PROPOFOL ;  Surgeon: Selena Daily, MD;  Location: [facility] ENDOSCOPY;  Service: Gastroenterology;  Laterality: N/A;   ENDOMETRIAL ABLATION     trigeminal stimulator     Patient Active Problem List   Diagnosis Date Noted   Psoriatic arthritis (HCC) 12/21/2023   MTHFR mutation 12/21/2023   Lymphedema 04/28/2023   Facial swelling 04/13/2023   Liver cyst 06/24/2022   Decreased appetite 06/24/2022   Generalized abdominal pain 06/24/2022   Abnormal weight gain 02/03/2022   Central obesity 02/03/2022   Impaired fasting glucose 02/03/2022   Cervical spondylosis with radiculopathy 10/09/2021   Psoriasis 10/08/2021   Exocrine pancreatic  insufficiency 09/26/2021   Urinary frequency 09/26/2021   Atypical facial pain (1ry area of Pain) (Left) 08/29/2021   Chronic ear pain (Left) 08/29/2021   Temporal headache (Left) 08/29/2021   Inner ear pain (Left) 08/29/2021   Trigeminal nerve disorder (Left) 08/29/2021   History of photophobia 08/29/2021   TMJ (temporomandibular joint syndrome) 08/29/2021   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   Polypharmacy 08/29/2021   Severe frontal headaches 08/29/2021   Chronic occipital headache (Midline) (2ry area of Pain) 08/29/2021   Chronic neck pain (posterior) (4th area of Pain) (Bilateral) (L>R) 08/29/2021   Musculoskeletal disorder involving upper  trapezius muscle 08/29/2021   Musculoskeletal disorder involving sternocleidomastoid (Left) 08/29/2021   Abnormal MRI, cervical spine (02/11/2019) 08/29/2021   Shoulder stiffness (Right) 08/29/2021   Pharmacologic therapy 08/27/2021   Disorder of skeletal system 08/27/2021   Problems influencing health status 08/27/2021   GAD (generalized anxiety disorder) 07/17/2021   Difficulty walking 07/17/2021   Headache disorder (3ry area of Pain) 07/17/2021   Photophobia 07/17/2021   Difficulty with speech 07/17/2021   Tear film insufficiency 07/15/2021   Left lower quadrant abdominal pain 07/05/2021   Palpitations 06/11/2021   Axillary lymphadenopathy 06/11/2021   Immunosuppressed status (HCC) 04/19/2021   History of seronegative inflammatory arthritis 06/28/2020   Osteopenia of multiple sites 06/14/2020   Polyarthralgia 06/12/2020   Iodine deficiency 06/12/2020   OSA on CPAP 06/12/2020   Chronic pain syndrome 06/12/2020   Psychophysiological insomnia 11/26/2019   Calcific tendinitis of shoulder (Right) 04/10/2019   History of iron  deficiency anemia 11/04/2018   History of basal cell carcinoma 10/07/2018   Vaginal dryness 09/07/2018   Chronic jaw pain (Left) 07/15/2018   Chronic migraine with aura 07/15/2018   Ehlers-Danlos syndrome, type 3 07/15/2018   Rectocele 05/21/2018   Ehlers-Danlos syndrome 03/15/2018   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   Cardiac arrhythmia 01/21/2018   Polyp of colon 01/21/2018   Chronic constipation 01/21/2018   Instability of shoulder joint (Right) 01/12/2018   Trigeminal neuralgia (V1-3) (Left) 12/25/2017   Instability of shoulder joint (Left) 12/25/2017   Nausea 12/25/2017   Occipital neuralgia 12/25/2017   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   Hiatal hernia 10/14/2017   TMJ (dislocation of temporomandibular joint) 09/29/2017   Irritable bowel syndrome 07/02/2017   Anal fissure 07/02/2017   Anemia  07/02/2017   Gastroesophageal reflux disease 07/02/2017   Hemorrhoids 07/02/2017   Malignant neoplasm of skin 07/02/2017   Malignant tumor of colon (HCC) 07/02/2017   Arthritis 07/02/2017   Fibromyalgia 07/02/2017   Pulsatile tinnitus of ear (Left) 12/12/2016   Restless leg syndrome 08/01/2016   Cervical dystonia 03/21/2016   Cervical spine disease 03/21/2016   Cervicalgia 02/06/2016   Paresthesia 02/06/2016   Hypercholesterolemia 12/24/2015   Iron  deficiency anemia 10/18/2015   Vitamin D  deficiency disease 10/18/2015   Midline low back pain without sciatica 02/06/2015   Hyperglycemia 10/30/2014   History of colon cancer 09/27/2014   Postural orthostatic tachycardia syndrome 09/16/2003   Chronic fatigue 09/15/2000   Asthma 09/15/1988   Dyspnea 09/15/1988    ONSET DATE: 11/03/23  REFERRING DIAG: R wrist fx with numbness and pain   THERAPY DIAG:  Pain in right hand  Pain in right wrist  Stiffness of right hand, not elsewhere classified  Stiffness of right wrist, not elsewhere classified  Muscle weakness (generalized)  Rationale for Evaluation and Treatment: Rehabilitation  SUBJECTIVE:   SUBJECTIVE STATEMENT: Since last time I have been swelling pain at  that after meals for my inside of my wrist.  The rotation and the wrist motion.  Nail looked like compared to the other side and it looks not the same.   Pt accompanied by: self  PERTINENT HISTORY: 01/13/24 ortho note Dr Windle Hatch  I personally reviewed and visualized the imaging studies if available.  - EMG/NCS RUE = evaluate for any acute nerve injury from her distal radius fracture causing her reported symptoms  Assessment:   ICD-10-CM  1. Closed Colles' fracture of right radius, initial encounter S52.531A  2. Stiffness of right wrist joint M25.631  3. Numbness and tingling in right hand R20.0  R20.2   Plan:   I have discussed the nature of her current subjective complaints, clinical examination, test  results and have reviewed treatment options. The plan is to do the following;  - The patient has ongoing right wrist pain, swelling, decreased motion, hand numbness and tingling, difficulty with hand function after a distal radius fracture on 11/03/2023. She seems to have been treated appropriately with splint followed by cast followed by brace then referred to occupational therapy. Some of her symptoms could simply be due to ordinary sequela I of injury and recovery has joint stiffness could be prolonged for up to 6-12 months. She could be developing carpal tunnel syndrome which can occur with a distal radius fracture as well. I am unsure about her ulnar sided fingers issue? This could be some nerve injury versus biomechanical abnormality because of significant loss to wrist motion versus developing complex regional pain syndrome. - She has had recent x-rays that show healing to her nondisplaced, extra-articular distal radius fracture. There is some osteopenia surrounding the joint which could be from disuse/immobilization. She has significant decreased her wrist motion. Pain limits a lot of examination. I discussed the normal recovery after distal radius fracture and how some of the things she is feeling can occur. Because of the complexity of her past medical history and her reported issues that seem neurologic, I recommended EMG ASAP to evaluate for any nerve damage. I also referred her to a different occupational therapist to see if they can provide any different treatments. Depending on the EMG results, she may need referral to neurosurgery versus hand surgery specialist versus general orthopedic surgeon. - Daily activity as tolerated. Modify activity as needed according to symptoms. No limitations to pain-free weight bearing. No need for immobilization or bracing. - Refer to OT for pain relieving modalities, manual techniques as indicated, home exercise planning. Continue home exercise program to maintain  strength, flexibility, and endurance. - Use chronic medications, over-the-counter medicines, topical diclofenac /pain cream, relative rest, compression, massage, and ice/heat as needed for pain.  - Follow up depending on EMG results. I did discuss that we could consider carpal tunnel steroid injection as a possible treatment.   PRECAUTIONS: none     WEIGHT BEARING RESTRICTIONS: No  PAIN:  Are you having pain?  5/10 over the pisiform and ulnar wrist to touch or motion FALLS: Has patient fallen in last 6 months? ?  LIVING ENVIRONMENT: Lives with: lives with their spouse    PLOF: on disability -patient occupation was International aid/development worker.  Because of her medical history she does not participate in cooking and laundry and cleaning.  But is active around the house.  Likes to walk.  On the computer and phone. Patient did prior to injury had pain at the thumb.  And at the carpal bones at times.  Appear active range of motion was within functional  limits   PATIENT GOALS: I want the pain better my right hand and wrist and get my motion and strength back so I can use my hand  NEXT MD VISIT: ?  OBJECTIVE:  Note: Objective measures were completed at Evaluation unless otherwise noted.  HAND DOMINANCE: Right  ADLs: Severe difficulty using right hand. Unable to grip, pull or push or hold or carry.  Limited by severe wrist stiffness.   FUNCTIONAL OUTCOME MEASURES: Next session  UPPER EXTREMITY ROM:     Active ROM Right eval Left eval R 02/11/24 R 02/15/24 R 02/23/24  Shoulder flexion       Shoulder abduction       Shoulder adduction       Shoulder extension       Shoulder internal rotation       Shoulder external rotation       Elbow flexion       Elbow extension       Wrist flexion 25  35 40 50  Wrist extension 5  10 20 15  close fist 30  Wrist ulnar deviation 10  15 18    Wrist radial deviation 5  8 12    Wrist pronation 90  90 90 90  Wrist supination 63  60 60 70  (Blank rows = not  tested)  Active ROM Right eval Left eval R 02/11/24 R 02/15/24 R 02/23/24  Thumb MCP (0-60) 50    55  Thumb IP (0-80) 50    50  Thumb Radial abd/add (0-55) 45    45   Thumb Palmar abd/add (0-45) 50    50   Thumb Opposition to Small Finger Opposition to all digits but increased pain 3rd through 5th    Opposition to all digits   Index MCP (0-90) 65   70 65 75  Index PIP (0-100) 80   90 95   Index DIP (0-70)         Long MCP (0-90)  70   75 85   Long PIP (0-100)  90   95 95   Long DIP (0-70)         Ring MCP (0-90)  70   80 90   Ring PIP (0-100)  90   95 100   Ring DIP (0-70)         Little MCP (0-90)  75   90 95   Little PIP (0-100)  90   90 100   Little DIP (0-70)         (Blank rows = not tested)  HAND FUNCTION:  02/23/24  Grip strength: Right: 8 lbs; Left: 39 lbs, Lateral pinch: Right: 1 lbs, Left: 11 lbs, and 3 point pinch: Right: 2 lbs, Left: 11 lbs if providing stability at thumb CMC had 7 lbs lat pinch and 4 3 point pinch  02/29/24 Grip strength: Right: 8 lbs; Left: 39 lbs, Lateral pinch: Right: 5 lbs, Left: 11 lbs, and 3 point pinch: Right: 3 lbs, Left: 11 lbs  COORDINATION: Impaired because of severe stiffness in right hand digits and wrist   SENSATION: Patient report nerve conduction showed minimal carpal tunnel. Will further assess   EDEMA: Appeared to have some edema in the digits as well as the hand.  COGNITION: Overall cognitive status: Within functional limits for tasks assessed     TREATMENT DATE: 03/03/24     Pt arrive with  custom small hand base splint  that is providing radial support on thumb middle phalanges -  from thumb IP to lateral CMC with Velcro through the webspace at the middle phalanges and velcro at the base of the splint around the wrist for stability. Good stability and less pain with radial and palmar abduction as well as lateral pinch and opposition to all digits to second fold of fifth.  patient light blue putty focusing on composite grip  pain-free at pisiform and Patient continue with 15 reps for gripping. Also at lateral 3 point pinch into putty pain-free 10 reps 2 times a day but with custom small splint for stability Assess patient's anatomy at the ulnar wrist.  Discussed pictures as well as the results of being immobilized for so long.  The ligaments ,the tendons, the carpal bones. Did talk to Dr. Windle Hatch this morning prior to patient's appointment patient to call for follow-up to assess maybe with ultrasound to see if there is any intervention that can be done to decrease swelling and pain at the ulnar wrist and pisiform he  Modalities: Done fluidotherapy today 10 minutes performing thumb palmar radial abduction, flexion of digits as well as wrist supination and flexion extension.  Patient report decreased pain     Patient to continue over the weekend with home exercises that does not aggravate her pain more than a 2/10 at the ulnar wrist.  Assess discussed with patient silver ring splints.  Patient asking the past.  Looked at website discussed different designs for stability for the thumb.  More lateral support needed. Explained designs. Patient can benefit from OT to do a custom oval 8 with stability at the proximal phalanges of the thumb prior to investing ordering silver splint.  Aaron Aas                                                                                                                              PATIENT EDUCATION: Education details: findings of eval and HEP  Person educated: Patient Education method: Explanation, Demonstration, Tactile cues, Verbal cues, and Handouts Education comprehension: verbalized understanding, returned demonstration, verbal cues required, and needs further education    GOALS: Goals reviewed with patient? Yes   LONG TERM GOALS: Target date: 8 wks  Patient and husband to be independent in home program for patient to decrease resting hand pain to less than 5/10. Baseline:  No knowledge about splinting or modalities and home exercise program Goal status: INITIAL  2.  Right digits  AROM improve for patient to be able to touch palm Baseline: MC flexion 65 to 75 degrees PIP flexion 80 to 90 degrees with increased pain and increase edema Goal status: INITIAL  3.  Patient pain decreased to less than 3/10 at rest for patient t at wrist to initiate PROM and AAROM  range of motion to wrist flexion extension Baseline: Wrist extension 5 degrees flexion 25.  Pain 2-9/10 at rest  Goal status: INITIAL  4.  R thumb AROM increase to [facility] for pt to do buttons, write, hold cup  Baseline: Right thumb palmar radial abduction 45 to 50 degrees with pain.  Unable to maintain position.  Wearing tape to support thumb into adduction at base.  Opposition pain to 3rd through 5th. Goal status: INITIAL  5.  Right forearm improved for patient to turn doorknobs symptoms.  Baseline: Supination 63 degrees unable to use forearm Goal status: INITIAL  6.  Right wrist active and improved for patient to perform 50% of ADLs Baseline: Wrist severely limited radial deviation5 /ulnar deviation 10; extension 5 degrees of flexion 25 with pain Goal status: INITIAL  ASSESSMENT:  CLINICAL IMPRESSION: Patient seen  for occupational therapy  for right Colles' fracture on 11/03/23.  Patient had a cast t presented in a prefab splint because a lot of pain.  Patient had  few OT sessions but progress was limited by pain.  Patient referred by Dr. Windle Hatch with severe stiffness in right dominant hand and wrist as well as pain and numbness.  Patient has history of Ehlers-Denlos syndrome-per patient nerve conduction test showed minimal carpal tunnel.  Patient present at OT evaluation with severe stiffness in wrist in all planes.  As well as pain in the thumb and digits.  Patient reports prior to injury patient active range of motion in right wrist and hand was within functional limits did had some pain in thumb and  carpal bones.  Patient hypersensitive and tender to touch as well as soft tissue mobilization.  Unable to tolerate.   NOW   since start of care patient made great progress in digit flexion as well as thumb palmar radial abduction.  As well as opposition.  Patient able to do light putty for gripping as well as prehension strength with a custom hand-based lateral support splint on thumb.  Patient also increase supination to 70 degrees flexion to 50 and some improvement radial ulnar deviation-at this time limited in pain and edema over the ulnar wrist the pisiform may and supination wrist extension as well as strengthening.  Did talk to Dr. Windle Hatch this morning prior to patient's appointment.  Patient to call and get a follow-up appointment for a possible further assessment with ultrasound possible shot or assistance to progress patient.  .  Patient continues to have tenderness and discomfort and pain at the ulnar wrist and pisiform.   Patient to use modalities to decrease pain, edema -and increased motion in combination with splint use and medication for pain.  Patient can benefit from skilled OT services to decrease pain and edema and increase motion and strength to return to prior level of function.  PERFORMANCE DEFICITS: in functional skills including ADLs, IADLs, ROM, strength, pain, flexibility, decreased knowledge of use of DME, and UE functional use,   and psychosocial skills including environmental adaptation and routines and behaviors.   IMPAIRMENTS: are limiting patient from ADLs, IADLs, rest and sleep, play, leisure, and social participation.   COMORBIDITIES: has no other co-morbidities that affects occupational performance. Patient will benefit from skilled OT to address above impairments and improve overall function.  MODIFICATION OR ASSISTANCE TO COMPLETE EVALUATION: No modification of tasks or assist necessary to complete an evaluation.  OT OCCUPATIONAL PROFILE AND HISTORY: Problem focused  assessment: Including review of records relating to presenting problem.  CLINICAL DECISION MAKING: LOW - limited treatment options, no task modification necessary  REHAB POTENTIAL: Good for goals  EVALUATION COMPLEXITY: Low     PLAN:  OT FREQUENCY: 1-2x/week  OT DURATION: 8 weeks  PLANNED INTERVENTIONS: 54098 OT Re-evaluation, 97535 self care/ADL training,  97110 therapeutic exercise, 97530 therapeutic activity, 97140 manual therapy, 97018 paraffin, 64403 fluidotherapy, 97034 contrast bath, 97033 iontophoresis, passive range of motion, patient/family education, and DME and/or AE instructions    CONSULTED AND AGREED WITH PLAN OF CARE: Patient     Heloise Lobo, OTR/L,CLT 03/03/2024, 11:22 AM

## 2024-03-07 ENCOUNTER — Ambulatory Visit: Admitting: Occupational Therapy

## 2024-03-07 DIAGNOSIS — M25531 Pain in right wrist: Secondary | ICD-10-CM

## 2024-03-07 DIAGNOSIS — M79641 Pain in right hand: Secondary | ICD-10-CM

## 2024-03-07 DIAGNOSIS — M6281 Muscle weakness (generalized): Secondary | ICD-10-CM

## 2024-03-07 DIAGNOSIS — M25631 Stiffness of right wrist, not elsewhere classified: Secondary | ICD-10-CM

## 2024-03-07 DIAGNOSIS — M25641 Stiffness of right hand, not elsewhere classified: Secondary | ICD-10-CM

## 2024-03-07 NOTE — Therapy (Signed)
 OUTPATIENT OCCUPATIONAL THERAPY ORTHO TREATMENT  Patient Name: Autumn Zhang MRN: [id number] DOB:[birth year], 53 y.o., female Today's Date: 03/07/2024  PCP: Dr Myrla REFERRING PROVIDER: Dr Sharrie  END OF SESSION:  OT End of Session - 03/07/24 1030     Visit Number 8    Number of Visits 14    Date for OT Re-Evaluation 03/29/24    OT Start Time 1030    Activity Tolerance Patient tolerated treatment well;Patient limited by pain    Behavior During Therapy [facility] for tasks assessed/performed          Past Medical History:  Diagnosis Date   Allergy    Anemia    Anxiety    Arthritis    Asthma    Attention deficit hyperactivity disorder (ADHD), combined type 10/18/2015   Diagnosed many years ago.  Has been doing well on Ritalin  5mg  QID to six times a day. Long acting hasn't worked well in the past and she has not wanted to try Adderall. She is taking wellbutrin as well, but she has been weaning medication due to SE migraine.  She is now down to Wellbutrin one quarter tablet daily.  Last Assessment & Plan:  A/P: Stable.  Continue current dosing.  Refilled 3 months   Basal cell carcinoma 05/16/2021   Superficial, R post lower leg. [facility] [place], [place]   Basal cell carcinoma 09/03/2006   L chest, R forearm. 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   Dysautonomia (HCC)    Dysplastic nevus 05/28/2009   right lateral back, [facility] [place], [place]   Dysplastic nevus 01/13/2023   left lower calf, mod to severe, shave removal 03/16/23, margins free 03/16/23   Ehlers-Danlos syndrome type III    Family history of colon cancer    GERD (gastroesophageal reflux disease)    Hyperlipidemia    Idiopathic small fiber peripheral neuropathy    Leiomyoma    Mast cell activation (HCC)     Migraines    Neck pain    Occipital neuralgia    Sleep apnea    Past Surgical History:  Procedure Laterality Date   APPENDECTOMY     removed with colectomy   BREAST SURGERY     reduction   COLON SURGERY  2008   for colon cancer, ~1/3 of colon removed, primary reanastimosis   COLONOSCOPY WITH PROPOFOL  N/A 10/07/2018   Procedure: COLONOSCOPY WITH PROPOFOL ;  Surgeon: Unk Corinn Skiff, MD;  Location: [facility] ENDOSCOPY;  Service: Gastroenterology;  Laterality: N/A;   COLONOSCOPY WITH PROPOFOL  N/A 08/14/2021   Procedure: COLONOSCOPY WITH PROPOFOL ;  Surgeon: Unk Corinn Skiff, MD;  Location: [facility] ENDOSCOPY;  Service: Gastroenterology;  Laterality: N/A;   ENDOMETRIAL ABLATION     trigeminal stimulator     Patient Active Problem List   Diagnosis Date Noted   Psoriatic arthritis (HCC) 12/21/2023   MTHFR mutation 12/21/2023   Lymphedema 04/28/2023   Facial swelling 04/13/2023   Liver cyst 06/24/2022   Decreased appetite 06/24/2022   Generalized abdominal pain 06/24/2022   Abnormal weight gain 02/03/2022   Central obesity 02/03/2022   Impaired fasting glucose 02/03/2022   Cervical spondylosis with radiculopathy 10/09/2021   Psoriasis 10/08/2021   Exocrine pancreatic insufficiency 09/26/2021   Urinary frequency 09/26/2021   Atypical facial pain (1ry area of Pain) (  Left) 08/29/2021   Chronic ear pain (Left) 08/29/2021   Temporal headache (Left) 08/29/2021   Inner ear pain (Left) 08/29/2021   Trigeminal nerve disorder (Left) 08/29/2021   History of photophobia 08/29/2021   TMJ (temporomandibular joint syndrome) 08/29/2021   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   Polypharmacy 08/29/2021   Severe frontal headaches 08/29/2021   Chronic occipital headache (Midline) (2ry area of Pain) 08/29/2021   Chronic neck pain (posterior) (4th area of Pain) (Bilateral) (L>R) 08/29/2021   Musculoskeletal disorder involving upper trapezius muscle 08/29/2021   Musculoskeletal disorder  involving sternocleidomastoid (Left) 08/29/2021   Abnormal MRI, cervical spine (02/11/2019) 08/29/2021   Shoulder stiffness (Right) 08/29/2021   Pharmacologic therapy 08/27/2021   Disorder of skeletal system 08/27/2021   Problems influencing health status 08/27/2021   GAD (generalized anxiety disorder) 07/17/2021   Difficulty walking 07/17/2021   Headache disorder (3ry area of Pain) 07/17/2021   Photophobia 07/17/2021   Difficulty with speech 07/17/2021   Tear film insufficiency 07/15/2021   Left lower quadrant abdominal pain 07/05/2021   Palpitations 06/11/2021   Axillary lymphadenopathy 06/11/2021   Immunosuppressed status (HCC) 04/19/2021   History of seronegative inflammatory arthritis 06/28/2020   Osteopenia of multiple sites 06/14/2020   Polyarthralgia 06/12/2020   Iodine deficiency 06/12/2020   OSA on CPAP 06/12/2020   Chronic pain syndrome 06/12/2020   Psychophysiological insomnia 11/26/2019   Calcific tendinitis of shoulder (Right) 04/10/2019   History of iron  deficiency anemia 11/04/2018   History of basal cell carcinoma 10/07/2018   Vaginal dryness 09/07/2018   Chronic jaw pain (Left) 07/15/2018   Chronic migraine with aura 07/15/2018   Ehlers-Danlos syndrome, type 3 07/15/2018   Rectocele 05/21/2018   Ehlers-Danlos syndrome 03/15/2018   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   Cardiac arrhythmia 01/21/2018   Polyp of colon 01/21/2018   Chronic constipation 01/21/2018   Instability of shoulder joint (Right) 01/12/2018   Trigeminal neuralgia (V1-3) (Left) 12/25/2017   Instability of shoulder joint (Left) 12/25/2017   Nausea 12/25/2017   Occipital neuralgia 12/25/2017   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   Hiatal hernia 10/14/2017   TMJ (dislocation of temporomandibular joint) 09/29/2017   Irritable bowel syndrome 07/02/2017   Anal fissure 07/02/2017   Anemia 07/02/2017   Gastroesophageal reflux disease 07/02/2017    Hemorrhoids 07/02/2017   Malignant neoplasm of skin 07/02/2017   Malignant tumor of colon (HCC) 07/02/2017   Arthritis 07/02/2017   Fibromyalgia 07/02/2017   Pulsatile tinnitus of ear (Left) 12/12/2016   Restless leg syndrome 08/01/2016   Cervical dystonia 03/21/2016   Cervical spine disease 03/21/2016   Cervicalgia 02/06/2016   Paresthesia 02/06/2016   Hypercholesterolemia 12/24/2015   Iron  deficiency anemia 10/18/2015   Vitamin D  deficiency disease 10/18/2015   Midline low back pain without sciatica 02/06/2015   Hyperglycemia 10/30/2014   History of colon cancer 09/27/2014   Postural orthostatic tachycardia syndrome 09/16/2003   Chronic fatigue 09/15/2000   Asthma 09/15/1988   Dyspnea 09/15/1988    ONSET DATE: 11/03/23  REFERRING DIAG: R wrist fx with numbness and pain   THERAPY DIAG:  Pain in right hand  Pain in right wrist  Stiffness of right hand, not elsewhere classified  Stiffness of right wrist, not elsewhere classified  Muscle weakness (generalized)  Rationale for Evaluation and Treatment: Rehabilitation  SUBJECTIVE:   SUBJECTIVE STATEMENT: I could not get an appointment with Dr. Sharrie until 7 July.  But I do want to work on my thumb stability and motion  and strength.  I am pain is better and maintaining my wrist  Pt accompanied by: self  PERTINENT HISTORY: 01/13/24 ortho note Dr Sharrie  I personally reviewed and visualized the imaging studies if available.  - EMG/NCS RUE = evaluate for any acute nerve injury from her distal radius fracture causing her reported symptoms  Assessment:   ICD-10-CM  1. Closed Colles' fracture of right radius, initial encounter S52.531A  2. Stiffness of right wrist joint M25.631  3. Numbness and tingling in right hand R20.0  R20.2   Plan:   I have discussed the nature of her current subjective complaints, clinical examination, test results and have reviewed treatment options. The plan is to do the following;  -  The patient has ongoing right wrist pain, swelling, decreased motion, hand numbness and tingling, difficulty with hand function after a distal radius fracture on 11/03/2023. She seems to have been treated appropriately with splint followed by cast followed by brace then referred to occupational therapy. Some of her symptoms could simply be due to ordinary sequela I of injury and recovery has joint stiffness could be prolonged for up to 6-12 months. She could be developing carpal tunnel syndrome which can occur with a distal radius fracture as well. I am unsure about her ulnar sided fingers issue? This could be some nerve injury versus biomechanical abnormality because of significant loss to wrist motion versus developing complex regional pain syndrome. - She has had recent x-rays that show healing to her nondisplaced, extra-articular distal radius fracture. There is some osteopenia surrounding the joint which could be from disuse/immobilization. She has significant decreased her wrist motion. Pain limits a lot of examination. I discussed the normal recovery after distal radius fracture and how some of the things she is feeling can occur. Because of the complexity of her past medical history and her reported issues that seem neurologic, I recommended EMG ASAP to evaluate for any nerve damage. I also referred her to a different occupational therapist to see if they can provide any different treatments. Depending on the EMG results, she may need referral to neurosurgery versus hand surgery specialist versus general orthopedic surgeon. - Daily activity as tolerated. Modify activity as needed according to symptoms. No limitations to pain-free weight bearing. No need for immobilization or bracing. - Refer to OT for pain relieving modalities, manual techniques as indicated, home exercise planning. Continue home exercise program to maintain strength, flexibility, and endurance. - Use chronic medications, over-the-counter  medicines, topical diclofenac /pain cream, relative rest, compression, massage, and ice/heat as needed for pain.  - Follow up depending on EMG results. I did discuss that we could consider carpal tunnel steroid injection as a possible treatment.   PRECAUTIONS: none     WEIGHT BEARING RESTRICTIONS: No  PAIN:  Are you having pain?  5/10 over the pisiform and ulnar wrist to touch or motion FALLS: Has patient fallen in last 6 months? ?  LIVING ENVIRONMENT: Lives with: lives with their spouse    PLOF: on disability -patient occupation was International aid/development worker.  Because of her medical history she does not participate in cooking and laundry and cleaning.  But is active around the house.  Likes to walk.  On the computer and phone. Patient did prior to injury had pain at the thumb.  And at the carpal bones at times.  Appear active range of motion was within functional limits   PATIENT GOALS: I want the pain better my right hand and wrist and get my motion and  strength back so I can use my hand  NEXT MD VISIT: ?  OBJECTIVE:  Note: Objective measures were completed at Evaluation unless otherwise noted.  HAND DOMINANCE: Right  ADLs: Severe difficulty using right hand. Unable to grip, pull or push or hold or carry.  Limited by severe wrist stiffness.   FUNCTIONAL OUTCOME MEASURES: Next session  UPPER EXTREMITY ROM:     Active ROM Right eval Left eval R 02/11/24 R 02/15/24 R 02/23/24 R 03/07/24  Shoulder flexion        Shoulder abduction        Shoulder adduction        Shoulder extension        Shoulder internal rotation        Shoulder external rotation        Elbow flexion        Elbow extension        Wrist flexion 25  35 40 50 55  Wrist extension 5  10 20 15  close fist 30 20  Wrist ulnar deviation 10  15 18  20   Wrist radial deviation 5  8 12  10   Wrist pronation 90  90 90 90   Wrist supination 63  60 60 70 70  (Blank rows = not tested)  Active ROM Right eval Left eval  R 02/11/24 R 02/15/24 R 02/23/24  Thumb MCP (0-60) 50    55  Thumb IP (0-80) 50    50  Thumb Radial abd/add (0-55) 45    45   Thumb Palmar abd/add (0-45) 50    50   Thumb Opposition to Small Finger Opposition to all digits but increased pain 3rd through 5th    Opposition to all digits   Index MCP (0-90) 65   70 65 75  Index PIP (0-100) 80   90 95   Index DIP (0-70)         Long MCP (0-90)  70   75 85   Long PIP (0-100)  90   95 95   Long DIP (0-70)         Ring MCP (0-90)  70   80 90   Ring PIP (0-100)  90   95 100   Ring DIP (0-70)         Little MCP (0-90)  75   90 95   Little PIP (0-100)  90   90 100   Little DIP (0-70)         (Blank rows = not tested)  HAND FUNCTION:  02/23/24  Grip strength: Right: 8 lbs; Left: 39 lbs, Lateral pinch: Right: 1 lbs, Left: 11 lbs, and 3 point pinch: Right: 2 lbs, Left: 11 lbs if providing stability at thumb CMC had 7 lbs lat pinch and 4 3 point pinch  02/29/24 Grip strength: Right: 8 lbs; Left: 39 lbs, Lateral pinch: Right: 5 lbs, Left: 11 lbs, and 3 point pinch: Right: 3 lbs, Left: 11 lbs 03/07/24 Grip strength: NT  Lateral pinch: Right: 6 oval 8 custom lbs, Left: 11 lbs, and 3 point pinch: Right: 4 Oval 8 custom lbs, Left: 11 lbs  COORDINATION: Impaired because of severe stiffness in right hand digits and wrist   SENSATION: Patient report nerve conduction showed minimal carpal tunnel. Will further assess   EDEMA: Appeared to have some edema in the digits as well as the hand.  COGNITION: Overall cognitive status: Within functional limits for tasks assessed     TREATMENT DATE:  03/07/24     Pt arrive with  custom small hand base splint  that is providing radial support on thumb middle phalanges - from thumb IP to lateral CMC with Velcro through the webspace at the middle phalanges and velcro at the base of the splint around the wrist for stability. But limits patient's IP flexion with opposition and lateral 3-point pinch  Patient not able  to get appointment with Dr. Sharrie until 7 July. Patient report pain at wrist and pisiform a better than last week. Reinforced with patient the importance of maintaining wrist motion at least. Measurements taken see flowsheet flexion improved as well as ulnar deviation   patient to continue with light blue putty focusing on composite grip pain-free at pisiform  Patient continue with 15 reps for gripping. Also at lateral 3 point pinch into putty pain-free 10 reps 2 times a day but with custom small splint for stability  Modalities: Done fluidotherapy today 8 minutes with 1 rotation of 2 minutes ice performing thumb palmar radial abduction, flexion of digits as well as wrist supination and flexion extension.  While OT initiate orthotics fitting and training     Assess discussed with patient silver ring splint last session Patient contacted company.  OT and patient looked at website again discussed different designs for stability for the thumb.  More lateral support needed. Explained designs.  OT fabricated lateral support splint for thumb this date-oval 8 custom with stability at the proximal phalanges of the thumb with middle phalanges volar support. Assessment above for 3 point and lateral pinch was done with oval 8 on.  Patient had more strength, less pain and more stability with the overlaid on.  Also discussed with patient passive range of motion to thumb IP and can initiate thumb on fingers and fingers on thumb opposition of large object out of palm to 2nd and 3rd, and from palm to 4th and 5th.   SABRA                                                                                                                              PATIENT EDUCATION: Education details: findings of eval and HEP  Person educated: Patient Education method: Explanation, Demonstration, Tactile cues, Verbal cues, and Handouts Education comprehension: verbalized understanding, returned demonstration, verbal  cues required, and needs further education    GOALS: Goals reviewed with patient? Yes   LONG TERM GOALS: Target date: 8 wks  Patient and husband to be independent in home program for patient to decrease resting hand pain to less than 5/10. Baseline: No knowledge about splinting or modalities and home exercise program Goal status: INITIAL  2.  Right digits  AROM improve for patient to be able to touch palm Baseline: MC flexion 65 to 75 degrees PIP flexion 80 to 90 degrees with increased pain and increase edema Goal status: INITIAL  3.  Patient pain decreased to less than 3/10 at rest for patient t  at wrist to initiate PROM and AAROM  range of motion to wrist flexion extension Baseline: Wrist extension 5 degrees flexion 25.  Pain 2-9/10 at rest  Goal status: INITIAL  4.  R thumb AROM increase to [facility] for pt to do buttons, write, hold cup  Baseline: Right thumb palmar radial abduction 45 to 50 degrees with pain.  Unable to maintain position.  Wearing tape to support thumb into adduction at base.  Opposition pain to 3rd through 5th. Goal status: INITIAL  5.  Right forearm improved for patient to turn doorknobs symptoms.  Baseline: Supination 63 degrees unable to use forearm Goal status: INITIAL  6.  Right wrist active and improved for patient to perform 50% of ADLs Baseline: Wrist severely limited radial deviation5 /ulnar deviation 10; extension 5 degrees of flexion 25 with pain Goal status: INITIAL  ASSESSMENT:  CLINICAL IMPRESSION: Patient seen  for occupational therapy  for right Colles' fracture on 11/03/23.  Patient had a cast t presented in a prefab splint because a lot of pain.  Patient had  few OT sessions but progress was limited by pain.  Patient referred by Dr. Sharrie with severe stiffness in right dominant hand and wrist as well as pain and numbness.  Patient has history of Ehlers-Denlos syndrome-per patient nerve conduction test showed minimal carpal tunnel.  Patient  present at OT evaluation with severe stiffness in wrist in all planes.  As well as pain in the thumb and digits.  Patient reports prior to injury patient active range of motion in right wrist and hand was within functional limits did had some pain in thumb and carpal bones.  Patient hypersensitive and tender to touch as well as soft tissue mobilization.  Unable to tolerate.   NOW   since start of care patient made great progress in digit flexion as well as thumb palmar radial abduction.  As well as opposition.  Patient able to do light putty for gripping as well as prehension strength  -did change patient to a custom oval 8 splint fabricated today with a lateral support on the thumb.  Patient to do 3 point and lateral pinch and putty.  Patient had with splint on less pain increase stability and increase strength.  Patient was able to maintain her range of motion in the wrist and forearm with decreased pain.  Flexion and ulnar deviation improved.  At this time limited in pain and edema over the ulnar wrist the pisiform may and supination wrist extension as well as strengthening.  Patient could not get appointment with Dr. Sharrie until 7 July.   Patient continues to have tenderness and discomfort and pain at the ulnar wrist and pisiform.   Patient to use modalities to decrease pain, edema -and increased motion in combination with splint use and medication for pain.  Patient can benefit from skilled OT services to decrease pain and edema and increase motion and strength to return to prior level of function.  PERFORMANCE DEFICITS: in functional skills including ADLs, IADLs, ROM, strength, pain, flexibility, decreased knowledge of use of DME, and UE functional use,   and psychosocial skills including environmental adaptation and routines and behaviors.   IMPAIRMENTS: are limiting patient from ADLs, IADLs, rest and sleep, play, leisure, and social participation.   COMORBIDITIES: has no other co-morbidities that  affects occupational performance. Patient will benefit from skilled OT to address above impairments and improve overall function.  MODIFICATION OR ASSISTANCE TO COMPLETE EVALUATION: No modification of tasks or assist necessary  to complete an evaluation.  OT OCCUPATIONAL PROFILE AND HISTORY: Problem focused assessment: Including review of records relating to presenting problem.  CLINICAL DECISION MAKING: LOW - limited treatment options, no task modification necessary  REHAB POTENTIAL: Good for goals  EVALUATION COMPLEXITY: Low     PLAN:  OT FREQUENCY: 1-2x/week  OT DURATION: 8 weeks  PLANNED INTERVENTIONS: 97168 OT Re-evaluation, 97535 self care/ADL training, 02889 therapeutic exercise, 97530 therapeutic activity, 97140 manual therapy, 97018 paraffin, 02960 fluidotherapy, 97034 contrast bath, 97033 iontophoresis, passive range of motion, patient/family education, and DME and/or AE instructions    CONSULTED AND AGREED WITH PLAN OF CARE: Patient     Ancel Peters, OTR/L,CLT 03/07/2024, 10:31 AM

## 2024-03-09 NOTE — Telephone Encounter (Signed)
 PA was denied due to there being no results of positive fungal diagnostic test.

## 2024-03-10 ENCOUNTER — Ambulatory Visit: Admitting: Occupational Therapy

## 2024-03-10 DIAGNOSIS — M25631 Stiffness of right wrist, not elsewhere classified: Secondary | ICD-10-CM

## 2024-03-10 DIAGNOSIS — M25641 Stiffness of right hand, not elsewhere classified: Secondary | ICD-10-CM

## 2024-03-10 DIAGNOSIS — M6281 Muscle weakness (generalized): Secondary | ICD-10-CM

## 2024-03-10 DIAGNOSIS — M79641 Pain in right hand: Secondary | ICD-10-CM | POA: Diagnosis not present

## 2024-03-10 DIAGNOSIS — M25531 Pain in right wrist: Secondary | ICD-10-CM

## 2024-03-10 NOTE — Therapy (Signed)
 OUTPATIENT OCCUPATIONAL THERAPY ORTHO TREATMENT  Patient Name: Autumn Zhang MRN: [id number] DOB:05/06/1971, 53 y.o., female Today's Date: 03/10/2024  PCP: Dr Myrla REFERRING PROVIDER: Dr Sharrie  END OF SESSION:  OT End of Session - 03/10/24 1044     Visit Number 9    Number of Visits 14    Date for OT Re-Evaluation 03/29/24    OT Start Time 1040    Activity Tolerance Patient tolerated treatment well;Patient limited by pain    Behavior During Therapy [facility] for tasks assessed/performed          Past Medical History:  Diagnosis Date   Allergy    Anemia    Anxiety    Arthritis    Asthma    Attention deficit hyperactivity disorder (ADHD), combined type 10/18/2015   Diagnosed many years ago.  Has been doing well on Ritalin  5mg  QID to six times a day. Long acting hasn't worked well in the past and she has not wanted to try Adderall. She is taking wellbutrin as well, but she has been weaning medication due to SE migraine.  She is now down to Wellbutrin one quarter tablet daily.  Last Assessment & Plan:  A/P: Stable.  Continue current dosing.  Refilled 3 months   Basal cell carcinoma 05/16/2021   Superficial, R post lower leg. [facility] [place], [place]   Basal cell carcinoma 09/03/2006   L chest, R forearm. [facility] [place], [place]   BRCA negative 09/2018   2015 [place] at 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   Dysautonomia (HCC)    Dysplastic nevus 05/28/2009   right lateral back, [facility] [place], [place]   Dysplastic nevus 01/13/2023   left lower calf, mod to severe, shave removal 03/16/23, margins free 03/16/23   Ehlers-Danlos syndrome type III    Family history of colon cancer    GERD (gastroesophageal reflux disease)    Hyperlipidemia    Idiopathic small fiber peripheral neuropathy    Leiomyoma    Mast cell activation (HCC)     Migraines    Neck pain    Occipital neuralgia    Sleep apnea    Past Surgical History:  Procedure Laterality Date   APPENDECTOMY     removed with colectomy   BREAST SURGERY     reduction   COLON SURGERY  2008   for colon cancer, ~1/3 of colon removed, primary reanastimosis   COLONOSCOPY WITH PROPOFOL  N/A 10/07/2018   Procedure: COLONOSCOPY WITH PROPOFOL ;  Surgeon: Unk Corinn Skiff, MD;  Location: [facility] ENDOSCOPY;  Service: Gastroenterology;  Laterality: N/A;   COLONOSCOPY WITH PROPOFOL  N/A 08/14/2021   Procedure: COLONOSCOPY WITH PROPOFOL ;  Surgeon: Unk Corinn Skiff, MD;  Location: [facility] ENDOSCOPY;  Service: Gastroenterology;  Laterality: N/A;   ENDOMETRIAL ABLATION     trigeminal stimulator     Patient Active Problem List   Diagnosis Date Noted   Psoriatic arthritis (HCC) 12/21/2023   MTHFR mutation 12/21/2023   Lymphedema 04/28/2023   Facial swelling 04/13/2023   Liver cyst 06/24/2022   Decreased appetite 06/24/2022   Generalized abdominal pain 06/24/2022   Abnormal weight gain 02/03/2022   Central obesity 02/03/2022   Impaired fasting glucose 02/03/2022   Cervical spondylosis with radiculopathy 10/09/2021   Psoriasis 10/08/2021   Exocrine pancreatic insufficiency 09/26/2021   Urinary frequency 09/26/2021   Atypical facial pain (1ry area of Pain) (  Left) 08/29/2021   Chronic ear pain (Left) 08/29/2021   Temporal headache (Left) 08/29/2021   Inner ear pain (Left) 08/29/2021   Trigeminal nerve disorder (Left) 08/29/2021   History of photophobia 08/29/2021   TMJ (temporomandibular joint syndrome) 08/29/2021   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   Polypharmacy 08/29/2021   Severe frontal headaches 08/29/2021   Chronic occipital headache (Midline) (2ry area of Pain) 08/29/2021   Chronic neck pain (posterior) (4th area of Pain) (Bilateral) (L>R) 08/29/2021   Musculoskeletal disorder involving upper trapezius muscle 08/29/2021   Musculoskeletal disorder  involving sternocleidomastoid (Left) 08/29/2021   Abnormal MRI, cervical spine (02/11/2019) 08/29/2021   Shoulder stiffness (Right) 08/29/2021   Pharmacologic therapy 08/27/2021   Disorder of skeletal system 08/27/2021   Problems influencing health status 08/27/2021   GAD (generalized anxiety disorder) 07/17/2021   Difficulty walking 07/17/2021   Headache disorder (3ry area of Pain) 07/17/2021   Photophobia 07/17/2021   Difficulty with speech 07/17/2021   Tear film insufficiency 07/15/2021   Left lower quadrant abdominal pain 07/05/2021   Palpitations 06/11/2021   Axillary lymphadenopathy 06/11/2021   Immunosuppressed status (HCC) 04/19/2021   History of seronegative inflammatory arthritis 06/28/2020   Osteopenia of multiple sites 06/14/2020   Polyarthralgia 06/12/2020   Iodine deficiency 06/12/2020   OSA on CPAP 06/12/2020   Chronic pain syndrome 06/12/2020   Psychophysiological insomnia 11/26/2019   Calcific tendinitis of shoulder (Right) 04/10/2019   History of iron  deficiency anemia 11/04/2018   History of basal cell carcinoma 10/07/2018   Vaginal dryness 09/07/2018   Chronic jaw pain (Left) 07/15/2018   Chronic migraine with aura 07/15/2018   Ehlers-Danlos syndrome, type 3 07/15/2018   Rectocele 05/21/2018   Ehlers-Danlos syndrome 03/15/2018   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   Cardiac arrhythmia 01/21/2018   Polyp of colon 01/21/2018   Chronic constipation 01/21/2018   Instability of shoulder joint (Right) 01/12/2018   Trigeminal neuralgia (V1-3) (Left) 12/25/2017   Instability of shoulder joint (Left) 12/25/2017   Nausea 12/25/2017   Occipital neuralgia 12/25/2017   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   Hiatal hernia 10/14/2017   TMJ (dislocation of temporomandibular joint) 09/29/2017   Irritable bowel syndrome 07/02/2017   Anal fissure 07/02/2017   Anemia 07/02/2017   Gastroesophageal reflux disease 07/02/2017    Hemorrhoids 07/02/2017   Malignant neoplasm of skin 07/02/2017   Malignant tumor of colon (HCC) 07/02/2017   Arthritis 07/02/2017   Fibromyalgia 07/02/2017   Pulsatile tinnitus of ear (Left) 12/12/2016   Restless leg syndrome 08/01/2016   Cervical dystonia 03/21/2016   Cervical spine disease 03/21/2016   Cervicalgia 02/06/2016   Paresthesia 02/06/2016   Hypercholesterolemia 12/24/2015   Iron  deficiency anemia 10/18/2015   Vitamin D  deficiency disease 10/18/2015   Midline low back pain without sciatica 02/06/2015   Hyperglycemia 10/30/2014   History of colon cancer 09/27/2014   Postural orthostatic tachycardia syndrome 09/16/2003   Chronic fatigue 09/15/2000   Asthma 09/15/1988   Dyspnea 09/15/1988    ONSET DATE: 11/03/23  REFERRING DIAG: R wrist fx with numbness and pain   THERAPY DIAG:  Pain in right hand  Pain in right wrist  Stiffness of right hand, not elsewhere classified  Stiffness of right wrist, not elsewhere classified  Muscle weakness (generalized)  Rationale for Evaluation and Treatment: Rehabilitation  SUBJECTIVE:   SUBJECTIVE STATEMENT: This) is really working well.  I feel like more stability my thumb able to use a little more.  If we can just modify  the back and that it curves more.  Did you get the compression glove and yet  Pt accompanied by: self  PERTINENT HISTORY: 01/13/24 ortho note Dr Sharrie  I personally reviewed and visualized the imaging studies if available.  - EMG/NCS RUE = evaluate for any acute nerve injury from her distal radius fracture causing her reported symptoms  Assessment:   ICD-10-CM  1. Closed Colles' fracture of right radius, initial encounter S52.531A  2. Stiffness of right wrist joint M25.631  3. Numbness and tingling in right hand R20.0  R20.2   Plan:   I have discussed the nature of her current subjective complaints, clinical examination, test results and have reviewed treatment options. The plan is to do the  following;  - The patient has ongoing right wrist pain, swelling, decreased motion, hand numbness and tingling, difficulty with hand function after a distal radius fracture on 11/03/2023. She seems to have been treated appropriately with splint followed by cast followed by brace then referred to occupational therapy. Some of her symptoms could simply be due to ordinary sequela I of injury and recovery has joint stiffness could be prolonged for up to 6-12 months. She could be developing carpal tunnel syndrome which can occur with a distal radius fracture as well. I am unsure about her ulnar sided fingers issue? This could be some nerve injury versus biomechanical abnormality because of significant loss to wrist motion versus developing complex regional pain syndrome. - She has had recent x-rays that show healing to her nondisplaced, extra-articular distal radius fracture. There is some osteopenia surrounding the joint which could be from disuse/immobilization. She has significant decreased her wrist motion. Pain limits a lot of examination. I discussed the normal recovery after distal radius fracture and how some of the things she is feeling can occur. Because of the complexity of her past medical history and her reported issues that seem neurologic, I recommended EMG ASAP to evaluate for any nerve damage. I also referred her to a different occupational therapist to see if they can provide any different treatments. Depending on the EMG results, she may need referral to neurosurgery versus hand surgery specialist versus general orthopedic surgeon. - Daily activity as tolerated. Modify activity as needed according to symptoms. No limitations to pain-free weight bearing. No need for immobilization or bracing. - Refer to OT for pain relieving modalities, manual techniques as indicated, home exercise planning. Continue home exercise program to maintain strength, flexibility, and endurance. - Use chronic medications,  over-the-counter medicines, topical diclofenac /pain cream, relative rest, compression, massage, and ice/heat as needed for pain.  - Follow up depending on EMG results. I did discuss that we could consider carpal tunnel steroid injection as a possible treatment.   PRECAUTIONS: none     WEIGHT BEARING RESTRICTIONS: No  PAIN:  Are you having pain?  5/10 over the pisiform and ulnar wrist to touch or motion FALLS: Has patient fallen in last 6 months? ?  LIVING ENVIRONMENT: Lives with: lives with their spouse    PLOF: on disability -patient occupation was International aid/development worker.  Because of her medical history she does not participate in cooking and laundry and cleaning.  But is active around the house.  Likes to walk.  On the computer and phone. Patient did prior to injury had pain at the thumb.  And at the carpal bones at times.  Appear active range of motion was within functional limits   PATIENT GOALS: I want the pain better my right hand and wrist and  get my motion and strength back so I can use my hand  NEXT MD VISIT: ?  OBJECTIVE:  Note: Objective measures were completed at Evaluation unless otherwise noted.  HAND DOMINANCE: Right  ADLs: Severe difficulty using right hand. Unable to grip, pull or push or hold or carry.  Limited by severe wrist stiffness.   FUNCTIONAL OUTCOME MEASURES: Next session  UPPER EXTREMITY ROM:     Active ROM Right eval Left eval R 02/11/24 R 02/15/24 R 02/23/24 R 03/07/24 R 03/10/24  Shoulder flexion         Shoulder abduction         Shoulder adduction         Shoulder extension         Shoulder internal rotation         Shoulder external rotation         Elbow flexion         Elbow extension         Wrist flexion 25  35 40 50 55   Wrist extension 5  10 20 15  close fist 30 20 15  open hand, 0 close fist   Wrist ulnar deviation 10  15 18  20    Wrist radial deviation 5  8 12  10    Wrist pronation 90  90 90 90    Wrist supination 63  60 60 70 70    (Blank rows = not tested)  Active ROM Right eval Left eval R 02/11/24 R 02/15/24 R 02/23/24  Thumb MCP (0-60) 50    55  Thumb IP (0-80) 50    50  Thumb Radial abd/add (0-55) 45    45   Thumb Palmar abd/add (0-45) 50    50   Thumb Opposition to Small Finger Opposition to all digits but increased pain 3rd through 5th    Opposition to all digits   Index MCP (0-90) 65   70 65 75  Index PIP (0-100) 80   90 95   Index DIP (0-70)         Long MCP (0-90)  70   75 85   Long PIP (0-100)  90   95 95   Long DIP (0-70)         Ring MCP (0-90)  70   80 90   Ring PIP (0-100)  90   95 100   Ring DIP (0-70)         Little MCP (0-90)  75   90 95   Little PIP (0-100)  90   90 100   Little DIP (0-70)         (Blank rows = not tested)  HAND FUNCTION:  02/23/24  Grip strength: Right: 8 lbs; Left: 39 lbs, Lateral pinch: Right: 1 lbs, Left: 11 lbs, and 3 point pinch: Right: 2 lbs, Left: 11 lbs if providing stability at thumb CMC had 7 lbs lat pinch and 4 3 point pinch  02/29/24 Grip strength: Right: 8 lbs; Left: 39 lbs, Lateral pinch: Right: 5 lbs, Left: 11 lbs, and 3 point pinch: Right: 3 lbs, Left: 11 lbs 03/07/24 Grip strength: NT  Lateral pinch: Right: 6 oval 8 custom lbs, Left: 11 lbs, and 3 point pinch: Right: 4 Oval 8 custom lbs, Left: 11 lbs  03/10/24 Grip strength: R 17 with oval 8 at thumb , L 41lbs  Lateral pinch: Right: 8 oval 8 custom lbs, Left: 11 lbs, and 3 point pinch: Right: 5 Oval 8  custom lbs, Left: 11 lbs COORDINATION: Impaired because of severe stiffness in right hand digits and wrist   SENSATION: Patient report nerve conduction showed minimal carpal tunnel. Will further assess   EDEMA: Appeared to have some edema in the digits as well as the hand.  COGNITION: Overall cognitive status: Within functional limits for tasks assessed     TREATMENT DATE: 03/10/24      Patient  has appointment with Dr. Sharrie until 7 July. Patient report pain at wrist and pisiform better than  last week Reinforced with patient the importance of maintaining wrist motion at least. Especially wrist extension.  Patient compensating with long extensors. Patient 0 extension with close fist. Attempt without gravity with gravity wrist extension close fist continues 0-patient reports feeling stuck at the dorsal wrist. Reviewed with patient and husband soft tissue lopes to the carpals the metacarpals what the patient can tolerate and allow him to do.  In combination with the light facilitation of wrist extension with a loose fist.  Patient arrived with splint fabricated last visit -hand-based lateral support oval 8 splint for thumb custom providing  stability at the proximal phalanges of the thumb with middle phalanges volar support. Assessment with splint on 3 point and lateral pinch -great progress less pain more stability see flowsheet Encourage patient to hold off on prehension strength with putty and focus on functional strengthening Grip was increased to patient can do light putty with splint on. Was fitted with Isotoner glove to provide compression over the metacarpals hand as well as wrist.  To decrease edema pain.  Patient had more strength, less pain and more stability with the oval 8 Patient did need modifications to the proximal and 2 rounds of it to help with the [facility].  And provide stability at the Velcro around the wrist. Patient had 1 pound increase in lateral pinch as well as 2 more pounds of grip with modifications.     patient to continue with light blue putty focusing on composite grip pain-free at pisiform  Patient continue with 15 reps for gripping. Modalities: Done fluidotherapy today 8 minutes performing thumb palmar radial abduction, flexion of digits as well as wrist supination and flexion extension.   Also discussed with patient passive range of motion to thumb IP and can initiate thumb on fingers and fingers on thumb opposition of large object out of palm to 2nd and  3rd, and from palm to 4th and 5th.   SABRA                                                                                                                              PATIENT EDUCATION: Education details: findings of eval and HEP  Person educated: Patient Education method: Explanation, Demonstration, Tactile cues, Verbal cues, and Handouts Education comprehension: verbalized understanding, returned demonstration, verbal cues required, and needs further education    GOALS: Goals reviewed with patient? Yes   LONG TERM GOALS: Target date: 8  wks  Patient and husband to be independent in home program for patient to decrease resting hand pain to less than 5/10. Baseline: No knowledge about splinting or modalities and home exercise program Goal status: INITIAL  2.  Right digits  AROM improve for patient to be able to touch palm Baseline: MC flexion 65 to 75 degrees PIP flexion 80 to 90 degrees with increased pain and increase edema Goal status: INITIAL  3.  Patient pain decreased to less than 3/10 at rest for patient t at wrist to initiate PROM and AAROM  range of motion to wrist flexion extension Baseline: Wrist extension 5 degrees flexion 25.  Pain 2-9/10 at rest  Goal status: INITIAL  4.  R thumb AROM increase to [facility] for pt to do buttons, write, hold cup  Baseline: Right thumb palmar radial abduction 45 to 50 degrees with pain.  Unable to maintain position.  Wearing tape to support thumb into adduction at base.  Opposition pain to 3rd through 5th. Goal status: INITIAL  5.  Right forearm improved for patient to turn doorknobs symptoms.  Baseline: Supination 63 degrees unable to use forearm Goal status: INITIAL  6.  Right wrist active and improved for patient to perform 50% of ADLs Baseline: Wrist severely limited radial deviation5 /ulnar deviation 10; extension 5 degrees of flexion 25 with pain Goal status: INITIAL  ASSESSMENT:  CLINICAL IMPRESSION: Patient seen  for  occupational therapy  for right Colles' fracture on 11/03/23.  Patient had a cast t presented in a prefab splint because a lot of pain.  Patient had  few OT sessions but progress was limited by pain.  Patient referred by Dr. Sharrie with severe stiffness in right dominant hand and wrist as well as pain and numbness.  Patient has history of Ehlers-Denlos syndrome-per patient nerve conduction test showed minimal carpal tunnel.  Patient present at OT evaluation with severe stiffness in wrist in all planes.  As well as pain in the thumb and digits.  Patient reports prior to injury patient active range of motion in right wrist and hand was within functional limits did had some pain in thumb and carpal bones.  Patient hypersensitive and tender to touch as well as soft tissue mobilization.  Unable to tolerate.   NOW   since start of care patient made great progress in digit flexion as well as thumb palmar radial abduction.  As well as opposition. Pt wearing custom oval 8 splint fabricated by OT with a lateral support on the thumb.  Patient to do 3 point and lateral pinch with less pain and more stability.  Patient to focus on functional strengthening for prevention and can do light blue putty for gripping.  Patient to focus on maintaining wrist motion or increase if can tolerate until appointment Dr. Sharrie.  At this time limited in pain and edema over the ulnar wrist ,the pisiform with supination and wrist extension as well as strengthening.  Patient to focus on wrist extension with a loose fist not compensating with long extensors.    Patient continues to have tenderness and discomfort and pain at the ulnar wrist and pisiform.   Patient to use modalities to decrease pain, edema -and increased motion in combination with splint use and medication for pain.  Patient can benefit from skilled OT services to decrease pain and edema and increase motion and strength to return to prior level of function.  PERFORMANCE  DEFICITS: in functional skills including ADLs, IADLs, ROM, strength, pain, flexibility,  decreased knowledge of use of DME, and UE functional use,   and psychosocial skills including environmental adaptation and routines and behaviors.   IMPAIRMENTS: are limiting patient from ADLs, IADLs, rest and sleep, play, leisure, and social participation.   COMORBIDITIES: has no other co-morbidities that affects occupational performance. Patient will benefit from skilled OT to address above impairments and improve overall function.  MODIFICATION OR ASSISTANCE TO COMPLETE EVALUATION: No modification of tasks or assist necessary to complete an evaluation.  OT OCCUPATIONAL PROFILE AND HISTORY: Problem focused assessment: Including review of records relating to presenting problem.  CLINICAL DECISION MAKING: LOW - limited treatment options, no task modification necessary  REHAB POTENTIAL: Good for goals  EVALUATION COMPLEXITY: Low     PLAN:  OT FREQUENCY: 1-2x/week  OT DURATION: 8 weeks  PLANNED INTERVENTIONS: 97168 OT Re-evaluation, 97535 self care/ADL training, 02889 therapeutic exercise, 97530 therapeutic activity, 97140 manual therapy, 97018 paraffin, 02960 fluidotherapy, 97034 contrast bath, 97033 iontophoresis, passive range of motion, patient/family education, and DME and/or AE instructions    CONSULTED AND AGREED WITH PLAN OF CARE: Patient     Ancel Peters, OTR/L,CLT 03/10/2024, 10:45 AM

## 2024-03-14 ENCOUNTER — Encounter: Admitting: Occupational Therapy

## 2024-03-15 ENCOUNTER — Ambulatory Visit (INDEPENDENT_AMBULATORY_CARE_PROVIDER_SITE_OTHER): Admitting: Podiatry

## 2024-03-15 ENCOUNTER — Other Ambulatory Visit: Payer: Self-pay | Admitting: Podiatry

## 2024-03-15 ENCOUNTER — Ambulatory Visit: Admitting: Occupational Therapy

## 2024-03-15 ENCOUNTER — Encounter: Payer: Self-pay | Admitting: Podiatry

## 2024-03-15 VITALS — Ht 62.0 in | Wt 141.8 lb

## 2024-03-15 DIAGNOSIS — L600 Ingrowing nail: Secondary | ICD-10-CM | POA: Diagnosis not present

## 2024-03-15 MED ORDER — TERBINAFINE HCL 5 % EX SOLN: 1.0000 | Freq: Every day | CUTANEOUS | 2 refills | Status: DC

## 2024-03-15 MED ORDER — CLOTRIMAZOLE 1 % EX LOTN: 1.0000 | TOPICAL_LOTION | Freq: Every day | CUTANEOUS | 2 refills | Status: DC | PRN

## 2024-03-15 NOTE — Progress Notes (Signed)
   Chief Complaint  Patient presents with   Ingrown Toenail    Pt is here to f/u on fight great toenail due to having ingrown removed on 6/13 pt states it feels like her toenail is loose, no other complaints.    Subjective: 53 y.o. female presents today status post permanent nail avulsion procedure of the medial border right great toe that was performed on 02/26/2024.  Patient doing well.  She did not pick up the prescription for the Lotrisone  cream or the topical Penlac .  She is requesting different medications..   Past Medical History:  Diagnosis Date   Allergy    Anemia    Anxiety    Arthritis    Asthma    Attention deficit hyperactivity disorder (ADHD), combined type 10/18/2015   Diagnosed many years ago.  Has been doing well on Ritalin  5mg  QID to six times a day. Long acting hasn't worked well in the past and she has not wanted to try Adderall. She is taking wellbutrin as well, but she has been weaning medication due to SE migraine.  She is now down to Wellbutrin one quarter tablet daily.  Last Assessment & Plan:  A/P: Stable.  Continue current dosing.  Refilled 3 months   Basal cell carcinoma 05/16/2021   Superficial, R post lower leg. [facility] [place], [place]   Basal cell carcinoma 09/03/2006   L chest, R forearm. 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  Skin is warm, dry and supple. Nail and respective nail fold appears to be healing appropriately.   Assessment: #1 s/p partial permanent nail matrixectomy medial border right great toe.  02/26/2024 #2 intermittent tinea pedis bilateral feet #3 onychomycosis of toenails bilateral    Plan of care: -Patient was evaluated  -Light debridement of the periungual debris was performed to the border of the respective toe and nail plate using a tissue nipper. -Prescription for clotrimazole  1% lotion apply daily PRN -Prescription for terbinafine 5% solution to apply to the nails daily PRN  -Patient is to return to clinic on a PRN basis.   Thresa EMERSON Sar, DPM [facility]  Dr. Thresa EMERSON Sar, DPM    [address] [place], [place] [address]                Office [phone]  Fax [phone]

## 2024-03-17 ENCOUNTER — Ambulatory Visit: Attending: Sports Medicine | Admitting: Occupational Therapy

## 2024-03-17 DIAGNOSIS — M79641 Pain in right hand: Secondary | ICD-10-CM | POA: Diagnosis present

## 2024-03-17 DIAGNOSIS — M25631 Stiffness of right wrist, not elsewhere classified: Secondary | ICD-10-CM | POA: Diagnosis present

## 2024-03-17 DIAGNOSIS — M6281 Muscle weakness (generalized): Secondary | ICD-10-CM | POA: Diagnosis present

## 2024-03-17 DIAGNOSIS — M25641 Stiffness of right hand, not elsewhere classified: Secondary | ICD-10-CM | POA: Insufficient documentation

## 2024-03-17 DIAGNOSIS — M25531 Pain in right wrist: Secondary | ICD-10-CM | POA: Insufficient documentation

## 2024-03-17 NOTE — Therapy (Signed)
 OUTPATIENT OCCUPATIONAL THERAPY ORTHO TREATMENT/RECERT  Patient Name: Autumn Zhang MRN: [id number] DOB:20-Mar-1971, 52 y.o., female Today's Date: 03/17/2024  PCP: Dr Myrla REFERRING PROVIDER: Dr Sharrie  END OF SESSION:  OT End of Session - 03/17/24 1030     Visit Number 10    Number of Visits 14    Date for OT Re-Evaluation 03/29/24    OT Start Time 1030    OT Stop Time 1118    OT Time Calculation (min) 48 min    Activity Tolerance Patient tolerated treatment well;Patient limited by pain    Behavior During Therapy [facility] for tasks assessed/performed          Past Medical History:  Diagnosis Date   Allergy    Anemia    Anxiety    Arthritis    Asthma    Attention deficit hyperactivity disorder (ADHD), combined type 10/18/2015   Diagnosed many years ago.  Has been doing well on Ritalin  5mg  QID to six times a day. Long acting hasn't worked well in the past and she has not wanted to try Adderall. She is taking wellbutrin as well, but she has been weaning medication due to SE migraine.  She is now down to Wellbutrin one quarter tablet daily.  Last Assessment & Plan:  A/P: Stable.  Continue current dosing.  Refilled 3 months   Basal cell carcinoma 05/16/2021   Superficial, R post lower leg. [facility] [place], [place]   Basal cell carcinoma 09/03/2006   L chest, R forearm. 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   Dysautonomia (HCC)    Dysplastic nevus 05/28/2009   right lateral back, [facility] - [place], [place]   Dysplastic nevus 01/13/2023   left lower calf, mod to severe, shave removal 03/16/23, margins free 03/16/23   Ehlers-Danlos syndrome type III    Family history of colon cancer    GERD (gastroesophageal reflux disease)    Hyperlipidemia    Idiopathic small fiber peripheral  neuropathy    Leiomyoma    Mast cell activation (HCC)    Migraines    Neck pain    Occipital neuralgia    Sleep apnea    Past Surgical History:  Procedure Laterality Date   APPENDECTOMY     removed with colectomy   BREAST SURGERY     reduction   COLON SURGERY  2008   for colon cancer, ~1/3 of colon removed, primary reanastimosis   COLONOSCOPY WITH PROPOFOL  N/A 10/07/2018   Procedure: COLONOSCOPY WITH PROPOFOL ;  Surgeon: Unk Corinn Skiff, MD;  Location: [facility] ENDOSCOPY;  Service: Gastroenterology;  Laterality: N/A;   COLONOSCOPY WITH PROPOFOL  N/A 08/14/2021   Procedure: COLONOSCOPY WITH PROPOFOL ;  Surgeon: Unk Corinn Skiff, MD;  Location: [facility] ENDOSCOPY;  Service: Gastroenterology;  Laterality: N/A;   ENDOMETRIAL ABLATION     trigeminal stimulator     Patient Active Problem List   Diagnosis Date Noted   Psoriatic arthritis (HCC) 12/21/2023   MTHFR mutation 12/21/2023   Lymphedema 04/28/2023   Facial swelling 04/13/2023   Liver cyst 06/24/2022   Decreased appetite 06/24/2022   Generalized abdominal pain 06/24/2022   Abnormal weight gain 02/03/2022   Central obesity 02/03/2022   Impaired fasting glucose 02/03/2022   Cervical spondylosis with radiculopathy 10/09/2021   Psoriasis 10/08/2021   Exocrine pancreatic  insufficiency 09/26/2021   Urinary frequency 09/26/2021   Atypical facial pain (1ry area of Pain) (Left) 08/29/2021   Chronic ear pain (Left) 08/29/2021   Temporal headache (Left) 08/29/2021   Inner ear pain (Left) 08/29/2021   Trigeminal nerve disorder (Left) 08/29/2021   History of photophobia 08/29/2021   TMJ (temporomandibular joint syndrome) 08/29/2021   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   Polypharmacy 08/29/2021   Severe frontal headaches 08/29/2021   Chronic occipital headache (Midline) (2ry area of Pain) 08/29/2021   Chronic neck pain (posterior) (4th area of Pain) (Bilateral) (L>R) 08/29/2021   Musculoskeletal disorder involving upper  trapezius muscle 08/29/2021   Musculoskeletal disorder involving sternocleidomastoid (Left) 08/29/2021   Abnormal MRI, cervical spine (02/11/2019) 08/29/2021   Shoulder stiffness (Right) 08/29/2021   Pharmacologic therapy 08/27/2021   Disorder of skeletal system 08/27/2021   Problems influencing health status 08/27/2021   GAD (generalized anxiety disorder) 07/17/2021   Difficulty walking 07/17/2021   Headache disorder (3ry area of Pain) 07/17/2021   Photophobia 07/17/2021   Difficulty with speech 07/17/2021   Tear film insufficiency 07/15/2021   Left lower quadrant abdominal pain 07/05/2021   Palpitations 06/11/2021   Axillary lymphadenopathy 06/11/2021   Immunosuppressed status (HCC) 04/19/2021   History of seronegative inflammatory arthritis 06/28/2020   Osteopenia of multiple sites 06/14/2020   Polyarthralgia 06/12/2020   Iodine deficiency 06/12/2020   OSA on CPAP 06/12/2020   Chronic pain syndrome 06/12/2020   Psychophysiological insomnia 11/26/2019   Calcific tendinitis of shoulder (Right) 04/10/2019   History of iron  deficiency anemia 11/04/2018   History of basal cell carcinoma 10/07/2018   Vaginal dryness 09/07/2018   Chronic jaw pain (Left) 07/15/2018   Chronic migraine with aura 07/15/2018   Ehlers-Danlos syndrome, type 3 07/15/2018   Rectocele 05/21/2018   Ehlers-Danlos syndrome 03/15/2018   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   Cardiac arrhythmia 01/21/2018   Polyp of colon 01/21/2018   Chronic constipation 01/21/2018   Instability of shoulder joint (Right) 01/12/2018   Trigeminal neuralgia (V1-3) (Left) 12/25/2017   Instability of shoulder joint (Left) 12/25/2017   Nausea 12/25/2017   Occipital neuralgia 12/25/2017   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   Hiatal hernia 10/14/2017   TMJ (dislocation of temporomandibular joint) 09/29/2017   Irritable bowel syndrome 07/02/2017   Anal fissure 07/02/2017   Anemia  07/02/2017   Gastroesophageal reflux disease 07/02/2017   Hemorrhoids 07/02/2017   Malignant neoplasm of skin 07/02/2017   Malignant tumor of colon (HCC) 07/02/2017   Arthritis 07/02/2017   Fibromyalgia 07/02/2017   Pulsatile tinnitus of ear (Left) 12/12/2016   Restless leg syndrome 08/01/2016   Cervical dystonia 03/21/2016   Cervical spine disease 03/21/2016   Cervicalgia 02/06/2016   Paresthesia 02/06/2016   Hypercholesterolemia 12/24/2015   Iron  deficiency anemia 10/18/2015   Vitamin D  deficiency disease 10/18/2015   Midline low back pain without sciatica 02/06/2015   Hyperglycemia 10/30/2014   History of colon cancer 09/27/2014   Postural orthostatic tachycardia syndrome 09/16/2003   Chronic fatigue 09/15/2000   Asthma 09/15/1988   Dyspnea 09/15/1988    ONSET DATE: 11/03/23  REFERRING DIAG: R wrist fx with numbness and pain   THERAPY DIAG:  Pain in right hand  Pain in right wrist  Stiffness of right hand, not elsewhere classified  Stiffness of right wrist, not elsewhere classified  Muscle weakness (generalized)  Rationale for Evaluation and Treatment: Rehabilitation  SUBJECTIVE:   SUBJECTIVE STATEMENT: The compression glove really feels good.  Around left  hand/ fingers. It feels little loose around the wrist- After we change the thumb splint last time it feels it rotates little bit.  I moved the Velcro a little bit.  I am using it mostly with my exercises.  My husband has been working on some soft tissue/ joint mobs at my wrist.  He feels this more space  pt accompanied by: self  PERTINENT HISTORY: 01/13/24 ortho note Dr Sharrie  I personally reviewed and visualized the imaging studies if available.  - EMG/NCS RUE = evaluate for any acute nerve injury from her distal radius fracture causing her reported symptoms  Assessment:   ICD-10-CM  1. Closed Colles' fracture of right radius, initial encounter S52.531A  2. Stiffness of right wrist joint M25.631  3.  Numbness and tingling in right hand R20.0  R20.2   Plan:   I have discussed the nature of her current subjective complaints, clinical examination, test results and have reviewed treatment options. The plan is to do the following;  - The patient has ongoing right wrist pain, swelling, decreased motion, hand numbness and tingling, difficulty with hand function after a distal radius fracture on 11/03/2023. She seems to have been treated appropriately with splint followed by cast followed by brace then referred to occupational therapy. Some of her symptoms could simply be due to ordinary sequela I of injury and recovery has joint stiffness could be prolonged for up to 6-12 months. She could be developing carpal tunnel syndrome which can occur with a distal radius fracture as well. I am unsure about her ulnar sided fingers issue? This could be some nerve injury versus biomechanical abnormality because of significant loss to wrist motion versus developing complex regional pain syndrome. - She has had recent x-rays that show healing to her nondisplaced, extra-articular distal radius fracture. There is some osteopenia surrounding the joint which could be from disuse/immobilization. She has significant decreased her wrist motion. Pain limits a lot of examination. I discussed the normal recovery after distal radius fracture and how some of the things she is feeling can occur. Because of the complexity of her past medical history and her reported issues that seem neurologic, I recommended EMG ASAP to evaluate for any nerve damage. I also referred her to a different occupational therapist to see if they can provide any different treatments. Depending on the EMG results, she may need referral to neurosurgery versus hand surgery specialist versus general orthopedic surgeon. - Daily activity as tolerated. Modify activity as needed according to symptoms. No limitations to pain-free weight bearing. No need for immobilization  or bracing. - Refer to OT for pain relieving modalities, manual techniques as indicated, home exercise planning. Continue home exercise program to maintain strength, flexibility, and endurance. - Use chronic medications, over-the-counter medicines, topical diclofenac /pain cream, relative rest, compression, massage, and ice/heat as needed for pain.  - Follow up depending on EMG results. I did discuss that we could consider carpal tunnel steroid injection as a possible treatment.   PRECAUTIONS: none     WEIGHT BEARING RESTRICTIONS: No  PAIN:  Are you having pain?  5/10 over the pisiform and ulnar wrist to touch or motion FALLS: Has patient fallen in last 6 months? ?  LIVING ENVIRONMENT: Lives with: lives with their spouse    PLOF: on disability -patient occupation was International aid/development worker.  Because of her medical history she does not participate in cooking and laundry and cleaning.  But is active around the house.  Likes to walk.  On the computer  and phone. Patient did prior to injury had pain at the thumb.  And at the carpal bones at times.  Appear active range of motion was within functional limits   PATIENT GOALS: I want the pain better my right hand and wrist and get my motion and strength back so I can use my hand  NEXT MD VISIT: ?  OBJECTIVE:  Note: Objective measures were completed at Evaluation unless otherwise noted.  HAND DOMINANCE: Right  ADLs: Severe difficulty using right hand. Unable to grip, pull or push or hold or carry.  Limited by severe wrist stiffness.   FUNCTIONAL OUTCOME MEASURES: Next session  UPPER EXTREMITY ROM:     Active ROM Right eval Left eval R 02/11/24 R 02/15/24 R 02/23/24 R 03/07/24 R 03/10/24 R 03/17/24  Shoulder flexion          Shoulder abduction          Shoulder adduction          Shoulder extension          Shoulder internal rotation          Shoulder external rotation          Elbow flexion          Elbow extension          Wrist flexion  25  35 40 50 55    Wrist extension 5  10 20 15  close fist 30 20 15  open hand, 0 close fist  10 close fist , open hand 20  Wrist ulnar deviation 10  15 18  20     Wrist radial deviation 5  8 12  10     Wrist pronation 90  90 90 90   90  Wrist supination 63  60 60 70 70  65  (Blank rows = not tested)  Active ROM Right eval Left eval R 02/11/24 R 02/15/24 R 02/23/24  Thumb MCP (0-60) 50    55  Thumb IP (0-80) 50    50  Thumb Radial abd/add (0-55) 45    45   Thumb Palmar abd/add (0-45) 50    50   Thumb Opposition to Small Finger Opposition to all digits but increased pain 3rd through 5th    Opposition to all digits   Index MCP (0-90) 65   70 65 75  Index PIP (0-100) 80   90 95   Index DIP (0-70)         Long MCP (0-90)  70   75 85   Long PIP (0-100)  90   95 95   Long DIP (0-70)         Ring MCP (0-90)  70   80 90   Ring PIP (0-100)  90   95 100   Ring DIP (0-70)         Little MCP (0-90)  75   90 95   Little PIP (0-100)  90   90 100   Little DIP (0-70)         (Blank rows = not tested)  HAND FUNCTION:  02/23/24  Grip strength: Right: 8 lbs; Left: 39 lbs, Lateral pinch: Right: 1 lbs, Left: 11 lbs, and 3 point pinch: Right: 2 lbs, Left: 11 lbs if providing stability at thumb CMC had 7 lbs lat pinch and 4 3 point pinch  02/29/24 Grip strength: Right: 8 lbs; Left: 39 lbs, Lateral pinch: Right: 5 lbs, Left: 11 lbs, and 3 point pinch: Right: 3  lbs, Left: 11 lbs 03/07/24 Grip strength: NT  Lateral pinch: Right: 6 oval 8 custom lbs, Left: 11 lbs, and 3 point pinch: Right: 4 Oval 8 custom lbs, Left: 11 lbs  03/10/24 Grip strength: R 17 with oval 8 at thumb , L 41lbs  Lateral pinch: Right: 8 oval 8 custom lbs, Left: 11 lbs, and 3 point pinch: Right: 5 Oval 8 custom lbs, Left: 11 lbs 03/17/24 Grip strength: R10 with oval 8 at thumb pain palm , L 41lbs  Lateral pinch: Right: 10 oval 8 custom lbs, Left: 11 lbs, and 3 point pinch: Right: 6 Oval 8 custom lbs, Left: 11 lbs COORDINATION: Impaired because of  severe stiffness in right hand digits and wrist   SENSATION: Patient report nerve conduction showed minimal carpal tunnel. Will further assess   NO denies numbness or sensory issue    EDEMA: Appeared to have some edema in the digits as well as the hand.  COGNITION: Overall cognitive status: Within functional limits for tasks assessed     TREATMENT DATE: 03/17/24    Patient  has appointment with Dr. Sharrie  7 July. Patient report pain at wrist and pisiform better than last week Reinforced with patient the importance of maintaining wrist motion Especially wrist extension.  Patient compensating with long extensors. Since last visit - improve 10 degrees of ext close fist  Husband had been doing some soft tissue and joint mobs to wrist - pt allow him to do some  Reviewed with patient and husband soft tissue mobs to the Ulnar and radial , carpals ,the metacarpals what the patient can tolerate and allow him to do.  In combination with the light facilitation of wrist extension with a loose fist. Attempted soft tissue to thumb webspace.  Patient feels instability at the lateral MCP of the thumb.  Very guarded. Discussed with patient and husband allowing him to work into the webspace. And to focus on thumb palmar abduction-patient compensates into radial abduction  Patient arrived with hand-based lateral support oval 8 splint for thumb custom fabricated end of last week.  Providing  stability at the proximal phalanges of the thumb with middle phalanges volar support. Assessment with splint on 3 point and lateral pinch this week-great progress less pain more stability  see flowsheet Encourage patient to hold off on prehension strength with putty and focus on functional strengthening Grip can do light putty with splint on. Was fitted with extra small Isotoner glove to provide compression over the metacarpals hand as well as wrist.  To decrease edema pain. Patient again for the second time  this week had increased lateral and 3 point pinch with custom  oval 8 splint on Patient did need again modifications to the proximal part of oval 8.  Changed triangle but also reinforced with patient Velcro has to be at the tip with strap around the wrist to secure it and keep it in place.  Moleskin applied again.  To decrease sliding    Modalities: Done fluidotherapy today 8 minutes performing thumb palmar radial abduction, flexion of digits as well as wrist supination and flexion extension.   Also discussed with patient passive range of motion to thumb IP and can initiate thumb on fingers and fingers on thumb opposition of large object out of palm to 2nd and 3rd, and from palm to 4th and 5th last visit  Also modification for patient for thumb radial abduction patient can do stabilization of thumb waving wrist into ulnar deviation away from thumb  for another option Can also initiate isometric strengthening for radial abduction of the thumb. Was both pain-free   .                                                                                                                              PATIENT EDUCATION: Education details: findings of eval and HEP  Person educated: Patient Education method: Explanation, Demonstration, Tactile cues, Verbal cues, and Handouts Education comprehension: verbalized understanding, returned demonstration, verbal cues required, and needs further education    GOALS: Goals reviewed with patient? Yes   LONG TERM GOALS: Target date: 8 wks  Patient and husband to be independent in home program for patient to decrease resting hand pain to less than 5/10. Baseline: No knowledge about splinting or modalities and home exercise program Goal status: Met  2.  Right digits  AROM improve for patient to be able to touch palm Baseline: MC flexion 65 to 75 degrees PIP flexion 80 to 90 degrees with increased pain and increase edema Goal status: Met  3.  Patient pain  decreased to less than 3/10 at rest for patient t at wrist to initiate PROM and AAROM  range of motion to wrist flexion extension Baseline: Wrist extension 5 degrees flexion 25.  Pain 2-9/10 at rest  NOW resting pain improved.  But patient cannot tolerate wrist extension as well as supination.  And radial ulnar deviation.  Increased pain at the pisiform I as well as volar wrist Goal status: Progressing  4.  R thumb AROM increase to [facility] for pt to do buttons, write, hold cup  Baseline: Right thumb palmar radial abduction 45 to 50 degrees with pain.  Unable to maintain position.  Wearing tape to support thumb into adduction at base.  Opposition pain to 3rd through 5th. NOW active range of motion for thumb improving in opposition as well as palmar radial abduction.  Do feel some instability with radial and palmar abduction.  As well as flexion with pressure.  She use a custom oval 8 on thumb for lateral stabilization Goal status: Progressing  5.  Right forearm improved for patient to turn doorknobs symptoms.  Baseline: Supination 63 degrees unable to use forearm NOW limited progress because of increased pain with attempts of range of motion for supination at ulnar wrist and pisiform Goal status: Progressing  6.  Right wrist active and improved for patient to perform 50% of ADLs Baseline: Wrist severely limited radial deviation5 /ulnar deviation 10; extension 5 degrees of flexion 25 with pain NOW patient may progress in wrist flexion as well as radial ulnar deviation.  Limited in progress in wrist extension and supination by pain Goal status: Progressing  ASSESSMENT:  CLINICAL IMPRESSION: Patient seen  for occupational therapy  for right Colles' fracture on 11/03/23.  Patient had a cast t presented in a prefab splint because a lot of pain.  Patient had  few OT sessions but progress was limited by  pain.  Patient referred by Dr. Sharrie with severe stiffness in right dominant hand and wrist as well as  pain and numbness.  Patient has history of Ehlers-Denlos syndrome-per patient nerve conduction test showed minimal carpal tunnel.  Patient present at OT evaluation with severe stiffness in wrist in all planes.  As well as pain in the thumb and digits.  Patient reports prior to injury patient active range of motion in right wrist and hand was within functional limits did had some pain in thumb and carpal bones.  Patient hypersensitive and tender to touch as well as soft tissue mobilization.  Unable to tolerate.   NOW patient seen today for 10th visit.  Since start of care patient made great progress in digits flexion as well as thumb palmar /radial abduction.  As well as opposition. Pt wearing custom oval 8 splint fabricated by OT with a lateral support on the thumb since last week.-Needed twice modifications but patient with I do not have great progress in lateral and 3 point pinch.  More stability laterally on the thumb with loading. Patient to focus on functional strengthening for prevention and can do light blue putty for gripping.  Patient made great progress in wrist flexion as well as radial ulnar deviation.  But limited in progress for wrist extension and supination because of pain at the wrist.  Tenderness.  Contacted Dr. Sharrie.  Patient to see him on- 7th July-  At this time limited in pain and edema over the ulnar wrist ,the pisiform with supination and wrist extension as well as strengthening.  Patient to focus on maintaining wrist range of motion including forearm -extension of wrist with a loose fist not compensating with long extensors.    Patient continues to have tenderness and discomfort and pain at the ulnar wrist and pisiform.   Patient to use modalities to decrease pain, edema -and increased motion in combination with splint use and medication for pain.  Patient can benefit from skilled OT services to decrease pain and edema and increase motion and strength to return to prior level of  function.  PERFORMANCE DEFICITS: in functional skills including ADLs, IADLs, ROM, strength, pain, flexibility, decreased knowledge of use of DME, and UE functional use,   and psychosocial skills including environmental adaptation and routines and behaviors.   IMPAIRMENTS: are limiting patient from ADLs, IADLs, rest and sleep, play, leisure, and social participation.   COMORBIDITIES: has no other co-morbidities that affects occupational performance. Patient will benefit from skilled OT to address above impairments and improve overall function.  MODIFICATION OR ASSISTANCE TO COMPLETE EVALUATION: No modification of tasks or assist necessary to complete an evaluation.  OT OCCUPATIONAL PROFILE AND HISTORY: Problem focused assessment: Including review of records relating to presenting problem.  CLINICAL DECISION MAKING: LOW - limited treatment options, no task modification necessary  REHAB POTENTIAL: Good for goals  EVALUATION COMPLEXITY: Low     PLAN:  OT FREQUENCY: 1-2x/week  OT DURATION: 8 weeks  PLANNED INTERVENTIONS: 97168 OT Re-evaluation, 97535 self care/ADL training, 02889 therapeutic exercise, 97530 therapeutic activity, 97140 manual therapy, 97018 paraffin, 02960 fluidotherapy, 97034 contrast bath, 97033 iontophoresis, passive range of motion, patient/family education, and DME and/or AE instructions    CONSULTED AND AGREED WITH PLAN OF CARE: Patient     Ancel Peters, OTR/L,CLT 03/17/2024, 2:32 PM

## 2024-03-21 ENCOUNTER — Encounter: Admitting: Occupational Therapy

## 2024-03-22 ENCOUNTER — Ambulatory Visit: Admitting: Occupational Therapy

## 2024-03-24 ENCOUNTER — Encounter: Admitting: Occupational Therapy

## 2024-03-28 ENCOUNTER — Ambulatory Visit: Admitting: Occupational Therapy

## 2024-03-28 ENCOUNTER — Telehealth: Payer: Self-pay

## 2024-03-28 NOTE — Telephone Encounter (Signed)
 Telephone call to patient to schedule new patient appointment. Patient states that appointment is no longer needed.

## 2024-03-29 ENCOUNTER — Ambulatory Visit: Admitting: Podiatry

## 2024-04-05 ENCOUNTER — Ambulatory Visit: Admitting: Occupational Therapy

## 2024-04-05 DIAGNOSIS — M25531 Pain in right wrist: Secondary | ICD-10-CM

## 2024-04-05 DIAGNOSIS — M25641 Stiffness of right hand, not elsewhere classified: Secondary | ICD-10-CM

## 2024-04-05 DIAGNOSIS — M6281 Muscle weakness (generalized): Secondary | ICD-10-CM

## 2024-04-05 DIAGNOSIS — M79641 Pain in right hand: Secondary | ICD-10-CM

## 2024-04-05 DIAGNOSIS — M25631 Stiffness of right wrist, not elsewhere classified: Secondary | ICD-10-CM

## 2024-04-05 NOTE — Therapy (Signed)
 OUTPATIENT OCCUPATIONAL THERAPY ORTHO TREATMENT/RECERT  Patient Name: Autumn Zhang MRN: [id number] DOB:Jan 12, 1971, 53 y.o., female Today's Date: 04/05/2024  PCP: Dr Myrla REFERRING PROVIDER: Dr Sharrie  END OF SESSION:  OT End of Session - 04/05/24 1031     Visit Number 11    Number of Visits 20    Date for OT Re-Evaluation 06/14/24    OT Start Time 1032    OT Stop Time 1116    OT Time Calculation (min) 44 min    Activity Tolerance Patient tolerated treatment well;Patient limited by pain    Behavior During Therapy [facility] for tasks assessed/performed          Past Medical History:  Diagnosis Date   Allergy    Anemia    Anxiety    Arthritis    Asthma    Attention deficit hyperactivity disorder (ADHD), combined type 10/18/2015   Diagnosed many years ago.  Has been doing well on Ritalin  5mg  QID to six times a day. Long acting hasn't worked well in the past and she has not wanted to try Adderall. She is taking wellbutrin as well, but she has been weaning medication due to SE migraine.  She is now down to Wellbutrin one quarter tablet daily.  Last Assessment & Plan:  A/P: Stable.  Continue current dosing.  Refilled 3 months   Basal cell carcinoma 05/16/2021   Superficial, R post lower leg. [facility] [place], [place]   Basal cell carcinoma 09/03/2006   L chest, R forearm. 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   Dysautonomia (HCC)    Dysplastic nevus 05/28/2009   right lateral back, [facility] [place], [place]   Dysplastic nevus 01/13/2023   left lower calf, mod to severe, shave removal 03/16/23, margins free 03/16/23   Ehlers-Danlos syndrome type III    Family history of colon cancer    GERD (gastroesophageal reflux disease)    Hyperlipidemia    Idiopathic small fiber  peripheral neuropathy    Leiomyoma    Mast cell activation (HCC)    Migraines    Neck pain    Occipital neuralgia    Sleep apnea    Past Surgical History:  Procedure Laterality Date   APPENDECTOMY     removed with colectomy   BREAST SURGERY     reduction   COLON SURGERY  2008   for colon cancer, ~1/3 of colon removed, primary reanastimosis   COLONOSCOPY WITH PROPOFOL  N/A 10/07/2018   Procedure: COLONOSCOPY WITH PROPOFOL ;  Surgeon: Unk Corinn Skiff, MD;  Location: [facility] ENDOSCOPY;  Service: Gastroenterology;  Laterality: N/A;   COLONOSCOPY WITH PROPOFOL  N/A 08/14/2021   Procedure: COLONOSCOPY WITH PROPOFOL ;  Surgeon: Unk Corinn Skiff, MD;  Location: [facility] ENDOSCOPY;  Service: Gastroenterology;  Laterality: N/A;   ENDOMETRIAL ABLATION     trigeminal stimulator     Patient Active Problem List   Diagnosis Date Noted   Psoriatic arthritis (HCC) 12/21/2023   MTHFR mutation 12/21/2023   Lymphedema 04/28/2023   Facial swelling 04/13/2023   Liver cyst 06/24/2022   Decreased appetite 06/24/2022   Generalized abdominal pain 06/24/2022   Abnormal weight gain 02/03/2022   Central obesity 02/03/2022   Impaired fasting glucose 02/03/2022   Cervical spondylosis with radiculopathy 10/09/2021   Psoriasis 10/08/2021   Exocrine pancreatic  insufficiency 09/26/2021   Urinary frequency 09/26/2021   Atypical facial pain (1ry area of Pain) (Left) 08/29/2021   Chronic ear pain (Left) 08/29/2021   Temporal headache (Left) 08/29/2021   Inner ear pain (Left) 08/29/2021   Trigeminal nerve disorder (Left) 08/29/2021   History of photophobia 08/29/2021   TMJ (temporomandibular joint syndrome) 08/29/2021   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   Polypharmacy 08/29/2021   Severe frontal headaches 08/29/2021   Chronic occipital headache (Midline) (2ry area of Pain) 08/29/2021   Chronic neck pain (posterior) (4th area of Pain) (Bilateral) (L>R) 08/29/2021   Musculoskeletal disorder  involving upper trapezius muscle 08/29/2021   Musculoskeletal disorder involving sternocleidomastoid (Left) 08/29/2021   Abnormal MRI, cervical spine (02/11/2019) 08/29/2021   Shoulder stiffness (Right) 08/29/2021   Pharmacologic therapy 08/27/2021   Disorder of skeletal system 08/27/2021   Problems influencing health status 08/27/2021   GAD (generalized anxiety disorder) 07/17/2021   Difficulty walking 07/17/2021   Headache disorder (3ry area of Pain) 07/17/2021   Photophobia 07/17/2021   Difficulty with speech 07/17/2021   Tear film insufficiency 07/15/2021   Left lower quadrant abdominal pain 07/05/2021   Palpitations 06/11/2021   Axillary lymphadenopathy 06/11/2021   Immunosuppressed status (HCC) 04/19/2021   History of seronegative inflammatory arthritis 06/28/2020   Osteopenia of multiple sites 06/14/2020   Polyarthralgia 06/12/2020   Iodine deficiency 06/12/2020   OSA on CPAP 06/12/2020   Chronic pain syndrome 06/12/2020   Psychophysiological insomnia 11/26/2019   Calcific tendinitis of shoulder (Right) 04/10/2019   History of iron  deficiency anemia 11/04/2018   History of basal cell carcinoma 10/07/2018   Vaginal dryness 09/07/2018   Chronic jaw pain (Left) 07/15/2018   Chronic migraine with aura 07/15/2018   Ehlers-Danlos syndrome, type 3 07/15/2018   Rectocele 05/21/2018   Ehlers-Danlos syndrome 03/15/2018   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   Cardiac arrhythmia 01/21/2018   Polyp of colon 01/21/2018   Chronic constipation 01/21/2018   Instability of shoulder joint (Right) 01/12/2018   Trigeminal neuralgia (V1-3) (Left) 12/25/2017   Instability of shoulder joint (Left) 12/25/2017   Nausea 12/25/2017   Occipital neuralgia 12/25/2017   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   Hiatal hernia 10/14/2017   TMJ (dislocation of temporomandibular joint) 09/29/2017   Irritable bowel syndrome 07/02/2017   Anal fissure  07/02/2017   Anemia 07/02/2017   Gastroesophageal reflux disease 07/02/2017   Hemorrhoids 07/02/2017   Malignant neoplasm of skin 07/02/2017   Malignant tumor of colon (HCC) 07/02/2017   Arthritis 07/02/2017   Fibromyalgia 07/02/2017   Pulsatile tinnitus of ear (Left) 12/12/2016   Restless leg syndrome 08/01/2016   Cervical dystonia 03/21/2016   Cervical spine disease 03/21/2016   Cervicalgia 02/06/2016   Paresthesia 02/06/2016   Hypercholesterolemia 12/24/2015   Iron  deficiency anemia 10/18/2015   Vitamin D  deficiency disease 10/18/2015   Midline low back pain without sciatica 02/06/2015   Hyperglycemia 10/30/2014   History of colon cancer 09/27/2014   Postural orthostatic tachycardia syndrome 09/16/2003   Chronic fatigue 09/15/2000   Asthma 09/15/1988   Dyspnea 09/15/1988    ONSET DATE: 11/03/23  REFERRING DIAG: R wrist fx with numbness and pain   THERAPY DIAG:  Pain in right hand  Pain in right wrist  Stiffness of right hand, not elsewhere classified  Stiffness of right wrist, not elsewhere classified  Muscle weakness (generalized)  Rationale for Evaluation and Treatment: Rehabilitation  SUBJECTIVE:   SUBJECTIVE STATEMENT: I did see Dr. Sharrie on 7 July.  Had a shot at the top of my wrist.  It feels like I have a little bit more mobility in that part of the wrist.  In my supination.  But that pain at the pisiform is still there and the feeling of instability.  And then also at the base of my thumb. pt accompanied by: self  PERTINENT HISTORY: 01/13/24 ortho note Dr Sharrie  I personally reviewed and visualized the imaging studies if available.  - EMG/NCS RUE = evaluate for any acute nerve injury from her distal radius fracture causing her reported symptoms  Assessment:   ICD-10-CM  1. Closed Colles' fracture of right radius, initial encounter S52.531A  2. Stiffness of right wrist joint M25.631  3. Numbness and tingling in right hand R20.0  R20.2    Plan:   I have discussed the nature of her current subjective complaints, clinical examination, test results and have reviewed treatment options. The plan is to do the following;  - The patient has ongoing right wrist pain, swelling, decreased motion, hand numbness and tingling, difficulty with hand function after a distal radius fracture on 11/03/2023. She seems to have been treated appropriately with splint followed by cast followed by brace then referred to occupational therapy. Some of her symptoms could simply be due to ordinary sequela I of injury and recovery has joint stiffness could be prolonged for up to 6-12 months. She could be developing carpal tunnel syndrome which can occur with a distal radius fracture as well. I am unsure about her ulnar sided fingers issue? This could be some nerve injury versus biomechanical abnormality because of significant loss to wrist motion versus developing complex regional pain syndrome. - She has had recent x-rays that show healing to her nondisplaced, extra-articular distal radius fracture. There is some osteopenia surrounding the joint which could be from disuse/immobilization. She has significant decreased her wrist motion. Pain limits a lot of examination. I discussed the normal recovery after distal radius fracture and how some of the things she is feeling can occur. Because of the complexity of her past medical history and her reported issues that seem neurologic, I recommended EMG ASAP to evaluate for any nerve damage. I also referred her to a different occupational therapist to see if they can provide any different treatments. Depending on the EMG results, she may need referral to neurosurgery versus hand surgery specialist versus general orthopedic surgeon. - Daily activity as tolerated. Modify activity as needed according to symptoms. No limitations to pain-free weight bearing. No need for immobilization or bracing. - Refer to OT for pain relieving  modalities, manual techniques as indicated, home exercise planning. Continue home exercise program to maintain strength, flexibility, and endurance. - Use chronic medications, over-the-counter medicines, topical diclofenac /pain cream, relative rest, compression, massage, and ice/heat as needed for pain.  - Follow up depending on EMG results. I did discuss that we could consider carpal tunnel steroid injection as a possible treatment.   PRECAUTIONS: none     WEIGHT BEARING RESTRICTIONS: No  PAIN:  Are you having pain?  6-7/10 over the pisiform and radial wrist during session FALLS: Has patient fallen in last 6 months? ?  LIVING ENVIRONMENT: Lives with: lives with their spouse    PLOF: on disability -patient occupation was International aid/development worker.  Because of her medical history she does not participate in cooking and laundry and cleaning.  But is active around the house.  Likes to walk.  On the computer and phone. Patient did prior to injury had pain  at the thumb.  And at the carpal bones at times.  Appear active range of motion was within functional limits   PATIENT GOALS: I want the pain better my right hand and wrist and get my motion and strength back so I can use my hand  NEXT MD VISIT: ?  OBJECTIVE:  Note: Objective measures were completed at Evaluation unless otherwise noted.  HAND DOMINANCE: Right  ADLs: Severe difficulty using right hand. Unable to grip, pull or push or hold or carry.  Limited by severe wrist stiffness.   FUNCTIONAL OUTCOME MEASURES: Next session  UPPER EXTREMITY ROM:     Active ROM Right eval Left eval R 02/11/24 R 02/15/24 R 02/23/24 R 03/07/24 R 03/10/24 R 03/17/24 R 04/05/24  Shoulder flexion           Shoulder abduction           Shoulder adduction           Shoulder extension           Shoulder internal rotation           Shoulder external rotation           Elbow flexion           Elbow extension           Wrist flexion 25  35 40 50 55   60 close  fist 55  Wrist extension 5  10 20 15  close fist 30 20 15  open hand, 0 close fist  10 close fist , open hand 20 15 close fist and 20 open hand  Wrist ulnar deviation 10  15 18  20   25   Wrist radial deviation 5  8 12  10   18   Wrist pronation 90  90 90 90   90   Wrist supination 63  60 60 70 70  65 78  (Blank rows = not tested)  Active ROM Right eval Left eval R 02/11/24 R 02/15/24 R 02/23/24 R 04/05/24  Thumb MCP (0-60) 50    55 55  Thumb IP (0-80) 50    50 65  Thumb Radial abd/add (0-55) 45    45  48  Thumb Palmar abd/add (0-45) 50    50  50  Thumb Opposition to Small Finger Opposition to all digits but increased pain 3rd through 5th    Opposition to all digits    Index MCP (0-90) 65   70 65 75   Index PIP (0-100) 80   90 95    Index DIP (0-70)          Long MCP (0-90)  70   75 85    Long PIP (0-100)  90   95 95    Long DIP (0-70)          Ring MCP (0-90)  70   80 90    Ring PIP (0-100)  90   95 100    Ring DIP (0-70)          Little MCP (0-90)  75   90 95    Little PIP (0-100)  90   90 100    Little DIP (0-70)          (Blank rows = not tested)  HAND FUNCTION:  02/23/24  Grip strength: Right: 8 lbs; Left: 39 lbs, Lateral pinch: Right: 1 lbs, Left: 11 lbs, and 3 point pinch: Right: 2 lbs, Left: 11 lbs if providing stability at thumb  CMC had 7 lbs lat pinch and 4 3 point pinch  02/29/24 Grip strength: Right: 8 lbs; Left: 39 lbs, Lateral pinch: Right: 5 lbs, Left: 11 lbs, and 3 point pinch: Right: 3 lbs, Left: 11 lbs 03/07/24 Grip strength: NT  Lateral pinch: Right: 6 oval 8 custom lbs, Left: 11 lbs, and 3 point pinch: Right: 4 Oval 8 custom lbs, Left: 11 lbs  03/10/24 Grip strength: R 17 with oval 8 at thumb , L 41lbs  Lateral pinch: Right: 8 oval 8 custom lbs, Left: 11 lbs, and 3 point pinch: Right: 5 Oval 8 custom lbs, Left: 11 lbs 03/17/24 Grip strength: R10 with oval 8 at thumb pain palm , L 41lbs  Lateral pinch: Right: 10 oval 8 custom lbs, Left: 11 lbs, and 3 point pinch: Right: 6  Oval 8 custom lbs, Left: 11 lbs 04/05/24 Grip strength: R10 with oval 8 at thumb pain palm , L 41lbs  Lateral pinch: Right: 8 oval 8 custom lbs, Left: 9 lbs, and 3 point pinch: Right: 6 Oval 8 custom lbs, Left: 11 lbs  COORDINATION: Impaired because of severe stiffness in right hand digits and wrist   SENSATION: Patient report nerve conduction showed minimal carpal tunnel. Will further assess   NO denies numbness or sensory issue    EDEMA: Appeared to have some edema in the digits as well as the hand.  COGNITION: Overall cognitive status: Within functional limits for tasks assessed     TREATMENT DATE: 04/05/24    Patient  had appointment with Dr. Sharrie  7 July. Had a shot to the distal radius carpal joint Patient report increased flexibility at the wrist but continues pain at the pisiform I as well as achiness at the base of the thumb. Measurements taken for range of motion at wrist in all planes.  Supination improved greatly wrist flexion i improved 5 degrees-open and composite about the same. Radial ulnar deviation improved see flowsheet Wrist extension but the same. Thumb radial abduction improved 3 degrees IP flexion of the thumb improved See flowsheet After assessment of range of motion patient had a little bit increased pain at the pisiform as well as the radial wrist. Limiting her measurements with her 3 point and lateral pinch as well as grip.  See flowsheet  Assess initiation of isometric strengthening to ulnar radial deviation at neutral 10 reps as well as flexion extension at neutral.  Patient tolerating well neutral position but not endrange of radial ulnar deviation. Also attempted thumb palmar abduction with 45 degree doing isometric strengthening for palmar radial abduction as well as flexion extension 10 reps. Patient had a little increase discomfort today and pain but could be because of measurements taken prior.      Modalities: Done fluidotherapy today  8 minutes performing thumb palmar radial abduction, flexion of digits as well as wrist supination and flexion extension.   Attempted CPM on BTE for wrist motion.  Patient was not able to tolerate positioning at the pisiform eye. Could be because of irritation during the session with measurements and isometric strengthening.  Will reattempt next visit after fluidotherapy again.  SABRA  PATIENT EDUCATION: Education details: findings of eval and HEP  Person educated: Patient Education method: Explanation, Demonstration, Tactile cues, Verbal cues, and Handouts Education comprehension: verbalized understanding, returned demonstration, verbal cues required, and needs further education    GOALS: Goals reviewed with patient? Yes   LONG TERM GOALS: Target date: 8 wks  Patient and husband to be independent in home program for patient to decrease resting hand pain to less than 5/10. Baseline: No knowledge about splinting or modalities and home exercise program Goal status: Met  2.  Right digits  AROM improve for patient to be able to touch palm Baseline: MC flexion 65 to 75 degrees PIP flexion 80 to 90 degrees with increased pain and increase edema Goal status: Met  3.  Patient pain decreased to less than 3/10 at rest for patient t at wrist to initiate PROM and AAROM  range of motion to wrist flexion extension Baseline: Wrist extension 5 degrees flexion 25.  Pain 2-9/10 at rest  NOW resting pain improved.  But patient cannot tolerate wrist extension as well as supination.  And radial ulnar deviation.  Increased pain at the pisiform I as well as volar wrist Goal status: Progressing  4.  R thumb AROM increase to [facility] for pt to do buttons, write, hold cup  Baseline: Right thumb palmar radial abduction 45 to 50 degrees with pain.  Unable to maintain position.  Wearing tape to  support thumb into adduction at base.  Opposition pain to 3rd through 5th. NOW active range of motion for thumb improving in opposition as well as palmar radial abduction.  Do feel some instability with radial and palmar abduction.  As well as flexion with pressure.  She use a custom oval 8 on thumb for lateral stabilization Goal status: Progressing  5.  Right forearm improved for patient to turn doorknobs symptoms.  Baseline: Supination 63 degrees unable to use forearm NOW limited progress because of increased pain with attempts of range of motion for supination at ulnar wrist and pisiform Goal status: Progressing  6.  Right wrist active and improved for patient to perform 50% of ADLs Baseline: Wrist severely limited radial deviation5 /ulnar deviation 10; extension 5 degrees of flexion 25 with pain NOW patient may progress in wrist flexion as well as radial ulnar deviation.  Limited in progress in wrist extension and supination by pain Goal status: Progressing  ASSESSMENT:  CLINICAL IMPRESSION: Patient seen  for occupational therapy  for right Colles' fracture on 11/03/23.  Patient had a cast t presented in a prefab splint because a lot of pain.  Patient had  few OT sessions but progress was limited by pain.  Patient referred by Dr. Sharrie with severe stiffness in right dominant hand and wrist as well as pain and numbness.  Patient has history of Ehlers-Denlos syndrome-per patient nerve conduction test showed minimal carpal tunnel.  Patient present at OT evaluation with severe stiffness in wrist in all planes.  As well as pain in the thumb and digits.  Patient reports prior to injury patient active range of motion in right wrist and hand was within functional limits did had some pain in thumb and carpal bones.  Patient hypersensitive and tender to touch as well as soft tissue mobilization.  Unable to tolerate.  NOW- since start of care patient made great progress in digits flexion as well as thumb  palmar /radial abduction.  As well as opposition. Pt wearing custom oval 8 splint fabricated by OT with a lateral  support on the thumb .-Needed twice modifications but patient with splint on do have great progress in lateral and 3 point pinch.  Provide stability laterally on the thumb with loading. Patient to focus on functional strengthening for prevention and can do light blue putty for gripping.  Patient made great progress in wrist flexion as well as radial ulnar deviation.  But limited in progress for wrist extension -patient had appointment with Dr. Sharrie on 7th July-had a shot at the distal radius carpal joint.  Patient arrived today with increased supination as well as radial ulnar deviation.  But continues pain over the pisiform .  As well as some soreness pain at base of thumb.  Attempted to initiate isometric strengthening today to the wrist and thumb.  Patient had increased discomfort and pain because of reassessment.  Will reattempt next session after fluidotherapy.  Focus on wrist extension progression strengthening isometric at the wrist and thumb.  Patient to use modalities to decrease pain, edema -and increased motion in combination with splint use and medication for pain.  Patient can benefit from skilled OT services to decrease pain and edema and increase motion and strength to return to prior level of function.  PERFORMANCE DEFICITS: in functional skills including ADLs, IADLs, ROM, strength, pain, flexibility, decreased knowledge of use of DME, and UE functional use,   and psychosocial skills including environmental adaptation and routines and behaviors.   IMPAIRMENTS: are limiting patient from ADLs, IADLs, rest and sleep, play, leisure, and social participation.   COMORBIDITIES: has no other co-morbidities that affects occupational performance. Patient will benefit from skilled OT to address above impairments and improve overall function.  MODIFICATION OR ASSISTANCE TO COMPLETE  EVALUATION: No modification of tasks or assist necessary to complete an evaluation.  OT OCCUPATIONAL PROFILE AND HISTORY: Problem focused assessment: Including review of records relating to presenting problem.  CLINICAL DECISION MAKING: LOW - limited treatment options, no task modification necessary  REHAB POTENTIAL: Good for goals  EVALUATION COMPLEXITY: Low     PLAN:  OT FREQUENCY: 1-2x/week  OT DURATION: 10 wks  PLANNED INTERVENTIONS: 97168 OT Re-evaluation, 97535 self care/ADL training, 02889 therapeutic exercise, 97530 therapeutic activity, 97140 manual therapy, 97018 paraffin, 02960 fluidotherapy, 97034 contrast bath, 97033 iontophoresis, passive range of motion, patient/family education, and DME and/or AE instructions    CONSULTED AND AGREED WITH PLAN OF CARE: Patient     Ancel Peters, OTR/L,CLT 04/05/2024, 4:48 PM

## 2024-04-07 ENCOUNTER — Ambulatory Visit: Admitting: Occupational Therapy

## 2024-04-07 DIAGNOSIS — M6281 Muscle weakness (generalized): Secondary | ICD-10-CM

## 2024-04-07 DIAGNOSIS — M79641 Pain in right hand: Secondary | ICD-10-CM

## 2024-04-07 DIAGNOSIS — M25631 Stiffness of right wrist, not elsewhere classified: Secondary | ICD-10-CM

## 2024-04-07 DIAGNOSIS — M25641 Stiffness of right hand, not elsewhere classified: Secondary | ICD-10-CM

## 2024-04-07 DIAGNOSIS — M25531 Pain in right wrist: Secondary | ICD-10-CM

## 2024-04-07 NOTE — Therapy (Signed)
 OUTPATIENT OCCUPATIONAL THERAPY ORTHO TREATMENT  Patient Name: Autumn Zhang MRN: [id number] DOB:04/20/1971, 53 y.o., female Today's Date: 04/07/2024  PCP: Dr Myrla REFERRING PROVIDER: Dr Sharrie  END OF SESSION:  OT End of Session - 04/07/24 1308     Visit Number 12    Number of Visits 20    Date for OT Re-Evaluation 06/14/24    OT Start Time 1203    OT Stop Time 1249    OT Time Calculation (min) 46 min    Activity Tolerance Patient tolerated treatment well;Patient limited by pain    Behavior During Therapy [facility] for tasks assessed/performed          Past Medical History:  Diagnosis Date   Allergy    Anemia    Anxiety    Arthritis    Asthma    Attention deficit hyperactivity disorder (ADHD), combined type 10/18/2015   Diagnosed many years ago.  Has been doing well on Ritalin  5mg  QID to six times a day. Long acting hasn't worked well in the past and she has not wanted to try Adderall. She is taking wellbutrin as well, but she has been weaning medication due to SE migraine.  She is now down to Wellbutrin one quarter tablet daily.  Last Assessment & Plan:  A/P: Stable.  Continue current dosing.  Refilled 3 months   Basal cell carcinoma 05/16/2021   Superficial, R post lower leg. [facility] [place], [place]   Basal cell carcinoma 09/03/2006   L chest, R forearm. [facility] [place], [place]   BRCA negative 09/2018   2015 [place] at 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   Dysautonomia (HCC)    Dysplastic nevus 05/28/2009   right lateral back, [facility] [place], [place]   Dysplastic nevus 01/13/2023   left lower calf, mod to severe, shave removal 03/16/23, margins free 03/16/23   Ehlers-Danlos syndrome type III    Family history of colon cancer    GERD (gastroesophageal reflux disease)    Hyperlipidemia    Idiopathic small fiber peripheral  neuropathy    Leiomyoma    Mast cell activation (HCC)    Migraines    Neck pain    Occipital neuralgia    Sleep apnea    Past Surgical History:  Procedure Laterality Date   APPENDECTOMY     removed with colectomy   BREAST SURGERY     reduction   COLON SURGERY  2008   for colon cancer, ~1/3 of colon removed, primary reanastimosis   COLONOSCOPY WITH PROPOFOL  N/A 10/07/2018   Procedure: COLONOSCOPY WITH PROPOFOL ;  Surgeon: Unk Corinn Skiff, MD;  Location: [facility] ENDOSCOPY;  Service: Gastroenterology;  Laterality: N/A;   COLONOSCOPY WITH PROPOFOL  N/A 08/14/2021   Procedure: COLONOSCOPY WITH PROPOFOL ;  Surgeon: Unk Corinn Skiff, MD;  Location: [facility] ENDOSCOPY;  Service: Gastroenterology;  Laterality: N/A;   ENDOMETRIAL ABLATION     trigeminal stimulator     Patient Active Problem List   Diagnosis Date Noted   Psoriatic arthritis (HCC) 12/21/2023   MTHFR mutation 12/21/2023   Lymphedema 04/28/2023   Facial swelling 04/13/2023   Liver cyst 06/24/2022   Decreased appetite 06/24/2022   Generalized abdominal pain 06/24/2022   Abnormal weight gain 02/03/2022   Central obesity 02/03/2022   Impaired fasting glucose 02/03/2022   Cervical spondylosis with radiculopathy 10/09/2021   Psoriasis 10/08/2021   Exocrine pancreatic  insufficiency 09/26/2021   Urinary frequency 09/26/2021   Atypical facial pain (1ry area of Pain) (Left) 08/29/2021   Chronic ear pain (Left) 08/29/2021   Temporal headache (Left) 08/29/2021   Inner ear pain (Left) 08/29/2021   Trigeminal nerve disorder (Left) 08/29/2021   History of photophobia 08/29/2021   TMJ (temporomandibular joint syndrome) 08/29/2021   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   Polypharmacy 08/29/2021   Severe frontal headaches 08/29/2021   Chronic occipital headache (Midline) (2ry area of Pain) 08/29/2021   Chronic neck pain (posterior) (4th area of Pain) (Bilateral) (L>R) 08/29/2021   Musculoskeletal disorder involving upper  trapezius muscle 08/29/2021   Musculoskeletal disorder involving sternocleidomastoid (Left) 08/29/2021   Abnormal MRI, cervical spine (02/11/2019) 08/29/2021   Shoulder stiffness (Right) 08/29/2021   Pharmacologic therapy 08/27/2021   Disorder of skeletal system 08/27/2021   Problems influencing health status 08/27/2021   GAD (generalized anxiety disorder) 07/17/2021   Difficulty walking 07/17/2021   Headache disorder (3ry area of Pain) 07/17/2021   Photophobia 07/17/2021   Difficulty with speech 07/17/2021   Tear film insufficiency 07/15/2021   Left lower quadrant abdominal pain 07/05/2021   Palpitations 06/11/2021   Axillary lymphadenopathy 06/11/2021   Immunosuppressed status (HCC) 04/19/2021   History of seronegative inflammatory arthritis 06/28/2020   Osteopenia of multiple sites 06/14/2020   Polyarthralgia 06/12/2020   Iodine deficiency 06/12/2020   OSA on CPAP 06/12/2020   Chronic pain syndrome 06/12/2020   Psychophysiological insomnia 11/26/2019   Calcific tendinitis of shoulder (Right) 04/10/2019   History of iron  deficiency anemia 11/04/2018   History of basal cell carcinoma 10/07/2018   Vaginal dryness 09/07/2018   Chronic jaw pain (Left) 07/15/2018   Chronic migraine with aura 07/15/2018   Ehlers-Danlos syndrome, type 3 07/15/2018   Rectocele 05/21/2018   Ehlers-Danlos syndrome 03/15/2018   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   Cardiac arrhythmia 01/21/2018   Polyp of colon 01/21/2018   Chronic constipation 01/21/2018   Instability of shoulder joint (Right) 01/12/2018   Trigeminal neuralgia (V1-3) (Left) 12/25/2017   Instability of shoulder joint (Left) 12/25/2017   Nausea 12/25/2017   Occipital neuralgia 12/25/2017   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   Hiatal hernia 10/14/2017   TMJ (dislocation of temporomandibular joint) 09/29/2017   Irritable bowel syndrome 07/02/2017   Anal fissure 07/02/2017   Anemia  07/02/2017   Gastroesophageal reflux disease 07/02/2017   Hemorrhoids 07/02/2017   Malignant neoplasm of skin 07/02/2017   Malignant tumor of colon (HCC) 07/02/2017   Arthritis 07/02/2017   Fibromyalgia 07/02/2017   Pulsatile tinnitus of ear (Left) 12/12/2016   Restless leg syndrome 08/01/2016   Cervical dystonia 03/21/2016   Cervical spine disease 03/21/2016   Cervicalgia 02/06/2016   Paresthesia 02/06/2016   Hypercholesterolemia 12/24/2015   Iron  deficiency anemia 10/18/2015   Vitamin D  deficiency disease 10/18/2015   Midline low back pain without sciatica 02/06/2015   Hyperglycemia 10/30/2014   History of colon cancer 09/27/2014   Postural orthostatic tachycardia syndrome 09/16/2003   Chronic fatigue 09/15/2000   Asthma 09/15/1988   Dyspnea 09/15/1988    ONSET DATE: 11/03/23  REFERRING DIAG: R wrist fx with numbness and pain   THERAPY DIAG:  Pain in right hand  Pain in right wrist  Stiffness of right hand, not elsewhere classified  Stiffness of right wrist, not elsewhere classified  Muscle weakness (generalized)  Rationale for Evaluation and Treatment: Rehabilitation  SUBJECTIVE:   SUBJECT:  I would like us  to try to keep my  compression glove on when we do some soft tissue and mobility -I feel like the compression give me proprioception for my joints.    Pt accompanied by: self  PERTINENT HISTORY: 01/13/24 ortho note Dr Sharrie  I personally reviewed and visualized the imaging studies if available.  - EMG/NCS RUE = evaluate for any acute nerve injury from her distal radius fracture causing her reported symptoms  Assessment:   ICD-10-CM  1. Closed Colles' fracture of right radius, initial encounter S52.531A  2. Stiffness of right wrist joint M25.631  3. Numbness and tingling in right hand R20.0  R20.2   Plan:   I have discussed the nature of her current subjective complaints, clinical examination, test results and have reviewed treatment options. The  plan is to do the following;  - The patient has ongoing right wrist pain, swelling, decreased motion, hand numbness and tingling, difficulty with hand function after a distal radius fracture on 11/03/2023. She seems to have been treated appropriately with splint followed by cast followed by brace then referred to occupational therapy. Some of her symptoms could simply be due to ordinary sequela I of injury and recovery has joint stiffness could be prolonged for up to 6-12 months. She could be developing carpal tunnel syndrome which can occur with a distal radius fracture as well. I am unsure about her ulnar sided fingers issue? This could be some nerve injury versus biomechanical abnormality because of significant loss to wrist motion versus developing complex regional pain syndrome. - She has had recent x-rays that show healing to her nondisplaced, extra-articular distal radius fracture. There is some osteopenia surrounding the joint which could be from disuse/immobilization. She has significant decreased her wrist motion. Pain limits a lot of examination. I discussed the normal recovery after distal radius fracture and how some of the things she is feeling can occur. Because of the complexity of her past medical history and her reported issues that seem neurologic, I recommended EMG ASAP to evaluate for any nerve damage. I also referred her to a different occupational therapist to see if they can provide any different treatments. Depending on the EMG results, she may need referral to neurosurgery versus hand surgery specialist versus general orthopedic surgeon. - Daily activity as tolerated. Modify activity as needed according to symptoms. No limitations to pain-free weight bearing. No need for immobilization or bracing. - Refer to OT for pain relieving modalities, manual techniques as indicated, home exercise planning. Continue home exercise program to maintain strength, flexibility, and endurance. - Use  chronic medications, over-the-counter medicines, topical diclofenac /pain cream, relative rest, compression, massage, and ice/heat as needed for pain.  - Follow up depending on EMG results. I did discuss that we could consider carpal tunnel steroid injection as a possible treatment.   PRECAUTIONS: none     WEIGHT BEARING RESTRICTIONS: No  PAIN:  Are you having pain?  Coming in 2/10 pain and during session increased tenderness pisiform and thumb FALLS: Has patient fallen in last 6 months? ?  LIVING ENVIRONMENT: Lives with: lives with their spouse    PLOF: on disability -patient occupation was International aid/development worker.  Because of her medical history she does not participate in cooking and laundry and cleaning.  But is active around the house.  Likes to walk.  On the computer and phone. Patient did prior to injury had pain at the thumb.  And at the carpal bones at times.  Appear active range of motion was within functional limits   PATIENT GOALS: I want  the pain better my right hand and wrist and get my motion and strength back so I can use my hand  NEXT MD VISIT: ?  OBJECTIVE:  Note: Objective measures were completed at Evaluation unless otherwise noted.  HAND DOMINANCE: Right  ADLs: Severe difficulty using right hand. Unable to grip, pull or push or hold or carry.  Limited by severe wrist stiffness.   FUNCTIONAL OUTCOME MEASURES: Next session  UPPER EXTREMITY ROM:     Active ROM Right eval Left eval R 02/11/24 R 02/15/24 R 02/23/24 R 03/07/24 R 03/10/24 R 03/17/24 R 04/05/24 R 04/07/24  Shoulder flexion            Shoulder abduction            Shoulder adduction            Shoulder extension            Shoulder internal rotation            Shoulder external rotation            Elbow flexion            Elbow extension            Wrist flexion 25  35 40 50 55   60 close fist 55 60 open hand and loose fist t  Wrist extension 5  10 20 15  close fist 30 20 15  open hand, 0 close fist  10  close fist , open hand 20 15 close fist and 20 open hand 15  Wrist ulnar deviation 10  15 18  20   25  35  Wrist radial deviation 5  8 12  10   18 18   Wrist pronation 90  90 90 90   90    Wrist supination 63  60 60 70 70  65 78 80  (Blank rows = not tested)  Active ROM Right eval Left eval R 02/11/24 R 02/15/24 R 02/23/24 R 04/05/24  Thumb MCP (0-60) 50    55 55  Thumb IP (0-80) 50    50 65  Thumb Radial abd/add (0-55) 45    45  48  Thumb Palmar abd/add (0-45) 50    50  50  Thumb Opposition to Small Finger Opposition to all digits but increased pain 3rd through 5th    Opposition to all digits    Index MCP (0-90) 65   70 65 75   Index PIP (0-100) 80   90 95    Index DIP (0-70)          Long MCP (0-90)  70   75 85    Long PIP (0-100)  90   95 95    Long DIP (0-70)          Ring MCP (0-90)  70   80 90    Ring PIP (0-100)  90   95 100    Ring DIP (0-70)          Little MCP (0-90)  75   90 95    Little PIP (0-100)  90   90 100    Little DIP (0-70)          (Blank rows = not tested)  HAND FUNCTION:  02/23/24  Grip strength: Right: 8 lbs; Left: 39 lbs, Lateral pinch: Right: 1 lbs, Left: 11 lbs, and 3 point pinch: Right: 2 lbs, Left: 11 lbs if providing stability at thumb CMC had 7 lbs lat  pinch and 4 3 point pinch  02/29/24 Grip strength: Right: 8 lbs; Left: 39 lbs, Lateral pinch: Right: 5 lbs, Left: 11 lbs, and 3 point pinch: Right: 3 lbs, Left: 11 lbs 03/07/24 Grip strength: NT  Lateral pinch: Right: 6 oval 8 custom lbs, Left: 11 lbs, and 3 point pinch: Right: 4 Oval 8 custom lbs, Left: 11 lbs  03/10/24 Grip strength: R 17 with oval 8 at thumb , L 41lbs  Lateral pinch: Right: 8 oval 8 custom lbs, Left: 11 lbs, and 3 point pinch: Right: 5 Oval 8 custom lbs, Left: 11 lbs 03/17/24 Grip strength: R10 with oval 8 at thumb pain palm , L 41lbs  Lateral pinch: Right: 10 oval 8 custom lbs, Left: 11 lbs, and 3 point pinch: Right: 6 Oval 8 custom lbs, Left: 11 lbs 04/05/24 Grip strength: R10 with oval 8  at thumb pain palm , L 41lbs  Lateral pinch: Right: 8 oval 8 custom lbs, Left: 9 lbs, and 3 point pinch: Right: 6 Oval 8 custom lbs, Left: 11 lbs  COORDINATION: Impaired because of severe stiffness in right hand digits and wrist   SENSATION: Patient report nerve conduction showed minimal carpal tunnel. Will further assess   NO denies numbness or sensory issue    EDEMA: Appeared to have some edema in the digits as well as the hand.  COGNITION: Overall cognitive status: Within functional limits for tasks assessed     TREATMENT DATE: 04/07/24    Patient  had appointment with Dr. Sharrie  7 July. Had a shot to the distal radius carpal joint Patient report increased flexibility at the wrist but continues pain at the pisiform I as well as achiness at the base of the thumb. Modalities: Done fluidotherapy today 8 minutes performing thumb palmar radial abduction, flexion of digits as well as wrist supination and flexion extension.    Manual therapy patient with out compression glove OT performed joint mobilization at carpal rows.  With stabilization at 2nd, 3rd and 4th metacarpal base and distal radial ulnar joint light traction with joint mobilization.  10 reps each base of metacarpal Followed by circular joint mobilization to the ulnar side as well as radial side with facilitation of gentle flexion, extension and radial ulnar deviation.  Patient tolerated very well. Husband was educated in doing it.  Hands on as well as demonstrated with feedback from patient and OT. Repeated also with Isotoner glove on. Patient showed great progress and ulnar deviation.  As well as flexion.  Extension still limited as well as radial deviation. Isometric strengthening done with ulnar and radial deviation as well as wrist flexion extension in neutral position.  Tolerated well 10 reps at home program   Providing lateral stability at thumb metacarpal  Patient tolerated soft tissue mobilization to the  webspace with gentle joint mobilization to the thumb CMC circular radial and ulnarly as well as flexion extension.   Followed by thumb radial abduction  IP flexion of the thumb improved   Hold off on isometric strengthening for the thumb.  Focus on joint mobilization and soft tissue to increase thumb flexibility in all ranges.    Ultrasound at 3.3 MHz at 20% and 1.0 intensity over thumb MCP as well as pisiform he end of session to decrease pain and edema-tolerated well.  SABRA  PATIENT EDUCATION: Education details: findings of eval and HEP  Person educated: Patient Education method: Explanation, Demonstration, Tactile cues, Verbal cues, and Handouts Education comprehension: verbalized understanding, returned demonstration, verbal cues required, and needs further education    GOALS: Goals reviewed with patient? Yes   LONG TERM GOALS: Target date: 8 wks  Patient and husband to be independent in home program for patient to decrease resting hand pain to less than 5/10. Baseline: No knowledge about splinting or modalities and home exercise program Goal status: Met  2.  Right digits  AROM improve for patient to be able to touch palm Baseline: MC flexion 65 to 75 degrees PIP flexion 80 to 90 degrees with increased pain and increase edema Goal status: Met  3.  Patient pain decreased to less than 3/10 at rest for patient t at wrist to initiate PROM and AAROM  range of motion to wrist flexion extension Baseline: Wrist extension 5 degrees flexion 25.  Pain 2-9/10 at rest  NOW resting pain improved.  But patient cannot tolerate wrist extension as well as supination.  And radial ulnar deviation.  Increased pain at the pisiform I as well as volar wrist Goal status: Progressing  4.  R thumb AROM increase to [facility] for pt to do buttons, write, hold cup  Baseline: Right thumb  palmar radial abduction 45 to 50 degrees with pain.  Unable to maintain position.  Wearing tape to support thumb into adduction at base.  Opposition pain to 3rd through 5th. NOW active range of motion for thumb improving in opposition as well as palmar radial abduction.  Do feel some instability with radial and palmar abduction.  As well as flexion with pressure.  She use a custom oval 8 on thumb for lateral stabilization Goal status: Progressing  5.  Right forearm improved for patient to turn doorknobs symptoms.  Baseline: Supination 63 degrees unable to use forearm NOW limited progress because of increased pain with attempts of range of motion for supination at ulnar wrist and pisiform Goal status: Progressing  6.  Right wrist active and improved for patient to perform 50% of ADLs Baseline: Wrist severely limited radial deviation5 /ulnar deviation 10; extension 5 degrees of flexion 25 with pain NOW patient may progress in wrist flexion as well as radial ulnar deviation.  Limited in progress in wrist extension and supination by pain Goal status: Progressing  ASSESSMENT:  CLINICAL IMPRESSION: Patient seen  for occupational therapy  for right Colles' fracture on 11/03/23.  Patient had a cast t presented in a prefab splint because a lot of pain.  Patient had  few OT sessions but progress was limited by pain.  Patient referred by Dr. Sharrie with severe stiffness in right dominant hand and wrist as well as pain and numbness.  Patient has history of Ehlers-Denlos syndrome-per patient nerve conduction test showed minimal carpal tunnel.  Patient present at OT evaluation with severe stiffness in wrist in all planes.  As well as pain in the thumb and digits.  Patient reports prior to injury patient active range of motion in right wrist and hand was within functional limits did had some pain in thumb and carpal bones.  Patient hypersensitive and tender to touch as well as soft tissue mobilization.  Unable to  tolerate.  NOW- since start of care patient made great progress in digits flexion as well as thumb palmar /radial abduction.  As well as opposition. Pt wearing custom oval 8 splint fabricated by OT with a lateral  support on the thumb .-Needed twice modifications but patient with splint on do have great progress in lateral and 3 point pinch.  Provide stability laterally on the thumb with loading. Patient to focus on functional strengthening for prevention and can do light blue putty for gripping.  Patient made great progress in wrist flexion as well as radial ulnar deviation.  But limited in progress for wrist extension -patient had appointment with Dr. Sharrie on 7th July-had a shot at the distal radius carpal joint.  Was able to tolerate joint mobilization with light traction distal carpal rows with stabilization at base of 2nd through 4th metacarpal and distal radial ulnar joint.  Able to facilitate wrist flexion extension as well as radial ulnar deviation.  Educated husband to perform.  With stabilization at lateral thumb MC was able to tolerate more soft tissue and joint mobilization to the thumb.  But continues pain over the pisiform .  As well as some soreness pain at base of thumb.  Isometric strengthening added for radial ulnar deviation as well as flexion extension at the wrist in neutral..  Focus on wrist extension progression strengthening isometric at the wrist and thumb.  Patient to use modalities to decrease pain, edema -and increased motion in combination with splint use and medication for pain.  Patient can benefit from skilled OT services to decrease pain and edema and increase motion and strength to return to prior level of function.  PERFORMANCE DEFICITS: in functional skills including ADLs, IADLs, ROM, strength, pain, flexibility, decreased knowledge of use of DME, and UE functional use,   and psychosocial skills including environmental adaptation and routines and behaviors.   IMPAIRMENTS:  are limiting patient from ADLs, IADLs, rest and sleep, play, leisure, and social participation.   COMORBIDITIES: has no other co-morbidities that affects occupational performance. Patient will benefit from skilled OT to address above impairments and improve overall function.  MODIFICATION OR ASSISTANCE TO COMPLETE EVALUATION: No modification of tasks or assist necessary to complete an evaluation.  OT OCCUPATIONAL PROFILE AND HISTORY: Problem focused assessment: Including review of records relating to presenting problem.  CLINICAL DECISION MAKING: LOW - limited treatment options, no task modification necessary  REHAB POTENTIAL: Good for goals  EVALUATION COMPLEXITY: Low     PLAN:  OT FREQUENCY: 1-2x/week  OT DURATION: 10 wks  PLANNED INTERVENTIONS: 97168 OT Re-evaluation, 97535 self care/ADL training, 02889 therapeutic exercise, 97530 therapeutic activity, 97140 manual therapy, 97018 paraffin, 02960 fluidotherapy, 97034 contrast bath, 97033 iontophoresis, passive range of motion, patient/family education, and DME and/or AE instructions    CONSULTED AND AGREED WITH PLAN OF CARE: Patient     Ancel Peters, OTR/L,CLT 04/07/2024, 1:12 PM

## 2024-04-08 ENCOUNTER — Encounter: Payer: Self-pay | Admitting: Oncology

## 2024-04-08 DIAGNOSIS — D509 Iron deficiency anemia, unspecified: Secondary | ICD-10-CM

## 2024-04-08 NOTE — Telephone Encounter (Signed)
 Outbound call to patient to inform lab orders have been put in and patient can come for LOV prior to regularly scheduled appointment 04/29/24 (okay'd by Dasie).  Patient agreed to come on Monday 7/28 at 10am for a lab only visit.  Forwarding message to scheduling to coordinate.

## 2024-04-08 NOTE — Telephone Encounter (Signed)
 Per Tenitia Pt scheduled for 7/28 at 9:45 am. The 10 am spots were full..  No further follow up needed.

## 2024-04-11 ENCOUNTER — Inpatient Hospital Stay: Attending: Oncology

## 2024-04-11 DIAGNOSIS — D509 Iron deficiency anemia, unspecified: Secondary | ICD-10-CM | POA: Diagnosis present

## 2024-04-11 LAB — CBC (CANCER CENTER ONLY)
HCT: 38 % (ref 36.0–46.0)
Hemoglobin: 12.3 g/dL (ref 12.0–15.0)
MCH: 30.1 pg (ref 26.0–34.0)
MCHC: 32.4 g/dL (ref 30.0–36.0)
MCV: 93.1 fL (ref 80.0–100.0)
Platelet Count: 271 K/uL (ref 150–400)
RBC: 4.08 MIL/uL (ref 3.87–5.11)
RDW: 12.3 % (ref 11.5–15.5)
WBC Count: 6.3 K/uL (ref 4.0–10.5)
nRBC: 0 % (ref 0.0–0.2)

## 2024-04-11 LAB — FERRITIN: Ferritin: 44 ng/mL (ref 11–307)

## 2024-04-11 LAB — IRON AND TIBC
Iron: 57 ug/dL (ref 28–170)
Saturation Ratios: 14 % (ref 10.4–31.8)
TIBC: 399 ug/dL (ref 250–450)
UIBC: 342 ug/dL

## 2024-04-12 ENCOUNTER — Ambulatory Visit: Admitting: Occupational Therapy

## 2024-04-12 DIAGNOSIS — M79641 Pain in right hand: Secondary | ICD-10-CM

## 2024-04-12 DIAGNOSIS — M25531 Pain in right wrist: Secondary | ICD-10-CM

## 2024-04-12 DIAGNOSIS — M25631 Stiffness of right wrist, not elsewhere classified: Secondary | ICD-10-CM

## 2024-04-12 DIAGNOSIS — M6281 Muscle weakness (generalized): Secondary | ICD-10-CM

## 2024-04-12 DIAGNOSIS — M25641 Stiffness of right hand, not elsewhere classified: Secondary | ICD-10-CM

## 2024-04-12 NOTE — Therapy (Signed)
 OUTPATIENT OCCUPATIONAL THERAPY ORTHO TREATMENT  Patient Name: Autumn Zhang MRN: [id number] DOB:May 16, 1971, 53 y.o., female Today's Date: 04/12/2024  PCP: Dr Myrla REFERRING PROVIDER: Dr Sharrie  END OF SESSION:  OT End of Session - 04/12/24 1157     Visit Number 13    Number of Visits 20    Date for OT Re-Evaluation 06/14/24    OT Start Time 1200    OT Stop Time 1244    OT Time Calculation (min) 44 min    Activity Tolerance Patient tolerated treatment well;Patient limited by pain    Behavior During Therapy [facility] for tasks assessed/performed          Past Medical History:  Diagnosis Date   Allergy    Anemia    Anxiety    Arthritis    Asthma    Attention deficit hyperactivity disorder (ADHD), combined type 10/18/2015   Diagnosed many years ago.  Has been doing well on Ritalin  5mg  QID to six times a day. Long acting hasn't worked well in the past and she has not wanted to try Adderall. She is taking wellbutrin as well, but she has been weaning medication due to SE migraine.  She is now down to Wellbutrin one quarter tablet daily.  Last Assessment & Plan:  A/P: Stable.  Continue current dosing.  Refilled 3 months   Basal cell carcinoma 05/16/2021   Superficial, R post lower leg. [facility] [place], [place]   Basal cell carcinoma 09/03/2006   L chest, R forearm. 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   Dysautonomia (HCC)    Dysplastic nevus 05/28/2009   right lateral back, [facility] [place], [place]   Dysplastic nevus 01/13/2023   left lower calf, mod to severe, shave removal 03/16/23, margins free 03/16/23   Ehlers-Danlos syndrome type III    Family history of colon cancer    GERD (gastroesophageal reflux disease)    Hyperlipidemia    Idiopathic small fiber peripheral  neuropathy    Leiomyoma    Mast cell activation (HCC)    Migraines    Neck pain    Occipital neuralgia    Sleep apnea    Past Surgical History:  Procedure Laterality Date   APPENDECTOMY     removed with colectomy   BREAST SURGERY     reduction   COLON SURGERY  2008   for colon cancer, ~1/3 of colon removed, primary reanastimosis   COLONOSCOPY WITH PROPOFOL  N/A 10/07/2018   Procedure: COLONOSCOPY WITH PROPOFOL ;  Surgeon: Unk Corinn Skiff, MD;  Location: [facility] ENDOSCOPY;  Service: Gastroenterology;  Laterality: N/A;   COLONOSCOPY WITH PROPOFOL  N/A 08/14/2021   Procedure: COLONOSCOPY WITH PROPOFOL ;  Surgeon: Unk Corinn Skiff, MD;  Location: [facility] ENDOSCOPY;  Service: Gastroenterology;  Laterality: N/A;   ENDOMETRIAL ABLATION     trigeminal stimulator     Patient Active Problem List   Diagnosis Date Noted   Psoriatic arthritis (HCC) 12/21/2023   MTHFR mutation 12/21/2023   Lymphedema 04/28/2023   Facial swelling 04/13/2023   Liver cyst 06/24/2022   Decreased appetite 06/24/2022   Generalized abdominal pain 06/24/2022   Abnormal weight gain 02/03/2022   Central obesity 02/03/2022   Impaired fasting glucose 02/03/2022   Cervical spondylosis with radiculopathy 10/09/2021   Psoriasis 10/08/2021   Exocrine pancreatic  insufficiency 09/26/2021   Urinary frequency 09/26/2021   Atypical facial pain (1ry area of Pain) (Left) 08/29/2021   Chronic ear pain (Left) 08/29/2021   Temporal headache (Left) 08/29/2021   Inner ear pain (Left) 08/29/2021   Trigeminal nerve disorder (Left) 08/29/2021   History of photophobia 08/29/2021   TMJ (temporomandibular joint syndrome) 08/29/2021   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   Polypharmacy 08/29/2021   Severe frontal headaches 08/29/2021   Chronic occipital headache (Midline) (2ry area of Pain) 08/29/2021   Chronic neck pain (posterior) (4th area of Pain) (Bilateral) (L>R) 08/29/2021   Musculoskeletal disorder involving upper  trapezius muscle 08/29/2021   Musculoskeletal disorder involving sternocleidomastoid (Left) 08/29/2021   Abnormal MRI, cervical spine (02/11/2019) 08/29/2021   Shoulder stiffness (Right) 08/29/2021   Pharmacologic therapy 08/27/2021   Disorder of skeletal system 08/27/2021   Problems influencing health status 08/27/2021   GAD (generalized anxiety disorder) 07/17/2021   Difficulty walking 07/17/2021   Headache disorder (3ry area of Pain) 07/17/2021   Photophobia 07/17/2021   Difficulty with speech 07/17/2021   Tear film insufficiency 07/15/2021   Left lower quadrant abdominal pain 07/05/2021   Palpitations 06/11/2021   Axillary lymphadenopathy 06/11/2021   Immunosuppressed status (HCC) 04/19/2021   History of seronegative inflammatory arthritis 06/28/2020   Osteopenia of multiple sites 06/14/2020   Polyarthralgia 06/12/2020   Iodine deficiency 06/12/2020   OSA on CPAP 06/12/2020   Chronic pain syndrome 06/12/2020   Psychophysiological insomnia 11/26/2019   Calcific tendinitis of shoulder (Right) 04/10/2019   History of iron  deficiency anemia 11/04/2018   History of basal cell carcinoma 10/07/2018   Vaginal dryness 09/07/2018   Chronic jaw pain (Left) 07/15/2018   Chronic migraine with aura 07/15/2018   Ehlers-Danlos syndrome, type 3 07/15/2018   Rectocele 05/21/2018   Ehlers-Danlos syndrome 03/15/2018   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   Cardiac arrhythmia 01/21/2018   Polyp of colon 01/21/2018   Chronic constipation 01/21/2018   Instability of shoulder joint (Right) 01/12/2018   Trigeminal neuralgia (V1-3) (Left) 12/25/2017   Instability of shoulder joint (Left) 12/25/2017   Nausea 12/25/2017   Occipital neuralgia 12/25/2017   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   Hiatal hernia 10/14/2017   TMJ (dislocation of temporomandibular joint) 09/29/2017   Irritable bowel syndrome 07/02/2017   Anal fissure 07/02/2017   Anemia  07/02/2017   Gastroesophageal reflux disease 07/02/2017   Hemorrhoids 07/02/2017   Malignant neoplasm of skin 07/02/2017   Malignant tumor of colon (HCC) 07/02/2017   Arthritis 07/02/2017   Fibromyalgia 07/02/2017   Pulsatile tinnitus of ear (Left) 12/12/2016   Restless leg syndrome 08/01/2016   Cervical dystonia 03/21/2016   Cervical spine disease 03/21/2016   Cervicalgia 02/06/2016   Paresthesia 02/06/2016   Hypercholesterolemia 12/24/2015   Iron  deficiency anemia 10/18/2015   Vitamin D  deficiency disease 10/18/2015   Midline low back pain without sciatica 02/06/2015   Hyperglycemia 10/30/2014   History of colon cancer 09/27/2014   Postural orthostatic tachycardia syndrome 09/16/2003   Chronic fatigue 09/15/2000   Asthma 09/15/1988   Dyspnea 09/15/1988    ONSET DATE: 11/03/23  REFERRING DIAG: R wrist fx with numbness and pain   THERAPY DIAG:  Pain in right hand  Pain in right wrist  Stiffness of right hand, not elsewhere classified  Stiffness of right wrist, not elsewhere classified  Muscle weakness (generalized)  Rationale for Evaluation and Treatment: Rehabilitation  SUBJECTIVE:   SUBJECT:  You did go over again with us  how to  do that joint mobilization with the wrist.  And then my Pisiforme hurts today.  Pt accompanied by: self  PERTINENT HISTORY: 01/13/24 ortho note Dr Sharrie  I personally reviewed and visualized the imaging studies if available.  - EMG/NCS RUE = evaluate for any acute nerve injury from her distal radius fracture causing her reported symptoms  Assessment:   ICD-10-CM  1. Closed Colles' fracture of right radius, initial encounter S52.531A  2. Stiffness of right wrist joint M25.631  3. Numbness and tingling in right hand R20.0  R20.2   Plan:   I have discussed the nature of her current subjective complaints, clinical examination, test results and have reviewed treatment options. The plan is to do the following;  - The patient  has ongoing right wrist pain, swelling, decreased motion, hand numbness and tingling, difficulty with hand function after a distal radius fracture on 11/03/2023. She seems to have been treated appropriately with splint followed by cast followed by brace then referred to occupational therapy. Some of her symptoms could simply be due to ordinary sequela I of injury and recovery has joint stiffness could be prolonged for up to 6-12 months. She could be developing carpal tunnel syndrome which can occur with a distal radius fracture as well. I am unsure about her ulnar sided fingers issue? This could be some nerve injury versus biomechanical abnormality because of significant loss to wrist motion versus developing complex regional pain syndrome. - She has had recent x-rays that show healing to her nondisplaced, extra-articular distal radius fracture. There is some osteopenia surrounding the joint which could be from disuse/immobilization. She has significant decreased her wrist motion. Pain limits a lot of examination. I discussed the normal recovery after distal radius fracture and how some of the things she is feeling can occur. Because of the complexity of her past medical history and her reported issues that seem neurologic, I recommended EMG ASAP to evaluate for any nerve damage. I also referred her to a different occupational therapist to see if they can provide any different treatments. Depending on the EMG results, she may need referral to neurosurgery versus hand surgery specialist versus general orthopedic surgeon. - Daily activity as tolerated. Modify activity as needed according to symptoms. No limitations to pain-free weight bearing. No need for immobilization or bracing. - Refer to OT for pain relieving modalities, manual techniques as indicated, home exercise planning. Continue home exercise program to maintain strength, flexibility, and endurance. - Use chronic medications, over-the-counter medicines,  topical diclofenac /pain cream, relative rest, compression, massage, and ice/heat as needed for pain.  - Follow up depending on EMG results. I did discuss that we could consider carpal tunnel steroid injection as a possible treatment.   PRECAUTIONS: none     WEIGHT BEARING RESTRICTIONS: No  PAIN:  Are you having pain?  Coming in 6/10 pisiforme and thumb MC FALLS: Has patient fallen in last 6 months? ?  LIVING ENVIRONMENT: Lives with: lives with their spouse    PLOF: on disability -patient occupation was International aid/development worker.  Because of her medical history she does not participate in cooking and laundry and cleaning.  But is active around the house.  Likes to walk.  On the computer and phone. Patient did prior to injury had pain at the thumb.  And at the carpal bones at times.  Appear active range of motion was within functional limits   PATIENT GOALS: I want the pain better my right hand and wrist and get my motion and strength back  so I can use my hand  NEXT MD VISIT: ?  OBJECTIVE:  Note: Objective measures were completed at Evaluation unless otherwise noted.  HAND DOMINANCE: Right  ADLs: Severe difficulty using right hand. Unable to grip, pull or push or hold or carry.  Limited by severe wrist stiffness.   FUNCTIONAL OUTCOME MEASURES: Next session  UPPER EXTREMITY ROM:     Active ROM Right eval Left eval R 02/11/24 R 02/15/24 R 02/23/24 R 03/07/24 R 03/10/24 R 03/17/24 R 04/05/24 R 04/07/24  Shoulder flexion            Shoulder abduction            Shoulder adduction            Shoulder extension            Shoulder internal rotation            Shoulder external rotation            Elbow flexion            Elbow extension            Wrist flexion 25  35 40 50 55   60 close fist 55 60 open hand and loose fist t  Wrist extension 5  10 20 15  close fist 30 20 15  open hand, 0 close fist  10 close fist , open hand 20 15 close fist and 20 open hand 15  Wrist ulnar deviation 10   15 18  20   25  35  Wrist radial deviation 5  8 12  10   18 18   Wrist pronation 90  90 90 90   90    Wrist supination 63  60 60 70 70  65 78 80  (Blank rows = not tested)  Active ROM Right eval Left eval R 02/11/24 R 02/15/24 R 02/23/24 R 04/05/24  Thumb MCP (0-60) 50    55 55  Thumb IP (0-80) 50    50 65  Thumb Radial abd/add (0-55) 45    45  48  Thumb Palmar abd/add (0-45) 50    50  50  Thumb Opposition to Small Finger Opposition to all digits but increased pain 3rd through 5th    Opposition to all digits    Index MCP (0-90) 65   70 65 75   Index PIP (0-100) 80   90 95    Index DIP (0-70)          Long MCP (0-90)  70   75 85    Long PIP (0-100)  90   95 95    Long DIP (0-70)          Ring MCP (0-90)  70   80 90    Ring PIP (0-100)  90   95 100    Ring DIP (0-70)          Little MCP (0-90)  75   90 95    Little PIP (0-100)  90   90 100    Little DIP (0-70)          (Blank rows = not tested)  HAND FUNCTION:  02/23/24  Grip strength: Right: 8 lbs; Left: 39 lbs, Lateral pinch: Right: 1 lbs, Left: 11 lbs, and 3 point pinch: Right: 2 lbs, Left: 11 lbs if providing stability at thumb CMC had 7 lbs lat pinch and 4 3 point pinch  02/29/24 Grip strength: Right: 8 lbs; Left: 39  lbs, Lateral pinch: Right: 5 lbs, Left: 11 lbs, and 3 point pinch: Right: 3 lbs, Left: 11 lbs 03/07/24 Grip strength: NT  Lateral pinch: Right: 6 oval 8 custom lbs, Left: 11 lbs, and 3 point pinch: Right: 4 Oval 8 custom lbs, Left: 11 lbs  03/10/24 Grip strength: R 17 with oval 8 at thumb , L 41lbs  Lateral pinch: Right: 8 oval 8 custom lbs, Left: 11 lbs, and 3 point pinch: Right: 5 Oval 8 custom lbs, Left: 11 lbs 03/17/24 Grip strength: R10 with oval 8 at thumb pain palm , L 41lbs  Lateral pinch: Right: 10 oval 8 custom lbs, Left: 11 lbs, and 3 point pinch: Right: 6 Oval 8 custom lbs, Left: 11 lbs 04/05/24 Grip strength: R10 with oval 8 at thumb pain palm , L 41lbs  Lateral pinch: Right: 8 oval 8 custom lbs, Left: 9 lbs, and  3 point pinch: Right: 6 Oval 8 custom lbs, Left: 11 lbs  COORDINATION: Impaired because of severe stiffness in right hand digits and wrist   SENSATION: Patient report nerve conduction showed minimal carpal tunnel. Will further assess   NO denies numbness or sensory issue    EDEMA: Appeared to have some edema in the digits as well as the hand.  COGNITION: Overall cognitive status: Within functional limits for tasks assessed     TREATMENT DATE: 04/12/24     Patient report increased flexibility at the wrist but continues pain at the pisiform  as well as thumb MC Modalities: Done fluidotherapy today 8 minutes performing thumb palmar radial abduction, flexion of digits as well as wrist supination and flexion extension.    Manual therapy patient with out compression glove OT performed joint mobilization at carpal rows.  With stabilization at 2nd, 3rd and 4th metacarpal base and distal radial ulnar joint light traction with joint mobilization.  10 reps each base of metacarpal Followed by circular joint mobilization to the ulnar side as well as radial side with facilitation of gentle flexion, extension and radial ulnar deviation.  Patient tolerated very well. Reviewed again with husband doing joint mobilization of the wrist hands on as well as demonstrated with feedback from patient and OT. Repeated also with Isotoner glove on. Patient showed great progress and ulnar deviation.  As well as flexion.  Extension still limited as well as radial deviation.    Providing lateral stability at thumb metacarpal  Patient tolerated soft tissue mobilization to the webspace with gentle joint mobilization to the thumb CMC circular radial and ulnarly as well as flexion extension.   Done kinesiotaping around thenar eminence with MCP and proximal phalanges included with stretch of Kinesiotape to dorsal ulnar wrist over the dorsal wrist providing lateral stability for the thumb Patient able to do  increased palmar radial abduction felt more stable. Able to do isometric strengthening in all directions for thumb 10 reps 2 sets Facilitate twisting golf ball and light blue putty maintaining 45 palmar abduction clocks wise and onto clocks.  Patient compensates with IP MC flexion. Hold off not added to home exercises   Patient to do at home the Kinesiotape-wide- from thenar eminence around thumb CMC and proximal phalanges dorsal hand to the ulnar wrist including pisiform I for stability and decrease pain     .  PATIENT EDUCATION: Education details: findings of eval and HEP  Person educated: Patient Education method: Explanation, Demonstration, Tactile cues, Verbal cues, and Handouts Education comprehension: verbalized understanding, returned demonstration, verbal cues required, and needs further education    GOALS: Goals reviewed with patient? Yes   LONG TERM GOALS: Target date: 8 wks  Patient and husband to be independent in home program for patient to decrease resting hand pain to less than 5/10. Baseline: No knowledge about splinting or modalities and home exercise program Goal status: Met  2.  Right digits  AROM improve for patient to be able to touch palm Baseline: MC flexion 65 to 75 degrees PIP flexion 80 to 90 degrees with increased pain and increase edema Goal status: Met  3.  Patient pain decreased to less than 3/10 at rest for patient t at wrist to initiate PROM and AAROM  range of motion to wrist flexion extension Baseline: Wrist extension 5 degrees flexion 25.  Pain 2-9/10 at rest  NOW resting pain improved.  But patient cannot tolerate wrist extension as well as supination.  And radial ulnar deviation.  Increased pain at the pisiform I as well as volar wrist Goal status: Progressing  4.  R thumb AROM increase to [facility] for pt to do buttons,  write, hold cup  Baseline: Right thumb palmar radial abduction 45 to 50 degrees with pain.  Unable to maintain position.  Wearing tape to support thumb into adduction at base.  Opposition pain to 3rd through 5th. NOW active range of motion for thumb improving in opposition as well as palmar radial abduction.  Do feel some instability with radial and palmar abduction.  As well as flexion with pressure.  She use a custom oval 8 on thumb for lateral stabilization Goal status: Progressing  5.  Right forearm improved for patient to turn doorknobs symptoms.  Baseline: Supination 63 degrees unable to use forearm NOW limited progress because of increased pain with attempts of range of motion for supination at ulnar wrist and pisiform Goal status: Progressing  6.  Right wrist active and improved for patient to perform 50% of ADLs Baseline: Wrist severely limited radial deviation5 /ulnar deviation 10; extension 5 degrees of flexion 25 with pain NOW patient may progress in wrist flexion as well as radial ulnar deviation.  Limited in progress in wrist extension and supination by pain Goal status: Progressing  ASSESSMENT:  CLINICAL IMPRESSION: Patient seen  for occupational therapy  for right Colles' fracture on 11/03/23.  Patient had a cast t presented in a prefab splint because a lot of pain.  Patient had  few OT sessions but progress was limited by pain.  Patient referred by Dr. Sharrie with severe stiffness in right dominant hand and wrist as well as pain and numbness.  Patient has history of Ehlers-Denlos syndrome-per patient nerve conduction test showed minimal carpal tunnel.  Patient present at OT evaluation with severe stiffness in wrist in all planes.  As well as pain in the thumb and digits.  Patient reports prior to injury patient active range of motion in right wrist and hand was within functional limits did had some pain in thumb and carpal bones.  Patient hypersensitive and tender to touch as well as  soft tissue mobilization.  Unable to tolerate.  NOW- since start of care patient made great progress in digits flexion as well as thumb palmar /radial abduction.  As well as opposition. Pt wearing custom oval 8 splint fabricated by OT with a lateral  support on the thumb .-Needed twice modifications but patient with splint on do have great progress in lateral and 3 point pinch.  Provide stability laterally on the thumb with loading. Patient to focus on functional strengthening for prevention and can do light blue putty for gripping.  Patient made great progress in wrist flexion as well as radial ulnar deviation.  But limited in progress for wrist extension -patient had appointment with Dr. Sharrie on 7th July-had a shot at the distal radius carpal joint.  Was able to tolerate joint mobilization with light traction distal carpal rows with stabilization at base of 2nd through 4th metacarpal and distal radial ulnar joint again today able to facilitate wrist flexion extension as well as radial ulnar deviation.  Husband needed review again to perform at home.  White Kinesiotape done today for thumb MCP stability from thenar eminence around thumb MCP proximal phalanges over dorsal wrist around to ulnar wrist and pisiform eye.  Patient can do at home.  And try isometric strengthening for the thumb.  Continue with Isometric strengthening for radial ulnar deviation as well as flexion extension at the wrist in neutral..  Focus on wrist extension progression strengthening isometric at the wrist and thumb.  Patient to use modalities to decrease pain, edema -and increased motion in combination with splint use and medication for pain.  Patient can benefit from skilled OT services to decrease pain and edema and increase motion and strength to return to prior level of function.  PERFORMANCE DEFICITS: in functional skills including ADLs, IADLs, ROM, strength, pain, flexibility, decreased knowledge of use of DME, and UE functional  use,   and psychosocial skills including environmental adaptation and routines and behaviors.   IMPAIRMENTS: are limiting patient from ADLs, IADLs, rest and sleep, play, leisure, and social participation.   COMORBIDITIES: has no other co-morbidities that affects occupational performance. Patient will benefit from skilled OT to address above impairments and improve overall function.  MODIFICATION OR ASSISTANCE TO COMPLETE EVALUATION: No modification of tasks or assist necessary to complete an evaluation.  OT OCCUPATIONAL PROFILE AND HISTORY: Problem focused assessment: Including review of records relating to presenting problem.  CLINICAL DECISION MAKING: LOW - limited treatment options, no task modification necessary  REHAB POTENTIAL: Good for goals  EVALUATION COMPLEXITY: Low     PLAN:  OT FREQUENCY: 1-2x/week  OT DURATION: 10 wks  PLANNED INTERVENTIONS: 97168 OT Re-evaluation, 97535 self care/ADL training, 02889 therapeutic exercise, 97530 therapeutic activity, 97140 manual therapy, 97018 paraffin, 02960 fluidotherapy, 97034 contrast bath, 97033 iontophoresis, passive range of motion, patient/family education, and DME and/or AE instructions    CONSULTED AND AGREED WITH PLAN OF CARE: Patient     Ancel Peters, OTR/L,CLT 04/12/2024, 5:34 PM

## 2024-04-13 NOTE — Telephone Encounter (Addendum)
 Per Dr. KATHEE Belton to schedule ferriclit x weekly- 2 infusions.  My chart message sent to patient informing of IV iron .  Forwarding to scheduling team to coordinate.

## 2024-04-17 NOTE — Telephone Encounter (Signed)
 Megan- ok to schedule ferrlicit infusions. Usually she gets 3 infusions Q2 weeks

## 2024-04-18 ENCOUNTER — Other Ambulatory Visit: Payer: Self-pay | Admitting: Sports Medicine

## 2024-04-18 ENCOUNTER — Ambulatory Visit: Admitting: Occupational Therapy

## 2024-04-18 DIAGNOSIS — R200 Anesthesia of skin: Secondary | ICD-10-CM

## 2024-04-18 DIAGNOSIS — M25631 Stiffness of right wrist, not elsewhere classified: Secondary | ICD-10-CM

## 2024-04-18 DIAGNOSIS — S52531D Colles' fracture of right radius, subsequent encounter for closed fracture with routine healing: Secondary | ICD-10-CM

## 2024-04-19 ENCOUNTER — Telehealth: Payer: Self-pay

## 2024-04-19 ENCOUNTER — Encounter: Payer: Self-pay | Admitting: Family Medicine

## 2024-04-19 LAB — HM MAMMOGRAPHY

## 2024-04-19 NOTE — Telephone Encounter (Signed)
 Patient confirmed just cetirizine  IV for pre-meds.  Patient inquired since she only went 4 months since her last infusion if she should have 4 treatments this time.  Advised it's common for labs to be checked mid treatment to help guide future treatment plans.  Patient verbalized understanding and has no additional questions / concerns at this time.

## 2024-04-20 ENCOUNTER — Encounter: Payer: Self-pay | Admitting: Podiatry

## 2024-04-21 ENCOUNTER — Ambulatory Visit
Admission: RE | Admit: 2024-04-21 | Discharge: 2024-04-21 | Disposition: A | Discharge status: Home / Self Care | Source: Ambulatory Visit | Attending: Sports Medicine | Admitting: Sports Medicine

## 2024-04-21 ENCOUNTER — Ambulatory Visit: Attending: Sports Medicine | Admitting: Occupational Therapy

## 2024-04-21 DIAGNOSIS — M25531 Pain in right wrist: Secondary | ICD-10-CM | POA: Diagnosis present

## 2024-04-21 DIAGNOSIS — R200 Anesthesia of skin: Secondary | ICD-10-CM | POA: Insufficient documentation

## 2024-04-21 DIAGNOSIS — M25641 Stiffness of right hand, not elsewhere classified: Secondary | ICD-10-CM | POA: Insufficient documentation

## 2024-04-21 DIAGNOSIS — M6281 Muscle weakness (generalized): Secondary | ICD-10-CM | POA: Insufficient documentation

## 2024-04-21 DIAGNOSIS — M79641 Pain in right hand: Secondary | ICD-10-CM | POA: Diagnosis present

## 2024-04-21 DIAGNOSIS — R202 Paresthesia of skin: Secondary | ICD-10-CM | POA: Insufficient documentation

## 2024-04-21 DIAGNOSIS — M25631 Stiffness of right wrist, not elsewhere classified: Secondary | ICD-10-CM | POA: Diagnosis present

## 2024-04-21 DIAGNOSIS — S52531D Colles' fracture of right radius, subsequent encounter for closed fracture with routine healing: Secondary | ICD-10-CM | POA: Insufficient documentation

## 2024-04-21 NOTE — Therapy (Signed)
 OUTPATIENT OCCUPATIONAL THERAPY ORTHO TREATMENT  Patient Name: Autumn Zhang MRN: [id number] DOB:08/28/1971, 53 y.o., female Today's Date: 04/21/2024  PCP: Dr Myrla REFERRING PROVIDER: Dr Sharrie  END OF SESSION:  OT End of Session - 04/21/24 1115     Visit Number 14    Number of Visits 20    Date for OT Re-Evaluation 06/14/24    OT Start Time 1115    OT Stop Time 1210    OT Time Calculation (min) 55 min    Activity Tolerance Patient tolerated treatment well;Patient limited by pain    Behavior During Therapy [facility] for tasks assessed/performed          Past Medical History:  Diagnosis Date   Allergy    Anemia    Anxiety    Arthritis    Asthma    Attention deficit hyperactivity disorder (ADHD), combined type 10/18/2015   Diagnosed many years ago.  Has been doing well on Ritalin  5mg  QID to six times a day. Long acting hasn't worked well in the past and she has not wanted to try Adderall. She is taking wellbutrin as well, but she has been weaning medication due to SE migraine.  She is now down to Wellbutrin one quarter tablet daily.  Last Assessment & Plan:  A/P: Stable.  Continue current dosing.  Refilled 3 months   Basal cell carcinoma 05/16/2021   Superficial, R post lower leg. [facility] [place], [place]   Basal cell carcinoma 09/03/2006   L chest, R forearm. 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   Dysautonomia (HCC)    Dysplastic nevus 05/28/2009   right lateral back, [facility] [place], [place]   Dysplastic nevus 01/13/2023   left lower calf, mod to severe, shave removal 03/16/23, margins free 03/16/23   Ehlers-Danlos syndrome type III    Family history of colon cancer    GERD (gastroesophageal reflux disease)    Hyperlipidemia    Idiopathic small fiber peripheral  neuropathy    Leiomyoma    Mast cell activation (HCC)    Migraines    Neck pain    Occipital neuralgia    Sleep apnea    Past Surgical History:  Procedure Laterality Date   APPENDECTOMY     removed with colectomy   BREAST SURGERY     reduction   COLON SURGERY  2008   for colon cancer, ~1/3 of colon removed, primary reanastimosis   COLONOSCOPY WITH PROPOFOL  N/A 10/07/2018   Procedure: COLONOSCOPY WITH PROPOFOL ;  Surgeon: Unk Corinn Skiff, MD;  Location: [facility] ENDOSCOPY;  Service: Gastroenterology;  Laterality: N/A;   COLONOSCOPY WITH PROPOFOL  N/A 08/14/2021   Procedure: COLONOSCOPY WITH PROPOFOL ;  Surgeon: Unk Corinn Skiff, MD;  Location: [facility] ENDOSCOPY;  Service: Gastroenterology;  Laterality: N/A;   ENDOMETRIAL ABLATION     trigeminal stimulator     Patient Active Problem List   Diagnosis Date Noted   Psoriatic arthritis (HCC) 12/21/2023   MTHFR mutation 12/21/2023   Lymphedema 04/28/2023   Facial swelling 04/13/2023   Liver cyst 06/24/2022   Decreased appetite 06/24/2022   Generalized abdominal pain 06/24/2022   Abnormal weight gain 02/03/2022   Central obesity 02/03/2022   Impaired fasting glucose 02/03/2022   Cervical spondylosis with radiculopathy 10/09/2021   Psoriasis 10/08/2021   Exocrine pancreatic  insufficiency 09/26/2021   Urinary frequency 09/26/2021   Atypical facial pain (1ry area of Pain) (Left) 08/29/2021   Chronic ear pain (Left) 08/29/2021   Temporal headache (Left) 08/29/2021   Inner ear pain (Left) 08/29/2021   Trigeminal nerve disorder (Left) 08/29/2021   History of photophobia 08/29/2021   TMJ (temporomandibular joint syndrome) 08/29/2021   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   Polypharmacy 08/29/2021   Severe frontal headaches 08/29/2021   Chronic occipital headache (Midline) (2ry area of Pain) 08/29/2021   Chronic neck pain (posterior) (4th area of Pain) (Bilateral) (L>R) 08/29/2021   Musculoskeletal disorder involving upper  trapezius muscle 08/29/2021   Musculoskeletal disorder involving sternocleidomastoid (Left) 08/29/2021   Abnormal MRI, cervical spine (02/11/2019) 08/29/2021   Shoulder stiffness (Right) 08/29/2021   Pharmacologic therapy 08/27/2021   Disorder of skeletal system 08/27/2021   Problems influencing health status 08/27/2021   GAD (generalized anxiety disorder) 07/17/2021   Difficulty walking 07/17/2021   Headache disorder (3ry area of Pain) 07/17/2021   Photophobia 07/17/2021   Difficulty with speech 07/17/2021   Tear film insufficiency 07/15/2021   Left lower quadrant abdominal pain 07/05/2021   Palpitations 06/11/2021   Axillary lymphadenopathy 06/11/2021   Immunosuppressed status (HCC) 04/19/2021   History of seronegative inflammatory arthritis 06/28/2020   Osteopenia of multiple sites 06/14/2020   Polyarthralgia 06/12/2020   Iodine deficiency 06/12/2020   OSA on CPAP 06/12/2020   Chronic pain syndrome 06/12/2020   Psychophysiological insomnia 11/26/2019   Calcific tendinitis of shoulder (Right) 04/10/2019   History of iron  deficiency anemia 11/04/2018   History of basal cell carcinoma 10/07/2018   Vaginal dryness 09/07/2018   Chronic jaw pain (Left) 07/15/2018   Chronic migraine with aura 07/15/2018   Ehlers-Danlos syndrome, type 3 07/15/2018   Rectocele 05/21/2018   Ehlers-Danlos syndrome 03/15/2018   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   Cardiac arrhythmia 01/21/2018   Polyp of colon 01/21/2018   Chronic constipation 01/21/2018   Instability of shoulder joint (Right) 01/12/2018   Trigeminal neuralgia (V1-3) (Left) 12/25/2017   Instability of shoulder joint (Left) 12/25/2017   Nausea 12/25/2017   Occipital neuralgia 12/25/2017   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   Hiatal hernia 10/14/2017   TMJ (dislocation of temporomandibular joint) 09/29/2017   Irritable bowel syndrome 07/02/2017   Anal fissure 07/02/2017   Anemia  07/02/2017   Gastroesophageal reflux disease 07/02/2017   Hemorrhoids 07/02/2017   Malignant neoplasm of skin 07/02/2017   Malignant tumor of colon (HCC) 07/02/2017   Arthritis 07/02/2017   Fibromyalgia 07/02/2017   Pulsatile tinnitus of ear (Left) 12/12/2016   Restless leg syndrome 08/01/2016   Cervical dystonia 03/21/2016   Cervical spine disease 03/21/2016   Cervicalgia 02/06/2016   Paresthesia 02/06/2016   Hypercholesterolemia 12/24/2015   Iron  deficiency anemia 10/18/2015   Vitamin D  deficiency disease 10/18/2015   Midline low back pain without sciatica 02/06/2015   Hyperglycemia 10/30/2014   History of colon cancer 09/27/2014   Postural orthostatic tachycardia syndrome 09/16/2003   Chronic fatigue 09/15/2000   Asthma 09/15/1988   Dyspnea 09/15/1988    ONSET DATE: 11/03/23  REFERRING DIAG: R wrist fx with numbness and pain   THERAPY DIAG:  Pain in right hand  Pain in right wrist  Stiffness of right hand, not elsewhere classified  Stiffness of right wrist, not elsewhere classified  Muscle weakness (generalized)  Rationale for Evaluation and Treatment: Rehabilitation  SUBJECTIVE:   SUBJECT:  I had my MRI this morning.  I was  thinking about you asking about nerve pain.  I do think I have nerve pain going into my pinky and that side of my hand  Pt accompanied by: self  PERTINENT HISTORY: 01/13/24 ortho note Dr Sharrie  I personally reviewed and visualized the imaging studies if available.  - EMG/NCS RUE = evaluate for any acute nerve injury from her distal radius fracture causing her reported symptoms  Assessment:   ICD-10-CM  1. Closed Colles' fracture of right radius, initial encounter S52.531A  2. Stiffness of right wrist joint M25.631  3. Numbness and tingling in right hand R20.0  R20.2   Plan:   I have discussed the nature of her current subjective complaints, clinical examination, test results and have reviewed treatment options. The plan is to  do the following;  - The patient has ongoing right wrist pain, swelling, decreased motion, hand numbness and tingling, difficulty with hand function after a distal radius fracture on 11/03/2023. She seems to have been treated appropriately with splint followed by cast followed by brace then referred to occupational therapy. Some of her symptoms could simply be due to ordinary sequela I of injury and recovery has joint stiffness could be prolonged for up to 6-12 months. She could be developing carpal tunnel syndrome which can occur with a distal radius fracture as well. I am unsure about her ulnar sided fingers issue? This could be some nerve injury versus biomechanical abnormality because of significant loss to wrist motion versus developing complex regional pain syndrome. - She has had recent x-rays that show healing to her nondisplaced, extra-articular distal radius fracture. There is some osteopenia surrounding the joint which could be from disuse/immobilization. She has significant decreased her wrist motion. Pain limits a lot of examination. I discussed the normal recovery after distal radius fracture and how some of the things she is feeling can occur. Because of the complexity of her past medical history and her reported issues that seem neurologic, I recommended EMG ASAP to evaluate for any nerve damage. I also referred her to a different occupational therapist to see if they can provide any different treatments. Depending on the EMG results, she may need referral to neurosurgery versus hand surgery specialist versus general orthopedic surgeon. - Daily activity as tolerated. Modify activity as needed according to symptoms. No limitations to pain-free weight bearing. No need for immobilization or bracing. - Refer to OT for pain relieving modalities, manual techniques as indicated, home exercise planning. Continue home exercise program to maintain strength, flexibility, and endurance. - Use chronic  medications, over-the-counter medicines, topical diclofenac /pain cream, relative rest, compression, massage, and ice/heat as needed for pain.  - Follow up depending on EMG results. I did discuss that we could consider carpal tunnel steroid injection as a possible treatment.   PRECAUTIONS: none     WEIGHT BEARING RESTRICTIONS: No  PAIN:  Are you having pain?  Coming in 4/10 pisiforme and thumb MC FALLS: Has patient fallen in last 6 months? ?  LIVING ENVIRONMENT: Lives with: lives with their spouse    PLOF: on disability -patient occupation was International aid/development worker.  Because of her medical history she does not participate in cooking and laundry and cleaning.  But is active around the house.  Likes to walk.  On the computer and phone. Patient did prior to injury had pain at the thumb.  And at the carpal bones at times.  Appear active range of motion was within functional limits   PATIENT GOALS: I want the pain better my  right hand and wrist and get my motion and strength back so I can use my hand  NEXT MD VISIT: ?  OBJECTIVE:  Note: Objective measures were completed at Evaluation unless otherwise noted.  HAND DOMINANCE: Right  ADLs: Severe difficulty using right hand. Unable to grip, pull or push or hold or carry.  Limited by severe wrist stiffness.   FUNCTIONAL OUTCOME MEASURES: Next session  UPPER EXTREMITY ROM:     Active ROM Right eval Left eval R 02/11/24 R 02/15/24 R 02/23/24 R 03/07/24 R 03/10/24 R 03/17/24 R 04/05/24 R 04/07/24  Shoulder flexion            Shoulder abduction            Shoulder adduction            Shoulder extension            Shoulder internal rotation            Shoulder external rotation            Elbow flexion            Elbow extension            Wrist flexion 25  35 40 50 55   60 close fist 55 60 open hand and loose fist t  Wrist extension 5  10 20 15  close fist 30 20 15  open hand, 0 close fist  10 close fist , open hand 20 15 close fist and 20  open hand 15  Wrist ulnar deviation 10  15 18  20   25  35  Wrist radial deviation 5  8 12  10   18 18   Wrist pronation 90  90 90 90   90    Wrist supination 63  60 60 70 70  65 78 80  (Blank rows = not tested)  Active ROM Right eval Left eval R 02/11/24 R 02/15/24 R 02/23/24 R 04/05/24  Thumb MCP (0-60) 50    55 55  Thumb IP (0-80) 50    50 65  Thumb Radial abd/add (0-55) 45    45  48  Thumb Palmar abd/add (0-45) 50    50  50  Thumb Opposition to Small Finger Opposition to all digits but increased pain 3rd through 5th    Opposition to all digits    Index MCP (0-90) 65   70 65 75   Index PIP (0-100) 80   90 95    Index DIP (0-70)          Long MCP (0-90)  70   75 85    Long PIP (0-100)  90   95 95    Long DIP (0-70)          Ring MCP (0-90)  70   80 90    Ring PIP (0-100)  90   95 100    Ring DIP (0-70)          Little MCP (0-90)  75   90 95    Little PIP (0-100)  90   90 100    Little DIP (0-70)          (Blank rows = not tested)  HAND FUNCTION:  02/23/24  Grip strength: Right: 8 lbs; Left: 39 lbs, Lateral pinch: Right: 1 lbs, Left: 11 lbs, and 3 point pinch: Right: 2 lbs, Left: 11 lbs if providing stability at thumb CMC had 7 lbs lat pinch and 4 3  point pinch  02/29/24 Grip strength: Right: 8 lbs; Left: 39 lbs, Lateral pinch: Right: 5 lbs, Left: 11 lbs, and 3 point pinch: Right: 3 lbs, Left: 11 lbs 03/07/24 Grip strength: NT  Lateral pinch: Right: 6 oval 8 custom lbs, Left: 11 lbs, and 3 point pinch: Right: 4 Oval 8 custom lbs, Left: 11 lbs  03/10/24 Grip strength: R 17 with oval 8 at thumb , L 41lbs  Lateral pinch: Right: 8 oval 8 custom lbs, Left: 11 lbs, and 3 point pinch: Right: 5 Oval 8 custom lbs, Left: 11 lbs 03/17/24 Grip strength: R10 with oval 8 at thumb pain palm , L 41lbs  Lateral pinch: Right: 10 oval 8 custom lbs, Left: 11 lbs, and 3 point pinch: Right: 6 Oval 8 custom lbs, Left: 11 lbs 04/05/24 Grip strength: R10 with oval 8 at thumb pain palm , L 41lbs  Lateral pinch:  Right: 8 oval 8 custom lbs, Left: 9 lbs, and 3 point pinch: Right: 6 Oval 8 custom lbs, Left: 11 lbs  COORDINATION: Impaired because of severe stiffness in right hand digits and wrist   SENSATION: Patient report nerve conduction showed minimal carpal tunnel. Will further assess   NO denies numbness or sensory issue    EDEMA: Appeared to have some edema in the digits as well as the hand.  COGNITION: Overall cognitive status: Within functional limits for tasks assessed     TREATMENT DATE: 04/21/24    Patient arrived with reports of some nerve pain or numbness or burning pain in the pinky. In the past patient denied nerve pain.  Had carpal tunnel.  Symptoms of carpal tunnel negative Upon further assessment appear patient has diminished sensation with burning pain in the fifth digit as well as lateral side of fourth. Tenderness over Guyons canal and pisiforme-as well as some edema.  Upon further assessment patient also tender over the cubital tunnel as well as positive Tinel. Reviewed with patient positioning and modifications.  Appear patient may be cradled hand since her fracture.  As well as sleeping.  In the past. Also could have propped up elbow in the past.  Patient had her MRI this morning. Recommended for her to discuss ulnar nerve involvement with Dr. Sharrie.  When getting the results from the MRI.     No manual therapy none today  manual therapy patient with out compression glove OT performed joint mobilization at carpal rows.  With stabilization at 2nd, 3rd and 4th metacarpal base and distal radial ulnar joint light traction with joint mobilization.  10 reps each base of metacarpal Followed by circular joint mobilization to the ulnar side as well as radial side with facilitation of gentle flexion, extension and radial ulnar deviation.  Patient tolerated very well. Reviewed again with husband doing joint mobilization of the wrist hands on as well as demonstrated with  feedback from patient and OT. Repeated also with Isotoner glove on. Patient showed great progress and ulnar deviation.  As well as flexion.  Extension still limited as well as radial deviation.    Providing lateral stability at thumb metacarpal  This date custom fabricated patient hand-based MC block splint with CMC and IP free. Allowing opposition as well as lateral pinch as well as palmar abduction. With splint on patient was able to turn a doorknob as well as opening a door with a lever. Patient was able to pick up ultrasound gel bottle as well as opening a flip top. Patient do report at the end after simulating  functional task with splints some discomfort and pain at the thumb MCP. Discussed with patient that the splint provides MC stabilization to allow patient to do some functional strengthening. Expect patient to have some pain with activity but decreasing the risk for subluxing. Patient to use 2 times a day 3 times a day for 10 minutes and functional task Like picking up objects taking like blue putty pushing down pulling it up, pulling it apart, like 3 point and 2 point pinch Simulated with patient using the splint for eating with a fork as well as stabilizing food with a fork cutting with the left hand the knife. Patient was able to perform. Patient used to eat with a spoon.  Fluidotherapy done for 8 minutes at the end of session to decrease pain at wrist and thumb.  Patient performing gentle wrist and thumb and finger motion.   SABRA                                                                                                                              PATIENT EDUCATION: Education details: findings of eval and HEP  Person educated: Patient Education method: Explanation, Demonstration, Tactile cues, Verbal cues, and Handouts Education comprehension: verbalized understanding, returned demonstration, verbal cues required, and needs further education    GOALS: Goals  reviewed with patient? Yes   LONG TERM GOALS: Target date: 8 wks  Patient and husband to be independent in home program for patient to decrease resting hand pain to less than 5/10. Baseline: No knowledge about splinting or modalities and home exercise program Goal status: Met  2.  Right digits  AROM improve for patient to be able to touch palm Baseline: MC flexion 65 to 75 degrees PIP flexion 80 to 90 degrees with increased pain and increase edema Goal status: Met  3.  Patient pain decreased to less than 3/10 at rest for patient t at wrist to initiate PROM and AAROM  range of motion to wrist flexion extension Baseline: Wrist extension 5 degrees flexion 25.  Pain 2-9/10 at rest  NOW resting pain improved.  But patient cannot tolerate wrist extension as well as supination.  And radial ulnar deviation.  Increased pain at the pisiform I as well as volar wrist Goal status: Progressing  4.  R thumb AROM increase to [facility] for pt to do buttons, write, hold cup  Baseline: Right thumb palmar radial abduction 45 to 50 degrees with pain.  Unable to maintain position.  Wearing tape to support thumb into adduction at base.  Opposition pain to 3rd through 5th. NOW active range of motion for thumb improving in opposition as well as palmar radial abduction.  Do feel some instability with radial and palmar abduction.  As well as flexion with pressure.  She use a custom oval 8 on thumb for lateral stabilization Goal status: Progressing  5.  Right forearm improved for patient to turn doorknobs symptoms.  Baseline: Supination 63 degrees  unable to use forearm NOW limited progress because of increased pain with attempts of range of motion for supination at ulnar wrist and pisiform Goal status: Progressing  6.  Right wrist active and improved for patient to perform 50% of ADLs Baseline: Wrist severely limited radial deviation5 /ulnar deviation 10; extension 5 degrees of flexion 25 with pain NOW patient may progress  in wrist flexion as well as radial ulnar deviation.  Limited in progress in wrist extension and supination by pain Goal status: Progressing  ASSESSMENT:  CLINICAL IMPRESSION: Patient seen  for occupational therapy  for right Colles' fracture on 11/03/23.  Patient had a cast t presented in a prefab splint because a lot of pain.  Patient had  few OT sessions but progress was limited by pain.  Patient referred by Dr. Sharrie with severe stiffness in right dominant hand and wrist as well as pain and numbness.  Patient has history of Ehlers-Denlos syndrome-per patient nerve conduction test showed minimal carpal tunnel.  Patient present at OT evaluation with severe stiffness in wrist in all planes.  As well as pain in the thumb and digits.  Patient reports prior to injury patient active range of motion in right wrist and hand was within functional limits did had some pain in thumb and carpal bones.  Patient hypersensitive and tender to touch as well as soft tissue mobilization.  Unable to tolerate.  NOW- since start of care patient made great progress in digits flexion as well as thumb palmar /radial abduction.  As well as opposition. Pt wearing custom oval 8 splint fabricated by OT with a lateral support on the thumb .-Needed twice modifications but patient with splint on do have great progress in lateral and 3 point pinch.  Provide stability laterally on the thumb with loading. Patient to focus on functional strengthening for prevention and can do light blue putty for gripping.  Patient made great progress in wrist flexion as well as radial ulnar deviation.  But limited in progress for wrist extension -patient had appointment with Dr. Sharrie on 7th July-had a shot at the distal radius carpal joint.  Was able to tolerate joint mobilization with light traction distal carpal rows with stabilization at base of 2nd through 4th metacarpal and distal radial ulnar joint again today able to facilitate wrist flexion  extension as well as radial ulnar deviation.  Husband needed review again to perform at home last visit.  This date fabricated hand-based MC block splint on the volar hand to provide stabilization at the MCP joint leaving the IP and CMC free.  Patient was able to perform functional tasks like using a fork to pinch as well as hold food cutting with the right hand.  Able to turn a doorknob as well as opening a door with a lever.  Patient to use the splint to 3 times a day for 5 1015 minutes and pushing down and pulling light blue putty, pulling apart, light lateral 3-point pinch, functional task under a pound.  For functional strengthening.  Patient had MRI this morning.  Recommended with patient to discuss ulnar nerve involvement.  Patient was asked in the past if have still nerve pain.  Denied it but appear patient has some ulnar nerve involvement that would explain pain over the pisiform he.  Patient present today with decreased sensation in the fifth and lateral fourth with a positive Tinel at the cubital tunnel and tenderness.  Reviewed with patient to avoid cradling hand at the chest or propping  up.   Continue with Isometric strengthening for radial ulnar deviation as well as flexion extension at the wrist in neutral..  Focus on wrist extension progression strengthening isometric at the wrist and thumb.  Patient to use modalities to decrease pain, edema -and increased motion in combination with splint use and medication for pain.  Patient can benefit from skilled OT services to decrease pain and edema and increase motion and strength to return to prior level of function.  PERFORMANCE DEFICITS: in functional skills including ADLs, IADLs, ROM, strength, pain, flexibility, decreased knowledge of use of DME, and UE functional use,   and psychosocial skills including environmental adaptation and routines and behaviors.   IMPAIRMENTS: are limiting patient from ADLs, IADLs, rest and sleep, play, leisure, and  social participation.   COMORBIDITIES: has no other co-morbidities that affects occupational performance. Patient will benefit from skilled OT to address above impairments and improve overall function.  MODIFICATION OR ASSISTANCE TO COMPLETE EVALUATION: No modification of tasks or assist necessary to complete an evaluation.  OT OCCUPATIONAL PROFILE AND HISTORY: Problem focused assessment: Including review of records relating to presenting problem.  CLINICAL DECISION MAKING: LOW - limited treatment options, no task modification necessary  REHAB POTENTIAL: Good for goals  EVALUATION COMPLEXITY: Low     PLAN:  OT FREQUENCY: 1-2x/week  OT DURATION: 10 wks  PLANNED INTERVENTIONS: 97168 OT Re-evaluation, 97535 self care/ADL training, 02889 therapeutic exercise, 97530 therapeutic activity, 97140 manual therapy, 97018 paraffin, 02960 fluidotherapy, 97034 contrast bath, 97033 iontophoresis, passive range of motion, patient/family education, and DME and/or AE instructions    CONSULTED AND AGREED WITH PLAN OF CARE: Patient     Ancel Peters, OTR/L,CLT 04/21/2024, 12:56 PM

## 2024-04-22 ENCOUNTER — Inpatient Hospital Stay: Attending: Oncology

## 2024-04-22 VITALS — BP 107/66 | HR 67 | Temp 98.5°F | Resp 18

## 2024-04-22 DIAGNOSIS — Z87891 Personal history of nicotine dependence: Secondary | ICD-10-CM | POA: Diagnosis not present

## 2024-04-22 DIAGNOSIS — D509 Iron deficiency anemia, unspecified: Secondary | ICD-10-CM | POA: Diagnosis present

## 2024-04-22 DIAGNOSIS — Z862 Personal history of diseases of the blood and blood-forming organs and certain disorders involving the immune mechanism: Secondary | ICD-10-CM

## 2024-04-22 MED ORDER — CETIRIZINE HCL 10 MG/ML IV SOLN
10.0000 mg | Freq: Once | INTRAVENOUS | Status: AC
  Administered 2024-04-22: 10 mg via INTRAVENOUS
  Filled 2024-04-22: qty 1

## 2024-04-22 MED ORDER — SODIUM CHLORIDE 0.9 % IV SOLN
125.0000 mg | Freq: Once | INTRAVENOUS | Status: AC
  Administered 2024-04-22: 125 mg via INTRAVENOUS
  Filled 2024-04-22: qty 125

## 2024-04-24 ENCOUNTER — Encounter: Payer: Self-pay | Admitting: Obstetrics and Gynecology

## 2024-04-25 NOTE — Telephone Encounter (Signed)
**Note De-identified  [place] Obfuscation** Please advise 

## 2024-04-28 ENCOUNTER — Telehealth: Payer: Self-pay | Admitting: Podiatry

## 2024-04-28 ENCOUNTER — Ambulatory Visit: Admitting: Occupational Therapy

## 2024-04-28 NOTE — Telephone Encounter (Signed)
 As per patient, [facility] stated prior authorization is needed. Patient sent email to Dr. Janit on MyChart. Patient contact# [phone]

## 2024-04-29 ENCOUNTER — Inpatient Hospital Stay (HOSPITAL_BASED_OUTPATIENT_CLINIC_OR_DEPARTMENT_OTHER): Payer: Commercial Managed Care - PPO | Admitting: Oncology

## 2024-04-29 ENCOUNTER — Other Ambulatory Visit: Payer: Commercial Managed Care - PPO

## 2024-04-29 ENCOUNTER — Encounter: Payer: Self-pay | Admitting: Oncology

## 2024-04-29 VITALS — BP 118/61 | HR 72 | Temp 97.0°F | Resp 18 | Ht 62.0 in | Wt 141.0 lb

## 2024-04-29 DIAGNOSIS — D509 Iron deficiency anemia, unspecified: Secondary | ICD-10-CM

## 2024-04-29 NOTE — Progress Notes (Signed)
 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  She underwent right hemicolectomy which revealed poorly differentiated invasive adenocarcinoma T3N0.  0 out of 34 lymph nodes were positive.  She received 12 cycles of adjuvant chemotherapy with 5-FU leucovorin.  No oxaliplatin.  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 her father died of colon cancer at the age of 10.  Her only brother has also had colon polyps.  She has therefore been managed as a Lynch syndrome patient despite not having genetic features suggestive of Lynch syndrome.  She took prophylactic aspirin for a while and then she stopped taking it.  She has been getting colonoscopies every 1 to 2 years.  She has also had capsule endoscopy in 2014 for iron  deficiency anemia that was normal.  She has not required any surveillance scans as she has been more than 5 years since her diagnosis of colon cancer. She has established care with Dr. Unk for this.   Other than that patient has a history of basal cell skin cancer x2 and is in the process of finding a dermatologist.  She was also getting yearly pelvic exams and pelvic ultrasound to screen for GYN cancers.  Patient has a history of mast cell activation syndrome and often needs Benadryl  before taking oral iron .  Also reports that she has dysautonomia and pots for which she sees cardiology. Also has a h/o Ehler Danlos  syndrome   Currently patient has an ongoing migraine attack.  She is wearing dark glasses and a cat and unable to see eye to eye.  She denies any blood in her stool or urine presently.  Denies any dark melanotic stools.  Her last IV iron  was about 6 months ago and she does not remember what type of IV iron  she has received.    She sees Dr. Vicie from [facility] for mast cell activation syndrome    Interval history-patient is concerned about a possible swelling under her right armpit.  She has had this evaluated years ago as well with an ultrasound and it was unremarkable.  She is also concerned about the fact that she is requiring iron  infusions more frequently at this time  ECOG PS- 1 Pain scale- 0   Review of systems- Review of Systems  Constitutional:  Positive for malaise/fatigue. Negative for chills, fever and weight loss.  HENT:  Negative for congestion, ear discharge and nosebleeds.   Eyes:  Negative for blurred vision.  Respiratory:  Negative for cough, hemoptysis, sputum production, shortness of breath and wheezing.   Cardiovascular:  Positive for palpitations. Negative for chest pain, orthopnea and claudication.  Gastrointestinal:  Negative for abdominal pain, blood in stool, constipation, diarrhea, heartburn, melena, nausea and vomiting.  Genitourinary:  Negative for dysuria, flank pain, frequency, hematuria and urgency.  Musculoskeletal:  Positive for back pain and joint pain. Negative for myalgias.  Skin:  Negative for rash.  Neurological:  Positive for sensory change (Right ulnar neuropathy). Negative for dizziness, tingling, focal  weakness, seizures, weakness and headaches.  Endo/Heme/Allergies:  Bruises/bleeds easily.  Psychiatric/Behavioral:  Negative for depression and suicidal ideas. The patient does not have insomnia.       Allergies  Allergen Reactions   Iodinated Contrast Media Hives, Itching, Palpitations, Photosensitivity and Shortness Of Breath    Other reaction(s):  Headache Other reaction(s): Headache   Ioversol Anxiety, Hives, Itching and Palpitations   Ciprofloxacin Other (See Comments)   Levofloxacin Other (See Comments)    Congestion & Headaches Congestion & Headaches    Other Other (See Comments)    Novocaine (IV) form Hearing loss Novocaine (IV) form Hearing loss  Congestion & Headaches Congestion & Headaches Congestion & Headaches Congestion & Headaches  Other reaction(s): Unknown   Soy Allergy (Obsolete)     Other reaction(s): Other (See Comments), Unknown Congestion & Headaches Congestion & Headaches   Soybean-Containing Drug Products Other (See Comments)    Congestion & Headaches    Bee Pollen    Bupropion    Celebrex  [Celecoxib ]    Codeine    Dexilant [Dexlansoprazole]    Doxepin     Other Reaction(s): Unknown   Dulera [Mometasone Furo-Formoterol Fum]    Fentanyl    Fluticasone     Inhaled into lungs it does not do well   Gabapentin    Gluten Meal    Histidine    Lidocaine      Rash to topical cream Flushing and redness   Lyrica [Pregabalin]    Mold Extract [Trichophyton]    Morphine And Codeine    Penicillins     Skin testing positive for allergy   Pollen Extract    Polysorbate [Sorbitan]    Prilosec [Omeprazole ]    Rizatriptan  Other (See Comments)    Dizziness, headache, myalgias, nausea   Salmeterol Xinafoate    Silenor [Doxepin Hcl]    Stevioside Other (See Comments)   Tomato    Xylitol Other (See Comments)   Brewer's Dried Yeast [Yeast] Other (See Comments)    Headache, flushing   Ethanol Dermatitis, Other (See Comments), Itching and Palpitations   Hydrocodone Nausea Only   Ibuprofen Nausea And Vomiting    At high doses At high doses    Lanolin Other (See Comments)    Exacerbates neuralgia symptoms Wool causes hives.    Morphine Nausea Only   Wheat Diarrhea   Yeast-Derived Drug Products Other (See Comments)    Headache, flushing     Past Medical History:  Diagnosis Date   Allergy     Anemia    Anxiety    Arthritis    Asthma    Attention deficit hyperactivity disorder (ADHD), combined type 10/18/2015   Diagnosed many years ago.  Has been doing well on Ritalin  5mg  QID to six times a day. Long acting hasn't worked well in the past and she has not wanted to try Adderall. She is taking wellbutrin as well, but she has been weaning medication due to SE migraine.  She is now down to Wellbutrin one quarter tablet daily.  Last Assessment & Plan:  A/P: Stable.  Continue current dosing.  Refilled 3 months   Basal cell carcinoma 05/16/2021   Superficial, R post lower leg. [facility] [place] [place]   Basal cell carcinoma 09/03/2006   L chest, R forearm. 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   Dysautonomia (HCC)    Dysplastic nevus 05/28/2009   right lateral back, [facility] [place], [place]   Dysplastic nevus 01/13/2023   left lower calf, mod to severe, shave removal 03/16/23, margins free 03/16/23   Ehlers-Danlos syndrome type III    Family history of colon cancer    GERD (gastroesophageal reflux disease)    Hyperlipidemia    Idiopathic small fiber peripheral neuropathy    Leiomyoma    Mast cell activation (HCC)    Migraines    Neck pain    Occipital neuralgia    Sleep apnea      Past Surgical History:  Procedure Laterality Date   APPENDECTOMY     removed with colectomy   BREAST SURGERY     reduction   COLON SURGERY  2008   for colon cancer, ~1/3 of colon removed, primary reanastimosis   COLONOSCOPY WITH PROPOFOL  N/A 10/07/2018   Procedure: COLONOSCOPY WITH PROPOFOL ;  Surgeon: Unk Corinn Skiff, MD;  Location: [facility] ENDOSCOPY;  Service: Gastroenterology;  Laterality: N/A;   COLONOSCOPY WITH PROPOFOL  N/A 08/14/2021   Procedure: COLONOSCOPY WITH PROPOFOL ;  Surgeon: Unk Corinn Skiff, MD;  Location: [facility] ENDOSCOPY;  Service: Gastroenterology;  Laterality: N/A;   ENDOMETRIAL ABLATION     trigeminal stimulator      Social History   Socioeconomic History   Marital status: Married    Spouse name: Not on file   Number of children: 0   Years of education: Not on file   Highest education level: Master's degree (e.g., MA, MS, MEng, MEd, MSW, MBA)  Occupational History   Occupation: SSI and disability  Tobacco Use   Smoking status: Former    Current packs/day: 0.00    Average packs/day: 0.1 packs/day for 5.0 years (0.5 ttl pk-yrs)    Types: Cigarettes    Quit date: 09/15/1994    Years since quitting: 29.6   Smokeless tobacco: Never   Tobacco comments:    Smoked a few cigarettes in high school and college, but never a regular smoker  Vaping Use   Vaping status: Never Used  Substance and Sexual Activity   Alcohol use: Not Currently   Drug use: Not Currently    Types: Marijuana    Comment: Marijuana occasionally in my 20's   Sexual activity: Not Currently    Birth control/protection: Abstinence, Post-menopausal  Other Topics Concern   Not on file  Social History Narrative   Not on file   Social Drivers of Health   Financial Resource Strain: Low Risk  (03/21/2024)   Received from [facility]   Overall Financial Resource Strain (CARDIA)    Difficulty of Paying Living Expenses: Not hard at all  Food Insecurity: No Food Insecurity (03/21/2024)   Received from [facility]   Hunger Vital Sign    Within the past 12 months, you worried that your food would run out before you got the money to buy more.: Never true    Within the past 12 months, the food you bought just didn't last and you didn't have money to get more.: Never true  Transportation Needs: Unmet Transportation Needs (03/21/2024)   Received from [facility] - Transportation    In the past 12 months, has lack of transportation kept you  from medical appointments or from getting medications?: Yes    Lack of Transportation (Non-Medical): Yes  Physical Activity: Sufficiently Active (12/21/2023)   Exercise Vital  Sign    Days of Exercise per Week: 7 days    Minutes of Exercise per Session: 30 min  Stress: Stress Concern Present (12/21/2023)   [employer] of Occupational Health - Occupational Stress Questionnaire    Feeling of Stress : Rather much  Social Connections: Socially Integrated (12/21/2023)   Social Connection and Isolation Panel    Frequency of Communication with Friends and Family: Twice a week    Frequency of Social Gatherings with Friends and Family: Once a week    Attends Religious Services: More than 4 times per year    Active Member of [employer] or Organizations: Yes    Attends Engineer, structural: More than 4 times per year    Marital Status: Married  Catering manager Violence: Not on file    Family History  Problem Relation Age of Onset   Colon cancer Father 54   Cancer Father    Early death Father    Breast cancer Maternal Aunt 50       BRCA neg   Endometrial cancer Maternal Aunt 50   Cancer Maternal Aunt    Obesity Maternal Aunt    Colon cancer Maternal Grandmother 63   Migraines Maternal Grandmother    Tremor Maternal Grandmother    Cancer Maternal Grandmother    Varicose Veins Maternal Grandmother    Leukemia Maternal Grandfather 37   Food Allergy Maternal Grandfather    Cancer Maternal Grandfather    Heart disease Maternal Grandfather    Migraines Mother    Glaucoma Mother    Cataracts Mother    Tremor Mother    Fibromyalgia Mother    Heart disease Mother    ADD / ADHD Mother    Anxiety disorder Mother    Arthritis Mother    Hearing loss Mother    Hyperlipidemia Mother    Hypertension Mother    Varicose Veins Mother    Colon polyps Brother        non-cancerous   ADD / ADHD Brother      Current Outpatient Medications:    acetaminophen  (TYLENOL ) 500 MG tablet, Take 500  mg by mouth every 6 (six) hours as needed., Disp: , Rfl:    albuterol  (PROVENTIL  HFA;VENTOLIN  HFA) 108 (90 Base) MCG/ACT inhaler, Inhale 2 puffs into the lungs every 4 (four) hours as needed. , Disp: , Rfl:    aspirin 325 MG tablet, Take 325 mg by mouth. Every 3 days, Disp: , Rfl:    Atogepant  (QULIPTA ) 10 MG TABS, Take 1 tablet by mouth daily., Disp: 90 tablet, Rfl: 1   azelastine (ASTELIN) 0.1 % nasal spray, Place into both nostrils as needed for rhinitis. Use in each nostril as directed, Disp: , Rfl:    Blood Pressure Monitoring (BLOOD PRESSURE CUFF) MISC, Take blood pressure as needed for symptoms., Disp: , Rfl:    Boswellia Serrata (BOSWELLIA PO), Take 150 mg by mouth every morning. , Disp: , Rfl:    Calcium Citrate 200 MG TABS, Take 600 mg by mouth. powder, Disp: , Rfl:    ciclopirox  (PENLAC ) 8 % solution, Apply topically at bedtime. Apply over nail and surrounding skin. Apply daily over previous coat. After seven (7) days, may remove with alcohol and continue cycle., Disp: 6.6 mL, Rfl: 2   Clotrimazole  1 % LOTN, Apply 1 Application topically daily as needed., Disp: 30 mL, Rfl: 2   clotrimazole -betamethasone  (LOTRISONE ) cream, Apply 1 Application topically daily., Disp: 45 g, Rfl: 2   cromolyn  (GASTROCROM ) 100 MG/5ML  solution, Take by mouth 4 (four) times daily -  before meals and at bedtime., Disp: , Rfl:    Cyanocobalamin (VITAMIN B-12) 1000 MCG SUBL, Place 500 mcg under the tongue daily., Disp: , Rfl:    Diclofenac  Sodium 1.5 % SOLN, Apply topically QID prn, Disp: 150 mL, Rfl: 11   diphenhydrAMINE -Allantoin 2-0.5 % CREA, Apply 1 application topically as needed. , Disp: , Rfl:    docusate sodium (COLACE) 100 MG capsule, Take 100 mg by mouth every other day. , Disp: , Rfl:    Epinastine HCl 0.05 % ophthalmic solution, Apply 1 drop to eye 2 (two) times daily., Disp: , Rfl:    EPINEPHrine  0.3 mg/0.3 mL IJ SOAJ injection, Inject 0.3 mg into the muscle as needed for anaphylaxis. INJECT ONE  AUTO-INJECTOR INTRAMUSCULARLY AS NEEDED, Disp: 2 each, Rfl: 5   estradiol  (ESTRACE ) 0.1 MG/GM vaginal cream, INSERT 1 GRAM VAGINALLY 2  TIMES A WEEK, Disp: 42.5 g, Rfl: 2   famotidine  (PEPCID ) 10 MG tablet, Take 10 mg by mouth daily. , Disp: , Rfl:    fexofenadine (ALLEGRA) 180 MG tablet, Take 180 mg by mouth 2 (two) times daily., Disp: , Rfl:    Folic Acid  (FOLATE PO), Take 0.5 tablets by mouth daily. Folate as calcium foliate 680 mcg DFE, Disp: , Rfl:    guaifenesin (HUMIBID E) 400 MG TABS tablet, Take 400 mg by mouth as needed., Disp: , Rfl:    hydrOXYzine  (ATARAX ) 10 MG tablet, Take 10 mg by mouth 5 (five) times daily as needed for itching (Hives)., Disp: , Rfl:    IODINE EX, Take 225 mcg by mouth. Every 3 days, Disp: , Rfl:    KETAMINE HCL SL, Place into right nostril as needed., Disp: , Rfl:    Ketotifen Fumarate POWD, 0.5 mg by Does not apply route. 6-12 times a day, Disp: , Rfl:    L-Lysine 500 MG CAPS, Take by mouth 3 (three) times daily., Disp: , Rfl:    levocetirizine (XYZAL ) 2.5 MG/5ML solution, as needed., Disp: , Rfl:    Light Mineral Oil-Mineral Oil 0.5-0.5 % EMUL, Apply 1 application to eye daily., Disp: , Rfl:    lipase/protease/amylase (CREON ) 12000-38000 units CPEP capsule, TAKE 1-3 CAPSULES (12,000-36,000 UNITS TOTAL) BY MOUTH 3 (THREE) TIMES DAILY BEFORE MEALS. PATIENT MAY TAKE AN ADDITIONAL 3 CAPSULES PER DAY FOR SNACK., Disp: 1100 capsule, Rfl: 11   Magnesium Citrate POWD, Take 100 mg by mouth daily., Disp: , Rfl:    Melatonin 1 MG/ML LIQD, Take 1 mg by mouth daily as needed., Disp: , Rfl:    Menatetrenone (VITAMIN K2) 100 MCG TABS, 100 mcg daily., Disp: , Rfl:    montelukast  (SINGULAIR ) 5 MG chewable tablet, Chew 10 mg by mouth daily at 2 PM., Disp: , Rfl:    Non Gelatin Capsules, Empty, (CAPSULE #3 CLEAR/CLEAR VEG) CAPS, Take 1 capsule by mouth daily., Disp: , Rfl:    NONFORMULARY OR COMPOUNDED ITEM, Bupivacaine  nasal 0.5% drops w/o epi, Disp: , Rfl:    omega-3 acid ethyl  esters (LOVAZA ) 1 g capsule, Take 1 capsule (1 g total) by mouth daily., Disp: 90 capsule, Rfl: 3   PHOSPHATIDYL CHOLINE PO, Take 1 tablet by mouth daily., Disp: , Rfl:    Phytonadione  (VITAMIN K1 ) POWD, 100 mcg daily., Disp: , Rfl:    pyridostigmine  (MESTINON ) 60 MG/5ML solution, Take 60 mg by mouth 5 (five) times daily. Patient is taking 7.53ml, Disp: , Rfl:    Riboflavin (VITAMIN B-2 PO), Take  100 mg by mouth 3 (three) times daily., Disp: , Rfl:    Selenium 200 MCG CAPS, Take 1 capsule by mouth. Twice a month, Disp: , Rfl:    simethicone (MYLICON) 80 MG chewable tablet, Chew 80 mg by mouth every 6 (six) hours as needed for flatulence., Disp: , Rfl:    Sodium Fluoride  1.1 % PSTE, Place onto teeth., Disp: , Rfl:    Suvorexant  5 MG TABS, Take 5 mg by mouth., Disp: , Rfl:    Terbinafine  HCl POWD, APPLY 1 APPLICATION TOPICALLY DAILY. APPLY TO NAILS DAILY, Disp: 15 g, Rfl: 2   trolamine salicylate (ASPERCREME/ALOE) 10 % cream, Apply topically., Disp: , Rfl:    Ubrogepant  (UBRELVY ) 50 MG TABS, Take 1 tablet (50 mg total) by mouth as needed (for migriane). May repeat a dose in 2 hours if headache persists, Disp: 16 tablet, Rfl: 6   vitamin C (ASCORBIC ACID) 500 MG tablet, Take 500 mg by mouth daily. With calcium, Disp: , Rfl:    Vitamin D , Cholecalciferol, 10 MCG (400 UNIT) TABS, Take 1,000 Int'l Units/day by mouth daily., Disp: , Rfl:    ZORYVE  0.3 % CREA, APPLY TO ELBOWS ONCE DAILY., Disp: 60 g, Rfl: 2  Physical exam:  Vitals:   04/29/24 1307  BP: 118/61  Pulse: 72  Resp: 18  Temp: (!) 97 F (36.1 C)  TempSrc: Tympanic  SpO2: 100%  Weight: 141 lb (64 kg)  Height: 5' 2 (1.575 m)   Physical Exam Cardiovascular:     Rate and Rhythm: Normal rate and regular rhythm.     Heart sounds: Normal heart sounds.  Pulmonary:     Effort: Pulmonary effort is normal.     Breath sounds: Normal breath sounds.  Skin:    General: Skin is warm and dry.  Neurological:     Mental Status: She is alert and  oriented to person, place, and time.   Lymph node exam: No palpable right axillary adenopathy at the area of concern.  There is an ill-defined prominence there which could be soft tissue related  I have personally reviewed labs listed below:    Latest Ref Rng & Units 12/21/2023   11:29 AM  CMP  Glucose 70 - 99 mg/dL 899   BUN 6 - 24 mg/dL 10   Creatinine 9.42 - 1.00 mg/dL 9.41   Sodium 865 - 855 mmol/L 141   Potassium 3.5 - 5.2 mmol/L 3.9   Chloride 96 - 106 mmol/L 102   CO2 20 - 29 mmol/L 25   Calcium 8.7 - 10.2 mg/dL 9.9   Total Protein 6.0 - 8.5 g/dL 6.9   Total Bilirubin 0.0 - 1.2 mg/dL 0.3   Alkaline Phos 44 - 121 IU/L 83   AST 0 - 40 IU/L 28   ALT 0 - 32 IU/L 25       Latest Ref Rng & Units 04/11/2024   10:05 AM  CBC  WBC 4.0 - 10.5 K/uL 6.3   Hemoglobin 12.0 - 15.0 g/dL 87.6   Hematocrit 63.9 - 46.0 % 38.0   Platelets 150 - 400 K/uL 271    I have personally reviewed Radiology images listed below: No images are attached to the encounter.  MR WRIST RIGHT WO CONTRAST Result Date: 04/26/2024 EXAM DESCRIPTION: MR WRIST RIGHT WO CONTRAST CLINICAL HISTORY: 52 years, Female, pain. COMPARISON: None TECHNIQUE: MRI of the wrist was performed with multiplanar multi sequence imaging according to our usual protocol. FINDINGS: There is a remote healed nondisplaced  distal radius fracture. No acute fracture. No erosions. Mild radiocarpal osteoarthritis with mild joint space narrowing and mild chondrolysis. No significant degenerative edema. The marrow signal is unremarkable. The alignment is unremarkable. No significant joint effusion. The carpal tunnel and flexor tendons are unremarkable. The extensor tendons and visualized musculature are unremarkable. The ligaments are intact. IMPRESSION: No subacute bony or soft tissue injury. Mild radiocarpal osteoarthritis. Remote healed distal radius fracture. Electronically signed by: Reyes Frees MD 04/26/2024 06:30 PM EDT RP Workstation:  [id number]     Assessment and plan- Patient is a 53 y.o. female who is here for follow-up of following issues:  Iron  deficiency anemia: She is currently receiving Ferrlecit  infusions x 3 every 2 weeks and will receive her second dose next week with cetirizine  premedication.Patient is concerned that she required her next dose of IV iron  5 months after her previous dosing.  She has a prior history of colon cancer and is on a colonoscopy protocol every 5 years.  Last colonoscopy 3 years ago.  We discussed that we could get a CT abdomen to evaluate her iron  deficiency but this ideally needs to be done with IV contrast.  Patient has had hives to IV contrast previously and we will need to premedicate her with prednisone  and Benadryl  if we are doing it with contrast.  She would like to think about it and let me know.  CBC ferritin and iron  studies in 3 and 6 months and I will see her back in 6 months.  I will also check B12 in 6 months  Right axillary swelling: This does not appear to be a palpable adenopathy.  There is soft tissue prominence in that area.  Patient will be discussing this with her orthopedic physician further before considering an ultrasound and will let us  know if she wants us  to order that   Visit Diagnosis 1. Iron  deficiency anemia, unspecified iron  deficiency anemia type      Dr. Annah Skene, MD, MPH [facility] at [facility] [phone] 04/29/2024 1:34 PM

## 2024-04-29 NOTE — Progress Notes (Signed)
 Patient is worried about iron  levels and also wants Dr. Melanee to feel a spot under her right arm.

## 2024-04-30 ENCOUNTER — Encounter: Payer: Self-pay | Admitting: Oncology

## 2024-04-30 DIAGNOSIS — C189 Malignant neoplasm of colon, unspecified: Secondary | ICD-10-CM

## 2024-05-02 ENCOUNTER — Ambulatory Visit: Admitting: Occupational Therapy

## 2024-05-02 ENCOUNTER — Other Ambulatory Visit: Payer: Self-pay | Admitting: Obstetrics and Gynecology

## 2024-05-02 DIAGNOSIS — M25631 Stiffness of right wrist, not elsewhere classified: Secondary | ICD-10-CM

## 2024-05-02 DIAGNOSIS — M6281 Muscle weakness (generalized): Secondary | ICD-10-CM

## 2024-05-02 DIAGNOSIS — M25641 Stiffness of right hand, not elsewhere classified: Secondary | ICD-10-CM

## 2024-05-02 DIAGNOSIS — M79641 Pain in right hand: Secondary | ICD-10-CM

## 2024-05-02 DIAGNOSIS — M25531 Pain in right wrist: Secondary | ICD-10-CM

## 2024-05-02 MED ORDER — ESTRADIOL 10 MCG VA TABS: 10.0000 ug | ORAL_TABLET | VAGINAL | 2 refills | Status: AC

## 2024-05-02 NOTE — Therapy (Signed)
 OUTPATIENT OCCUPATIONAL THERAPY ORTHO TREATMENT  Patient Name: Autumn Zhang MRN: [id number] DOB:22-May-1971, 53 y.o., female Today's Date: 05/02/2024  PCP: Dr Myrla REFERRING PROVIDER: Dr Sharrie  END OF SESSION:  OT End of Session - 05/02/24 0901     Visit Number 15    Number of Visits 20    Date for OT Re-Evaluation 06/14/24    OT Start Time 0901    OT Stop Time 0955    OT Time Calculation (min) 54 min    Activity Tolerance Patient tolerated treatment well;Patient limited by pain    Behavior During Therapy [facility] for tasks assessed/performed          Past Medical History:  Diagnosis Date   Allergy    Anemia    Anxiety    Arthritis    Asthma    Attention deficit hyperactivity disorder (ADHD), combined type 10/18/2015   Diagnosed many years ago.  Has been doing well on Ritalin  5mg  QID to six times a day. Long acting hasn't worked well in the past and she has not wanted to try Adderall. She is taking wellbutrin as well, but she has been weaning medication due to SE migraine.  She is now down to Wellbutrin one quarter tablet daily.  Last Assessment & Plan:  A/P: Stable.  Continue current dosing.  Refilled 3 months   Basal cell carcinoma 05/16/2021   Superficial, R post lower leg. [facility] [place], [place]   Basal cell carcinoma 09/03/2006   L chest, R forearm. 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   Dysautonomia (HCC)    Dysplastic nevus 05/28/2009   right lateral back, [facility] [place], [place]   Dysplastic nevus 01/13/2023   left lower calf, mod to severe, shave removal 03/16/23, margins free 03/16/23   Ehlers-Danlos syndrome type III    Family history of colon cancer    GERD (gastroesophageal reflux disease)    Hyperlipidemia    Idiopathic small fiber peripheral  neuropathy    Leiomyoma    Mast cell activation (HCC)    Migraines    Neck pain    Occipital neuralgia    Sleep apnea    Past Surgical History:  Procedure Laterality Date   APPENDECTOMY     removed with colectomy   BREAST SURGERY     reduction   COLON SURGERY  2008   for colon cancer, ~1/3 of colon removed, primary reanastimosis   COLONOSCOPY WITH PROPOFOL  N/A 10/07/2018   Procedure: COLONOSCOPY WITH PROPOFOL ;  Surgeon: Unk Corinn Skiff, MD;  Location: [facility] ENDOSCOPY;  Service: Gastroenterology;  Laterality: N/A;   COLONOSCOPY WITH PROPOFOL  N/A 08/14/2021   Procedure: COLONOSCOPY WITH PROPOFOL ;  Surgeon: Unk Corinn Skiff, MD;  Location: [facility] ENDOSCOPY;  Service: Gastroenterology;  Laterality: N/A;   ENDOMETRIAL ABLATION     trigeminal stimulator     Patient Active Problem List   Diagnosis Date Noted   Psoriatic arthritis (HCC) 12/21/2023   MTHFR mutation 12/21/2023   Lymphedema 04/28/2023   Facial swelling 04/13/2023   Liver cyst 06/24/2022   Decreased appetite 06/24/2022   Generalized abdominal pain 06/24/2022   Abnormal weight gain 02/03/2022   Central obesity 02/03/2022   Impaired fasting glucose 02/03/2022   Cervical spondylosis with radiculopathy 10/09/2021   Psoriasis 10/08/2021   Exocrine pancreatic  insufficiency 09/26/2021   Urinary frequency 09/26/2021   Atypical facial pain (1ry area of Pain) (Left) 08/29/2021   Chronic ear pain (Left) 08/29/2021   Temporal headache (Left) 08/29/2021   Inner ear pain (Left) 08/29/2021   Trigeminal nerve disorder (Left) 08/29/2021   History of photophobia 08/29/2021   TMJ (temporomandibular joint syndrome) 08/29/2021   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   Polypharmacy 08/29/2021   Severe frontal headaches 08/29/2021   Chronic occipital headache (Midline) (2ry area of Pain) 08/29/2021   Chronic neck pain (posterior) (4th area of Pain) (Bilateral) (L>R) 08/29/2021   Musculoskeletal disorder involving upper  trapezius muscle 08/29/2021   Musculoskeletal disorder involving sternocleidomastoid (Left) 08/29/2021   Abnormal MRI, cervical spine (02/11/2019) 08/29/2021   Shoulder stiffness (Right) 08/29/2021   Pharmacologic therapy 08/27/2021   Disorder of skeletal system 08/27/2021   Problems influencing health status 08/27/2021   GAD (generalized anxiety disorder) 07/17/2021   Difficulty walking 07/17/2021   Headache disorder (3ry area of Pain) 07/17/2021   Photophobia 07/17/2021   Difficulty with speech 07/17/2021   Tear film insufficiency 07/15/2021   Left lower quadrant abdominal pain 07/05/2021   Palpitations 06/11/2021   Axillary lymphadenopathy 06/11/2021   Immunosuppressed status (HCC) 04/19/2021   History of seronegative inflammatory arthritis 06/28/2020   Osteopenia of multiple sites 06/14/2020   Polyarthralgia 06/12/2020   Iodine deficiency 06/12/2020   OSA on CPAP 06/12/2020   Chronic pain syndrome 06/12/2020   Psychophysiological insomnia 11/26/2019   Calcific tendinitis of shoulder (Right) 04/10/2019   History of iron  deficiency anemia 11/04/2018   History of basal cell carcinoma 10/07/2018   Vaginal dryness 09/07/2018   Chronic jaw pain (Left) 07/15/2018   Chronic migraine with aura 07/15/2018   Ehlers-Danlos syndrome, type 3 07/15/2018   Rectocele 05/21/2018   Ehlers-Danlos syndrome 03/15/2018   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   Cardiac arrhythmia 01/21/2018   Polyp of colon 01/21/2018   Chronic constipation 01/21/2018   Instability of shoulder joint (Right) 01/12/2018   Trigeminal neuralgia (V1-3) (Left) 12/25/2017   Instability of shoulder joint (Left) 12/25/2017   Nausea 12/25/2017   Occipital neuralgia 12/25/2017   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   Hiatal hernia 10/14/2017   TMJ (dislocation of temporomandibular joint) 09/29/2017   Irritable bowel syndrome 07/02/2017   Anal fissure 07/02/2017   Anemia  07/02/2017   Gastroesophageal reflux disease 07/02/2017   Hemorrhoids 07/02/2017   Malignant neoplasm of skin 07/02/2017   Malignant tumor of colon (HCC) 07/02/2017   Arthritis 07/02/2017   Fibromyalgia 07/02/2017   Pulsatile tinnitus of ear (Left) 12/12/2016   Restless leg syndrome 08/01/2016   Cervical dystonia 03/21/2016   Cervical spine disease 03/21/2016   Cervicalgia 02/06/2016   Paresthesia 02/06/2016   Hypercholesterolemia 12/24/2015   Iron  deficiency anemia 10/18/2015   Vitamin D  deficiency disease 10/18/2015   Midline low back pain without sciatica 02/06/2015   Hyperglycemia 10/30/2014   History of colon cancer 09/27/2014   Postural orthostatic tachycardia syndrome 09/16/2003   Chronic fatigue 09/15/2000   Asthma 09/15/1988   Dyspnea 09/15/1988    ONSET DATE: 11/03/23  REFERRING DIAG: R wrist fx with numbness and pain   THERAPY DIAG:  Pain in right hand  Pain in right wrist  Stiffness of right hand, not elsewhere classified  Stiffness of right wrist, not elsewhere classified  Muscle weakness (generalized)  Rationale for Evaluation and Treatment: Rehabilitation  SUBJECTIVE:   SUBJECT:  MRI showed arthritis - no really anything - I  want us  to look at splint options for stability for this thumb joint - I see Dr Sharrie [place]- still that numbness in the 5th - from the elbow but sometimes coming from my armpit  Pt accompanied by: self  PERTINENT HISTORY: 01/13/24 ortho note Dr Sharrie  I personally reviewed and visualized the imaging studies if available.  - EMG/NCS RUE = evaluate for any acute nerve injury from her distal radius fracture causing her reported symptoms  Assessment:   ICD-10-CM  1. Closed Colles' fracture of right radius, initial encounter S52.531A  2. Stiffness of right wrist joint M25.631  3. Numbness and tingling in right hand R20.0  R20.2   Plan:   I have discussed the nature of her current subjective complaints, clinical  examination, test results and have reviewed treatment options. The plan is to do the following;  - The patient has ongoing right wrist pain, swelling, decreased motion, hand numbness and tingling, difficulty with hand function after a distal radius fracture on 11/03/2023. She seems to have been treated appropriately with splint followed by cast followed by brace then referred to occupational therapy. Some of her symptoms could simply be due to ordinary sequela I of injury and recovery has joint stiffness could be prolonged for up to 6-12 months. She could be developing carpal tunnel syndrome which can occur with a distal radius fracture as well. I am unsure about her ulnar sided fingers issue? This could be some nerve injury versus biomechanical abnormality because of significant loss to wrist motion versus developing complex regional pain syndrome. - She has had recent x-rays that show healing to her nondisplaced, extra-articular distal radius fracture. There is some osteopenia surrounding the joint which could be from disuse/immobilization. She has significant decreased her wrist motion. Pain limits a lot of examination. I discussed the normal recovery after distal radius fracture and how some of the things she is feeling can occur. Because of the complexity of her past medical history and her reported issues that seem neurologic, I recommended EMG ASAP to evaluate for any nerve damage. I also referred her to a different occupational therapist to see if they can provide any different treatments. Depending on the EMG results, she may need referral to neurosurgery versus hand surgery specialist versus general orthopedic surgeon. - Daily activity as tolerated. Modify activity as needed according to symptoms. No limitations to pain-free weight bearing. No need for immobilization or bracing. - Refer to OT for pain relieving modalities, manual techniques as indicated, home exercise planning. Continue home exercise  program to maintain strength, flexibility, and endurance. - Use chronic medications, over-the-counter medicines, topical diclofenac /pain cream, relative rest, compression, massage, and ice/heat as needed for pain.  - Follow up depending on EMG results. I did discuss that we could consider carpal tunnel steroid injection as a possible treatment.   PRECAUTIONS: none     WEIGHT BEARING RESTRICTIONS: No  PAIN:  Are you having pain? Tenderness 4/10 pisiforme and thumb MC FALLS: Has patient fallen in last 6 months? ?  LIVING ENVIRONMENT: Lives with: lives with their spouse    PLOF: on disability -patient occupation was International aid/development worker.  Because of her medical history she does not participate in cooking and laundry and cleaning.  But is active around the house.  Likes to walk.  On the computer and phone. Patient did prior to injury had pain at the thumb.  And at the carpal bones at times.  Appear active range of motion was within functional limits  PATIENT GOALS: I want the pain better my right hand and wrist and get my motion and strength back so I can use my hand  NEXT MD VISIT: ?  OBJECTIVE:  Note: Objective measures were completed at Evaluation unless otherwise noted.  HAND DOMINANCE: Right  ADLs: Severe difficulty using right hand. Unable to grip, pull or push or hold or carry.  Limited by severe wrist stiffness.   FUNCTIONAL OUTCOME MEASURES: Next session  UPPER EXTREMITY ROM:     Active ROM Right eval Left eval R 02/11/24 R 02/15/24 R 02/23/24 R 03/07/24 R 03/10/24 R 03/17/24 R 04/05/24 R 04/07/24  Shoulder flexion            Shoulder abduction            Shoulder adduction            Shoulder extension            Shoulder internal rotation            Shoulder external rotation            Elbow flexion            Elbow extension            Wrist flexion 25  35 40 50 55   60 close fist 55 60 open hand and loose fist t  Wrist extension 5  10 20 15  close fist 30 20 15   open hand, 0 close fist  10 close fist , open hand 20 15 close fist and 20 open hand 15  Wrist ulnar deviation 10  15 18  20   25  35  Wrist radial deviation 5  8 12  10   18 18   Wrist pronation 90  90 90 90   90    Wrist supination 63  60 60 70 70  65 78 80  (Blank rows = not tested)  Active ROM Right eval Left eval R 02/11/24 R 02/15/24 R 02/23/24 R 04/05/24  Thumb MCP (0-60) 50    55 55  Thumb IP (0-80) 50    50 65  Thumb Radial abd/add (0-55) 45    45  48  Thumb Palmar abd/add (0-45) 50    50  50  Thumb Opposition to Small Finger Opposition to all digits but increased pain 3rd through 5th    Opposition to all digits    Index MCP (0-90) 65   70 65 75   Index PIP (0-100) 80   90 95    Index DIP (0-70)          Long MCP (0-90)  70   75 85    Long PIP (0-100)  90   95 95    Long DIP (0-70)          Ring MCP (0-90)  70   80 90    Ring PIP (0-100)  90   95 100    Ring DIP (0-70)          Little MCP (0-90)  75   90 95    Little PIP (0-100)  90   90 100    Little DIP (0-70)          (Blank rows = not tested)  HAND FUNCTION:  02/23/24  Grip strength: Right: 8 lbs; Left: 39 lbs, Lateral pinch: Right: 1 lbs, Left: 11 lbs, and 3 point pinch: Right: 2 lbs, Left: 11 lbs if providing stability at thumb [facility]  had 7 lbs lat pinch and 4 3 point pinch  02/29/24 Grip strength: Right: 8 lbs; Left: 39 lbs, Lateral pinch: Right: 5 lbs, Left: 11 lbs, and 3 point pinch: Right: 3 lbs, Left: 11 lbs 03/07/24 Grip strength: NT  Lateral pinch: Right: 6 oval 8 custom lbs, Left: 11 lbs, and 3 point pinch: Right: 4 Oval 8 custom lbs, Left: 11 lbs  03/10/24 Grip strength: R 17 with oval 8 at thumb , L 41lbs  Lateral pinch: Right: 8 oval 8 custom lbs, Left: 11 lbs, and 3 point pinch: Right: 5 Oval 8 custom lbs, Left: 11 lbs 03/17/24 Grip strength: R10 with oval 8 at thumb pain palm , L 41lbs  Lateral pinch: Right: 10 oval 8 custom lbs, Left: 11 lbs, and 3 point pinch: Right: 6 Oval 8 custom lbs, Left: 11 lbs 04/05/24 Grip  strength: R10 with oval 8 at thumb pain palm , L 41lbs  Lateral pinch: Right: 8 oval 8 custom lbs, Left: 9 lbs, and 3 point pinch: Right: 6 Oval 8 custom lbs, Left: 11 lbs 05/02/24 Lat pinch with MC block block splint 8 lbs   COORDINATION: Impaired because of severe stiffness in right hand digits and wrist   SENSATION: Patient report nerve conduction showed minimal carpal tunnel. Will further assess   NO denies numbness or sensory issue    EDEMA: Appeared to have some edema in the digits as well as the hand.  COGNITION: Overall cognitive status: Within functional limits for tasks assessed     TREATMENT DATE: 05/02/24    Last visit  pt reports of some nerve pain or numbness or burning pain in the pinky. In the past patient denied nerve pain.  Had carpal tunnel.  Symptoms of carpal tunnel negative Upon further assessment appear patient has diminished sensation with burning pain in the fifth digit as well as lateral side of fourth. Tenderness over Guyons canal and pisiforme-as well as some edema.  Upon further assessment patient also tender over the cubital tunnel as well as positive Tinel. Reviewed with patient positioning and modifications.  Appear patient may be cradled hand since her fracture.  As well as sleeping.  In the past. Also could have propped up elbow in the past.  Patient had her MRI  Recommended for her to discuss ulnar nerve involvement with Dr. Sharrie too -  When getting the results from the MRI.     Pt wanted OT to reassess thumb splint to support laterally more at the MCP patient had oval 8 in the past, MC block splint in the past, kinesiotaping, as well as a proximal oval 8 to support CMC MC.  Not providing patient appropriate support laterally at the MCP for loading and lateral 3-point pinch.  Or causing discomfort or pain. This date fabricated hand-based MC block splint with the middle phalanges volar block but changed the support laterally.  Added the  bridge to the lateral thumb IP joint for stability but still allowing IP flexion Send splint several times in between for correct support, pain, able to do a 3 point lateral pinch, or opposition. With Cica -Care scar pad and moleskin on the dorsal MCP and IP joint Patient able to do lateral pinch 8 pounds.  With less pain Patient was able to open Ziploc bags large and small.  As well as flip top open.       SABRA  PATIENT EDUCATION: Education details: findings of eval and HEP  Person educated: Patient Education method: Explanation, Demonstration, Tactile cues, Verbal cues, and Handouts Education comprehension: verbalized understanding, returned demonstration, verbal cues required, and needs further education    GOALS: Goals reviewed with patient? Yes   LONG TERM GOALS: Target date: 8 wks  Patient and husband to be independent in home program for patient to decrease resting hand pain to less than 5/10. Baseline: No knowledge about splinting or modalities and home exercise program Goal status: Met  2.  Right digits  AROM improve for patient to be able to touch palm Baseline: MC flexion 65 to 75 degrees PIP flexion 80 to 90 degrees with increased pain and increase edema Goal status: Met  3.  Patient pain decreased to less than 3/10 at rest for patient t at wrist to initiate PROM and AAROM  range of motion to wrist flexion extension Baseline: Wrist extension 5 degrees flexion 25.  Pain 2-9/10 at rest  NOW resting pain improved.  But patient cannot tolerate wrist extension as well as supination.  And radial ulnar deviation.  Increased pain at the pisiform I as well as volar wrist Goal status: Progressing  4.  R thumb AROM increase to [facility] for pt to do buttons, write, hold cup  Baseline: Right thumb palmar radial abduction 45 to 50 degrees with pain.  Unable to  maintain position.  Wearing tape to support thumb into adduction at base.  Opposition pain to 3rd through 5th. NOW active range of motion for thumb improving in opposition as well as palmar radial abduction.  Do feel some instability with radial and palmar abduction.  As well as flexion with pressure.  She use a custom oval 8 on thumb for lateral stabilization Goal status: Progressing  5.  Right forearm improved for patient to turn doorknobs symptoms.  Baseline: Supination 63 degrees unable to use forearm NOW limited progress because of increased pain with attempts of range of motion for supination at ulnar wrist and pisiform Goal status: Progressing  6.  Right wrist active and improved for patient to perform 50% of ADLs Baseline: Wrist severely limited radial deviation5 /ulnar deviation 10; extension 5 degrees of flexion 25 with pain NOW patient may progress in wrist flexion as well as radial ulnar deviation.  Limited in progress in wrist extension and supination by pain Goal status: Progressing  ASSESSMENT:  CLINICAL IMPRESSION: Patient seen  for occupational therapy  for right Colles' fracture on 11/03/23.  Patient had a cast t presented in a prefab splint because a lot of pain.  Patient had  few OT sessions but progress was limited by pain.  Patient referred by Dr. Sharrie with severe stiffness in right dominant hand and wrist as well as pain and numbness.  Patient has history of Ehlers-Denlos syndrome-per patient nerve conduction test showed minimal carpal tunnel.  Patient present at OT evaluation with severe stiffness in wrist in all planes.  As well as pain in the thumb and digits.  Patient reports prior to injury patient active range of motion in right wrist and hand was within functional limits did had some pain in thumb and carpal bones.  Patient hypersensitive and tender to touch as well as soft tissue mobilization.  Unable to tolerate.  NOW- since start of care patient made great progress  in digits flexion as well as thumb palmar /radial abduction.  As well as supination pronation as well as ulnar radial deviation since last shot by Dr.  Kubinski.  Patient was wearing  custom oval 8 splint fabricated by OT with a lateral support on the thumb .-Needed twice modifications but patient patient continues to have or increased pain or decreased feeling of stability.  [place] hand-based larger MC block splint but causing some pain reevaluated splinting again today fabricated finger brace/hand-based dorsal MC block splint at the volar middle joint with a lateral support with the bridge to the IP to allow still IP flexion and CMC with a Velcro around the wrist.  Patient had less pain and was able to do 8 pounds of lateral pinch.  Patient to focus on functional strengthening for prevention and can do light blue putty for gripping.   Patient was asked in the past if have still nerve pain.  Denied it but appear patient has some ulnar nerve involvement that would explain pain over the pisiform.  Patient present last visit with decreased sensation in the fifth and lateral fourth with a positive Tinel at the cubital tunnel and tenderness.  Reviewed with patient to avoid cradling hand at the chest or propping up.   Continue with Isometric strengthening for radial ulnar deviation as well as flexion extension at the wrist in neutral..  Focus on wrist extension progression strengthening isometric at the wrist and thumb.  Patient has Wednesday appointment with Dr. Sharrie about continues pain on the ulnar wrist as well as sensory issues.  And results with MRI.?  Possible shot to the ulnar wrist.  Patient to use modalities to decrease pain, edema -and increased motion in combination with splint use and medication for pain.  Patient can benefit from skilled OT services to decrease pain and edema and increase motion and strength to return to prior level of function.  PERFORMANCE DEFICITS: in functional skills including  ADLs, IADLs, ROM, strength, pain, flexibility, decreased knowledge of use of DME, and UE functional use,   and psychosocial skills including environmental adaptation and routines and behaviors.   IMPAIRMENTS: are limiting patient from ADLs, IADLs, rest and sleep, play, leisure, and social participation.   COMORBIDITIES: has no other co-morbidities that affects occupational performance. Patient will benefit from skilled OT to address above impairments and improve overall function.  MODIFICATION OR ASSISTANCE TO COMPLETE EVALUATION: No modification of tasks or assist necessary to complete an evaluation.  OT OCCUPATIONAL PROFILE AND HISTORY: Problem focused assessment: Including review of records relating to presenting problem.  CLINICAL DECISION MAKING: LOW - limited treatment options, no task modification necessary  REHAB POTENTIAL: Good for goals  EVALUATION COMPLEXITY: Low     PLAN:  OT FREQUENCY: 1-2x/week  OT DURATION: 10 wks  PLANNED INTERVENTIONS: 97168 OT Re-evaluation, 97535 self care/ADL training, 02889 therapeutic exercise, 97530 therapeutic activity, 97140 manual therapy, 97018 paraffin, 02960 fluidotherapy, 97034 contrast bath, 97033 iontophoresis, passive range of motion, patient/family education, and DME and/or AE instructions    CONSULTED AND AGREED WITH PLAN OF CARE: Patient     Ancel Peters, OTR/L,CLT 05/02/2024, 10:18 AM

## 2024-05-02 NOTE — Telephone Encounter (Signed)
 We can try to get mri abdomen with and without contrast approved for her since she is allergic to IV dye. H/o colon cancer and ongoing iron  deficiency anemia

## 2024-05-02 NOTE — Progress Notes (Signed)
 Rx vagifem  due to allergic rxn to inactive ingredients in estrace  vaginal crm.

## 2024-05-04 ENCOUNTER — Encounter: Payer: Self-pay | Admitting: Family Medicine

## 2024-05-06 ENCOUNTER — Inpatient Hospital Stay

## 2024-05-06 VITALS — BP 113/77 | HR 69 | Temp 98.5°F | Resp 18

## 2024-05-06 DIAGNOSIS — D509 Iron deficiency anemia, unspecified: Secondary | ICD-10-CM | POA: Diagnosis not present

## 2024-05-06 DIAGNOSIS — Z862 Personal history of diseases of the blood and blood-forming organs and certain disorders involving the immune mechanism: Secondary | ICD-10-CM

## 2024-05-06 MED ORDER — CETIRIZINE HCL 10 MG/ML IV SOLN
10.0000 mg | INTRAVENOUS | Status: DC
  Administered 2024-05-06: 10 mg via INTRAVENOUS
  Filled 2024-05-06: qty 1

## 2024-05-06 MED ORDER — SODIUM CHLORIDE 0.9 % IV SOLN
125.0000 mg | Freq: Once | INTRAVENOUS | Status: AC
  Administered 2024-05-06: 125 mg via INTRAVENOUS
  Filled 2024-05-06: qty 125

## 2024-05-06 MED ORDER — SODIUM CHLORIDE 0.9 % IV SOLN
INTRAVENOUS | Status: DC
  Filled 2024-05-06: qty 250

## 2024-05-06 NOTE — Patient Instructions (Signed)
Sodium Ferric Gluconate Complex Injection What is this medication? SODIUM FERRIC GLUCONATE COMPLEX (SOE dee um FER ik GLOO koe nate KOM pleks) treats low levels of iron (iron deficiency anemia) in people with kidney disease. Iron is a mineral that plays an important role in making red blood cells, which carry oxygen from your lungs to the rest of your body. This medicine may be used for other purposes; ask your health care provider or pharmacist if you have questions. COMMON BRAND NAME(S): Ferrlecit, Nulecit What should I tell my care team before I take this medication? They need to know if you have any of the following conditions: Anemia that is not from iron deficiency High levels of iron in the blood An unusual or allergic reaction to iron, other medications, foods, dyes, or preservatives Pregnant or are trying to become pregnant Breast-feeding How should I use this medication? This medication is injected into a vein. It is given by your care team in a hospital or clinic setting. Talk to your care team about the use of this medication in children. While it may be prescribed for children as young as 6 years for selected conditions, precautions do apply. Overdosage: If you think you have taken too much of this medicine contact a poison control center or emergency room at once. NOTE: This medicine is only for you. Do not share this medicine with others. What if I miss a dose? It is important not to miss your dose. Call your care team if you are unable to keep an appointment. What may interact with this medication? Do not take this medication with any of the following: Deferasirox Deferoxamine Dimercaprol This medication may also interact with the following: Other iron products This list may not describe all possible interactions. Give your health care provider a list of all the medicines, herbs, non-prescription drugs, or dietary supplements you use. Also tell them if you smoke, drink  alcohol, or use illegal drugs. Some items may interact with your medicine. What should I watch for while using this medication? Your condition will be monitored carefully while you are receiving this medication. Visit your care team for regular checks on your progress. You may need blood work while you are taking this medication. What side effects may I notice from receiving this medication? Side effects that you should report to your care team as soon as possible: Allergic reactions--skin rash, itching, hives, swelling of the face, lips, tongue, or throat Low blood pressure--dizziness, feeling faint or lightheaded, blurry vision Shortness of breath Side effects that usually do not require medical attention (report to your care team if they continue or are bothersome): Flushing Headache Joint pain Muscle pain Nausea Pain, redness, or irritation at injection site This list may not describe all possible side effects. Call your doctor for medical advice about side effects. You may report side effects to FDA at 1-800-FDA-1088. Where should I keep my medication? This medication is given in a hospital or clinic and will not be stored at home. NOTE: This sheet is a summary. It may not cover all possible information. If you have questions about this medicine, talk to your doctor, pharmacist, or health care provider.  2024 Elsevier/Gold Standard (2021-01-25 00:00:00)

## 2024-05-09 ENCOUNTER — Encounter: Payer: Self-pay | Admitting: Oncology

## 2024-05-09 ENCOUNTER — Ambulatory Visit: Admitting: Occupational Therapy

## 2024-05-09 NOTE — Telephone Encounter (Addendum)
 Per Dr. Melanee We can try to get mri abdomen with and without contrast approved for her since she is allergic to IV dye. H/o colon cancer and ongoing iron  deficiency anemia.  Order placed in system.  Secure chat sent to [facility] to schedule.  Per Malia Pt is scheduled for 9/3 at 8:00 am.

## 2024-05-11 ENCOUNTER — Other Ambulatory Visit: Payer: Self-pay | Admitting: Podiatry

## 2024-05-17 ENCOUNTER — Other Ambulatory Visit: Payer: Self-pay | Admitting: Podiatry

## 2024-05-17 MED ORDER — NYSTATIN 100000 UNIT/GM EX CREA: 1.0000 | TOPICAL_CREAM | Freq: Two times a day (BID) | CUTANEOUS | 2 refills | Status: AC

## 2024-05-18 ENCOUNTER — Telehealth: Payer: Self-pay | Admitting: Family Medicine

## 2024-05-18 ENCOUNTER — Encounter: Payer: Self-pay | Admitting: Family Medicine

## 2024-05-18 ENCOUNTER — Ambulatory Visit
Admission: RE | Admit: 2024-05-18 | Discharge: 2024-05-18 | Disposition: A | Discharge status: Home / Self Care | Source: Ambulatory Visit | Attending: Oncology | Admitting: Oncology

## 2024-05-18 DIAGNOSIS — C189 Malignant neoplasm of colon, unspecified: Secondary | ICD-10-CM | POA: Diagnosis present

## 2024-05-18 MED ORDER — GADOBUTROL 1 MMOL/ML IV SOLN
6.0000 mL | Freq: Once | INTRAVENOUS | Status: AC | PRN
Start: 2024-05-18 — End: 2024-05-18
  Administered 2024-05-18: 6 mL via INTRAVENOUS

## 2024-05-19 NOTE — Telephone Encounter (Signed)
 Phone call returned to patient about covid vaccine.  No answer and message left to contact [facility] with questions at [phone] Monday through Friday between 8 am to 5 pm. Amadeo Pines, RN

## 2024-05-20 ENCOUNTER — Ambulatory Visit: Payer: Self-pay | Admitting: Oncology

## 2024-05-20 ENCOUNTER — Inpatient Hospital Stay: Attending: Oncology

## 2024-05-20 VITALS — BP 110/66 | HR 65 | Temp 98.2°F | Resp 18

## 2024-05-20 DIAGNOSIS — D509 Iron deficiency anemia, unspecified: Secondary | ICD-10-CM | POA: Diagnosis present

## 2024-05-20 DIAGNOSIS — Z862 Personal history of diseases of the blood and blood-forming organs and certain disorders involving the immune mechanism: Secondary | ICD-10-CM

## 2024-05-20 MED ORDER — CETIRIZINE HCL 10 MG/ML IV SOLN
10.0000 mg | INTRAVENOUS | Status: DC
  Administered 2024-05-20: 10 mg via INTRAVENOUS
  Filled 2024-05-20: qty 1

## 2024-05-20 MED ORDER — SODIUM CHLORIDE 0.9 % IV SOLN
125.0000 mg | Freq: Once | INTRAVENOUS | Status: AC
  Administered 2024-05-20: 125 mg via INTRAVENOUS
  Filled 2024-05-20: qty 10

## 2024-05-20 MED ORDER — SODIUM CHLORIDE 0.9 % IV SOLN
Freq: Once | INTRAVENOUS | Status: AC
  Filled 2024-05-20: qty 250

## 2024-05-20 NOTE — Telephone Encounter (Signed)
 Per Dr. Melanee Please let her know that her MRI abdomen was unremarkable and did not show any concerning findings. She does have evidence of moderate stool burden and needs to work on her bowel regimen to prevent constipation. We can give her our recommendations if she needs help with medications for constipation. Outbound call to patient, informed of above.  Says she's worked with her GI specialist and she is limited on the medications she can take. No further questions / concerns at this time.

## 2024-05-23 ENCOUNTER — Encounter: Admitting: Occupational Therapy

## 2024-05-24 MED ORDER — COVID-19 MRNA VAC-TRIS(PFIZER) 30 MCG/0.3ML IM SUSY: 0.3000 mL | PREFILLED_SYRINGE | Freq: Once | INTRAMUSCULAR | 0 refills | Status: AC

## 2024-05-30 ENCOUNTER — Encounter: Admitting: Occupational Therapy

## 2024-06-23 ENCOUNTER — Encounter: Payer: Self-pay | Admitting: Family Medicine

## 2024-06-23 ENCOUNTER — Ambulatory Visit: Admitting: Family Medicine

## 2024-06-23 VITALS — BP 117/73 | HR 82 | Ht 62.0 in | Wt 144.0 lb

## 2024-06-23 DIAGNOSIS — Z79899 Other long term (current) drug therapy: Secondary | ICD-10-CM | POA: Diagnosis not present

## 2024-06-23 DIAGNOSIS — R739 Hyperglycemia, unspecified: Secondary | ICD-10-CM | POA: Diagnosis not present

## 2024-06-23 DIAGNOSIS — Z0000 Encounter for general adult medical examination without abnormal findings: Secondary | ICD-10-CM

## 2024-06-23 DIAGNOSIS — Z0001 Encounter for general adult medical examination with abnormal findings: Secondary | ICD-10-CM | POA: Diagnosis not present

## 2024-06-23 DIAGNOSIS — E7800 Pure hypercholesterolemia, unspecified: Secondary | ICD-10-CM | POA: Diagnosis not present

## 2024-06-23 DIAGNOSIS — E559 Vitamin D deficiency, unspecified: Secondary | ICD-10-CM

## 2024-06-23 DIAGNOSIS — F411 Generalized anxiety disorder: Secondary | ICD-10-CM

## 2024-06-23 DIAGNOSIS — Z789 Other specified health status: Secondary | ICD-10-CM

## 2024-06-23 DIAGNOSIS — E618 Deficiency of other specified nutrient elements: Secondary | ICD-10-CM

## 2024-06-23 NOTE — Progress Notes (Signed)
 Annual Wellness Visit     Patient: Autumn Zhang, Female    DOB: 03-26-1971, 53 y.o.   MRN: [id number] Visit Date: 06/23/2024  Today's Provider: Jon Eva, MD   Chief Complaint  Patient presents with   Annual Exam    Last completed 06/22/23 Diet -  Varies soft foods and lots of liquid food and other for mass cell activation syndrom Exercise - daily for 20 minutes minimum Feeling - poorly overall  Sleeping - between fairly well and poorly depending on the day Concerns -  gyn is leaving and wanted to know  if you can take over, do you recommend RSV, medicare has told advanced primary management services will be available starting 2026 and would like to know if you will manage that    Subjective    Autumn Zhang is a 53 y.o. female who presents today for her Annual Wellness Visit.  Discussed the use of AI scribe software for clinical note transcription with the patient, who gave verbal consent to proceed.  History of Present Illness   Autumn Zhang is a 53 year old female who presents for a wellness visit and routine screenings.  She requests that her current provider take over her yearly physicals as her OB GYN is leaving. Her Pap smear is up to date and not due until 2029, and her mammogram was completed in August. She inquires about her eligibility for the RSV vaccine, having received her flu and COVID vaccines.  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 Her HSCRP was low and APOB was normal in April. She plans to have her lipid profile and LPA rechecked. She routinely monitors her kidney and liver function, B12, RBC folate, A1c, and thyroid function. Her iron  levels are falling rapidly, and she has a lab appointment scheduled for November 14th.  She takes zinc supplements to aid sleep, finding it more effective than melatonin or sleep medications. She is interested in checking her copper and zinc  levels for balance. She has received the hepatitis B vaccine and plans to check her titer.  She has experienced two falls in the past year, one resulting in a broken arm. She is concerned about her functional status, noting it is stable but not normal compared to others her age.             Medications: Outpatient Medications Prior to Visit  Medication Sig   acetaminophen  (TYLENOL ) 500 MG tablet Take 500 mg by mouth every 6 (six) hours as needed.   albuterol  (PROVENTIL  HFA;VENTOLIN  HFA) 108 (90 Base) MCG/ACT inhaler Inhale 2 puffs into the lungs every 4 (four) hours as needed.    aspirin 325 MG tablet Take 325 mg by mouth. Every 3 days   Atogepant  (QULIPTA ) 10 MG TABS Take 1 tablet by mouth daily.   azelastine (ASTELIN) 0.1 % nasal spray Place into both nostrils as needed for rhinitis. Use in each nostril as directed   Blood Pressure Monitoring (BLOOD PRESSURE CUFF) MISC Take blood pressure as needed for symptoms.   Boswellia Serrata (BOSWELLIA PO) Take 150 mg by mouth every morning.    Calcium Citrate 200 MG TABS Take 600 mg by mouth. powder   cromolyn  (GASTROCROM ) 100 MG/5ML solution Take by mouth 4 (four) times daily -  before meals and at bedtime.   Cyanocobalamin (VITAMIN B-12) 1000 MCG SUBL Place 500 mcg under the tongue daily.  Diclofenac  Sodium 1.5 % SOLN Apply topically QID prn   diphenhydrAMINE -Allantoin 2-0.5 % CREA Apply 1 application topically as needed.    docusate sodium (COLACE) 100 MG capsule Take 100 mg by mouth every other day.    Epinastine HCl 0.05 % ophthalmic solution Apply 1 drop to eye 2 (two) times daily.   EPINEPHrine  0.3 mg/0.3 mL IJ SOAJ injection Inject 0.3 mg into the muscle as needed for anaphylaxis. INJECT ONE AUTO-INJECTOR INTRAMUSCULARLY AS NEEDED   Estradiol  (YUVAFEM ) 10 MCG TABS vaginal tablet Place 1 tablet (10 mcg total) vaginally 2 (two) times a week.   famotidine  (PEPCID ) 10 MG tablet Take 10 mg by mouth daily.    fexofenadine (ALLEGRA) 180 MG  tablet Take 180 mg by mouth 2 (two) times daily.   Folic Acid  (FOLATE PO) Take 0.5 tablets by mouth daily. Folate as calcium foliate 680 mcg DFE   guaifenesin (HUMIBID E) 400 MG TABS tablet Take 400 mg by mouth as needed.   hydrOXYzine  (ATARAX ) 10 MG tablet Take 10 mg by mouth 5 (five) times daily as needed for itching (Hives).   IODINE EX Take 225 mcg by mouth. Every 3 days   KETAMINE HCL SL Place into right nostril as needed.   Ketotifen Fumarate POWD 0.5 mg by Does not apply route. 6-12 times a day   L-Lysine 500 MG CAPS Take by mouth 3 (three) times daily.   levocetirizine (XYZAL ) 2.5 MG/5ML solution as needed.   Light Mineral Oil-Mineral Oil 0.5-0.5 % EMUL Apply 1 application to eye daily.   lipase/protease/amylase (CREON ) 12000-38000 units CPEP capsule TAKE 1-3 CAPSULES (12,000-36,000 UNITS TOTAL) BY MOUTH 3 (THREE) TIMES DAILY BEFORE MEALS. PATIENT MAY TAKE AN ADDITIONAL 3 CAPSULES PER DAY FOR SNACK.   Magnesium Citrate POWD Take 100 mg by mouth daily.   Melatonin 1 MG/ML LIQD Take 1 mg by mouth daily as needed.   Menatetrenone (VITAMIN K2) 100 MCG TABS 100 mcg daily.   montelukast  (SINGULAIR ) 5 MG chewable tablet Chew 10 mg by mouth daily at 2 PM.   Non Gelatin Capsules, Empty, (CAPSULE #3 CLEAR/CLEAR VEG) CAPS Take 1 capsule by mouth daily.   NONFORMULARY OR COMPOUNDED ITEM Bupivacaine  nasal 0.5% drops w/o epi   nystatin  cream (MYCOSTATIN ) Apply 1 Application topically 2 (two) times daily.   omega-3 acid ethyl esters (LOVAZA ) 1 g capsule Take 1 capsule (1 g total) by mouth daily.   PHOSPHATIDYL CHOLINE PO Take 1 tablet by mouth daily.   Phytonadione  (VITAMIN K1 ) POWD 100 mcg daily.   Riboflavin (VITAMIN B-2 PO) Take 100 mg by mouth 3 (three) times daily.   Selenium 200 MCG CAPS Take 1 capsule by mouth. Twice a month   simethicone (MYLICON) 80 MG chewable tablet Chew 80 mg by mouth every 6 (six) hours as needed for flatulence.   Sodium Fluoride  1.1 % PSTE Place onto teeth.    trolamine salicylate (ASPERCREME/ALOE) 10 % cream Apply topically.   Ubrogepant  (UBRELVY ) 50 MG TABS Take 1 tablet (50 mg total) by mouth as needed (for migriane). May repeat a dose in 2 hours if headache persists   vitamin C (ASCORBIC ACID) 500 MG tablet Take 500 mg by mouth daily. With calcium   Vitamin D , Cholecalciferol, 10 MCG (400 UNIT) TABS Take 1,000 Int'l Units/day by mouth daily.   ZORYVE  0.3 % CREA APPLY TO ELBOWS ONCE DAILY.   [DISCONTINUED] ciclopirox  (PENLAC ) 8 % solution Apply topically at bedtime. Apply over nail and surrounding skin. Apply daily over previous coat.  After seven (7) days, may remove with alcohol and continue cycle.   [DISCONTINUED] Clotrimazole  1 % LOTN Apply 1 Application topically daily as needed.   [DISCONTINUED] clotrimazole -betamethasone  (LOTRISONE ) cream Apply 1 Application topically daily.   [DISCONTINUED] pyridostigmine  (MESTINON ) 60 MG/5ML solution Take 60 mg by mouth 5 (five) times daily. Patient is taking 7.78ml   [DISCONTINUED] Suvorexant  5 MG TABS Take 5 mg by mouth. (Patient not taking: Reported on 04/29/2024)   [DISCONTINUED] Terbinafine  HCl POWD APPLY 1 APPLICATION TOPICALLY DAILY. APPLY TO NAILS DAILY   No facility-administered medications prior to visit.    Allergies  Allergen Reactions   Iodinated Contrast Media Hives, Itching, Palpitations, Photosensitivity and Shortness Of Breath    Other reaction(s): Headache Other reaction(s): Headache   Ioversol Anxiety, Hives, Itching and Palpitations   Ciprofloxacin Other (See Comments)   Levofloxacin Other (See Comments)    Congestion & Headaches Congestion & Headaches    Other Other (See Comments)    Novocaine (IV) form Hearing loss Novocaine (IV) form Hearing loss  Congestion & Headaches Congestion & Headaches Congestion & Headaches Congestion & Headaches  Other reaction(s): Unknown   Soy Allergy (Obsolete)     Other reaction(s): Other (See Comments), Unknown Congestion &  Headaches Congestion & Headaches   Soybean-Containing Drug Products Other (See Comments)    Congestion & Headaches    Bee Pollen    Bupropion    Celebrex  [Celecoxib ]    Codeine    Dexilant [Dexlansoprazole]    Doxepin     Other Reaction(s): Unknown   Dulera [Mometasone Furo-Formoterol Fum]    Fentanyl    Fluticasone     Inhaled into lungs it does not do well   Gabapentin    Gluten Meal    Histidine    Lidocaine      Rash to topical cream Flushing and redness   Lyrica [Pregabalin]    Mold Extract [Trichophyton]    Morphine And Codeine    Penicillins     Skin testing positive for allergy   Pollen Extract    Polysorbate [Sorbitan]    Prilosec [Omeprazole ]    Rizatriptan  Other (See Comments)    Dizziness, headache, myalgias, nausea   Salmeterol Xinafoate    Silenor [Doxepin Hcl]    Stevioside Other (See Comments)   Tomato    Xylitol Other (See Comments)   Brewer's Dried Yeast [Yeast (Saccharomyces Cerevisiae)] Other (See Comments)    Headache, flushing   Ethanol Dermatitis, Other (See Comments), Itching and Palpitations   Hydrocodone Nausea Only   Ibuprofen Nausea And Vomiting    At high doses At high doses    Lanolin Other (See Comments)    Exacerbates neuralgia symptoms Wool causes hives.    Morphine Nausea Only   Wheat Diarrhea   Yeast-Derived Drug Products Other (See Comments)    Headache, flushing    Patient Care Team: Myrla Jon HERO, MD as PCP - General (Family Medicine) Melanee Annah BROCKS, MD as Consulting Physician (Oncology)  Review of Systems       Objective    Vitals: BP 117/73 (BP Location: Left Arm, Patient Position: Sitting, Cuff Size: Normal)   Pulse 82   Ht 5' 2 (1.575 m)   Wt 144 lb (65.3 kg)   SpO2 100%   BMI 26.34 kg/m     Physical Exam Vitals reviewed.  Constitutional:      General: She is not in acute distress.    Appearance: Normal appearance. She is well-developed. She is not diaphoretic.  HENT:  Head:  Normocephalic.     Right Ear: Tympanic membrane, ear canal and external ear normal.     Left Ear: Tympanic membrane, ear canal and external ear normal.     Nose: Nose normal.     Mouth/Throat:     Mouth: Mucous membranes are moist.     Pharynx: Oropharynx is clear. No oropharyngeal exudate.  Eyes:     General: No scleral icterus.    Conjunctiva/sclera: Conjunctivae normal.     Pupils: Pupils are equal, round, and reactive to light.  Neck:     Thyroid: No thyromegaly.  Cardiovascular:     Rate and Rhythm: Normal rate and regular rhythm.     Heart sounds: Normal heart sounds. No murmur heard. Pulmonary:     Effort: Pulmonary effort is normal. No respiratory distress.     Breath sounds: Normal breath sounds. No wheezing or rales.  Abdominal:     General: There is no distension.     Palpations: Abdomen is soft.     Tenderness: There is no abdominal tenderness.  Musculoskeletal:        General: No deformity.     Cervical back: Neck supple.     Right lower leg: No edema.     Left lower leg: No edema.  Lymphadenopathy:     Cervical: No cervical adenopathy.  Skin:    General: Skin is warm and dry.     Findings: No rash.  Neurological:     Mental Status: She is alert and oriented to person, place, and time. Mental status is at baseline.  Psychiatric:        Mood and Affect: Mood normal.        Behavior: Behavior normal.        Thought Content: Thought content normal.     Most recent functional status assessment:    06/19/2024    9:14 AM  In your present state of health, do you have any difficulty performing the following activities:  Hearing? 0  Vision? 0  Difficulty concentrating or making decisions? 0  Walking or climbing stairs? 1  Dressing or bathing? 0  Doing errands, shopping? 1  Preparing Food and eating ? Y  Using the Toilet? N  In the past six months, have you accidently leaked urine? Y  Do you have problems with loss of bowel control? N  Managing your  Medications? N  Managing your Finances? N  Housekeeping or managing your Housekeeping? Y   Most recent fall risk assessment:    06/19/2024    9:14 AM  Fall Risk   Falls in the past year? 1  Number falls in past yr: 1  Injury with Fall? 1    Most recent depression screenings:    04/29/2024    1:09 PM 01/26/2024    3:54 PM  PHQ 2/9 Scores  PHQ - 2 Score 0 1  PHQ- 9 Score  8   Most recent cognitive screening:    06/23/2024    9:38 AM  6CIT Screen  What Year? 0 points  What month? 0 points  What time? 0 points  Count back from 20 0 points  Months in reverse 0 points  Repeat phrase 0 points  Total Score 0 points   Most recent Audit-C alcohol use screening    06/19/2024    9:16 AM  Alcohol Use Disorder Test (AUDIT)  1. How often do you have a drink containing alcohol? 0  3. How often do you have six  or more drinks on one occasion? 0   A score of 3 or more in women, and 4 or more in men indicates increased risk for alcohol abuse, EXCEPT if all of the points are from question 1   No results found.  No results found for any visits on 06/23/24.  Assessment & Plan     Annual wellness visit done today including the all of the following: Reviewed patient's Family Medical History Reviewed and updated list of patient's medical providers Assessment of cognitive impairment was done Assessed patient's functional ability Established a written schedule for health screening services Health Risk Assessent Completed and Reviewed  Exercise Activities and Dietary recommendations  Goals   None     Immunization History  Administered Date(s) Administered   INFLUENZA, HIGH DOSE SEASONAL PF 05/24/2018   Influenza Inj Mdck Quad Pf 05/24/2018   Influenza, Mdck, Trivalent,PF 6+ MOS(egg free) 06/01/2024   Influenza, Seasonal, Injecte, Preservative Fre 06/22/2023   Influenza,inj,Quad PF,6+ Mos 05/29/2016, 06/18/2017, 05/20/2019, 06/08/2020, 06/19/2022   Influenza,inj,quad, With  Preservative 05/24/2018   Influenza-Unspecified 05/24/2018, 05/20/2019, 06/18/2021   Moderna Covid-19 Vaccine Bivalent Booster 45yrs & up 06/05/2022   Moderna Sars-Covid-2 Vaccination 12/28/2020, 07/08/2021   PFIZER(Purple Top)SARS-COV-2 Vaccination 12/01/2019, 12/22/2019, 06/21/2020   PNEUMOCOCCAL CONJUGATE-20 12/21/2023   Pneumococcal Conjugate-13 04/06/2015, 11/08/2017   Tdap 07/17/2005, 10/29/2015   Zoster Recombinant(Shingrix) 07/13/2020, 09/25/2020    Health Maintenance  Topic Date Due   Hepatitis B Vaccines 19-59 Average Risk (1 of 3 - 19+ 3-dose series) Never done   COVID-19 Vaccine (7 - 2025-26 season) 05/16/2024   Mammogram  04/19/2025   Medicare Annual Wellness (AWV)  06/23/2025   DTaP/Tdap/Td (3 - Td or Tdap) 10/28/2025   Colonoscopy  08/14/2026   Cervical Cancer Screening (HPV/Pap Cotest)  10/15/2027   Pneumococcal Vaccine: 50+ Years  Completed   Influenza Vaccine  Completed   Hepatitis C Screening  Completed   HIV Screening  Completed   Zoster Vaccines- Shingrix  Completed   HPV VACCINES  Aged Out   Meningococcal B Vaccine  Aged Out     Discussed health benefits of physical activity, and encouraged her to engage in regular exercise appropriate for her age and condition.    Problem List Items Addressed This Visit       Endocrine   Hyperglycemia   Relevant Orders   Hemoglobin A1c   Iodine deficiency   Relevant Orders   TSH     Other   Hypercholesterolemia   Relevant Orders   Lipid panel   Comprehensive metabolic panel with GFR   Lipoprotein A (LPA)   Vitamin D  deficiency disease   Relevant Orders   VITAMIN D  25 Hydroxy (Vit-D Deficiency, Fractures)   GAD (generalized anxiety disorder)   Relevant Orders   TSH   Other Visit Diagnoses       Encounter for annual wellness visit (AWV) in Medicare patient    -  Primary   Relevant Orders   Lipid panel   Comprehensive metabolic panel with GFR   Lipoprotein A (LPA)   B12   TSH   Hemoglobin A1c    VITAMIN D  25 Hydroxy (Vit-D Deficiency, Fractures)   Hepatitis B Surface AntiBODY   Zinc   Ceruloplasmin     Long-term use of high-risk medication       Relevant Orders   B12   Zinc   Ceruloplasmin     Hepatitis B vaccination status unknown       Relevant Orders   Hepatitis  B Surface AntiBODY           Adult Wellness Visit Routine wellness visit with discussion on screenings and preventive care. Pap smear not due until 2029. Mammogram up to date as of August. Colonoscopy due in two years. Pelvic and breast exams discussed, decision left to patient preference. Functional status and memory reviewed, both stable. - Order lipid panel, kidney and liver function tests, B12 level, TSH, A1c, vitamin D  level, hepatitis B titer, zinc, and copper levels. - Schedule six-month follow-up.  Anxiety Anxiety present. TSH testing justified by Medicare coverage for anxiety. - Order TSH for anxiety.  Functional impairment Functional impairment present but does not affect disability status. Functional status questions indicate stability, not normalcy.  Falls Two falls in the past year, one resulting in a broken arm and another a couple of weeks later.  Iodine deficiency Iodine deficiency present. Medicare does not cover thyroid testing for iodine deficiency, but will cover for anxiety.  General Health Maintenance Discussed RSV vaccine eligibility and recommendation to receive it at a pharmacy. Medicare covers it only at the pharmacy. Recommended Arexvi due to better data and upcoming RSV season. Flu and COVID vaccinations up to date. Discussed potential future availability of mRNA RSV vaccine but recommended current available vaccine due to better data and upcoming RSV season. - Advise RSV vaccination at pharmacy. - Continue annual flu vaccination. - Continue COVID vaccination as needed.       Return in about 6 months (around 12/22/2024) for chronic disease f/u.     Jon Eva, MD   [facility] [phone] (phone) [phone] (fax)  [facility]

## 2024-07-01 ENCOUNTER — Ambulatory Visit: Payer: Self-pay | Admitting: Family Medicine

## 2024-07-02 LAB — COMPREHENSIVE METABOLIC PANEL WITH GFR
ALT: 25 IU/L (ref 0–32)
AST: 28 IU/L (ref 0–40)
Albumin: 4.5 g/dL (ref 3.8–4.9)
Alkaline Phosphatase: 85 IU/L (ref 49–135)
BUN/Creatinine Ratio: 18 (ref 9–23)
BUN: 12 mg/dL (ref 6–24)
Bilirubin Total: 0.5 mg/dL (ref 0.0–1.2)
CO2: 25 mmol/L (ref 20–29)
Calcium: 9.9 mg/dL (ref 8.7–10.2)
Chloride: 101 mmol/L (ref 96–106)
Creatinine, Ser: 0.68 mg/dL (ref 0.57–1.00)
Globulin, Total: 2.7 g/dL (ref 1.5–4.5)
Glucose: 90 mg/dL (ref 70–99)
Potassium: 4.3 mmol/L (ref 3.5–5.2)
Sodium: 142 mmol/L (ref 134–144)
Total Protein: 7.2 g/dL (ref 6.0–8.5)
eGFR: 103 mL/min/1.73 (ref >59)

## 2024-07-02 LAB — LIPID PANEL
Chol/HDL Ratio: 3 ratio (ref 0.0–4.4)
Cholesterol, Total: 193 mg/dL (ref 100–199)
HDL: 65 mg/dL (ref >39)
LDL Chol Calc (NIH): 114 mg/dL — ABNORMAL HIGH (ref 0–99)
Triglycerides: 77 mg/dL (ref 0–149)
VLDL Cholesterol Cal: 14 mg/dL (ref 5–40)

## 2024-07-02 LAB — TSH: TSH: 1.41 u[IU]/mL (ref 0.450–4.500)

## 2024-07-02 LAB — HEMOGLOBIN A1C
Est. average glucose Bld gHb Est-mCnc: 111 mg/dL
Hgb A1c MFr Bld: 5.5 % (ref 4.8–5.6)

## 2024-07-02 LAB — CERULOPLASMIN: Ceruloplasmin: 37 mg/dL (ref 19.0–39.0)

## 2024-07-02 LAB — ZINC: Zinc: 91 ug/dL (ref 44–115)

## 2024-07-02 LAB — HEPATITIS B SURFACE ANTIBODY,QUALITATIVE: Hep B Surface Ab, Qual: REACTIVE

## 2024-07-02 LAB — VITAMIN B12: Vitamin B-12: 582 pg/mL (ref 232–1245)

## 2024-07-02 LAB — VITAMIN D 25 HYDROXY (VIT D DEFICIENCY, FRACTURES): Vit D, 25-Hydroxy: 37 ng/mL (ref 30.0–100.0)

## 2024-07-02 LAB — LIPOPROTEIN A (LPA): Lipoprotein (a): 215 nmol/L — ABNORMAL HIGH (ref <75.0)

## 2024-07-05 ENCOUNTER — Encounter: Payer: Self-pay | Admitting: Family Medicine

## 2024-07-05 ENCOUNTER — Ambulatory Visit (INDEPENDENT_AMBULATORY_CARE_PROVIDER_SITE_OTHER): Admitting: Family Medicine

## 2024-07-05 VITALS — BP 119/68 | HR 82 | Ht 62.0 in | Wt 142.6 lb

## 2024-07-05 DIAGNOSIS — E7800 Pure hypercholesterolemia, unspecified: Secondary | ICD-10-CM | POA: Diagnosis not present

## 2024-07-05 DIAGNOSIS — E7841 Elevated Lipoprotein(a): Secondary | ICD-10-CM | POA: Diagnosis not present

## 2024-07-05 NOTE — Progress Notes (Signed)
 Established patient visit   Patient: Autumn Zhang   DOB: 04/07/1971   53 y.o. Female  MRN: [id number] Visit Date: 07/05/2024  Today's healthcare provider: Jon Eva, MD   Chief Complaint  Patient presents with   Health Maintence    Patient is present to discuss lipids and cardiovascular risk after labs on 06/29/24   Subjective    HPI HPI     Health Maintence    Additional comments: Patient is present to discuss lipids and cardiovascular risk after labs on 06/29/24      Last edited by Lilian Fitzpatrick, CMA on 07/05/2024 10:11 AM.       Discussed the use of AI scribe software for clinical note transcription with the patient, who gave verbal consent to proceed.  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  She experiences ongoing fatigue and is concerned about her vitamin D  levels, which are currently at 37. She takes vitamin D  supplements at a dose of 1000 IU daily.  She has Ehlers-Danlos syndrome, which causes frequent joint dislocations, including her jaw. This condition affects her daily activities due to the risk of dislocations.  She has mast cell activation syndrome, which limits her ability to take certain supplements, such as Coenzyme Q10, due to adverse reactions. An attempt to take CoQ10 resulted in a flare-up of symptoms.  Her lipid profile shows a high lipoprotein(a) level. She manages her cholesterol through diet and exercise, maintaining a carbohydrate intake of 25-30%. She takes aspirin every three days to manage cardiovascular risk.       Medications: Outpatient Medications Prior to Visit  Medication Sig   acetaminophen  (TYLENOL ) 500 MG tablet Take 500 mg by mouth every 6 (six) hours as needed.   albuterol  (PROVENTIL  HFA;VENTOLIN  HFA) 108 (90 Base) MCG/ACT inhaler Inhale 2 puffs into the lungs  every 4 (four) hours as needed.    aspirin 325 MG tablet Take 325 mg by mouth. Every 3 days   Atogepant  (QULIPTA ) 10 MG TABS Take 1 tablet by mouth daily.   azelastine (ASTELIN) 0.1 % nasal spray Place into both nostrils as needed for rhinitis. Use in each nostril as directed   Blood Pressure Monitoring (BLOOD PRESSURE CUFF) MISC Take blood pressure as needed for symptoms.   Boswellia Serrata (BOSWELLIA PO) Take 150 mg by mouth every morning.    Calcium Citrate 200 MG TABS Take 600 mg by mouth. powder   cromolyn  (GASTROCROM ) 100 MG/5ML solution Take by mouth 4 (four) times daily -  before meals and at bedtime.   Cyanocobalamin (VITAMIN B-12) 1000 MCG SUBL Place 500 mcg under the tongue daily.   Diclofenac  Sodium 1.5 % SOLN Apply topically QID prn   diphenhydrAMINE -Allantoin 2-0.5 % CREA Apply 1 application topically as needed.    docusate sodium (COLACE) 100 MG capsule Take 100 mg by mouth every other day.    Epinastine HCl 0.05 % ophthalmic solution Apply 1 drop to eye 2 (two) times daily.   EPINEPHrine  0.3 mg/0.3 mL IJ SOAJ injection Inject 0.3 mg into the muscle as needed for anaphylaxis. INJECT ONE AUTO-INJECTOR INTRAMUSCULARLY AS NEEDED   Estradiol  (YUVAFEM ) 10 MCG TABS vaginal tablet Place 1 tablet (10 mcg total) vaginally 2 (two) times a week.   famotidine  (PEPCID ) 10 MG tablet Take 10 mg by mouth daily.    fexofenadine (ALLEGRA)  180 MG tablet Take 180 mg by mouth 2 (two) times daily.   Folic Acid  (FOLATE PO) Take 0.5 tablets by mouth daily. Folate as calcium foliate 680 mcg DFE   guaifenesin (HUMIBID E) 400 MG TABS tablet Take 400 mg by mouth as needed.   hydrOXYzine  (ATARAX ) 10 MG tablet Take 10 mg by mouth 5 (five) times daily as needed for itching (Hives).   IODINE EX Take 225 mcg by mouth. Every 3 days   KETAMINE HCL SL Place into right nostril as needed.   Ketotifen Fumarate POWD 0.5 mg by Does not apply route. 6-12 times a day   L-Lysine 500 MG CAPS Take by mouth 3 (three) times  daily.   levocetirizine (XYZAL ) 2.5 MG/5ML solution as needed.   Light Mineral Oil-Mineral Oil 0.5-0.5 % EMUL Apply 1 application to eye daily.   lipase/protease/amylase (CREON ) 12000-38000 units CPEP capsule TAKE 1-3 CAPSULES (12,000-36,000 UNITS TOTAL) BY MOUTH 3 (THREE) TIMES DAILY BEFORE MEALS. PATIENT MAY TAKE AN ADDITIONAL 3 CAPSULES PER DAY FOR SNACK.   Magnesium Citrate POWD Take 100 mg by mouth daily.   Melatonin 1 MG/ML LIQD Take 1 mg by mouth daily as needed.   Menatetrenone (VITAMIN K2) 100 MCG TABS 100 mcg daily.   montelukast  (SINGULAIR ) 5 MG chewable tablet Chew 10 mg by mouth daily at 2 PM.   Non Gelatin Capsules, Empty, (CAPSULE #3 CLEAR/CLEAR VEG) CAPS Take 1 capsule by mouth daily.   NONFORMULARY OR COMPOUNDED ITEM Bupivacaine  nasal 0.5% drops w/o epi   nystatin  cream (MYCOSTATIN ) Apply 1 Application topically 2 (two) times daily.   omega-3 acid ethyl esters (LOVAZA ) 1 g capsule Take 1 capsule (1 g total) by mouth daily.   PHOSPHATIDYL CHOLINE PO Take 1 tablet by mouth daily.   Phytonadione  (VITAMIN K1 ) POWD 100 mcg daily.   Riboflavin (VITAMIN B-2 PO) Take 100 mg by mouth 3 (three) times daily.   Selenium 200 MCG CAPS Take 1 capsule by mouth. Twice a month   simethicone (MYLICON) 80 MG chewable tablet Chew 80 mg by mouth every 6 (six) hours as needed for flatulence.   Sodium Fluoride  1.1 % PSTE Place onto teeth.   trolamine salicylate (ASPERCREME/ALOE) 10 % cream Apply topically.   Ubrogepant  (UBRELVY ) 50 MG TABS Take 1 tablet (50 mg total) by mouth as needed (for migriane). May repeat a dose in 2 hours if headache persists   vitamin C (ASCORBIC ACID) 500 MG tablet Take 500 mg by mouth daily. With calcium   Vitamin D , Cholecalciferol, 10 MCG (400 UNIT) TABS Take 1,000 Int'l Units/day by mouth daily.   ZORYVE  0.3 % CREA APPLY TO ELBOWS ONCE DAILY.   No facility-administered medications prior to visit.    Review of Systems     Objective    BP 119/68 (BP Location:  Left Arm, Patient Position: Sitting, Cuff Size: Normal)   Pulse 82   Ht 5' 2 (1.575 m)   Wt 142 lb 9.6 oz (64.7 kg)   SpO2 100%   BMI 26.08 kg/m    Physical Exam Vitals reviewed.  Constitutional:      General: She is not in acute distress.    Appearance: She is well-developed.  HENT:     Head: Normocephalic and atraumatic.  Eyes:     General: No scleral icterus.    Conjunctiva/sclera: Conjunctivae normal.  Cardiovascular:     Rate and Rhythm: Normal rate and regular rhythm.  Pulmonary:     Effort: Pulmonary effort is normal. No  respiratory distress.  Skin:    General: Skin is warm and dry.     Findings: No rash.  Neurological:     Mental Status: She is alert and oriented to person, place, and time.  Psychiatric:        Behavior: Behavior normal.      No results found for any visits on 07/05/24.  Assessment & Plan     Problem List Items Addressed This Visit       Other   Hypercholesterolemia   Relevant Orders   Ambulatory referral to Cardiology   Other Visit Diagnoses       Elevated lipoprotein A level    -  Primary   Relevant Orders   Ambulatory referral to Cardiology           Elevated lipoprotein(a) (LPA) and hyperlipidemia Elevated LPA with a CT calcium score of zero and low ASCVD risk. Current guidelines do not strongly recommend statin therapy due to low ASCVD risk, but elevated LPA indicates high risk. Lifestyle modifications have limited impact on LPA levels. Statins may increase LPA but offer mortality and cardiac event reduction benefits. Consideration of advanced lipid clinic consultation for further evaluation and potential participation in new drug trials. - Referred to advanced lipid clinic in [facility] for further evaluation and potential participation in new drug trials. - Will consider starting low-dose Crestor (rosuvastatin) 5 mg every 2-3 days if lipid clinic recommends statin therapy.  Fatigue Possibly related to vitamin D  levels,  though current level of 37 ng/mL is not significantly low. Other factors may contribute to fatigue. - Increase vitamin D  supplementation to 2000 IU daily and monitor for improvement in fatigue.  General Health Maintenance Discussion on dietary options including whole food plant-based and Mediterranean diets, both beneficial for reducing risks of cancer, diabetes, and cardiovascular issues. Consideration of lifestyle medicine consultation for further guidance. - Consider consultation with Dr. Donzell in [place] for lifestyle medicine and dietary guidance.        No follow-ups on file.       Jon Eva, MD  [facility] [phone] (phone) [phone] (fax)  [facility]

## 2024-07-11 ENCOUNTER — Inpatient Hospital Stay: Attending: Oncology

## 2024-07-11 DIAGNOSIS — D509 Iron deficiency anemia, unspecified: Secondary | ICD-10-CM | POA: Insufficient documentation

## 2024-07-11 LAB — CBC WITH DIFFERENTIAL (CANCER CENTER ONLY)
Abs Immature Granulocytes: 0.01 K/uL (ref 0.00–0.07)
Basophils Absolute: 0.1 K/uL (ref 0.0–0.1)
Basophils Relative: 1 %
Eosinophils Absolute: 0.2 K/uL (ref 0.0–0.5)
Eosinophils Relative: 3 %
HCT: 40.8 % (ref 36.0–46.0)
Hemoglobin: 13.4 g/dL (ref 12.0–15.0)
Immature Granulocytes: 0 %
Lymphocytes Relative: 25 %
Lymphs Abs: 1.7 K/uL (ref 0.7–4.0)
MCH: 29.9 pg (ref 26.0–34.0)
MCHC: 32.8 g/dL (ref 30.0–36.0)
MCV: 91.1 fL (ref 80.0–100.0)
Monocytes Absolute: 0.6 K/uL (ref 0.1–1.0)
Monocytes Relative: 9 %
Neutro Abs: 4.1 K/uL (ref 1.7–7.7)
Neutrophils Relative %: 62 %
Platelet Count: 266 K/uL (ref 150–400)
RBC: 4.48 MIL/uL (ref 3.87–5.11)
RDW: 12.6 % (ref 11.5–15.5)
WBC Count: 6.7 K/uL (ref 4.0–10.5)
nRBC: 0 % (ref 0.0–0.2)

## 2024-07-11 LAB — IRON AND TIBC
Iron: 82 ug/dL (ref 28–170)
Saturation Ratios: 20 % (ref 10.4–31.8)
TIBC: 406 ug/dL (ref 250–450)
UIBC: 324 ug/dL

## 2024-07-11 LAB — FERRITIN: Ferritin: 117 ng/mL (ref 11–307)

## 2024-07-14 ENCOUNTER — Ambulatory Visit (INDEPENDENT_AMBULATORY_CARE_PROVIDER_SITE_OTHER): Admitting: Family Medicine

## 2024-07-14 ENCOUNTER — Encounter: Payer: Self-pay | Admitting: Family Medicine

## 2024-07-14 VITALS — BP 133/76 | HR 85 | Ht 62.0 in | Wt 141.9 lb

## 2024-07-14 DIAGNOSIS — K208 Other esophagitis without bleeding: Secondary | ICD-10-CM

## 2024-07-14 DIAGNOSIS — T50905A Adverse effect of unspecified drugs, medicaments and biological substances, initial encounter: Secondary | ICD-10-CM

## 2024-07-14 DIAGNOSIS — G4719 Other hypersomnia: Secondary | ICD-10-CM

## 2024-07-14 MED ORDER — SUCRALFATE 1 GM/10ML PO SUSP: 1.0000 g | Freq: Three times a day (TID) | ORAL | 0 refills | Status: DC

## 2024-07-14 NOTE — Progress Notes (Signed)
 Acute visit   Patient: Autumn Zhang   DOB: 1971/03/17   53 y.o. Female  MRN: [id number] PCP: Myrla Jon HERO, MD   Chief Complaint  Patient presents with   Acute Visit    Severe fatigue. And sudden reflux worsening.   Fatigue    Advised to increase vitamin D  to 2000 IU daily on 07/05/24 and has gradually worsened since then. She reports she has increased and it is not making a difference. Sleep apnea is very well controlled and iron  and ferratin were good   Gastroesophageal Reflux    She reports bad episode on 07/09/24 after swallowing a capsule of sodium halogenate and has maintained since then. Reports she has been sleeping sitting up and that every pill she has swallowed since then feels as if it is getting stuck. Swallowed fish bone on 10/19 and not sure if it is related.    Subjective    Discussed the use of AI scribe software for clinical note transcription with the patient, who gave verbal consent to proceed.  History of Present Illness   Autumn Zhang is a 53 year old female who presents with difficulty swallowing and fatigue.  She has experienced difficulty swallowing since October 19th after ingesting a fish bone. On October 25th, ingestion of a sodium alginate capsule resulted in severe chest pain and a sensation of obstruction. Since then, she has difficulty swallowing pills, including small ones like Pepcid , and has been sleeping sitting up for four days. Food does not get caught as much, but she struggles to consume 1000 calories daily. She is awaiting a specialist appointment in February.  She experiences significant fatigue, describing it as 'almost unbearable.' Her blood pressure is often in the 90s or low 100s, occasionally dropping to 72/50. She supplements with sodium and potassium. Her iron  studies, vitamin D , B12, and TSH levels are normal, and her sleep apnea is well controlled. Despite these, she feels extremely tired, even when standing, which previously  helped her stay alert.        Review of Systems  Objective    BP 133/76 (BP Location: Left Arm, Patient Position: Sitting, Cuff Size: Normal)   Pulse 85   Ht 5' 2 (1.575 m)   Wt 141 lb 14.4 oz (64.4 kg)   SpO2 98%   BMI 25.95 kg/m   Physical Exam Vitals reviewed.  Constitutional:      General: She is not in acute distress.    Appearance: She is well-developed.  HENT:     Head: Normocephalic and atraumatic.  Eyes:     General: No scleral icterus.    Conjunctiva/sclera: Conjunctivae normal.  Cardiovascular:     Rate and Rhythm: Normal rate and regular rhythm.  Pulmonary:     Effort: Pulmonary effort is normal. No respiratory distress.  Skin:    General: Skin is warm and dry.     Findings: No rash.  Neurological:     Mental Status: She is alert and oriented to person, place, and time.  Psychiatric:        Behavior: Behavior normal.       No results found for any visits on 07/14/24.  Assessment & Plan     Problem List Items Addressed This Visit   None Visit Diagnoses       Excessive daytime sleepiness    -  Primary   Relevant Orders   Cortisol     Pill esophagitis  Pill-induced esophagitis Pill-induced esophagitis likely triggered by a sodium alginate capsule taken on October 25th, causing severe chest pain and dysphagia. Symptoms include sensation of pills being stuck and reduced food intake due to fear of exacerbating symptoms. Previous endoscopies have been mostly normal, with no eosinophilic esophagitis diagnosed. The condition is likely due to irritation and inflammation of the esophagus, possibly exacerbated by mast cell reaction. - Contact Dr. Kayla office to expedite appointment due to swallowing difficulties. - Continue Pepcid  for acid reduction to aid healing. - Avoid spicy, carbonated, or irritating foods. - Consider using sucralfate liquid, but verify with pharmacy regarding ethanol content. - Explore alternative methods for  taking supplements, such as crushing pills or using gelatin. - Consider steroid inhaler if condition worsens.  Hypersomnia Persistent fatigue and excessive daytime sleepiness despite well-controlled sleep apnea, normal iron  studies, vitamin D , B12, and TSH levels. Blood pressure is often low, but not orthostatic. Fatigue is not improved by napping and may be related to hypersomnia, characterized by excessive sleepiness without a clear cause. Adrenal insufficiency is considered but deemed unlikely due to normal potassium levels and lack of response to steroids. - Order morning cortisol level to rule out adrenal insufficiency. - Continue monitoring blood pressure and sodium intake. - Avoid daytime napping to manage hypersomnia symptoms. - Consider referral to sleep specialist if symptoms persist.       Meds ordered this encounter  Medications   sucralfate (CARAFATE) 1 GM/10ML suspension    Sig: Take 10 mLs (1 g total) by mouth 4 (four) times daily -  with meals and at bedtime.    Dispense:  420 mL    Refill:  0     No follow-ups on file.      Jon Eva, MD  [facility] [phone] (phone) [phone] (fax)  [facility]

## 2024-07-15 ENCOUNTER — Encounter: Payer: Self-pay | Admitting: Family Medicine

## 2024-07-20 ENCOUNTER — Ambulatory Visit: Admitting: Anesthesiology

## 2024-07-20 ENCOUNTER — Other Ambulatory Visit: Payer: Self-pay

## 2024-07-20 ENCOUNTER — Ambulatory Visit

## 2024-07-20 ENCOUNTER — Ambulatory Visit
Admission: RE | Admit: 2024-07-20 | Discharge: 2024-07-20 | Disposition: A | Discharge status: Home / Self Care | Attending: Gastroenterology | Admitting: Gastroenterology

## 2024-07-20 ENCOUNTER — Encounter: Payer: Self-pay | Admitting: Gastroenterology

## 2024-07-20 ENCOUNTER — Encounter
Admission: RE | Disposition: A | Discharge status: Home / Self Care | Payer: Self-pay | Source: Home / Self Care | Attending: Gastroenterology

## 2024-07-20 DIAGNOSIS — Z87891 Personal history of nicotine dependence: Secondary | ICD-10-CM | POA: Insufficient documentation

## 2024-07-20 DIAGNOSIS — G4730 Sleep apnea, unspecified: Secondary | ICD-10-CM | POA: Insufficient documentation

## 2024-07-20 DIAGNOSIS — K219 Gastro-esophageal reflux disease without esophagitis: Secondary | ICD-10-CM | POA: Diagnosis not present

## 2024-07-20 DIAGNOSIS — M797 Fibromyalgia: Secondary | ICD-10-CM | POA: Diagnosis not present

## 2024-07-20 DIAGNOSIS — Z79899 Other long term (current) drug therapy: Secondary | ICD-10-CM | POA: Insufficient documentation

## 2024-07-20 DIAGNOSIS — G43909 Migraine, unspecified, not intractable, without status migrainosus: Secondary | ICD-10-CM | POA: Diagnosis not present

## 2024-07-20 DIAGNOSIS — M1990 Unspecified osteoarthritis, unspecified site: Secondary | ICD-10-CM | POA: Diagnosis not present

## 2024-07-20 DIAGNOSIS — R1314 Dysphagia, pharyngoesophageal phase: Secondary | ICD-10-CM | POA: Insufficient documentation

## 2024-07-20 DIAGNOSIS — J45909 Unspecified asthma, uncomplicated: Secondary | ICD-10-CM | POA: Insufficient documentation

## 2024-07-20 HISTORY — PX: ESOPHAGOGASTRODUODENOSCOPY: SHX5428

## 2024-07-20 SURGERY — EGD (ESOPHAGOGASTRODUODENOSCOPY)
Anesthesia: General

## 2024-07-20 MED ORDER — DIPHENHYDRAMINE HCL 50 MG/ML IJ SOLN
INTRAMUSCULAR | Status: AC
  Filled 2024-07-20: qty 1

## 2024-07-20 MED ORDER — PROPOFOL 500 MG/50ML IV EMUL
INTRAVENOUS | Status: DC | PRN
  Administered 2024-07-20 (×2): 20 mg via INTRAVENOUS
  Administered 2024-07-20: 80 mg via INTRAVENOUS

## 2024-07-20 MED ORDER — CETIRIZINE HCL 5 MG/5ML PO SOLN
10.0000 mg | Freq: Once | ORAL | Status: AC
  Administered 2024-07-20: 10 mg via ORAL
  Filled 2024-07-20: qty 10

## 2024-07-20 MED ORDER — FAMOTIDINE IN NACL 20-0.9 MG/50ML-% IV SOLN
20.0000 mg | Freq: Once | INTRAVENOUS | Status: AC
  Administered 2024-07-20: 20 mg via INTRAVENOUS
  Filled 2024-07-20: qty 50

## 2024-07-20 MED ORDER — SODIUM CHLORIDE 0.9 % IV SOLN: INTRAVENOUS | Status: DC

## 2024-07-20 MED ORDER — PROPOFOL 10 MG/ML IV BOLUS
INTRAVENOUS | Status: AC
  Filled 2024-07-20: qty 20

## 2024-07-20 NOTE — Anesthesia Postprocedure Evaluation (Signed)
 Anesthesia Post Note  Patient: Autumn Zhang  Procedure(s) Performed: EGD (ESOPHAGOGASTRODUODENOSCOPY)  Patient location during evaluation: PACU Anesthesia Type: General Level of consciousness: awake Pain management: satisfactory to patient Vital Signs Assessment: post-procedure vital signs reviewed and stable Respiratory status: spontaneous breathing Cardiovascular status: stable Anesthetic complications: no   No notable events documented.   Last Vitals:  Vitals:   07/20/24 1159 07/20/24 1209  BP: (!) 92/56 107/74  Pulse: 67 70  Resp: 14 18  Temp:    SpO2: 100% 100%    Last Pain:  Vitals:   07/20/24 1209  TempSrc:   PainSc: 0-No pain                 VAN STAVEREN,Nik Gorrell

## 2024-07-20 NOTE — Anesthesia Preprocedure Evaluation (Signed)
 Anesthesia Evaluation  Patient identified by MRN, date of birth, ID band Patient awake    Reviewed: Allergy & Precautions, NPO status , Patient's Chart, lab work & pertinent test results  Airway Mallampati: III  TM Distance: >3 FB Neck ROM: Full    Dental  (+) Teeth Intact   Pulmonary neg pulmonary ROS, asthma , sleep apnea and Continuous Positive Airway Pressure Ventilation , former smoker   Pulmonary exam normal breath sounds clear to auscultation       Cardiovascular Exercise Tolerance: Good negative cardio ROS Normal cardiovascular exam Rhythm:Regular Rate:Normal     Neuro/Psych  Headaches  Anxiety     negative neurological ROS  negative psych ROS   GI/Hepatic negative GI ROS, Neg liver ROS,GERD  Medicated,,  Endo/Other  negative endocrine ROS    Renal/GU negative Renal ROS  negative genitourinary   Musculoskeletal  (+) Arthritis ,  Fibromyalgia -  Abdominal Normal abdominal exam  (+)   Peds negative pediatric ROS (+)  Hematology negative hematology ROS (+) Blood dyscrasia, anemia   Anesthesia Other Findings Past Medical History: No date: Allergy No date: Anemia No date: Anxiety No date: Arthritis No date: Asthma 10/18/2015: Attention deficit hyperactivity disorder (ADHD), combined  type     Comment:  Diagnosed many years ago.  Has been doing well on               Ritalin  5mg  QID to six times a day. Long acting hasn't               worked well in the past and she has not wanted to try               Adderall. She is taking wellbutrin as well, but she has               been weaning medication due to SE migraine.  She is now               down to Wellbutrin one quarter tablet daily.  Last               Assessment & Plan:  A/P: Stable.  Continue current               dosing.  Refilled 3 months 05/16/2021: Basal cell carcinoma     Comment:  Superficial, R post lower leg. [facility]                [place], [place] 09/03/2006: Basal cell carcinoma     Comment:  L chest, R forearm. 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 No date: Dysautonomia (HCC) 05/28/2009: Dysplastic nevus     Comment:  right lateral back, [facility] [place], [place] 01/13/2023: Dysplastic nevus     Comment:  left lower calf, mod to severe, shave removal 03/16/23,               margins free 03/16/23 No date: Ehlers-Danlos syndrome type III No date: Family history of colon cancer No date: GERD (gastroesophageal reflux disease) No date: Hyperlipidemia No date: Idiopathic small fiber peripheral neuropathy No date: Leiomyoma No date: Mast cell activation No date: Migraines No date: Neck pain No date:  Occipital neuralgia 2025: Osteoporosis     Comment:  fragility break of wrist after fall No date: Sleep apnea  Past Surgical History: No date: APPENDECTOMY     Comment:  removed with colectomy No date: BREAST SURGERY     Comment:  reduction 2008: COLON SURGERY     Comment:  for colon cancer, ~1/3 of colon removed, primary               reanastimosis 10/07/2018: COLONOSCOPY WITH PROPOFOL ; N/A     Comment:  Procedure: COLONOSCOPY WITH PROPOFOL ;  Surgeon: Unk Corinn Skiff, MD;  Location: [facility];  Service:               Gastroenterology;  Laterality: N/A; 08/14/2021: COLONOSCOPY WITH PROPOFOL ; N/A     Comment:  Procedure: COLONOSCOPY WITH PROPOFOL ;  Surgeon: Unk Corinn Skiff, MD;  Location: [facility];  Service:               Gastroenterology;  Laterality: N/A; No date: ENDOMETRIAL ABLATION No date: trigeminal stimulator  BMI    Body Mass Index: 25.42 kg/m      Reproductive/Obstetrics negative OB ROS                               Anesthesia Physical Anesthesia Plan  ASA: 2  Anesthesia Plan: General   Post-op Pain Management:    Induction: Intravenous  PONV Risk Score and Plan: Propofol  infusion and TIVA  Airway Management Planned: Natural Airway and Nasal Cannula  Additional Equipment:   Intra-op Plan:   Post-operative Plan:   Informed Consent: I have reviewed the patients History and Physical, chart, labs and discussed the procedure including the risks, benefits and alternatives for the proposed anesthesia with the patient or authorized representative who has indicated his/her understanding and acceptance.     Dental Advisory Given  Plan Discussed with: CRNA  Anesthesia Plan Comments:         Anesthesia Quick Evaluation

## 2024-07-20 NOTE — Op Note (Signed)
 [facility] Gastroenterology Patient Name: Autumn Zhang Procedure Date: 07/20/2024 9:13 AM MRN: [id number] Account #: [id number] Date of Birth: 1971-04-21 Admit Type: Outpatient Age: 53 Room: [facility] ENDO ROOM 3 Gender: Female Note Status: Finalized Instrument Name: Barnie GI Scope [id number] Procedure:             Upper GI endoscopy Indications:           Esophageal dysphagia Providers:             Corinn Jess Brooklyn MD, MD Referring MD:          Jon HERO. Bacigalupo (Referring MD) Medicines:             General Anesthesia Complications:         No immediate complications. Estimated blood loss: None. Procedure:             Pre-Anesthesia Assessment:                        - Prior to the procedure, a History and Physical was                         performed, and patient medications and allergies were                         reviewed. The patient is competent. The risks and                         benefits of the procedure and the sedation options and                         risks were discussed with the patient. All questions                         were answered and informed consent was obtained.                         Patient identification and proposed procedure were                         verified by the physician, the nurse, the                         anesthesiologist, the anesthetist and the technician                         in the pre-procedure area in the procedure room in the                         endoscopy suite. Mental Status Examination: alert and                         oriented. Airway Examination: normal oropharyngeal                         airway and neck mobility. Respiratory Examination:                         clear to auscultation. CV Examination: normal.  Prophylactic Antibiotics: The patient does not require                         prophylactic antibiotics. Prior Anticoagulants: The                         patient has  taken no anticoagulant or antiplatelet                         agents. ASA Grade Assessment: III - A patient with                         severe systemic disease. After reviewing the risks and                         benefits, the patient was deemed in satisfactory                         condition to undergo the procedure. The anesthesia                         plan was to use general anesthesia. Immediately prior                         to administration of medications, the patient was                         re-assessed for adequacy to receive sedatives. The                         heart rate, respiratory rate, oxygen saturations,                         blood pressure, adequacy of pulmonary ventilation, and                         response to care were monitored throughout the                         procedure. The physical status of the patient was                         re-assessed after the procedure.                        After obtaining informed consent, the endoscope was                         passed under direct vision. Throughout the procedure,                         the patient's blood pressure, pulse, and oxygen                         saturations were monitored continuously. The Endoscope                         was introduced through the mouth, and advanced to the  second part of duodenum. The upper GI endoscopy was                         accomplished without difficulty. The patient tolerated                         the procedure well. Findings:      The duodenal bulb and second portion of the duodenum were normal.      The entire examined stomach was normal.      The cardia and gastric fundus were normal on retroflexion.      Esophagogastric landmarks were identified: the gastroesophageal junction       was found at 35 cm from the incisors.      The gastroesophageal junction and examined esophagus were normal.       Biopsies were taken with a  cold forceps for histology. Impression:            - Normal duodenal bulb and second portion of the                         duodenum.                        - Normal stomach.                        - Esophagogastric landmarks identified.                        - Normal gastroesophageal junction and esophagus.                         Biopsied. Recommendation:        - Await pathology results.                        - Discharge patient to home (with escort).                        - Resume previous diet today.                        - Continue present medications. Procedure Code(s):     --- Professional ---                        [phone], Esophagogastroduodenoscopy, flexible,                         transoral; with biopsy, single or multiple Diagnosis Code(s):     --- Professional ---                        R13.14, Dysphagia, pharyngoesophageal phase CPT copyright 2022 American Medical Association. All rights reserved. The codes documented in this report are preliminary and upon coder review may  be revised to meet current compliance requirements. Dr. Corinn Brooklyn Corinn Jess Brooklyn MD, MD 07/20/2024 11:35:05 AM This report has been signed electronically. Number of Addenda: 0 Note Initiated On: 07/20/2024 9:13 AM Estimated Blood Loss:  Estimated blood loss: none.      [facility]

## 2024-07-20 NOTE — H&P (Signed)
 Autumn JONELLE Brooklyn, MD [facility] Gastroenterology, [facility] [address]  [place], [place] [address]  Main: [phone] Fax:  [phone] Pager: [phone]   Primary Care Physician:  Myrla Jon HERO, MD Primary Gastroenterologist:  Dr. Corinn JONELLE Zhang  Pre-Procedure History & Physical: HPI:  Autumn Zhang is a 53 y.o. female is here for an endoscopy.   Past Medical History:  Diagnosis Date   Allergy    Anemia    Anxiety    Arthritis    Asthma    Attention deficit hyperactivity disorder (ADHD), combined type 10/18/2015   Diagnosed many years ago.  Has been doing well on Ritalin  5mg  QID to six times a day. Long acting hasn't worked well in the past and she has not wanted to try Adderall. She is taking wellbutrin as well, but she has been weaning medication due to SE migraine.  She is now down to Wellbutrin one quarter tablet daily.  Last Assessment & Plan:  A/P: Stable.  Continue current dosing.  Refilled 3 months   Basal cell carcinoma 05/16/2021   Superficial, R post lower leg. [facility] [place] [place]   Basal cell carcinoma 09/03/2006   L chest, R forearm. 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   Dysautonomia (HCC)    Dysplastic nevus 05/28/2009   right lateral back, [facility] [place], [place]   Dysplastic nevus 01/13/2023   left lower calf, mod to severe, shave removal 03/16/23, margins free 03/16/23   Ehlers-Danlos syndrome type III    Family history of colon cancer    GERD (gastroesophageal reflux disease)    Hyperlipidemia    Idiopathic small fiber peripheral neuropathy    Leiomyoma    Mast cell activation    Migraines    Neck pain    Occipital neuralgia    Osteoporosis 2025   fragility break of wrist after fall   Sleep apnea     Past Surgical  History:  Procedure Laterality Date   APPENDECTOMY     removed with colectomy   BREAST SURGERY     reduction   COLON SURGERY  2008   for colon cancer, ~1/3 of colon removed, primary reanastimosis   COLONOSCOPY WITH PROPOFOL  N/A 10/07/2018   Procedure: COLONOSCOPY WITH PROPOFOL ;  Surgeon: Zhang Autumn Skiff, MD;  Location: [facility] ENDOSCOPY;  Service: Gastroenterology;  Laterality: N/A;   COLONOSCOPY WITH PROPOFOL  N/A 08/14/2021   Procedure: COLONOSCOPY WITH PROPOFOL ;  Surgeon: Zhang Autumn Skiff, MD;  Location: [facility] ENDOSCOPY;  Service: Gastroenterology;  Laterality: N/A;   ENDOMETRIAL ABLATION     trigeminal stimulator      Prior to Admission medications   Medication Sig Start Date End Date Taking? Authorizing Provider  aspirin 325 MG tablet Take 325 mg by mouth. Every 3 days   Yes [provider]  Atogepant  (QULIPTA ) 10 MG TABS Take 1 tablet by mouth daily. 11/17/23  Yes Bacigalupo, Angela M, MD  Boswellia Serrata (BOSWELLIA PO) Take 150 mg by mouth every morning.    Yes [provider]  Calcium Citrate 200 MG TABS Take 600 mg by mouth. powder   Yes [provider]  cromolyn  (GASTROCROM ) 100 MG/5ML solution Take by mouth 4 (four) times daily -  before meals and at bedtime.   Yes [provider]  Cyanocobalamin (VITAMIN B-12) 1000 MCG SUBL Place 500 mcg under the tongue daily.   Yes [provider]  docusate sodium (COLACE) 100 MG capsule Take 100 mg by mouth every other day.    Yes [provider]  famotidine  (PEPCID ) 10 MG tablet Take 10 mg by mouth daily.    Yes [provider]  fexofenadine (ALLEGRA) 180 MG tablet Take 180 mg by mouth 2 (two) times daily.   Yes [provider]  Folic Acid  (FOLATE PO) Take 0.5 tablets by mouth daily. Folate as calcium foliate 680 mcg DFE   Yes [provider]  guaifenesin (HUMIBID E) 400 MG TABS tablet Take 400 mg by mouth as needed.   Yes [provider]   hydrOXYzine  (ATARAX ) 10 MG tablet Take 10 mg by mouth 5 (five) times daily as needed for itching (Hives).   Yes [provider]  IODINE EX Take 225 mcg by mouth. Every 3 days   Yes [provider]  KETAMINE HCL SL Place into right nostril as needed.   Yes [provider]  Ketotifen Fumarate POWD 0.5 mg by Does not apply route. 6-12 times a day   Yes [provider]  L-Lysine 500 MG CAPS Take by mouth 3 (three) times daily.   Yes [provider]  levocetirizine (XYZAL ) 2.5 MG/5ML solution as needed. 04/11/21  Yes [provider]  lipase/protease/amylase (CREON ) 12000-38000 units CPEP capsule TAKE 1-3 CAPSULES (12,000-36,000 UNITS TOTAL) BY MOUTH 3 (THREE) TIMES DAILY BEFORE MEALS. PATIENT MAY TAKE AN ADDITIONAL 3 CAPSULES PER DAY FOR SNACK. 12/10/23  Yes Bacigalupo, Angela M, MD  Magnesium Citrate POWD Take 100 mg by mouth daily. 01/11/19  Yes [provider]  Menatetrenone (VITAMIN K2) 100 MCG TABS 100 mcg daily.   Yes [provider]  montelukast  (SINGULAIR ) 5 MG chewable tablet Chew 10 mg by mouth daily at 2 PM. 04/11/21  Yes [provider]  Non Gelatin Capsules, Empty, (CAPSULE #3 CLEAR/CLEAR VEG) CAPS Take 1 capsule by mouth daily. 11/16/19  Yes [provider]  NONFORMULARY OR COMPOUNDED ITEM Bupivacaine  nasal 0.5% drops w/o epi   Yes [provider]  omega-3 acid ethyl esters (LOVAZA ) 1 g capsule Take 1 capsule (1 g total) by mouth daily. 12/21/23  Yes Bacigalupo, Angela M, MD  PHOSPHATIDYL CHOLINE PO Take 1 tablet by mouth daily. 10/02/22  Yes [provider]  Phytonadione  (VITAMIN K1 ) POWD 100 mcg daily.   Yes [provider]  Riboflavin (VITAMIN B-2 PO) Take 100 mg by mouth 3 (three) times daily.   Yes [provider]  Selenium 200 MCG CAPS Take 1 capsule by mouth. Twice a month   Yes [provider]  simethicone (MYLICON) 80 MG chewable tablet Chew 80 mg by mouth  every 6 (six) hours as needed for flatulence.   Yes [provider]  sucralfate (CARAFATE) 1 GM/10ML suspension Take 10 mLs (1 g total) by mouth 4 (four) times daily -  with meals and at bedtime. 07/14/24  Yes Bacigalupo, Jon HERO, MD  Ubrogepant  (UBRELVY ) 50 MG TABS Take 1 tablet (50 mg total) by mouth as needed (for migriane). May repeat a dose in 2 hours if headache persists 12/21/23  Yes Bacigalupo, Angela M, MD  vitamin C (ASCORBIC ACID) 500 MG tablet Take 500 mg by mouth daily. With calcium   Yes [provider]  Vitamin D , Cholecalciferol, 10 MCG (400 UNIT) TABS Take 1,000 Int'l Units/day by mouth daily.  Yes [provider]  acetaminophen  (TYLENOL ) 500 MG tablet Take 500 mg by mouth every 6 (six) hours as needed.    [provider]  albuterol  (PROVENTIL  HFA;VENTOLIN  HFA) 108 (90 Base) MCG/ACT inhaler Inhale 2 puffs into the lungs every 4 (four) hours as needed.  09/19/16   [provider]  azelastine (ASTELIN) 0.1 % nasal spray Place into both nostrils as needed for rhinitis. Use in each nostril as directed    [provider]  Blood Pressure Monitoring (BLOOD PRESSURE CUFF) MISC Take blood pressure as needed for symptoms. 06/07/18   [provider]  Diclofenac  Sodium 1.5 % SOLN Apply topically QID prn 02/25/24   Bacigalupo, Angela M, MD  diphenhydrAMINE -Allantoin 2-0.5 % CREA Apply 1 application topically as needed.     [provider]  Epinastine HCl 0.05 % ophthalmic solution Apply 1 drop to eye 2 (two) times daily. 12/16/23   [provider]  EPINEPHrine  0.3 mg/0.3 mL IJ SOAJ injection Inject 0.3 mg into the muscle as needed for anaphylaxis. INJECT ONE AUTO-INJECTOR INTRAMUSCULARLY AS NEEDED 06/22/23   Bacigalupo, Jon HERO, MD  Estradiol  (YUVAFEM ) 10 MCG TABS vaginal tablet Place 1 tablet (10 mcg total) vaginally 2 (two) times a week. 05/02/24   Copland, Alicia B, PA-C  Light Mineral Oil-Mineral Oil 0.5-0.5 % EMUL Apply  1 application to eye daily.    [provider]  Melatonin 1 MG/ML LIQD Take 1 mg by mouth daily as needed. 10/16/17   [provider]  nystatin  cream (MYCOSTATIN ) Apply 1 Application topically 2 (two) times daily. 05/17/24   Janit Thresa HERO, DPM  Sodium Fluoride  1.1 % PSTE Place onto teeth.    [provider]  trolamine salicylate (ASPERCREME/ALOE) 10 % cream Apply topically.    [provider]  ZORYVE  0.3 % CREA APPLY TO ELBOWS ONCE DAILY. 07/27/23   Jackquline Sawyer, MD    Allergies as of 07/14/2024 - Review Complete 07/05/2024  Allergen Reaction Noted   Iodinated contrast media Hives, Itching, Palpitations, Photosensitivity, and Shortness Of Breath 12/13/2020   Ioversol Anxiety, Hives, Itching, and Palpitations 12/05/2016   Ciprofloxacin Other (See Comments) 01/12/2018   Levofloxacin Other (See Comments) 01/12/2018   Other Other (See Comments) 12/17/2017   Soy allergy (obsolete)  12/17/2017   Soybean-containing drug products Other (See Comments) 12/17/2017   Bee pollen  08/11/2018   Bupropion  08/11/2018   Celebrex  [celecoxib ]  08/11/2018   Codeine  08/11/2018   Dexilant [dexlansoprazole]  08/11/2018   Doxepin  09/05/2022   Dulera [mometasone furo-formoterol fum]  08/11/2018   Fentanyl  08/11/2018   Fluticasone  08/11/2018   Gabapentin  08/11/2018   Gluten meal  08/11/2018   Histidine  09/26/2021   Lidocaine   08/11/2018   Lyrica [pregabalin]  08/11/2018   Mold extract [trichophyton]  08/11/2018   Morphine and codeine  08/11/2018   Penicillins  08/11/2018   Pollen extract  08/11/2018   Polysorbate [sorbitan]  09/26/2021   Prilosec [omeprazole ]  08/11/2018   Rizatriptan  Other (See Comments) 04/14/2019   Salmeterol xinafoate  02/27/2012   Silenor [doxepin hcl]  08/11/2018   Stevioside Other (See Comments) 03/15/2020   Tomato  08/11/2018   Xylitol Other (See Comments) 03/15/2020   Brewer's dried yeast [yeast (saccharomyces cerevisiae)] Other (See  Comments) 06/11/2021   Ethanol Dermatitis, Other (See Comments), Itching, and Palpitations 05/31/2019   Hydrocodone Nausea Only 09/03/2018   Ibuprofen Nausea And Vomiting 02/27/2012   Lanolin Other (See Comments) 12/09/2017   Morphine  Nausea Only 08/11/2018   Wheat Diarrhea 11/13/2014   Yeast-derived drug products Other (See Comments) 06/11/2021    Family History  Problem Relation Age of Onset   Colon cancer Father 43   Cancer Father    Early death Father    Breast cancer Maternal Aunt 50       BRCA neg   Endometrial cancer Maternal Aunt 50   Cancer Maternal Aunt    Obesity Maternal Aunt    Colon cancer Maternal Grandmother 6   Migraines Maternal Grandmother    Tremor Maternal Grandmother    Cancer Maternal Grandmother    Varicose Veins Maternal Grandmother    Leukemia Maternal Grandfather 40   Food Allergy Maternal Grandfather    Cancer Maternal Grandfather    Heart disease Maternal Grandfather    Migraines Mother    Glaucoma Mother    Cataracts Mother    Tremor Mother    Fibromyalgia Mother    Heart disease Mother    ADD / ADHD Mother    Anxiety disorder Mother    Arthritis Mother    Hearing loss Mother    Hyperlipidemia Mother    Hypertension Mother    Varicose Veins Mother    Colon polyps Brother        non-cancerous   ADD / ADHD Brother     Social History   Socioeconomic History   Marital status: Married    Spouse name: Not on file   Number of children: 0   Years of education: Not on file   Highest education level: Master's degree (e.g., MA, MS, MEng, MEd, MSW, MBA)  Occupational History   Occupation: SSI and disability  Tobacco Use   Smoking status: Former    Current packs/day: 0.00    Average packs/day: 0.1 packs/day for 5.0 years (0.5 ttl pk-yrs)    Types: Cigarettes    Quit date: 09/15/1994    Years since quitting: 29.8   Smokeless tobacco: Never   Tobacco comments:    Smoked a few cigarettes in high school and college, but never a regular  smoker  Vaping Use   Vaping status: Never Used  Substance and Sexual Activity   Alcohol use: Not Currently   Drug use: Not Currently    Types: Marijuana    Comment: Marijuana occasionally in my 20's   Sexual activity: Not Currently    Birth control/protection: Abstinence, Post-menopausal  Other Topics Concern   Not on file  Social History Narrative   Not on file   Social Drivers of Health   Financial Resource Strain: Medium Risk (07/03/2024)   Overall Financial Resource Strain (CARDIA)    Difficulty of Paying Living Expenses: Somewhat hard  Food Insecurity: No Food Insecurity (07/03/2024)   Hunger Vital Sign    Worried About Running Out of Food in the Last Year: Never true    Ran Out of Food in the Last Year: Never true  Transportation Needs: Unmet Transportation Needs (07/03/2024)   PRAPARE - Transportation    Lack of Transportation (Medical): Yes    Lack of Transportation (Non-Medical): Yes  Physical Activity: Sufficiently Active (07/03/2024)   Exercise Vital Sign    Days of Exercise per Week: 7 days    Minutes of Exercise per Session: 30 min  Stress: Stress Concern Present (07/03/2024)   [employer] of Occupational Health - Occupational Stress Questionnaire    Feeling of Stress: To some extent  Social Connections: Socially Integrated (07/03/2024)   Social Connection and Isolation Panel  Frequency of Communication with Friends and Family: Three times a week    Frequency of Social Gatherings with Friends and Family: Once a week    Attends Religious Services: More than 4 times per year    Active Member of [employer] or Organizations: Yes    Attends Engineer, Structural: More than 4 times per year    Marital Status: Married  Catering Manager Violence: Not on file    Review of Systems: See HPI, otherwise negative ROS  Physical Exam: BP 129/78   Pulse 71   Temp 97.6 F (36.4 C) (Temporal)   Resp 18   Ht 5' 2 (1.575 m)   Wt 63 kg   SpO2 100%   BMI  25.42 kg/m  General:   Alert,  pleasant and cooperative in NAD Head:  Normocephalic and atraumatic. Neck:  Supple; no masses or thyromegaly. Lungs:  Clear throughout to auscultation.    Heart:  Regular rate and rhythm. Abdomen:  Soft, nontender and nondistended. Normal bowel sounds, without guarding, and without rebound.   Neurologic:  Alert and  oriented x4;  grossly normal neurologically.  Impression/Plan: Marcedes ARIYANA FAW is here for an endoscopy to be performed for dysphagia  Risks, benefits, limitations, and alternatives regarding  endoscopy have been reviewed with the patient.  Questions have been answered.  All parties agreeable.   Autumn Brooklyn, MD  07/20/2024, 9:12 AM

## 2024-07-20 NOTE — OR Nursing (Signed)
 Waiting on xray for KUB before discharge to home

## 2024-07-20 NOTE — Transfer of Care (Addendum)
 Immediate Anesthesia Transfer of Care Note  Patient: Autumn Zhang  Procedure(s) Performed: EGD (ESOPHAGOGASTRODUODENOSCOPY)  Patient Location: Endoscopy Unit  Anesthesia Type:General  Level of Consciousness: drowsy  Airway & Oxygen Therapy: Patient Spontanous Breathing  Post-op Assessment: Report given to RN and Post -op Vital signs reviewed and stable  Post vital signs: Reviewed and stable  Last Vitals:  Vitals Value Taken Time  BP 117/69 07/20/24 11:39  Temp 36.1 C 07/20/24 11:39  Pulse 64 07/20/24 11:39  Resp 15 07/20/24 11:39  SpO2 100 % 07/20/24 11:39  Vitals shown include unfiled device data.  Last Pain:  Vitals:   07/20/24 1139  TempSrc: Temporal  PainSc: 0-No pain         Complications: No notable events documented.

## 2024-07-21 ENCOUNTER — Ambulatory Visit: Admitting: Family Medicine

## 2024-07-21 ENCOUNTER — Encounter: Payer: Self-pay | Admitting: Family Medicine

## 2024-07-21 LAB — SURGICAL PATHOLOGY

## 2024-07-22 ENCOUNTER — Ambulatory Visit: Payer: Self-pay | Admitting: Gastroenterology

## 2024-07-26 MED ORDER — PANCRELIPASE (LIP-PROT-AMYL) 12000-38000 UNITS PO CPEP: ORAL_CAPSULE | ORAL | 11 refills | Status: DC

## 2024-07-27 ENCOUNTER — Other Ambulatory Visit: Payer: Self-pay | Admitting: Dermatology

## 2024-07-27 DIAGNOSIS — L409 Psoriasis, unspecified: Secondary | ICD-10-CM

## 2024-07-29 ENCOUNTER — Other Ambulatory Visit

## 2024-08-25 ENCOUNTER — Ambulatory Visit (INDEPENDENT_AMBULATORY_CARE_PROVIDER_SITE_OTHER): Admitting: Internal Medicine

## 2024-08-25 ENCOUNTER — Encounter (HOSPITAL_BASED_OUTPATIENT_CLINIC_OR_DEPARTMENT_OTHER): Payer: Self-pay | Admitting: Internal Medicine

## 2024-08-25 VITALS — BP 116/74 | HR 65 | Ht 62.0 in | Wt 138.8 lb

## 2024-08-25 DIAGNOSIS — Z13220 Encounter for screening for lipoid disorders: Secondary | ICD-10-CM

## 2024-08-25 DIAGNOSIS — R7301 Impaired fasting glucose: Secondary | ICD-10-CM

## 2024-08-25 DIAGNOSIS — E7211 Homocystinuria: Secondary | ICD-10-CM

## 2024-08-25 DIAGNOSIS — Z1589 Genetic susceptibility to other disease: Secondary | ICD-10-CM

## 2024-08-25 DIAGNOSIS — K8681 Exocrine pancreatic insufficiency: Secondary | ICD-10-CM | POA: Diagnosis not present

## 2024-08-25 DIAGNOSIS — E7800 Pure hypercholesterolemia, unspecified: Secondary | ICD-10-CM | POA: Diagnosis not present

## 2024-08-25 DIAGNOSIS — R7982 Elevated C-reactive protein (CRP): Secondary | ICD-10-CM

## 2024-08-25 DIAGNOSIS — R7989 Other specified abnormal findings of blood chemistry: Secondary | ICD-10-CM | POA: Diagnosis not present

## 2024-08-25 NOTE — Patient Instructions (Signed)
 Labs ordered today per patient requested by paperwork brought in today: GlycA Homocysteine Vit K1 Coenzyme Q10, total Apolipoprotein A-1 MMP-9 Fibrinogen Eval  Thrombin-Antithrombin Complex

## 2024-08-26 NOTE — Progress Notes (Signed)
 LIPID CLINIC CONSULT NOTE  Chief Complaint:  Manage dyslipidemia  Primary Care Physician: Myrla Jon HERO, MD  Primary Cardiologist:  None  HPI:  Autumn Zhang is a 53 y.o. female who is being seen today for the evaluation of dyslipidemia at the request of Bacigalupo, Jon HERO, MD. this is a 53 year old female kindly referred for management of dyslipidemia.  She has a history of dysautonomia, previously seen by Dr. Fernande, multiple other medical problems and high cholesterol.  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  Recent labs showed total cholesterol 193, HDL 77, triglycerides 65 and LDL 114.  It was recommended that she start on statin therapy but she had hesitancy based on her review of the literature.  She is now interested in additional inflammatory testing to see if she might be at increased risk of cardiovascular disease.  She did have a calcium score in 2024 which was 0.  PMHx:  Past Medical History:  Diagnosis Date   Allergy    Anemia    Anxiety    Arthritis    Asthma    Attention deficit hyperactivity disorder (ADHD), combined type 10/18/2015   Diagnosed many years ago.  Has been doing well on Ritalin  5mg  QID to six times a day. Long acting hasn't worked well in the past and she has not wanted to try Adderall. She is taking wellbutrin as well, but she has been weaning medication due to SE migraine.  She is now down to Wellbutrin one quarter tablet daily.  Last Assessment & Plan:  A/P: Stable.  Continue current dosing.  Refilled 3 months   Basal cell carcinoma 05/16/2021   Superficial, R post lower leg. [facility] [place], [place]   Basal cell carcinoma 09/03/2006   L chest, R forearm. 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   Dysautonomia (HCC)    Dysplastic  nevus 05/28/2009   right lateral back, [facility] [place], [place]   Dysplastic nevus 01/13/2023   left lower calf, mod to severe, shave removal 03/16/23, margins free 03/16/23   Ehlers-Danlos syndrome type III    Family history of colon cancer    GERD (gastroesophageal reflux disease)    Hyperlipidemia    Idiopathic small fiber peripheral neuropathy    Leiomyoma    Mast cell activation    Migraines    Neck pain    Occipital neuralgia    Osteoporosis 2025   fragility break of wrist after fall   Sleep apnea     Past Surgical History:  Procedure Laterality Date   APPENDECTOMY     removed with colectomy   BREAST SURGERY     reduction   COLON SURGERY  2008   for colon cancer, ~1/3 of colon removed, primary reanastimosis   COLONOSCOPY WITH PROPOFOL  N/A 10/07/2018   Procedure: COLONOSCOPY WITH PROPOFOL ;  Surgeon: Unk Corinn Skiff, MD;  Location: [facility] ENDOSCOPY;  Service: Gastroenterology;  Laterality: N/A;   COLONOSCOPY WITH PROPOFOL  N/A 08/14/2021   Procedure: COLONOSCOPY WITH PROPOFOL ;  Surgeon: Unk Corinn Skiff, MD;  Location: [facility] ENDOSCOPY;  Service: Gastroenterology;  Laterality: N/A;   ENDOMETRIAL ABLATION     ESOPHAGOGASTRODUODENOSCOPY N/A 07/20/2024   Procedure: EGD (ESOPHAGOGASTRODUODENOSCOPY);  Surgeon: Unk Corinn Skiff, MD;  Location: [facility] ENDOSCOPY;  Service: Gastroenterology;  Laterality: N/A;   trigeminal stimulator      FAMHx:  Family History  Problem Relation Age of Onset   Colon cancer Father 16   Cancer Father    Early death Father    Breast cancer Maternal Aunt 50       BRCA neg   Endometrial cancer Maternal Aunt 50   Cancer Maternal Aunt    Obesity Maternal Aunt    Colon cancer Maternal Grandmother 79   Migraines Maternal Grandmother    Tremor Maternal Grandmother    Cancer Maternal Grandmother    Varicose Veins Maternal Grandmother    Leukemia Maternal Grandfather 64   Food Allergy Maternal Grandfather    Cancer Maternal Grandfather     Heart disease Maternal Grandfather    Migraines Mother    Glaucoma Mother    Cataracts Mother    Tremor Mother    Fibromyalgia Mother    Heart disease Mother    ADD / ADHD Mother    Anxiety disorder Mother    Arthritis Mother    Hearing loss Mother    Hyperlipidemia Mother    Hypertension Mother    Varicose Veins Mother    Colon polyps Brother        non-cancerous   ADD / ADHD Brother     SOCHx:   reports that she quit smoking about 29 years ago. Her smoking use included cigarettes. She has a 0.5 pack-year smoking history. She has been exposed to tobacco smoke. She has never used smokeless tobacco. She reports that she does not currently use alcohol. She reports that she does not currently use drugs after having used the following drugs: Marijuana.  ALLERGIES:  Allergies[1]  ROS: Pertinent items noted in HPI and remainder of comprehensive ROS otherwise negative.  HOME MEDS: Medications Ordered Prior to Encounter[2]  LABS/IMAGING: No results found for this or any previous visit (from the past 48 hours). No results found.  LIPID PANEL:    Component Value Date/Time   CHOL 193 06/29/2024 0708   TRIG 77 06/29/2024 0708   HDL 65 06/29/2024 0708   CHOLHDL 3.0 06/29/2024 0708   CHOLHDL 2.6 05/17/2019 0755   VLDL 5 05/17/2019 0755   LDLCALC 114 (H) 06/29/2024 0708    Lipoprotein (a)  Date/Time Value Ref Range Status  06/29/2024 07:08 AM 215.0 (H) <75.0 nmol/L Final    Comment:    Note:  Values greater than or equal to 75.0 nmol/L may        indicate an independent risk factor for CHD,        but must be evaluated with caution when applied        to non-Caucasian populations due to the        influence of genetic factors on Lp(a) across        ethnicities.      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  Autumn Zhang has a dyslipidemia but no coronary calcium.  She did have a high LP(a) which is a risk enhancing and therefore I would suggest to target LDL less than 70.  Statin therapy was recommended but she was hesitant to that.  She is interested in further testing and provided a list of blood test that she would like checked today.  Will try to accommodate her request for that.  This may help her further decide whether she would be agreeable to going onto therapy or not.  Vinie KYM Maxcy, MD, [facility], FNLA, FACP  [place]  [facility]  Medical Director of the [facility] Lipid [facility] &  Cardiovascular Risk Reduction Clinic Diplomate of the American Board of Clinical Lipidology Attending Cardiologist  Direct Dial: [phone]  Fax: [phone]  Website:  [email or web address]  Vinie BROCKS Juddson Cobern 08/26/2024, 10:02 PM     [1]  Allergies Allergen Reactions   Iodinated Contrast Media Hives, Itching, Palpitations, Photosensitivity and Shortness Of Breath    Other reaction(s): Headache Other reaction(s): Headache   Ioversol Anxiety, Hives, Itching and Palpitations   Nsaids Nausea Only   Ciprofloxacin Other (See Comments)   Levofloxacin Other (See Comments)    Congestion & Headaches Congestion & Headaches    Other Other (See Comments)    Novocaine (IV) form  Hearing loss  Congestion & Headaches  Other reaction(s): Unknown  Other Reaction(s): Not available   Soy Allergy (Obsolete)     Other reaction(s): Other (See Comments), Unknown Congestion & Headaches Congestion & Headaches   Soybean-Containing Drug Products Other (See Comments)    Congestion & Headaches     Bee Pollen    Bupropion    Celebrex  [Celecoxib ]    Codeine    Dexilant [Dexlansoprazole]    Doxepin     Other Reaction(s): Unknown  Other Reaction(s): Not available   Dulera [Mometasone Furo-Formoterol Fum]    Fentanyl    Fluticasone     Inhaled into lungs it does not do well   Gabapentin    Gluten Meal    Histidine    Lidocaine      Rash to topical cream Flushing and redness   Lyrica [Pregabalin]    Morphine And Codeine    Penicillins     Skin testing positive for allergy   Pollen Extract    Polysorbate 20     Other Reaction(s): Not available   Polysorbate [Sorbitan]    Prilosec [Omeprazole ]    Procaine     Other Reaction(s): Unknown   Rizatriptan  Other (See Comments)    Dizziness, headache, myalgias, nausea   Salmeterol Xinafoate    Silenor [Doxepin Hcl]    Stevioside Other (See Comments)   Tomato    Trichophyton     Other Reaction(s): Not available  Trichophyton mentagrophytes allergenic extract   Xylitol Other (See Comments)   Brewer's Dried Yeast [Yeast (Saccharomyces Cerevisiae)] Other (See Comments)    Headache, flushing   Ethanol Dermatitis, Other (See Comments), Itching and Palpitations   Hydrocodone Nausea Only   Ibuprofen Nausea And Vomiting    At high doses At high doses    Lanolin Other (See Comments)    Exacerbates neuralgia symptoms Wool causes hives.    Morphine Nausea Only   Wheat Diarrhea   Yeast-Derived Drug Products Other (See Comments)    Headache, flushing  [2]  Current Outpatient Medications on File Prior to Visit  Medication Sig Dispense Refill   acetaminophen  (TYLENOL ) 500 MG tablet Take 500 mg by mouth every 6 (six)  hours as needed.     albuterol  (PROVENTIL  HFA;VENTOLIN  HFA) 108 (90 Base) MCG/ACT inhaler Inhale 2 puffs into the lungs every 4 (four) hours as needed.      aspirin 325 MG tablet Take 325 mg by mouth. Every 3 days     Atogepant  (QULIPTA ) 10 MG TABS Take 1 tablet by mouth daily. 90 tablet 1   azelastine (ASTELIN)  0.1 % nasal spray Place into both nostrils as needed for rhinitis. Use in each nostril as directed     Blood Pressure Monitoring (BLOOD PRESSURE CUFF) MISC Take blood pressure as needed for symptoms.     Boswellia Serrata (BOSWELLIA PO) Take 150 mg by mouth every morning.      Calcium Citrate 200 MG TABS Take 600 mg by mouth. powder     cromolyn  (GASTROCROM ) 100 MG/5ML solution Take by mouth 4 (four) times daily -  before meals and at bedtime.     Cyanocobalamin (VITAMIN B-12) 1000 MCG SUBL Place 500 mcg under the tongue daily.     Diclofenac  Sodium 1.5 % SOLN Apply topically QID prn 150 mL 11   diphenhydrAMINE -Allantoin 2-0.5 % CREA Apply 1 application topically as needed.      docusate sodium (COLACE) 100 MG capsule Take 100 mg by mouth every other day.      Epinastine HCl 0.05 % ophthalmic solution Apply 1 drop to eye 2 (two) times daily.     EPINEPHrine  0.3 mg/0.3 mL IJ SOAJ injection Inject 0.3 mg into the muscle as needed for anaphylaxis. INJECT ONE AUTO-INJECTOR INTRAMUSCULARLY AS NEEDED 2 each 5   Estradiol  (YUVAFEM ) 10 MCG TABS vaginal tablet Place 1 tablet (10 mcg total) vaginally 2 (two) times a week. 24 tablet 2   famotidine  (PEPCID ) 10 MG tablet Take 10 mg by mouth daily.      fexofenadine (ALLEGRA) 180 MG tablet Take 180 mg by mouth 2 (two) times daily.     Folic Acid  (FOLATE PO) Take 0.5 tablets by mouth daily. Folate as calcium foliate 680 mcg DFE     guaifenesin (HUMIBID E) 400 MG TABS tablet Take 400 mg by mouth as needed.     hydrOXYzine  (ATARAX ) 10 MG tablet Take 10 mg by mouth 5 (five) times daily as needed for itching (Hives).     IODINE EX Take 225 mcg by mouth. Every 3 days     KETAMINE HCL SL Place into right nostril as needed.     Ketotifen Fumarate POWD 0.5 mg by Does not apply route. 6-12 times a day     L-Lysine 500 MG CAPS Take by mouth 3 (three) times daily.     levocetirizine (XYZAL ) 2.5 MG/5ML solution as needed.     Light Mineral Oil-Mineral Oil 0.5-0.5 % EMUL  Apply 1 application to eye daily.     lipase/protease/amylase (CREON ) 12000-38000 units CPEP capsule TAKE 2-7 CAPSULES BY MOUTH 3 (THREE) TIMES DAILY BEFORE MEALS. PATIENT MAY TAKE AN ADDITIONAL 1-4 CAPSULES PER DAY FOR SNACK. 500 capsule 11   Magnesium Citrate POWD Take 100 mg by mouth daily.     Melatonin 1 MG/ML LIQD Take 1 mg by mouth daily as needed.     Menatetrenone (VITAMIN K2) 100 MCG TABS 100 mcg daily.     montelukast  (SINGULAIR ) 5 MG chewable tablet Chew 10 mg by mouth daily at 2 PM.     Non Gelatin Capsules, Empty, (CAPSULE #3 CLEAR/CLEAR VEG) CAPS Take 1 capsule by mouth daily.     NONFORMULARY OR COMPOUNDED ITEM  Bupivacaine  nasal 0.5% drops w/o epi     nystatin  cream (MYCOSTATIN ) Apply 1 Application topically 2 (two) times daily. 30 g 2   omega-3 acid ethyl esters (LOVAZA ) 1 g capsule Take 1 capsule (1 g total) by mouth daily. 90 capsule 3   PHOSPHATIDYL CHOLINE PO Take 1 tablet by mouth daily.     Phytonadione  (VITAMIN K1 ) POWD 100 mcg daily.     pyRIDostigmine  Bromide 30 MG TABS Take by mouth. 0.25 (1/4) tablet by mouth 5-6 times a day     Riboflavin (VITAMIN B-2 PO) Take 100 mg by mouth 3 (three) times daily.     Selenium 200 MCG CAPS Take 1 capsule by mouth. Twice a month     simethicone (MYLICON) 80 MG chewable tablet Chew 80 mg by mouth every 6 (six) hours as needed for flatulence.     Sodium Fluoride  1.1 % PSTE Place onto teeth.     trolamine salicylate (ASPERCREME/ALOE) 10 % cream Apply topically.     Ubrogepant  (UBRELVY ) 50 MG TABS Take 1 tablet (50 mg total) by mouth as needed (for migriane). May repeat a dose in 2 hours if headache persists 16 tablet 6   vitamin C (ASCORBIC ACID) 500 MG tablet Take 500 mg by mouth daily. With calcium     Vitamin D , Cholecalciferol, 10 MCG (400 UNIT) TABS Take 1,000 Int'l Units/day by mouth daily.     ZORYVE  0.3 % CREA APPLY TO ELBOWS ONCE DAILY. 60 g 2   No current facility-administered medications on file prior to visit.

## 2024-08-27 LAB — CORTISOL: Cortisol: 10.8 ug/dL (ref 6.2–19.4)

## 2024-08-29 ENCOUNTER — Ambulatory Visit: Payer: Self-pay | Admitting: Family Medicine

## 2024-08-31 ENCOUNTER — Ambulatory Visit (HOSPITAL_COMMUNITY): Admitting: Physician Assistant

## 2024-08-31 ENCOUNTER — Encounter: Payer: Self-pay | Admitting: *Deleted

## 2024-09-02 LAB — GLYCA: GlycA: 443 umol/L — ABNORMAL HIGH

## 2024-09-02 LAB — VITAMIN K1, SERUM: VITAMIN K1: 1.22 ng/mL (ref 0.10–2.20)

## 2024-09-02 LAB — ANTITHROMBIN PANEL
AT III AG PPP IMM-ACNC: 95 % (ref 72–124)
AntiThromb III Func: 135 % (ref 75–135)

## 2024-09-02 LAB — FIBRINOGEN EVALUATION PROFILE III (ESOTERIX)
Fibrinogen Activity: 527 mg/dL — ABNORMAL HIGH
Fibrinogen Antigen: 633 mg/dL — ABNORMAL HIGH
Reptilase Time: 18.5 s
Thrombin Time: 18 s

## 2024-09-02 LAB — APOLIPOPROTEIN A-1: Apolipoprotein A-1: 172 mg/dL (ref 116–209)

## 2024-09-02 LAB — COENZYME Q10, TOTAL: Coenzyme Q10, Total: 0.59 ug/mL (ref 0.37–2.20)

## 2024-09-02 LAB — HIGH SENSITIVITY CRP: CRP, High Sensitivity: 1.28 mg/L (ref 0.00–3.00)

## 2024-09-02 LAB — HOMOCYSTEINE: Homocysteine: 5.7 umol/L (ref 0.0–14.5)

## 2024-09-05 ENCOUNTER — Ambulatory Visit: Payer: Self-pay | Admitting: Internal Medicine

## 2024-09-06 MED ORDER — PRAVASTATIN SODIUM 20 MG PO TABS: 20.0000 mg | ORAL_TABLET | Freq: Every evening | ORAL | 3 refills | Status: DC

## 2024-09-09 ENCOUNTER — Encounter: Payer: Self-pay | Admitting: Dermatology

## 2024-09-23 ENCOUNTER — Encounter: Payer: Self-pay | Admitting: Family Medicine

## 2024-09-26 MED ORDER — EUCRISA 2 % EX OINT: 1.0000 | TOPICAL_OINTMENT | Freq: Two times a day (BID) | CUTANEOUS | 2 refills | Status: DC

## 2024-09-27 ENCOUNTER — Other Ambulatory Visit: Payer: Self-pay | Admitting: Dermatology

## 2024-09-27 MED ORDER — PRAVASTATIN SODIUM 20 MG PO TABS: 20.0000 mg | ORAL_TABLET | ORAL | 3 refills | Status: DC

## 2024-10-15 ENCOUNTER — Other Ambulatory Visit: Payer: Self-pay | Admitting: Neurology

## 2024-10-17 ENCOUNTER — Other Ambulatory Visit: Payer: Self-pay | Admitting: Family Medicine

## 2024-10-17 NOTE — Telephone Encounter (Signed)
 Requesting: Atogepant  (QULIPTA ) 10 MG TABS  Last Visit: 07/14/2024 Next Visit: 10/18/2024 Last Refill: 11/17/23 #90 1rf  Please Advise

## 2024-10-18 ENCOUNTER — Encounter: Payer: Self-pay | Admitting: Family Medicine

## 2024-10-18 ENCOUNTER — Telehealth: Admitting: Family Medicine

## 2024-10-18 DIAGNOSIS — E7800 Pure hypercholesterolemia, unspecified: Secondary | ICD-10-CM | POA: Diagnosis not present

## 2024-10-18 DIAGNOSIS — R739 Hyperglycemia, unspecified: Secondary | ICD-10-CM | POA: Diagnosis not present

## 2024-10-18 MED ORDER — PRAVASTATIN SODIUM 10 MG PO TABS: 10.0000 mg | ORAL_TABLET | ORAL | 3 refills | Status: AC

## 2024-10-18 MED ORDER — CROMOLYN SODIUM 4 % OP SOLN: 1.0000 [drp] | Freq: Four times a day (QID) | OPHTHALMIC | 12 refills | Status: AC

## 2024-10-18 MED ORDER — PANCRELIPASE (LIP-PROT-AMYL) 24000-76000 UNITS PO CPEP: ORAL_CAPSULE | ORAL | 11 refills | Status: AC

## 2024-10-28 ENCOUNTER — Other Ambulatory Visit

## 2024-10-28 ENCOUNTER — Ambulatory Visit: Admitting: Oncology

## 2024-12-26 ENCOUNTER — Ambulatory Visit: Admitting: Family Medicine

## 2025-01-31 ENCOUNTER — Encounter: Admitting: Dermatology
